# Patient Record
Sex: Female | Born: 1960
Health system: Southern US, Community
[De-identification: ages and names within clinical notes are randomized; demographics above are authoritative.]

## PROBLEM LIST (undated history)

## (undated) ENCOUNTER — Emergency Department: Discharge status: Absent without leave | Payer: Self-pay

## (undated) DIAGNOSIS — M170 Bilateral primary osteoarthritis of knee: Secondary | ICD-10-CM

## (undated) DIAGNOSIS — T7840XA Allergy, unspecified, initial encounter: Secondary | ICD-10-CM

## (undated) DIAGNOSIS — G43829 Menstrual migraine, not intractable, without status migrainosus: Secondary | ICD-10-CM

## (undated) DIAGNOSIS — J329 Chronic sinusitis, unspecified: Secondary | ICD-10-CM

## (undated) DIAGNOSIS — J45909 Unspecified asthma, uncomplicated: Secondary | ICD-10-CM

## (undated) DIAGNOSIS — K861 Other chronic pancreatitis: Secondary | ICD-10-CM

## (undated) DIAGNOSIS — L509 Urticaria, unspecified: Secondary | ICD-10-CM

## (undated) DIAGNOSIS — Z01810 Encounter for preprocedural cardiovascular examination: Secondary | ICD-10-CM

## (undated) DIAGNOSIS — R7303 Prediabetes: Secondary | ICD-10-CM

## (undated) DIAGNOSIS — K0530 Chronic periodontitis, unspecified: Secondary | ICD-10-CM

## (undated) DIAGNOSIS — I10 Essential (primary) hypertension: Secondary | ICD-10-CM

## (undated) HISTORY — DX: Encounter for preprocedural cardiovascular examination: Z01.810

## (undated) HISTORY — DX: Menstrual migraine, not intractable, without status migrainosus: G43.829

## (undated) HISTORY — DX: Chronic sinusitis, unspecified: J32.9

## (undated) HISTORY — DX: Other chronic pancreatitis: K86.1

## (undated) HISTORY — DX: Prediabetes: R73.03

## (undated) HISTORY — DX: Unspecified asthma, uncomplicated: J45.909

## (undated) HISTORY — DX: Allergy, unspecified, initial encounter: T78.40XA

## (undated) HISTORY — DX: Essential (primary) hypertension: I10

## (undated) HISTORY — DX: Bilateral primary osteoarthritis of knee: M17.0

---

## 1987-12-26 HISTORY — PX: TUBAL LIGATION: SHX77

## 2012-11-04 ENCOUNTER — Encounter (HOSPITAL_BASED_OUTPATIENT_CLINIC_OR_DEPARTMENT_OTHER): Payer: Self-pay | Admitting: *Deleted

## 2012-11-04 ENCOUNTER — Emergency Department
Admission: EM | Admit: 2012-11-04 | Discharge: 2012-11-04 | Disposition: A | Discharge status: Home / Self Care | Payer: Self-pay | Source: Home / Self Care

## 2012-11-04 ENCOUNTER — Emergency Department (HOSPITAL_BASED_OUTPATIENT_CLINIC_OR_DEPARTMENT_OTHER): Payer: Medicare Other

## 2012-11-04 ENCOUNTER — Emergency Department (HOSPITAL_BASED_OUTPATIENT_CLINIC_OR_DEPARTMENT_OTHER)
Admission: EM | Admit: 2012-11-04 | Discharge: 2012-11-04 | Disposition: A | Discharge status: Home / Self Care | Payer: Medicare Other | Attending: Emergency Medicine | Admitting: Emergency Medicine

## 2012-11-04 DIAGNOSIS — Z79899 Other long term (current) drug therapy: Secondary | ICD-10-CM | POA: Insufficient documentation

## 2012-11-04 DIAGNOSIS — R110 Nausea: Secondary | ICD-10-CM | POA: Insufficient documentation

## 2012-11-04 DIAGNOSIS — L509 Urticaria, unspecified: Secondary | ICD-10-CM | POA: Insufficient documentation

## 2012-11-04 DIAGNOSIS — R0789 Other chest pain: Secondary | ICD-10-CM | POA: Insufficient documentation

## 2012-11-04 DIAGNOSIS — R079 Chest pain, unspecified: Secondary | ICD-10-CM

## 2012-11-04 DIAGNOSIS — R61 Generalized hyperhidrosis: Secondary | ICD-10-CM | POA: Insufficient documentation

## 2012-11-04 HISTORY — DX: Urticaria, unspecified: L50.9

## 2012-11-04 LAB — CBC
HCT: 37.5 % (ref 36.0–46.0)
Hemoglobin: 12.8 g/dL (ref 12.0–15.0)
MCH: 29.8 pg (ref 26.0–34.0)
MCHC: 34.1 g/dL (ref 30.0–36.0)
MCV: 87.4 fL (ref 78.0–100.0)
Platelets: 359 10*3/uL (ref 150–400)
RBC: 4.29 MIL/uL (ref 3.87–5.11)
RDW: 14.4 % (ref 11.5–15.5)
WBC: 6.9 10*3/uL (ref 4.0–10.5)

## 2012-11-04 LAB — BASIC METABOLIC PANEL
BUN: 17 mg/dL (ref 6–23)
CO2: 25 mEq/L (ref 19–32)
Calcium: 9.3 mg/dL (ref 8.4–10.5)
Chloride: 103 mEq/L (ref 96–112)
Creatinine, Ser: 1.2 mg/dL — ABNORMAL HIGH (ref 0.50–1.10)
GFR calc Af Amer: 60 mL/min — ABNORMAL LOW (ref >90)
GFR calc non Af Amer: 52 mL/min — ABNORMAL LOW (ref >90)
Glucose, Bld: 99 mg/dL (ref 70–99)
Potassium: 4 mEq/L (ref 3.5–5.1)
Sodium: 139 mEq/L (ref 135–145)

## 2012-11-04 LAB — PREGNANCY, URINE: Preg Test, Ur: NEGATIVE

## 2012-11-04 LAB — URINALYSIS, ROUTINE W REFLEX MICROSCOPIC
Bilirubin Urine: NEGATIVE
Glucose, UA: NEGATIVE mg/dL
Ketones, ur: NEGATIVE mg/dL
Leukocytes, UA: NEGATIVE
Nitrite: NEGATIVE
Protein, ur: NEGATIVE mg/dL
Specific Gravity, Urine: 1.007 (ref 1.005–1.030)
Urobilinogen, UA: 0.2 mg/dL (ref 0.0–1.0)
pH: 6 (ref 5.0–8.0)

## 2012-11-04 LAB — URINE MICROSCOPIC-ADD ON

## 2012-11-04 LAB — TROPONIN I: Troponin I: <0.3 ng/mL (ref <0.30)

## 2012-11-04 MED ORDER — GI COCKTAIL ~~LOC~~
30.0000 mL | Freq: Once | ORAL | Status: AC
  Administered 2012-11-04: 30 mL via ORAL
  Filled 2012-11-04: qty 30

## 2012-11-04 NOTE — ED Notes (Signed)
Pt sent here from Urgent Care. C/O midsternal chest pressure x 2 weeks. Also feels same with urination and BM. Tingling left arm. Denies other s/s.

## 2012-11-04 NOTE — Discharge Instructions (Signed)
Chest Pain (Nonspecific) It is often hard to give a specific diagnosis for the cause of chest pain. There is always a chance that your pain could be related to something serious, such as a heart attack or a blood clot in the lungs. You need to follow up with your caregiver for further evaluation. CAUSES   Heartburn.  Pneumonia or bronchitis.  Anxiety or stress.  Inflammation around your heart (pericarditis) or lung (pleuritis or pleurisy).  A blood clot in the lung.  A collapsed lung (pneumothorax). It can develop suddenly on its own (spontaneous pneumothorax) or from injury (trauma) to the chest.  Shingles infection (herpes zoster virus). The chest wall is composed of bones, muscles, and cartilage. Any of these can be the source of the pain.  The bones can be bruised by injury.  The muscles or cartilage can be strained by coughing or overwork.  The cartilage can be affected by inflammation and become sore (costochondritis). DIAGNOSIS  Lab tests or other studies, such as X-rays, electrocardiography, stress testing, or cardiac imaging, may be needed to find the cause of your pain.  TREATMENT   Treatment depends on what may be causing your chest pain. Treatment may include:  Acid blockers for heartburn.  Anti-inflammatory medicine.  Pain medicine for inflammatory conditions.  Antibiotics if an infection is present.  You may be advised to change lifestyle habits. This includes stopping smoking and avoiding alcohol, caffeine, and chocolate.  You may be advised to keep your head raised (elevated) when sleeping. This reduces the chance of acid going backward from your stomach into your esophagus.  Most of the time, nonspecific chest pain will improve within 2 to 3 days with rest and mild pain medicine. HOME CARE INSTRUCTIONS   If antibiotics were prescribed, take your antibiotics as directed. Finish them even if you start to feel better.  For the next few days, avoid physical  activities that bring on chest pain. Continue physical activities as directed.  Do not smoke.  Avoid drinking alcohol.  Only take over-the-counter or prescription medicine for pain, discomfort, or fever as directed by your caregiver.  Follow your caregiver's suggestions for further testing if your chest pain does not go away.  Keep any follow-up appointments you made. If you do not go to an appointment, you could develop lasting (chronic) problems with pain. If there is any problem keeping an appointment, you must call to reschedule. SEEK MEDICAL CARE IF:   You think you are having problems from the medicine you are taking. Read your medicine instructions carefully.  Your chest pain does not go away, even after treatment.  You develop a rash with blisters on your chest. SEEK IMMEDIATE MEDICAL CARE IF:   You have increased chest pain or pain that spreads to your arm, neck, jaw, back, or abdomen.  You develop shortness of breath, an increasing cough, or you are coughing up blood.  You have severe back or abdominal pain, feel nauseous, or vomit.  You develop severe weakness, fainting, or chills.  You have a fever. THIS IS AN EMERGENCY. Do not wait to see if the pain will go away. Get medical help at once. Call your local emergency services (911 in [place] Do not drive yourself to the hospital. MAKE SURE YOU:   Understand these instructions.  Will watch your condition.  Will get help right away if you are not doing well or get worse. Document Released: 08/03/2005 Document Revised: 01/16/2012 Document Reviewed: 05/29/2008 Exit[employer] Patient Information 2013 [employer].  

## 2012-11-04 NOTE — ED Provider Notes (Signed)
History  This chart was scribed for Lyanne Co, MD by Ardeen Jourdain, ED Scribe. This patient was seen in room MH01/MH01 and the patient's care was started at 1603.  CSN: [id number]  Arrival date & time 11/04/12  1548   First MD Initiated Contact with Patient 11/04/12 1603      Chief Complaint  Patient presents with  . Chest Pain     The history is provided by the patient. No language interpreter was used.    Janet Richards is a 51 y.o. female who presents to the Emergency Department complaining of chest pressure with associated diaphoresis and nausea. She states the pain is intermittent and lasts a few seconds. She reports the pain started 2 weeks ago and has been gradually worsening. She states the pain feels "like someone is pushing on her chest" when she urinates and when she bends over. She reports the pain is also aggravated by walking, movement, sitting up and deep breaths. She does not have a family history of heart disease.    Past Medical History  Diagnosis Date  . Urticaria     History reviewed. No pertinent past surgical history.  History reviewed. No pertinent family history.  History  Substance Use Topics  . Smoking status: Never Smoker   . Smokeless tobacco: Not on file  . Alcohol Use: Yes   No OB history available.   Review of Systems  All other systems reviewed and are negative.   A complete 10 system review of systems was obtained and all systems are negative except as noted in the HPI and PMH.    Allergies  Erythromycin  Home Medications   Current Outpatient Rx  Name  Route  Sig  Dispense  Refill  . CONJ ESTROG-MEDROXYPROGEST ACE 0.625-2.5 MG PO TABS   Oral   Take 1 tablet by mouth daily.         Joyce Copa PO   Oral   Take by mouth.         [name] HYDROXYZINE HCL 10 MG PO TABS   Oral   Take 10 mg by mouth 3 (three) times daily as needed.         [name] LORATADINE 10 MG PO TABS   Oral   Take 10 mg by mouth daily.         [name]  OMEPRAZOLE 20 MG PO CPDR   Oral   Take 20 mg by mouth daily.           There were no vitals taken for this visit.  Physical Exam  Nursing note and vitals reviewed. Constitutional: She is oriented to person, place, and time. She appears well-developed and well-nourished. No distress.  HENT:  Head: Normocephalic and atraumatic.  Eyes: EOM are normal.  Neck: Normal range of motion.  Cardiovascular: Normal rate, regular rhythm and normal heart sounds.   Pulmonary/Chest: Effort normal and breath sounds normal.  Abdominal: Soft. Bowel sounds are normal. She exhibits no distension. There is no tenderness.  Musculoskeletal: Normal range of motion.  Neurological: She is alert and oriented to person, place, and time.  Skin: Skin is warm and dry.  Psychiatric: She has a normal mood and affect. Judgment normal.    ED Course  Procedures (including critical care time)  DIAGNOSTIC STUDIES:   COORDINATION OF CARE:   4:39 PM: Discussed treatment plan which includes blood work, an EKG and CXR with pt at bedside and pt agreed to plan.  Results for orders placed during  the hospital encounter of 11/04/12  URINALYSIS, ROUTINE W REFLEX MICROSCOPIC      Component Value Range   Color, Urine YELLOW  YELLOW   APPearance CLEAR  CLEAR   Specific Gravity, Urine 1.007  1.005 - 1.030   pH 6.0  5.0 - 8.0   Glucose, UA NEGATIVE  NEGATIVE mg/dL   Hgb urine dipstick LARGE (*) NEGATIVE   Bilirubin Urine NEGATIVE  NEGATIVE   Ketones, ur NEGATIVE  NEGATIVE mg/dL   Protein, ur NEGATIVE  NEGATIVE mg/dL   Urobilinogen, UA 0.2  0.0 - 1.0 mg/dL   Nitrite NEGATIVE  NEGATIVE   Leukocytes, UA NEGATIVE  NEGATIVE  PREGNANCY, URINE      Component Value Range   Preg Test, Ur NEGATIVE  NEGATIVE  URINE MICROSCOPIC-ADD ON      Component Value Range   Squamous Epithelial / LPF RARE  RARE   WBC, UA 0-2  <3 WBC/hpf   RBC / HPF 3-6  <3 RBC/hpf   Bacteria, UA RARE  RARE  CBC      Component Value Range   WBC 6.9   4.0 - 10.5 K/uL   RBC 4.29  3.87 - 5.11 MIL/uL   Hemoglobin 12.8  12.0 - 15.0 g/dL   HCT 16.1  09.6 - 04.5 %   MCV 87.4  78.0 - 100.0 fL   MCH 29.8  26.0 - 34.0 pg   MCHC 34.1  30.0 - 36.0 g/dL   RDW 40.9  81.1 - 91.4 %   Platelets 359  150 - 400 K/uL  BASIC METABOLIC PANEL      Component Value Range   Sodium 139  135 - 145 mEq/L   Potassium 4.0  3.5 - 5.1 mEq/L   Chloride 103  96 - 112 mEq/L   CO2 25  19 - 32 mEq/L   Glucose, Bld 99  70 - 99 mg/dL   BUN 17  6 - 23 mg/dL   Creatinine, Ser 7.82 (*) 0.50 - 1.10 mg/dL   Calcium 9.3  8.4 - 95.6 mg/dL   GFR calc non Af Amer 52 (*) >90 mL/min   GFR calc Af Amer 60 (*) >90 mL/min  TROPONIN I      Component Value Range   Troponin I <0.30  <0.30 ng/mL   Dg Chest 2 View  11/04/2012  *RADIOLOGY REPORT*  Clinical Data: Chest pain.  CHEST - 2 VIEW  Comparison: None  Findings: Heart and mediastinal contours are within normal limits. No focal opacities or effusions.  No acute bony abnormality.  IMPRESSION: No active cardiopulmonary disease.   Original Report Authenticated By: Charlett Nose, M.D.    I personally reviewed the imaging tests through PACS system I reviewed available ER/hospitalization records through the EMR   Date: 11/04/2012  Rate: 92  Rhythm: normal sinus rhythm  QRS Axis: normal  Intervals: normal  ST/T Wave abnormalities: normal  Conduction Disutrbances: none  Narrative Interpretation:   Old EKG Reviewed: No significant changes noted     1. Chest pain       MDM  The patient's symptoms are very atypical with intermittent chest discomfort is worse with bending over.  It seems more musculoskeletal in nature.  EKG normal.  Troponin normal.  Of all of her symptoms were resolved with GI cocktail.  Close PCP followup.  Doubt ACS.  Doubt PE. PERC negative      I personally performed the services described in this documentation, which was scribed in my presence. The  recorded information has been reviewed and is  accurate.      Lyanne Co, MD 11/04/12 [phone]

## 2012-12-22 ENCOUNTER — Encounter: Payer: Self-pay | Admitting: *Deleted

## 2012-12-22 ENCOUNTER — Emergency Department
Admission: EM | Admit: 2012-12-22 | Discharge: 2012-12-22 | Disposition: A | Discharge status: Home / Self Care | Payer: Medicare Other | Source: Home / Self Care | Attending: Emergency Medicine | Admitting: Emergency Medicine

## 2012-12-22 ENCOUNTER — Emergency Department (INDEPENDENT_AMBULATORY_CARE_PROVIDER_SITE_OTHER): Payer: Medicare Other

## 2012-12-22 DIAGNOSIS — M25569 Pain in unspecified knee: Secondary | ICD-10-CM

## 2012-12-22 DIAGNOSIS — M25561 Pain in right knee: Secondary | ICD-10-CM

## 2012-12-22 MED ORDER — PREDNISONE (PAK) 10 MG PO TABS: ORAL_TABLET | ORAL | Status: DC

## 2012-12-22 NOTE — ED Notes (Signed)
Pt has had rt Knee pain for 2 days no injury.  Pt describes pain as a ton off bricks and heavy.  Pt gives 10/10 pain.  Has used ice and heat with no relief

## 2012-12-22 NOTE — ED Provider Notes (Addendum)
History     CSN: [id number]  Arrival date & time 12/22/12  1708   First MD Initiated Contact with Patient 12/22/12 1734      Chief Complaint  Patient presents with  . Knee Pain    Rt    Patient is a 52 y.o. female presenting with knee pain. The history is provided by the patient.  Knee Pain Location:  Knee Time since incident: Recalls no injury, complains of 2 days of swollen painful right knee. Injury: no   Knee location:  R knee Pain details:    Quality:  Dull and pressure   Radiates to:  Does not radiate   Severity:  Severe   Onset quality:  Gradual   Duration:  2 days   Timing:  Constant   Progression:  Worsening Chronicity:  New (This is new for the right knee, but had similar symptoms on left knee 3 weeks ago, was given a cortisone shot by another physician in left knee 3 weeks ago and that helped her left knee.) Dislocation: no   Foreign body present:  No foreign bodies Prior injury to area:  No Relieved by:  Rest Worsened by:  Activity, bearing weight and flexion Ineffective treatments:  Ice and heat Associated symptoms: decreased ROM, stiffness and swelling   Associated symptoms: no back pain, no fever, no itching, no numbness and no tingling   Risk factors: no frequent fractures and no known bone disorder    Right knee pain is 10 out of a possible 10.-- To her knowledge, she has never been diagnosed with gout, but she feels she could have gout because she has occasional arthralgias in knees and feet and ankles for the past few years.  Drug allergies: Aspirin or any NSAID. Erythromycin. Penicillin (she states she can take amoxicillin without any problems or side effects) Past Medical History  Diagnosis Date  . Urticaria     Past Surgical History  Procedure Laterality Date  . Tubal ligation      History reviewed. No pertinent family history.  History  Substance Use Topics  . Smoking status: Never Smoker   . Smokeless tobacco: Not on file  . Alcohol  Use: Yes    OB History   Grav Para Term Preterm Abortions TAB SAB Ect Mult Living                  Review of Systems  Constitutional: Negative for fever, chills and appetite change.  HENT: Negative.   Eyes: Negative.   Respiratory: Negative.   Cardiovascular: Negative.   Gastrointestinal: Negative.   Genitourinary: Negative.   Musculoskeletal: Positive for stiffness. Negative for back pain.  Skin: Negative for itching.  Neurological: Negative.   Hematological: Negative.   Psychiatric/Behavioral: Negative.     Allergies  Aspirin and Erythromycin  Home Medications   Current Outpatient Rx  Name  Route  Sig  Dispense  Refill  . fluticasone (FLONASE) 50 MCG/ACT nasal spray   Nasal   Place 2 sprays into the nose daily.         [name] estrogen, conjugated,-medroxyprogesterone (PREMPRO) 0.625-2.5 MG per tablet   Oral   Take 1 tablet by mouth daily.         [name] Fexofenadine HCl (ALLEGRA PO)   Oral   Take by mouth.         . hydrOXYzine (ATARAX/VISTARIL) 10 MG tablet   Oral   Take 10 mg by mouth 3 (three) times daily as needed.         [name]  loratadine (CLARITIN) 10 MG tablet   Oral   Take 10 mg by mouth daily.         [name] omeprazole (PRILOSEC) 20 MG capsule   Oral   Take 20 mg by mouth daily.         . predniSONE (STERAPRED UNI-PAK) 10 MG tablet      Take as directed for 6 days.   21 tablet   0     BP 169/103  Pulse 93  Temp(Src) 98.5 F (36.9 C) (Oral)  Ht 5\' 3"  (1.6 m)  Wt 187 lb 4 oz (84.936 kg)  BMI 33.18 kg/m2  SpO2 98%  Physical Exam  Nursing note and vitals reviewed. Constitutional: She is oriented to person, place, and time. She appears well-developed and well-nourished. No distress.  Uncomfortable from right knee pain. She can weight-bear, but limps avoiding full weightbearing of right knee  HENT:  Head: Normocephalic and atraumatic.  Eyes: Conjunctivae and EOM are normal. Pupils are equal, round, and reactive to light. No scleral icterus.   Neck: Normal range of motion.  Cardiovascular: Normal rate.   Pulmonary/Chest: Effort normal.  Abdominal: She exhibits no distension.  Musculoskeletal:       Right knee: She exhibits decreased range of motion, swelling and erythema. She exhibits no ecchymosis, no laceration and no MCL laxity. Tenderness (Diffusely over right knee) found. Medial joint line tenderness noted. No patellar tendon tenderness noted.  Question of effusion right knee. The right knee is diffusely warm, but no redness or red streaks. No open wound or bleeding or drainage .  Neurological: She is alert and oriented to person, place, and time.  Skin: Skin is warm. No rash noted.  Psychiatric: She has a normal mood and affect.    ED Course  Procedures (including critical care time)  Labs Reviewed  URIC ACID  POCT CBC W AUTO DIFF ([facility])   Dg Knee Complete 4 Views Right  12/22/2012  *RADIOLOGY REPORT*  Clinical Data: Right knee pain.  No known injury.  RIGHT KNEE - COMPLETE 4+ VIEW  Comparison: None.  Findings: Early tricompartment degenerative changes.  Small joint effusion. No acute bony abnormality.  Specifically, no fracture, subluxation, or dislocation.  Soft tissues are intact.  IMPRESSION: No acute bony abnormality.   Original Report Authenticated By: Charlett Nose, M.D.      1. Medial knee pain, right       MDM  Acute medial and diffuse Right knee pain for 2 days. In the differential, most likely causes are osteoarthritis and gout. Clinically, I am not suspicious of infectious cause, as I feel that's much less likely. We reviewed X-ray findings above: early tricompartment degenerative changes and small joint effusion, otherwise no acute bony abnormality. We discussed workup and treatment options, given that she's here in urgent care on Saturday p.m.- Blood tests for uric acid drawn, results will not be available for 2 or more days. She declined any other testing at this time. Reviewed that  she cannot take aspirin or NSAIDs. After risks, benefits, alternatives discussed, prednisone 10 mg-6 day Dosepak prescribed.--Also, may take Tylenol when necessary pain. Red flags and precautions discussed. Elastic knee sleeve applied, and she stated that that significantly improved her pain. We arranged for appointment for followup with Dr. Benjamin Stain within 3-4 days (family medicine and sports medicine) to reevaluate right knee pain and BP.--She understands that  Dr. Benjamin Stain may choose to do needle aspiration and/or injection of the right knee under ultrasound guidance. Blood  test for uric acid is pending. Regarding elevated BP, BP rechecked at 150/98, I advised her to have that evaluated also at her followup visit.     Lajean Manes, MD 12/22/12 [place]  Lajean Manes, MD 12/22/12 1910

## 2012-12-23 LAB — URIC ACID: Uric Acid, Serum: 4.5 mg/dL (ref 2.4–7.0)

## 2012-12-27 ENCOUNTER — Ambulatory Visit (INDEPENDENT_AMBULATORY_CARE_PROVIDER_SITE_OTHER): Payer: Medicare Other | Admitting: Sports Medicine

## 2012-12-27 ENCOUNTER — Encounter: Payer: Self-pay | Admitting: Sports Medicine

## 2012-12-27 DIAGNOSIS — M1710 Unilateral primary osteoarthritis, unspecified knee: Secondary | ICD-10-CM

## 2012-12-27 DIAGNOSIS — L500 Allergic urticaria: Secondary | ICD-10-CM | POA: Insufficient documentation

## 2012-12-27 DIAGNOSIS — M170 Bilateral primary osteoarthritis of knee: Secondary | ICD-10-CM | POA: Insufficient documentation

## 2012-12-27 NOTE — Assessment & Plan Note (Signed)
Aspiration and injection as above. Knee was strapped with compressive bandage. Return in 4 weeks to see if things are going. Home rehabilitation exercises given.

## 2012-12-27 NOTE — Progress Notes (Signed)
  Subjective:    I'm seeing this patient as a consultation for:  Dr. Georgina Pillion  CC: Right knee pain  HPI: This is a very pleasant 52 year old female who comes in with a several year history of pain on and off in the left and right knees. She recently had her left knee injected by another provider. Over the past few weeks she has noted increasing pain and swelling in her right knee that she localizes the medial joint line without radiation. She gets grinding but no other mechanical symptoms. She's not had any benefit with current oral analgesics. Pain is moderate to severe.  Past medical history, Surgical history, Family history not pertinant except as noted below, Social history, Allergies, and medications have been entered into the medical record, reviewed, and no changes needed.   Review of Systems: No headache, visual changes, nausea, vomiting, diarrhea, constipation, dizziness, abdominal pain, skin rash, fevers, chills, night sweats, weight loss, swollen lymph nodes, body aches, joint swelling, muscle aches, chest pain, shortness of breath, mood changes, visual or auditory hallucinations.   Objective:   General: Well Developed, well nourished, and in no acute distress.  Neuro/Psych: Alert and oriented x3, extra-ocular muscles intact, able to move all 4 extremities, sensation grossly intact. Skin: Warm and dry, no rashes noted.  Respiratory: Not using accessory muscles, speaking in full sentences, trachea midline.  Cardiovascular: Pulses palpable, no extremity edema. Abdomen: Does not appear distended. Right Knee: Moderate effusion with fluid wave. Tender to palpation over the medial joint line. ROM full in flexion and extension and lower leg rotation. Ligaments with solid consistent endpoints including ACL, PCL, LCL, MCL. Negative Mcmurray's, Apley's, and Thessalonian tests. Non painful patellar compression. Patellar glide without crepitus. Patellar and quadriceps tendons  unremarkable. Hamstring and quadriceps strength is normal.   Procedure: Real-time Ultrasound Guided aspiration/injection of right knee.   Device: GE Logiq E  Ultrasound guided injection is preferred based studies that show increased duration, increased effect, greater accuracy, decreased procedural pain, increased response rate, and decreased cost with ultrasound guided versus blind injection.  Verbal informed consent obtained.  Time-out conducted.  Noted no overlying erythema, induration, or other signs of local infection.  Skin prepped in a sterile fashion.  Local anesthesia: Topical Ethyl chloride.  With sterile technique and under real time ultrasound guidance:  5 cc lidocaine without epi infiltrated into the subcutaneous tissues on the lateral suprapatellar recess. 18-gauge needle and a 60 cc syringe advanced into the suprapatellar recess, or large effusion was seen. 25 cc of straw-colored fluid was aspirated, syringe switched in 2 cc Kenalog 40, 4 cc lidocaine injected easily. Knee was then strapped with compressive dressing. Completed without difficulty  Pain immediately resolved suggesting accurate placement of the medication.  Advised to call if fevers/chills, erythema, induration, drainage, or persistent bleeding.  Images permanently stored and available for review in the ultrasound unit.  Impression: Technically successful ultrasound guided injection.  X-rays were reviewed and show mild DJD tricompartmentally. Impression and Recommendations:   This case required medical decision making of moderate complexity.

## 2013-01-13 ENCOUNTER — Emergency Department
Admission: EM | Admit: 2013-01-13 | Discharge: 2013-01-13 | Disposition: A | Discharge status: Home / Self Care | Payer: Medicare Other | Source: Home / Self Care | Attending: Family Medicine | Admitting: Family Medicine

## 2013-01-13 ENCOUNTER — Emergency Department (INDEPENDENT_AMBULATORY_CARE_PROVIDER_SITE_OTHER): Payer: Medicare Other

## 2013-01-13 DIAGNOSIS — R0602 Shortness of breath: Secondary | ICD-10-CM

## 2013-01-13 DIAGNOSIS — J069 Acute upper respiratory infection, unspecified: Secondary | ICD-10-CM

## 2013-01-13 DIAGNOSIS — M940 Chondrocostal junction syndrome [Tietze]: Secondary | ICD-10-CM

## 2013-01-13 MED ORDER — AZITHROMYCIN 250 MG PO TABS: ORAL_TABLET | ORAL | Status: DC

## 2013-01-13 NOTE — ED Notes (Signed)
States she has had sinus pressure and right ear pain x 2 weeks ago.  Also states that it may be pain from her tooth on the bottom right side d/t two cavities in that tooth

## 2013-01-13 NOTE — ED Provider Notes (Signed)
History     CSN: [id number]  Arrival date & time 01/13/13  1448   First MD Initiated Contact with Patient 01/13/13 1451      Chief Complaint  Patient presents with  . Facial Pain  . Otalgia   HPI  URI Symptoms  Onset: 2 weeks  Description: sinus pressure, nasal congestion, cough, mild SOB and pain with deep breathing.  Modifying factors:  Recently took extended trip to [facility] and back. No prior hx/o blood clots. R sided CP with R arm movement and deep breathing.    Symptoms Nasal discharge: yes Fever: no Sore throat: no Cough: yes Wheezing: no Ear pain: yes GI symptoms: no Sick contacts: no  Red Flags  Stiff neck: no Dyspnea: yes Rash: no Swallowing difficulty: no  Sinusitis Risk Factors Headache/face pain: yes Double sickening: no tooth pain: yes; is pending dentistry follow up   Allergy Risk Factors Sneezing: yes Itchy scratchy throat: no Seasonal symptoms: yes  Flu Risk Factors Headache: mild muscle aches: no severe fatigue: no   Past Medical History  Diagnosis Date  . Urticaria     Past Surgical History  Procedure Laterality Date  . Tubal ligation      Family History  Problem Relation Age of Onset  . Hypertension Mother     History  Substance Use Topics  . Smoking status: Never Smoker   . Smokeless tobacco: Not on file  . Alcohol Use: Yes    OB History   Grav Para Term Preterm Abortions TAB SAB Ect Mult Living                  Review of Systems  All other systems reviewed and are negative.    Allergies  Aspirin; Erythromycin; Nsaids; and Penicillins  Home Medications   Current Outpatient Rx  Name  Route  Sig  Dispense  Refill  . azithromycin (ZITHROMAX) 250 MG tablet      Take 2 tabs PO x 1 dose, then 1 tab PO QD x 4 days   6 tablet   0   . estrogen, conjugated,-medroxyprogesterone (PREMPRO) 0.625-2.5 MG per tablet   Oral   Take 1 tablet by mouth daily.         [name] Fexofenadine HCl (ALLEGRA PO)   Oral  Take by mouth.         . fluticasone (FLONASE) 50 MCG/ACT nasal spray   Nasal   Place 2 sprays into the nose daily.         . hydrOXYzine (ATARAX/VISTARIL) 10 MG tablet   Oral   Take 10 mg by mouth 3 (three) times daily as needed.         . loratadine (CLARITIN) 10 MG tablet   Oral   Take 10 mg by mouth daily.         [name] omeprazole (PRILOSEC) 20 MG capsule   Oral   Take 20 mg by mouth daily.         . predniSONE (STERAPRED UNI-PAK) 10 MG tablet      Take as directed for 6 days.   21 tablet   0     BP 134/84  Pulse 102  Temp(Src) 97.9 F (36.6 C) (Oral)  Ht 5\' 3"  (1.6 m)  Wt 188 lb (85.276 kg)  BMI 33.31 kg/m2  SpO2 97%  LMP 12/27/2012  Physical Exam  Constitutional: She appears well-developed and well-nourished.  HENT:  Head: Normocephalic and atraumatic.  Mild R ear canal erythema and tenderness to  otoscopic evaluation +nasal erythema, rhinorrhea bilaterally, + post oropharyngeal erythema    Eyes: Conjunctivae are normal. Pupils are equal, round, and reactive to light.  Neck: Normal range of motion.  Cardiovascular: Normal rate, regular rhythm and normal heart sounds.   Pulmonary/Chest: Effort normal. She has no wheezes. She has no rales.  + anterior chest wall TTP    Abdominal: Soft.  Musculoskeletal: Normal range of motion.  Neurological: She is alert.  Skin: Skin is warm.    ED Course  Procedures (including critical care time)  Labs Reviewed  D-DIMER, QUANTITATIVE   Dg Chest 2 View  01/13/2013  *RADIOLOGY REPORT*  Clinical Data: Shortness of breath.  CHEST - 2 VIEW  Comparison: PA and lateral chest 11/04/2012.  Findings: Lungs are clear.  Heart size is normal.  No pneumothorax or pleural effusion.  IMPRESSION: No acute disease.   Original Report Authenticated By: Holley Dexter, M.D.      1. URI (upper respiratory infection)   2. Costochondritis   3. SOB (shortness of breath)       MDM  Will place on Z-Pak for infectious  coverage. Noted anterior chest wall tightness palpation exam consistent with costochondritis. Discussed supportive care for this. CXR negative for acute infiltrate.  Given the patient does have some pleuritic chest pain as well as shortness of breath in the setting of an extended trip. Will scores at 1.5. Will check a d-dimer to rule out DVT as source of symptoms although this is low on the differential diagnosis. Currently satting well today. Mild tachycardia on exam which is also a risk factor. Plan for followup with PCP next 5-7 days.    The patient and/or caregiver has been counseled thoroughly with regard to treatment plan and/or medications prescribed including dosage, schedule, interactions, rationale for use, and possible side effects and they verbalize understanding. Diagnoses and expected course of recovery discussed and will return if not improved as expected or if the condition worsens. Patient and/or caregiver verbalized understanding.   Clinical update:  Lab Results  Component Value Date   DDIMER 0.27 01/13/2013   Pt informed of normal result.               Doree Albee, MD 01/14/13 [phone]

## 2013-01-14 ENCOUNTER — Telehealth: Payer: Self-pay | Admitting: *Deleted

## 2013-01-14 LAB — D-DIMER, QUANTITATIVE: D-Dimer, Quant: 0.27 ug/mL-FEU (ref 0.00–0.48)

## 2013-01-24 ENCOUNTER — Encounter: Payer: Self-pay | Admitting: Sports Medicine

## 2013-01-24 ENCOUNTER — Ambulatory Visit (INDEPENDENT_AMBULATORY_CARE_PROVIDER_SITE_OTHER): Payer: Medicare Other | Admitting: Sports Medicine

## 2013-01-24 VITALS — BP 154/96 | HR 89 | Wt 186.0 lb

## 2013-01-24 DIAGNOSIS — M170 Bilateral primary osteoarthritis of knee: Secondary | ICD-10-CM

## 2013-01-24 DIAGNOSIS — M1710 Unilateral primary osteoarthritis, unspecified knee: Secondary | ICD-10-CM

## 2013-01-24 NOTE — Progress Notes (Signed)
  Subjective:    CC: Follow up  HPI: Bilateral knee osteoarthritis:  Pain-free now after injections into both sides.  Past medical history, Surgical history, Family history not pertinant except as noted below, Social history, Allergies, and medications have been entered into the medical record, reviewed, and no changes needed.   Review of Systems: No headache, visual changes, nausea, vomiting, diarrhea, constipation, dizziness, abdominal pain, skin rash, fevers, chills, night sweats, weight loss, swollen lymph nodes, body aches, joint swelling, muscle aches, chest pain, shortness of breath, mood changes, visual or auditory hallucinations.   Objective:   General: Well Developed, well nourished, and in no acute distress.  Neuro/Psych: Alert and oriented x3, extra-ocular muscles intact, able to move all 4 extremities, sensation grossly intact. Skin: Warm and dry, no rashes noted.  Respiratory: Not using accessory muscles, speaking in full sentences, trachea midline.  Cardiovascular: Pulses palpable, no extremity edema. Abdomen: Does not appear distended. Bilateral knee: Normal to inspection with no erythema or effusion or obvious bony abnormalities. Palpation normal with no warmth, joint line tenderness, patellar tenderness, or condyle tenderness. ROM full in flexion and extension and lower leg rotation. Ligaments with solid consistent endpoints including ACL, PCL, LCL, MCL. Negative Mcmurray's, Apley's, and Thessalonian tests. Non painful patellar compression. Patellar glide without crepitus. Patellar and quadriceps tendons unremarkable. Hamstring and quadriceps strength is normal.  Impression and Recommendations:   This case required medical decision making of moderate complexity.

## 2013-01-24 NOTE — Assessment & Plan Note (Signed)
Resolved intra-articular injection. Continue rehabilitation exercises. Return as needed.

## 2013-02-05 ENCOUNTER — Encounter: Payer: Self-pay | Admitting: Family Medicine

## 2013-02-05 ENCOUNTER — Other Ambulatory Visit: Payer: Self-pay | Admitting: Family Medicine

## 2013-02-05 ENCOUNTER — Ambulatory Visit (INDEPENDENT_AMBULATORY_CARE_PROVIDER_SITE_OTHER): Payer: Medicare Other | Admitting: Family Medicine

## 2013-02-05 VITALS — BP 157/91 | HR 88 | Ht 63.0 in | Wt 184.0 lb

## 2013-02-05 DIAGNOSIS — R079 Chest pain, unspecified: Secondary | ICD-10-CM

## 2013-02-05 DIAGNOSIS — D62 Acute posthemorrhagic anemia: Secondary | ICD-10-CM

## 2013-02-05 DIAGNOSIS — N926 Irregular menstruation, unspecified: Secondary | ICD-10-CM

## 2013-02-05 DIAGNOSIS — N939 Abnormal uterine and vaginal bleeding, unspecified: Secondary | ICD-10-CM

## 2013-02-05 DIAGNOSIS — IMO0001 Reserved for inherently not codable concepts without codable children: Secondary | ICD-10-CM

## 2013-02-05 DIAGNOSIS — R030 Elevated blood-pressure reading, without diagnosis of hypertension: Secondary | ICD-10-CM

## 2013-02-05 DIAGNOSIS — K047 Periapical abscess without sinus: Secondary | ICD-10-CM | POA: Insufficient documentation

## 2013-02-05 DIAGNOSIS — G43829 Menstrual migraine, not intractable, without status migrainosus: Secondary | ICD-10-CM

## 2013-02-05 LAB — POCT HEMOGLOBIN: Hemoglobin: 13.7 g/dL (ref 12.2–16.2)

## 2013-02-05 MED ORDER — CLINDAMYCIN HCL 150 MG PO CAPS: 150.0000 mg | ORAL_CAPSULE | Freq: Four times a day (QID) | ORAL | Status: DC

## 2013-02-05 NOTE — Progress Notes (Signed)
CC: Janet Richards is a 52 y.o. female is here for Establish Care and Vaginal Bleeding   Subjective: HPI:  Patient presents to establish care  Patient reports vaginal bleeding for the past week. She has trouble quantifying how many pads a day she's been using but she thinks it may be up to 7. She denies any pelvic trauma but does have frequent menstrual cramp type pain in the pelvis since bleeding occurred. She reports starting Prempro about a year ago for headaches and irritability that started whenever she came on her period.  She was taken this on a daily basis up until Wink missed one week then begin every other day administration. Since that time she has had irregular periods with shortening duration between menses and increasing duration of active bleeding. She reports having a Pap smear well within the last year without history of abnormalities. She complains of trouble losing weight and approximately 20 pound weight gain over the past approximately 3 months. She believes it's been well over a year since her thyroid was checked. She's had a tubal ligation.  Patient complain to dental pain on a right lower molar. She has been unable to see a dentist due to financial issues/insurance. Pain is been present for about a month worsening this past week. Described as a moderate soreness is nonradiating it is worse with chewing. She denies recent or remote trauma to the tooth. No interventions as of yet. Nothing else seems to make better or worse  Patient complains of needing to go to the emergency room back in December for chest pain. EKG and labs were unremarkable and not suggestive of cardiac events. Since that time she has intermittent chest pain usually with exertion that is sharp in the center of the chest and nonradiating. Family history positive for cardiac disease, patient unsure about early cardiac disease. Pain occasionally returns with exertion but also with rest, does not particularly  improve with rest. Episodes usually last minutes to hours without particular precipitators  Review of Systems - General ROS: negative for - chills, fever, night sweats,  weight loss Ophthalmic ROS: negative for - decreased vision Psychological ROS: negative for - anxiety or depression ENT ROS: negative for - hearing change, nasal congestion, tinnitus or allergies Hematological and Lymphatic ROS: negative for - history of bleeding problems, bruising or swollen lymph nodes Breast ROS: negative Respiratory ROS: no cough, shortness of breath, or wheezing Cardiovascular ROS: no dyspnea on exertion Gastrointestinal ROS: no abdominal pain, change in bowel habits, or black or bloody stools Genito-Urinary ROS: negative for - genital discharge, genital ulcers, incontinence  Musculoskeletal ROS: negative for - joint pain or muscle pain Neurological ROS: negative for - weakness or memory loss Dermatological ROS: negative for lumps, mole changes, rash and skin lesion changes  Past Medical History  Diagnosis Date  . Urticaria      Family History  Problem Relation Age of Onset  . Hypertension Mother      History  Substance Use Topics  . Smoking status: Former Smoker    Quit date: 02/05/2006  . Smokeless tobacco: Not on file  . Alcohol Use: Yes     Objective: Filed Vitals:   02/05/13 1438  BP: 157/91  Pulse: 88    General: Alert and Oriented, No Acute Distress HEENT: Pupils equal, round, reactive to light. Conjunctivae clear.  External ears unremarkable, canals clear with intact TMs with appropriate landmarks.  Middle ear appears open without effusion. Pink inferior turbinates.  Moist mucous membranes, pharynx  without inflammation nor lesions.  Neck supple without palpable lymphadenopathy nor abnormal masses. Poor dentition with moderate dental decay at the base of right lower posterior-most molar with gingival erythema and tenderness Lungs: Clear to auscultation bilaterally, no  wheezing/ronchi/rales.  Comfortable work of breathing. Good air movement. Cardiac: Regular rate and rhythm. Normal S1/S2.  No murmurs, rubs, nor gallops.   Abdomen: Obese soft nontender Extremities: No peripheral edema.  Strong peripheral pulses.  Mental Status: No depression, anxiety, nor agitation. Skin: Warm and dry.  Assessment & Plan: Janet Richards was seen today for establish care and vaginal bleeding.  Diagnoses and associated orders for this visit:  Acute blood loss anemia - POCT hemoglobin  Elevated blood pressure - Basic Metabolic Panel (BMET)  Menstrual migraine  Abnormal uterine bleeding - US Transvaginal Non-OB; Future - TSH - Prolactin - CBC  Dental abscess - clindamycin (CLEOCIN) 150 MG capsule; Take 1 capsule (150 mg total) by mouth 4 (four) times daily.  Exertional chest pain - Ambulatory referral to Cardiology  Other Orders - albuterol (PROVENTIL HFA;VENTOLIN HFA) 108 (90 BASE) MCG/ACT inhaler; Inhale 2 puffs into the lungs every 6 (six) hours as needed for wheezing. - Chia Oil (CHIA SEED OIL EXTRACT) 1000 MG CAPS; Take by mouth. - Multiple Vitamins-Minerals (MULTIVITAMIN PO); Take by mouth.    Abnormal uterine bleeding: Hemoglobin of 13.7 reassuring. Will rule out TSH and prolactin abnormalities. I've asked her to stop the Prempro until blood work and transvaginal ultrasound results are available. Dental abscess: Start clindamycin, encourage patient to establish care with local dentist as soon as possible Exertional chest pain : Given history of chest pain with exertion Will help schedule stress test Elevated blood pressure: Discussed need for antihypertensive regimen if blood pressure not improved after diet and exercise interventions discussed today   Return in about 4 weeks (around 03/05/2013).

## 2013-02-06 ENCOUNTER — Telehealth: Payer: Self-pay | Admitting: *Deleted

## 2013-02-06 DIAGNOSIS — K047 Periapical abscess without sinus: Secondary | ICD-10-CM

## 2013-02-06 LAB — BASIC METABOLIC PANEL
BUN: 11 mg/dL (ref 6–23)
CO2: 25 mEq/L (ref 19–32)
Calcium: 9.5 mg/dL (ref 8.4–10.5)
Chloride: 103 mEq/L (ref 96–112)
Creat: 0.83 mg/dL (ref 0.50–1.10)
Glucose, Bld: 86 mg/dL (ref 70–99)
Potassium: 4.1 mEq/L (ref 3.5–5.3)
Sodium: 139 mEq/L (ref 135–145)

## 2013-02-06 LAB — CBC
HCT: 38.7 % (ref 36.0–46.0)
Hemoglobin: 12.8 g/dL (ref 12.0–15.0)
MCH: 29.9 pg (ref 26.0–34.0)
MCHC: 33.1 g/dL (ref 30.0–36.0)
MCV: 90.4 fL (ref 78.0–100.0)
Platelets: 441 10*3/uL — ABNORMAL HIGH (ref 150–400)
RBC: 4.28 MIL/uL (ref 3.87–5.11)
RDW: 15.5 % (ref 11.5–15.5)
WBC: 6.7 10*3/uL (ref 4.0–10.5)

## 2013-02-06 LAB — PROLACTIN: Prolactin: 10.6 ng/mL

## 2013-02-06 LAB — TSH: TSH: 2.48 u[IU]/mL (ref 0.350–4.500)

## 2013-02-06 MED ORDER — METRONIDAZOLE 500 MG PO TABS: 500.0000 mg | ORAL_TABLET | Freq: Three times a day (TID) | ORAL | Status: AC

## 2013-02-06 NOTE — Telephone Encounter (Signed)
Sue Lush, Will you please let Mrs. Hegwood know that I've sent a substitution antibiotic to her pharmacy.  Most common side effects are a mild metallic taste in the mouth while taking it and a sick feeling if alcohol is consumed while taking it.  I'll add clindamycin to her allergy list, if she has any tounge swelling or trouble breathing from the clindamycin go to ER immediately.

## 2013-02-06 NOTE — Telephone Encounter (Signed)
Pt called and states the abx rx'ed yesterday made her face swell. Needs something else sent to pharm

## 2013-02-06 NOTE — Telephone Encounter (Signed)
Called and notified pt

## 2013-02-07 ENCOUNTER — Ambulatory Visit (HOSPITAL_BASED_OUTPATIENT_CLINIC_OR_DEPARTMENT_OTHER)
Admission: RE | Admit: 2013-02-07 | Discharge: 2013-02-07 | Disposition: A | Discharge status: Home / Self Care | Payer: Medicare Other | Source: Ambulatory Visit | Attending: Family Medicine | Admitting: Family Medicine

## 2013-02-07 DIAGNOSIS — N938 Other specified abnormal uterine and vaginal bleeding: Secondary | ICD-10-CM | POA: Insufficient documentation

## 2013-02-07 DIAGNOSIS — D259 Leiomyoma of uterus, unspecified: Secondary | ICD-10-CM | POA: Insufficient documentation

## 2013-02-07 DIAGNOSIS — N925 Other specified irregular menstruation: Secondary | ICD-10-CM | POA: Insufficient documentation

## 2013-02-07 DIAGNOSIS — N939 Abnormal uterine and vaginal bleeding, unspecified: Secondary | ICD-10-CM

## 2013-02-07 DIAGNOSIS — N949 Unspecified condition associated with female genital organs and menstrual cycle: Secondary | ICD-10-CM | POA: Insufficient documentation

## 2013-02-22 ENCOUNTER — Other Ambulatory Visit: Payer: Self-pay | Admitting: Physician Assistant

## 2013-02-22 MED ORDER — PREDNISONE 50 MG PO TABS: ORAL_TABLET | ORAL | Status: DC

## 2013-02-22 NOTE — Progress Notes (Signed)
Called by nurse hotline. Pt having on and off urticaria. She has a history of rash. Not aware of trigger. Taking benadryl and Vistaril but makes her really sleepy does help rash. Tongue feels midly swollen but benadryl helps. No epi pen. Sent steroid to pharm. If tongue/lips swell needs to call EMS.

## 2013-03-01 ENCOUNTER — Encounter: Payer: Self-pay | Admitting: Family Medicine

## 2013-03-01 ENCOUNTER — Ambulatory Visit (INDEPENDENT_AMBULATORY_CARE_PROVIDER_SITE_OTHER): Payer: Medicare Other | Admitting: Family Medicine

## 2013-03-01 VITALS — BP 164/92 | HR 96 | Wt 186.0 lb

## 2013-03-01 DIAGNOSIS — N926 Irregular menstruation, unspecified: Secondary | ICD-10-CM

## 2013-03-01 DIAGNOSIS — N939 Abnormal uterine and vaginal bleeding, unspecified: Secondary | ICD-10-CM

## 2013-03-01 DIAGNOSIS — I10 Essential (primary) hypertension: Secondary | ICD-10-CM

## 2013-03-01 DIAGNOSIS — L509 Urticaria, unspecified: Secondary | ICD-10-CM

## 2013-03-01 HISTORY — DX: Essential (primary) hypertension: I10

## 2013-03-01 MED ORDER — PREDNISONE 20 MG PO TABS: ORAL_TABLET | ORAL | Status: AC

## 2013-03-01 MED ORDER — HYDROCHLOROTHIAZIDE 25 MG PO TABS: 25.0000 mg | ORAL_TABLET | Freq: Every day | ORAL | Status: DC

## 2013-03-01 NOTE — Progress Notes (Signed)
CC: Janet Richards is a 52 y.o. female is here for f/u bleeding and Abrasion   Subjective: HPI:  Followup abnormal uterine bleeding: Patient reports since stopping oral hormonal contraceptives completely she has not had any vaginal bleeding.  We were suspicious that your regular dosing was the culprit however a pelvic ultrasound was reassuring but we did discuss possibility of adenomyosis and fibroid contributing to painful menses. She has no genitourinary complaints today  Followup elevated blood pressure: She's never taken antihypertensive therapy medication. No outside blood pressures to report. Denies headaches, vision changes, motor or sensory disturbances, chest pain, orthopnea, peripheral edema nor irregular heartbeat. She does have a history of exertional chest pain and has cardiology followup in 2 weeks.  She tells me over the weekend she had hives that broke out all over her body. She tells me this is a typical presentation of her idiopathic urticaria diagnosis that was given to her by her former allergist in [place]. On-call physician provided her with 5 days of prednisone which she stopped yesterday. She tells me that in the past she usually takes the prednisone longer due to her history of representation of symptoms if not more than a week of prednisone. She denies any skin changes or itching currently. She denies recent or remote flushing, trouble breathing, rapid heart beat. She's not sure what triggered this episode, it was atypical only in the sense of numbness of her tongue   Review Of Systems Outlined In HPI  Past Medical History  Diagnosis Date  . Urticaria   . Essential hypertension, benign 03/01/2013     Family History  Problem Relation Age of Onset  . Hypertension Mother      History  Substance Use Topics  . Smoking status: Former Smoker    Quit date: 02/05/2006  . Smokeless tobacco: Not on file  . Alcohol Use: Yes     Objective: Filed Vitals:   03/01/13 0956  BP: 164/92  Pulse: 96    General: Alert and Oriented, No Acute Distress HEENT: Pupils equal, round, reactive to light. Conjunctivae clear.  Moist mucous membranes with no palpable lymphadenopathy in the neck and unremarkable pharynx Lungs: Clear to auscultation bilaterally, no wheezing/ronchi/rales.  Comfortable work of breathing. Good air movement. Cardiac: Regular rate and rhythm. Normal S1/S2.  No murmurs, rubs, nor gallops.   Abdomen: Obese soft nontender Extremities: No peripheral edema.  Strong peripheral pulses.  Mental Status: No depression, anxiety, nor agitation. Skin: Warm and dry.  Assessment & Plan: Janet Richards was seen today for f/u bleeding and abrasion.  Diagnoses and associated orders for this visit:  Abnormal uterine bleeding  Essential hypertension, benign - hydrochlorothiazide (HYDRODIURIL) 25 MG tablet; Take 1 tablet (25 mg total) by mouth daily.  Urticaria - predniSONE (DELTASONE) 20 MG tablet; Three tabs daily days 1-3, two tabs daily days 4-6, one tab daily days 7-9, half tab daily days 10-13.    Abnormal uterine bleeding: Resolved and controlled she is in agreement of stopping oral hormonal therapy indefinitely. I've asked her to keep track of her menstrual cycle that may have some irregularity in the coming months do to recovery of pituitary ovarian axis. Essential hypertension: Discussed formal diagnosis the patient today and interventions including hydrochlorothiazide and a low-sodium diet. Discussed benefits of physical activity and return in 4 weeks Urticaria: Sounds like she would benefit from a taper rather than a burst of steroids and was provided a regimen today  Return in about 4 weeks (around 03/29/2013).

## 2013-03-06 ENCOUNTER — Encounter: Payer: Self-pay | Admitting: Cardiology

## 2013-03-06 ENCOUNTER — Encounter: Payer: Self-pay | Admitting: *Deleted

## 2013-03-13 ENCOUNTER — Encounter: Payer: Medicare Other | Admitting: Cardiology

## 2013-03-13 NOTE — Progress Notes (Signed)
  HPI: 52 year old female for evaluation of chest pain. D-dimer in March of 2014 normal. TSH, renal function and hemoglobin in April of 2014 normal.  Current Outpatient Prescriptions  Medication Sig Dispense Refill  . albuterol (PROVENTIL HFA;VENTOLIN HFA) 108 (90 BASE) MCG/ACT inhaler Inhale 2 puffs into the lungs every 6 (six) hours as needed for wheezing.      Burton Apley Oil (CHIA SEED OIL EXTRACT) 1000 MG CAPS Take by mouth.      . Fexofenadine HCl (ALLEGRA PO) Take by mouth.      . fluticasone (FLONASE) 50 MCG/ACT nasal spray Place 2 sprays into the nose daily.      . hydrochlorothiazide (HYDRODIURIL) 25 MG tablet Take 1 tablet (25 mg total) by mouth daily.  30 tablet  3  . hydrOXYzine (ATARAX/VISTARIL) 10 MG tablet Take 10 mg by mouth 3 (three) times daily as needed.      . loratadine (CLARITIN) 10 MG tablet Take 10 mg by mouth daily.      . Multiple Vitamins-Minerals (MULTIVITAMIN PO) Take by mouth.      [name] omeprazole (PRILOSEC) 20 MG capsule Take 20 mg by mouth daily.      . predniSONE (DELTASONE) 20 MG tablet Three tabs daily days 1-3, two tabs daily days 4-6, one tab daily days 7-9, half tab daily days 10-13.  20 tablet  0   No current facility-administered medications for this visit.    Allergies  Allergen Reactions  . Aspirin   . Clindamycin/Lincomycin     Facial swelling  . Erythromycin Nausea And Vomiting    Has taken azithromycin with no issues in the past.   . Nsaids Swelling  . Penicillins Swelling    Past Medical History  Diagnosis Date  . Urticaria   . Essential hypertension, benign   . Primary osteoarthritis of both knees   . Exertional chest pain   . Menstrual migraine     Past Surgical History  Procedure Laterality Date  . Tubal ligation  12/26/1987    History   Social History  . Marital Status: Married    Spouse Name: N/A    Number of Children: N/A  . Years of Education: N/A   Occupational History  . Not on file.   Social History Main Topics  .  Smoking status: Former Smoker    Quit date: 02/05/2006  . Smokeless tobacco: Not on file  . Alcohol Use: Yes  . Drug Use: No  . Sexually Active: Not Currently   Other Topics Concern  . Not on file   Social History Narrative  . No narrative on file    Family History  Problem Relation Age of Onset  . Hypertension Mother     ROS: no fevers or chills, productive cough, hemoptysis, dysphasia, odynophagia, melena, hematochezia, dysuria, hematuria, rash, seizure activity, orthopnea, PND, pedal edema, claudication. Remaining systems are negative.  Physical Exam:   There were no vitals taken for this visit.  General:  Well developed/well nourished in NAD Skin warm/dry Patient not depressed No peripheral clubbing Back-normal HEENT-normal/normal eyelids Neck supple/normal carotid upstroke bilaterally; no bruits; no JVD; no thyromegaly chest - CTA/ normal expansion CV - RRR/normal S1 and S2; no murmurs, rubs or gallops;  PMI nondisplaced Abdomen -NT/ND, no HSM, no mass, + bowel sounds, no bruit 2+ femoral pulses, no bruits Ext-no edema, chords, 2+ DP Neuro-grossly nonfocal  ECG    This encounter was created in error - please disregard.

## 2013-03-27 ENCOUNTER — Ambulatory Visit (INDEPENDENT_AMBULATORY_CARE_PROVIDER_SITE_OTHER): Payer: Medicare Other | Admitting: Family Medicine

## 2013-03-27 ENCOUNTER — Encounter: Payer: Self-pay | Admitting: Family Medicine

## 2013-03-27 VITALS — BP 112/62 | HR 90 | Wt 184.0 lb

## 2013-03-27 DIAGNOSIS — R079 Chest pain, unspecified: Secondary | ICD-10-CM

## 2013-03-27 DIAGNOSIS — K047 Periapical abscess without sinus: Secondary | ICD-10-CM

## 2013-03-27 DIAGNOSIS — I10 Essential (primary) hypertension: Secondary | ICD-10-CM

## 2013-03-27 DIAGNOSIS — N939 Abnormal uterine and vaginal bleeding, unspecified: Secondary | ICD-10-CM

## 2013-03-27 DIAGNOSIS — N926 Irregular menstruation, unspecified: Secondary | ICD-10-CM

## 2013-03-27 MED ORDER — HYDROCHLOROTHIAZIDE 25 MG PO TABS: 25.0000 mg | ORAL_TABLET | Freq: Every day | ORAL | Status: DC

## 2013-03-27 MED ORDER — METRONIDAZOLE 500 MG PO TABS: 500.0000 mg | ORAL_TABLET | Freq: Three times a day (TID) | ORAL | Status: DC

## 2013-03-27 NOTE — Progress Notes (Signed)
CC: Janet Richards is a 52 y.o. female is here for Hypertension   Subjective: HPI:  Followup essential hypertension: At her last visit started hydrochlorothiazide has no outside blood pressures to report. Denies known side effects. Denies chest pain, headaches, motor or sensory disturbances, lightheadedness, cramping, weakness, shortness of breath, orthopnea, peripheral edema  Followup abnormal uterine bleeding: Since stopping oral hormonal therapy for one and one half months has noticed more predictable vaginal bleeding resembling a pattern of formal menses cycles. Denies intramenstrual spotting, pelvic pain, vaginal discharge.  Followup exertional chest pain: She did not go to her cardiology referral due to a sickness but notes no longer having any chest discomfort whatsoever. Chest discomfort resolved after treatment of dental abscess early last month. She's able to break a sweat without chest discomfort, jaw claudication, diaphoresis, nor extremity pain.  Patient complains of right lower tooth pain present for 5 days. Finances prevented her from having this removed last month. Pain is described as sharp moderate in severity worse with chewing. Improves with rest. Nonradiating. Present all hours of the day but does not wake from sleep. Has mild chills but no fevers, fatigue, night sweats. Denies swollen lymph nodes, trouble swallowing, headaches    Review Of Systems Outlined In HPI  Past Medical History  Diagnosis Date  . Urticaria   . Essential hypertension, benign   . Primary osteoarthritis of both knees   . Exertional chest pain   . Menstrual migraine      Family History  Problem Relation Age of Onset  . Hypertension Mother      History  Substance Use Topics  . Smoking status: Former Smoker    Quit date: 02/05/2006  . Smokeless tobacco: Not on file  . Alcohol Use: Yes     Objective: Filed Vitals:   03/27/13 1106  BP: 112/62  Pulse: 90    General: Alert and  Oriented, No Acute Distress HEENT: Pupils equal, round, reactive to light. Conjunctivae clear.  External ears unremarkable, canals clear with intact TMs with appropriate landmarks.Moist mucous membranes, pharynx without inflammation nor lesions.  Neck supple without palpable lymphadenopathy nor abnormal masses. Poor dentition with dental decay of the right posterior inferior molar Lungs: Clear to auscultation bilaterally, no wheezing/ronchi/rales.  Comfortable work of breathing. Good air movement. Cardiac: Regular rate and rhythm. Normal S1/S2.  No murmurs, rubs, nor gallops.   Extremities: No peripheral edema.  Strong peripheral pulses.  Mental Status: No depression, anxiety, nor agitation. Skin: Warm and dry.  Assessment & Plan: Carlis was seen today for hypertension.  Diagnoses and associated orders for this visit:  Dental abscess - metroNIDAZOLE (FLAGYL) 500 MG tablet; Take 1 tablet (500 mg total) by mouth 3 (three) times daily. For dental infection.  Essential hypertension, benign - hydrochlorothiazide (HYDRODIURIL) 25 MG tablet; Take 1 tablet (25 mg total) by mouth daily.  Exertional chest pain  Abnormal uterine bleeding    Dental abscess: Restart metronidazole, encouraged to get a dentist as soon as possible to have removed Essential hypertension: Controlled chronic condition, continue hydrochlorothiazide Exertional chest pain: Controlled however I've encouraged her to reschedule her cardiology referral to determine if stress test is warranted Abnormal uterine bleeding: Improving control, no change to med regimen  Return in about 3 months (around 06/27/2013).

## 2013-03-28 ENCOUNTER — Encounter: Payer: Self-pay | Admitting: Family Medicine

## 2013-03-28 DIAGNOSIS — Z789 Other specified health status: Secondary | ICD-10-CM

## 2013-03-28 HISTORY — DX: Other specified health status: Z78.9

## 2013-03-29 ENCOUNTER — Ambulatory Visit: Payer: Medicare Other | Admitting: Family Medicine

## 2013-03-29 ENCOUNTER — Telehealth: Payer: Self-pay | Admitting: *Deleted

## 2013-03-29 MED ORDER — PREDNISONE 20 MG PO TABS: ORAL_TABLET | ORAL | Status: AC

## 2013-03-29 NOTE — Telephone Encounter (Signed)
Rx sent to [facility] on [address]

## 2013-03-29 NOTE — Telephone Encounter (Signed)
Pt called and left a messsage that she does need more prednisone called into her pharm. She states you guys had talked about it the other day at her appt and to call if she needed it. I assume this is for the Urticaria.Please advise

## 2013-03-29 NOTE — Telephone Encounter (Signed)
Pt.notified

## 2013-04-08 ENCOUNTER — Other Ambulatory Visit: Payer: Self-pay | Admitting: *Deleted

## 2013-04-08 MED ORDER — OMEPRAZOLE 20 MG PO CPDR: 20.0000 mg | DELAYED_RELEASE_CAPSULE | Freq: Every day | ORAL | Status: DC

## 2013-04-11 ENCOUNTER — Telehealth: Payer: Self-pay | Admitting: *Deleted

## 2013-04-11 DIAGNOSIS — M549 Dorsalgia, unspecified: Secondary | ICD-10-CM

## 2013-04-11 MED ORDER — TRAMADOL HCL 50 MG PO TABS: 50.0000 mg | ORAL_TABLET | Freq: Four times a day (QID) | ORAL | Status: DC | PRN

## 2013-04-11 NOTE — Telephone Encounter (Signed)
She may want to consider coming in for an evaluation on whether or not an xray is needed.  Until that time I'll call in an rx of tramadol since i know she can't take NSAIDS.  Please let her know that tramadol is not a narcotic like morphine but does weakly act on the same pain receptors that morphine acts on.

## 2013-04-11 NOTE — Telephone Encounter (Signed)
Pt called and states she "threw her back out." She is in a lot of pain coming from her lower back. She wants advice as  To what she should do

## 2013-04-11 NOTE — Telephone Encounter (Signed)
Pt notified; pt states she does not want to take tramadol because any medication like that makes her feel funny and says she'd rather not take it. Pt agreed to scheduling an appt and will call back at 1 since we are at lunch to get on Dr. Karie Schwalbe schedule

## 2013-04-12 ENCOUNTER — Institutional Professional Consult (permissible substitution): Payer: Medicare Other | Admitting: Sports Medicine

## 2013-04-15 ENCOUNTER — Encounter: Payer: Self-pay | Admitting: Sports Medicine

## 2013-04-15 ENCOUNTER — Other Ambulatory Visit: Payer: Self-pay | Admitting: Sports Medicine

## 2013-04-15 ENCOUNTER — Institutional Professional Consult (permissible substitution): Payer: Medicare Other | Admitting: Sports Medicine

## 2013-04-15 NOTE — Progress Notes (Signed)
Records received, reviewed, and necessary changes to chart made.  

## 2013-04-19 ENCOUNTER — Encounter: Payer: Self-pay | Admitting: Sports Medicine

## 2013-04-19 ENCOUNTER — Ambulatory Visit (INDEPENDENT_AMBULATORY_CARE_PROVIDER_SITE_OTHER): Payer: Medicare Other | Admitting: Sports Medicine

## 2013-04-19 VITALS — BP 122/79 | HR 103

## 2013-04-19 DIAGNOSIS — M5416 Radiculopathy, lumbar region: Secondary | ICD-10-CM | POA: Insufficient documentation

## 2013-04-19 DIAGNOSIS — IMO0002 Reserved for concepts with insufficient information to code with codable children: Secondary | ICD-10-CM

## 2013-04-19 MED ORDER — PREDNISONE 50 MG PO TABS: ORAL_TABLET | ORAL | Status: DC

## 2013-04-19 MED ORDER — TRAMADOL HCL 50 MG PO TABS: 50.0000 mg | ORAL_TABLET | Freq: Three times a day (TID) | ORAL | Status: DC | PRN

## 2013-04-19 NOTE — Assessment & Plan Note (Signed)
We'll treat this conservatively with prednisone, tramadol. X-rays. Home exercises. Return in 4 weeks, MRI if no better.

## 2013-04-19 NOTE — Progress Notes (Signed)
  Subjective:    CC: Low back pain  HPI: This is a very pleasant 52 year old female was seen in the past for osteoarthritis of the knees, who comes in after having moved lots of furniture about 2 weeks ago. Several days later she had exquisite pain she localizes in the right side of her low back, radiating down her right leg in an L5 versus an S1 distribution. Pain is worse with sitting, Valsalva, riding in a car, improving significantly. It was a 10 out of 10 several days ago, and is now 5/10. She's not really using oral medications for this. Pain is improving on a daily basis.  Past medical history, Surgical history, Family history not pertinant except as noted below, Social history, Allergies, and medications have been entered into the medical record, reviewed, and no changes needed.   Review of Systems: No fevers, chills, night sweats, weight loss, chest pain, or shortness of breath.   Objective:    General: Well Developed, well nourished, and in no acute distress.  Neuro: Alert and oriented x3, extra-ocular muscles intact, sensation grossly intact.  HEENT: Normocephalic, atraumatic, pupils equal round reactive to light, neck supple, no masses, no lymphadenopathy, thyroid nonpalpable.  Skin: Warm and dry, no rashes. Cardiac: Regular rate and rhythm, no murmurs rubs or gallops, no lower extremity edema.  Respiratory: Clear to auscultation bilaterally. Not using accessory muscles, speaking in full sentences. Back Exam:  Inspection: Unremarkable  Motion: Flexion 45 deg, Extension 45 deg, Side Bending to 45 deg bilaterally,  Rotation to 45 deg bilaterally  SLR laying: Negative  XSLR laying: Negative  Palpable tenderness: Minimal along the right paraspinal muscles.[name] FABER: negative. Sensory change: Gross sensation intact to all lumbar and sacral dermatomes.  Reflexes: 2+ at both patellar tendons, 2+ at achilles tendons, Babinski's downgoing.  Strength at foot  Plantar-flexion: 5/5  Dorsi-flexion: 5/5 Eversion: 5/5 Inversion: 5/5  Leg strength  Quad: 5/5 Hamstring: 5/5 Hip flexor: 5/5 Hip abductors: 5/5  Gait unremarkable.  Impression and Recommendations:

## 2013-04-26 ENCOUNTER — Emergency Department (INDEPENDENT_AMBULATORY_CARE_PROVIDER_SITE_OTHER): Payer: Medicare Other

## 2013-04-26 ENCOUNTER — Emergency Department (INDEPENDENT_AMBULATORY_CARE_PROVIDER_SITE_OTHER)
Admission: EM | Admit: 2013-04-26 | Discharge: 2013-04-26 | Disposition: A | Discharge status: Home / Self Care | Payer: Medicare Other | Source: Home / Self Care | Attending: Family Medicine | Admitting: Family Medicine

## 2013-04-26 ENCOUNTER — Encounter: Payer: Self-pay | Admitting: *Deleted

## 2013-04-26 DIAGNOSIS — R109 Unspecified abdominal pain: Secondary | ICD-10-CM

## 2013-04-26 DIAGNOSIS — R141 Gas pain: Secondary | ICD-10-CM

## 2013-04-26 DIAGNOSIS — R112 Nausea with vomiting, unspecified: Secondary | ICD-10-CM

## 2013-04-26 DIAGNOSIS — N83209 Unspecified ovarian cyst, unspecified side: Secondary | ICD-10-CM

## 2013-04-26 DIAGNOSIS — A059 Bacterial foodborne intoxication, unspecified: Secondary | ICD-10-CM

## 2013-04-26 DIAGNOSIS — R143 Flatulence: Secondary | ICD-10-CM

## 2013-04-26 DIAGNOSIS — R61 Generalized hyperhidrosis: Secondary | ICD-10-CM

## 2013-04-26 DIAGNOSIS — R079 Chest pain, unspecified: Secondary | ICD-10-CM

## 2013-04-26 LAB — POCT CBC W AUTO DIFF (K'VILLE URGENT CARE)

## 2013-04-26 MED ORDER — ONDANSETRON HCL 4 MG/2ML IJ SOLN: 4.0000 mg | Freq: Once | INTRAMUSCULAR | Status: DC

## 2013-04-26 MED ORDER — ONDANSETRON 4 MG PO TBDP
4.0000 mg | ORAL_TABLET | Freq: Once | ORAL | Status: AC
  Administered 2013-04-26: 4 mg via ORAL

## 2013-04-26 MED ORDER — IOHEXOL 300 MG/ML  SOLN
100.0000 mL | Freq: Once | INTRAMUSCULAR | Status: AC | PRN
  Administered 2013-04-26: 100 mL via INTRAVENOUS

## 2013-04-26 MED ORDER — ONDANSETRON 4 MG PO TBDP: 4.0000 mg | ORAL_TABLET | Freq: Three times a day (TID) | ORAL | Status: DC | PRN

## 2013-04-26 NOTE — ED Provider Notes (Signed)
History     CSN: [id number]  Arrival date & time 04/26/13  1304   First MD Initiated Contact with Patient 04/26/13 1314      Chief Complaint  Patient presents with  . Emesis   HPI Patient presents today with chief complaint of emesis and diaphoresis. Patient is an otherwise normal state health and she proceeded drink a banana/strawberry smoothie. Patient states within 10-15 minutes of drinking the shake she began to feel nauseous, diaphoretic and weak. Symptoms have persisted since this point. Patient states that she vomited while in the waiting room and symptoms have since mildly improved since this point. Positive review systems includes some mild chest pain and diaphoresis as well as faint abdominal pain. No diarrhea. Patient denies any recent infections.  Past Medical History  Diagnosis Date  . Urticaria   . Essential hypertension, benign   . Primary osteoarthritis of both knees   . Exertional chest pain   . Menstrual migraine     Past Surgical History  Procedure Laterality Date  . Tubal ligation  12/26/1987    Family History  Problem Relation Age of Onset  . Hypertension Mother     History  Substance Use Topics  . Smoking status: Former Smoker    Quit date: 02/05/2006  . Smokeless tobacco: Never Used  . Alcohol Use: Yes    OB History   Grav Para Term Preterm Abortions TAB SAB Ect Mult Living                  Review of Systems  All other systems reviewed and are negative.    Allergies  Aspirin; Clindamycin/lincomycin; Erythromycin; Morphine and related; Nsaids; and Penicillins  Home Medications   Current Outpatient Rx  Name  Route  Sig  Dispense  Refill  . albuterol (PROVENTIL HFA;VENTOLIN HFA) 108 (90 BASE) MCG/ACT inhaler   Inhalation   Inhale 2 puffs into the lungs every 6 (six) hours as needed for wheezing.         Burton Apley Oil (CHIA SEED OIL EXTRACT) 1000 MG CAPS   Oral   Take by mouth.         . Fexofenadine HCl (ALLEGRA PO)  Oral   Take by mouth.         . fluticasone (FLONASE) 50 MCG/ACT nasal spray   Nasal   Place 2 sprays into the nose daily.         . hydrochlorothiazide (HYDRODIURIL) 25 MG tablet   Oral   Take 1 tablet (25 mg total) by mouth daily.   90 tablet   2   . hydrOXYzine (ATARAX/VISTARIL) 10 MG tablet   Oral   Take 10 mg by mouth 3 (three) times daily as needed.         . loratadine (CLARITIN) 10 MG tablet   Oral   Take 10 mg by mouth daily.         . metroNIDAZOLE (FLAGYL) 500 MG tablet   Oral   Take 1 tablet (500 mg total) by mouth 3 (three) times daily. For dental infection.   30 tablet   0   . montelukast (SINGULAIR) 10 MG tablet               . Multiple Vitamins-Minerals (MULTIVITAMIN PO)   Oral   Take by mouth.         [name] omeprazole (PRILOSEC) 20 MG capsule   Oral   Take 1 capsule (20 mg total) by mouth daily.  30 capsule   3   . propranolol (INDERAL) 40 MG tablet               . traMADol (ULTRAM) 50 MG tablet   Oral   Take 1 tablet (50 mg total) by mouth every 8 (eight) hours as needed for pain.   50 tablet   2     BP 123/84  Pulse 69  Resp 16  Ht 5\' 3"  (1.6 m)  Wt 184 lb (83.462 kg)  BMI 32.6 kg/m2  SpO2 100%  Physical Exam  Constitutional: She appears well-developed and well-nourished.  HENT:  Head: Normocephalic and atraumatic.  Eyes: Pupils are equal, round, and reactive to light.  Neck: Normal range of motion. Neck supple.  Cardiovascular: Normal rate, regular rhythm and normal heart sounds.   Pulmonary/Chest: Effort normal and breath sounds normal.  Abdominal: Soft. Bowel sounds are normal.  + epigastric and RLQ TTP  No distension    Musculoskeletal: Normal range of motion.  Neurological: She is alert.  Skin: Skin is warm.    ED Course  Procedures (including critical care time)  Labs Reviewed - No data to display Ct Abdomen Pelvis W Contrast  04/26/2013   *RADIOLOGY REPORT*  Clinical Data:  emesis.  Nausea vomiting   CT ABDOMEN AND PELVIS WITH CONTRAST  Technique:  Multidetector CT imaging of the abdomen and pelvis was performed following the standard protocol during bolus administration of intravenous contrast.  Contrast: OMNIPAQUE IOHEXOL 300 MG/ML  SOLN  Comparison: None.  Findings: The lung bases are clear.  No pericardial or pleural effusion.  Mild diffuse low attenuation throughout the liver parenchyma is noted consistent with fatty infiltration.  No focal liver abnormality noted.  The gallbladder appears normal.  No biliary dilatation.  Normal appearance of the pancreas.  The spleen appears normal.  The adrenal glands are both normal.  Normal appearance of the right kidney.  The left kidney is also normal.  Urinary bladder appears normal.  Uterus has a normal appearance.  There is a cyst in the right ovary which measures 2.6 cm, image 67/series 2.  Normal caliber of the abdominal aorta.  There is no upper abdominal adenopathy identified.  No pelvic or inguinal adenopathy identified.  There is no free fluid or abnormal fluid collections identified within the abdomen or pelvis.  The stomach appears normal.  Normal appearance of the small bowel loops.  There is no evidence for small bowel obstruction.  The appendix is visualized and has a normal appearance  The proximal colon appears normal.  Multiple distal colonic diverticula identified.  The descending colon and sigmoid colon appears mildly thickened which is nonspecific and may reflect incomplete distention.  There are no secondary signs of colitis. Specifically no fat stranding or free fluid.  Review of the are visualized osseous structures shows evidence of bilateral sacral ileitis, image 63/series 2.  Facet degenerative changes noted within the lower lumbar spine.  IMPRESSION:  1.  There is mild wall thickening involving the descending colon and sigmoid colon.  This is favored to represent incomplete distention.  There are no secondary signs of acute colitis or  diverticulitis.  However, correlation for any clinical signs or symptoms of bowel inflammation would be advised. 2.  No evidence for abscess or bowel obstruction. 3.  The appendix is visualized and appears normal. 4.  Right ovarian cyst.   Original Report Authenticated By: Signa Kell, M.D.   EKG: NSR, nonspecific TW changes, unchanged from previous.  1. Food poisoning   2. Nausea with vomiting       MDM  Suspect symptoms likely a mild case of food poisoning. Avoid strawberries and bananas.  EKG, CBC, orthostatics within normal limits. Doubt cardiac source given exam and history.  CT scan with mild inflammatory colonic changes that may be secondary to ingestion. No overt signs of diverticulitis or true colitis. Will place on bland diet.  Zofran for nausea.  Discussed GI and systemic red flags. Follow up as needed.     The patient and/or caregiver has been counseled thoroughly with regard to treatment plan and/or medications prescribed including dosage, schedule, interactions, rationale for use, and possible side effects and they verbalize understanding. Diagnoses and expected course of recovery discussed and will return if not improved as expected or if the condition worsens. Patient and/or caregiver verbalized understanding.            Doree Albee, MD 04/26/13 [phone]

## 2013-04-26 NOTE — ED Notes (Signed)
Kjersti report vomiting and diaphoresis after eating a smoothie earlier today. Denies abdominal pain.

## 2013-04-27 LAB — COMPREHENSIVE METABOLIC PANEL
ALT: 29 U/L (ref 0–35)
AST: 23 U/L (ref 0–37)
Albumin: 4.1 g/dL (ref 3.5–5.2)
Alkaline Phosphatase: 63 U/L (ref 39–117)
BUN: 10 mg/dL (ref 6–23)
CO2: 29 mEq/L (ref 19–32)
Calcium: 9.6 mg/dL (ref 8.4–10.5)
Chloride: 99 mEq/L (ref 96–112)
Creat: 0.79 mg/dL (ref 0.50–1.10)
Glucose, Bld: 118 mg/dL — ABNORMAL HIGH (ref 70–99)
Potassium: 4.7 mEq/L (ref 3.5–5.3)
Sodium: 140 mEq/L (ref 135–145)
Total Bilirubin: 0.4 mg/dL (ref 0.3–1.2)
Total Protein: 7 g/dL (ref 6.0–8.3)

## 2013-04-28 ENCOUNTER — Telehealth: Payer: Self-pay | Admitting: Emergency Medicine

## 2013-04-30 LAB — TROPONIN T: Troponin T TROPT: <0.01 ng/mL (ref <0.01)

## 2013-05-01 ENCOUNTER — Telehealth: Payer: Self-pay | Admitting: *Deleted

## 2013-05-08 ENCOUNTER — Telehealth: Payer: Self-pay | Admitting: *Deleted

## 2013-05-08 NOTE — Telephone Encounter (Signed)
Pt would like a letter written allowing her to have th darker tint on her windows because of her urticaria. She states she is sensitive to the sun and the sun causes her urticaria to flare up. Pt states she has a letter that her previous doctor wrote for her from [place] if you needed to see that.

## 2013-05-14 ENCOUNTER — Telehealth: Payer: Self-pay | Admitting: *Deleted

## 2013-05-14 DIAGNOSIS — K047 Periapical abscess without sinus: Secondary | ICD-10-CM

## 2013-05-14 MED ORDER — METRONIDAZOLE 500 MG PO TABS: 500.0000 mg | ORAL_TABLET | Freq: Three times a day (TID) | ORAL | Status: DC

## 2013-05-14 NOTE — Telephone Encounter (Signed)
Pt would like another abx rx'ed to her for her abscessed tooth. Pt states she cannot get in to see the dentist until the end of July. Please advise

## 2013-05-14 NOTE — Telephone Encounter (Signed)
Refill of [facility]yl sent [facility] on [facility].

## 2013-05-14 NOTE — Telephone Encounter (Signed)
Janet Richards, Note placed in your inbox ready for pickup/fax.

## 2013-05-15 NOTE — Telephone Encounter (Signed)
Pt will pick up letter when she is in for her appt friday

## 2013-05-15 NOTE — Telephone Encounter (Signed)
Pt already had picked rx up

## 2013-05-17 ENCOUNTER — Ambulatory Visit (INDEPENDENT_AMBULATORY_CARE_PROVIDER_SITE_OTHER): Payer: Medicare Other | Admitting: Sports Medicine

## 2013-05-17 ENCOUNTER — Encounter: Payer: Self-pay | Admitting: Sports Medicine

## 2013-05-17 VITALS — BP 138/77 | HR 97 | Wt 184.0 lb

## 2013-05-17 DIAGNOSIS — L500 Allergic urticaria: Secondary | ICD-10-CM

## 2013-05-17 DIAGNOSIS — M5416 Radiculopathy, lumbar region: Secondary | ICD-10-CM

## 2013-05-17 MED ORDER — METHYLPREDNISOLONE SODIUM SUCC 125 MG IJ SOLR
125.0000 mg | Freq: Once | INTRAMUSCULAR | Status: AC
  Administered 2013-05-17: 125 mg via INTRAMUSCULAR

## 2013-05-17 MED ORDER — METHYLPREDNISOLONE ACETATE 40 MG/ML IJ SUSP
40.0000 mg | Freq: Once | INTRAMUSCULAR | Status: AC
  Administered 2013-05-17: 40 mg via INTRAMUSCULAR

## 2013-05-17 NOTE — Assessment & Plan Note (Addendum)
Resolved with conservative measures. 

## 2013-05-17 NOTE — Assessment & Plan Note (Signed)
Recurrence of allergic urticaria. Depo-Medrol 40, Solu-Medrol 125 mg intramuscular. Followup with primary care provider regarding this.

## 2013-05-17 NOTE — Progress Notes (Signed)
  Subjective:    CC: Followup  HPI: Allergic urticaria: Having a flare, hives are localized over the arms, back. Symptoms are moderate, persistent. Worse with sun exposure.  Low back pain: This is resolved with conservative measures.  Past medical history, Surgical history, Family history not pertinant except as noted below, Social history, Allergies, and medications have been entered into the medical record, reviewed, and no changes needed.   Review of Systems: No fevers, chills, night sweats, weight loss, chest pain, or shortness of breath.   Objective:    General: Well Developed, well nourished, and in no acute distress.  Neuro: Alert and oriented x3, extra-ocular muscles intact, sensation grossly intact.  HEENT: Normocephalic, atraumatic, pupils equal round reactive to light, neck supple, no masses, no lymphadenopathy, thyroid nonpalpable.  Skin: Warm and dry, no rashes. Hives noted over her arms and back. Cardiac: Regular rate and rhythm, no murmurs rubs or gallops, no lower extremity edema.  Respiratory: Clear to auscultation bilaterally. Not using accessory muscles, speaking in full sentences.  Depo-Medrol 40, Solu-Medrol 125 mg given intramuscular in the office.  Impression and Recommendations:

## 2013-05-20 ENCOUNTER — Telehealth: Payer: Self-pay | Admitting: *Deleted

## 2013-05-20 MED ORDER — PREDNISONE 50 MG PO TABS: ORAL_TABLET | ORAL | Status: DC

## 2013-05-20 NOTE — Telephone Encounter (Signed)
Pt notified of med sent & she said to tell you THANK YOU!!

## 2013-05-20 NOTE — Telephone Encounter (Signed)
Medication called in. Needs to followup with Dr. Ivan Anchors for other primary care requests including medications. I am happy to address her orthopaedic concerns.

## 2013-05-20 NOTE — Telephone Encounter (Signed)
Pt calls & states that she came in fri & got 2 injections.  She states this morning that she is broken out on both legs, right side is worse, & left elbow.  She complains of itching really bad.  She is asking if you can give her prednisone.  Please advise

## 2013-05-21 ENCOUNTER — Telehealth: Payer: Self-pay | Admitting: *Deleted

## 2013-05-21 MED ORDER — PREDNISONE 20 MG PO TABS: ORAL_TABLET | ORAL | Status: AC

## 2013-05-21 NOTE — Telephone Encounter (Signed)
[facility] [place]

## 2013-05-21 NOTE — Telephone Encounter (Signed)
Pt called and wants another rx of prednisone for her hives.(see previous phone note) She feels the medication helped but she needs a few more days for her urticaria.

## 2013-05-23 ENCOUNTER — Encounter: Payer: Self-pay | Admitting: Family Medicine

## 2013-05-23 DIAGNOSIS — L505 Cholinergic urticaria: Secondary | ICD-10-CM

## 2013-05-23 HISTORY — DX: Cholinergic urticaria: L50.5

## 2013-05-31 ENCOUNTER — Telehealth: Payer: Self-pay | Admitting: Family Medicine

## 2013-05-31 NOTE — Telephone Encounter (Signed)
Patient called and advised that she think she has the same symptoms as her family member Diverticulitis and Colitis which may be causing her to have bloating in stomach and thigh. May be the reason she is bloating and cant loose any weight or keep it off. Thanks.

## 2013-06-18 ENCOUNTER — Encounter: Payer: Self-pay | Admitting: Family Medicine

## 2013-06-18 ENCOUNTER — Ambulatory Visit (INDEPENDENT_AMBULATORY_CARE_PROVIDER_SITE_OTHER): Payer: Medicare Other | Admitting: Family Medicine

## 2013-06-18 VITALS — BP 139/92 | HR 96 | Wt 184.0 lb

## 2013-06-18 DIAGNOSIS — K047 Periapical abscess without sinus: Secondary | ICD-10-CM

## 2013-06-18 DIAGNOSIS — L505 Cholinergic urticaria: Secondary | ICD-10-CM

## 2013-06-18 DIAGNOSIS — I10 Essential (primary) hypertension: Secondary | ICD-10-CM

## 2013-06-18 MED ORDER — METRONIDAZOLE 500 MG PO TABS: 500.0000 mg | ORAL_TABLET | Freq: Three times a day (TID) | ORAL | Status: DC

## 2013-06-18 NOTE — Progress Notes (Signed)
CC: Janet Richards is a 52 y.o. female is here for Follow-up   Subjective: HPI:  Dental pain: Patient reports stabbing moderate sensation in the right lower jaw localized to a tooth that has caused similar problems in the past. It is worse with chewing at that time it radiates into the jaw and right ear. It is slightly improved with rest. Slightly improved with ibuprofen. She's been unable to have this pulled due to financial reasons. She denies fevers, chills, nausea, vomiting, flushing, nor dysphasia. Similar symptoms in the past are completely eradicated with metronidazole one day after starting lasting up to 1-2 weeks after completion  Followup essential hypertension: She continues hydrochlorothiazide 25 mg a day without missed doses no outside blood pressures to report denies chest pain, shortness of breath, motor sensory disturbances nor limb claudication  Followup cholinergic urticaria: Since I saw her last she has needed 1 round of prednisone symptoms are improving now that she is avoiding sunlight and using the windows in her car. She denies any itching or skin changes today   Review Of Systems Outlined In HPI  Past Medical History  Diagnosis Date  . Urticaria   . Essential hypertension, benign   . Primary osteoarthritis of both knees   . Exertional chest pain   . Menstrual migraine      Family History  Problem Relation Age of Onset  . Hypertension Mother      History  Substance Use Topics  . Smoking status: Former Smoker    Quit date: 02/05/2006  . Smokeless tobacco: Never Used  . Alcohol Use: Yes     Objective: Filed Vitals:   06/18/13 1050  BP: 139/92  Pulse: 96    General: Alert and Oriented, No Acute Distress HEENT: Pupils equal, round, reactive to light. Conjunctivae clear.  External ears unremarkable, canals clear with intact TMs with appropriate landmarks.  Middle ear appears open without effusion. Pink inferior turbinates.  Moist mucous membranes,  pharynx without inflammation nor lesions. Right inferior posterior most molar shows moderate decay tender to the touch with mild surrounding erythema in the gums. Neck supple without palpable lymphadenopathy nor abnormal masses. Lungs: Clear to auscultation bilaterally, no wheezing/ronchi/rales.  Comfortable work of breathing. Good air movement. Cardiac: Regular rate and rhythm. Normal S1/S2.  No murmurs, rubs, nor gallops.   Extremities: No peripheral edema.  Strong peripheral pulses.  Mental Status: No depression, anxiety, nor agitation. Skin: Warm and dry.  Assessment & Plan: Janet Richards was seen today for follow-up.  Diagnoses and associated orders for this visit:  Essential hypertension, benign  Cholinergic urticaria  Dental abscess - metroNIDAZOLE (FLAGYL) 500 MG tablet; Take 1 tablet (500 mg total) by mouth 3 (three) times daily. For dental infection.    Dental abscess: Counseled patient that definitive care would include removal of this became tooth, start metronidazole and had tooth removed as soon as possible Cholinergic urticaria: Stable continue antihistamines and montelukast Essential hypertension: Uncontrolled chronic condition of encouraged her to go up on hydrochlorothiazide however she would prefer to wait until pain subsides for recheck of blood pressure  Return in about 4 weeks (around 07/16/2013).

## 2013-06-27 ENCOUNTER — Ambulatory Visit: Payer: Medicare Other | Admitting: Family Medicine

## 2013-06-28 ENCOUNTER — Telehealth: Payer: Self-pay | Admitting: *Deleted

## 2013-06-28 MED ORDER — PREDNISONE 20 MG PO TABS: ORAL_TABLET | ORAL | Status: AC

## 2013-06-28 NOTE — Telephone Encounter (Signed)
Pt left a message that she is breaking out in hives and wants prednisone called into pharm

## 2013-06-28 NOTE — Telephone Encounter (Signed)
Pt.notified

## 2013-06-28 NOTE — Telephone Encounter (Signed)
[facility]

## 2013-07-02 ENCOUNTER — Ambulatory Visit: Payer: Medicare Other | Admitting: Family Medicine

## 2013-07-05 ENCOUNTER — Encounter: Payer: Self-pay | Admitting: Family Medicine

## 2013-07-05 ENCOUNTER — Ambulatory Visit (INDEPENDENT_AMBULATORY_CARE_PROVIDER_SITE_OTHER): Payer: Medicare Other | Admitting: Family Medicine

## 2013-07-05 VITALS — BP 127/79 | HR 83 | Wt 185.0 lb

## 2013-07-05 DIAGNOSIS — K047 Periapical abscess without sinus: Secondary | ICD-10-CM

## 2013-07-05 MED ORDER — AMOXICILLIN-POT CLAVULANATE 500-125 MG PO TABS: ORAL_TABLET | ORAL | Status: AC

## 2013-07-05 NOTE — Progress Notes (Signed)
CC: Janet Richards is a 52 y.o. female is here for f/u mouth infection   Subjective: HPI:  Patient complains of dental and gum pain localized in the right lower jaw line is described as a toothache moderate severity worse with chewing or touching any of her teeth in the back lower aspect of her mouth. Present on a daily basis. Has worsened significantly since stopping last prescription of Flagyl. She denies fevers, chills, nausea, vomiting, headache, dysphagia. No interventions as of yet. She has found a dentist that'll see her in October at a reduced rate. Pain was completely resolved after one day of starting Flagyl only to return   Review Of Systems Outlined In HPI  Past Medical History  Diagnosis Date  . Urticaria   . Essential hypertension, benign   . Primary osteoarthritis of both knees   . Exertional chest pain   . Menstrual migraine      Family History  Problem Relation Age of Onset  . Hypertension Mother      History  Substance Use Topics  . Smoking status: Former Smoker    Quit date: 02/05/2006  . Smokeless tobacco: Never Used  . Alcohol Use: Yes     Objective: Filed Vitals:   07/05/13 1126  BP: 127/79  Pulse: 83    General: Alert and Oriented, No Acute Distress HEENT: Pupils equal, round, reactive to light. Conjunctivae clear.  External ears unremarkable, canals clear with intact TMs with appropriate landmarks.  Middle ear appears open without effusion. Pink inferior turbinates.  Moist mucous membranes, pharynx without inflammation nor lesions.  Neck supple without palpable lymphadenopathy nor abnormal masses. Single right inferior molar with poor dentition which is tender to touch with mild erythema at base of gum line, no expressible fluid/discharge Cardiac: Regular rate and rhythm Neuro: Cranial nerves II through XII grossly intact Mental Status: No depression, anxiety, nor agitation. Skin: Warm and dry.  Assessment & Plan: Janet Richards was seen today for  f/u mouth infection.  Diagnoses and associated orders for this visit:  Dental abscess - amoxicillin-clavulanate (AUGMENTIN) 500-125 MG per tablet; Take one by mouth every 8 hours for ten total days.    Discussed with patient the importance of having this tooth removed as soon as possible to avoid pain and repeat needed for antibiotics. She says she has tolerated amoxicillin-containing products in the past, we will try Augmentin instead of Flagyl in order to minimize resistance developing  Return if symptoms worsen or fail to improve.

## 2013-07-09 ENCOUNTER — Telehealth: Payer: Self-pay | Admitting: *Deleted

## 2013-07-09 MED ORDER — FLUCONAZOLE 150 MG PO TABS: 150.0000 mg | ORAL_TABLET | Freq: Once | ORAL | Status: DC

## 2013-07-09 NOTE — Telephone Encounter (Signed)
Pt called and states she is getting a yeast infection from the Augmentin that was rx'ed last week and would like diflucan.Rx sent into the pharm

## 2013-07-23 ENCOUNTER — Ambulatory Visit (INDEPENDENT_AMBULATORY_CARE_PROVIDER_SITE_OTHER): Payer: Medicare Other | Admitting: Family Medicine

## 2013-07-23 ENCOUNTER — Encounter: Payer: Self-pay | Admitting: Family Medicine

## 2013-07-23 ENCOUNTER — Other Ambulatory Visit (HOSPITAL_COMMUNITY)
Admission: RE | Admit: 2013-07-23 | Discharge: 2013-07-23 | Disposition: A | Discharge status: Home / Self Care | Payer: Medicare Other | Source: Ambulatory Visit | Attending: Family Medicine | Admitting: Family Medicine

## 2013-07-23 VITALS — BP 114/69 | HR 86 | Wt 184.0 lb

## 2013-07-23 DIAGNOSIS — Z01419 Encounter for gynecological examination (general) (routine) without abnormal findings: Secondary | ICD-10-CM | POA: Insufficient documentation

## 2013-07-23 DIAGNOSIS — Z124 Encounter for screening for malignant neoplasm of cervix: Secondary | ICD-10-CM

## 2013-07-23 DIAGNOSIS — Z1239 Encounter for other screening for malignant neoplasm of breast: Secondary | ICD-10-CM

## 2013-07-23 DIAGNOSIS — Z1151 Encounter for screening for human papillomavirus (HPV): Secondary | ICD-10-CM | POA: Insufficient documentation

## 2013-07-23 NOTE — Progress Notes (Signed)
CC: Janet Richards is a 52 y.o. female is here for Gynecologic Exam and mammogram   Subjective: HPI:  Requesting annual Pap exam, she's never had abnormal, last Pap smear sometime 1-2 years ago. Denies pelvic pain, abdominal pain. Over the past year periods have become more irregular. Occasional hot flashes mild in severity.   Review Of Systems Outlined In HPI  Past Medical History  Diagnosis Date  . Urticaria   . Essential hypertension, benign   . Primary osteoarthritis of both knees   . Exertional chest pain   . Menstrual migraine      Family History  Problem Relation Age of Onset  . Hypertension Mother      History  Substance Use Topics  . Smoking status: Former Smoker    Quit date: 02/05/2006  . Smokeless tobacco: Never Used  . Alcohol Use: Yes     Objective: Filed Vitals:   07/23/13 0932  BP: 114/69  Pulse: 86    Vital signs reviewed. General: Alert and Oriented, No Acute Distress HEENT: Pupils equal, round, reactive to light. Conjunctivae clear.  External ears unremarkable.  Moist mucous membranes. Lungs: Clear and comfortable work of breathing, speaking in full sentences without accessory muscle use. Cardiac: Regular rate and rhythm.  Abdomen: Soft nontenderripheral pulses.   genitourinary: Normal appearing labia minora majora normal distal urethra vaginal vault without abnormality cervix is nontender scant bleeding in the vaginal vault  Skin: Warm and dry.  Assessment & Plan: Janet Richards was seen today for gynecologic exam and mammogram.  Diagnoses and associated orders for this visit:  Pap smear for cervical cancer screening - Cytology - PAP  Screening for breast cancer - MM Digital Screening; Future     Pap smear obtained will be sent for cytology which reflex HPV, she is due for annual screening mammography order has been placed  Return if symptoms worsen or fail to improve.

## 2013-07-29 ENCOUNTER — Telehealth: Payer: Self-pay | Admitting: *Deleted

## 2013-07-29 MED ORDER — METRONIDAZOLE 500 MG PO TABS: 500.0000 mg | ORAL_TABLET | Freq: Three times a day (TID) | ORAL | Status: AC

## 2013-07-29 NOTE — Telephone Encounter (Signed)
Pt called and wants another ten days of abx for tooth/mouth infection

## 2013-07-29 NOTE — Telephone Encounter (Signed)
Pt.notified

## 2013-07-29 NOTE — Telephone Encounter (Signed)
[facility] [address]

## 2013-08-01 ENCOUNTER — Ambulatory Visit (INDEPENDENT_AMBULATORY_CARE_PROVIDER_SITE_OTHER): Payer: Medicare Other

## 2013-08-01 DIAGNOSIS — Z1239 Encounter for other screening for malignant neoplasm of breast: Secondary | ICD-10-CM

## 2013-08-01 DIAGNOSIS — Z1231 Encounter for screening mammogram for malignant neoplasm of breast: Secondary | ICD-10-CM

## 2013-08-05 ENCOUNTER — Encounter: Payer: Self-pay | Admitting: Family Medicine

## 2013-08-05 ENCOUNTER — Encounter: Payer: Self-pay | Admitting: *Deleted

## 2013-08-05 ENCOUNTER — Ambulatory Visit (INDEPENDENT_AMBULATORY_CARE_PROVIDER_SITE_OTHER): Payer: Medicare Other

## 2013-08-05 ENCOUNTER — Emergency Department (INDEPENDENT_AMBULATORY_CARE_PROVIDER_SITE_OTHER)
Admission: EM | Admit: 2013-08-05 | Discharge: 2013-08-05 | Disposition: A | Discharge status: Home / Self Care | Payer: Medicare Other | Source: Home / Self Care | Attending: Emergency Medicine | Admitting: Emergency Medicine

## 2013-08-05 DIAGNOSIS — R358 Other polyuria: Secondary | ICD-10-CM

## 2013-08-05 DIAGNOSIS — R0602 Shortness of breath: Secondary | ICD-10-CM

## 2013-08-05 DIAGNOSIS — R3589 Other polyuria: Secondary | ICD-10-CM

## 2013-08-05 DIAGNOSIS — R739 Hyperglycemia, unspecified: Secondary | ICD-10-CM | POA: Insufficient documentation

## 2013-08-05 DIAGNOSIS — S39012A Strain of muscle, fascia and tendon of lower back, initial encounter: Secondary | ICD-10-CM

## 2013-08-05 DIAGNOSIS — S335XXA Sprain of ligaments of lumbar spine, initial encounter: Secondary | ICD-10-CM

## 2013-08-05 LAB — POCT URINALYSIS DIP (MANUAL ENTRY)
Glucose, UA: NEGATIVE
Nitrite, UA: POSITIVE
Protein Ur, POC: 30
Spec Grav, UA: 1.025 (ref 1.005–1.03)
Urobilinogen, UA: 0.2 (ref 0–1)
pH, UA: 6 (ref 5–8)

## 2013-08-05 MED ORDER — METHOCARBAMOL 500 MG PO TABS: 500.0000 mg | ORAL_TABLET | Freq: Two times a day (BID) | ORAL | Status: DC

## 2013-08-05 MED ORDER — CIPROFLOXACIN HCL 500 MG PO TABS: 500.0000 mg | ORAL_TABLET | Freq: Two times a day (BID) | ORAL | Status: DC

## 2013-08-05 NOTE — ED Notes (Signed)
Janet Richards c/o right sided mid back pain that radiates to the left lower back x 4 days. No known injury. She is currently moviing but claims she is not lifting anything heavy, just packing boxes.

## 2013-08-05 NOTE — ED Provider Notes (Signed)
CSN: [id number]     Arrival date & time 08/05/13  4098 History   First MD Initiated Contact with Patient 08/05/13 [phone]     Chief Complaint  Patient presents with  . Back Pain   (Consider location/radiation/quality/duration/timing/severity/associated sxs/prior Treatment) HPI This is a 52 year old [place] American female who presents with right-sided back pain that radiates to her left lower back for the last 4 days.  No known injury however she is moving to an in-house and has been lifting some boxes and packing.  No radiation of pain, no trauma.  No bladder or bowel dysfunction.  She also complains of mild polyuria for the last month but no dysuria or hematuria.  She's been taking Flagyl for a dental infection and has had a yeast infection last month treated with glucan.  A secondary complaint was that she feels mild shortness of breath and orthopnea especially when bending over or laying down.  She states that she has had some lower extremity swelling in the past and wonders if she also has some fluid in her lungs as well and she is requesting that a chest x-ray be done.  No chest pain or severe symptoms.  Past Medical History  Diagnosis Date  . Urticaria   . Essential hypertension, benign   . Primary osteoarthritis of both knees   . Exertional chest pain   . Menstrual migraine    Past Surgical History  Procedure Laterality Date  . Tubal ligation  12/26/1987   Family History  Problem Relation Age of Onset  . Hypertension Mother    History  Substance Use Topics  . Smoking status: Former Smoker    Quit date: 02/05/2006  . Smokeless tobacco: Never Used  . Alcohol Use: Yes   OB History   Grav Para Term Preterm Abortions TAB SAB Ect Mult Living                 Review of Systems  All other systems reviewed and are negative.    Allergies  Aspirin; Clindamycin/lincomycin; Erythromycin; Morphine and related; Nsaids; and Penicillins  Home Medications   Current Outpatient Rx    Name  Route  Sig  Dispense  Refill  . albuterol (PROVENTIL HFA;VENTOLIN HFA) 108 (90 BASE) MCG/ACT inhaler   Inhalation   Inhale 2 puffs into the lungs every 6 (six) hours as needed for wheezing.         Burton Apley Oil (CHIA SEED OIL EXTRACT) 1000 MG CAPS   Oral   Take by mouth.         . ciprofloxacin (CIPRO) 500 MG tablet   Oral   Take 1 tablet (500 mg total) by mouth 2 (two) times daily.   10 tablet   0   . Fexofenadine HCl (ALLEGRA PO)   Oral   Take by mouth.         . fluticasone (FLONASE) 50 MCG/ACT nasal spray   Nasal   Place 2 sprays into the nose daily.         . hydrochlorothiazide (HYDRODIURIL) 25 MG tablet   Oral   Take 1 tablet (25 mg total) by mouth daily.   90 tablet   2   . hydrOXYzine (ATARAX/VISTARIL) 10 MG tablet   Oral   Take 10 mg by mouth 3 (three) times daily as needed.         . loratadine (CLARITIN) 10 MG tablet   Oral   Take 10 mg by mouth daily.         [name]  methocarbamol (ROBAXIN) 500 MG tablet   Oral   Take 1 tablet (500 mg total) by mouth 2 (two) times daily.   14 tablet   0   . metroNIDAZOLE (FLAGYL) 500 MG tablet   Oral   Take 1 tablet (500 mg total) by mouth 3 (three) times daily.   30 tablet   0   . montelukast (SINGULAIR) 10 MG tablet               . Multiple Vitamins-Minerals (MULTIVITAMIN PO)   Oral   Take by mouth.         [name] omeprazole (PRILOSEC) 20 MG capsule   Oral   Take 1 capsule (20 mg total) by mouth daily.   30 capsule   3   . propranolol (INDERAL) 40 MG tablet               . traMADol (ULTRAM) 50 MG tablet   Oral   Take 1 tablet (50 mg total) by mouth every 8 (eight) hours as needed for pain.   50 tablet   2    BP 113/77  Pulse 95  Temp(Src) 98.2 F (36.8 C) (Oral)  Resp 16  Wt 179 lb (81.194 kg)  BMI 31.72 kg/m2  SpO2 98%  LMP 07/16/2013 Physical Exam  Nursing note and vitals reviewed. Constitutional: She is oriented to person, place, and time. She appears well-developed  and well-nourished. She does not have a sickly appearance. No distress.  HENT:  Head: Normocephalic and atraumatic.  Eyes: No scleral icterus.  Neck: Neck supple.  Cardiovascular: Regular rhythm and normal heart sounds.   Pulmonary/Chest: Effort normal and breath sounds normal. No respiratory distress. She has no decreased breath sounds. She has no wheezes.  Abdominal: There is no tenderness.  Musculoskeletal:       Back:  Neurological: She is alert and oriented to person, place, and time.  Skin: Skin is warm and dry.  Psychiatric: She has a normal mood and affect. Her speech is normal.    ED Course  Procedures (including critical care time) Labs Review Labs Reviewed  URINE CULTURE  POCT URINALYSIS DIP (MANUAL ENTRY)   Imaging Review No results found.  MDM   1. Lumbar strain, initial encounter   2. Polyuria   3. Shortness of breath     This patient likely has a component of a lumbar strain secondary to moving boxes.  She already has Flexeril at home but it makes her too sleepy so I will instead try Robaxin.  Should use a heating pad and general rest to avoid lifting pushing and pulling.  If not improving is to followup with Dr. Karie Schwalbe.  However with her polyuria for the last month, I also ordered a urinalysis which came back positive for likely urinary tract infection.  Therefore I also started her on Cipro and got a urine culture.  Encourage hydration.  For the secondary complaint of occasional shortness of breath and orthopnea, I have agreed that I will order a chest x-ray.  I do not suspect this is cardiac in origin but is more likely symptoms of her GERD.  However this will be outpatient and she will need to followup with her primary care physician to discuss the results.  I would like her to followup with her PCP either later this week or early next week to make sure that her back is feeling better and to talk about the chest x-ray results and urine culture results.  They could  consider doing some further labs such as a BNP or other blood work.    Marlaine Hind, MD 08/05/13 [phone]

## 2013-08-08 ENCOUNTER — Ambulatory Visit (INDEPENDENT_AMBULATORY_CARE_PROVIDER_SITE_OTHER): Payer: Medicare Other | Admitting: Family Medicine

## 2013-08-08 ENCOUNTER — Encounter: Payer: Self-pay | Admitting: Family Medicine

## 2013-08-08 VITALS — BP 126/81 | HR 90 | Wt 180.0 lb

## 2013-08-08 DIAGNOSIS — S233XXA Sprain of ligaments of thoracic spine, initial encounter: Secondary | ICD-10-CM

## 2013-08-08 DIAGNOSIS — S239XXA Sprain of unspecified parts of thorax, initial encounter: Secondary | ICD-10-CM

## 2013-08-08 LAB — URINE CULTURE: Colony Count: 40000

## 2013-08-08 MED ORDER — CARISOPRODOL 350 MG PO TABS: 350.0000 mg | ORAL_TABLET | Freq: Three times a day (TID) | ORAL | Status: DC | PRN

## 2013-08-08 MED ORDER — CYCLOBENZAPRINE HCL 10 MG PO TABS: ORAL_TABLET | ORAL | Status: DC

## 2013-08-08 NOTE — Progress Notes (Signed)
CC: Janet Richards is a 52 y.o. female is here for Back Pain   Subjective: HPI:  Complains of back pain that has been present since last Thursday came on gradually Thursday has been persistent ever since. It waxes and wanes from absent to severe severity. She localizes it to the middle of the back but to the lateral right and left sides it migrates throughout the day. It is described as a "sharp dull squeezing burning pain"  but does not brought on by any particular movement  however throughout her encounter every time she laughs it reproduces the pain. She denies midline back pain nor radiation of pain. She denies chest pain, breathing difficulty, wheezing, shortness of breath, exertional pain, abdominal pain, diarrhea, nausea nor vomiting. She denies flank pain. She had urinary urgency and frequency but this is gone after starting Cipro. Denies fevers, chills, confusion nor motor or sensory disturbances. She has tried Robaxin without any improvement she is intolerant to nonsteroidal anti-inflammatories due to swelling . She's tried heat and ice neither of which seemed to help or worsen pain she describes is 12 out of 10 in severity.   Review Of Systems Outlined In HPI  Past Medical History  Diagnosis Date  . Urticaria   . Essential hypertension, benign   . Primary osteoarthritis of both knees   . Exertional chest pain   . Menstrual migraine      Family History  Problem Relation Age of Onset  . Hypertension Mother      History  Substance Use Topics  . Smoking status: Former Smoker    Quit date: 02/05/2006  . Smokeless tobacco: Never Used  . Alcohol Use: Yes     Objective: Filed Vitals:   08/08/13 1105  BP: 126/81  Pulse: 90    General: Alert and Oriented, No Acute Distress HEENT: Pupils equal, round, reactive to light. Conjunctivae clear.   Moist mucous membranes pharynx unremarkable  Lungs: Clear to auscultation bilaterally, no wheezing/ronchi/rales.  Comfortable work of  breathing. Good air movement. Cardiac: Regular rate and rhythm. Normal S1/S2.  No murmurs, rubs, nor gallops.   Abdomen: Normal bowel sounds, soft and non tender without palpable masses. Extremities: No peripheral edema.  Strong peripheral pulses.  Back: No midline spinous process tenderness in the cervical lumbar or thoracic spine. Pain is reproduced with palpation of paraspinal musculature in the mid lower thoracic region, she has full range of motion in lumbar thoracic and cervical spine. Mental Status: No depression, anxiety, nor agitation. Skin: Warm and dry.  Assessment & Plan: Ajah was seen today for back pain.  Diagnoses and associated orders for this visit:  Thoracic sprain and strain, initial encounter  Other Orders - carisoprodol (SOMA) 350 MG tablet; Take 1 tablet (350 mg total) by mouth 3 (three) times daily as needed (back pain). - cyclobenzaprine (FLEXERIL) 10 MG tablet; Take a half to a full tab every 8-12 hours only as needed for muscle spasm, may cause sedation.    Discussed with patient my suspicion of thoracic muscle strain/sprain. Ideally she would take nonsteroidal anti-inflammatories however she is intolerant. I've encouraged her to rest to try cyclobenzaprine at that's not helping start on soma for no more than one week. When she is feeling 50% better encouraged her to engage in back strengthening and range of motion exercises handout was given. She had many well thought out questions regarding possibility of having pneumonia, bronchitis, pulmonary edema and whether or not her current pain could be related to  her heart. All of her questions were answered  40 minutes spent face-to-face during visit today of which at least 50% was counseling or coordinating care regarding thoracic lumbar sprain/strain.  Return in 2 weeks if not improving    Return if symptoms worsen or fail to improve.

## 2013-08-12 ENCOUNTER — Ambulatory Visit: Payer: Medicare Other | Admitting: Family Medicine

## 2013-08-13 ENCOUNTER — Ambulatory Visit (INDEPENDENT_AMBULATORY_CARE_PROVIDER_SITE_OTHER): Payer: Medicare Other | Admitting: Family Medicine

## 2013-08-13 ENCOUNTER — Encounter: Payer: Self-pay | Admitting: Family Medicine

## 2013-08-13 VITALS — BP 107/75 | HR 80 | Temp 98.0°F | Wt 181.0 lb

## 2013-08-13 DIAGNOSIS — N390 Urinary tract infection, site not specified: Secondary | ICD-10-CM

## 2013-08-13 DIAGNOSIS — N899 Noninflammatory disorder of vagina, unspecified: Secondary | ICD-10-CM

## 2013-08-13 DIAGNOSIS — N898 Other specified noninflammatory disorders of vagina: Secondary | ICD-10-CM

## 2013-08-13 LAB — POCT URINALYSIS DIPSTICK
Bilirubin, UA: NEGATIVE
Glucose, UA: NEGATIVE
Ketones, UA: NEGATIVE
Leukocytes, UA: NEGATIVE
Nitrite, UA: NEGATIVE
Protein, UA: NEGATIVE
Spec Grav, UA: <=1.005
Urobilinogen, UA: 0.2
pH, UA: 7

## 2013-08-13 MED ORDER — CIPROFLOXACIN HCL 500 MG PO TABS: 500.0000 mg | ORAL_TABLET | Freq: Two times a day (BID) | ORAL | Status: AC

## 2013-08-13 MED ORDER — FLUCONAZOLE 150 MG PO TABS: ORAL_TABLET | ORAL | Status: DC

## 2013-08-13 NOTE — Progress Notes (Signed)
CC: Janet Richards is a 52 y.o. female is here for Urinary Tract Infection   Subjective: HPI:  Patient denies vaginal irritation and is described as a burning when urinating on the external surface of her vagina. Symptoms were drastically improved after starting 2 weeks ago slowly have returned over the past 2-3 days. Symptoms are currently mild in severity nothing makes better or worse other than above. She reports urinary frequency. She denies fevers, chills, flank pain, urinary hesitancy, pelvic pain, abdominal pain, back pain, nor nausea.  She's currently menstruating otherwise denies vaginal discharge or rashes   Review Of Systems Outlined In HPI  Past Medical History  Diagnosis Date  . Urticaria   . Essential hypertension, benign   . Primary osteoarthritis of both knees   . Exertional chest pain   . Menstrual migraine      Family History  Problem Relation Age of Onset  . Hypertension Mother      History  Substance Use Topics  . Smoking status: Former Smoker    Quit date: 02/05/2006  . Smokeless tobacco: Never Used  . Alcohol Use: Yes     Objective: Filed Vitals:   08/13/13 1045  BP: 107/75  Pulse: 80  Temp: 98 F (36.7 C)    General: Alert and Oriented, No Acute Distress HEENT: Pupils equal, round, reactive to light. Conjunctivae clear.  Moist mucous membranes pharynx unremarkable Lungs: Clear to auscultation bilaterally, no wheezing/ronchi/rales.  Comfortable work of breathing. Good air movement. Cardiac: Regular rate and rhythm. Normal S1/S2.  No murmurs, rubs, nor gallops.   Abdomen: Obese soft nontender Back: No CVA tenderness Extremities: No peripheral edema.  Strong peripheral pulses.  Mental Status: No depression, anxiety, nor agitation. Skin: Warm and dry.  Assessment & Plan: Janet Richards was seen today for urinary tract infection.  Diagnoses and associated orders for this visit:  UTI (urinary tract infection) - Urinalysis  Dipstick - ciprofloxacin (CIPRO) 500 MG tablet; Take 1 tablet (500 mg total) by mouth 2 (two) times daily.  Vaginal irritation - fluconazole (DIFLUCAN) 150 MG tablet; Take one tab, may take second tab if no improvement after 72 hours.    Urinalysis normal other than blood which is most likely due to menses, while awaiting the culture I've encouraged her to start Bactrim, she politely declines would prefer Cipro we discussed return if symptoms might be due to resistance to Cipro, she's willing to take that chance. She'll call not improving in 2 days to switch to Bactrim, culture pending. Start Diflucan to cover high likelihood of vaginal candidiasis given recent antibiotic use  Return if symptoms worsen or fail to improve.

## 2013-08-13 NOTE — Addendum Note (Signed)
Addended by: Wyline Beady on: 08/13/2013 04:54 PM   Modules accepted: Orders

## 2013-08-15 LAB — URINE CULTURE
Colony Count: NO GROWTH
Organism ID, Bacteria: NO GROWTH

## 2013-09-01 ENCOUNTER — Other Ambulatory Visit: Payer: Self-pay | Admitting: Family Medicine

## 2013-09-20 ENCOUNTER — Ambulatory Visit: Payer: Medicare Other | Admitting: Sports Medicine

## 2013-09-23 ENCOUNTER — Encounter: Payer: Self-pay | Admitting: Physician Assistant

## 2013-09-23 ENCOUNTER — Ambulatory Visit (INDEPENDENT_AMBULATORY_CARE_PROVIDER_SITE_OTHER): Payer: Medicare Other | Admitting: Physician Assistant

## 2013-09-23 VITALS — BP 103/66 | HR 87 | Temp 98.0°F | Wt 173.0 lb

## 2013-09-23 DIAGNOSIS — R1032 Left lower quadrant pain: Secondary | ICD-10-CM

## 2013-09-23 DIAGNOSIS — R1031 Right lower quadrant pain: Secondary | ICD-10-CM

## 2013-09-23 DIAGNOSIS — N926 Irregular menstruation, unspecified: Secondary | ICD-10-CM

## 2013-09-23 DIAGNOSIS — N390 Urinary tract infection, site not specified: Secondary | ICD-10-CM

## 2013-09-23 DIAGNOSIS — N760 Acute vaginitis: Secondary | ICD-10-CM

## 2013-09-23 LAB — WET PREP, GENITAL
Clue Cells Wet Prep HPF POC: NONE SEEN
Trich, Wet Prep: NONE SEEN
Yeast Wet Prep HPF POC: NONE SEEN

## 2013-09-23 LAB — POCT URINALYSIS DIPSTICK
Bilirubin, UA: NEGATIVE
Glucose, UA: NEGATIVE
Ketones, UA: NEGATIVE
Leukocytes, UA: NEGATIVE
Nitrite, UA: NEGATIVE
Protein, UA: NEGATIVE
Spec Grav, UA: >=1.03
Urobilinogen, UA: 0.2
pH, UA: 6

## 2013-09-23 NOTE — Progress Notes (Signed)
  Subjective:    Patient ID: Godfrey Pick, female    DOB: 1961/08/10, 52 y.o.   MRN: [id number]  HPI Patient is a 52 yo AAF with chief complaint of dysuria and vaginal itching. She has been treated for 3 UTI's in the past 2 months. She is concerned why she is getting these symptoms more frequently. Infection seems to always start out the same with some low abdominal pressure and small sharp pains. Her urinary frequency has also increased. She denies any vaginal discharge just some itching. She feels like she is starting menopause and wants to be tested. She denies starting new medications or new sexual partner. She just finished her menstrual cycle but her cycle has started to become more and more irregular. Denies any fever, chills, low back pain,n/v/d. Not tried anything to make better.     Review of Systems     Objective:   Physical Exam  Constitutional: She is oriented to person, place, and time. She appears well-developed and well-nourished.  HENT:  Head: Normocephalic and atraumatic.  Cardiovascular: Normal rate, regular rhythm and normal heart sounds.   Pulmonary/Chest: Effort normal and breath sounds normal.  No CVA tenderness.  Abdominal: Soft. Bowel sounds are normal. There is no tenderness.  Neurological: She is alert and oriented to person, place, and time.  Skin: Skin is warm and dry.  Psychiatric: She has a normal mood and affect. Her behavior is normal.          Assessment & Plan:  Abdominal pressure/sharp pains/vaginal itching- UA positive for blood in urine. Negative for any other signs of infection. Will culture. If negative recheck urine in 2 weeks with reflex microscopic and consider Urology referral for hematuria/IC work up. Wet prep put in stat today. Discussed causes of UTI's. Pee before and after sex. Call if symptoms worsen of do not improve.   Irregular periods- Will check FSH at request. Suggested with patient to f/u with PCP for this discussion.

## 2013-09-23 NOTE — Patient Instructions (Signed)
Probiotics consider daily for GI health.

## 2013-09-24 LAB — FOLLICLE STIMULATING HORMONE: FSH: 3.3 m[IU]/mL

## 2013-09-25 ENCOUNTER — Encounter: Payer: Self-pay | Admitting: Physician Assistant

## 2013-09-25 ENCOUNTER — Ambulatory Visit (INDEPENDENT_AMBULATORY_CARE_PROVIDER_SITE_OTHER): Payer: Medicare Other | Admitting: Physician Assistant

## 2013-09-25 ENCOUNTER — Ambulatory Visit: Payer: Medicare Other | Admitting: Physician Assistant

## 2013-09-25 VITALS — BP 128/72 | HR 97 | Temp 98.2°F | Wt 174.0 lb

## 2013-09-25 DIAGNOSIS — H6691 Otitis media, unspecified, right ear: Secondary | ICD-10-CM

## 2013-09-25 DIAGNOSIS — H6690 Otitis media, unspecified, unspecified ear: Secondary | ICD-10-CM

## 2013-09-25 LAB — URINE CULTURE
Colony Count: NO GROWTH
Organism ID, Bacteria: NO GROWTH

## 2013-09-25 MED ORDER — DOXYCYCLINE HYCLATE 100 MG PO CAPS: 100.0000 mg | ORAL_CAPSULE | Freq: Two times a day (BID) | ORAL | Status: DC

## 2013-09-25 NOTE — Progress Notes (Signed)
  Subjective:    Patient ID: Janet Richards, female    DOB: 01/22/1961, 52 y.o.   MRN: [id number]  HPI Patient is a 52 year old African American female who presents to the clinic with right ear pain, sore throat and headache. She's had these symptoms for the past 3-4 days and they continued to worsen. She has not tried anything to make better and nothing seems to make worse. She denies any fever, chills, nausea, vomiting or diarrhea. She has not had a cough or any respiratory symptoms. She was seen in the office 2 days ago for dysuria and vaginal itching. That has since resolved on its own with no treatment.   Review of Systems     Objective:   Physical Exam  Constitutional: She is oriented to person, place, and time. She appears well-developed and well-nourished.  HENT:  Head: Normocephalic and atraumatic.  Left Ear: External ear normal.  Nose: Nose normal.  Mouth/Throat: Oropharynx is clear and moist.  Right ear impacted with cerumen. Nurse irrigated in office. TM buldging with erythema and dullness to membrane.   Left TM clear.   No maxillary/frontal sinus tenderness.   Eyes: Conjunctivae are normal.  Neck: Normal range of motion. Neck supple.  Cardiovascular: Normal rate, regular rhythm and normal heart sounds.   Pulmonary/Chest: Effort normal and breath sounds normal. She has no wheezes.  Lymphadenopathy:    She has no cervical adenopathy.  Neurological: She is alert and oriented to person, place, and time.  Skin: Skin is warm and dry.  Psychiatric: She has a normal mood and affect. Her behavior is normal.          Assessment & Plan:  Right otitis media-treated with doxycycline do to penicillin and erythromycin allergy. Gave patient handout on symptomatic care. The patient to take ibuprofen or Tylenol for any pain. Call office if not improving or certainly worsening.

## 2013-09-25 NOTE — Patient Instructions (Signed)

## 2013-10-08 ENCOUNTER — Encounter: Payer: Self-pay | Admitting: Family Medicine

## 2013-10-08 ENCOUNTER — Ambulatory Visit (INDEPENDENT_AMBULATORY_CARE_PROVIDER_SITE_OTHER): Payer: Medicare Other | Admitting: Family Medicine

## 2013-10-08 VITALS — BP 140/87 | HR 83 | Temp 98.4°F | Wt 174.0 lb

## 2013-10-08 DIAGNOSIS — H6591 Unspecified nonsuppurative otitis media, right ear: Secondary | ICD-10-CM

## 2013-10-08 DIAGNOSIS — L505 Cholinergic urticaria: Secondary | ICD-10-CM

## 2013-10-08 DIAGNOSIS — H6590 Unspecified nonsuppurative otitis media, unspecified ear: Secondary | ICD-10-CM

## 2013-10-08 MED ORDER — PREDNISONE 20 MG PO TABS: ORAL_TABLET | ORAL | Status: DC

## 2013-10-08 NOTE — Progress Notes (Signed)
CC: Janet Richards is a 52 y.o. female is here for Medication Management   Subjective: HPI:  Followup right otitis media: She has completed doxycycline she would like to know if her ear is completely healed. She denies any right ear pain, hearing loss, fevers, chills, nor any upper respiratory discomfort.  Followup cholinergic urticaria: She had to start a prednisone taper one week ago due to hives that were persisting in her arm and her chest for one week. Symptoms resolved 1 days after starting prednisone taper. She denies wheezing, mild swelling, nor any skin abnormalities or complaints today   Review Of Systems Outlined In HPI  Past Medical History  Diagnosis Date  . Urticaria   . Essential hypertension, benign   . Primary osteoarthritis of both knees   . Exertional chest pain   . Menstrual migraine      Family History  Problem Relation Age of Onset  . Hypertension Mother      History  Substance Use Topics  . Smoking status: Former Smoker    Quit date: 02/05/2006  . Smokeless tobacco: Never Used  . Alcohol Use: Yes     Objective: Filed Vitals:   10/08/13 1355  BP: 140/87  Pulse: 83  Temp: 98.4 F (36.9 C)    General: Alert and Oriented, No Acute Distress HEENT: Pupils equal, round, reactive to light. Conjunctivae clear.  External ears unremarkable, canals clear with intact TMs with appropriate landmarks.  Middle ear appears open without effusion. Pink inferior turbinates.  Moist mucous membranes, pharynx without inflammation nor lesions.  Neck supple without palpable lymphadenopathy nor abnormal masses. Lungs: Clear to auscultation bilaterally, no wheezing/ronchi/rales.  Comfortable work of breathing. Good air movement. Mental Status: No depression, anxiety, nor agitation. Skin: Warm and dry.  Assessment & Plan: Janet Richards was seen today for medication management.  Diagnoses and associated orders for this visit:  Cholinergic urticaria - predniSONE  (DELTASONE) 20 MG tablet; Three tabs daily days 1-3, two tabs daily days 4-6, one tab daily days 7-9, half tab daily days 10-13.  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

## 2013-10-16 ENCOUNTER — Encounter: Payer: Self-pay | Admitting: Family Medicine

## 2013-10-16 ENCOUNTER — Ambulatory Visit (INDEPENDENT_AMBULATORY_CARE_PROVIDER_SITE_OTHER): Payer: Medicare Other | Admitting: Family Medicine

## 2013-10-16 VITALS — BP 126/75 | HR 97 | Wt 175.0 lb

## 2013-10-16 DIAGNOSIS — H9209 Otalgia, unspecified ear: Secondary | ICD-10-CM

## 2013-10-16 DIAGNOSIS — H9201 Otalgia, right ear: Secondary | ICD-10-CM

## 2013-10-16 MED ORDER — NEOMYCIN-POLYMYXIN-HC 1 % OT SOLN: OTIC | Status: AC

## 2013-10-16 NOTE — Progress Notes (Signed)
CC: Janet Richards is a 52 y.o. female is here for tickle in throat   Subjective: HPI:  3 days of right ear pain described as tingling mild in severity improves with scratching of the ear no radiation of pain. No interventions as of yet. Present all hours the day. Persistent. Came on gradually. Denies fevers, chills, hearing loss, nor ear discharge   Review Of Systems Outlined In HPI  Past Medical History  Diagnosis Date  . Urticaria   . Essential hypertension, benign   . Primary osteoarthritis of both knees   . Exertional chest pain   . Menstrual migraine      Family History  Problem Relation Age of Onset  . Hypertension Mother      History  Substance Use Topics  . Smoking status: Former Smoker    Quit date: 02/05/2006  . Smokeless tobacco: Never Used  . Alcohol Use: Yes     Objective: Filed Vitals:   10/16/13 1101  BP: 126/75  Pulse: 97    General: Alert and Oriented, No Acute Distress HEENT: Pupils equal, round, reactive to light. Conjunctivae clear.  Left external ear unremarkable right external ear with mild erythema, canals clear with intact TMs with appropriate landmarks.  Middle ear appears open without effusion. Pink inferior turbinates.  Moist mucous membranes, pharynx without inflammation nor lesions.  Neck supple without palpable lymphadenopathy nor abnormal masses. Lungs: Clear to auscultation bilaterally, no wheezing/ronchi/rales.  Comfortable work of breathing. Good air movement.  Assessment & Plan: Janet Richards was seen today for tickle in throat.  Diagnoses and associated orders for this visit:  Right ear pain - NEOMYCIN-POLYMYXIN-HYDROCORTISONE (CORTISPORIN) 1 % SOLN otic solution; Four drops in affected ear(s) three times a day, keep in ear(s) for five minutes. Total of ten days.    Right ear pain most likely due to otitis externa start Cortisporin  Return if symptoms worsen or fail to improve.

## 2013-10-18 ENCOUNTER — Telehealth: Payer: Self-pay | Admitting: Family Medicine

## 2013-10-21 ENCOUNTER — Ambulatory Visit (INDEPENDENT_AMBULATORY_CARE_PROVIDER_SITE_OTHER): Payer: Medicare Other | Admitting: Family Medicine

## 2013-10-21 ENCOUNTER — Encounter: Payer: Self-pay | Admitting: Family Medicine

## 2013-10-21 VITALS — BP 113/72 | HR 88 | Wt 176.0 lb

## 2013-10-21 DIAGNOSIS — T7840XA Allergy, unspecified, initial encounter: Secondary | ICD-10-CM

## 2013-10-21 DIAGNOSIS — K047 Periapical abscess without sinus: Secondary | ICD-10-CM

## 2013-10-21 DIAGNOSIS — K044 Acute apical periodontitis of pulpal origin: Secondary | ICD-10-CM

## 2013-10-21 MED ORDER — METRONIDAZOLE 500 MG PO TABS: 500.0000 mg | ORAL_TABLET | Freq: Three times a day (TID) | ORAL | Status: AC

## 2013-10-21 MED ORDER — PREDNISONE 20 MG PO TABS: ORAL_TABLET | ORAL | Status: AC

## 2013-10-21 NOTE — Progress Notes (Signed)
CC: Janet Richards is a 52 y.o. female is here for possible allergic reaction   Subjective: HPI:  Patient complains of redness and itching localized on the right ear extending downward on her lateral neck. Symptoms came on the left 24 hours after she started Cortisporin for otitis externa. She has stopped this medication yesterday. No interventions as of yet. She denies any changes to personal care products or metal objects near the outbreak.  She unfortunately also chipped tooth in the back right lower molars it is quite painful right now worse with chewing. This happened 2 days ago. Pain is described only has pain in nonradiating. She denies fevers, chills, dysphagia, wheezing, flushing, nor GI disturbance   Review Of Systems Outlined In HPI  Past Medical History  Diagnosis Date  . Urticaria   . Essential hypertension, benign   . Primary osteoarthritis of both knees   . Exertional chest pain   . Menstrual migraine      Family History  Problem Relation Age of Onset  . Hypertension Mother      History  Substance Use Topics  . Smoking status: Former Smoker    Quit date: 02/05/2006  . Smokeless tobacco: Never Used  . Alcohol Use: Yes     Objective: Filed Vitals:   10/21/13 1404  BP: 113/72  Pulse: 88    General: Alert and Oriented, No Acute Distress HEENT: Pupils equal, round, reactive to light. Conjunctivae clear.  External ears unremarkable, canals clear with intact TMs with appropriate landmarks.  Middle ear appears open without effusion. Pink inferior turbinates.  Moist mucous membranes, pharynx without inflammation nor lesions.  Neck supple without palpable lymphadenopathy nor abnormal masses. Right posterior inferior molar missing half of the body majority of what is erupted beyond the gumline. Lungs: Clear to auscultation bilaterally, no wheezing/ronchi/rales.  Comfortable work of breathing. Good air movement. Cardiac: Regular rate and rhythm. Normal S1/S2.  No  murmurs, rubs, nor gallops.   Mental Status: No depression, anxiety, nor agitation. Skin: Warm and dry. Macular papular rash extending from the right external ear canal extending downward fanning down over the lateral neck stopping near the right clavicle, overlying excoriations  Assessment & Plan: Janet Richards was seen today for possible allergic reaction.  Diagnoses and associated orders for this visit:  Dental infection - metroNIDAZOLE (FLAGYL) 500 MG tablet; Take 1 tablet (500 mg total) by mouth 3 (three) times daily.  Allergic reaction, initial encounter - predniSONE (DELTASONE) 20 MG tablet; Three tabs daily days 1-3, two tabs daily days 4-6, one tab daily days 7-9, half tab daily days 10-13.    Dental infection: Start Flagyl seek out dental evaluation as soon as possible for teeth removal Allergic reaction: Adding Cortisporin to her allergy list, discussed the etiology of her hypersensitivity reaction start prednisone taper  Return if symptoms worsen or fail to improve.

## 2013-11-01 ENCOUNTER — Ambulatory Visit: Payer: Medicare Other | Admitting: Family Medicine

## 2013-11-01 ENCOUNTER — Telehealth: Payer: Self-pay | Admitting: *Deleted

## 2013-11-01 DIAGNOSIS — K0889 Other specified disorders of teeth and supporting structures: Secondary | ICD-10-CM

## 2013-11-01 MED ORDER — AMOXICILLIN-POT CLAVULANATE 500-125 MG PO TABS: ORAL_TABLET | ORAL | Status: AC

## 2013-11-01 NOTE — Telephone Encounter (Signed)
Amoxicillin-clavulanic acid has worked for her in the past.  Sent to [facility]

## 2013-11-01 NOTE — Telephone Encounter (Signed)
Pt would like an abx for a tooth infection.

## 2013-11-05 ENCOUNTER — Telehealth: Payer: Self-pay | Admitting: *Deleted

## 2013-11-05 MED ORDER — FLUCONAZOLE 150 MG PO TABS: 150.0000 mg | ORAL_TABLET | Freq: Every day | ORAL | Status: DC

## 2013-11-05 NOTE — Telephone Encounter (Signed)
Pt called and states the abx rx'ed is causing a yeast infection. We can send in Diflucan to pt's pharm. Will notify if pt that this was sent in and if it doesn't clear up then she will need an appt

## 2013-11-08 ENCOUNTER — Ambulatory Visit: Payer: Medicare Other | Admitting: Family Medicine

## 2013-11-08 ENCOUNTER — Ambulatory Visit (INDEPENDENT_AMBULATORY_CARE_PROVIDER_SITE_OTHER): Payer: Medicare Other | Admitting: Family Medicine

## 2013-11-08 VITALS — BP 103/72 | HR 99 | Wt 175.0 lb

## 2013-11-08 DIAGNOSIS — N760 Acute vaginitis: Secondary | ICD-10-CM

## 2013-11-08 NOTE — Progress Notes (Signed)
   Subjective:    Patient ID: Janet Richards, female    DOB: Jul 12, 1961, 53 y.o.   MRN: [id number]  HPI   Presents with vaginal itching that started after completing a course of abx. Per standing order a rx for Diflucan was sent in which did help some. Pt states the itching calmed down some but it came back after a day or so.Pt denies odor or cottage cheese like discharge. Review of Systems     Objective:   Physical Exam        Assessment & Plan:  Pt did a self wet prep in the office

## 2013-11-08 NOTE — Progress Notes (Signed)
Awaiting results

## 2013-11-11 ENCOUNTER — Telehealth: Payer: Self-pay | Admitting: Family Medicine

## 2013-11-11 LAB — WET PREP BY MOLECULAR PROBE
Candida species: POSITIVE — AB
Gardnerella vaginalis: NEGATIVE
Trichomonas vaginosis: NEGATIVE

## 2013-11-11 MED ORDER — FLUCONAZOLE 150 MG PO TABS: ORAL_TABLET | ORAL | Status: AC

## 2013-11-11 NOTE — Telephone Encounter (Signed)
Pt.notified

## 2013-11-11 NOTE — Telephone Encounter (Signed)
Seth Bake, Will you please let Janet Richards know that her swab from last week showed evidence of a yeast infection.  [facility] in case she's still experiencing symptoms.

## 2013-11-22 ENCOUNTER — Encounter: Payer: Self-pay | Admitting: Family Medicine

## 2013-11-22 ENCOUNTER — Ambulatory Visit (INDEPENDENT_AMBULATORY_CARE_PROVIDER_SITE_OTHER): Payer: Medicare Other | Admitting: Family Medicine

## 2013-11-22 VITALS — BP 121/63 | HR 92 | Wt 180.0 lb

## 2013-11-22 DIAGNOSIS — K089 Disorder of teeth and supporting structures, unspecified: Secondary | ICD-10-CM

## 2013-11-22 DIAGNOSIS — K0889 Other specified disorders of teeth and supporting structures: Secondary | ICD-10-CM

## 2013-11-22 MED ORDER — METHOCARBAMOL 500 MG PO TABS: 500.0000 mg | ORAL_TABLET | Freq: Three times a day (TID) | ORAL | Status: DC | PRN

## 2013-11-22 NOTE — Progress Notes (Signed)
CC: Janet Richards is a 53 y.o. female is here for tooth abscess   Subjective: HPI:  Right lower posterior dental pain that has been present for 3 days that has been completely alleviated by taking Robaxin  Twice a day however pain will return 11-12 hours after taking a dose. When pain is present as described as moderate, nonradiating, described only has pain worse with anything touching her lower posterior teeth on the right. She denies any ear pain, dysphagia, fevers, chills, nor drainage from her teeth or gums.   Review Of Systems Outlined In HPI  Past Medical History  Diagnosis Date  . Urticaria   . Essential hypertension, benign   . Primary osteoarthritis of both knees   . Exertional chest pain   . Menstrual migraine      Family History  Problem Relation Age of Onset  . Hypertension Mother      History  Substance Use Topics  . Smoking status: Former Smoker    Quit date: 02/05/2006  . Smokeless tobacco: Never Used  . Alcohol Use: Yes     Objective: Filed Vitals:   11/22/13 1328  BP: 121/63  Pulse: 92    General: Alert and Oriented, No Acute Distress HEENT: Pupils equal, round, reactive to light. Conjunctivae clear.  External ears unremarkable, canals clear with intact TMs with appropriate landmarks.  Middle ear appears open without effusion. Pink inferior turbinates.  Moist mucous membranes, pharynx without inflammation nor lesions. Right posterior inferior molar is missing half of itself was pulled exposed. Neck supple without palpable lymphadenopathy nor abnormal masses. Lungs: Clear to auscultation bilaterally, no wheezing/ronchi/rales.  Comfortable work of breathing. Good air movement. Mental Status: No depression, anxiety, nor agitation. Skin: Warm and dry.  Assessment & Plan: Janet Richards was seen today for tooth abscess.  Diagnoses and associated orders for this visit:  Pain, dental - methocarbamol (ROBAXIN) 500 MG tablet; Take 1 tablet (500 mg total) by  mouth every 8 (eight) hours as needed for muscle spasms.    Dental pain: Not quite sure why she is getting so much relief with a muscle relaxer however are both pretty happy she is pain-free taking this current regimen therefore we will refill Robaxin, she plans to have this tooth pulled at the end of the month Janet Richards has an appointment with her dentist.Signs and symptoms requring emergent/urgent reevaluation were discussed with the patient.  Return if symptoms worsen or fail to improve.

## 2013-11-26 ENCOUNTER — Telehealth: Payer: Self-pay | Admitting: *Deleted

## 2013-11-26 MED ORDER — METRONIDAZOLE 500 MG PO TABS: 500.0000 mg | ORAL_TABLET | Freq: Three times a day (TID) | ORAL | Status: AC

## 2013-11-26 NOTE — Telephone Encounter (Signed)
Pt.notified

## 2013-11-26 NOTE — Telephone Encounter (Signed)
Pt left a vm stating that medicare will not pay for the robaxin. Pt requests metronidazole called in

## 2013-11-26 NOTE — Telephone Encounter (Signed)
[facility] [address] [address] Janet Richards

## 2013-12-06 ENCOUNTER — Telehealth: Payer: Self-pay | Admitting: *Deleted

## 2013-12-06 ENCOUNTER — Other Ambulatory Visit: Payer: Self-pay | Admitting: Family Medicine

## 2013-12-06 MED ORDER — PREDNISONE 20 MG PO TABS: ORAL_TABLET | ORAL | Status: AC

## 2013-12-06 NOTE — Telephone Encounter (Signed)
Pt called requesting a refill of prednisone. She states she is breaking out in hives.

## 2013-12-06 NOTE — Telephone Encounter (Signed)
Pt.notified

## 2013-12-06 NOTE — Telephone Encounter (Signed)
[facility] [address]

## 2014-01-06 ENCOUNTER — Ambulatory Visit (INDEPENDENT_AMBULATORY_CARE_PROVIDER_SITE_OTHER): Payer: Medicare Other | Admitting: Physician Assistant

## 2014-01-06 ENCOUNTER — Encounter: Payer: Self-pay | Admitting: Physician Assistant

## 2014-01-06 VITALS — BP 119/74 | HR 92 | Wt 180.0 lb

## 2014-01-06 DIAGNOSIS — J45909 Unspecified asthma, uncomplicated: Secondary | ICD-10-CM

## 2014-01-06 DIAGNOSIS — J040 Acute laryngitis: Secondary | ICD-10-CM

## 2014-01-06 DIAGNOSIS — J209 Acute bronchitis, unspecified: Secondary | ICD-10-CM

## 2014-01-06 MED ORDER — METHYLPREDNISOLONE SODIUM SUCC 125 MG IJ SOLR
125.0000 mg | Freq: Once | INTRAMUSCULAR | Status: AC
  Administered 2014-01-06: 125 mg via INTRAMUSCULAR

## 2014-01-06 MED ORDER — AZITHROMYCIN 250 MG PO TABS: ORAL_TABLET | ORAL | Status: DC

## 2014-01-06 NOTE — Progress Notes (Signed)
   Subjective:    Patient ID: Janet Richards, female    DOB: Nov 29, 1960, 53 y.o.   MRN: [id number]  HPI Pt presents to the clinic with cough, wheezing, chest tightness for over a week. Approximately one month ago pt was on augmentin for dental infection. She has tried OTC allergy pills and albuterol which is helping some. She continues to have ear congestion. She is coughing up phelym that is green in color. She denies fever, chills, body aches. She has lost her voice.    Review of Systems     Objective:   Physical Exam  Constitutional: She is oriented to person, place, and time. She appears well-developed and well-nourished.  HENT:  Head: Normocephalic and atraumatic.  Right Ear: External ear normal.  Left Ear: External ear normal.  Nose: Nose normal.  Mouth/Throat: Oropharynx is clear and moist.  Eyes: Conjunctivae are normal. Right eye exhibits no discharge. Left eye exhibits no discharge.  Neck: Normal range of motion. Neck supple.  Cardiovascular: Normal rate, regular rhythm and normal heart sounds.   Pulmonary/Chest:  Wheezing at base of bilateral lungs. No rhonchi.   Lymphadenopathy:    She has no cervical adenopathy.  Neurological: She is alert and oriented to person, place, and time.  Skin: Skin is dry.  Psychiatric: She has a normal mood and affect. Her behavior is normal.          Assessment & Plan:  Acute asthmatic bronchitis- peak flows in yellow. Albuterol nebulizer in office given. Started on zpak. Shot of solumedrol 125mg  in office given. Continue on albuterol every 4-6 hours. Gave HO on other symptomatic care. Continue on flonase. Call if not improving.   Laryngitis- discussed symptomatic care and gave HO.

## 2014-01-06 NOTE — Patient Instructions (Addendum)
zpak given. Continue to use albuterol inhaler as needed up to every 4-6 hours.   Acute Bronchitis Bronchitis is inflammation of the airways that extend from the windpipe into the lungs (bronchi). The inflammation often causes mucus to develop. This leads to a cough, which is the most common symptom of bronchitis.  In acute bronchitis, the condition usually develops suddenly and goes away over time, usually in a couple weeks. Smoking, allergies, and asthma can make bronchitis worse. Repeated episodes of bronchitis may cause further lung problems.  CAUSES Acute bronchitis is most often caused by the same virus that causes a cold. The virus can spread from person to person (contagious).  SIGNS AND SYMPTOMS   Cough.   Fever.   Coughing up mucus.   Body aches.   Chest congestion.   Chills.   Shortness of breath.   Sore throat.  DIAGNOSIS  Acute bronchitis is usually diagnosed through a physical exam. Tests, such as chest X-rays, are sometimes done to rule out other conditions.  TREATMENT  Acute bronchitis usually goes away in a couple weeks. Often times, no medical treatment is necessary. Medicines are sometimes given for relief of fever or cough. Antibiotics are usually not needed but may be prescribed in certain situations. In some cases, an inhaler may be recommended to help reduce shortness of breath and control the cough. A cool mist vaporizer may also be used to help thin bronchial secretions and make it easier to clear the chest.  HOME CARE INSTRUCTIONS  Get plenty of rest.   Drink enough fluids to keep your urine clear or pale yellow (unless you have a medical condition that requires fluid restriction). Increasing fluids may help thin your secretions and will prevent dehydration.   Only take over-the-counter or prescription medicines as directed by your health care provider.   Avoid smoking and secondhand smoke. Exposure to cigarette smoke or irritating chemicals  will make bronchitis worse. If you are a smoker, consider using nicotine gum or skin patches to help control withdrawal symptoms. Quitting smoking will help your lungs heal faster.   Reduce the chances of another bout of acute bronchitis by washing your hands frequently, avoiding people with cold symptoms, and trying not to touch your hands to your mouth, nose, or eyes.   Follow up with your health care provider as directed.  SEEK MEDICAL CARE IF: Your symptoms do not improve after 1 week of treatment.  SEEK IMMEDIATE MEDICAL CARE IF:  You develop an increased fever or chills.   You have chest pain.   You have severe shortness of breath.  You have bloody sputum.   You develop dehydration.  You develop fainting.  You develop repeated vomiting.  You develop a severe headache. MAKE SURE YOU:   Understand these instructions.  Will watch your condition.  Will get help right away if you are not doing well or get worse. Document Released: 12/01/2004 Document Revised: 06/26/2013 Document Reviewed: 04/16/2013 [facility] Patient Information 2014 [place].   Laryngitis At the top of your windpipe is your voice box. It is the source of your voice. Inside your voice box are 2 bands of muscles called vocal cords. When you breathe, your vocal cords are relaxed and open so that air can get into the lungs. When you decide to say something, these cords come together and vibrate. The sound from these vibrations goes into your throat and comes out through your mouth as sound. Laryngitis is an inflammation of the vocal cords that  causes hoarseness, cough, loss of voice, sore throat, and dry throat. Laryngitis can be temporary (acute) or long-term (chronic). Most cases of acute laryngitis improve with time.Chronic laryngitis lasts for more than 3 weeks. CAUSES Laryngitis can often be related to excessive smoking, talking, or yelling, as well as inhalation of toxic fumes and allergies.  Acute laryngitis is usually caused by a viral infection, vocal strain, measles or mumps, or bacterial infections. Chronic laryngitis is usually caused by vocal cord strain, vocal cord injury, postnasal drip, growths on the vocal cords, or acid reflux. SYMPTOMS   Cough.  Sore throat.  Dry throat. RISK FACTORS  Respiratory infections.  Exposure to irritating substances, such as cigarette smoke, excessive amounts of alcohol, stomach acids, and workplace chemicals.  Voice trauma, such as vocal cord injury from shouting or speaking too loud. DIAGNOSIS  Your cargiver will perform a physical exam. During the physical exam, your caregiver will examine your throat. The most common sign of laryngitis is hoarseness. Laryngoscopy may be necessary to confirm the diagnosis of this condition. This procedure allows your caregiver to look into the larynx. HOME CARE INSTRUCTIONS  Drink enough fluids to keep your urine clear or pale yellow.  Rest until you no longer have symptoms or as directed by your caregiver.  Breathe in moist air.  Take all medicine as directed by your caregiver.  Do not smoke.  Talk as little as possible (this includes whispering).  Write on paper instead of talking until your voice is back to normal.  Follow up with your caregiver if your condition has not improved after 10 days. SEEK MEDICAL CARE IF:   You have trouble breathing.  You cough up blood.  You have persistent fever.  You have increasing pain.  You have difficulty swallowing. MAKE SURE YOU:  Understand these instructions.  Will watch your condition.  Will get help right away if you are not doing well or get worse. Document Released: 10/24/2005 Document Revised: 01/16/2012 Document Reviewed: 12/30/2010 [facility] Patient Information 2014 [place], [place].

## 2014-01-07 ENCOUNTER — Ambulatory Visit: Payer: Medicare Other | Admitting: Family Medicine

## 2014-01-17 ENCOUNTER — Ambulatory Visit: Payer: Medicare Other | Admitting: Family Medicine

## 2014-01-20 ENCOUNTER — Encounter: Payer: Self-pay | Admitting: Family Medicine

## 2014-01-20 ENCOUNTER — Ambulatory Visit (INDEPENDENT_AMBULATORY_CARE_PROVIDER_SITE_OTHER): Payer: Medicare Other | Admitting: Family Medicine

## 2014-01-20 VITALS — BP 144/89 | HR 82 | Wt 182.0 lb

## 2014-01-20 DIAGNOSIS — K047 Periapical abscess without sinus: Secondary | ICD-10-CM

## 2014-01-20 DIAGNOSIS — J3489 Other specified disorders of nose and nasal sinuses: Secondary | ICD-10-CM

## 2014-01-20 DIAGNOSIS — I10 Essential (primary) hypertension: Secondary | ICD-10-CM

## 2014-01-20 DIAGNOSIS — K044 Acute apical periodontitis of pulpal origin: Secondary | ICD-10-CM

## 2014-01-20 DIAGNOSIS — R0981 Nasal congestion: Secondary | ICD-10-CM

## 2014-01-20 MED ORDER — BECLOMETHASONE DIPROPIONATE 80 MCG/ACT NA AERS: INHALATION_SPRAY | NASAL | Status: DC

## 2014-01-20 MED ORDER — METRONIDAZOLE 500 MG PO TABS: 500.0000 mg | ORAL_TABLET | Freq: Three times a day (TID) | ORAL | Status: DC

## 2014-01-20 NOTE — Progress Notes (Signed)
CC: Janet Richards is a 52 y.o. female is here for Sore Throat and Otalgia   Subjective: HPI:  Complains of right-sided ear pain that has been present for the past 3-5 days on a daily basis. Pain seems to be connected to the right lower jaw involving her teeth. Symptoms are worse with chewing she denies clicking or popping in the right jaw region. Symptoms are mild to moderate in severity have not improved with over-the-counter anti-inflammatories. Accompanied by mild nasal congestion and subjective postnasal drip. Denies fevers, chills, cough, shortness of breath, wheezing, facial pain.  Essential hypertension: I pointed out that her blood pressure is elevated today and has been on visits in the past she states she's taking her hydrochlorothiazide intermittently without outside blood pressures to report. She takes this medication only if she feels she is retaining fluid or getting headaches. There's been no chest pain since I saw her last   Review Of Systems Outlined In HPI  Past Medical History  Diagnosis Date  . Urticaria   . Essential hypertension, benign   . Primary osteoarthritis of both knees   . Exertional chest pain   . Menstrual migraine     Past Surgical History  Procedure Laterality Date  . Tubal ligation  12/26/1987   Family History  Problem Relation Age of Onset  . Hypertension Mother     History   Social History  . Marital Status: Married    Spouse Name: N/A    Number of Children: N/A  . Years of Education: N/A   Occupational History  . Not on file.   Social History Main Topics  . Smoking status: Former Smoker    Quit date: 02/05/2006  . Smokeless tobacco: Never Used  . Alcohol Use: Yes  . Drug Use: No  . Sexual Activity: Not Currently   Other Topics Concern  . Not on file   Social History Narrative  . No narrative on file     Objective: BP 144/89  Pulse 82  Wt 182 lb (82.555 kg)  General: Alert and Oriented, No Acute Distress HEENT:  Pupils equal, round, reactive to light. Conjunctivae clear.  External ears unremarkable, canals clear with intact TMs with appropriate landmarks.  Middle ear appears open without effusion. Pink inferior turbinates.  Moist mucous membranes, pharynx without inflammation nor lesions however mild postnasal drip.  Neck supple without palpable lymphadenopathy nor abnormal masses. Poor dentition with pain reproduced when palpating inferior posterior most right molar which is missing approximately 80% of the erupted body Lungs: Clear to auscultation bilaterally, no wheezing/ronchi/rales.  Comfortable work of breathing. Good air movement. Extremities: No peripheral edema.  Strong peripheral pulses.  Mental Status: No depression, anxiety, nor agitation. Skin: Warm and dry.  Assessment & Plan: Janet Richards was seen today for sore throat and otalgia.  Diagnoses and associated orders for this visit:  Dental infection - metroNIDAZOLE (FLAGYL) 500 MG tablet; Take 1 tablet (500 mg total) by mouth 3 (three) times daily.  Nasal congestion  Essential hypertension, benign  Other Orders - Beclomethasone Dipropionate (QNASL) 80 MCG/ACT AERS; Two sprays each nostril daily.    Essential hypertension: Uncontrolled chronic condition I've encouraged her to take hydrochlorothiazide on a daily basis regardless of presence of headache/fluid retention as this is primarily to treat blood pressure which rarely has symptoms when elevated, Dental infection: Metronidazole was begun continue anti-inflammatories as needed stressed the importance of having this removed as soon as possible. I think this is the primary cause of her  right ear pain Nasal congestion: Start Qnasl instead of Flonase for the next week call if nasal congestion improves for formal prescription  Return if symptoms worsen or fail to improve.

## 2014-01-22 ENCOUNTER — Telehealth: Payer: Self-pay | Admitting: Family Medicine

## 2014-01-22 DIAGNOSIS — J309 Allergic rhinitis, unspecified: Secondary | ICD-10-CM

## 2014-01-22 MED ORDER — BECLOMETHASONE DIPROPIONATE 80 MCG/ACT NA AERS: INHALATION_SPRAY | NASAL | Status: DC

## 2014-01-22 NOTE — Telephone Encounter (Signed)
Patient called and advised that she would like to get a prescription for the nasal spray sent to her pharmacy- Thanks

## 2014-01-22 NOTE — Telephone Encounter (Signed)
Seth Bake, Can you please relay that Rx was sent to her pharmacy, if she does not still have the coupoun card that came with the sample she'll want to pick up a card that I've placed in my pen holder on the counter prior to picking up her Rx.

## 2014-01-22 NOTE — Telephone Encounter (Signed)
Pt.notified

## 2014-01-29 ENCOUNTER — Telehealth: Payer: Self-pay | Admitting: *Deleted

## 2014-01-29 MED ORDER — PREDNISONE 20 MG PO TABS: ORAL_TABLET | ORAL | Status: AC

## 2014-01-29 NOTE — Telephone Encounter (Signed)
[facility] [address] [facility] [place]

## 2014-01-29 NOTE — Telephone Encounter (Signed)
Pt requests a rx for prednisone. She states she has been breaking out in hives for the past week

## 2014-01-29 NOTE — Telephone Encounter (Signed)
Pt.notified

## 2014-02-06 ENCOUNTER — Other Ambulatory Visit: Payer: Self-pay | Admitting: Family Medicine

## 2014-02-13 ENCOUNTER — Encounter: Payer: Self-pay | Admitting: Family Medicine

## 2014-02-13 ENCOUNTER — Ambulatory Visit (INDEPENDENT_AMBULATORY_CARE_PROVIDER_SITE_OTHER): Payer: Medicare Other | Admitting: Family Medicine

## 2014-02-13 VITALS — BP 110/74 | HR 111 | Ht 63.0 in | Wt 180.0 lb

## 2014-02-13 DIAGNOSIS — K047 Periapical abscess without sinus: Secondary | ICD-10-CM

## 2014-02-13 DIAGNOSIS — K044 Acute apical periodontitis of pulpal origin: Secondary | ICD-10-CM

## 2014-02-13 MED ORDER — METRONIDAZOLE 500 MG PO TABS: 500.0000 mg | ORAL_TABLET | Freq: Three times a day (TID) | ORAL | Status: DC

## 2014-02-13 NOTE — Progress Notes (Signed)
CC: Janet Richards is a 53 y.o. female is here for Dental Pain   Subjective: HPI:  Complains of dental pain localized to the posterior lower right molars and posterior upper left molars. Symptoms have been present for the past one to 2 days. The prescription is moderate to severe in severity," toothache" nonradiating. Worse with chewing or any manipulation of either tooth. She has a dental appointment on the fifth of next month to have both of these removed. Denies fevers, chills, dysphagia, choking, confusion, nausea, vomiting   Review Of Systems Outlined In HPI  Past Medical History  Diagnosis Date  . Urticaria   . Essential hypertension, benign   . Primary osteoarthritis of both knees   . Exertional chest pain   . Menstrual migraine     Past Surgical History  Procedure Laterality Date  . Tubal ligation  12/26/1987   Family History  Problem Relation Age of Onset  . Hypertension Mother     History   Social History  . Marital Status: Married    Spouse Name: N/A    Number of Children: N/A  . Years of Education: N/A   Occupational History  . Not on file.   Social History Main Topics  . Smoking status: Former Smoker    Quit date: 02/05/2006  . Smokeless tobacco: Never Used  . Alcohol Use: Yes  . Drug Use: No  . Sexual Activity: Not Currently   Other Topics Concern  . Not on file   Social History Narrative  . No narrative on file     Objective: BP 110/74  Pulse 111  Ht 5\' 3"  (1.6 m)  Wt 180 lb (81.647 kg)  BMI 31.89 kg/m2  General: Alert and Oriented, No Acute Distress HEENT: Pupils equal, round, reactive to light. Conjunctivae clear.  External ears unremarkable, canals clear with intact TMs with appropriate landmarks.  Middle ear appears open without effusion. Pink inferior turbinates.  Moist mucous membranes, pharynx without inflammation nor lesions.  Neck supple without palpable lymphadenopathy nor abnormal masses. Poor dentition throughout, right  inferior posterior molar shows exposed pulp which is tender to the touch however no fluctuance or discharge from the gum line, similar appearance of left superior posterior molar tender to the touch without fluctuance or discharge from the gum line. Cardiac: Regular rate and rhythm.  Assessment & Plan: Janet Richards was seen today for dental pain.  Diagnoses and associated orders for this visit:  Dental infection - metroNIDAZOLE (FLAGYL) 500 MG tablet; Take 1 tablet (500 mg total) by mouth 3 (three) times daily.    Dental infection:  Kindly reminded her that these teeth need to be pulled as soon as possible to avoid future infections. Start metronidazole, Tylenol as needed for pain  Return if symptoms worsen or fail to improve.

## 2014-02-18 ENCOUNTER — Ambulatory Visit (INDEPENDENT_AMBULATORY_CARE_PROVIDER_SITE_OTHER): Payer: Medicare Other | Admitting: Family Medicine

## 2014-02-18 ENCOUNTER — Encounter: Payer: Self-pay | Admitting: Family Medicine

## 2014-02-18 VITALS — BP 127/72 | HR 94 | Wt 179.0 lb

## 2014-02-18 DIAGNOSIS — K047 Periapical abscess without sinus: Secondary | ICD-10-CM

## 2014-02-18 DIAGNOSIS — K044 Acute apical periodontitis of pulpal origin: Secondary | ICD-10-CM

## 2014-02-18 MED ORDER — FLUCONAZOLE 150 MG PO TABS: ORAL_TABLET | ORAL | Status: AC

## 2014-02-18 MED ORDER — DOXYCYCLINE HYCLATE 100 MG PO TABS: ORAL_TABLET | ORAL | Status: DC

## 2014-02-18 NOTE — Progress Notes (Signed)
CC: Janet Richards is a 53 y.o. female is here for rash from abx?   Subjective: HPI:  Followup dental infection: States the pain has significantly improved since starting metronidazole. She's fearful that she's developed a tolerance to the medication due to breaking out in hives on her posterior thighs, and both forearms.  In hindsight she realizes that these symptoms have occurred to at least a mild degree every time she takes metronidazole and improves when she stops taking medication.  She describes the hives as discrete small areas of skin swelling with redness which are raised and itchy.  Denies any fevers, chills, flushing, nausea, vomiting, throat closure nor shortness of breath.   Review Of Systems Outlined In HPI  Past Medical History  Diagnosis Date  . Urticaria   . Essential hypertension, benign   . Primary osteoarthritis of both knees   . Exertional chest pain   . Menstrual migraine     Past Surgical History  Procedure Laterality Date  . Tubal ligation  12/26/1987   Family History  Problem Relation Age of Onset  . Hypertension Mother     History   Social History  . Marital Status: Married    Spouse Name: N/A    Number of Children: N/A  . Years of Education: N/A   Occupational History  . Not on file.   Social History Main Topics  . Smoking status: Former Smoker    Quit date: 02/05/2006  . Smokeless tobacco: Never Used  . Alcohol Use: Yes  . Drug Use: No  . Sexual Activity: Not Currently   Other Topics Concern  . Not on file   Social History Narrative  . No narrative on file     Objective: BP 127/72  Pulse 94  Wt 179 lb (81.194 kg)  General: Alert and Oriented, No Acute Distress HEENT: Pupils equal, round, reactive to light. Conjunctivae clear.  Moist membranes pharynx unremarkable. Neck is supple without lymphadenopathy. Poor dentition right posterior inferior molar and left superior posterior molar Lungs: Clear to auscultation bilaterally, no  wheezing/ronchi/rales.  Comfortable work of breathing. Good air movement. Extremities: No peripheral edema.  Strong peripheral pulses.  Mental Status: No depression, anxiety, nor agitation. Skin: Warm and dry. Mild urticaria volar surface of both forearms  Assessment & Plan: Janet Richards was seen today for rash from abx?.  Diagnoses and associated orders for this visit:  Dental infection - doxycycline (VIBRA-TABS) 100 MG tablet; One by mouth twice a day for ten days. - fluconazole (DIFLUCAN) 150 MG tablet; Take one tab, may take second tab if no improvement after 72 hours.    Dental infection: Stop metronidazole, She reports Augmentin in the past has not helped with her dental infections, she reports intolerance to clindamycin, will try doxycycline. She has a tendency to get vaginal candidiasis when on antibiotics therefore doxycycline available on an as needed basis.  Return if symptoms worsen or fail to improve.

## 2014-02-24 ENCOUNTER — Telehealth: Payer: Self-pay | Admitting: *Deleted

## 2014-02-24 NOTE — Telephone Encounter (Signed)
Pt calls and left a message that  the last medications rx'ed seem to be wearing off by the end of the day and she starts itching again. Pt is  asking for another medication.Marland KitchenMarland Kitchen

## 2014-02-25 ENCOUNTER — Encounter: Payer: Self-pay | Admitting: Family Medicine

## 2014-02-25 ENCOUNTER — Ambulatory Visit: Payer: Medicare Other | Admitting: Family Medicine

## 2014-02-25 ENCOUNTER — Ambulatory Visit (INDEPENDENT_AMBULATORY_CARE_PROVIDER_SITE_OTHER): Payer: Medicare Other | Admitting: Family Medicine

## 2014-02-25 VITALS — BP 100/69 | HR 81 | Wt 180.0 lb

## 2014-02-25 DIAGNOSIS — M25569 Pain in unspecified knee: Secondary | ICD-10-CM

## 2014-02-25 DIAGNOSIS — M25562 Pain in left knee: Secondary | ICD-10-CM

## 2014-02-25 DIAGNOSIS — G8929 Other chronic pain: Secondary | ICD-10-CM | POA: Insufficient documentation

## 2014-02-25 DIAGNOSIS — K044 Acute apical periodontitis of pulpal origin: Secondary | ICD-10-CM

## 2014-02-25 DIAGNOSIS — K047 Periapical abscess without sinus: Secondary | ICD-10-CM

## 2014-02-25 MED ORDER — AMOXICILLIN-POT CLAVULANATE 500-125 MG PO TABS: ORAL_TABLET | ORAL | Status: AC

## 2014-02-25 NOTE — Telephone Encounter (Signed)
Addressed in office today

## 2014-02-25 NOTE — Progress Notes (Signed)
CC: Janet Richards is a 53 y.o. female is here for Knee Pain and Urticaria   Subjective: HPI:  Complains of bilateral knee pain for the past years that is currently moderate in severity. It has been slowly worsening from mild to moderate over the past year.  Worse with initial movement after inactivity. Improves slightly with the longer she is active. Often accompanied by mild swelling in both knees. Pain is localized deep in the knee and nonradiating. She denies any recent or remote over exertion or trauma. Symptoms have responded to steroid injections in the past. Currently left knee is worse than right. She denies any catching locking giving way nor redness or warmth in either knee over the past year. She is fearful of using over-the-counter nonsteroidal anti-inflammatories due to an aspirin allergy.  Complaint of worsening hives on the upper extremities localized to the forearms and shins that has been worsening since starting doxycycline. Symptoms are slightly improved after stopping metronidazole. She denies any change in personal care products or any other environmental changes. The hives are itchy and slightly improved with hydrocortisone cream. Denies swelling of the face, mouth, tongue, nor difficulty swallowing. No shortness of breath or wheezing    Review Of Systems Outlined In HPI  Past Medical History  Diagnosis Date  . Urticaria   . Essential hypertension, benign   . Primary osteoarthritis of both knees   . Exertional chest pain   . Menstrual migraine     Past Surgical History  Procedure Laterality Date  . Tubal ligation  12/26/1987   Family History  Problem Relation Age of Onset  . Hypertension Mother     History   Social History  . Marital Status: Married    Spouse Name: N/A    Number of Children: N/A  . Years of Education: N/A   Occupational History  . Not on file.   Social History Main Topics  . Smoking status: Former Smoker    Quit date: 02/05/2006  .  Smokeless tobacco: Never Used  . Alcohol Use: Yes  . Drug Use: No  . Sexual Activity: Not Currently   Other Topics Concern  . Not on file   Social History Narrative  . No narrative on file     Objective: BP 100/69  Pulse 81  Wt 180 lb (81.647 kg)  General: Alert and Oriented, No Acute Distress HEENT: Pupils equal, round, reactive to light. Conjunctivae clear.   moist mucous membranes pharynx unremarkable poor dentition in the majority of her molars  Lungs: Clear to auscultation bilaterally, no wheezing/ronchi/rales.  Comfortable work of breathing. Good air movement. Cardiac: Regular rate and rhythm.   Extremities: No peripheral edema.  Strong peripheral pulses. Left knee exam shows full-strength and range of motion. There is no swelling, redness, nor warmth overlying the knee.  No patellar crepitus. No patellar apprehension. No pain with palpation of the inferior patellar pole.  No pain or laxity with valgus nor varus stress. Anterior drawer is negative. McMurray's negative. No popliteal space tenderness or palpable mass. No lateral joint line tenderness to palpation however moderate reduction of her pain with palpation of medial joint line. Mental Status: No depression, anxiety, nor agitation. Skin: Warm and dry.  Assessment & Plan: Lashia was seen today for knee pain and urticaria.  Diagnoses and associated orders for this visit:  Left knee pain  Dental infection - amoxicillin-clavulanate (AUGMENTIN) 500-125 MG per tablet; Take one by mouth every 12 hours for ten total days.  Left knee pain: Most likely due to osteoarthritis given the appearance of her right knee films last year. She requests injections into both knees however we will do one at a time for safety sake. She prefers left first right can be done sometime next week if no complications from today's injection. Dental infection: Stop doxycycline instead begin Augmentin in hopes that this will not cause any hives.  Again, I've urged her to have her decaying teeth removed as soon as possible.  Knee  Injection Procedure Note  Pre-operative Diagnosis: left knee pain  Post-operative Diagnosis: same  Indications: persistent pain and swelling  Anesthesia: topical cold spray  Procedure Details   Verbal consent was obtained for the procedure. The joint was prepped with alcohol and topical cold spray was applied. A 22 gauge needle was inserted into the inferior aspect of the joint from a lateral approach. . 2 ml 1% lidocaine and 2 ml of triamcinolone (KENALOG) 40mg /ml was then injected into the joint through the same needle. The needle was removed and the area cleansed and dressed.  Complications:  None; patient tolerated the procedure well.   Return in about 1 week (around 03/04/2014).

## 2014-02-26 ENCOUNTER — Telehealth: Payer: Self-pay | Admitting: *Deleted

## 2014-02-26 DIAGNOSIS — L509 Urticaria, unspecified: Secondary | ICD-10-CM

## 2014-02-26 DIAGNOSIS — L505 Cholinergic urticaria: Secondary | ICD-10-CM

## 2014-02-26 NOTE — Telephone Encounter (Signed)
Seth Bake, Will you please let patient know I'm recommending she see a dermatologist to discuss a long term plan about preventing and managing her gives.  A referral has been placed.

## 2014-02-26 NOTE — Telephone Encounter (Signed)
Pt wants you to know she took cyclobenzaprine and that cleared her hives.Marland KitchenMarland Kitchen

## 2014-02-26 NOTE — Telephone Encounter (Signed)
Spoke to patient advised her of dermatology referral as noted below. Rhonda Cunningham,CMA

## 2014-03-03 ENCOUNTER — Ambulatory Visit (INDEPENDENT_AMBULATORY_CARE_PROVIDER_SITE_OTHER): Payer: Medicare Other | Admitting: Family Medicine

## 2014-03-03 ENCOUNTER — Encounter: Payer: Self-pay | Admitting: Family Medicine

## 2014-03-03 VITALS — BP 110/73 | HR 93 | Wt 180.0 lb

## 2014-03-03 DIAGNOSIS — IMO0002 Reserved for concepts with insufficient information to code with codable children: Secondary | ICD-10-CM

## 2014-03-03 DIAGNOSIS — M170 Bilateral primary osteoarthritis of knee: Secondary | ICD-10-CM

## 2014-03-03 DIAGNOSIS — M1710 Unilateral primary osteoarthritis, unspecified knee: Secondary | ICD-10-CM

## 2014-03-03 DIAGNOSIS — M5416 Radiculopathy, lumbar region: Secondary | ICD-10-CM

## 2014-03-03 NOTE — Progress Notes (Signed)
CC: Janet Richards is a 53 y.o. female is here for Injections   Subjective: HPI:  Followup bilateral knee pain. After steroid injection last week left knee pain has completely resolved. She is requesting whether or not right knee injection would help as well. She denies any locking catching or giving way of the right knee. It occasionally swells to mild degree but there has been no warmth or redness. It is worse with initial movement after long periods of inactivity. It is localized within the knee described only as pain moderate in severity.  She also complains of right leg pain that begins in the right great toe and radiates to the back of the leg up into the lower lumbar region. Is worse with dorsiflexion when her leg is an extended position. Nothing else seems to make it better or worse. She denies weakness nor sensory disturbances in either lower extremity. Denies midline back pain. Denies saddle paresthesia nor bowel or bladder incontinence  Review Of Systems Outlined In HPI  Past Medical History  Diagnosis Date  . Urticaria   . Essential hypertension, benign   . Primary osteoarthritis of both knees   . Exertional chest pain   . Menstrual migraine     Past Surgical History  Procedure Laterality Date  . Tubal ligation  12/26/1987   Family History  Problem Relation Age of Onset  . Hypertension Mother     History   Social History  . Marital Status: Married    Spouse Name: N/A    Number of Children: N/A  . Years of Education: N/A   Occupational History  . Not on file.   Social History Main Topics  . Smoking status: Former Smoker    Quit date: 02/05/2006  . Smokeless tobacco: Never Used  . Alcohol Use: Yes  . Drug Use: No  . Sexual Activity: Not Currently   Other Topics Concern  . Not on file   Social History Narrative  . No narrative on file     Objective: BP 110/73  Pulse 93  Wt 180 lb (81.647 kg)  General: Alert and Oriented, No Acute Distress HEENT:  Pupils equal, round, reactive to light. Conjunctivae clear.  Moist membranes pharynx unremarkable Lungs: Clear to auscultation bilaterally, no wheezing/ronchi/rales.  Comfortable work of breathing. Good air movement. Cardiac: Regular rate and rhythm. Normal S1/S2.  No murmurs, rubs, nor gallops.   Left knee exam shows full-strength and range of motion. There is no swelling, redness, nor warmth overlying the knee.  No patellar crepitus. No patellar apprehension. No pain with palpation of the inferior patellar pole.  No pain or laxity with valgus nor varus stress. Anterior drawer is negative. McMurray's negative. No popliteal space tenderness or palpable mass. No medial or lateral joint line tenderness to palpation. Mental Status: No depression, anxiety, nor agitation. Skin: Warm and dry.  Assessment & Plan: Janet Richards was seen today for injections.  Diagnoses and associated orders for this visit:  Primary osteoarthritis of both knees  Right lumbar radiculitis    Osteoarthritis: Resolved left knee pain, she will receive a steroid injection in the right knee today for symptomatic therapy. I've encouraged her to follow up with Dr. Darene Lamer. for her right lumbar radiculitis.  25 minutes spent face-to-face during visit today of which at least 50% was counseling or coordinating care regarding: 1. Primary osteoarthritis of both knees   2. Right lumbar radiculitis    Knee Injection Procedure Note  Pre-operative Diagnosis: Right knee osteoarthritis  Post-operative  Diagnosis: normal  Indications: Symptom relief from osteoarthritis  Anesthesia: topical cold spray  Procedure Details   Verbal consent was obtained for the procedure. The joint was prepped with Alcohol. A 22 gauge needle was inserted into the inferior aspect of the joint from a lateral approach. 0 ml of  Bursa  fluid was removed from the joint  2 ml 1% lidocaine and 2 ml of triamcinolone (KENALOG) 40mg /ml was then injected into the joint  through the same needle. The needle was removed and the area cleansed and dressed.  Complications:  None; patient tolerated the procedure well.   Return if symptoms worsen or fail to improve, for Dr. Darene Lamer referral for radicular pain.

## 2014-03-10 ENCOUNTER — Ambulatory Visit: Payer: Medicare Other | Admitting: Sports Medicine

## 2014-03-11 ENCOUNTER — Ambulatory Visit: Payer: Medicare Other | Admitting: Sports Medicine

## 2014-03-13 ENCOUNTER — Encounter: Payer: Self-pay | Admitting: Sports Medicine

## 2014-03-13 ENCOUNTER — Ambulatory Visit (INDEPENDENT_AMBULATORY_CARE_PROVIDER_SITE_OTHER): Payer: Medicare Other

## 2014-03-13 ENCOUNTER — Ambulatory Visit (INDEPENDENT_AMBULATORY_CARE_PROVIDER_SITE_OTHER): Payer: Medicare Other | Admitting: Sports Medicine

## 2014-03-13 VITALS — BP 118/64 | HR 88 | Ht 63.0 in | Wt 176.0 lb

## 2014-03-13 DIAGNOSIS — IMO0002 Reserved for concepts with insufficient information to code with codable children: Secondary | ICD-10-CM

## 2014-03-13 DIAGNOSIS — M545 Low back pain, unspecified: Secondary | ICD-10-CM

## 2014-03-13 DIAGNOSIS — M5416 Radiculopathy, lumbar region: Secondary | ICD-10-CM

## 2014-03-13 MED ORDER — METHYLPREDNISOLONE SODIUM SUCC 125 MG IJ SOLR
250.0000 mg | Freq: Once | INTRAMUSCULAR | Status: AC
  Administered 2014-03-13: 250 mg via INTRAMUSCULAR

## 2014-03-13 NOTE — Assessment & Plan Note (Signed)
Solu-Medrol 250 mg intramuscular. Classic right L5 Formal physical therapy, x-rays. Return to see me in one month, MRI for interventional injection planning if no better.

## 2014-03-13 NOTE — Progress Notes (Signed)
   Subjective:    I'm seeing this patient as a consultation for:  Dr. Ileene Rubens  CC: Right hip pain  HPI: This is a pleasant 53 year old female, I have seen her in the past for right-sided lumbar radiculitis which resolved with conservative measures. She's now having a recurrence in pain and she realizes down the lateral aspect of the right thigh, down to the foot in an L5 distribution. Moderate, persistent, worse with sitting for long periods of time, flexion, and Valsalva.  Past medical history, Surgical history, Family history not pertinant except as noted below, Social history, Allergies, and medications have been entered into the medical record, reviewed, and no changes needed.   Review of Systems: No headache, visual changes, nausea, vomiting, diarrhea, constipation, dizziness, abdominal pain, skin rash, fevers, chills, night sweats, weight loss, swollen lymph nodes, body aches, joint swelling, muscle aches, chest pain, shortness of breath, mood changes, visual or auditory hallucinations.   Objective:   General: Well Developed, well nourished, and in no acute distress.  Neuro/Psych: Alert and oriented x3, extra-ocular muscles intact, able to move all 4 extremities, sensation grossly intact. Skin: Warm and dry, no rashes noted.  Respiratory: Not using accessory muscles, speaking in full sentences, trachea midline.  Cardiovascular: Pulses palpable, no extremity edema. Abdomen: Does not appear distended. Back Exam:  Inspection: Unremarkable  Motion: Flexion 45 deg, Extension 45 deg, Side Bending to 45 deg bilaterally,  Rotation to 45 deg bilaterally  SLR laying: Negative  XSLR laying: Negative  Palpable tenderness: None. FABER: negative. Sensory change: Gross sensation intact to all lumbar and sacral dermatomes.  Reflexes: 2+ at both patellar tendons, 2+ at achilles tendons, Babinski's downgoing.  Strength at foot  Plantar-flexion: 5/5 Dorsi-flexion: 5/5 Eversion: 5/5 Inversion: 5/5    Leg strength  Quad: 5/5 Hamstring: 5/5 Hip flexor: 5/5 Hip abductors: 5/5  Gait unremarkable.   Impression and Recommendations:   This case required medical decision making of moderate complexity.

## 2014-03-15 ENCOUNTER — Encounter: Payer: Self-pay | Admitting: Emergency Medicine

## 2014-03-15 ENCOUNTER — Emergency Department (INDEPENDENT_AMBULATORY_CARE_PROVIDER_SITE_OTHER)
Admission: EM | Admit: 2014-03-15 | Discharge: 2014-03-15 | Disposition: A | Discharge status: Home / Self Care | Payer: Medicare Other | Source: Home / Self Care | Attending: Family Medicine | Admitting: Family Medicine

## 2014-03-15 DIAGNOSIS — K0510 Chronic gingivitis, plaque induced: Secondary | ICD-10-CM

## 2014-03-15 MED ORDER — CHLORHEXIDINE GLUCONATE 0.12 % MT SOLN: 15.0000 mL | Freq: Four times a day (QID) | OROMUCOSAL | Status: DC

## 2014-03-15 MED ORDER — METRONIDAZOLE 500 MG PO TABS: 500.0000 mg | ORAL_TABLET | Freq: Three times a day (TID) | ORAL | Status: DC

## 2014-03-15 NOTE — Discharge Instructions (Signed)
Gingivitis Gingivitis is a form of gum (periodontal) disease that causes redness, soreness, and swelling (inflammation) of your gums. CAUSES The most common cause of gingivitis is poor oral hygiene. A sticky substance made of bacteria, mucus, and food particles (plaque), is deposited on the exposed part of teeth. As plaque builds up, it reacts with the saliva in your mouth to form something called  tartar. Tartar is a hard deposit that becomes trapped around the base of the tooth. Plaque and tartar irritate the gums, leading to the formation of gingivitis. Other factors that increase your risk for gingivitis include:   Tobacco use.  Diabetes.  Older age.  Certain medications.  Certain viral or fungal infections.  Dry mouth.  Hormonal changes such as during pregnancy.  Poor nutrition.  Substance abuse.  Poor fitting dental restorations or appliances. SYMPTOMS You may notice inflammation of the soft tissue (gingiva) around the teeth. When these tissues become inflamed, they bleed easily, especially during flossing or brushing. The gums may also be:   Tender to the touch.  Bright red, purple red, or have a shiny appearance.  Swollen.  Wearing away from the teeth (receding), which exposes more of the tooth. Bad breath is often present. Continued infection around teeth can eventually cause cavities and loosen teeth. This may lead to eventual tooth loss. DIAGNOSIS A medical and dental history will be taken. Your mouth, teeth, and gums will be examined. Your dentist will look for soft, swollen purple-red, irritated gums. There may be deposits of plaque and tartar at the base of the teeth. Your gums will be looked at for the degree of redness, puffiness, and bleeding tendencies. Your dentist will see if any of the teeth are loose. X-rays may be taken to see if the inflammation has spread to the supporting structures of the teeth. TREATMENT The goal is to reduce and reverse the  inflammation. Proper treatment can usually reverse the symptoms of gingivitis and prevent further progression of the disease. Have your teeth cleaned. During the cleaning, all plaque and tartar will be removed. Instruction for proper home care will be given. You will need regular professional cleanings and check-ups in the future. HOME CARE INSTRUCTIONS  Brush your teeth twice a day and floss at least once per day. When flossing, it is best to floss first then brush.  Limit sugar between meals and maintain a well-balanced diet.  Even the best dental hygiene will not prevent plaque from developing. It is necessary for you to see your dentist on a regular basis for cleaning and regular checkups.  Your dentist can recommend proper oral hygiene and mouth care and suggest special toothpastes or mouth rinses.  Stop smoking. SEEK DENTAL OR MEDICAL CARE IF:  You have painful, reddened tissue around your teeth, or you have puffy swollen gums.  You have difficulty chewing.  You notice any loose or infected teeth.  You have swollen glands.  Your gums bleed easily when you brush your teeth or are very tender to the touch. Document Released: 04/19/2001 Document Revised: 01/16/2012 Document Reviewed: 01/28/2011 [facility] Patient Information 2014 [place].   Periodontal Disease Periodontal disease, or gum disease, is a type of oral disease that affects the surrounding and supporting tissues of the teeth. These include the gums (gingivae), ligaments, and tooth socket (alveolar bone). Periodontal disease can affect one tooth or many teeth. If left untreated, it may lead to tooth loss.  CAUSES The main cause of periodontal disease is dental plaque, which contains harmful  bacteria. These bacteria can cause the gums to become inflamed and infected. Further progression of the disease can damage the other supporting tissues.  RISK FACTORS  Diabetes.   Smoking and tobacco use.   Genetics.    Hormonal changes of puberty, menopause, and pregnancy.   Stress.   Clenching or grinding your teeth.   Substance abuse.  Poor nutrition.   Diseases that interfere with the body's immune system.   Certain medicines. SIGNS AND SYMPTOMS  Red or swollen gums.  Bad breath that does not go away.  Gums that have pulled away from the teeth.  Gums that bleed easily.  Permanent teeth that are loose or separating.  Pain when chewing.  Changes in the way your teeth fit together.  Sensitive teeth. DIAGNOSIS  A thorough examination of the periodontal tissues will be done by your dentist. X-rays may be needed. Evaluation of your medical history will be needed to see if there are other factors or underlying conditions that may contribute to the disease. TREATMENT The number and types of treatment will vary depending on the extent of the disease. Treatment may include brushing and flossing only. Further disease progression may necessitate scaling and root planing or even surgery. The main goal is to control the infection. Good oral hygiene at home is necessary for the success of all types of treatment. HOME CARE INSTRUCTIONS   Practice good oral hygiene. This includes flossing and brushing your teeth every day.   See your dentist regularly, at least 2 times per year.   Stop smoking if you smoke.  Eat a well-balanced diet. SEEK IMMEDIATE DENTAL CARE IF:   You have any signs or symptoms of periodontal disease along with:  Swelling of your face, neck, or jaw.  Inability to open your mouth.  Severe pain uncontrolled by pain medicine.  You have a fever or persistent symptoms for more than 2 3 days.  You have a fever and your symptoms suddenly get worse. Document Released: 10/27/2003 Document Revised: 06/26/2013 Document Reviewed: 04/02/2013 [facility] Patient Information 2014 [place].

## 2014-03-15 NOTE — ED Notes (Signed)
Reports long term dental problems x 2 years; has appointment with DDS on May 12th; needs refill of antibiotics to last until then.

## 2014-03-15 NOTE — ED Provider Notes (Addendum)
CSN: [id number]     Arrival date & time 03/15/14  1018 History   First MD Initiated Contact with Patient 03/15/14 1054     Chief Complaint  Patient presents with  . Dental Pain      HPI Comments: Patient has a long history of dental caries and gingivitis.  Recently has had increased bilateral gingival pain.  No bleeding.  She has a dental appt scheduled in 3 days.                                                                                                                                                                               Patient is a 53 y.o. female presenting with tooth pain. The history is provided by the patient.  Dental Pain Location:  Generalized Quality:  Aching Severity:  Mild Onset quality:  Gradual Duration:  2 years Timing:  Constant Progression:  Worsening Chronicity:  Chronic Context: dental caries, dental fracture and poor dentition   Relieved by:  Nothing Worsened by:  Touching Ineffective treatments:  Acetaminophen Associated symptoms: gum swelling and headaches   Associated symptoms: no congestion, no difficulty swallowing, no drooling, no facial pain, no facial swelling, no fever, no neck pain, no neck swelling, no oral bleeding and no oral lesions     Past Medical History  Diagnosis Date  . Urticaria   . Essential hypertension, benign   . Primary osteoarthritis of both knees   . Exertional chest pain   . Menstrual migraine    Past Surgical History  Procedure Laterality Date  . Tubal ligation  12/26/1987   Family History  Problem Relation Age of Onset  . Hypertension Mother    History  Substance Use Topics  . Smoking status: Former Smoker    Quit date: 02/05/2006  . Smokeless tobacco: Never Used  . Alcohol Use: Yes   OB History   Grav Para Term Preterm Abortions TAB SAB Ect Mult Living                 Review of Systems  Constitutional: Negative for fever.  HENT: Negative for congestion, drooling, facial swelling and mouth sores.     Musculoskeletal: Negative for neck pain.  Neurological: Positive for headaches.  All other systems reviewed and are negative.   Allergies  Aspirin; Clindamycin/lincomycin; Cortisporin; Erythromycin; Metronidazole; Morphine and related; Nsaids; and Penicillins  Home Medications   Prior to Admission medications   Medication Sig Start Date End Date Taking? Authorizing Provider  albuterol (PROVENTIL HFA;VENTOLIN HFA) 108 (90 BASE) MCG/ACT inhaler Inhale into the lungs every 6 (six) hours as needed for wheezing or shortness of breath.   Yes Historical Provider, MD  albuterol (PROVENTIL HFA;VENTOLIN HFA)  108 (90 BASE) MCG/ACT inhaler Inhale 2 puffs into the lungs every 6 (six) hours as needed for wheezing.    Historical Provider, MD  Beclomethasone Dipropionate (QNASL) 80 MCG/ACT AERS Two sprays each nostril daily. 01/22/14   Marcial Pacas, DO  carisoprodol (SOMA) 350 MG tablet Take 1 tablet (350 mg total) by mouth 3 (three) times daily as needed (back pain). 08/08/13   Sean Hommel, DO  chlorhexidine (PERIDEX) 0.12 % solution Use as directed 15 mLs in the mouth or throat 4 (four) times daily. 03/15/14   Kandra Nicolas, MD  cyclobenzaprine (FLEXERIL) 10 MG tablet Take a half to a full tab every 8-12 hours only as needed for muscle spasm, may cause sedation. 08/08/13   Sean Hommel, DO  Fexofenadine HCl (ALLEGRA PO) Take by mouth.    Historical Provider, MD  fluticasone (FLONASE) 50 MCG/ACT nasal spray Place 2 sprays into the nose daily.    Historical Provider, MD  hydrochlorothiazide (HYDRODIURIL) 25 MG tablet TAKE 1 TABLET BY MOUTH DAILY 12/06/13   Marcial Pacas, DO  hydrOXYzine (ATARAX/VISTARIL) 10 MG tablet Take 10 mg by mouth 3 (three) times daily as needed.    Historical Provider, MD  loratadine (CLARITIN) 10 MG tablet Take 10 mg by mouth daily.    Historical Provider, MD  methocarbamol (ROBAXIN) 500 MG tablet Take 1 tablet (500 mg total) by mouth every 8 (eight) hours as needed for muscle spasms.  11/22/13   Sean Hommel, DO  metroNIDAZOLE (FLAGYL) 500 MG tablet Take 1 tablet (500 mg total) by mouth 3 (three) times daily. 03/15/14   Kandra Nicolas, MD  montelukast (SINGULAIR) 10 MG tablet  04/04/13   Historical Provider, MD  Multiple Vitamins-Minerals (MULTIVITAMIN PO) Take by mouth.    Historical Provider, MD  omeprazole (PRILOSEC) 20 MG capsule TAKE ONE CAPSULE BY MOUTH DAILY 02/06/14   Marcial Pacas, DO  propranolol (INDERAL) 40 MG tablet  03/19/13   Historical Provider, MD  traMADol (ULTRAM) 50 MG tablet Take 1 tablet (50 mg total) by mouth every 8 (eight) hours as needed for pain. 04/19/13   Silverio Decamp, MD   BP 111/73  Pulse 88  Temp(Src) 98.6 F (37 C) (Oral)  Resp 16  Ht 5\' 3"  (1.6 m)  Wt 176 lb (79.833 kg)  BMI 31.18 kg/m2  SpO2 99% Physical Exam  Nursing note and vitals reviewed. Constitutional: She appears well-developed and well-nourished. No distress.  Patient is obese (BMI 31.2)  HENT:  Head: Normocephalic.  Right Ear: Tympanic membrane normal.  Left Ear: Tympanic membrane normal.  Nose: Nose normal.  Mouth/Throat: Oropharynx is clear and moist. No oral lesions. No trismus in the jaw. Dental caries present.    Bilateral mandibular teeth as noted on diagram tender to palpation. Gingiva diffusely tender to palpation   Eyes: Conjunctivae are normal. Pupils are equal, round, and reactive to light.  Neck: Neck supple.  Lymphadenopathy:    She has no cervical adenopathy.    ED Course  Procedures  none          MDM   1. Gingivitis, chronic    Begin Flagyl 500mg  TID (patient reports that she is able to take without rash or other adverse effect). Begin chlorhexidine mouthwash QID. Followup with dentist in 3 days as scheduled.    Kandra Nicolas, MD 03/15/14 8295  Kandra Nicolas, MD 05/31/15 [phone]

## 2014-03-20 ENCOUNTER — Ambulatory Visit (INDEPENDENT_AMBULATORY_CARE_PROVIDER_SITE_OTHER): Payer: Medicare Other | Admitting: Family Medicine

## 2014-03-20 ENCOUNTER — Encounter: Payer: Self-pay | Admitting: Family Medicine

## 2014-03-20 VITALS — BP 108/68 | HR 93 | Temp 98.2°F | Wt 178.0 lb

## 2014-03-20 DIAGNOSIS — J309 Allergic rhinitis, unspecified: Secondary | ICD-10-CM

## 2014-03-20 DIAGNOSIS — J029 Acute pharyngitis, unspecified: Secondary | ICD-10-CM

## 2014-03-20 DIAGNOSIS — K047 Periapical abscess without sinus: Secondary | ICD-10-CM | POA: Insufficient documentation

## 2014-03-20 DIAGNOSIS — K044 Acute apical periodontitis of pulpal origin: Secondary | ICD-10-CM

## 2014-03-20 MED ORDER — METRONIDAZOLE 500 MG PO TABS: 500.0000 mg | ORAL_TABLET | Freq: Three times a day (TID) | ORAL | Status: DC

## 2014-03-20 MED ORDER — BECLOMETHASONE DIPROPIONATE 80 MCG/ACT NA AERS: INHALATION_SPRAY | NASAL | Status: DC

## 2014-03-20 MED ORDER — LIDOCAINE VISCOUS 2 % MT SOLN
20.0000 mL | OROMUCOSAL | Status: DC | PRN
Start: 2014-03-20 — End: 2014-11-26

## 2014-03-20 NOTE — Addendum Note (Signed)
Addended by: Marcial Pacas on: 03/20/2014 09:49 AM   Modules accepted: Orders

## 2014-03-20 NOTE — Progress Notes (Signed)
CC: Janet Richards is a 53 y.o. female is here for Sore Throat   Subjective: HPI:  Sore throat for the past 24 hours mild in severity worse with swallowing nonradiating. Described only a soreness and pain. No interventions as of yet other than starting Singulair this morning without much benefit as of yet. Present all hours of the day but not interfering with sleep. Denies difficulty swallowing, fevers, chills, cough, shortness of breath. Review of systems is positive for increased clear nasal discharge.  She has not been using any nasal steroid because she believes that a new prescription as needed. Prior authorization has already been approved  Complains of continued dental pain in the right lower posterior molar and left posterior upper molar. She missed her dentist appointment that was scheduled for a few weeks ago. She has rescheduled for a [facility] clinic. She reports severe tenderness to touch of both teeth.     Review Of Systems Outlined In HPI  Past Medical History  Diagnosis Date  . Urticaria   . Essential hypertension, benign   . Primary osteoarthritis of both knees   . Exertional chest pain   . Menstrual migraine     Past Surgical History  Procedure Laterality Date  . Tubal ligation  12/26/1987   Family History  Problem Relation Age of Onset  . Hypertension Mother     History   Social History  . Marital Status: Married    Spouse Name: N/A    Number of Children: N/A  . Years of Education: N/A   Occupational History  . Not on file.   Social History Main Topics  . Smoking status: Former Smoker    Quit date: 02/05/2006  . Smokeless tobacco: Never Used  . Alcohol Use: Yes  . Drug Use: No  . Sexual Activity: Not Currently   Other Topics Concern  . Not on file   Social History Narrative  . No narrative on file     Objective: BP 108/68  Pulse 93  Temp(Src) 98.2 F (36.8 C) (Oral)  Wt 178 lb (80.74 kg)  General: Alert and Oriented, No Acute  Distress HEENT: Pupils equal, round, reactive to light. Conjunctivae clear.  Poor dentition especially in the right posterior inferior molar and left superior posterior molar extremely eroded. Pharynx is unremarkable without erythema nor exudates. Uvula is midline. No palpable lymphadenopathy in the neck nor palpable thyromegaly Lungs: Clear to auscultation bilaterally, no wheezing/ronchi/rales.  Comfortable work of breathing. Good air movement. Cardiac: Regular rate and Janet. Normal S1/S2.  No murmurs, rubs, nor gallops.   Mental Status: No depression, anxiety, nor agitation. Skin: Warm and dry.  Assessment & Plan: Janet Richards was seen today for sore throat.  Diagnoses and associated orders for this visit:  Allergic rhinitis - Beclomethasone Dipropionate (QNASL) 80 MCG/ACT AERS; Two sprays each nostril daily.  Sore throat - lidocaine (XYLOCAINE) 2 % solution; Use as directed 20 mLs in the mouth or throat as needed for mouth pain.  Dental infection  Other Orders - metroNIDAZOLE (FLAGYL) 500 MG tablet; Take 1 tablet (500 mg total) by mouth 3 (three) times daily.    Discussed that her sore throat is most likely due to allergic rhinitis therefore start former regimen of qnasl, prescription should be available however refills were provided. Using lidocaine as needed for tooth and throat pain Dental infection: Continuing Flagyl pending removal of both of her molars.  25 minutes spent face-to-face during visit today of which at least 50% was counseling or  coordinating care regarding: 1. Allergic rhinitis   2. Sore throat   3. Dental infection      Return if symptoms worsen or fail to improve.

## 2014-03-25 ENCOUNTER — Telehealth: Payer: Self-pay | Admitting: *Deleted

## 2014-03-25 ENCOUNTER — Encounter: Payer: Self-pay | Admitting: Family Medicine

## 2014-03-25 ENCOUNTER — Ambulatory Visit (INDEPENDENT_AMBULATORY_CARE_PROVIDER_SITE_OTHER): Payer: Medicare Other | Admitting: Family Medicine

## 2014-03-25 VITALS — BP 131/84 | HR 87 | Wt 181.0 lb

## 2014-03-25 DIAGNOSIS — N644 Mastodynia: Secondary | ICD-10-CM

## 2014-03-25 NOTE — Progress Notes (Signed)
CC: Janet Richards is a 53 y.o. female is here for Breast Pain   Subjective: HPI:  Bilateral breast pain that has been present for the past month off and on, on a daily basis. Symptoms are never completely alleviated. They fluctuate from mild to moderate in severity. It equally effects both breasts, diffuse, but concentrated to some degree at the nipples. Worse with light touch. Had identical symptoms years ago which resolved after no particular intervention. She denies breast architectural changes, nipple discharge, no overlying skin changes. She rarely drinks caffeine, no hormonal supplementation, no recent trauma to either breast.  Symptoms only present during waking hours.  Over the past year her period has been much more unpredictable both in frequency and duration of bleeding, bleeding can last anywhere between 2-3 days.  She denies fevers, chills, rashes, swollen lymph nodes. Most recent mammogram September 2014 unremarkable.   Review Of Systems Outlined In HPI  Past Medical History  Diagnosis Date  . Urticaria   . Essential hypertension, benign   . Primary osteoarthritis of both knees   . Exertional chest pain   . Menstrual migraine     Past Surgical History  Procedure Laterality Date  . Tubal ligation  12/26/1987   Family History  Problem Relation Age of Onset  . Hypertension Mother     History   Social History  . Marital Status: Married    Spouse Name: N/A    Number of Children: N/A  . Years of Education: N/A   Occupational History  . Not on file.   Social History Main Topics  . Smoking status: Former Smoker    Quit date: 02/05/2006  . Smokeless tobacco: Never Used  . Alcohol Use: Yes  . Drug Use: No  . Sexual Activity: Not Currently   Other Topics Concern  . Not on file   Social History Narrative  . No narrative on file     Objective: BP 131/84  Pulse 87  Wt 181 lb (82.101 kg)  General: Alert and Oriented, No Acute Distress HEENT: Pupils equal,  round, reactive to light. Conjunctivae clear.  Moist membranes there is unremarkable Lungs: Clear to auscultation bilaterally, no wheezing/ronchi/rales.  Comfortable work of breathing. Good air movement. Cardiac: Regular rate and rhythm. Normal S1/S2.  No murmurs, rubs, nor gallops.   Breast: With Janet Richards present as a chaperone both breasts were palpated without any palpable nodules, architectural abnormalities, nor nipple discharge. Extremities: No peripheral edema.  Strong peripheral pulses.  Mental Status: No depression, anxiety, nor agitation. Skin: Warm and dry.  Assessment & Plan: Janet Richards was seen today for breast pain.  Diagnoses and associated orders for this visit:  Breast pain - FSH - TSH - Prolactin    Breast pain: Suspect this is due to estrogen and or progesterone fluctuations, ruling out thyroid abnormalities or elevated prolactin. If the above results are unremarkable she is open to the idea of starting oral contraceptive medications in hopes of regulating estrogen and progesterone.  25 minutes spent face-to-face during visit today of which at least 50% was counseling or coordinating care regarding: 1. Breast pain      Return if symptoms worsen or fail to improve.

## 2014-03-25 NOTE — Telephone Encounter (Signed)
Pt left a message on my vm at 3:12 in the am stating her breast were hurting and she needs an xray.called pt back and left her a message that she should schedule an appt with [facility] and if he needed to an u/s would be ordered

## 2014-03-27 ENCOUNTER — Ambulatory Visit (INDEPENDENT_AMBULATORY_CARE_PROVIDER_SITE_OTHER): Payer: Medicare Other | Admitting: Physical Therapy

## 2014-03-27 DIAGNOSIS — M6281 Muscle weakness (generalized): Secondary | ICD-10-CM

## 2014-03-27 DIAGNOSIS — IMO0002 Reserved for concepts with insufficient information to code with codable children: Secondary | ICD-10-CM

## 2014-03-27 DIAGNOSIS — M2550 Pain in unspecified joint: Secondary | ICD-10-CM

## 2014-04-09 ENCOUNTER — Encounter: Payer: Medicare Other | Admitting: Physical Therapy

## 2014-04-09 NOTE — Telephone Encounter (Signed)
error 

## 2014-04-10 ENCOUNTER — Ambulatory Visit: Payer: Medicare Other | Admitting: Sports Medicine

## 2014-04-11 ENCOUNTER — Ambulatory Visit (INDEPENDENT_AMBULATORY_CARE_PROVIDER_SITE_OTHER): Payer: Medicare Other | Admitting: Sports Medicine

## 2014-04-11 ENCOUNTER — Encounter: Payer: Self-pay | Admitting: Sports Medicine

## 2014-04-11 ENCOUNTER — Ambulatory Visit (INDEPENDENT_AMBULATORY_CARE_PROVIDER_SITE_OTHER): Payer: Medicare Other

## 2014-04-11 VITALS — BP 121/88 | HR 88 | Wt 179.0 lb

## 2014-04-11 DIAGNOSIS — M19079 Primary osteoarthritis, unspecified ankle and foot: Secondary | ICD-10-CM

## 2014-04-11 DIAGNOSIS — M898X9 Other specified disorders of bone, unspecified site: Secondary | ICD-10-CM

## 2014-04-11 DIAGNOSIS — IMO0002 Reserved for concepts with insufficient information to code with codable children: Secondary | ICD-10-CM

## 2014-04-11 DIAGNOSIS — M5416 Radiculopathy, lumbar region: Secondary | ICD-10-CM

## 2014-04-11 DIAGNOSIS — M170 Bilateral primary osteoarthritis of knee: Secondary | ICD-10-CM

## 2014-04-11 DIAGNOSIS — M948X9 Other specified disorders of cartilage, unspecified sites: Secondary | ICD-10-CM

## 2014-04-11 HISTORY — DX: Primary osteoarthritis, unspecified ankle and foot: M19.079

## 2014-04-11 NOTE — Assessment & Plan Note (Signed)
Right first MTP injection. Return in 4 weeks. X-rays.

## 2014-04-11 NOTE — Assessment & Plan Note (Signed)
Resolved with physical therapy

## 2014-04-11 NOTE — Progress Notes (Signed)
  Subjective:    CC: Followup  HPI: Right lumbar radiculitis: Resolved with physical therapy.  Right foot pain: Localized at the first metatarsophalangeal joint, pain began gradually, moderate, persistent.  Past medical history, Surgical history, Family history not pertinant except as noted below, Social history, Allergies, and medications have been entered into the medical record, reviewed, and no changes needed.   Review of Systems: No fevers, chills, night sweats, weight loss, chest pain, or shortness of breath.   Objective:    General: Well Developed, well nourished, and in no acute distress.  Neuro: Alert and oriented x3, extra-ocular muscles intact, sensation grossly intact.  HEENT: Normocephalic, atraumatic, pupils equal round reactive to light, neck supple, no masses, no lymphadenopathy, thyroid nonpalpable.  Skin: Warm and dry, no rashes. Cardiac: Regular rate and rhythm, no murmurs rubs or gallops, no lower extremity edema.  Respiratory: Clear to auscultation bilaterally. Not using accessory muscles, speaking in full sentences. Right Foot: No visible erythema or swelling. Range of motion is full in all directions. Strength is 5/5 in all directions. No hallux valgus. No pes cavus or pes planus. No abnormal callus noted. No pain over the navicular prominence, or base of fifth metatarsal. No tenderness to palpation of the calcaneal insertion of plantar fascia. No pain at the Achilles insertion. No pain over the calcaneal bursa. No pain of the retrocalcaneal bursa. No tenderness to palpation over the tarsals, metatarsals, or phalanges. No hallux rigidus or limitus. Tender to palpation over the first metatarsophalangeal joint. No pain with compression of the metatarsal heads. Neurovascularly intact distally.  Procedure: Real-time Ultrasound Guided Injection of right first MTP Device: GE Logiq E  Verbal informed consent obtained.  Time-out conducted.  Noted no overlying  erythema, induration, or other signs of local infection.  Skin prepped in a sterile fashion.  Local anesthesia: Topical Ethyl chloride.  With sterile technique and under real time ultrasound guidance:  0.5 cc Kenalog 40, 0.5 cc lidocaine injected easily. Completed without difficulty  Pain immediately resolved suggesting accurate placement of the medication.  Advised to call if fevers/chills, erythema, induration, drainage, or persistent bleeding.  Images permanently stored and available for review in the ultrasound unit.  Impression: Technically successful ultrasound guided injection.  Impression and Recommendations:

## 2014-04-18 ENCOUNTER — Ambulatory Visit (INDEPENDENT_AMBULATORY_CARE_PROVIDER_SITE_OTHER): Payer: Medicare Other | Admitting: Family Medicine

## 2014-04-18 ENCOUNTER — Encounter: Payer: Self-pay | Admitting: Family Medicine

## 2014-04-18 VITALS — BP 122/69 | HR 86 | Wt 182.0 lb

## 2014-04-18 DIAGNOSIS — N926 Irregular menstruation, unspecified: Secondary | ICD-10-CM

## 2014-04-18 DIAGNOSIS — N939 Abnormal uterine and vaginal bleeding, unspecified: Secondary | ICD-10-CM

## 2014-04-18 DIAGNOSIS — K089 Disorder of teeth and supporting structures, unspecified: Secondary | ICD-10-CM

## 2014-04-18 NOTE — Progress Notes (Signed)
CC: Janet Richards is a 53 y.o. female is here for blood when wiping   Subjective: HPI:  Patient complains of daily vaginal bleeding but has been present for the past 3 weeks, mild in severity, present only when wiping her vagina. Not to the degree where she uses tampons or pads. Occurs throughout the day nothing particularly makes better or worse. She denies any recent pelvic trauma or vaginal pain. States that there is a odor to it but she does not think there is any other vaginal discharge.  She reports abdominal bloating but denies pelvic pain or abdominal pain otherwise. She denies bleeding or easy bruisability elsewhere on the body. Periods have become much more unpredictable over the past 6 months. She's currently not on any hormones , she has yet to have TSH, prolactin, FSH checked since her last visit.   She continues to have questions regarding her tooth decay. She was seen by a local dentist recently however she was dissatisfied with the fact that they would not be removing her teeth on the initial visit. She has decided to seek new dentist. States that the pain is persistent however not bad enough for any medications at this point in her mind. She denies fevers, chills, dysphagia, throat pain, nor swollen lymph nodes.   Review Of Systems Outlined In HPI  Past Medical History  Diagnosis Date  . Urticaria   . Essential hypertension, benign   . Primary osteoarthritis of both knees   . Exertional chest pain   . Menstrual migraine     Past Surgical History  Procedure Laterality Date  . Tubal ligation  12/26/1987   Family History  Problem Relation Age of Onset  . Hypertension Mother     History   Social History  . Marital Status: Married    Spouse Name: N/A    Number of Children: N/A  . Years of Education: N/A   Occupational History  . Not on file.   Social History Main Topics  . Smoking status: Former Smoker    Quit date: 02/05/2006  . Smokeless tobacco: Never Used   . Alcohol Use: Yes  . Drug Use: No  . Sexual Activity: Not Currently   Other Topics Concern  . Not on file   Social History Narrative  . No narrative on file     Objective: BP 122/69  Pulse 86  Wt 182 lb (82.555 kg)  General: Alert and Oriented, No Acute Distress HEENT: Pupils equal, round, reactive to light. Conjunctivae clear.  Moist mucous membranes pharynx unremarkable. Very poor dentition on the right inferior posterior molar and left superior posterior molars. Cardiac: Regular rate and rhythm. Normal S1/S2.  No murmurs, rubs, nor gallops.   Abdomen:  Obese soft nontender Extremities: No peripheral edema.  Strong peripheral pulses.  Mental Status: No depression, anxiety, nor agitation. Skin: Warm and dry.  Assessment & Plan: Janet Richards was seen today for blood when wiping.  Diagnoses and associated orders for this visit:  Abnormal uterine bleeding - US Transvaginal Non-OB; Future - WET PREP FOR TRICH, YEAST, CLUE  Poor dentition    Abnormal uterine bleeding: Checking wet prep per her report of odor, obtaining vaginal ultrasound with TSH, prolactin, FSH.  Anticipating referral to OB/GYN prior to any consideration of hormonal therapy   poor dentition: I urged her to establish with a dentist and informed her that it is unlikely any clinic will remove her teeth at her first visit. Discussed that she may be prolonging the  removal of her teeth by waiting to find a clinic that we'll address all of her concerns on the first visit.  25 minutes spent face-to-face during visit today of which at least 50% was counseling or coordinating care regarding: 1. Abnormal uterine bleeding   2. Poor dentition       Return if symptoms worsen or fail to improve.

## 2014-04-19 LAB — TSH: TSH: 3.215 u[IU]/mL (ref 0.350–4.500)

## 2014-04-19 LAB — FOLLICLE STIMULATING HORMONE: FSH: 3.8 m[IU]/mL

## 2014-04-19 LAB — PROLACTIN: Prolactin: 13.4 ng/mL

## 2014-04-21 LAB — WET PREP, GENITAL
Clue Cells Wet Prep HPF POC: NONE SEEN
Trich, Wet Prep: NONE SEEN
WBC, Wet Prep HPF POC: NONE SEEN
Yeast Wet Prep HPF POC: NONE SEEN

## 2014-04-22 ENCOUNTER — Telehealth: Payer: Self-pay | Admitting: *Deleted

## 2014-04-22 MED ORDER — METRONIDAZOLE 500 MG PO TABS: 500.0000 mg | ORAL_TABLET | Freq: Three times a day (TID) | ORAL | Status: DC

## 2014-04-22 NOTE — Telephone Encounter (Signed)
Pt called and left a message that she wants an abx because her mouth is hurting and she has several teeth that need to come out. Ok to fill metronidazole per [employer]

## 2014-04-22 NOTE — Telephone Encounter (Signed)
closed

## 2014-04-23 ENCOUNTER — Other Ambulatory Visit: Payer: Self-pay | Admitting: Family Medicine

## 2014-04-23 DIAGNOSIS — N939 Abnormal uterine and vaginal bleeding, unspecified: Secondary | ICD-10-CM

## 2014-04-25 ENCOUNTER — Ambulatory Visit (HOSPITAL_BASED_OUTPATIENT_CLINIC_OR_DEPARTMENT_OTHER): Payer: Medicare Other

## 2014-04-26 ENCOUNTER — Encounter (HOSPITAL_COMMUNITY): Payer: Self-pay | Admitting: *Deleted

## 2014-04-26 ENCOUNTER — Inpatient Hospital Stay (HOSPITAL_COMMUNITY)
Admission: AD | Admit: 2014-04-26 | Discharge: 2014-04-26 | Disposition: A | Discharge status: Home / Self Care | Payer: Medicare Other | Source: Ambulatory Visit | Attending: Obstetrics and Gynecology | Admitting: Obstetrics and Gynecology

## 2014-04-26 DIAGNOSIS — Z87891 Personal history of nicotine dependence: Secondary | ICD-10-CM | POA: Insufficient documentation

## 2014-04-26 DIAGNOSIS — N949 Unspecified condition associated with female genital organs and menstrual cycle: Secondary | ICD-10-CM

## 2014-04-26 DIAGNOSIS — N926 Irregular menstruation, unspecified: Secondary | ICD-10-CM | POA: Insufficient documentation

## 2014-04-26 DIAGNOSIS — N938 Other specified abnormal uterine and vaginal bleeding: Secondary | ICD-10-CM | POA: Insufficient documentation

## 2014-04-26 DIAGNOSIS — N925 Other specified irregular menstruation: Secondary | ICD-10-CM

## 2014-04-26 DIAGNOSIS — K044 Acute apical periodontitis of pulpal origin: Secondary | ICD-10-CM | POA: Insufficient documentation

## 2014-04-26 DIAGNOSIS — I10 Essential (primary) hypertension: Secondary | ICD-10-CM | POA: Insufficient documentation

## 2014-04-26 LAB — URINE MICROSCOPIC-ADD ON

## 2014-04-26 LAB — URINALYSIS, ROUTINE W REFLEX MICROSCOPIC
Bilirubin Urine: NEGATIVE
Glucose, UA: NEGATIVE mg/dL
Ketones, ur: NEGATIVE mg/dL
Nitrite: POSITIVE — AB
Protein, ur: 100 mg/dL — AB
Specific Gravity, Urine: 1.015 (ref 1.005–1.030)
Urobilinogen, UA: 0.2 mg/dL (ref 0.0–1.0)
pH: 5 (ref 5.0–8.0)

## 2014-04-26 LAB — CBC
HCT: 37.8 % (ref 36.0–46.0)
Hemoglobin: 12.7 g/dL (ref 12.0–15.0)
MCH: 29.7 pg (ref 26.0–34.0)
MCHC: 33.6 g/dL (ref 30.0–36.0)
MCV: 88.5 fL (ref 78.0–100.0)
Platelets: 381 10*3/uL (ref 150–400)
RBC: 4.27 MIL/uL (ref 3.87–5.11)
RDW: 16.7 % — ABNORMAL HIGH (ref 11.5–15.5)
WBC: 7 10*3/uL (ref 4.0–10.5)

## 2014-04-26 MED ORDER — MEGESTROL ACETATE 40 MG PO TABS: 40.0000 mg | ORAL_TABLET | Freq: Two times a day (BID) | ORAL | Status: DC

## 2014-04-26 NOTE — MAU Note (Signed)
Patient states she is currently on antibiotics for a dental infection. Has some nausea from medication or the infection. States she has had vaginal bleeding for over one month. Has had irregular cycles for years. Has an appointment on Monday at the [facility]. Has some cramping.

## 2014-04-26 NOTE — MAU Provider Note (Signed)
History     CSN: [id number]  Arrival date and time: 04/26/14 1331   None     Chief Complaint  Patient presents with  . Vaginal Bleeding   HPI This is a 53 y.o. who presents with c/o vaginal bleeding for a month. Has had irregular cycles for several years. Has an appointment to be evaluated on Monday.   RN Note: Patient states she is currently on antibiotics for a dental infection. Has some nausea from medication or the infection. States she has had vaginal bleeding for over one month. Has had irregular cycles for years. Has an appointment on Monday at the [facility]. Has some cramping  OB History   Grav Para Term Preterm Abortions TAB SAB Ect Mult Living   5 3 3       3       Past Medical History  Diagnosis Date  . Urticaria   . Essential hypertension, benign   . Primary osteoarthritis of both knees   . Exertional chest pain   . Menstrual migraine     Past Surgical History  Procedure Laterality Date  . Tubal ligation  12/26/1987    Family History  Problem Relation Age of Onset  . Hypertension Mother     History  Substance Use Topics  . Smoking status: Former Smoker    Quit date: 02/05/2006  . Smokeless tobacco: Never Used  . Alcohol Use: Yes    Allergies:  Allergies  Allergen Reactions  . Aspirin   . Clindamycin/Lincomycin     Facial swelling  . Cortisporin [Bacitra-Neomycin-Polymyxin-Hc]   . Erythromycin Nausea And Vomiting    Has taken azithromycin with no issues in the past.   . Metronidazole     Rash  . Morphine And Related     Religious reasons  . Nsaids Swelling  . Penicillins Swelling    Prescriptions prior to admission  Medication Sig Dispense Refill  . albuterol (PROVENTIL HFA;VENTOLIN HFA) 108 (90 BASE) MCG/ACT inhaler Inhale 2 puffs into the lungs every 6 (six) hours as needed for wheezing.      [name] albuterol (PROVENTIL HFA;VENTOLIN HFA) 108 (90 BASE) MCG/ACT inhaler Inhale into the lungs every 6 (six) hours as needed for wheezing  or shortness of breath.      . Beclomethasone Dipropionate (QNASL) 80 MCG/ACT AERS Two sprays each nostril daily.  1 Inhaler  3  . carisoprodol (SOMA) 350 MG tablet Take 1 tablet (350 mg total) by mouth 3 (three) times daily as needed (back pain).  30 tablet  0  . chlorhexidine (PERIDEX) 0.12 % solution Use as directed 15 mLs in the mouth or throat 4 (four) times daily.  240 mL  2  . cyclobenzaprine (FLEXERIL) 10 MG tablet Take a half to a full tab every 8-12 hours only as needed for muscle spasm, may cause sedation.  45 tablet  0  . Fexofenadine HCl (ALLEGRA PO) Take by mouth.      . fluticasone (FLONASE) 50 MCG/ACT nasal spray Place 2 sprays into the nose daily.      . hydrochlorothiazide (HYDRODIURIL) 25 MG tablet TAKE 1 TABLET BY MOUTH DAILY  90 tablet  2  . hydrOXYzine (ATARAX/VISTARIL) 10 MG tablet Take 10 mg by mouth 3 (three) times daily as needed.      . lidocaine (XYLOCAINE) 2 % solution Use as directed 20 mLs in the mouth or throat as needed for mouth pain.  100 mL  1  . loratadine (CLARITIN) 10 MG tablet Take  10 mg by mouth daily.      . methocarbamol (ROBAXIN) 500 MG tablet Take 1 tablet (500 mg total) by mouth every 8 (eight) hours as needed for muscle spasms.  60 tablet  1  . metroNIDAZOLE (FLAGYL) 500 MG tablet Take 1 tablet (500 mg total) by mouth 3 (three) times daily.  21 tablet  0  . montelukast (SINGULAIR) 10 MG tablet       . Multiple Vitamins-Minerals (MULTIVITAMIN PO) Take by mouth.      [name] omeprazole (PRILOSEC) 20 MG capsule TAKE ONE CAPSULE BY MOUTH DAILY  90 capsule  1  . propranolol (INDERAL) 40 MG tablet       . traMADol (ULTRAM) 50 MG tablet Take 1 tablet (50 mg total) by mouth every 8 (eight) hours as needed for pain.  50 tablet  2    Review of Systems  Constitutional: Negative for fever, chills and malaise/fatigue.  Gastrointestinal: Positive for abdominal pain (some cramps). Negative for nausea and vomiting.  Genitourinary:       Vaginal bleeding    Neurological: Positive for weakness.   Physical Exam   Blood pressure 115/71, pulse 76, temperature 98.6 F (37 C), resp. rate 16, height 5\' 3"  (1.6 m), weight 79.833 kg (176 lb), SpO2 100.00%.  Physical Exam  Constitutional: She is oriented to person, place, and time. She appears well-developed and well-nourished. No distress.  HENT:  Head: Normocephalic.  Cardiovascular: Normal rate.   Respiratory: Effort normal.  GI: Soft. She exhibits no distension. There is no tenderness. There is no rebound and no guarding.  Genitourinary: Uterus normal. Vaginal discharge (moderate clotted blood) found.  Musculoskeletal: Normal range of motion.  Neurological: She is alert and oriented to person, place, and time.  Skin: Skin is warm and dry.  Psychiatric: She has a normal mood and affect.    MAU Course  Procedures  MDM Results for orders placed during the hospital encounter of 04/26/14 (from the past 72 hour(s))  URINALYSIS, ROUTINE W REFLEX MICROSCOPIC     Status: Abnormal   Collection Time    04/26/14  1:33 PM      Result Value Ref Range   Color, Urine RED (*) YELLOW   Comment: BIOCHEMICALS MAY BE AFFECTED BY COLOR   APPearance CLOUDY (*) CLEAR   Specific Gravity, Urine 1.015  1.005 - 1.030   pH 5.0  5.0 - 8.0   Glucose, UA NEGATIVE  NEGATIVE mg/dL   Hgb urine dipstick LARGE (*) NEGATIVE   Bilirubin Urine NEGATIVE  NEGATIVE   Ketones, ur NEGATIVE  NEGATIVE mg/dL   Protein, ur 100 (*) NEGATIVE mg/dL   Urobilinogen, UA 0.2  0.0 - 1.0 mg/dL   Nitrite POSITIVE (*) NEGATIVE   Leukocytes, UA MODERATE (*) NEGATIVE  URINE MICROSCOPIC-ADD ON     Status: Abnormal   Collection Time    04/26/14  1:33 PM      Result Value Ref Range   Squamous Epithelial / LPF FEW (*) RARE   WBC, UA 7-10  <3 WBC/hpf   RBC / HPF TOO NUMEROUS TO COUNT  <3 RBC/hpf   Bacteria, UA FEW (*) RARE  CBC     Status: Abnormal   Collection Time    04/26/14  2:55 PM      Result Value Ref Range   WBC 7.0  4.0 -  10.5 K/uL   RBC 4.27  3.87 - 5.11 MIL/uL   Hemoglobin 12.7  12.0 - 15.0 g/dL   HCT 37.8  36.0 - 46.0 %   MCV 88.5  78.0 - 100.0 fL   MCH 29.7  26.0 - 34.0 pg   MCHC 33.6  30.0 - 36.0 g/dL   RDW 16.7 (*) 11.5 - 15.5 %   Platelets 381  150 - 400 K/uL   Hemoglobin was the same a year ago  Assessment and Plan  A:  Dysfunctional uterine bleeding       Perimenopausal  P:  Will start on Megace       Will recommend she go to appt on Monday for full evaluation, needs Endometrial Bx  [facility] 04/26/2014, 2:44 PM

## 2014-04-26 NOTE — Discharge Instructions (Signed)
Abnormal Uterine Bleeding Abnormal uterine bleeding can affect women at various stages in life, including teenagers, women in their reproductive years, pregnant women, and women who have reached menopause. Several kinds of uterine bleeding are considered abnormal, including:  Bleeding or spotting between periods.   Bleeding after sexual intercourse.   Bleeding that is heavier or more than normal.   Periods that last longer than usual.  Bleeding after menopause.  Many cases of abnormal uterine bleeding are minor and simple to treat, while others are more serious. Any type of abnormal bleeding should be evaluated by your health care provider. Treatment will depend on the cause of the bleeding. HOME CARE INSTRUCTIONS Monitor your condition for any changes. The following actions may help to alleviate any discomfort you are experiencing:  Avoid the use of tampons and douches as directed by your health care provider.  Change your pads frequently. You should get regular pelvic exams and Pap tests. Keep all follow-up appointments for diagnostic tests as directed by your health care provider.  SEEK MEDICAL CARE IF:   Your bleeding lasts more than 1 week.   You feel dizzy at times.  SEEK IMMEDIATE MEDICAL CARE IF:   You pass out.   You are changing pads every 15 to 30 minutes.   You have abdominal pain.  You have a fever.   You become sweaty or weak.   You are passing large blood clots from the vagina.   You start to feel nauseous and vomit. MAKE SURE YOU:   Understand these instructions.  Will watch your condition.  Will get help right away if you are not doing well or get worse.   Document Released: 10/24/2005 Document Revised: 10/29/2013 Document Reviewed: 05/23/2013 [facility] Patient Information 2015 [place], [place]. This information is not intended to replace advice given to you by your health care provider. Make sure you discuss any questions you have with your  health care provider.  Menopause Menopause is the normal time of life when menstrual periods stop completely. Menopause is complete when you have missed 12 consecutive menstrual periods. It usually occurs between the ages of 84 years and 14 years. Very rarely does a woman develop menopause before the age of 51 years. At menopause, your ovaries stop producing the female hormones estrogen and progesterone. This can cause undesirable symptoms and also affect your health. Sometimes the symptoms may occur 4-5 years before the menopause begins. There is no relationship between menopause and:  Oral contraceptives.  Number of children you had.  Race.  The age your menstrual periods started (menarche). Heavy smokers and very thin women may develop menopause earlier in life. CAUSES  The ovaries stop producing the female hormones estrogen and progesterone.  Other causes include:  Surgery to remove both ovaries.  The ovaries stop functioning for no known reason.  Tumors of the pituitary gland in the brain.  Medical disease that affects the ovaries and hormone production.  Radiation treatment to the abdomen or pelvis.  Chemotherapy that affects the ovaries. SYMPTOMS   Hot flashes.  Night sweats.  Decrease in sex drive.  Vaginal dryness and thinning of the vagina causing painful intercourse.  Dryness of the skin and developing wrinkles.  Headaches.  Tiredness.  Irritability.  Memory problems.  Weight gain.  Bladder infections.  Hair growth of the face and chest.  Infertility. More serious symptoms include:  Loss of bone (osteoporosis) causing breaks (fractures).  Depression.  Hardening and narrowing of the arteries (atherosclerosis) causing heart attacks and  strokes. DIAGNOSIS   When the menstrual periods have stopped for 12 straight months.  Physical exam.  Hormone studies of the blood. TREATMENT  There are many treatment choices and nearly as many questions  about them. The decisions to treat or not to treat menopausal changes is an individual choice made with your health care provider. Your health care provider can discuss the treatments with you. Together, you can decide which treatment will work best for you. Your treatment choices may include:   Hormone therapy (estrogen and progesterone).  Non-hormonal medicines.  Treating the individual symptoms with medicine (for example antidepressants for depression).  Herbal medicines that may help specific symptoms.  Counseling by a psychiatrist or psychologist.  Group therapy.  Lifestyle changes including:  Eating healthy.  Regular exercise.  Limiting caffeine and alcohol.  Stress management and meditation.  No treatment. HOME CARE INSTRUCTIONS   Take the medicine your health care provider gives you as directed.  Get plenty of sleep and rest.  Exercise regularly.  Eat a diet that contains calcium (good for the bones) and soy products (acts like estrogen hormone).  Avoid alcoholic beverages.  Do not smoke.  If you have hot flashes, dress in layers.  Take supplements, calcium, and vitamin D to strengthen bones.  You can use over-the-counter lubricants or moisturizers for vaginal dryness.  Group therapy is sometimes very helpful.  Acupuncture may be helpful in some cases. SEEK MEDICAL CARE IF:   You are not sure you are in menopause.  You are having menopausal symptoms and need advice and treatment.  You are still having menstrual periods after age 26 years.  You have pain with intercourse.  Menopause is complete (no menstrual period for 12 months) and you develop vaginal bleeding.  You need a referral to a specialist (gynecologist, psychiatrist, or psychologist) for treatment. SEEK IMMEDIATE MEDICAL CARE IF:   You have severe depression.  You have excessive vaginal bleeding.  You fell and think you have a broken bone.  You have pain when you urinate.  You  develop leg or chest pain.  You have a fast pounding heart beat (palpitations).  You have severe headaches.  You develop vision problems.  You feel a lump in your breast.  You have abdominal pain or severe indigestion. Document Released: 01/14/2004 Document Revised: 06/26/2013 Document Reviewed: 05/23/2013 [facility] Patient Information 2015 [place], [place]. This information is not intended to replace advice given to you by your health care provider. Make sure you discuss any questions you have with your health care provider.

## 2014-04-28 ENCOUNTER — Telehealth: Payer: Self-pay | Admitting: *Deleted

## 2014-04-28 ENCOUNTER — Ambulatory Visit (HOSPITAL_BASED_OUTPATIENT_CLINIC_OR_DEPARTMENT_OTHER)
Admission: RE | Admit: 2014-04-28 | Discharge: 2014-04-28 | Disposition: A | Discharge status: Home / Self Care | Payer: Medicare Other | Source: Ambulatory Visit | Attending: Family Medicine | Admitting: Family Medicine

## 2014-04-28 DIAGNOSIS — N939 Abnormal uterine and vaginal bleeding, unspecified: Secondary | ICD-10-CM

## 2014-04-28 DIAGNOSIS — N949 Unspecified condition associated with female genital organs and menstrual cycle: Secondary | ICD-10-CM | POA: Insufficient documentation

## 2014-04-28 DIAGNOSIS — N854 Malposition of uterus: Secondary | ICD-10-CM | POA: Insufficient documentation

## 2014-04-28 DIAGNOSIS — N925 Other specified irregular menstruation: Secondary | ICD-10-CM | POA: Insufficient documentation

## 2014-04-28 DIAGNOSIS — N938 Other specified abnormal uterine and vaginal bleeding: Secondary | ICD-10-CM | POA: Insufficient documentation

## 2014-04-28 MED ORDER — PREDNISONE 20 MG PO TABS: ORAL_TABLET | ORAL | Status: AC

## 2014-04-28 NOTE — Telephone Encounter (Signed)
Pt called this morning requesting refill of prednisone. She said that she is currently having an "outbreak" of uticaria and this is the only thing that helps her. Please advise.

## 2014-04-28 NOTE — Telephone Encounter (Signed)
Pt.notified

## 2014-04-28 NOTE — Telephone Encounter (Signed)
Janet Richards, [facility] [address] in [place]

## 2014-04-29 ENCOUNTER — Telehealth: Payer: Self-pay | Admitting: Family Medicine

## 2014-04-29 DIAGNOSIS — N939 Abnormal uterine and vaginal bleeding, unspecified: Secondary | ICD-10-CM

## 2014-04-29 NOTE — Telephone Encounter (Signed)
Janet Richards, Will you please let patient know that her pelvic ultrasound showed the fibroid that we knew was already there but no other abnormalities.  I'm going to recommend that she visit with a GYN specialist to see if she's a candidate for medication to help control her bleeding.  A referral has been placed, contact me if not contacted about this by next week.

## 2014-04-29 NOTE — Telephone Encounter (Signed)
Message left on vm with results  

## 2014-04-29 NOTE — [facility] Provider Note (Signed)
Attestation of Attending Supervision of Advanced Practitioner (CNM/NP): Evaluation and management procedures were performed by the Advanced Practitioner under my supervision and collaboration.  I have reviewed the Advanced Practitioner's note and chart, and I agree with the management and plan.  CONSTANT,PEGGY 04/29/2014 8:02 AM

## 2014-04-30 ENCOUNTER — Ambulatory Visit (INDEPENDENT_AMBULATORY_CARE_PROVIDER_SITE_OTHER): Payer: Medicare Other | Admitting: Family Medicine

## 2014-04-30 ENCOUNTER — Encounter: Payer: Self-pay | Admitting: Family Medicine

## 2014-04-30 VITALS — BP 126/83 | HR 102 | Temp 98.1°F | Wt 177.4 lb

## 2014-04-30 DIAGNOSIS — R220 Localized swelling, mass and lump, head: Secondary | ICD-10-CM

## 2014-04-30 DIAGNOSIS — R221 Localized swelling, mass and lump, neck: Secondary | ICD-10-CM

## 2014-04-30 NOTE — Progress Notes (Signed)
CC: Janet Richards is a 53 y.o. female is here for Follow-up   Subjective: HPI:  Complains of swelling on the underside of her neck that has been present to at least a mild to moderate degree for the past year. It seems to be worse when she is experiencing dental pain. It improves when on antibiotics. Nothing else seems to make better or worse.  It is nontender.  It is present all hours of the day. She believes that there is an infection inside whatever this mass is and is causing dental infections.  She denies fevers, chills, unintentional weight loss, difficult swallowing, change in voice, cough, nor any pulmonary complaints   Review Of Systems Outlined In HPI  Past Medical History  Diagnosis Date  . Urticaria   . Essential hypertension, benign   . Primary osteoarthritis of both knees   . Exertional chest pain   . Menstrual migraine     Past Surgical History  Procedure Laterality Date  . Tubal ligation  12/26/1987   Family History  Problem Relation Age of Onset  . Hypertension Mother     History   Social History  . Marital Status: Married    Spouse Name: N/A    Number of Children: N/A  . Years of Education: N/A   Occupational History  . Not on file.   Social History Main Topics  . Smoking status: Former Smoker    Quit date: 02/05/2006  . Smokeless tobacco: Never Used  . Alcohol Use: Yes  . Drug Use: No  . Sexual Activity: Not Currently   Other Topics Concern  . Not on file   Social History Narrative  . No narrative on file     Objective: BP 126/83  Pulse 102  Temp(Src) 98.1 F (36.7 C) (Oral)  Wt 177 lb 6.4 oz (80.468 kg)  LMP 04/02/2014  General: Alert and Oriented, No Acute Distress HEENT: Pupils equal, round, reactive to light. Conjunctivae clear.  External ears unremarkable, canals clear with intact TMs with appropriate landmarks.  Middle ear appears open without effusion. Pink inferior turbinates.  Moist mucous membranes, pharynx without  inflammation nor lesions.  Poor dentition with numerous cavities on the right lower molars and left upper molars. Neck without palpable lymphadenopathy with a approximate 2 cm diameter nontender mobile mass in the submandibular space localized on the midline. Lungs: Clear to auscultation bilaterally, no wheezing/ronchi/rales.  Comfortable work of breathing. Good air movement. Mental Status: No depression, anxiety, nor agitation. Skin: Warm and dry.  Assessment & Plan: Janet Richards was seen today for follow-up.  Diagnoses and associated orders for this visit:  Neck mass - US Soft Tissue Head/Neck; Future    Neck mass: Reassurance was provided at it's unlikely this mass is a source of infection but is rather most likely a representation of poor dentition and a chronic dental infection. Discussed the differential could include lipoma, lymph node, epidermoid cyst, or even just normal fat distribution with edema in the submandibular space. Ordering ultrasound for further evaluation. 25 minutes spent face-to-face during visit today of which at least 50% was counseling or coordinating care regarding: 1. Neck mass       Return if symptoms worsen or fail to improve.

## 2014-05-01 ENCOUNTER — Telehealth: Payer: Self-pay | Admitting: *Deleted

## 2014-05-01 NOTE — Telephone Encounter (Signed)
Seth Bake, Will you let her know I'm happy to Rx this but this too is a steroid.  She was hoping to avoid steroids.  Astelin would be a non-steroid option if she's still wanting to avoid steroids.

## 2014-05-01 NOTE — Telephone Encounter (Signed)
Pt was using rhinocort AQ 32 mcg

## 2014-05-02 ENCOUNTER — Ambulatory Visit (INDEPENDENT_AMBULATORY_CARE_PROVIDER_SITE_OTHER): Payer: Medicare Other

## 2014-05-02 ENCOUNTER — Other Ambulatory Visit: Payer: Medicare Other

## 2014-05-02 DIAGNOSIS — R220 Localized swelling, mass and lump, head: Secondary | ICD-10-CM

## 2014-05-02 DIAGNOSIS — R221 Localized swelling, mass and lump, neck: Secondary | ICD-10-CM

## 2014-05-02 MED ORDER — AZELASTINE HCL 0.1 % NA SOLN: 1.0000 | Freq: Two times a day (BID) | NASAL | Status: DC

## 2014-05-02 NOTE — Telephone Encounter (Signed)
Yes pt is interested in Astelin. I sent it to the pharm

## 2014-05-07 ENCOUNTER — Telehealth: Payer: Self-pay

## 2014-05-07 NOTE — Telephone Encounter (Signed)
Called patient to schedule appt for abnormal bleeding and patient no longer need [facility]

## 2014-05-12 ENCOUNTER — Encounter: Payer: Self-pay | Admitting: Sports Medicine

## 2014-05-12 ENCOUNTER — Ambulatory Visit (INDEPENDENT_AMBULATORY_CARE_PROVIDER_SITE_OTHER): Payer: Medicare Other | Admitting: Sports Medicine

## 2014-05-12 ENCOUNTER — Telehealth: Payer: Self-pay | Admitting: *Deleted

## 2014-05-12 VITALS — BP 143/88 | HR 94 | Wt 180.0 lb

## 2014-05-12 DIAGNOSIS — IMO0002 Reserved for concepts with insufficient information to code with codable children: Secondary | ICD-10-CM

## 2014-05-12 DIAGNOSIS — M5416 Radiculopathy, lumbar region: Secondary | ICD-10-CM

## 2014-05-12 MED ORDER — CLARITHROMYCIN 250 MG PO TABS: 250.0000 mg | ORAL_TABLET | Freq: Two times a day (BID) | ORAL | Status: DC

## 2014-05-12 NOTE — Progress Notes (Signed)
  Subjective:    CC: Followup  HPI: Lumbar radiculopathy : resolved with conservative measures.  Past medical history, Surgical history, Family history not pertinant except as noted below, Social history, Allergies, and medications have been entered into the medical record, reviewed, and no changes needed.   Review of Systems: No fevers, chills, night sweats, weight loss, chest pain, or shortness of breath.   Objective:    General: Well Developed, well nourished, and in no acute distress.  Neuro: Alert and oriented x3, extra-ocular muscles intact, sensation grossly intact.  HEENT: Normocephalic, atraumatic, pupils equal round reactive to light, neck supple, no masses, no lymphadenopathy, thyroid nonpalpable.  Skin: Warm and dry, no rashes. Cardiac: Regular rate and rhythm, no murmurs rubs or gallops, no lower extremity edema.  Respiratory: Clear to auscultation bilaterally. Not using accessory muscles, speaking in full sentences.  Impression and Recommendations:

## 2014-05-12 NOTE — Telephone Encounter (Signed)
[facility] [address]

## 2014-05-12 NOTE — Assessment & Plan Note (Signed)
Resolve, return as needed.

## 2014-05-12 NOTE — Telephone Encounter (Signed)
Pt is requesting an abx for her tooth

## 2014-05-19 ENCOUNTER — Other Ambulatory Visit: Payer: Self-pay | Admitting: Family Medicine

## 2014-05-28 ENCOUNTER — Telehealth: Payer: Self-pay | Admitting: Family Medicine

## 2014-05-28 DIAGNOSIS — Z1239 Encounter for other screening for malignant neoplasm of breast: Secondary | ICD-10-CM

## 2014-05-28 NOTE — Telephone Encounter (Signed)
Mammo order placed, can you let me know what diagnosis/symptom she's requesting to be linked to the [facility] referral?

## 2014-05-28 NOTE — Telephone Encounter (Signed)
Patient walked in request to get a referral to [facility] and would like to get a Mammogram order as well. Thanks

## 2014-05-29 NOTE — Telephone Encounter (Signed)
Pt notified; she wanted it for med changes and pap; I called the patient and told her that she didn't need another referral. One had been sent back in June and she can call and schedule an appt

## 2014-06-02 ENCOUNTER — Telehealth: Payer: Self-pay | Admitting: *Deleted

## 2014-06-02 MED ORDER — FLUCONAZOLE 150 MG PO TABS: 150.0000 mg | ORAL_TABLET | Freq: Once | ORAL | Status: DC

## 2014-06-02 NOTE — Telephone Encounter (Signed)
Pt called and requested diflucan for because of a yeast infection due to abx that she was rx'ed in our office. Sxs include itching and burning,discharge. Will send in Diflucan and if it doesn't clear up pt will need an appt.ok per hommel

## 2014-06-03 ENCOUNTER — Other Ambulatory Visit: Payer: Self-pay | Admitting: *Deleted

## 2014-06-03 MED ORDER — AZELASTINE HCL 0.1 % NA SOLN: 1.0000 | Freq: Two times a day (BID) | NASAL | Status: DC

## 2014-06-03 NOTE — Telephone Encounter (Signed)
Refill request received through fax[facility]. Margette Fast, CMA

## 2014-06-04 ENCOUNTER — Telehealth: Payer: Self-pay | Admitting: *Deleted

## 2014-06-04 MED ORDER — PREDNISONE 20 MG PO TABS: ORAL_TABLET | ORAL | Status: AC

## 2014-06-04 NOTE — Telephone Encounter (Signed)
Pt is requesting a rx for prednisone for urticaria

## 2014-06-04 NOTE — Telephone Encounter (Signed)
[facility] [place]

## 2014-06-12 ENCOUNTER — Encounter: Payer: Self-pay | Admitting: Obstetrics & Gynecology

## 2014-06-12 ENCOUNTER — Ambulatory Visit (INDEPENDENT_AMBULATORY_CARE_PROVIDER_SITE_OTHER): Payer: Medicare Other | Admitting: Obstetrics & Gynecology

## 2014-06-12 VITALS — BP 124/80 | HR 100 | Resp 16 | Ht 63.0 in | Wt 179.0 lb

## 2014-06-12 DIAGNOSIS — N926 Irregular menstruation, unspecified: Secondary | ICD-10-CM

## 2014-06-12 DIAGNOSIS — N920 Excessive and frequent menstruation with regular cycle: Secondary | ICD-10-CM

## 2014-06-12 DIAGNOSIS — N939 Abnormal uterine and vaginal bleeding, unspecified: Secondary | ICD-10-CM

## 2014-06-12 DIAGNOSIS — N921 Excessive and frequent menstruation with irregular cycle: Secondary | ICD-10-CM

## 2014-06-12 DIAGNOSIS — Z01812 Encounter for preprocedural laboratory examination: Secondary | ICD-10-CM

## 2014-06-12 LAB — POCT URINE PREGNANCY: Preg Test, Ur: NEGATIVE

## 2014-06-12 NOTE — Progress Notes (Signed)
   Subjective:    Patient ID: Janet Richards, female    DOB: 04-29-1961, 53 y.o.   MRN: [id number]  HPI  53 yo female [id number] with nml menses until this year.  She skipped one month then had 2 periods in month.  Pt started bleeding through May and June and came to [facility].  Bleeding was not heavy--staining at times.  US showed retroverted uterus, thin lining, ovarian follicle and small intramural fibroid.   Pt has been on Megace and she is not bleeding currently. Pt would like to stop megace.   Review of Systems  Constitutional: Positive for diaphoresis.  Respiratory: Negative.   Cardiovascular: Negative.   Gastrointestinal: Negative.   Genitourinary: Positive for vaginal bleeding and menstrual problem.  Psychiatric/Behavioral: Negative.        Objective:   Physical Exam  Vitals reviewed. Constitutional: She appears well-developed and well-nourished.  HENT:  Head: Normocephalic and atraumatic.  Eyes: Conjunctivae are normal.  Pulmonary/Chest: Effort normal.  Abdominal: Soft. She exhibits no distension and no mass. There is no tenderness. There is no rebound and no guarding.  Genitourinary:  Tanner 5 Normal external female genitalia Helping vagina normal rugae Cervix is closed and nontender Uterus is normal size retroverted Adnexa no masses nontender    ENDOMETRIAL BIOPSY     The indications for endometrial biopsy were reviewed.   Risks of the biopsy including cramping, bleeding, infection, uterine perforation, inadequate specimen and need for additional procedures  were discussed. The patient states she understands and agrees to undergo procedure today. Consent was signed. Time out was performed. Urine HCG was negative. A sterile speculum was placed in the patient's vagina and the cervix was prepped with Betadine. A single-toothed tenaculum was placed on the anterior lip of the cervix to stabilize it. The 3 mm pipelle was introduced into the endometrial cavity without difficulty  to a depth of 8cm, and a moderate amount of tissue was obtained and sent to pathology. The instruments were removed from the patient's vagina. Minimal bleeding from the cervix was noted. The patient tolerated the procedure well. Routine post-procedure instructions were given to the patient. The patient will follow up to review the results and for further management.           Assessment & Plan:   53 year old G5 para 30-3 perimenopausal female with some dysfunctional uterine bleeding 1-stop Megace 2-endometrial biopsy today 3-call with results and monitor bleeding. 4-Pap and mammogram are up-to-date and normal 5-Nml TSH and FSH

## 2014-06-13 ENCOUNTER — Encounter: Payer: Self-pay | Admitting: Obstetrics & Gynecology

## 2014-06-17 ENCOUNTER — Encounter: Payer: Self-pay | Admitting: Family Medicine

## 2014-06-17 ENCOUNTER — Ambulatory Visit (INDEPENDENT_AMBULATORY_CARE_PROVIDER_SITE_OTHER): Payer: Medicare Other | Admitting: Family Medicine

## 2014-06-17 VITALS — BP 138/77 | HR 106 | Wt 180.0 lb

## 2014-06-17 DIAGNOSIS — K044 Acute apical periodontitis of pulpal origin: Secondary | ICD-10-CM

## 2014-06-17 DIAGNOSIS — B379 Candidiasis, unspecified: Secondary | ICD-10-CM

## 2014-06-17 DIAGNOSIS — K047 Periapical abscess without sinus: Secondary | ICD-10-CM

## 2014-06-17 DIAGNOSIS — K120 Recurrent oral aphthae: Secondary | ICD-10-CM

## 2014-06-17 MED ORDER — FLUCONAZOLE 150 MG PO TABS: 150.0000 mg | ORAL_TABLET | Freq: Once | ORAL | Status: DC

## 2014-06-17 MED ORDER — TRIAMCINOLONE ACETONIDE 0.1 % MT PSTE: 1.0000 "application " | PASTE | Freq: Two times a day (BID) | OROMUCOSAL | Status: DC

## 2014-06-17 NOTE — Progress Notes (Signed)
CC: Janet Richards is a 53 y.o. female is here for dental infection   Subjective: HPI:  Complains of sores in her mouth localized in the lower midline of the buccal mucosa beneath the gum line, additional lesion on the right buccal mucosa. Symptoms have been present for the past 2 days now moderate in severity. She's never had this before. Worse pain when touched nothing else seems to make it better or worse.  Has been accompanied with persistent dental pain on the right posterior inferior molar and left superior posterior molar.  This pain has been unchanged in character severity or frequency since I saw her last continues to be moderate in severity worse with chewing and nonradiating. She denied, chills, dysphagia, difficulty swallowing, swollen lymph nodes, unintentional weight loss or gain nor new skin changes.  Complains of lingering vaginal itching that has been present for the past 2 weeks, it began after soon after she started metronidazole and was significantly improved with one dose of fluconazole. She's been trying to treat it with eating more yogurt in her diet which has kept symptoms  steady and mild in severity without vaginal discharge.  Review Of Systems Outlined In HPI  Past Medical History  Diagnosis Date  . Urticaria   . Essential hypertension, benign   . Primary osteoarthritis of both knees   . Exertional chest pain   . Menstrual migraine     Past Surgical History  Procedure Laterality Date  . Tubal ligation  12/26/1987   Family History  Problem Relation Age of Onset  . Hypertension Mother     History   Social History  . Marital Status: Married    Spouse Name: N/A    Number of Children: N/A  . Years of Education: N/A   Occupational History  . homemaker    Social History Main Topics  . Smoking status: Former Smoker -- 1.50 packs/day    Types: Cigarettes    Quit date: 02/05/2006  . Smokeless tobacco: Never Used  . Alcohol Use: Yes     Comment: rarely   . Drug Use: No  . Sexual Activity: Yes    Partners: Male   Other Topics Concern  . Not on file   Social History Narrative  . No narrative on file     Objective: BP 138/77  Pulse 106  Wt 180 lb (81.647 kg)  LMP 03/30/2014  General: Alert and Oriented, No Acute Distress HEENT: Pupils equal, round, reactive to light. Conjunctivae clear.  Canker sore on the inferior midline of the buccal mucosal approximately 2 mm in diameter, similar appearing lesion on the right buccal mucosal. Continued decay and poor dentition of the right inferior posterior molar and left superior posterior molar. No palpable lymphadenopathy in the cervical region Lungs:  Clear comfortable work of breathing Cardiac:  Regular rate and rhythm Extremities: No peripheral edema.  Strong peripheral pulses.  Mental Status: Requiring frequent redirection, going off multiple tangents deviating from her chief complaint Skin: Warm and dry.  Assessment & Plan: Evadean was seen today for dental infection.  Diagnoses and associated orders for this visit:  Dental infection  Yeast infection - fluconazole (DIFLUCAN) 150 MG tablet; Take 1 tablet (150 mg total) by mouth once.  Canker sore - triamcinolone (KENALOG) 0.1 % paste; Use as directed 1 application in the mouth or throat 2 (two) times daily. For one week.     dental infection: Advised her to establish with a dentist as soon as possible have these  teeth removed Yeast infection: Start one additional dose of fluconazole Canker sore: Start triamcinolone paste and use in the future as needed  40 minutes spent face-to-face during visit today of which at least 50% was counseling or coordinating care regarding: 1. Dental infection   2. Yeast infection   3. Canker sore        Return if symptoms worsen or fail to improve.

## 2014-06-23 ENCOUNTER — Telehealth: Payer: Self-pay | Admitting: *Deleted

## 2014-06-23 NOTE — Telephone Encounter (Signed)
Pt notified of neg endometrial biopsy.

## 2014-06-27 ENCOUNTER — Telehealth: Payer: Self-pay | Admitting: *Deleted

## 2014-06-27 MED ORDER — METRONIDAZOLE 500 MG PO TABS: 500.0000 mg | ORAL_TABLET | Freq: Three times a day (TID) | ORAL | Status: DC

## 2014-06-27 NOTE — Telephone Encounter (Signed)
Patient called and asked if you would send in antiobiotic for her. Margette Fast, CMA

## 2014-06-27 NOTE — Telephone Encounter (Signed)
Janet Richards made aware of script sent to her pharmacy. Margette Fast, CMA

## 2014-06-27 NOTE — Telephone Encounter (Signed)
[facility]

## 2014-07-02 ENCOUNTER — Telehealth: Payer: Self-pay | Admitting: *Deleted

## 2014-07-02 MED ORDER — PREDNISONE 20 MG PO TABS: ORAL_TABLET | ORAL | Status: AC

## 2014-07-02 NOTE — Telephone Encounter (Signed)
Rx sent to [facility] [address] [facility]

## 2014-07-02 NOTE — Telephone Encounter (Signed)
Pt requests rx for prednisone for hives

## 2014-07-18 ENCOUNTER — Encounter: Payer: Self-pay | Admitting: Family Medicine

## 2014-07-18 ENCOUNTER — Ambulatory Visit (INDEPENDENT_AMBULATORY_CARE_PROVIDER_SITE_OTHER): Payer: Medicare Other | Admitting: Family Medicine

## 2014-07-18 VITALS — BP 135/86 | HR 67 | Temp 98.8°F | Wt 182.0 lb

## 2014-07-18 DIAGNOSIS — K044 Acute apical periodontitis of pulpal origin: Secondary | ICD-10-CM

## 2014-07-18 DIAGNOSIS — L505 Cholinergic urticaria: Secondary | ICD-10-CM

## 2014-07-18 DIAGNOSIS — K047 Periapical abscess without sinus: Secondary | ICD-10-CM

## 2014-07-18 MED ORDER — FLUCONAZOLE 150 MG PO TABS: ORAL_TABLET | ORAL | Status: AC

## 2014-07-18 MED ORDER — PREDNISONE 20 MG PO TABS: ORAL_TABLET | ORAL | Status: AC

## 2014-07-18 MED ORDER — AMOXICILLIN-POT CLAVULANATE 500-125 MG PO TABS: ORAL_TABLET | ORAL | Status: DC

## 2014-07-18 NOTE — Progress Notes (Signed)
CC: Janet AMOROSO is a 53 y.o. female is here for side effects from abx   Subjective: HPI:  Complains of a rash that occurred 2 weeks ago described as her typical urticaria outbreak however this time it was not precipitated by sunlight. She was under a great deal of stress due to her husband's declining health and broke out in a rash involving her entire body, she was unable to get hold prednisone until 2 days after the rash started but began to subside after she began to take prednisone. She complains of an outbreak on her right neck after using a new lotion but this is improving with daily hydrocortisone cream over the past 2 days. She denies any wheezing, flushing, rapid heartbeat, diarrhea nor lightheadedness  Complains of pain and discomfort in her right jaw that has been present for the past 2 or 3 days. She does have some degree of pain over the past year but it is raised to a moderate degree recently. Pain is worse when chewing but not when moving her jaw.  Pain is described as sharp, a toothache, and nonradiating.   Review Of Systems Outlined In HPI  Past Medical History  Diagnosis Date  . Urticaria   . Essential hypertension, benign   . Primary osteoarthritis of both knees   . Exertional chest pain   . Menstrual migraine     Past Surgical History  Procedure Laterality Date  . Tubal ligation  12/26/1987   Family History  Problem Relation Age of Onset  . Hypertension Mother     History   Social History  . Marital Status: Married    Spouse Name: N/A    Number of Children: N/A  . Years of Education: N/A   Occupational History  . homemaker    Social History Main Topics  . Smoking status: Former Smoker -- 1.50 packs/day    Types: Cigarettes    Quit date: 02/05/2006  . Smokeless tobacco: Never Used  . Alcohol Use: Yes     Comment: rarely  . Drug Use: No  . Sexual Activity: Yes    Partners: Male   Other Topics Concern  . Not on file   Social History  Narrative  . No narrative on file     Objective: BP 135/86  Pulse 67  Temp(Src) 98.8 F (37.1 C) (Oral)  Wt 182 lb (82.555 kg)  General: Alert and Oriented, No Acute Distress HEENT: Pupils equal, round, reactive to light. Conjunctivae clear.   moist membranes pharynx unremarkable. Poor dentition with what appears to be 80-90% of her right inferior posterior molar now missing from decay, the gum below this is slightly tender but there is no fluctuance.  Lungs: Clear to auscultation bilaterally, no wheezing/ronchi/rales.  Comfortable work of breathing. Good air movement. Cardiac: Regular rate and rhythm. Normal S1/S2.  No murmurs, rubs, nor gallops.   Extremities: No peripheral edema.  Strong peripheral pulses.  Mental Status: No depression, anxiety, nor agitation. Skin: Warm and dry. Erythema and papular rash on the right neck about the area of a hand   Assessment & Plan: Jasmene was seen today for side effects from abx.  Diagnoses and associated orders for this visit:  Cholinergic urticaria - predniSONE (DELTASONE) 20 MG tablet; Three tabs daily days 1-3, two tabs daily days 4-6, one tab daily days 7-9, half tab daily days 10-13.  Dental infection - amoxicillin-clavulanate (AUGMENTIN) 500-125 MG per tablet; Take one by mouth every 8 hours for ten total days. -  fluconazole (DIFLUCAN) 150 MG tablet; Take one tab, may take second tab if no improvement after 72 hours.    Cholinergic urticaria: Acute episode has not resolved, I provided her with a prescription of prednisone to take should the outbreak return so that she can get in her system as soon possible and not need to call our office.  She's requesting an antibiotic for her dental pain and continued decay the tooth. A good deal of time was spent today to ensure that she understands that this tooth and a few other teeth need to be taken out as soon as possible.  She tells me that they are in the process of buying a new house and  she cannot afford a dentist until October, I advised her that I think that this dental issue should take priority over purchasing a new house.   Fluconazole provided for anticipated yeast infection to be taken only as needed.   25 minutes spent face-to-face during visit today of which at least 50% was counseling or coordinating care regarding: 1. Cholinergic urticaria   2. Dental infection        Return if symptoms worsen or fail to improve.

## 2014-07-24 ENCOUNTER — Ambulatory Visit (INDEPENDENT_AMBULATORY_CARE_PROVIDER_SITE_OTHER): Payer: Medicare Other | Admitting: Family Medicine

## 2014-07-24 ENCOUNTER — Encounter: Payer: Self-pay | Admitting: Family Medicine

## 2014-07-24 VITALS — BP 116/75 | HR 104 | Wt 177.0 lb

## 2014-07-24 DIAGNOSIS — B373 Candidiasis of vulva and vagina: Secondary | ICD-10-CM

## 2014-07-24 DIAGNOSIS — I10 Essential (primary) hypertension: Secondary | ICD-10-CM

## 2014-07-24 DIAGNOSIS — B3731 Acute candidiasis of vulva and vagina: Secondary | ICD-10-CM

## 2014-07-24 DIAGNOSIS — R739 Hyperglycemia, unspecified: Secondary | ICD-10-CM

## 2014-07-24 DIAGNOSIS — R7309 Other abnormal glucose: Secondary | ICD-10-CM

## 2014-07-24 LAB — COMPLETE METABOLIC PANEL WITH GFR
ALT: 16 U/L (ref 0–35)
AST: 14 U/L (ref 0–37)
Albumin: 4.4 g/dL (ref 3.5–5.2)
Alkaline Phosphatase: 58 U/L (ref 39–117)
BUN: 10 mg/dL (ref 6–23)
CO2: 26 mEq/L (ref 19–32)
Calcium: 9.8 mg/dL (ref 8.4–10.5)
Chloride: 100 mEq/L (ref 96–112)
Creat: 0.82 mg/dL (ref 0.50–1.10)
GFR, Est African American: >89 mL/min
GFR, Est Non African American: 83 mL/min
Glucose, Bld: 123 mg/dL — ABNORMAL HIGH (ref 70–99)
Potassium: 3.8 mEq/L (ref 3.5–5.3)
Sodium: 137 mEq/L (ref 135–145)
Total Bilirubin: 0.7 mg/dL (ref 0.2–1.2)
Total Protein: 7.1 g/dL (ref 6.0–8.3)

## 2014-07-24 MED ORDER — FLUCONAZOLE 150 MG PO TABS: ORAL_TABLET | ORAL | Status: AC

## 2014-07-24 NOTE — Progress Notes (Signed)
CC: Janet Richards is a 53 y.o. female is here for discuss kidney function   Subjective: HPI:  Followup essential hypertension: Continues on hydrochlorothiazide daily basis no outside blood pressures to report. Denies chest pain shortness of breath orthopnea nor peripheral edema.  Followup hyperglycemia: About a year ago she had a random metabolic panel reflecting mild hyperglycemia. This has not been followed up on. Denies polyuria polyphasia or polydipsia  She's requesting a refill on fluconazole, she's been getting recurrent vaginal itching and redness that improves greatly a day after taking fluconazole but slowly returned over the course of the day or 2 despite eating yogurt daily. Symptoms are only present when she's taking an antibiotic. Denies discharge, bleeding nor any other genitourinary complaints   Review Of Systems Outlined In HPI  Past Medical History  Diagnosis Date  . Urticaria   . Essential hypertension, benign   . Primary osteoarthritis of both knees   . Exertional chest pain   . Menstrual migraine     Past Surgical History  Procedure Laterality Date  . Tubal ligation  12/26/1987   Family History  Problem Relation Age of Onset  . Hypertension Mother     History   Social History  . Marital Status: Married    Spouse Name: N/A    Number of Children: N/A  . Years of Education: N/A   Occupational History  . homemaker    Social History Main Topics  . Smoking status: Former Smoker -- 1.50 packs/day    Types: Cigarettes    Quit date: 02/05/2006  . Smokeless tobacco: Never Used  . Alcohol Use: Yes     Comment: rarely  . Drug Use: No  . Sexual Activity: Yes    Partners: Male   Other Topics Concern  . Not on file   Social History Narrative  . No narrative on file     Objective: BP 116/75  Pulse 104  Wt 177 lb (80.287 kg)  Vital signs reviewed. General: Alert and Oriented, No Acute Distress HEENT: Pupils equal, round, reactive to light.  Conjunctivae clear.  External ears unremarkable.  Moist mucous membranes. Lungs: Clear and comfortable work of breathing, speaking in full sentences without accessory muscle use. Cardiac: Regular rate and rhythm.  Neuro: CN II-XII grossly intact, gait normal. Extremities: No peripheral edema.  Strong peripheral pulses.  Mental Status: No depression, anxiety, nor agitation. Logical though process. Skin: Warm and dry.  Assessment & Plan: Janet Richards was seen today for discuss kidney function.  Diagnoses and associated orders for this visit:  Essential hypertension, benign - COMPLETE METABOLIC PANEL WITH GFR  Vaginal candidiasis - fluconazole (DIFLUCAN) 150 MG tablet; Take one tab, may take second tab if no improvement after 72 hours.  Hyperglycemia - COMPLETE METABOLIC PANEL WITH GFR    Hypertension: Controlled, continue hydrochlorothiazide, checking renal function today Hyperglycemia: Recheck glucose today she is not fasting Vaginal candidiasis: Refills for Diflucan to last her until she finishes her full course of amoxicillin  Return if symptoms worsen or fail to improve.

## 2014-07-28 ENCOUNTER — Telehealth: Payer: Self-pay | Admitting: *Deleted

## 2014-07-28 NOTE — Telephone Encounter (Signed)
Pt called and left on my voice mail that the last two days she has had a couple of episodes in which she got very hot and  started sweating profusely and  Had what she describes as "prickley feelings" in her chest. Pt denies SOB, or chest pain  and at present she is not having any sxs. Pt denies nausea but she does state that she feels dizzy and very tired. Advised pt that she did need to be eval. Pt transferred to be scheduled for an appt and advised her that in the meantime if sxs returned and worsened or she developed chest pain, or radiating pain to shoulder from chest any nausea, sob, vomiting ect then she should immediately go to the ER. Pt voiced understanding

## 2014-07-30 ENCOUNTER — Other Ambulatory Visit: Payer: Self-pay

## 2014-07-30 MED ORDER — HYDROCHLOROTHIAZIDE 25 MG PO TABS
25.0000 mg | ORAL_TABLET | Freq: Every day | ORAL | Status: DC
Start: 2014-07-30 — End: 2015-04-29

## 2014-07-30 MED ORDER — OMEPRAZOLE 20 MG PO CPDR: 20.0000 mg | DELAYED_RELEASE_CAPSULE | Freq: Every day | ORAL | Status: DC

## 2014-08-05 ENCOUNTER — Encounter: Payer: Self-pay | Admitting: Family Medicine

## 2014-08-05 ENCOUNTER — Ambulatory Visit: Payer: Medicare Other

## 2014-08-05 ENCOUNTER — Ambulatory Visit (INDEPENDENT_AMBULATORY_CARE_PROVIDER_SITE_OTHER): Payer: Medicare Other | Admitting: Family Medicine

## 2014-08-05 VITALS — BP 115/65 | HR 91 | Wt 180.0 lb

## 2014-08-05 DIAGNOSIS — R079 Chest pain, unspecified: Secondary | ICD-10-CM

## 2014-08-05 DIAGNOSIS — K2100 Gastro-esophageal reflux disease with esophagitis, without bleeding: Secondary | ICD-10-CM

## 2014-08-05 MED ORDER — PANTOPRAZOLE SODIUM 40 MG PO TBEC: 40.0000 mg | DELAYED_RELEASE_TABLET | Freq: Every day | ORAL | Status: DC

## 2014-08-05 NOTE — Progress Notes (Signed)
CC: Janet Richards is a 53 y.o. female is here for Chest Pain   Subjective: HPI:   Complains of 3 days of chest pain that occurred a little over one week ago. It was described as substernal, nonradiating, sharp, burning, accompanied by fatigue and shortness of breath.  Shortness of breath was mild, pain with moderate. Slightly improved with belching, no exertional component to the pain. She's never had this before. Pain was improved with eating. Nothing else made it better or worse.  She's been able to exercise most days of the week without any reproduction or exacerbation of her pain. When it was present it was persistent throughout the whole day. She denies fevers, chills, or regular heartbeat, nausea, vomiting, constipation, diarrhea, diaphoresis nor any motor or sensory disturbances  Review Of Systems Outlined In HPI  Past Medical History  Diagnosis Date  . Urticaria   . Essential hypertension, benign   . Primary osteoarthritis of both knees   . Exertional chest pain   . Menstrual migraine     Past Surgical History  Procedure Laterality Date  . Tubal ligation  12/26/1987   Family History  Problem Relation Age of Onset  . Hypertension Mother     History   Social History  . Marital Status: Married    Spouse Name: N/A    Number of Children: N/A  . Years of Education: N/A   Occupational History  . homemaker    Social History Main Topics  . Smoking status: Former Smoker -- 1.50 packs/day    Types: Cigarettes    Quit date: 02/05/2006  . Smokeless tobacco: Never Used  . Alcohol Use: Yes     Comment: rarely  . Drug Use: No  . Sexual Activity: Yes    Partners: Male   Other Topics Concern  . Not on file   Social History Narrative  . No narrative on file     Objective: BP 115/65  Pulse 91  Wt 180 lb (81.647 kg)  General: Alert and Oriented, No Acute Distress HEENT: Pupils equal, round, reactive to light. Conjunctivae clear.  Moist mucous membranes pharynx  unremarkable Lungs: Clear to auscultation bilaterally, no wheezing/ronchi/rales.  Comfortable work of breathing. Good air movement. Cardiac: Regular rate and rhythm. Normal S1/S2.  No murmurs, rubs, nor gallops.   Abdomen: Normal bowel sounds, soft without guarding or rebound. Pain is reproduced with palpation of the epigastric region. No pain in the other quadrants of the abdomen with palpation Extremities: No peripheral edema.  Strong peripheral pulses.  Mental Status: No depression, anxiety, nor agitation. Skin: Warm and dry.  Assessment & Plan: [name] was seen today for chest pain.  Diagnoses and associated orders for this visit:  Gastroesophageal reflux disease with esophagitis - pantoprazole (PROTONIX) 40 MG tablet; Take 1 tablet (40 mg total) by mouth daily.  Chest pain, unspecified chest pain type - EKG 12-Lead    Suspect her chest pain was due to uncontrolled gastroesophageal reflux therefore stop omeprazole and begin daily Protonix. EKG was obtained showing normal sinus rhythm, normal axis, normal intervals, no pathologic Q waves or ST segment elevation or depression. Low suspicion of cardiopulmonary source causing her pain   Return if symptoms worsen or fail to improve.

## 2014-08-06 ENCOUNTER — Ambulatory Visit (INDEPENDENT_AMBULATORY_CARE_PROVIDER_SITE_OTHER): Payer: Medicare Other

## 2014-08-06 DIAGNOSIS — Z1231 Encounter for screening mammogram for malignant neoplasm of breast: Secondary | ICD-10-CM

## 2014-08-06 DIAGNOSIS — Z1239 Encounter for other screening for malignant neoplasm of breast: Secondary | ICD-10-CM

## 2014-08-07 ENCOUNTER — Other Ambulatory Visit: Payer: Self-pay | Admitting: *Deleted

## 2014-08-07 ENCOUNTER — Telehealth: Payer: Self-pay | Admitting: *Deleted

## 2014-08-07 DIAGNOSIS — K2100 Gastro-esophageal reflux disease with esophagitis, without bleeding: Secondary | ICD-10-CM

## 2014-08-07 MED ORDER — PANTOPRAZOLE SODIUM 40 MG PO TBEC: 40.0000 mg | DELAYED_RELEASE_TABLET | Freq: Every day | ORAL | Status: DC

## 2014-08-07 NOTE — Telephone Encounter (Signed)
Pt requests a 90 rx of pantoprazole sent instead of 30

## 2014-08-14 ENCOUNTER — Encounter: Payer: Self-pay | Admitting: Family Medicine

## 2014-08-14 ENCOUNTER — Ambulatory Visit (INDEPENDENT_AMBULATORY_CARE_PROVIDER_SITE_OTHER): Payer: Medicare Other | Admitting: Family Medicine

## 2014-08-14 VITALS — BP 120/77 | HR 92 | Wt 183.0 lb

## 2014-08-14 DIAGNOSIS — H1132 Conjunctival hemorrhage, left eye: Secondary | ICD-10-CM

## 2014-08-14 DIAGNOSIS — K047 Periapical abscess without sinus: Secondary | ICD-10-CM

## 2014-08-14 MED ORDER — AMOXICILLIN-POT CLAVULANATE 500-125 MG PO TABS: ORAL_TABLET | ORAL | Status: AC

## 2014-08-14 NOTE — Progress Notes (Signed)
CC: Janet Richards is a 53 y.o. female is here for broken blood vessle in eye   Subjective: HPI:  Patient complains of reddening in the left eye that is localized to the "white part"of the that was present 2 days ago and resolved after 48 hours of no intervention. Symptoms came on acutely and gradually improved. She's never had this before. She denies any ocular pain or vision loss before during or after the event. She denies any complaints with the left eye now.  She complains of continued dental pain in the left posterior superior molar and right inferior posterior molar. She was scheduled to have these teeth pulled yesterday however she was urgently needed that day to transfer her husband to [place] to have a CT scan with his epilepsy workup. She missed the appointment and does not have a return appointment until February 2016. She anticipates going to a free dental clinic in [place] next week to see if they can help her. She reports a headache which is further described as pain in the bones or her molars are located. She denies fevers, chills, nausea, vomiting, diaphoresis nor sweats.   Review Of Systems Outlined In HPI  Past Medical History  Diagnosis Date  . Urticaria   . Essential hypertension, benign   . Primary osteoarthritis of both knees   . Exertional chest pain   . Menstrual migraine     Past Surgical History  Procedure Laterality Date  . Tubal ligation  12/26/1987   Family History  Problem Relation Age of Onset  . Hypertension Mother     History   Social History  . Marital Status: Married    Spouse Name: N/A    Number of Children: N/A  . Years of Education: N/A   Occupational History  . homemaker    Social History Main Topics  . Smoking status: Former Smoker -- 1.50 packs/day    Types: Cigarettes    Quit date: 02/05/2006  . Smokeless tobacco: Never Used  . Alcohol Use: Yes     Comment: rarely  . Drug Use: No  . Sexual Activity: Yes   Partners: Male   Other Topics Concern  . Not on file   Social History Narrative  . No narrative on file     Objective: BP 120/77  Pulse 92  Wt 183 lb (83.008 kg)  General: Alert and Oriented, No Acute Distress HEENT: Pupils equal, round, reactive to light. Conjunctivae clear.  Moist membranes pharynx unremarkable extensive dental decay on the superior right posterior and inferior left posterior molar with surrounding gum tenderness but no pus from the base of the gums. Bilateral conjunctiva are unremarkable, anterior chamber is open without debris bilaterally. Pupils round reactive to light bilaterally. No ocular discharge. No photophobia on exam Extremities: No peripheral edema.  Strong peripheral pulses.  Mental Status: No depression, anxiety, nor agitation. Skin: Warm and dry.  Assessment & Plan: Janet Richards was seen today for broken blood vessle in eye.  Diagnoses and associated orders for this visit:  Dental infection - amoxicillin-clavulanate (AUGMENTIN) 500-125 MG per tablet; Take one by mouth every 8 hours for ten total days.  Subconjunctival hemorrhage, left    Dental infection: As always I encouraged her to have both of these teeth removed as soon as possible and not to wait any longer. Augmentin to help with return of infection. Subconjunctival hemorrhage: Discussed with patient that she most likely had a very mild hemorrhage that resolved on its own as would be  expected and if these are benign. Reassurance provided that her vision testing was normal today   Return if symptoms worsen or fail to improve.

## 2014-08-25 ENCOUNTER — Telehealth: Payer: Self-pay | Admitting: *Deleted

## 2014-08-25 MED ORDER — AMOXICILLIN-POT CLAVULANATE 500-125 MG PO TABS: ORAL_TABLET | ORAL | Status: AC

## 2014-08-25 NOTE — Telephone Encounter (Signed)
Amoxicillin sent to [facility] [address]

## 2014-08-25 NOTE — Addendum Note (Signed)
Addended by: Marcial Pacas on: 08/25/2014 05:07 PM   Modules accepted: Orders

## 2014-08-25 NOTE — Telephone Encounter (Signed)
Pt had two teeth pulled and she did get an abx. Pt is requesting another round of abx because they didn't "clean her gums" and she has to go back for this this sat and she doesn't want to get infection

## 2014-08-26 NOTE — Telephone Encounter (Signed)
Message left on vm 

## 2014-09-08 ENCOUNTER — Encounter: Payer: Self-pay | Admitting: Family Medicine

## 2014-09-29 ENCOUNTER — Ambulatory Visit (INDEPENDENT_AMBULATORY_CARE_PROVIDER_SITE_OTHER): Payer: Medicare Other | Admitting: Family Medicine

## 2014-09-29 ENCOUNTER — Encounter: Payer: Self-pay | Admitting: Family Medicine

## 2014-09-29 VITALS — BP 114/67 | HR 85 | Temp 98.1°F | Wt 185.0 lb

## 2014-09-29 DIAGNOSIS — R109 Unspecified abdominal pain: Secondary | ICD-10-CM

## 2014-09-29 DIAGNOSIS — R319 Hematuria, unspecified: Secondary | ICD-10-CM

## 2014-09-29 DIAGNOSIS — K2100 Gastro-esophageal reflux disease with esophagitis, without bleeding: Secondary | ICD-10-CM

## 2014-09-29 LAB — POCT URINALYSIS DIPSTICK
Bilirubin, UA: NEGATIVE
Glucose, UA: NEGATIVE
Leukocytes, UA: NEGATIVE
Nitrite, UA: NEGATIVE
Spec Grav, UA: 1.025
Urobilinogen, UA: 0.2
pH, UA: 6

## 2014-09-29 MED ORDER — CIPROFLOXACIN HCL 250 MG PO TABS: ORAL_TABLET | ORAL | Status: AC

## 2014-09-29 MED ORDER — PANTOPRAZOLE SODIUM 40 MG PO TBEC: 40.0000 mg | DELAYED_RELEASE_TABLET | Freq: Every day | ORAL | Status: DC

## 2014-09-29 NOTE — Progress Notes (Signed)
CC: Janet Richards is a 53 y.o. female is here for Cystitis   Subjective: HPI:  Complains of urinary urgency and pelvic pressure that has been present for 1 week worsening on a daily basis. Currently it is mild to moderate in severity. Urgency is awakening her in her sleep. She's never had this problem before. No interventions as of yet, symptoms are present all hours of the day. She denies fevers, chills, vaginal discharge, vaginal bleeding, blood in urine, dysuria. She denies flank pain. Review of systems positive for lower abdominal discomfort.  Follow GERD: Since starting on Protonix she states she's had a significant improvement of her reflux symptoms without any known side effects  Review Of Systems Outlined In HPI  Past Medical History  Diagnosis Date  . Urticaria   . Essential hypertension, benign   . Primary osteoarthritis of both knees   . Exertional chest pain   . Menstrual migraine     Past Surgical History  Procedure Laterality Date  . Tubal ligation  12/26/1987   Family History  Problem Relation Age of Onset  . Hypertension Mother     History   Social History  . Marital Status: Married    Spouse Name: N/A    Number of Children: N/A  . Years of Education: N/A   Occupational History  . homemaker    Social History Main Topics  . Smoking status: Former Smoker -- 1.50 packs/day    Types: Cigarettes    Quit date: 02/05/2006  . Smokeless tobacco: Never Used  . Alcohol Use: Yes     Comment: rarely  . Drug Use: No  . Sexual Activity:    Partners: Male   Other Topics Concern  . Not on file   Social History Narrative     Objective: BP 114/67 mmHg  Pulse 85  Temp(Src) 98.1 F (36.7 C) (Oral)  Wt 185 lb (83.915 kg)  General: Alert and Oriented, No Acute Distress HEENT: Pupils equal, round, reactive to light. Conjunctivae clear.  Moist mucous membranes times unremarkable Lungs: Clear to auscultation bilaterally, no wheezing/ronchi/rales.  Comfortable  work of breathing. Good air movement. Cardiac: Regular rate and rhythm. Normal S1/S2.  No murmurs, rubs, nor gallops.   Abdomen: Obese and soft, no CVA tenderness Extremities: No peripheral edema.  Strong peripheral pulses.  Mental Status: No depression, anxiety, nor agitation. Skin: Warm and dry.  Assessment & Plan: Charisse was seen today for cystitis.  Diagnoses and associated orders for this visit:  Abdominal discomfort - Urinalysis Dipstick  Hematuria - ciprofloxacin (CIPRO) 250 MG tablet; Take one by mouth twice a day for five days. - Urine culture  Gastroesophageal reflux disease with esophagitis - pantoprazole (PROTONIX) 40 MG tablet; Take 1 tablet (40 mg total) by mouth daily.    Hematuria on urinalysis suspicious for UTI therefore start Cipro pending culture. GERD: Improved on Protonix, continue daily dosing   Return if symptoms worsen or fail to improve.

## 2014-10-01 LAB — URINE CULTURE: Colony Count: 25000

## 2014-10-07 ENCOUNTER — Ambulatory Visit: Payer: Medicare Other | Admitting: Family Medicine

## 2014-10-07 VITALS — Resp 20

## 2014-10-07 DIAGNOSIS — R300 Dysuria: Secondary | ICD-10-CM

## 2014-10-07 NOTE — Progress Notes (Signed)
   Subjective:    Patient ID: Janet Richards, female    DOB: 1961/04/24, 53 y.o.   MRN: [id number]  HPI   Pt is here to leave a specimen for urine culture. Pt states she is still having some lower abd pain. Review of Systems     Objective:   Physical Exam        Assessment & Plan:

## 2014-10-09 ENCOUNTER — Emergency Department (INDEPENDENT_AMBULATORY_CARE_PROVIDER_SITE_OTHER)
Admission: EM | Admit: 2014-10-09 | Discharge: 2014-10-09 | Disposition: A | Discharge status: Home / Self Care | Payer: Medicare Other | Source: Home / Self Care | Attending: Emergency Medicine | Admitting: Emergency Medicine

## 2014-10-09 ENCOUNTER — Encounter: Payer: Self-pay | Admitting: *Deleted

## 2014-10-09 ENCOUNTER — Encounter: Payer: Self-pay | Admitting: Family Medicine

## 2014-10-09 DIAGNOSIS — J0101 Acute recurrent maxillary sinusitis: Secondary | ICD-10-CM

## 2014-10-09 DIAGNOSIS — H65111 Acute and subacute allergic otitis media (mucoid) (sanguinous) (serous), right ear: Secondary | ICD-10-CM

## 2014-10-09 LAB — URINE CULTURE: Colony Count: 15000

## 2014-10-09 MED ORDER — FLUCONAZOLE 150 MG PO TABS: 150.0000 mg | ORAL_TABLET | Freq: Once | ORAL | Status: DC

## 2014-10-09 MED ORDER — CEFDINIR 300 MG PO CAPS: 300.0000 mg | ORAL_CAPSULE | Freq: Two times a day (BID) | ORAL | Status: DC

## 2014-10-09 NOTE — ED Notes (Signed)
Sinus pain and HA with ear pain x 2-3 days. Diarrhea x today.

## 2014-10-09 NOTE — ED Provider Notes (Signed)
CSN: [id number]     Arrival date & time 10/09/14  1752 History   First MD Initiated Contact with Patient 10/09/14 1809     Chief Complaint  Patient presents with  . Facial Pain  . Headache   (Consider location/radiation/quality/duration/timing/severity/associated sxs/prior Treatment) HPI SINUSITIS  Onset: 3-4 days Facial/sinus pressure with discolored nasal mucus.    Severity: moderate Tried OTC meds without significant relief.  Symptoms:  + Fever  + URI prodrome with nasal congestion + Minimal swollen neck glands + mild Sinus Headache + mild ear pressure  No Allergy symptoms No significant Sore Throat No eye symptoms     No significant Cough No chest pain No shortness of breath  No wheezing  No Abdominal Pain No Nausea No Vomiting No diarrhea  No Myalgias No focal neurologic symptoms No syncope No Rash  No Urinary symptoms          Past Medical History  Diagnosis Date  . Urticaria   . Essential hypertension, benign   . Primary osteoarthritis of both knees   . Exertional chest pain   . Menstrual migraine    Past Surgical History  Procedure Laterality Date  . Tubal ligation  12/26/1987   Family History  Problem Relation Age of Onset  . Hypertension Mother    History  Substance Use Topics  . Smoking status: Former Smoker -- 1.50 packs/day    Types: Cigarettes    Quit date: 02/05/2006  . Smokeless tobacco: Never Used  . Alcohol Use: Yes     Comment: rarely   OB History    Gravida Para Term Preterm AB TAB SAB Ectopic Multiple Living   5 3 3  2 2    3      Review of Systems  All other systems reviewed and are negative.   Allergies  Aspirin; Clindamycin/lincomycin; Cortisporin; Erythromycin; Metronidazole; Morphine and related; Nsaids; and Penicillins  Home Medications   Prior to Admission medications   Medication Sig Start Date End Date Taking? Authorizing Provider  albuterol (PROVENTIL HFA;VENTOLIN HFA) 108 (90 BASE) MCG/ACT  inhaler Inhale into the lungs every 6 (six) hours as needed for wheezing or shortness of breath.    Historical Provider, MD  azelastine (ASTELIN) 0.1 % nasal spray Place 1 spray into both nostrils 2 (two) times daily. Use in each nostril as directed 06/03/14   Marcial Pacas, DO  carisoprodol (SOMA) 350 MG tablet Take 1 tablet (350 mg total) by mouth 3 (three) times daily as needed (back pain). 08/08/13   Sean Hommel, DO  cefdinir (OMNICEF) 300 MG capsule Take 1 capsule (300 mg total) by mouth 2 (two) times daily. X 10 days 10/09/14   [name], MD  chlorhexidine (PERIDEX) 0.12 % solution Use as directed 15 mLs in the mouth or throat 4 (four) times daily. 03/15/14   Kandra Nicolas, MD  cyclobenzaprine (FLEXERIL) 10 MG tablet Take a half to a full tab every 8-12 hours only as needed for muscle spasm, may cause sedation. 08/08/13   Sean Hommel, DO  Fexofenadine HCl (ALLEGRA PO) Take by mouth.    Historical Provider, MD  fluconazole (DIFLUCAN) 150 MG tablet Take 1 tablet (150 mg total) by mouth once. Take 1 now, then may repeat x1 in 4 days, for yeast infection. 10/09/14   [name], MD  hydrochlorothiazide (HYDRODIURIL) 25 MG tablet Take 1 tablet (25 mg total) by mouth daily. 07/30/14   Marcial Pacas, DO  hydrOXYzine (ATARAX/VISTARIL) 10 MG tablet Take 10 mg by mouth  3 (three) times daily as needed.    Historical Provider, MD  lidocaine (XYLOCAINE) 2 % solution Use as directed 20 mLs in the mouth or throat as needed for mouth pain. 03/20/14   Sean Hommel, DO  loratadine (CLARITIN) 10 MG tablet Take 10 mg by mouth daily.    Historical Provider, MD  megestrol (MEGACE) 40 MG tablet Take 1 tablet (40 mg total) by mouth 2 (two) times daily. When bleeding slows, take 1 per day 04/26/14   Seabron Spates, CNM  methocarbamol (ROBAXIN) 500 MG tablet Take 1 tablet (500 mg total) by mouth every 8 (eight) hours as needed for muscle spasms. 11/22/13   Marcial Pacas, DO  montelukast (SINGULAIR) 10 MG tablet  04/04/13    Historical Provider, MD  Multiple Vitamins-Minerals (MULTIVITAMIN PO) Take by mouth.    Historical Provider, MD  pantoprazole (PROTONIX) 40 MG tablet Take 1 tablet (40 mg total) by mouth daily. 09/29/14   Marcial Pacas, DO  traMADol (ULTRAM) 50 MG tablet Take 1 tablet (50 mg total) by mouth every 8 (eight) hours as needed for pain. 04/19/13   Silverio Decamp, MD  triamcinolone (KENALOG) 0.1 % paste Use as directed 1 application in the mouth or throat 2 (two) times daily. For one week. 06/17/14   Sean Hommel, DO   BP 135/90 mmHg  Pulse 90  Temp(Src) 98.8 F (37.1 C) (Oral)  Resp 16  Ht 5\' 3"  (1.6 m)  Wt 186 lb (84.369 kg)  BMI 32.96 kg/m2  SpO2 100% Physical Exam  Constitutional: She is oriented to person, place, and time. She appears well-developed and well-nourished. No distress.  HENT:  Head: Normocephalic and atraumatic.  Right Ear: External ear and ear canal normal. Tympanic membrane is erythematous. A middle ear effusion is present.  Left Ear: Tympanic membrane, external ear and ear canal normal.  Nose: Mucosal edema and rhinorrhea present. Right sinus exhibits maxillary sinus tenderness. Left sinus exhibits maxillary sinus tenderness.  Mouth/Throat: Oropharynx is clear and moist. No oral lesions. No oropharyngeal exudate.  Eyes: Right eye exhibits no discharge. Left eye exhibits no discharge. No scleral icterus.  Neck: Neck supple.  Cardiovascular: Normal rate, regular rhythm and normal heart sounds.   Pulmonary/Chest: Effort normal and breath sounds normal. She has no wheezes. She has no rales.  Lymphadenopathy:    She has no cervical adenopathy.  Neurological: She is alert and oriented to person, place, and time.  Skin: Skin is warm and dry.  Nursing note and vitals reviewed.   ED Course  Procedures (including critical care time) Labs Review Labs Reviewed - No data to display  Imaging Review No results found.   MDM   1. Acute recurrent maxillary sinusitis   2.  Acute mucoid otitis media of right ear     Treatment options discussed, as well as risks, benefits, alternatives. Patient voiced understanding and agreement with the following plans: New Prescriptions   CEFDINIR (OMNICEF) 300 MG CAPSULE    Take 1 capsule (300 mg total) by mouth 2 (two) times daily. X 10 days   FLUCONAZOLE (DIFLUCAN) 150 MG TABLET    Take 1 tablet (150 mg total) by mouth once. Take 1 now, then may repeat x1 in 4 days, for yeast infection.   carefully reviewed her allergy details. Has taken cephalosporins and Diflucan in the past without problems. Continue with the sinus allergy nasal spray as prescribed by Dr. Ileene Rubens. She avoids NSAIDs because of allergies. Okay to use Tylenol when necessary headache.  Other symptomatic care discussed. Over 25 minutes spent, greater than 50% of the time spent for counseling and coordination of care. Follow-up with your primary care doctor in 5-7 days if not improving, or sooner if symptoms become worse. Precautions discussed. Red flags discussed. Questions invited and answered. Patient voiced understanding and agreement.    [name], MD 10/11/14 [phone]

## 2014-10-17 ENCOUNTER — Encounter: Payer: Self-pay | Admitting: Family Medicine

## 2014-10-17 ENCOUNTER — Ambulatory Visit (INDEPENDENT_AMBULATORY_CARE_PROVIDER_SITE_OTHER): Payer: Medicare Other | Admitting: Family Medicine

## 2014-10-17 VITALS — BP 119/80 | HR 94 | Ht 63.0 in | Wt 181.0 lb

## 2014-10-17 DIAGNOSIS — R42 Dizziness and giddiness: Secondary | ICD-10-CM

## 2014-10-17 DIAGNOSIS — Z23 Encounter for immunization: Secondary | ICD-10-CM

## 2014-10-17 DIAGNOSIS — R7301 Impaired fasting glucose: Secondary | ICD-10-CM

## 2014-10-17 DIAGNOSIS — R7303 Prediabetes: Secondary | ICD-10-CM | POA: Insufficient documentation

## 2014-10-17 DIAGNOSIS — R10811 Right upper quadrant abdominal tenderness: Secondary | ICD-10-CM

## 2014-10-17 DIAGNOSIS — R102 Pelvic and perineal pain: Secondary | ICD-10-CM

## 2014-10-17 DIAGNOSIS — R739 Hyperglycemia, unspecified: Secondary | ICD-10-CM

## 2014-10-17 DIAGNOSIS — R110 Nausea: Secondary | ICD-10-CM

## 2014-10-17 DIAGNOSIS — M25559 Pain in unspecified hip: Secondary | ICD-10-CM

## 2014-10-17 HISTORY — DX: Prediabetes: R73.03

## 2014-10-17 LAB — POCT GLYCOSYLATED HEMOGLOBIN (HGB A1C): Hemoglobin A1C: 5.9

## 2014-10-17 NOTE — Progress Notes (Signed)
   Subjective:    Patient ID: Janet Richards, female    DOB: 1960-11-18, 53 y.o.   MRN: [id number]  HPI Had 2 teeth extracted about 1 months ago.  Says has soreness in her gums and has had some lower right jaw pain. Had been hurting for 1.5 years before it was pulled.  Soreness in her neck over the last week or so. Says used the free clinic so can't get back in.  She think she may have some gingivitis again. She's had this in the past. But she is currently on antibiotics for sinus infection.  She also complains of some pelvic pressure and discomfort. No dysuria or hematuria. She was also initially having bilateral side pain just above the hip bones. She says that pain is a little bit better but still having some pelvic discomfort. She's noticed a vaginal odor that started about a week. We've redone 2 urine cultures here in the office over the last 2 weeks that were negative.  Has been getting lightheaded and nauseated. Hasn't vomited but says she has felt like she was going to several times. Still on antibiotics for the sinus infection. Says sitting up causes more lightheadedness   says she has a history of gallstones but was told in the past they were small and that they should pass without any complications.  Review of Systems     Objective:   Physical Exam  Constitutional: She is oriented to person, place, and time. She appears well-developed and well-nourished.  HENT:  Head: Normocephalic and atraumatic.  Neck:  Gums look like they are healing well. No redenss or swelling or discharge. She has several other decaying teeth.   Cardiovascular: Normal rate, regular rhythm and normal heart sounds.   Pulmonary/Chest: Effort normal and breath sounds normal.  Abdominal: Soft. Bowel sounds are normal. She exhibits no distension and no mass. There is tenderness. There is no rebound and no guarding.  + TTP in the RUQ.    Neurological: She is alert and oriented to person, place, and time.  Skin:  Skin is warm and dry.  Psychiatric: She has a normal mood and affect. Her behavior is normal.          Assessment & Plan:  Right upper quadrant tenderness with nausea-I would like to set her up for repeat ultrasound. Make sure eating a bland diet.  Pelvic pain-wet prep performed today especially since she's noticed a vaginal odor. Did not repeat urinalysis today as she is Re: Had 2 negative cultures. If wet prep is negative then consider ultrasound of the uterus and ovaries for further evaluation.  Dental pain - encouraged her to complete her anabolic she's currently on for her sinus infection. She actually needs several other teeth extracted as well.  Lightheadedness-blood pressure actually looks great today. She says she's been checked for thyroid disorder in the past. I would like to check her hemoglobin A1c since she did have an elevated glucose on recent blood work that she was not fasting.  IFG - hemoglobin A1c 5.9. We discussed new diagnosis.  Discussed the importance of avoiding concentrated sweets and sugars and watching her portion sizes on carbohydrates. 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 Recommend repeat hemoglobin A1c in 6 months and continue to monitor this.

## 2014-10-18 LAB — WET PREP, GENITAL
Clue Cells Wet Prep HPF POC: NONE SEEN
Trich, Wet Prep: NONE SEEN
WBC, Wet Prep HPF POC: NONE SEEN
Yeast Wet Prep HPF POC: NONE SEEN

## 2014-10-20 ENCOUNTER — Other Ambulatory Visit: Payer: Self-pay | Admitting: Family Medicine

## 2014-10-20 DIAGNOSIS — R102 Pelvic and perineal pain: Secondary | ICD-10-CM

## 2014-10-22 ENCOUNTER — Ambulatory Visit (INDEPENDENT_AMBULATORY_CARE_PROVIDER_SITE_OTHER): Payer: Medicare Other

## 2014-10-22 ENCOUNTER — Ambulatory Visit (INDEPENDENT_AMBULATORY_CARE_PROVIDER_SITE_OTHER): Payer: Medicare Other | Admitting: Family Medicine

## 2014-10-22 ENCOUNTER — Encounter: Payer: Self-pay | Admitting: Family Medicine

## 2014-10-22 VITALS — BP 134/74 | HR 86 | Wt 186.0 lb

## 2014-10-22 DIAGNOSIS — N858 Other specified noninflammatory disorders of uterus: Secondary | ICD-10-CM

## 2014-10-22 DIAGNOSIS — R110 Nausea: Secondary | ICD-10-CM

## 2014-10-22 DIAGNOSIS — K047 Periapical abscess without sinus: Secondary | ICD-10-CM

## 2014-10-22 DIAGNOSIS — R102 Pelvic and perineal pain: Secondary | ICD-10-CM

## 2014-10-22 DIAGNOSIS — N854 Malposition of uterus: Secondary | ICD-10-CM

## 2014-10-22 DIAGNOSIS — R10811 Right upper quadrant abdominal tenderness: Secondary | ICD-10-CM

## 2014-10-22 DIAGNOSIS — S76011A Strain of muscle, fascia and tendon of right hip, initial encounter: Secondary | ICD-10-CM

## 2014-10-22 DIAGNOSIS — S76311A Strain of muscle, fascia and tendon of the posterior muscle group at thigh level, right thigh, initial encounter: Secondary | ICD-10-CM

## 2014-10-22 MED ORDER — FLUCONAZOLE 150 MG PO TABS: ORAL_TABLET | ORAL | Status: AC

## 2014-10-22 MED ORDER — AMOXICILLIN-POT CLAVULANATE 500-125 MG PO TABS: ORAL_TABLET | ORAL | Status: AC

## 2014-10-22 NOTE — Progress Notes (Signed)
CC: Janet Richards is a 53 y.o. female is here for right hip pain   Subjective: HPI:  Dental and gum discomfort localized on the inferior, posterior, right teeth. Symptoms have been present for the past week slightly better when on Omnicef however symptoms have been worsening since she stopped this medication. It hurts when she is chewing or when she touches this part of her mouth with her tongue. Nothing else seems to make better or worse. She reports fatigue but denies fevers, chills, dysphagia, nor nausea.   complains of right buttock pain that has been present for the past 2-4 days. It's worse when walking or sitting for long periods of time. Nothing particularly makes it better and she's had no interventions as of yet. It came on overnight and has been slowly improving on a daily basis now moderate in severity. Denies any joint pain or muscle pain elsewhere. Pain is nonradiating. She denies motor or sensory disturbance in the lower extremities   Review Of Systems Outlined In HPI  Past Medical History  Diagnosis Date  . Urticaria   . Essential hypertension, benign   . Primary osteoarthritis of both knees   . Exertional chest pain   . Menstrual migraine     Past Surgical History  Procedure Laterality Date  . Tubal ligation  12/26/1987   Family History  Problem Relation Age of Onset  . Hypertension Mother     History   Social History  . Marital Status: Married    Spouse Name: N/A    Number of Children: N/A  . Years of Education: N/A   Occupational History  . homemaker    Social History Main Topics  . Smoking status: Former Smoker -- 1.50 packs/day    Types: Cigarettes    Quit date: 02/05/2006  . Smokeless tobacco: Never Used  . Alcohol Use: Yes     Comment: rarely  . Drug Use: No  . Sexual Activity:    Partners: Male   Other Topics Concern  . Not on file   Social History Narrative     Objective: BP 134/74 mmHg  Pulse 86  Wt 186 lb (84.369  kg)  General: Alert and Oriented, No Acute Distress HEENT: Pupils equal, round, reactive to light. ConjunctivMoist mucous membranes, single decaying tooth with mild erythema of the surrounding gums without any discharge on the inferior right posterior most tooth.  ungs: C Clear and comfortable work of breathing  Cardiac: Regular rate and rhythm.  Extremities: No peripheral edema.  Strong peripheral pulses.  full range of motion and strength in the right lower extremity, pain is reproduced with hip extension absent with hip flexion or abduction.  Mental Status: No depression, anxiety, nor agitation. Skin: Warm and dry.  Assessment & Plan: [name] was seen today for right hip pain.  Diagnoses and associated orders for this visit:  Strain of buttock, right, initial encounter  Dental infection - amoxicillin-clavulanate (AUGMENTIN) 500-125 MG per tablet; Take one by mouth every 8 hours for ten total days. - fluconazole (DIFLUCAN) 150 MG tablet; Take one tab, may take second tab if no improvement after 72 hours.     strain of the buttock: Discussed that this should resolve on its own however whenever stretches that she can do to help expedite the healing process.   dental infection: Start Augmentin, provided with Diflucan since she is prone to getting vaginal yeast infections on this medication. She was also given information of a free clinic that helps remove  teeth and provides dentures, advised that she get this done as soon as possible.   Return if symptoms worsen or fail to improve.

## 2014-11-04 ENCOUNTER — Ambulatory Visit (INDEPENDENT_AMBULATORY_CARE_PROVIDER_SITE_OTHER): Payer: Medicare Other | Admitting: Family Medicine

## 2014-11-04 ENCOUNTER — Encounter: Payer: Self-pay | Admitting: Family Medicine

## 2014-11-04 ENCOUNTER — Ambulatory Visit: Payer: Medicare Other | Admitting: Family Medicine

## 2014-11-04 VITALS — BP 116/75 | HR 96 | Temp 98.8°F | Wt 185.0 lb

## 2014-11-04 DIAGNOSIS — B349 Viral infection, unspecified: Secondary | ICD-10-CM

## 2014-11-04 DIAGNOSIS — J329 Chronic sinusitis, unspecified: Secondary | ICD-10-CM

## 2014-11-04 DIAGNOSIS — B9789 Other viral agents as the cause of diseases classified elsewhere: Secondary | ICD-10-CM

## 2014-11-04 MED ORDER — PREDNISONE 20 MG PO TABS: ORAL_TABLET | ORAL | Status: AC

## 2014-11-04 NOTE — Progress Notes (Signed)
CC: Janet Richards is a 53 y.o. female is here for Nasal Congestion   Subjective: HPI:  Nasal congestion, decreased clarity in voice, postnasal drip and sinus pressure in the cheeks that has been present for 1 week. Symptoms have been persistent since onset and overall mild to moderate in severity. Symptoms began while she was taking Augmentin for a dental infection. She had chills on the weekend however this is resolved. Interventions have included doubling up on a multivitamin that she has at home. No other interventions other than this. She denies fevers, cough, shortness of breath, chest pain, confusion, motor or sensory disturbances, eye complaints, hearing loss or ear pain.   Review Of Systems Outlined In HPI  Past Medical History  Diagnosis Date  . Urticaria   . Essential hypertension, benign   . Primary osteoarthritis of both knees   . Exertional chest pain   . Menstrual migraine     Past Surgical History  Procedure Laterality Date  . Tubal ligation  12/26/1987   Family History  Problem Relation Age of Onset  . Hypertension Mother     History   Social History  . Marital Status: Married    Spouse Name: N/A    Number of Children: N/A  . Years of Education: N/A   Occupational History  . homemaker    Social History Main Topics  . Smoking status: Former Smoker -- 1.50 packs/day    Types: Cigarettes    Quit date: 02/05/2006  . Smokeless tobacco: Never Used  . Alcohol Use: Yes     Comment: rarely  . Drug Use: No  . Sexual Activity:    Partners: Male   Other Topics Concern  . Not on file   Social History Narrative     Objective: BP 116/75 mmHg  Pulse 96  Temp(Src) 98.8 F (37.1 C) (Oral)  Wt 185 lb (83.915 kg)  SpO2 99%  General: Alert and Oriented, No Acute Distress HEENT: Pupils equal, round, reactive to light. Conjunctivae clear.  External ears unremarkable, canals clear with intact TMs with appropriate landmarks.  Middle ear appears open without  effusion. Pink inferior turbinates.  Moist mucous membranes, pharynx without inflammation nor lesions.  Neck supple without palpable lymphadenopathy nor abnormal masses. Lungs: Clear to auscultation bilaterally, no wheezing/ronchi/rales.  Comfortable work of breathing. Good air movement. Extremities: No peripheral edema.  Strong peripheral pulses.  Mental Status: No depression, anxiety, nor agitation. Skin: Warm and dry.  Assessment & Plan: Janet Richards was seen today for nasal congestion.  Diagnoses and associated orders for this visit:  Viral sinusitis - predniSONE (DELTASONE) 20 MG tablet; Three tabs at once daily for five days.    Viral sinusitis: Reassurance provided that does not sound like there is any bacterial component to her infection especially since she was on Augmentin when the symptoms began. Her biggest complaint today is loss of her voice and she would like to know if there something that can help regain her voice as soon as possible therefore prednisone provided for likely vocal cord edema.  Return if symptoms worsen or fail to improve.

## 2014-11-12 ENCOUNTER — Telehealth: Payer: Self-pay | Admitting: *Deleted

## 2014-11-12 MED ORDER — CEFDINIR 300 MG PO CAPS: 300.0000 mg | ORAL_CAPSULE | Freq: Two times a day (BID) | ORAL | Status: AC

## 2014-11-12 NOTE — Telephone Encounter (Signed)
Pt left a message on my vm stating she has HA, bridge of nose hurts, facial pain. Pt was last seen on 12/29 for viral sinusitis. Pt is requesting abx called into her pharm. Please advise

## 2014-11-12 NOTE — Telephone Encounter (Signed)
Pt.notified

## 2014-11-12 NOTE — Telephone Encounter (Signed)
Cefdinir sent to [facility]

## 2014-11-26 ENCOUNTER — Ambulatory Visit (INDEPENDENT_AMBULATORY_CARE_PROVIDER_SITE_OTHER): Payer: 59

## 2014-11-26 ENCOUNTER — Ambulatory Visit (INDEPENDENT_AMBULATORY_CARE_PROVIDER_SITE_OTHER): Payer: 59 | Admitting: Family Medicine

## 2014-11-26 ENCOUNTER — Telehealth: Payer: Self-pay | Admitting: Family Medicine

## 2014-11-26 ENCOUNTER — Encounter: Payer: Self-pay | Admitting: Family Medicine

## 2014-11-26 VITALS — BP 152/80 | HR 91 | Temp 98.2°F | Wt 189.0 lb

## 2014-11-26 DIAGNOSIS — R05 Cough: Secondary | ICD-10-CM | POA: Diagnosis not present

## 2014-11-26 DIAGNOSIS — R0602 Shortness of breath: Secondary | ICD-10-CM | POA: Diagnosis not present

## 2014-11-26 MED ORDER — AZITHROMYCIN 250 MG PO TABS: ORAL_TABLET | ORAL | Status: AC

## 2014-11-26 NOTE — Telephone Encounter (Signed)
Pt.notified

## 2014-11-26 NOTE — Telephone Encounter (Signed)
Seth Bake, Will you please let patient know that her xray did not show signs of pneumonia, I suspect her SOB is due to bronchitis therefore I've sent a Zpack to her [facility].

## 2014-11-26 NOTE — Progress Notes (Signed)
CC: Janet Richards is a 54 y.o. female is here for bronchitis?   Subjective: HPI:  Complains of shortness of breath that occurred the day after she stopped Omnicef about a week ago. Symptoms have been mild but persistent on a daily basis. They're worse with activity or when talking fast. She's never had this before. She denies any symptoms when sitting at rest and not talking. Accompanied by a mild nonproductive cough and also pain in the lower lateral sides of her rib cage. No interventions as of yet. Nothing seems to make better or worse. She still fatigued but denies fevers, chills, no chest pain other than that described above. Denies facial pain, nasal congestion, ear pain, confusion, nor headache.   Review Of Systems Outlined In HPI  Past Medical History  Diagnosis Date  . Urticaria   . Essential hypertension, benign   . Primary osteoarthritis of both knees   . Exertional chest pain   . Menstrual migraine     Past Surgical History  Procedure Laterality Date  . Tubal ligation  12/26/1987   Family History  Problem Relation Age of Onset  . Hypertension Mother     History   Social History  . Marital Status: Married    Spouse Name: N/A    Number of Children: N/A  . Years of Education: N/A   Occupational History  . homemaker    Social History Main Topics  . Smoking status: Former Smoker -- 1.50 packs/day    Types: Cigarettes    Quit date: 02/05/2006  . Smokeless tobacco: Never Used  . Alcohol Use: Yes     Comment: rarely  . Drug Use: No  . Sexual Activity:    Partners: Male   Other Topics Concern  . Not on file   Social History Narrative     Objective: BP 152/80 mmHg  Pulse 91  Temp(Src) 98.2 F (36.8 C) (Oral)  Wt 189 lb (85.73 kg)  General: Alert and Oriented, No Acute Distress HEENT: Pupils equal, round, reactive to light. Conjunctivae clear.  External ears unremarkable, canals clear with intact TMs with appropriate landmarks.  Middle ear appears  open without effusion. Pink inferior turbinates.  Moist mucous membranes, pharynx without inflammation nor lesions.  Neck supple without palpable lymphadenopathy nor abnormal masses. Lungs: Clear to auscultation bilaterally, no wheezing/ronchi/rales.  Comfortable work of breathing. Good air movement. Cardiac: Regular rate and rhythm. Normal S1/S2.  No murmurs, rubs, nor gallops.   Extremities: No peripheral edema.  Strong peripheral pulses.  Mental Status: No depression, anxiety, nor agitation. Skin: Warm and dry.  Assessment & Plan: Janet Richards was seen today for bronchitis?.  Diagnoses and associated orders for this visit:  Shortness of breath - DG Chest 2 View; Future    Shortness of breath: Bronchitis is on the top of my differential, she is extremely worried that she could have pneumonia. We will obtain an x-ray today to help further confirm whether or not she has pneumonia.  She will be notified once x-ray results are available for guidance on further recommendations.  Return if symptoms worsen or fail to improve.

## 2014-12-02 ENCOUNTER — Ambulatory Visit: Payer: 59 | Admitting: Family Medicine

## 2014-12-03 ENCOUNTER — Telehealth: Payer: Self-pay

## 2014-12-03 NOTE — Telephone Encounter (Signed)
Patient called and left a message asking for Zyrtec in a solution.

## 2014-12-11 ENCOUNTER — Ambulatory Visit (INDEPENDENT_AMBULATORY_CARE_PROVIDER_SITE_OTHER): Payer: 59 | Admitting: Family Medicine

## 2014-12-11 ENCOUNTER — Encounter: Payer: Self-pay | Admitting: Family Medicine

## 2014-12-11 VITALS — BP 138/74 | HR 95 | Wt 189.0 lb

## 2014-12-11 DIAGNOSIS — K047 Periapical abscess without sinus: Secondary | ICD-10-CM

## 2014-12-11 DIAGNOSIS — R0981 Nasal congestion: Secondary | ICD-10-CM | POA: Diagnosis not present

## 2014-12-11 DIAGNOSIS — J301 Allergic rhinitis due to pollen: Secondary | ICD-10-CM | POA: Diagnosis not present

## 2014-12-11 MED ORDER — METRONIDAZOLE 500 MG PO TABS
ORAL_TABLET | ORAL | Status: AC
Start: 2014-12-11 — End: 2014-12-18

## 2014-12-11 MED ORDER — CETIRIZINE HCL 5 MG/5ML PO SYRP: 10.0000 mg | ORAL_SOLUTION | Freq: Every day | ORAL | Status: DC

## 2014-12-11 NOTE — Progress Notes (Signed)
CC: Janet Richards is a 54 y.o. female is here for Nasal Congestion   Subjective: HPI:  Ria Comment nasal congestion that has been worsening over the past week present on a daily basis. No benefit from Singulair and Astelin on a daily basis. Seems to be worsening on daily basis. Described as difficultiy breathing out of either nostril, constant dripping from the nasal passages, clear nasal discharge. Occasional sneezing. Denies facial pressure, sore throat, fevers, chills, fatigue or headache.  Complains of dental pain in the superiormost posterior most left molar. She has plans to have this removed on the eighth of this month at the [facility]. It is painful with any food or liquid on that side of the mouth. Symptoms have been present for the past 2 or 3 days and seem to be worsening now moderate in severity. No benefit from over-the-counter pain medication. Denies swollen lymph nodes, facial swelling, nausea or vomiting.   Review Of Systems Outlined In HPI  Past Medical History  Diagnosis Date  . Urticaria   . Essential hypertension, benign   . Primary osteoarthritis of both knees   . Exertional chest pain   . Menstrual migraine     Past Surgical History  Procedure Laterality Date  . Tubal ligation  12/26/1987   Family History  Problem Relation Age of Onset  . Hypertension Mother     History   Social History  . Marital Status: Married    Spouse Name: N/A    Number of Children: N/A  . Years of Education: N/A   Occupational History  . homemaker    Social History Main Topics  . Smoking status: Former Smoker -- 1.50 packs/day    Types: Cigarettes    Quit date: 02/05/2006  . Smokeless tobacco: Never Used  . Alcohol Use: Yes     Comment: rarely  . Drug Use: No  . Sexual Activity:    Partners: Male   Other Topics Concern  . Not on file   Social History Narrative     Objective: BP 138/74 mmHg  Pulse 95  Wt 189 lb (85.73 kg)  General: Alert and Oriented, No Acute  Distress HEENT: Pupils equal, round, reactive to light. Conjunctivae clear.  External ears unremarkable, canals clear with intact TMs with appropriate landmarks.  Middle ear appears open without effusion. Pale blue inferior turbinates with mild clear nasal discharge..  Moist mucous membranes, pharynx without inflammation nor lesions.  Neck supple without palpable lymphadenopathy nor abnormal masses. The superiormost posterior most left molar is missing the posterior aspect of the tooth and there is considerable dental decay on this exposed face. Tender to touch. Lungs: Clear to auscultation bilaterally, no wheezing/ronchi/rales.  Comfortable work of breathing. Good air movement. Extremities: No peripheral edema.  Strong peripheral pulses.  Mental Status: No depression, anxiety, nor agitation. Skin: Warm and dry.  Assessment & Plan: [name] was seen today for nasal congestion.  Diagnoses and associated orders for this visit:  Allergic rhinitis due to pollen  Nasal congestion - cetirizine HCl (ZYRTEC CHILDRENS ALLERGY) 5 MG/5ML SYRP; Take 10 mLs (10 mg total) by mouth daily.  Dental infection - metroNIDAZOLE (FLAGYL) 500 MG tablet; One tab every 12 hours for seven days.    Nasal congestion: She is not interested in trying any nasal steroid sprays, she will begin Zyrtec along with Singulair.She's requesting a liquid form specifically. Dental infection: Metronidazole until she can get in with dentistry on the eighth.   Return if symptoms worsen or fail  to improve.

## 2014-12-24 ENCOUNTER — Telehealth: Payer: Self-pay | Admitting: *Deleted

## 2014-12-24 MED ORDER — AMOXICILLIN 500 MG PO CAPS: 500.0000 mg | ORAL_CAPSULE | Freq: Three times a day (TID) | ORAL | Status: DC

## 2014-12-24 NOTE — Telephone Encounter (Signed)
[facility].

## 2014-12-24 NOTE — Telephone Encounter (Signed)
Pt requests abx for dental infection. She states her dentist appt got canceled because of bad weather

## 2014-12-30 ENCOUNTER — Telehealth: Payer: Self-pay | Admitting: *Deleted

## 2014-12-30 MED ORDER — FLUCONAZOLE 150 MG PO TABS: 150.0000 mg | ORAL_TABLET | Freq: Once | ORAL | Status: DC

## 2014-12-30 NOTE — Telephone Encounter (Signed)
Pt is requesting medication for a yeast infection. Pt was recently rx'ed an abx

## 2015-01-08 ENCOUNTER — Telehealth: Payer: Self-pay | Admitting: *Deleted

## 2015-01-08 MED ORDER — METRONIDAZOLE 500 MG PO TABS: ORAL_TABLET | ORAL | Status: DC

## 2015-01-08 NOTE — Telephone Encounter (Signed)
Rx sent 

## 2015-01-08 NOTE — Telephone Encounter (Signed)
Pt called and wants an abx for her gums. She states she found a dentist and will try to get an appointment

## 2015-01-14 ENCOUNTER — Ambulatory Visit (INDEPENDENT_AMBULATORY_CARE_PROVIDER_SITE_OTHER): Payer: 59 | Admitting: Physician Assistant

## 2015-01-14 ENCOUNTER — Encounter: Payer: Self-pay | Admitting: Physician Assistant

## 2015-01-14 VITALS — BP 144/88 | HR 96 | Ht 63.0 in | Wt 190.0 lb

## 2015-01-14 DIAGNOSIS — J3089 Other allergic rhinitis: Secondary | ICD-10-CM

## 2015-01-14 DIAGNOSIS — R51 Headache: Secondary | ICD-10-CM

## 2015-01-14 DIAGNOSIS — K088 Other specified disorders of teeth and supporting structures: Secondary | ICD-10-CM

## 2015-01-14 DIAGNOSIS — K0889 Other specified disorders of teeth and supporting structures: Secondary | ICD-10-CM

## 2015-01-14 DIAGNOSIS — J309 Allergic rhinitis, unspecified: Secondary | ICD-10-CM | POA: Diagnosis not present

## 2015-01-14 DIAGNOSIS — R519 Headache, unspecified: Secondary | ICD-10-CM | POA: Insufficient documentation

## 2015-01-14 HISTORY — DX: Other allergic rhinitis: J30.89

## 2015-01-14 NOTE — Progress Notes (Signed)
   Subjective:    Patient ID: Janet Richards, female    DOB: [birth year], 54 y.o.   MRN: [id number]  HPI  Pt presents to the clinic with for approximately 5-6 days of bilateral ear pain and sinus pressure. Pressure seems to be more off and on and goes from side to side. Nasal congestion and drainage. Off and on ST. Using astelin off and on. Taking singulair and zyrtec daily. No fever, chills, cough, SOB, or wheezing.    She is still having dental pain. She was never able to go to [facility] clinic due to bad weather and would not rescheduled until July. She is getting new insurance that will pay for dental after April 1st.     Review of Systems  All other systems reviewed and are negative.      Objective:   Physical Exam  Constitutional: She is oriented to person, place, and time. She appears well-developed and well-nourished.  HENT:  Head: Normocephalic and atraumatic.  Right Ear: External ear normal.  Left Ear: External ear normal.  Mouth/Throat: Oropharynx is clear and moist. No oropharyngeal exudate.  TM's clear bilaterally.   Bilateral nasal turbinates red and swollen with rhinorrhea.   Poor dentition. Upper molar broken tooth. No abscess. Gums erythematous.   Eyes:  Dark circles around bilateral eyes. Watery discharge.   Neck: Normal range of motion. Neck supple.  Cardiovascular: Normal rate, regular rhythm and normal heart sounds.   Pulmonary/Chest: Effort normal and breath sounds normal. She has no wheezes.  Lymphadenopathy:    She has no cervical adenopathy.  Neurological: She is alert and oriented to person, place, and time.  Skin: Skin is dry.  Psychiatric: She has a normal mood and affect. Her behavior is normal.          Assessment & Plan:  Chronic allergic rhinitis/sinusitis- I do not think bacterial in origin. Suggested prednisone but pt declined stated "is making her fat". Continue with astelin, singulair, zyrtec. She wants to avoid nasal steroids. Try OTC beta  glucan 500mg  bid with local honey. Add warm salt water nasal saline rinses as well.  Will make referral to allergist.   Dental pain- she has a broken tooth and irritated gums but no active infection. Teeth need to be pulled. Tramadol as needed for breakthrough pain.

## 2015-01-14 NOTE — Patient Instructions (Addendum)
Try Beta Glucan 500mg  twice a day.  Local honey.  Will refer to allergist.

## 2015-01-15 ENCOUNTER — Telehealth: Payer: Self-pay | Admitting: *Deleted

## 2015-01-15 DIAGNOSIS — J309 Allergic rhinitis, unspecified: Secondary | ICD-10-CM

## 2015-01-15 NOTE — Telephone Encounter (Signed)
Pt is already a pt with [facility] with Dr. Latricia Heft. Santiago Glad from their office states that pt will need to stay with Dr. Latricia Heft instead Of Dr. Heber [place]. ( I assume this Dr.'s name may have been on the referral.) She wanted to know if she needed to be retested or a revisit. It had been less than three years since she was tested. I told them just a revisit. They will need a new referral with Dr. Latricia Heft name on it

## 2015-01-23 ENCOUNTER — Encounter: Payer: Self-pay | Admitting: Family Medicine

## 2015-01-23 ENCOUNTER — Ambulatory Visit (INDEPENDENT_AMBULATORY_CARE_PROVIDER_SITE_OTHER): Payer: 59 | Admitting: Family Medicine

## 2015-01-23 VITALS — BP 117/64 | HR 100 | Wt 192.0 lb

## 2015-01-23 DIAGNOSIS — K088 Other specified disorders of teeth and supporting structures: Secondary | ICD-10-CM | POA: Diagnosis not present

## 2015-01-23 DIAGNOSIS — K0889 Other specified disorders of teeth and supporting structures: Secondary | ICD-10-CM

## 2015-01-23 DIAGNOSIS — B3731 Acute candidiasis of vulva and vagina: Secondary | ICD-10-CM

## 2015-01-23 DIAGNOSIS — B373 Candidiasis of vulva and vagina: Secondary | ICD-10-CM | POA: Diagnosis not present

## 2015-01-23 MED ORDER — AMOXICILLIN 500 MG PO CAPS: 500.0000 mg | ORAL_CAPSULE | Freq: Three times a day (TID) | ORAL | Status: DC

## 2015-01-23 MED ORDER — FLUCONAZOLE 150 MG PO TABS: ORAL_TABLET | ORAL | Status: AC

## 2015-01-23 NOTE — Progress Notes (Signed)
CC: Janet Richards is a 54 y.o. female is here for f/u sinuses   Subjective: HPI:  Complains of left upper posterior molar discomfort that radiates into the gums. Accompanied by fevers and chills over the past 2 days. Symptoms are worse when eating or chewing. Similar symptoms have improved with antibiotics in the past. Currently no other interventions other than avoiding food and eating. Symptoms are mild to moderate in severity. She has dental insurance that starts on April 1 and she plans to see a dentist soon after this is active. Denies cough, shortness of breath, wheezing, sore throat, nor swollen lymph nodes   Review Of Systems Outlined In HPI  Past Medical History  Diagnosis Date  . Urticaria   . Essential hypertension, benign   . Primary osteoarthritis of both knees   . Exertional chest pain   . Menstrual migraine     Past Surgical History  Procedure Laterality Date  . Tubal ligation  12/26/1987   Family History  Problem Relation Age of Onset  . Hypertension Mother     History   Social History  . Marital Status: Married    Spouse Name: N/A  . Number of Children: N/A  . Years of Education: N/A   Occupational History  . homemaker    Social History Main Topics  . Smoking status: Former Smoker -- 1.50 packs/day    Types: Cigarettes    Quit date: 02/05/2006  . Smokeless tobacco: Never Used  . Alcohol Use: Yes     Comment: rarely  . Drug Use: No  . Sexual Activity:    Partners: Male   Other Topics Concern  . Not on file   Social History Narrative     Objective: BP 117/64 mmHg  Pulse 100  Wt 192 lb (87.091 kg)  General: Alert and Oriented, No Acute Distress HEENT: Pupils equal, round, reactive to light. Conjunctivae clear.  Moist mucous membranes area poor dentition. Severely decayed left upper posterior molar with tenderness to palpation and surrounding gumline. No discharge around the gumline. No palpable lymphadenopathy in the cervical  region Lungs: clear andcomfortable work of breathing Cardiac: Regular rate and rhythm.  Skin: Warm and dry.  Assessment & Plan: Janet Richards was seen today for f/u sinuses.  Diagnoses and all orders for this visit:  Pain, dental Orders: -     amoxicillin (AMOXIL) 500 MG capsule; Take 1 capsule (500 mg total) by mouth 3 (three) times daily.  Yeast vaginitis Orders: -     fluconazole (DIFLUCAN) 150 MG tablet; Take one tab, may take second tab if no improvement after 72 hours.   Dental  Pain concerning for infection. Provided with amoxicillin. Again I've informed her that she needs to have this tooth removed as soon as possible follow-up with dentistry as soon as possible.  No Follow-up on file.

## 2015-02-02 ENCOUNTER — Ambulatory Visit (INDEPENDENT_AMBULATORY_CARE_PROVIDER_SITE_OTHER): Payer: 59 | Admitting: Family Medicine

## 2015-02-02 ENCOUNTER — Encounter: Payer: Self-pay | Admitting: Family Medicine

## 2015-02-02 VITALS — BP 157/91 | HR 95 | Wt 194.0 lb

## 2015-02-02 DIAGNOSIS — R1013 Epigastric pain: Secondary | ICD-10-CM | POA: Diagnosis not present

## 2015-02-02 LAB — CBC
HCT: 38.8 % (ref 36.0–46.0)
Hemoglobin: 12.5 g/dL (ref 12.0–15.0)
MCH: 28.3 pg (ref 26.0–34.0)
MCHC: 32.2 g/dL (ref 30.0–36.0)
MCV: 88 fL (ref 78.0–100.0)
MPV: 10.2 fL (ref 8.6–12.4)
Platelets: 344 10*3/uL (ref 150–400)
RBC: 4.41 MIL/uL (ref 3.87–5.11)
RDW: 14.5 % (ref 11.5–15.5)
WBC: 6.8 10*3/uL (ref 4.0–10.5)

## 2015-02-02 NOTE — Progress Notes (Signed)
CC: Janet Richards is a 54 y.o. female is here for check for a bleeding ulcer   Subjective: HPI:   epigastric discomfort described only has mild pain that is nonradiating. It's worse when bending forward.  She is uncertain how long it's been present but it's been here for at least the last week. Nothing else seems to make it better. She had a family member recently had a bleeding ulcer and she is fearful that this is what her pain is from. Pain is present on a daily basis. Pain is described only has pain.she denies melena states that she has seen blood in her stool but she forgets what it was last. Denies fevers, chills, unintentional weight loss or gain. No nausea vomiting or shortness of breath. Denies rapid heartbeat or chest pain.   Review Of Systems Outlined In HPI  Past Medical History  Diagnosis Date  . Urticaria   . Essential hypertension, benign   . Primary osteoarthritis of both knees   . Exertional chest pain   . Menstrual migraine     Past Surgical History  Procedure Laterality Date  . Tubal ligation  12/26/1987   Family History  Problem Relation Age of Onset  . Hypertension Mother     History   Social History  . Marital Status: Married    Spouse Name: N/A  . Number of Children: N/A  . Years of Education: N/A   Occupational History  . homemaker    Social History Main Topics  . Smoking status: Former Smoker -- 1.50 packs/day    Types: Cigarettes    Quit date: 02/05/2006  . Smokeless tobacco: Never Used  . Alcohol Use: Yes     Comment: rarely  . Drug Use: No  . Sexual Activity:    Partners: Male   Other Topics Concern  . Not on file   Social History Narrative     Objective: BP 157/91 mmHg  Pulse 95  Wt 194 lb (87.998 kg)  General: Alert and Oriented, No Acute Distress HEENT: Pupils equal, round, reactive to light. Conjunctivae clear.  Moist mucous membranes pharynx unremarkable Lungs:clear and comfortable work of breathing Cardiac: Regular  rate and rhythm.  Abdomen: Normal bowel sounds, soft without palpable abnormalities or masses. No palpable hepatosplenomegaly. Pain is reproduced with palpation of the epigastric region but there is no rebound tenderness or guarding. Extremities: No peripheral edema.  Strong peripheral pulses.  Mental Status: No depression, anxiety, nor agitation.requires frequent redirection Skin: Warm and dry.  Assessment & Plan: Keylen was seen today for check for a bleeding ulcer.  Diagnoses and all orders for this visit:  Epigastric pain Orders: -     CBC   Epigastric discomfort: She is extremely concerned that this is a bleeding ulcer that's causing her pain, to help calm her fears checking CBC for hemoglobin count and provided with stool cards to look for blood in the stool. Results are normal will focus on gastritis with ranitidine  25 minutes spent face-to-face during visit today of which at least 50% was counseling or coordinating care regarding: 1. Epigastric pain      Return if symptoms worsen or fail to improve.

## 2015-02-05 ENCOUNTER — Telehealth: Payer: Self-pay | Admitting: *Deleted

## 2015-02-05 NOTE — Telephone Encounter (Signed)
Can she be more specific as to where she's bleeding from?  Also did she completely stop taking megestrol tablets?

## 2015-02-05 NOTE — Telephone Encounter (Signed)
Patient has stopped completely taking megestrol tablets.  Allergist appt is 4/4. Patient says she's not bleeding now but she's getting ready to because everytime she stops  taking the medication she begins to bleed. Patient stated that she bleeds from the same place where you pee.

## 2015-02-05 NOTE — Telephone Encounter (Signed)
Patient called in stating that she is bleeding again after taking the zyrtec & antacid prescribed. She is wondering if she needs a ultra sound on mri.

## 2015-02-06 NOTE — Telephone Encounter (Signed)
Spoke with patient if bleeding starts she needs to call Dr Gala Romney or any provider [facility]

## 2015-02-06 NOTE — Telephone Encounter (Signed)
I would recommend she follow up with Dr. Gala Romney or any of the other providers in the [facility] clinic, phone number is [phone].

## 2015-02-09 DIAGNOSIS — H1045 Other chronic allergic conjunctivitis: Secondary | ICD-10-CM | POA: Diagnosis not present

## 2015-02-09 DIAGNOSIS — L509 Urticaria, unspecified: Secondary | ICD-10-CM | POA: Diagnosis not present

## 2015-02-09 DIAGNOSIS — J310 Chronic rhinitis: Secondary | ICD-10-CM | POA: Diagnosis not present

## 2015-02-11 ENCOUNTER — Encounter: Payer: 59 | Admitting: Family Medicine

## 2015-02-18 ENCOUNTER — Ambulatory Visit (INDEPENDENT_AMBULATORY_CARE_PROVIDER_SITE_OTHER): Payer: 59 | Admitting: Family Medicine

## 2015-02-18 ENCOUNTER — Encounter: Payer: Self-pay | Admitting: Family Medicine

## 2015-02-18 DIAGNOSIS — M7061 Trochanteric bursitis, right hip: Secondary | ICD-10-CM | POA: Diagnosis not present

## 2015-02-18 DIAGNOSIS — M7062 Trochanteric bursitis, left hip: Secondary | ICD-10-CM | POA: Diagnosis not present

## 2015-02-18 MED ORDER — TRAMADOL HCL 50 MG PO TABS: 50.0000 mg | ORAL_TABLET | Freq: Three times a day (TID) | ORAL | Status: DC | PRN

## 2015-02-18 NOTE — Progress Notes (Signed)
CC: Janet Richards is a 54 y.o. female is here for Hip Pain   Subjective: HPI:  Bilateral hip pain that has been present for the past 2 weeks on a daily basis. It's absent when sitting or lying down noticeable only when up walking around or standing. Localized on the lateral aspects of the hip radiating distally down the lateral aspects of the thigh. Pain is described as sharp in pain, moderate in severity. Other than above nothing particularly makes it better or worse to her been no interventions as of yet. She tells me she's never had this pain before. She denies any groin pain nor buttock pain. No abdominal pain, gastrointestinal complaints or genitourinary complaints. She's had some back pain however this is chronic in nature and has not changed in severity or character over the past few years. She denies any weakness in the lower extremities nor any motor or sensory disturbances other than pain.    Review Of Systems Outlined In HPI  Past Medical History  Diagnosis Date  . Urticaria   . Essential hypertension, benign   . Primary osteoarthritis of both knees   . Exertional chest pain   . Menstrual migraine     Past Surgical History  Procedure Laterality Date  . Tubal ligation  12/26/1987   Family History  Problem Relation Age of Onset  . Hypertension Mother     History   Social History  . Marital Status: Married    Spouse Name: N/A  . Number of Children: N/A  . Years of Education: N/A   Occupational History  . homemaker    Social History Main Topics  . Smoking status: Former Smoker -- 1.50 packs/day    Types: Cigarettes    Quit date: 02/05/2006  . Smokeless tobacco: Never Used  . Alcohol Use: Yes     Comment: rarely  . Drug Use: No  . Sexual Activity:    Partners: Male   Other Topics Concern  . Not on file   Social History Narrative     Objective: BP 106/64 mmHg  Pulse 98  Wt 194 lb (87.998 kg)  Vital signs reviewed. General: Alert and Oriented, No  Acute Distress HEENT: Pupils equal, round, reactive to light. Conjunctivae clear.  External ears unremarkable.  Moist mucous membranes. Lungs: Clear and comfortable work of breathing, speaking in full sentences without accessory muscle use. Cardiac: Regular rate and rhythm.  Neuro: CN II-XII grossly intact, gait normal. Extremities: No peripheral edema.  Strong peripheral pulses. Full range of motion and strength of both lower extremity. Pain is 100% reproduced with palpation of the greater trochanter on the left and the right. No groin pain with internal or external rotation of the femur. Normal gait Mental Status: No depression, anxiety, nor agitation. Logical though process. Skin: Warm and dry.  Assessment & Plan: Myles was seen today for hip pain.  Diagnoses and all orders for this visit:  Trochanteric bursitis of both hips Orders: -     traMADol (ULTRAM) 50 MG tablet; Take 1 tablet (50 mg total) by mouth every 8 (eight) hours as needed.   Patient is intolerant to all nonsteroidal anti-inflammatories. She will begin tramadol to mask the pain and she was also encouraged to start home rehabilitative exercises to help stretch the IT band and surrounding muscles. She was given a handout on these exercises and stretches to begin performing daily for at least the next 2 weeks.  She has a dental appointment later this month and  I told her that it's imperative that she keeps this appointment so as to start the process of having some if not all of her teeth removed.  25 minutes spent face-to-face during visit today of which at least 50% was counseling or coordinating care regarding: 1. Trochanteric bursitis of both hips      Return if symptoms worsen or fail to improve.

## 2015-02-19 ENCOUNTER — Ambulatory Visit (INDEPENDENT_AMBULATORY_CARE_PROVIDER_SITE_OTHER): Payer: 59 | Admitting: Family Medicine

## 2015-02-19 ENCOUNTER — Encounter: Payer: Self-pay | Admitting: Family Medicine

## 2015-02-19 VITALS — BP 117/67 | HR 90 | Ht 63.0 in | Wt 194.0 lb

## 2015-02-19 DIAGNOSIS — E785 Hyperlipidemia, unspecified: Secondary | ICD-10-CM | POA: Diagnosis not present

## 2015-02-19 DIAGNOSIS — Z0000 Encounter for general adult medical examination without abnormal findings: Secondary | ICD-10-CM | POA: Diagnosis not present

## 2015-02-19 DIAGNOSIS — K047 Periapical abscess without sinus: Secondary | ICD-10-CM | POA: Diagnosis not present

## 2015-02-19 DIAGNOSIS — Z1239 Encounter for other screening for malignant neoplasm of breast: Secondary | ICD-10-CM

## 2015-02-19 DIAGNOSIS — Z1211 Encounter for screening for malignant neoplasm of colon: Secondary | ICD-10-CM

## 2015-02-19 NOTE — Progress Notes (Signed)
CC: Janet Richards is a 54 y.o. female is here for Annual Exam   Subjective: HPI:  Colonoscopy: Has never had a colonoscopy, orders have been placed today Papsmear: up-to-date Pap smear from 2014 normal repeat 2017 Mammogram: normal in September 2015 repeat September this year  Influenza Vaccine: out of season Pneumovax: no current indication Td/Tdap: up-to-date Zoster: (Start 52 yo)  Review of Systems - General ROS: negative for - chills, fever, night sweats, weight gain or weight loss Ophthalmic ROS: negative for - decreased vision Psychological ROS: negative for - anxiety or depression ENT ROS: negative for - hearing change, nasal congestion, tinnitus or allergies Hematological and Lymphatic ROS: negative for - bleeding problems, bruising or swollen lymph nodes Breast ROS: negative Respiratory ROS: no cough, shortness of breath, or wheezing Cardiovascular ROS: no chest pain or dyspnea on exertion Gastrointestinal ROS: no abdominal pain, change in bowel habits, or black or bloody stools Genito-Urinary ROS: negative for - genital discharge, genital ulcers, incontinence or abnormal bleeding from genitals Musculoskeletal ROS: negative for - joint pain or muscle pain Neurological ROS: negative for - headaches or memory loss Dermatological ROS: negative for lumps, mole changes, rash and skin lesion changes  Past Medical History  Diagnosis Date  . Urticaria   . Essential hypertension, benign   . Primary osteoarthritis of both knees   . Exertional chest pain   . Menstrual migraine     Past Surgical History  Procedure Laterality Date  . Tubal ligation  12/26/1987   Family History  Problem Relation Age of Onset  . Hypertension Mother     History   Social History  . Marital Status: Married    Spouse Name: N/A  . Number of Children: N/A  . Years of Education: N/A   Occupational History  . homemaker    Social History Main Topics  . Smoking status: Former Smoker --  1.50 packs/day    Types: Cigarettes    Quit date: 02/05/2006  . Smokeless tobacco: Never Used  . Alcohol Use: Yes     Comment: rarely  . Drug Use: No  . Sexual Activity:    Partners: Male   Other Topics Concern  . Not on file   Social History Narrative     Objective: BP 117/67 mmHg  Pulse 90  Ht 5\' 3"  (1.6 m)  Wt 194 lb (87.998 kg)  BMI 34.37 kg/m2  General: No Acute Distress HEENT: Atraumatic, normocephalic, conjunctivae normal without scleral icterus.  No nasal discharge, hearing grossly intact, TMs with good landmarks bilaterally with no middle ear abnormalities, posterior pharynx clear without oral lesions. Neck: Supple, trachea midline, no cervical nor supraclavicular adenopathy. Pulmonary: Clear to auscultation bilaterally without wheezing, rhonchi, nor rales. Cardiac: Regular rate and rhythm.  No murmurs, rubs, nor gallops. No peripheral edema.  2+ peripheral pulses bilaterally. Abdomen: Bowel sounds normal.  No masses.  Non-tender without rebound.  Negative Murphy's sign. [id number] MSK: Grossly intact, no signs of weakness.  Full strength throughout upper and lower extremities.  Full ROM in upper and lower extremities.  No midline spinal tenderness. Neuro: Gait unremarkable, CN II-XII grossly intact.  C5-C6 Reflex 2/4 Bilaterally, L4 Reflex 2/4 Bilaterally.  Cerebellar function intact. Skin: No rashes. Psych: Alert and oriented to person/place/time.  Thought process normal. No anxiety/depression.   Assessment & Plan: Janet Richards was seen today for annual exam.  Diagnoses and all orders for this visit:  Annual physical exam Orders: -     Lipid panel -  COMPLETE METABOLIC PANEL WITH GFR -     CBC -     MM DIGITAL SCREENING BILATERAL; Future  Special screening for malignant neoplasms, colon Orders: -     Ambulatory referral to Gastroenterology  Screening for malignant neoplasm of breast Orders: -     MM DIGITAL SCREENING BILATERAL; Future  Healthy  lifestyle interventions including but not limited to regular exercise, a healthy low fat diet, moderation of salt intake, the dangers of tobacco/alcohol/recreational drug use, nutrition supplementation, and accident avoidance were discussed with the patient and a handout was provided for future reference.  Return if symptoms worsen or fail to improve.

## 2015-02-20 ENCOUNTER — Encounter: Payer: 59 | Admitting: Family Medicine

## 2015-02-20 ENCOUNTER — Encounter: Payer: Self-pay | Admitting: Family Medicine

## 2015-02-20 DIAGNOSIS — E785 Hyperlipidemia, unspecified: Secondary | ICD-10-CM | POA: Insufficient documentation

## 2015-02-20 HISTORY — DX: Hyperlipidemia, unspecified: E78.5

## 2015-02-20 LAB — CBC
HCT: 41.5 % (ref 36.0–46.0)
Hemoglobin: 13.7 g/dL (ref 12.0–15.0)
MCH: 28.9 pg (ref 26.0–34.0)
MCHC: 33 g/dL (ref 30.0–36.0)
MCV: 87.6 fL (ref 78.0–100.0)
MPV: 9.6 fL (ref 8.6–12.4)
Platelets: 392 10*3/uL (ref 150–400)
RBC: 4.74 MIL/uL (ref 3.87–5.11)
RDW: 14.5 % (ref 11.5–15.5)
WBC: 5.9 10*3/uL (ref 4.0–10.5)

## 2015-02-20 LAB — LIPID PANEL
Cholesterol: 204 mg/dL — ABNORMAL HIGH (ref 0–200)
HDL: 44 mg/dL — ABNORMAL LOW (ref >=46)
LDL Cholesterol: 121 mg/dL — ABNORMAL HIGH (ref 0–99)
Total CHOL/HDL Ratio: 4.6 Ratio
Triglycerides: 197 mg/dL — ABNORMAL HIGH (ref <150)
VLDL: 39 mg/dL (ref 0–40)

## 2015-02-20 LAB — COMPLETE METABOLIC PANEL WITH GFR
ALT: 21 U/L (ref 0–35)
AST: 20 U/L (ref 0–37)
Albumin: 4.2 g/dL (ref 3.5–5.2)
Alkaline Phosphatase: 77 U/L (ref 39–117)
BUN: 12 mg/dL (ref 6–23)
CO2: 27 mEq/L (ref 19–32)
Calcium: 9.6 mg/dL (ref 8.4–10.5)
Chloride: 97 mEq/L (ref 96–112)
Creat: 0.91 mg/dL (ref 0.50–1.10)
GFR, Est African American: 84 mL/min
GFR, Est Non African American: 73 mL/min
Glucose, Bld: 86 mg/dL (ref 70–99)
Potassium: 3.7 mEq/L (ref 3.5–5.3)
Sodium: 137 mEq/L (ref 135–145)
Total Bilirubin: 0.6 mg/dL (ref 0.2–1.2)
Total Protein: 7.2 g/dL (ref 6.0–8.3)

## 2015-02-25 ENCOUNTER — Telehealth: Payer: Self-pay | Admitting: *Deleted

## 2015-02-25 MED ORDER — AMOXICILLIN-POT CLAVULANATE 500-125 MG PO TABS: ORAL_TABLET | ORAL | Status: AC

## 2015-02-25 NOTE — Telephone Encounter (Signed)
Pt requests an abx for her gums. She has an appt with the dentist 4-27

## 2015-02-25 NOTE — Telephone Encounter (Signed)
Rx sent to [facility]

## 2015-03-08 ENCOUNTER — Telehealth: Payer: Self-pay | Admitting: Family Medicine

## 2015-03-09 ENCOUNTER — Encounter: Payer: Self-pay | Admitting: Family Medicine

## 2015-03-09 ENCOUNTER — Ambulatory Visit (INDEPENDENT_AMBULATORY_CARE_PROVIDER_SITE_OTHER): Payer: 59 | Admitting: Family Medicine

## 2015-03-09 VITALS — BP 128/72 | HR 86 | Wt 190.0 lb

## 2015-03-09 DIAGNOSIS — K047 Periapical abscess without sinus: Secondary | ICD-10-CM

## 2015-03-09 DIAGNOSIS — R0981 Nasal congestion: Secondary | ICD-10-CM

## 2015-03-09 MED ORDER — CETIRIZINE HCL 5 MG/5ML PO SYRP: 10.0000 mg | ORAL_SOLUTION | Freq: Every day | ORAL | Status: DC

## 2015-03-09 MED ORDER — METRONIDAZOLE 500 MG PO TABS: 500.0000 mg | ORAL_TABLET | Freq: Three times a day (TID) | ORAL | Status: DC

## 2015-03-09 NOTE — Progress Notes (Signed)
CC: Janet Richards is a 54 y.o. female is here for Follow-up   Subjective: HPI:  Fatigue, fever, chills that began a few days before the 20th of this month. She began taking Augmentin that was called in and within 2 days started to feel much better however she feels like she is not back to her baseline. Symptoms are persistent mild in severity but fluctuating somewhat throughout the day. She visited with her dentist last week and it took some x-rays but there's been no other interventions as of yet and she has follow-up tomorrow. Nothing seems to make symptoms better or worse other than the antibiotic improvement last week she feels that she's had a plateau since stopping the antibiotic. Denies dysphagia, shortness of breath, cough, dysuria, gastrointestinal complaints or skin concerns  Review Of Systems Outlined In HPI  Past Medical History  Diagnosis Date  . Urticaria   . Essential hypertension, benign   . Primary osteoarthritis of both knees   . Exertional chest pain   . Menstrual migraine     Past Surgical History  Procedure Laterality Date  . Tubal ligation  12/26/1987   Family History  Problem Relation Age of Onset  . Hypertension Mother     History   Social History  . Marital Status: Married    Spouse Name: N/A  . Number of Children: N/A  . Years of Education: N/A   Occupational History  . homemaker    Social History Main Topics  . Smoking status: Former Smoker -- 1.50 packs/day    Types: Cigarettes    Quit date: 02/05/2006  . Smokeless tobacco: Never Used  . Alcohol Use: Yes     Comment: rarely  . Drug Use: No  . Sexual Activity:    Partners: Male   Other Topics Concern  . Not on file   Social History Narrative     Objective: BP 128/72 mmHg  Pulse 86  Wt 190 lb (86.183 kg)  General: Alert and Oriented, No Acute Distress HEENT: Pupils equal, round, reactive to light. Conjunctivae clear.  Voice mucous membranes pharynx unremarkable. Tender superior  posterior most molar on the left with extensive dental decay Lungs: Clear comfortable work of breathing Cardiac: Regular rate and rhythm.  Extremities: No peripheral edema.  Strong peripheral pulses.  Mental Status: No depression, anxiety, nor agitation. Skin: Warm and dry.  Assessment & Plan: Janet Richards was seen today for follow-up.  Diagnoses and all orders for this visit:  Dental infection Orders: -     metroNIDAZOLE (FLAGYL) 500 MG tablet; Take 1 tablet (500 mg total) by mouth 3 (three) times daily.  Nasal congestion Orders: -     cetirizine HCl (ZYRTEC CHILDRENS ALLERGY) 5 MG/5ML SYRP; Take 10 mLs (10 mg total) by mouth daily.   Dental infection: Begin metronidazole, keep follow-up appointment with dentist tomorrow. Reiterated the importance of having the majority of her teeth removed due to persistent infections. Emphasized that this be best addressed by her dentist from now on. Refills for Zyrtec requested  Return if symptoms worsen or fail to improve.

## 2015-03-12 ENCOUNTER — Telehealth: Payer: Self-pay | Admitting: Family Medicine

## 2015-03-12 ENCOUNTER — Ambulatory Visit (INDEPENDENT_AMBULATORY_CARE_PROVIDER_SITE_OTHER): Payer: 59 | Admitting: Family Medicine

## 2015-03-12 ENCOUNTER — Encounter: Payer: Self-pay | Admitting: Family Medicine

## 2015-03-12 VITALS — BP 125/78 | HR 91 | Ht 63.0 in | Wt 193.0 lb

## 2015-03-12 DIAGNOSIS — K029 Dental caries, unspecified: Secondary | ICD-10-CM | POA: Diagnosis not present

## 2015-03-12 NOTE — Telephone Encounter (Signed)
Pt.notified

## 2015-03-12 NOTE — Telephone Encounter (Signed)
Janet Richards, Please see original recommendations from 12:14pm

## 2015-03-12 NOTE — Telephone Encounter (Signed)
Patient called and stated that the dentist Dr. Kermit Balo she saw advised that Janet Richards needs to go see a anesthesiologist and have teeth pulled asap. Loyd thinks that Dr. Kermit Balo does not know what she is talking about and says she think she has an infection in her gums. Thanks

## 2015-03-12 NOTE — Telephone Encounter (Signed)
Addition call from patient....Janet KitchenMarland KitchenPatient called back and said that she told the dentist that we were going to call his office.

## 2015-03-12 NOTE — Progress Notes (Signed)
CC: Janet Richards is a 54 y.o. female is here for gum issues   Subjective: HPI:  Since I saw her last she saw Dr. Kermit Balo DDS and patient tells me that she was informed that oral antibiotics are ineffective for her gum disease and dental infections and instead she needs to be either admitted for IV antibiotics or at least receiving IV antibiotics from a infusion center. She tells me that she is under the impression that any antibiotic that she does not have an allergy to will be sufficient and that she was told by Dr. Kermit Balo that this needs to be handled by myself. She tells me that when she was being examined her gums were tender to the point where she had to call off the examination prematurely.  She was referred to an oral surgeon for removal of at least 4 of her teeth however the patient has reservations about doing this because of the cost.  She is basically here today to asking to start IV antibiotics.   Review Of Systems Outlined In HPI  Past Medical History  Diagnosis Date  . Urticaria   . Essential hypertension, benign   . Primary osteoarthritis of both knees   . Exertional chest pain   . Menstrual migraine     Past Surgical History  Procedure Laterality Date  . Tubal ligation  12/26/1987   Family History  Problem Relation Age of Onset  . Hypertension Mother     History   Social History  . Marital Status: Married    Spouse Name: N/A  . Number of Children: N/A  . Years of Education: N/A   Occupational History  . homemaker    Social History Main Topics  . Smoking status: Former Smoker -- 1.50 packs/day    Types: Cigarettes    Quit date: 02/05/2006  . Smokeless tobacco: Never Used  . Alcohol Use: Yes     Comment: rarely  . Drug Use: No  . Sexual Activity:    Partners: Male   Other Topics Concern  . Not on file   Social History Narrative     Objective: BP 125/78 mmHg  Pulse 91  Ht 5\' 3"  (1.6 m)  Wt 193 lb (87.544 kg)  BMI 34.20 kg/m2  Vital signs  reviewed. General: Alert and Oriented, No Acute Distress HEENT: Pupils equal, round, reactive to light. Conjunctivae clear.  External ears unremarkable.  Moist mucous membranes. Lungs: Clear and comfortable work of breathing, speaking in full sentences without accessory muscle use. Cardiac: Regular rate and rhythm.  Neuro: CN II-XII grossly intact, gait normal. Extremities: No peripheral edema.  Strong peripheral pulses.  Mental Status: No depression, anxiety, nor agitation. Logical though process. Skin: Warm and dry. Assessment & Plan: Janet Richards was seen today for gum issues.  Diagnoses and all orders for this visit:  Dental caries   I was somewhat confused by the recommendations that the patient told me she was told by Dr. Kermit Balo so I called Dr. Kermit Balo who gave me the following recommendations that she told the patient at the most recent encounter:  Patient was referred to a periodontist because she could not go through with a full oral examination due to pain. This referral was placed because this other dentist would be able to sedate her to control her pain while performing a routine dental examination. The patient was also referred to an oral surgeon to have multiple decaying teeth removed. I specifically asked about IV antibiotics were recommended and Dr.  good says that there is no indication for this.  Before the patient left I stressed the importance of having her teeth removed as soon as possible.  I'm going to now take a more "tough love" approach to her condition by deferring further dental and gum care to the referrals that were placed by her dentist.  25 minutes spent face-to-face during visit today of which at least 50% was counseling or coordinating care regarding: 1. Dental caries      No Follow-up on file.

## 2015-03-12 NOTE — Telephone Encounter (Signed)
Janet Richards, Will you please let patient know that I called Dr. Kermit Balo at [facility] and she had the following advice: Patient was referred to a periodontist because she could not go through with a full oral examination due to pain. This referral was placed because this other type of dentist would be able to sedate her to control her pain while performing a routine dental examination. The patient was also referred to an oral surgeon to have multiple decaying teeth removed. I specifically asked about IV antibiotics were recommended and Dr. Kermit Balo says that there is no indication for this.  Dr. Kermit Balo agreed that these two different visits should be addressed ASAP.  I don't think antibiotics are the solution and further dental and gum care should be through Dr. Kermit Balo and the other referrals.    (I'm going to ask the front staff to not schedule her with me for any dental or gum concerns now on, she has been non-compliant with my recommendations since  03/27/2013)

## 2015-03-19 DIAGNOSIS — Z1211 Encounter for screening for malignant neoplasm of colon: Secondary | ICD-10-CM | POA: Diagnosis not present

## 2015-03-19 DIAGNOSIS — D125 Benign neoplasm of sigmoid colon: Secondary | ICD-10-CM | POA: Diagnosis not present

## 2015-03-20 ENCOUNTER — Encounter: Payer: Self-pay | Admitting: Family Medicine

## 2015-03-20 ENCOUNTER — Other Ambulatory Visit: Payer: Self-pay | Admitting: Family Medicine

## 2015-03-20 DIAGNOSIS — Z860100 Personal history of colon polyps, unspecified: Secondary | ICD-10-CM

## 2015-03-20 HISTORY — DX: Personal history of colon polyps, unspecified: Z86.0100

## 2015-03-28 DIAGNOSIS — Z7951 Long term (current) use of inhaled steroids: Secondary | ICD-10-CM | POA: Diagnosis not present

## 2015-03-28 DIAGNOSIS — Z885 Allergy status to narcotic agent status: Secondary | ICD-10-CM | POA: Diagnosis not present

## 2015-03-28 DIAGNOSIS — R072 Precordial pain: Secondary | ICD-10-CM | POA: Diagnosis not present

## 2015-03-28 DIAGNOSIS — Z883 Allergy status to other anti-infective agents status: Secondary | ICD-10-CM | POA: Diagnosis not present

## 2015-03-28 DIAGNOSIS — Z6832 Body mass index (BMI) 32.0-32.9, adult: Secondary | ICD-10-CM | POA: Diagnosis not present

## 2015-03-28 DIAGNOSIS — Z880 Allergy status to penicillin: Secondary | ICD-10-CM | POA: Diagnosis not present

## 2015-03-28 DIAGNOSIS — Z886 Allergy status to analgesic agent status: Secondary | ICD-10-CM | POA: Diagnosis not present

## 2015-03-28 DIAGNOSIS — E669 Obesity, unspecified: Secondary | ICD-10-CM | POA: Diagnosis not present

## 2015-03-28 DIAGNOSIS — Z79890 Hormone replacement therapy (postmenopausal): Secondary | ICD-10-CM | POA: Diagnosis not present

## 2015-03-28 DIAGNOSIS — R079 Chest pain, unspecified: Secondary | ICD-10-CM | POA: Diagnosis not present

## 2015-03-28 DIAGNOSIS — Z79899 Other long term (current) drug therapy: Secondary | ICD-10-CM | POA: Diagnosis not present

## 2015-03-29 ENCOUNTER — Other Ambulatory Visit: Payer: Self-pay | Admitting: Family Medicine

## 2015-03-31 ENCOUNTER — Encounter: Payer: Self-pay | Admitting: Family Medicine

## 2015-03-31 ENCOUNTER — Ambulatory Visit (INDEPENDENT_AMBULATORY_CARE_PROVIDER_SITE_OTHER): Payer: 59 | Admitting: Family Medicine

## 2015-03-31 VITALS — BP 171/88 | HR 117 | Wt 192.0 lb

## 2015-03-31 DIAGNOSIS — R079 Chest pain, unspecified: Secondary | ICD-10-CM | POA: Diagnosis not present

## 2015-03-31 NOTE — Progress Notes (Signed)
CC: Janet Richards is a 54 y.o. female is here for Follow-up   Subjective: HPI:   Recently reestablished with a new dentist. Dr. Amada Kingfisher [phone]. She tells me that he has plans for her to have her inferior right molar and remaining superior left molar removed within the next few months. She states that money for this procedure. After this the plan would be to have the remaining teeth cleaned. She decided that she does not want to pursue getting her teeth cleaned under anesthesia due to the expense of the procedure. She comes to me with concerns of a recent emergency room visit late last week for chest pain. She feels like this pain has been present for over a year now however it was accompanied by left jaw numbness and shortness of breath. An EKG shows sinus tachycardia, cardiac enzymes were negative, chest x-ray unremarkable, she was sent home after having potassium replaced. She learned from her dentist that periodontal disease can be linked to cardiovascular disease and was afraid she was having a heart attack. Since discharge no new chest pain or symptoms similar to that which prompted her emergency room visit.   Review Of Systems Outlined In HPI  Past Medical History  Diagnosis Date  . Urticaria   . Essential hypertension, benign   . Primary osteoarthritis of both knees   . Exertional chest pain   . Menstrual migraine     Past Surgical History  Procedure Laterality Date  . Tubal ligation  12/26/1987   Family History  Problem Relation Age of Onset  . Hypertension Mother     History   Social History  . Marital Status: Married    Spouse Name: N/A  . Number of Children: N/A  . Years of Education: N/A   Occupational History  . homemaker    Social History Main Topics  . Smoking status: Former Smoker -- 1.50 packs/day    Types: Cigarettes    Quit date: 02/05/2006  . Smokeless tobacco: Never Used  . Alcohol Use: Yes     Comment: rarely  . Drug Use: No  .  Sexual Activity:    Partners: Male   Other Topics Concern  . Not on file   Social History Narrative     Objective: BP 171/88 mmHg  Pulse 117  Wt 192 lb (87.091 kg)  Vital signs reviewed. General: Alert and Oriented, No Acute Distress HEENT: Pupils equal, round, reactive to light. Conjunctivae clear.  External ears unremarkable.  Moist mucous membranes. Lungs: Clear and comfortable work of breathing, speaking in full sentences without accessory muscle use. Cardiac: Regular rate and rhythm.  Neuro: CN II-XII grossly intact, gait normal. Extremities: No peripheral edema.  Strong peripheral pulses.  Mental Status: No depression, anxiety, nor agitation. Logical though process. Skin: Warm and dry.  Assessment & Plan: Janet Richards was seen today for follow-up.  Diagnoses and all orders for this visit:  Exertional chest pain Orders: -     Ambulatory referral to Cardiology   Patient was 9 minutes late for her 15 minute allotted slot and with our time together we were able to arrange a cardiology referral for her chest pain. I also reminded her to have the 2 teeth that have been causing problems removed as soon as possible and to have her teeth cleaning done as soon as possible as well.  I've asked her to scheduled appointment with me at her convenience to go over blood pressure and any other health concerns.  Return  if symptoms worsen or fail to improve.

## 2015-04-07 ENCOUNTER — Other Ambulatory Visit: Payer: Self-pay | Admitting: Family Medicine

## 2015-04-07 DIAGNOSIS — R0981 Nasal congestion: Secondary | ICD-10-CM

## 2015-04-07 MED ORDER — CETIRIZINE HCL 5 MG/5ML PO SYRP: 10.0000 mg | ORAL_SOLUTION | Freq: Every day | ORAL | Status: DC

## 2015-04-07 NOTE — Telephone Encounter (Signed)
Pharmacy requested 90day supply per insurance.

## 2015-04-10 ENCOUNTER — Encounter: Payer: Self-pay | Admitting: Family Medicine

## 2015-04-10 ENCOUNTER — Ambulatory Visit (INDEPENDENT_AMBULATORY_CARE_PROVIDER_SITE_OTHER): Payer: 59 | Admitting: Family Medicine

## 2015-04-10 VITALS — BP 135/81 | HR 97 | Wt 192.0 lb

## 2015-04-10 DIAGNOSIS — H6691 Otitis media, unspecified, right ear: Secondary | ICD-10-CM

## 2015-04-10 MED ORDER — DOXYCYCLINE HYCLATE 100 MG PO TABS: ORAL_TABLET | ORAL | Status: AC

## 2015-04-10 NOTE — Progress Notes (Signed)
CC: Janet Richards is a 54 y.o. female is here for check ears   Subjective: HPI:  Right ear pain described as sharpness moderate in severity present for the last 3 or 4 days. Nothing particularly makes it better or worse and seems to be worsening though on a daily basis. Present all hours of days. Similar sensation in the left ear but right worse than left. Accompanied by mild decreased hearing and tinnitus in both ears. Reports subjective fever over the past 2 days. Denies nasal congestion, sore throat, postnasal drip, cough, shortness of breath or wheezing. Denies dizziness or motor or sensory disturbances. No headache.   Review Of Systems Outlined In HPI  Past Medical History  Diagnosis Date  . Urticaria   . Essential hypertension, benign   . Primary osteoarthritis of both knees   . Exertional chest pain   . Menstrual migraine     Past Surgical History  Procedure Laterality Date  . Tubal ligation  12/26/1987   Family History  Problem Relation Age of Onset  . Hypertension Mother     History   Social History  . Marital Status: Married    Spouse Name: N/A  . Number of Children: N/A  . Years of Education: N/A   Occupational History  . homemaker    Social History Main Topics  . Smoking status: Former Smoker -- 1.50 packs/day    Types: Cigarettes    Quit date: 02/05/2006  . Smokeless tobacco: Never Used  . Alcohol Use: Yes     Comment: rarely  . Drug Use: No  . Sexual Activity:    Partners: Male   Other Topics Concern  . Not on file   Social History Narrative     Objective: BP 135/81 mmHg  Pulse 97  Wt 192 lb (87.091 kg)  General: Alert and Oriented, No Acute Distress HEENT: Pupils equal, round, reactive to light. Conjunctivae clear.  External ears unremarkable, canals clear with intact TMs with appropriate landmarks. Left Middle ear appears open without effusion. Right middle ear with a mild purulent effusion. Pink inferior turbinates.  Moist mucous  membranes, pharynx without inflammation nor lesions.  Neck supple without palpable lymphadenopathy nor abnormal masses. Lungs: Clear and comfortable work of breathing Cardiac: Regular rate and rhythm.  Skin: Warm and dry.  Assessment & Plan: Janet Richards was seen today for check ears.  Diagnoses and all orders for this visit:  Acute right otitis media, recurrence not specified, unspecified otitis media type Orders: -     doxycycline (VIBRA-TABS) 100 MG tablet; One by mouth twice a day for ten days.   Acute otitis media, start doxycycline.   Return if symptoms worsen or fail to improve.

## 2015-04-16 ENCOUNTER — Telehealth: Payer: Self-pay | Admitting: *Deleted

## 2015-04-16 MED ORDER — FLUCONAZOLE 150 MG PO TABS: 150.0000 mg | ORAL_TABLET | Freq: Once | ORAL | Status: DC

## 2015-04-16 NOTE — Telephone Encounter (Signed)
Pt requests diflucan be sent in. She feels she may have a yeast infection from recently prescribed abx

## 2015-04-28 ENCOUNTER — Telehealth: Payer: Self-pay | Admitting: *Deleted

## 2015-04-28 MED ORDER — DOXYCYCLINE HYCLATE 100 MG PO TABS: ORAL_TABLET | ORAL | Status: AC

## 2015-04-28 NOTE — Telephone Encounter (Signed)
Pt called and states her ears feel like they never cleared up from the infection that she had. She is requesting another round of abx be called in

## 2015-04-28 NOTE — Telephone Encounter (Signed)
Doxycycline sent to [facility] on [address]

## 2015-04-29 ENCOUNTER — Other Ambulatory Visit: Payer: Self-pay | Admitting: Family Medicine

## 2015-05-07 ENCOUNTER — Telehealth: Payer: Self-pay | Admitting: *Deleted

## 2015-05-07 MED ORDER — FLUCONAZOLE 150 MG PO TABS: 150.0000 mg | ORAL_TABLET | Freq: Every day | ORAL | Status: DC

## 2015-05-07 NOTE — Progress Notes (Signed)
HPI: 54 year old female for evaluation of chest pain.  Current Outpatient Prescriptions  Medication Sig Dispense Refill  . albuterol (PROVENTIL HFA;VENTOLIN HFA) 108 (90 BASE) MCG/ACT inhaler Inhale into the lungs every 6 (six) hours as needed for wheezing or shortness of breath.    [name] azelastine (ASTELIN) 0.1 % nasal spray Place 1 spray into both nostrils 2 (two) times daily. Use in each nostril as directed 30 mL 12  . carisoprodol (SOMA) 350 MG tablet Take 1 tablet (350 mg total) by mouth 3 (three) times daily as needed (back pain). 30 tablet 0  . cetirizine HCl (ZYRTEC CHILDRENS ALLERGY) 5 MG/5ML SYRP Take 10 mLs (10 mg total) by mouth daily. 900 mL 0  . cyclobenzaprine (FLEXERIL) 10 MG tablet Take a half to a full tab every 8-12 hours only as needed for muscle spasm, may cause sedation. 45 tablet 0  . doxycycline (VIBRA-TABS) 100 MG tablet One by mouth twice a day for ten days. 20 tablet 0  . fluconazole (DIFLUCAN) 150 MG tablet Take 1 tablet (150 mg total) by mouth once. 1 tablet 0  . fluconazole (DIFLUCAN) 150 MG tablet Take 1 tablet (150 mg total) by mouth daily. 1 tablet 0  . hydrochlorothiazide (HYDRODIURIL) 25 MG tablet TAKE 1 TABLET (25 MG TOTAL) BY MOUTH DAILY. 90 tablet 2  . hydrOXYzine (ATARAX/VISTARIL) 10 MG tablet Take 10 mg by mouth 3 (three) times daily as needed.    . methocarbamol (ROBAXIN) 500 MG tablet Take 1 tablet (500 mg total) by mouth every 8 (eight) hours as needed for muscle spasms. 60 tablet 1  . montelukast (SINGULAIR) 10 MG tablet     . Multiple Vitamins-Minerals (MULTIVITAMIN PO) Take by mouth.    . pantoprazole (PROTONIX) 40 MG tablet TAKE 1 TABLET (40 MG TOTAL) BY MOUTH DAILY. 90 tablet 1  . traMADol (ULTRAM) 50 MG tablet Take 1 tablet (50 mg total) by mouth every 8 (eight) hours as needed. 50 tablet 2  . triamcinolone (KENALOG) 0.1 % paste Use as directed 1 application in the mouth or throat 2 (two) times daily. For one week. 5 g 1   No current  facility-administered medications for this visit.    Allergies  Allergen Reactions  . Aspirin   . Clindamycin/Lincomycin     Facial swelling  . Cortisporin [Bacitra-Neomycin-Polymyxin-Hc]   . Erythromycin Nausea And Vomiting    Has taken azithromycin with no issues in the past.   . Metronidazole     Rash  . Morphine And Related     Religious reasons  . Nsaids Swelling  . Penicillins Swelling    She denies swelling or hives from penicillin. She states penicillin not effective for infections. Has taken cephalosporins without problems    Past Medical History  Diagnosis Date  . Urticaria   . Essential hypertension, benign   . Primary osteoarthritis of both knees   . Exertional chest pain   . Menstrual migraine     Past Surgical History  Procedure Laterality Date  . Tubal ligation  12/26/1987    History   Social History  . Marital Status: Married    Spouse Name: N/A  . Number of Children: N/A  . Years of Education: N/A   Occupational History  . homemaker    Social History Main Topics  . Smoking status: Former Smoker -- 1.50 packs/day    Types: Cigarettes    Quit date: 02/05/2006  . Smokeless tobacco: Never Used  . Alcohol Use: Yes  Comment: rarely  . Drug Use: No  . Sexual Activity:    Partners: Male   Other Topics Concern  . Not on file   Social History Narrative    Family History  Problem Relation Age of Onset  . Hypertension Mother     ROS: no fevers or chills, productive cough, hemoptysis, dysphasia, odynophagia, melena, hematochezia, dysuria, hematuria, rash, seizure activity, orthopnea, PND, pedal edema, claudication. Remaining systems are negative.  Physical Exam:   There were no vitals taken for this visit.  General:  Well developed/well nourished in NAD Skin warm/dry Patient not depressed No peripheral clubbing Back-normal HEENT-normal/normal eyelids Neck supple/normal carotid upstroke bilaterally; no bruits; no JVD; no  thyromegaly chest - CTA/ normal expansion CV - RRR/normal S1 and S2; no murmurs, rubs or gallops;  PMI nondisplaced Abdomen -NT/ND, no HSM, no mass, + bowel sounds, no bruit 2+ femoral pulses, no bruits Ext-no edema, chords, 2+ DP Neuro-grossly nonfocal  ECG    This encounter was created in error - please disregard.

## 2015-05-07 NOTE — Telephone Encounter (Signed)
Pt states she is getting a yeast infection from the abx that was recently rx'ed.pt requests diflucan

## 2015-05-08 ENCOUNTER — Encounter: Payer: 59 | Admitting: Cardiology

## 2015-05-12 ENCOUNTER — Emergency Department
Admission: EM | Admit: 2015-05-12 | Discharge: 2015-05-12 | Discharge status: Absent without leave | Payer: Self-pay | Source: Home / Self Care

## 2015-05-12 ENCOUNTER — Emergency Department (INDEPENDENT_AMBULATORY_CARE_PROVIDER_SITE_OTHER): Payer: Medicare Other

## 2015-05-12 ENCOUNTER — Emergency Department
Admission: EM | Admit: 2015-05-12 | Discharge: 2015-05-12 | Disposition: A | Discharge status: Home / Self Care | Payer: Medicare Other | Source: Home / Self Care | Attending: Emergency Medicine | Admitting: Emergency Medicine

## 2015-05-12 ENCOUNTER — Encounter: Payer: Self-pay | Admitting: *Deleted

## 2015-05-12 DIAGNOSIS — K760 Fatty (change of) liver, not elsewhere classified: Secondary | ICD-10-CM

## 2015-05-12 DIAGNOSIS — I10 Essential (primary) hypertension: Secondary | ICD-10-CM | POA: Diagnosis not present

## 2015-05-12 DIAGNOSIS — R52 Pain, unspecified: Secondary | ICD-10-CM | POA: Diagnosis not present

## 2015-05-12 DIAGNOSIS — R1084 Generalized abdominal pain: Secondary | ICD-10-CM | POA: Diagnosis not present

## 2015-05-12 DIAGNOSIS — R109 Unspecified abdominal pain: Secondary | ICD-10-CM | POA: Diagnosis not present

## 2015-05-12 LAB — POCT URINALYSIS DIP (MANUAL ENTRY)
Bilirubin, UA: NEGATIVE
Glucose, UA: NEGATIVE
Ketones, POC UA: NEGATIVE
Leukocytes, UA: NEGATIVE
Nitrite, UA: NEGATIVE
Protein Ur, POC: NEGATIVE
Spec Grav, UA: 1.01 (ref 1.005–1.03)
Urobilinogen, UA: 0.2 (ref 0–1)
pH, UA: 6.5 (ref 5–8)

## 2015-05-12 LAB — POCT CBC W AUTO DIFF (K'VILLE URGENT CARE)

## 2015-05-12 MED ORDER — SUCRALFATE 1 GM/10ML PO SUSP: 1.0000 g | Freq: Three times a day (TID) | ORAL | Status: DC

## 2015-05-12 NOTE — ED Notes (Signed)
Pt c/o 1 week of abdominal bloating and epigastric pain with lying down. Patient takes omeprazole daily and has increased to twice daily since this started.

## 2015-05-12 NOTE — ED Provider Notes (Signed)
CSN: [id number]     Arrival date & time 05/12/15  1053 History   First MD Initiated Contact with Patient 05/12/15 1112     Chief Complaint  Patient presents with  . Bloated  . Abdominal Pain    Epigastric Pain   (Consider location/radiation/quality/duration/timing/severity/associated sxs/prior Treatment) Patient is a 54 y.o. female presenting with abdominal pain. The history is provided by the patient. No language interpreter was used.  Abdominal Pain Pain location:  Generalized Pain quality: aching   Pain radiates to:  Epigastric region Pain severity:  Moderate Onset quality:  Gradual Duration:  2 days Timing:  Constant Progression:  Worsening Chronicity:  New Context: recent illness   Relieved by:  Nothing Worsened by:  Nothing tried Ineffective treatments:  None tried Associated symptoms: no vomiting   Risk factors: has not had multiple surgeries   Pt is on omeprazole.  Pt feels more bloated than usual.  Pt complains of epigastric discomfort.  Past Medical History  Diagnosis Date  . Urticaria   . Essential hypertension, benign   . Primary osteoarthritis of both knees   . Exertional chest pain   . Menstrual migraine    Past Surgical History  Procedure Laterality Date  . Tubal ligation  12/26/1987   Family History  Problem Relation Age of Onset  . Hypertension Mother    History  Substance Use Topics  . Smoking status: Former Smoker -- 1.50 packs/day    Types: Cigarettes    Quit date: 02/05/2006  . Smokeless tobacco: Never Used  . Alcohol Use: Yes     Comment: rarely   OB History    Gravida Para Term Preterm AB TAB SAB Ectopic Multiple Living   5 3 3  2 2    3      Review of Systems  Gastrointestinal: Positive for abdominal pain. Negative for vomiting.  All other systems reviewed and are negative.   Allergies  Aspirin; Clindamycin/lincomycin; Cortisporin; Erythromycin; Metronidazole; Morphine and related; Nsaids; and Penicillins  Home Medications    Prior to Admission medications   Medication Sig Start Date End Date Taking? Authorizing Provider  albuterol (PROVENTIL HFA;VENTOLIN HFA) 108 (90 BASE) MCG/ACT inhaler Inhale into the lungs every 6 (six) hours as needed for wheezing or shortness of breath.    Historical Provider, MD  azelastine (ASTELIN) 0.1 % nasal spray Place 1 spray into both nostrils 2 (two) times daily. Use in each nostril as directed 06/03/14   Marcial Pacas, DO  carisoprodol (SOMA) 350 MG tablet Take 1 tablet (350 mg total) by mouth 3 (three) times daily as needed (back pain). 08/08/13   Sean Hommel, DO  cetirizine HCl (ZYRTEC CHILDRENS ALLERGY) 5 MG/5ML SYRP Take 10 mLs (10 mg total) by mouth daily. 04/07/15   Marcial Pacas, DO  cyclobenzaprine (FLEXERIL) 10 MG tablet Take a half to a full tab every 8-12 hours only as needed for muscle spasm, may cause sedation. 08/08/13   Sean Hommel, DO  hydrochlorothiazide (HYDRODIURIL) 25 MG tablet TAKE 1 TABLET (25 MG TOTAL) BY MOUTH DAILY. 04/29/15   Marcial Pacas, DO  hydrOXYzine (ATARAX/VISTARIL) 10 MG tablet Take 10 mg by mouth 3 (three) times daily as needed.    Historical Provider, MD  methocarbamol (ROBAXIN) 500 MG tablet Take 1 tablet (500 mg total) by mouth every 8 (eight) hours as needed for muscle spasms. 11/22/13   Marcial Pacas, DO  montelukast (SINGULAIR) 10 MG tablet  04/04/13   Historical Provider, MD  Multiple Vitamins-Minerals (MULTIVITAMIN PO) Take by  mouth.    Historical Provider, MD  pantoprazole (PROTONIX) 40 MG tablet TAKE 1 TABLET (40 MG TOTAL) BY MOUTH DAILY. 03/20/15   Sean Hommel, DO  traMADol (ULTRAM) 50 MG tablet Take 1 tablet (50 mg total) by mouth every 8 (eight) hours as needed. 02/18/15   Sean Hommel, DO  triamcinolone (KENALOG) 0.1 % paste Use as directed 1 application in the mouth or throat 2 (two) times daily. For one week. 06/17/14   Sean Hommel, DO   BP 101/69 mmHg  Pulse 81  Temp(Src) 98.1 F (36.7 C) (Oral)  Resp 14  SpO2 98%  LMP 04/07/2015 Physical Exam   Constitutional: She is oriented to person, place, and time. She appears well-developed and well-nourished.  HENT:  Head: Normocephalic.  Right Ear: External ear normal.  Left Ear: External ear normal.  Nose: Nose normal.  Mouth/Throat: Oropharynx is clear and moist.  Eyes: Conjunctivae and EOM are normal. Pupils are equal, round, and reactive to light.  Neck: Normal range of motion.  Cardiovascular: Normal rate and normal heart sounds.   Pulmonary/Chest: Effort normal.  Abdominal: Soft. She exhibits no distension. There is tenderness.  Musculoskeletal: Normal range of motion.  Neurological: She is alert and oriented to person, place, and time.  Skin: Skin is warm.  Psychiatric: She has a normal mood and affect.  Nursing note and vitals reviewed.   ED Course  Procedures (including critical care time) Labs Review Labs Reviewed  POCT URINALYSIS DIP (MANUAL ENTRY) - Abnormal; Notable for the following:    Blood, UA trace-lysed (*)    All other components within normal limits  COMPREHENSIVE METABOLIC PANEL  LIPASE, BLOOD  POCT CBC W AUTO DIFF ([facility])    Imaging Review US Abdomen Complete  05/12/2015   CLINICAL DATA:  54 year old female with abdominal pain, bloating, nausea and diarrhea for 1 week. Initial encounter.  EXAM: ULTRASOUND ABDOMEN COMPLETE  COMPARISON:  Abdomen ultrasound 10/22/2014  FINDINGS: Gallbladder: No gallstones or wall thickening visualized. No sonographic Murphy sign noted.  Common bile duct: Diameter: 2 mm, normal  Liver: Diffusely increased in echogenicity (image 44). This appears new or increased since 2015 (versus image 41 at that time). No intrahepatic biliary ductal dilatation or discrete liver lesion.  IVC: Incompletely visualized due to overlying bowel gas, visualized portions within normal limits.  Pancreas: Visualized portions within normal limits.  Spleen: Size and appearance within normal limits.  Right Kidney: Length: 10.1 cm. Echogenicity  within normal limits. No mass or hydronephrosis visualized.  Left Kidney: Length: 9.8 cm. Echogenicity within normal limits. No mass or hydronephrosis visualized.  Abdominal aorta: No aneurysm visualized.  Other findings: None.  IMPRESSION: 1. Fatty liver disease, appears progressed since 2015. 2. Otherwise negative abdomen ultrasound.   Electronically Signed   By: Genevie Ann M.D.   On: 05/12/2015 12:49     MDM  Pt counseled on results.  Pt advised she needs follow up.  I will add carafate. Pt is filling better.   1. Generalized abdominal pain   2. Pain    Meds ordered this encounter  Medications  . sucralfate (CARAFATE) 1 GM/10ML suspension    Sig: Take 10 mLs (1 g total) by mouth 4 (four) times daily -  with meals and at bedtime.    Dispense:  420 mL    Refill:  0    Order Specific Question:  Supervising Provider    Answer:  Burnett Harry, DAVID [[id number]]    AVS See your Physicain for recheck  Fransico Meadow, PA-C 05/14/15 [place], PA-C 05/14/15 [phone]

## 2015-05-12 NOTE — Discharge Instructions (Signed)

## 2015-05-13 LAB — COMPREHENSIVE METABOLIC PANEL
ALT: 37 U/L — ABNORMAL HIGH (ref 0–35)
AST: 22 U/L (ref 0–37)
Albumin: 4.3 g/dL (ref 3.5–5.2)
Alkaline Phosphatase: 91 U/L (ref 39–117)
BUN: 10 mg/dL (ref 6–23)
CO2: 31 mEq/L (ref 19–32)
Calcium: 9.6 mg/dL (ref 8.4–10.5)
Chloride: 101 mEq/L (ref 96–112)
Creat: 0.83 mg/dL (ref 0.50–1.10)
Glucose, Bld: 77 mg/dL (ref 70–99)
Potassium: 3.6 mEq/L (ref 3.5–5.3)
Sodium: 140 mEq/L (ref 135–145)
Total Bilirubin: 0.5 mg/dL (ref 0.2–1.2)
Total Protein: 7 g/dL (ref 6.0–8.3)

## 2015-05-13 LAB — LIPASE: Lipase: 18 U/L (ref 0–75)

## 2015-05-15 ENCOUNTER — Telehealth: Payer: Self-pay | Admitting: *Deleted

## 2015-05-26 ENCOUNTER — Encounter: Payer: Self-pay | Admitting: Family Medicine

## 2015-05-26 ENCOUNTER — Ambulatory Visit (INDEPENDENT_AMBULATORY_CARE_PROVIDER_SITE_OTHER): Payer: Medicare Other | Admitting: Family Medicine

## 2015-05-26 VITALS — BP 114/69 | HR 91 | Wt 194.0 lb

## 2015-05-26 DIAGNOSIS — K760 Fatty (change of) liver, not elsewhere classified: Secondary | ICD-10-CM

## 2015-05-26 DIAGNOSIS — J4520 Mild intermittent asthma, uncomplicated: Secondary | ICD-10-CM | POA: Diagnosis not present

## 2015-05-26 DIAGNOSIS — R8290 Unspecified abnormal findings in urine: Secondary | ICD-10-CM | POA: Diagnosis not present

## 2015-05-26 DIAGNOSIS — R1084 Generalized abdominal pain: Secondary | ICD-10-CM

## 2015-05-26 LAB — POCT URINALYSIS DIPSTICK
Bilirubin, UA: NEGATIVE
Glucose, UA: NEGATIVE
Ketones, UA: NEGATIVE
Leukocytes, UA: NEGATIVE
Nitrite, UA: NEGATIVE
Protein, UA: NEGATIVE
Spec Grav, UA: 1.01
Urobilinogen, UA: 0.2
pH, UA: 6

## 2015-05-26 MED ORDER — SUCRALFATE 1 GM/10ML PO SUSP: 1.0000 g | Freq: Three times a day (TID) | ORAL | Status: DC

## 2015-05-26 MED ORDER — ALBUTEROL SULFATE HFA 108 (90 BASE) MCG/ACT IN AERS: INHALATION_SPRAY | RESPIRATORY_TRACT | Status: DC

## 2015-05-26 NOTE — Progress Notes (Signed)
CC: Janet Richards is a 54 y.o. female is here for fatty liver on [place]   Subjective: HPI:  Earlier this month she was seen at urgent care and evaluated for abdominal pain. She tells me that symptoms have significantly decreased since starting sucralfate. She is also taking Protonix on a daily basis. She was found to have hepatic steatosis with a mild elevation of her ALT. She has a history of hypertriglyceridemia. She is actively trying to lose weight with diet and exercise. She denies any focal right upper quadrant pain, scleral icterus, jaundice, fevers nor nausea.  Follow-up asthma: Requesting refills on albuterol she lost her inhaler while on vacation  Complains of malodorous urine that has been present for an unknown amount of time but estimated for about a week. During all voiding it smells"bad". No interventions as of yet. Denies dysuria, urinary frequency, urgency. She is worried she might have a UTI.  She was to get my opinion on various dental questions including whether or not her dentist did a good job with recent tooth extraction and whether or not she did a permanent or a temporary bridge  Review Of Systems Outlined In HPI  Past Medical History  Diagnosis Date  . Urticaria   . Essential hypertension, benign   . Primary osteoarthritis of both knees   . Exertional chest pain   . Menstrual migraine     Past Surgical History  Procedure Laterality Date  . Tubal ligation  12/26/1987   Family History  Problem Relation Age of Onset  . Hypertension Mother     History   Social History  . Marital Status: Married    Spouse Name: N/A  . Number of Children: N/A  . Years of Education: N/A   Occupational History  . homemaker    Social History Main Topics  . Smoking status: Former Smoker -- 1.50 packs/day    Types: Cigarettes    Quit date: 02/05/2006  . Smokeless tobacco: Never Used  . Alcohol Use: Yes     Comment: rarely  . Drug Use: No  . Sexual Activity:   Partners: Male   Other Topics Concern  . Not on file   Social History Narrative     Objective: BP 114/69 mmHg  Pulse 91  Wt 194 lb (87.998 kg)  LMP 04/07/2015  General: Alert and Oriented, No Acute Distress HEENT: Pupils equal, round, reactive to light. Conjunctivae clear.  External ears unremarkable, canals clear with intact TMs with appropriate landmarks.  Middle ear appears open without effusion. Pink inferior turbinates.  Moist mucous membranes, pharynx without inflammation nor lesions.   Lungs: Clinical work of breathing Cardiac: Regular rate and rhythm.  Abdomen: Obese and soft Extremities: No peripheral edema.  Strong peripheral pulses.  Mental Status: No depression, anxiety, nor agitation. Skin: Warm and dry. Requires frequent redirection and a reminder that I'm not a dentist and that her dental concerns should be directed to her new dentist. There does not appear to be any acute dental issues.  Assessment & Plan: Janet Richards was seen today for fatty liver on [place].  Diagnoses and all orders for this visit:  Malodorous urine Orders: -     Urinalysis Dipstick -     Urine Culture  Generalized abdominal pain Orders: -     sucralfate (CARAFATE) 1 GM/10ML suspension; Take 10 mLs (1 g total) by mouth 4 (four) times daily -  with meals and at bedtime.  Hepatic steatosis  Asthma, chronic, mild intermittent, uncomplicated Orders: -  albuterol (PROVENTIL HFA;VENTOLIN HFA) 108 (90 BASE) MCG/ACT inhaler; Inhale two puffs every 4-6 hours only as needed for shortness of breath or wheezing.  Follow-up with dentistry as soon as possible for answering all dental questions Generalized abdominal pain: Improving with Carafate Hepatic steatosis: Encouraged to start vitamin E 400 units twice a day, this is over-the-counter. Also encouraged to lose weight as this is the best intervention when it comes to triglyceride deposition in the liver. She doesn't drink alcohol. Asthma: Controlled  continue as needed albuterol  40 minutes spent face-to-face during visit today of which at least 50% was counseling or coordinating care regarding: 1. Malodorous urine   2. Generalized abdominal pain   3. Hepatic steatosis   4. Asthma, chronic, mild intermittent, uncomplicated      Return if symptoms worsen or fail to improve.

## 2015-05-28 ENCOUNTER — Telehealth: Payer: Self-pay | Admitting: *Deleted

## 2015-05-28 LAB — URINE CULTURE: Colony Count: 3000

## 2015-05-28 NOTE — Telephone Encounter (Signed)
Pt states she has an area around her gum in which the tooth broke off in her gums. She states her gums feel swollen on one side and and it makes it  "feel like my throat is closing in the morning when I get up." Pt wanted to know if she needed an antibiotic called in for the tooth. On the phone pt had no difficulty speaking and didn't sound as if she had difficulty breathing. I told pt that this is a dental issue that needs to be addressed by the dentist. I advised to call the dentist office and see if they have a sooner appt then what she already has scheduled.

## 2015-05-31 ENCOUNTER — Emergency Department (INDEPENDENT_AMBULATORY_CARE_PROVIDER_SITE_OTHER)
Admission: EM | Admit: 2015-05-31 | Discharge: 2015-05-31 | Disposition: A | Discharge status: Home / Self Care | Payer: Medicare Other | Source: Home / Self Care | Attending: Family Medicine | Admitting: Family Medicine

## 2015-05-31 ENCOUNTER — Encounter: Payer: Self-pay | Admitting: Emergency Medicine

## 2015-05-31 DIAGNOSIS — K088 Other specified disorders of teeth and supporting structures: Secondary | ICD-10-CM

## 2015-05-31 DIAGNOSIS — K0889 Other specified disorders of teeth and supporting structures: Secondary | ICD-10-CM

## 2015-05-31 MED ORDER — METRONIDAZOLE 500 MG PO TABS: 500.0000 mg | ORAL_TABLET | Freq: Three times a day (TID) | ORAL | Status: DC

## 2015-05-31 NOTE — Discharge Instructions (Signed)
Try warm salt water gargles.  May take Tylenol for pain. If symptoms become significantly worse during the night or over the weekend, proceed to the local emergency room.    Dental Pain A tooth ache may be caused by cavities (tooth decay). Cavities expose the nerve of the tooth to air and hot or cold temperatures. It may come from an infection or abscess (also called a boil or furuncle) around your tooth. It is also often caused by dental caries (tooth decay). This causes the pain you are having. DIAGNOSIS  Your caregiver can diagnose this problem by exam. TREATMENT   If caused by an infection, it may be treated with medications which kill germs (antibiotics) and pain medications as prescribed by your caregiver. Take medications as directed.  Only take over-the-counter or prescription medicines for pain, discomfort, or fever as directed by your caregiver.  Whether the tooth ache today is caused by infection or dental disease, you should see your dentist as soon as possible for further care. SEEK MEDICAL CARE IF: The exam and treatment you received today has been provided on an emergency basis only. This is not a substitute for complete medical or dental care. If your problem worsens or new problems (symptoms) appear, and you are unable to meet with your dentist, call or return to this location. SEEK IMMEDIATE MEDICAL CARE IF:   You have a fever.  You develop redness and swelling of your face, jaw, or neck.  You are unable to open your mouth.  You have severe pain uncontrolled by pain medicine. MAKE SURE YOU:   Understand these instructions.  Will watch your condition.  Will get help right away if you are not doing well or get worse. Document Released: 10/24/2005 Document Revised: 01/16/2012 Document Reviewed: 06/11/2008 [facility] Patient Information 2015 [place], [place]. This information is not intended to replace advice given to you by your health care provider. Make sure you  discuss any questions you have with your health care provider.

## 2015-05-31 NOTE — ED Provider Notes (Signed)
CSN: [id number]     Arrival date & time 05/31/15  1431 History   First MD Initiated Contact with Patient 05/31/15 1451     Chief Complaint  Patient presents with  . Oral Swelling     HPI Comments: Patient reports that a left upper tooth broke about one month ago, and during the past two weeks she has had increasing pain in her left upper jaw and gum.  Recently she has developed a headache and chills but no fever.  She took a left-over Flagyl tab yesterday and has noted slight improvement today.  She has an appointment with a dentist on August 1.  Patient is a 54 y.o. female presenting with tooth pain. The history is provided by the patient.  Dental Pain Location:  Upper Upper teeth location:  13/LU 2nd bicuspid Quality:  Dull and aching Severity:  Mild Onset quality:  Gradual Duration:  2 weeks Timing:  Constant Progression:  Worsening Chronicity:  Recurrent Context: dental caries, dental fracture and poor dentition   Previous work-up:  Filled cavity Relieved by: antibiotic. Worsened by:  Cold food/drink, pressure and touching Ineffective treatments:  None tried Associated symptoms: facial swelling, gum swelling and headaches   Associated symptoms: no congestion, no difficulty swallowing, no drooling, no facial pain, no fever, no neck pain, no neck swelling, no oral bleeding, no oral lesions and no trismus   Risk factors: lack of dental care     Past Medical History  Diagnosis Date  . Urticaria   . Essential hypertension, benign   . Primary osteoarthritis of both knees   . Exertional chest pain   . Menstrual migraine    Past Surgical History  Procedure Laterality Date  . Tubal ligation  12/26/1987   Family History  Problem Relation Age of Onset  . Hypertension Mother    History  Substance Use Topics  . Smoking status: Former Smoker -- 1.50 packs/day    Types: Cigarettes    Quit date: 02/05/2006  . Smokeless tobacco: Never Used  . Alcohol Use: Yes     Comment:  rarely   OB History    Gravida Para Term Preterm AB TAB SAB Ectopic Multiple Living   5 3 3  2 2    3      Review of Systems  Constitutional: Negative for fever.  HENT: Positive for facial swelling. Negative for congestion, drooling and mouth sores.   Musculoskeletal: Negative for neck pain.  Neurological: Positive for headaches.  All other systems reviewed and are negative.   Allergies  Aspirin; Clindamycin/lincomycin; Cortisporin; Erythromycin; Metronidazole; Morphine and related; Nsaids; and Penicillins  Home Medications   Prior to Admission medications   Medication Sig Start Date End Date Taking? Authorizing Provider  albuterol (PROVENTIL HFA;VENTOLIN HFA) 108 (90 BASE) MCG/ACT inhaler Inhale two puffs every 4-6 hours only as needed for shortness of breath or wheezing. 05/26/15 05/25/16  Sean Hommel, DO  azelastine (ASTELIN) 0.1 % nasal spray Place 1 spray into both nostrils 2 (two) times daily. Use in each nostril as directed 06/03/14   Marcial Pacas, DO  cetirizine HCl (ZYRTEC CHILDRENS ALLERGY) 5 MG/5ML SYRP Take 10 mLs (10 mg total) by mouth daily. 04/07/15   Marcial Pacas, DO  cyclobenzaprine (FLEXERIL) 10 MG tablet Take a half to a full tab every 8-12 hours only as needed for muscle spasm, may cause sedation. 08/08/13   Sean Hommel, DO  hydrochlorothiazide (HYDRODIURIL) 25 MG tablet TAKE 1 TABLET (25 MG TOTAL) BY MOUTH DAILY. 04/29/15  Marcial Pacas, DO  hydrOXYzine (ATARAX/VISTARIL) 10 MG tablet Take 10 mg by mouth 3 (three) times daily as needed.    Historical Provider, MD  metroNIDAZOLE (FLAGYL) 500 MG tablet Take 1 tablet (500 mg total) by mouth 3 (three) times daily. 05/31/15   Kandra Nicolas, MD  montelukast (SINGULAIR) 10 MG tablet  04/04/13   Historical Provider, MD  Multiple Vitamins-Minerals (MULTIVITAMIN PO) Take by mouth.    Historical Provider, MD  pantoprazole (PROTONIX) 40 MG tablet TAKE 1 TABLET (40 MG TOTAL) BY MOUTH DAILY. 03/20/15   Sean Hommel, DO  sucralfate  (CARAFATE) 1 GM/10ML suspension Take 10 mLs (1 g total) by mouth 4 (four) times daily -  with meals and at bedtime. 05/26/15   Sean Hommel, DO  traMADol (ULTRAM) 50 MG tablet Take 1 tablet (50 mg total) by mouth every 8 (eight) hours as needed. 02/18/15   Sean Hommel, DO  triamcinolone (KENALOG) 0.1 % paste Use as directed 1 application in the mouth or throat 2 (two) times daily. For one week. 06/17/14   Sean Hommel, DO   BP 154/85 mmHg  Pulse 88  Temp(Src) 98.4 F (36.9 C) (Oral)  Ht 5\' 3"  (1.6 m)  Wt 192 lb (87.091 kg)  BMI 34.02 kg/m2  SpO2 99%  LMP 04/07/2015 Physical Exam  Constitutional: She appears well-developed and well-nourished. No distress.  Patient is obese (BMI 34.0)  HENT:  Head: Normocephalic.  Right Ear: Tympanic membrane, external ear and ear canal normal.  Left Ear: Tympanic membrane, external ear and ear canal normal.  Mouth/Throat: Uvula is midline, oropharynx is clear and moist and mucous membranes are normal. No oral lesions. No trismus in the jaw. Abnormal dentition. Dental caries present.    Tooth #13 in left upper jaw eroded and tender to tap.  Surrounding gingiva tender to palpation but minimally swollen.  No left facial swelling.  Eyes: Conjunctivae are normal. Pupils are equal, round, and reactive to light.  Neck: Neck supple.  Lymphadenopathy:    She has cervical adenopathy.  Nursing note and vitals reviewed.   ED Course  Procedures  none  MDM   1. Toothache    Begin Flagyl 500mg  TID (patient has taken without adverse effects) Try warm salt water gargles.  May take Tylenol for pain. Followup with dentist as scheduled If symptoms become significantly worse during the night or over the weekend, proceed to the local emergency room.     Kandra Nicolas, MD 05/31/15 [phone]

## 2015-05-31 NOTE — ED Notes (Signed)
Pt c/o that her left tooth broke off into pieces over the span of 2 weeks.  Left sided gum area is swollen and painful.  Pt has a dentist appt on August 1.

## 2015-06-09 ENCOUNTER — Encounter: Payer: Self-pay | Admitting: Family Medicine

## 2015-06-09 ENCOUNTER — Ambulatory Visit (INDEPENDENT_AMBULATORY_CARE_PROVIDER_SITE_OTHER): Payer: Medicare Other | Admitting: Family Medicine

## 2015-06-09 VITALS — BP 152/92 | HR 98 | Ht 63.0 in | Wt 156.0 lb

## 2015-06-09 DIAGNOSIS — J309 Allergic rhinitis, unspecified: Secondary | ICD-10-CM

## 2015-06-09 DIAGNOSIS — M170 Bilateral primary osteoarthritis of knee: Secondary | ICD-10-CM | POA: Diagnosis not present

## 2015-06-09 DIAGNOSIS — L500 Allergic urticaria: Secondary | ICD-10-CM | POA: Diagnosis not present

## 2015-06-09 MED ORDER — IPRATROPIUM BROMIDE 0.06 % NA SOLN: 2.0000 | Freq: Four times a day (QID) | NASAL | Status: DC

## 2015-06-09 MED ORDER — TRIAMCINOLONE ACETONIDE 0.1 % EX CREA: 1.0000 "application " | TOPICAL_CREAM | Freq: Two times a day (BID) | CUTANEOUS | Status: DC

## 2015-06-09 NOTE — Progress Notes (Signed)
Janet Richards is a 54 y.o. female who presents to [facility]  today for discuss several issues  1) urticaria: Patient has chronic intermittent urticaria. This typically is well-controlled with cetirizine and montelukast. However her symptoms have been worse for about a week and a half. She notes itchy hives that come and go. She's been using calamine spray as well as her typical chronic and a histamine medications.  2) sore throat and hoarse voice. Present for a few days. No runny nose cough congestion fevers or chills. She has tried again medications as listed above.  3) knee pain: Chronic. She notes typically when she has pain like this she does well with steroid injections. She would like steroid injections into both knees of possible. She denies any locking catching or giving well. She has mild swelling. Pain is felt predominantly on the anterior aspect of the knee and is worse with ambulation. No medications tried yet.   Past Medical History  Diagnosis Date  . Urticaria   . Essential hypertension, benign   . Primary osteoarthritis of both knees   . Exertional chest pain   . Menstrual migraine    Past Surgical History  Procedure Laterality Date  . Tubal ligation  12/26/1987   History  Substance Use Topics  . Smoking status: Former Smoker -- 1.50 packs/day    Types: Cigarettes    Quit date: 02/05/2006  . Smokeless tobacco: Never Used  . Alcohol Use: Yes     Comment: rarely   ROS as above Medications: Current Outpatient Prescriptions  Medication Sig Dispense Refill  . albuterol (PROVENTIL HFA;VENTOLIN HFA) 108 (90 BASE) MCG/ACT inhaler Inhale two puffs every 4-6 hours only as needed for shortness of breath or wheezing. 1 Inhaler 1  . azelastine (ASTELIN) 0.1 % nasal spray Place 1 spray into both nostrils 2 (two) times daily. Use in each nostril as directed 30 mL 12  . cetirizine HCl (ZYRTEC CHILDRENS ALLERGY) 5 MG/5ML SYRP Take 10 mLs (10  mg total) by mouth daily. 900 mL 0  . cyclobenzaprine (FLEXERIL) 10 MG tablet Take a half to a full tab every 8-12 hours only as needed for muscle spasm, may cause sedation. 45 tablet 0  . hydrochlorothiazide (HYDRODIURIL) 25 MG tablet TAKE 1 TABLET (25 MG TOTAL) BY MOUTH DAILY. 90 tablet 2  . hydrOXYzine (ATARAX/VISTARIL) 10 MG tablet Take 10 mg by mouth 3 (three) times daily as needed.    . metroNIDAZOLE (FLAGYL) 500 MG tablet Take 1 tablet (500 mg total) by mouth 3 (three) times daily. 30 tablet 0  . montelukast (SINGULAIR) 10 MG tablet     . Multiple Vitamins-Minerals (MULTIVITAMIN PO) Take by mouth.    . pantoprazole (PROTONIX) 40 MG tablet TAKE 1 TABLET (40 MG TOTAL) BY MOUTH DAILY. 90 tablet 1  . sucralfate (CARAFATE) 1 GM/10ML suspension Take 10 mLs (1 g total) by mouth 4 (four) times daily -  with meals and at bedtime. 420 mL 4  . traMADol (ULTRAM) 50 MG tablet Take 1 tablet (50 mg total) by mouth every 8 (eight) hours as needed. 50 tablet 2  . triamcinolone (KENALOG) 0.1 % paste Use as directed 1 application in the mouth or throat 2 (two) times daily. For one week. 5 g 1  . ipratropium (ATROVENT) 0.06 % nasal spray Place 2 sprays into both nostrils 4 (four) times daily. 15 mL 1  . triamcinolone cream (KENALOG) 0.1 % Apply 1 application topically 2 (two) times daily.  453.611 g 0   No current facility-administered medications for this visit.   Allergies  Allergen Reactions  . Aspirin   . Clindamycin/Lincomycin     Facial swelling  . Cortisporin [Bacitra-Neomycin-Polymyxin-Hc]   . Erythromycin Nausea And Vomiting    Has taken azithromycin with no issues in the past.   . Metronidazole     Rash  . Morphine And Related     Religious reasons  . Nsaids Swelling  . Penicillins Swelling    She denies swelling or hives from penicillin. She states penicillin not effective for infections. Has taken cephalosporins without problems     Exam:  BP 152/92 mmHg  Pulse 98  Ht 5\' 3"  (1.6 m)   Wt 156 lb (70.761 kg)  BMI 27.64 kg/m2  LMP 04/07/2015 Gen: Well NAD HEENT: EOMI,  MMM posterior pharynx with cobblestoning. Normal tympanic membranes bilaterally Lungs: Normal work of breathing. CTABL Heart: RRR no MRG Abd: NABS, Soft. Nondistended, Nontender Exts: Brisk capillary refill, warm and well perfused.  Knees bilaterally normal-appearing with trace effusion. Range of motion is 0-120 bilaterally with trace to 1+ crepitations bilaterally. Stable ligamentous exam bilaterally normal intact extensor and flexor strength  Procedure: Real-time Ultrasound Guided Injection of right knee  Device: GE Logiq E  Verbal informed consent obtained. Discussed risks and benefits of procedure. Warned about infection bleeding damage to structures skin hypopigmentation and fat atrophy among others. Patient expresses understanding and agreement Time-out conducted.  Noted no overlying erythema, induration, or other signs of local infection.  Skin prepped in a sterile fashion.  Local anesthesia: Topical Ethyl chloride.  With sterile technique and under real time ultrasound guidance: 80 mg Kenalog and 4 mL of Marcaine injected easily.  Completed without difficulty  Pain immediately resolved suggesting accurate placement of the medication.  Advised to call if fevers/chills, erythema, induration, drainage, or persistent bleeding.  Images permanently stored and available for review in the ultrasound unit.  Impression: Technically successful ultrasound guided injection.   No results found for this or any previous visit (from the past 24 hour(s)). No results found.   Please see individual assessment and plan sections.

## 2015-06-09 NOTE — Assessment & Plan Note (Addendum)
Doing well. Treat with continued cetirizine. Use Benadryl and Pepcid as needed. Prescribed triamcinolone cream for use as needed. Return as needed.

## 2015-06-09 NOTE — Assessment & Plan Note (Signed)
With postnasal drip. Treat with Atrovent nasal spray.

## 2015-06-09 NOTE — Assessment & Plan Note (Signed)
With exacerbation today. Right knee injected. Follow-up in one week for left knee injection

## 2015-06-09 NOTE — Patient Instructions (Signed)
Thank you for coming in today. Take Benadryl and Pepcid as needed. Use Atrovent nasal spray and triamcinolone cream. Return in one week for left knee injection Call or go to the ER if you develop a large red swollen joint with extreme pain or oozing puss.   Hives Hives are itchy, red, swollen areas of the skin. They can vary in size and location on your body. Hives can come and go for hours or several days (acute hives) or for several weeks (chronic hives). Hives do not spread from person to person (noncontagious). They may get worse with scratching, exercise, and emotional stress. CAUSES   Allergic reaction to food, additives, or drugs.  Infections, including the common cold.  Illness, such as vasculitis, lupus, or thyroid disease.  Exposure to sunlight, heat, or cold.  Exercise.  Stress.  Contact with chemicals. SYMPTOMS   Red or white swollen patches on the skin. The patches may change size, shape, and location quickly and repeatedly.  Itching.  Swelling of the hands, feet, and face. This may occur if hives develop deeper in the skin. DIAGNOSIS  Your caregiver can usually tell what is wrong by performing a physical exam. Skin or blood tests may also be done to determine the cause of your hives. In some cases, the cause cannot be determined. TREATMENT  Mild cases usually get better with medicines such as antihistamines. Severe cases may require an emergency epinephrine injection. If the cause of your hives is known, treatment includes avoiding that trigger.  HOME CARE INSTRUCTIONS   Avoid causes that trigger your hives.  Take antihistamines as directed by your caregiver to reduce the severity of your hives. Non-sedating or low-sedating antihistamines are usually recommended. Do not drive while taking an antihistamine.  Take any other medicines prescribed for itching as directed by your caregiver.  Wear loose-fitting clothing.  Keep all follow-up appointments as directed  by your caregiver. SEEK MEDICAL CARE IF:   You have persistent or severe itching that is not relieved with medicine.  You have painful or swollen joints. SEEK IMMEDIATE MEDICAL CARE IF:   You have a fever.  Your tongue or lips are swollen.  You have trouble breathing or swallowing.  You feel tightness in the throat or chest.  You have abdominal pain. These problems may be the first sign of a life-threatening allergic reaction. Call your local emergency services (911 in [place] MAKE SURE YOU:   Understand these instructions.  Will watch your condition.  Will get help right away if you are not doing well or get worse. Document Released: 10/24/2005 Document Revised: 10/29/2013 Document Reviewed: 01/17/2012 [facility] Patient Information 2015 [place], [place]. This information is not intended to replace advice given to you by your health care provider. Make sure you discuss any questions you have with your health care provider.

## 2015-06-16 ENCOUNTER — Ambulatory Visit (INDEPENDENT_AMBULATORY_CARE_PROVIDER_SITE_OTHER): Payer: Medicare Other | Admitting: Family Medicine

## 2015-06-16 ENCOUNTER — Encounter: Payer: Self-pay | Admitting: Family Medicine

## 2015-06-16 ENCOUNTER — Telehealth: Payer: Self-pay | Admitting: Family Medicine

## 2015-06-16 VITALS — BP 128/73 | HR 89 | Wt 191.0 lb

## 2015-06-16 DIAGNOSIS — M25562 Pain in left knee: Secondary | ICD-10-CM

## 2015-06-16 NOTE — Progress Notes (Signed)
Patient presents to clinic today for scheduled left knee steroid injection.  Procedure: Real-time Ultrasound Guided Injection of left knee  Device: GE Logiq E  Images permanently stored and available for review in the ultrasound unit. Verbal informed consent obtained. Discussed risks and benefits of procedure. Warned about infection bleeding damage to structures skin hypopigmentation and fat atrophy among others. Patient expresses understanding and agreement Time-out conducted.  Noted no overlying erythema, induration, or other signs of local infection.  Skin prepped in a sterile fashion.  Local anesthesia: Topical Ethyl chloride.  With sterile technique and under real time ultrasound guidance: 80mg  Kenalog and 4 mL of Marcaine injected easily.  Completed without difficulty  Pain immediately resolved suggesting accurate placement of the medication.  Advised to call if fevers/chills, erythema, induration, drainage, or persistent bleeding.  Images permanently stored and available for review in the ultrasound unit.  Impression: Technically successful ultrasound guided injection.

## 2015-06-16 NOTE — Telephone Encounter (Signed)
Ok with me 

## 2015-06-16 NOTE — Telephone Encounter (Signed)
Patient states that she talk to [facility] and would like to become a Dr.Corey patient. Just making sure this is okay with Dr.Hommel as well. I will change pcp to [facility] and if there is a problem just let me know.

## 2015-06-16 NOTE — Telephone Encounter (Signed)
OK by me is OK with Dr. Lemmie Evens

## 2015-06-16 NOTE — Patient Instructions (Signed)
Thank you for coming in today.   Call or go to the ER if you develop a large red swollen joint with extreme pain or oozing puss.   

## 2015-06-26 ENCOUNTER — Encounter: Payer: Self-pay | Admitting: Family Medicine

## 2015-06-26 ENCOUNTER — Ambulatory Visit (INDEPENDENT_AMBULATORY_CARE_PROVIDER_SITE_OTHER): Payer: Medicare Other | Admitting: Family Medicine

## 2015-06-26 VITALS — BP 134/84 | HR 87 | Temp 98.4°F | Wt 187.0 lb

## 2015-06-26 DIAGNOSIS — J329 Chronic sinusitis, unspecified: Secondary | ICD-10-CM | POA: Insufficient documentation

## 2015-06-26 DIAGNOSIS — J0110 Acute frontal sinusitis, unspecified: Secondary | ICD-10-CM | POA: Diagnosis not present

## 2015-06-26 MED ORDER — FLUCONAZOLE 150 MG PO TABS: 150.0000 mg | ORAL_TABLET | Freq: Once | ORAL | Status: DC

## 2015-06-26 MED ORDER — AMOXICILLIN-POT CLAVULANATE 875-125 MG PO TABS: 1.0000 | ORAL_TABLET | Freq: Two times a day (BID) | ORAL | Status: DC

## 2015-06-26 NOTE — Assessment & Plan Note (Signed)
Sinusitis. Treat with Augmentin. Continue the counter medicines. Return if not better.

## 2015-06-26 NOTE — Progress Notes (Signed)
Janet Richards is a 54 y.o. female who presents to [facility]  today for right-sided ear pain and pressure associated with mild headache. Patient also notes sinus pressures. Symptoms present for about one week. He she has not tried any medications. No fevers chills nausea vomiting or diarrhea. She absolutely denies any desire to take prednisone. She thinks she has an infection.   Past Medical History  Diagnosis Date  . Urticaria   . Essential hypertension, benign   . Primary osteoarthritis of both knees   . Exertional chest pain   . Menstrual migraine    Past Surgical History  Procedure Laterality Date  . Tubal ligation  12/26/1987   Social History  Substance Use Topics  . Smoking status: Former Smoker -- 1.50 packs/day    Types: Cigarettes    Quit date: 02/05/2006  . Smokeless tobacco: Never Used  . Alcohol Use: Yes     Comment: rarely   ROS as above Medications: Current Outpatient Prescriptions  Medication Sig Dispense Refill  . albuterol (PROVENTIL HFA;VENTOLIN HFA) 108 (90 BASE) MCG/ACT inhaler Inhale two puffs every 4-6 hours only as needed for shortness of breath or wheezing. 1 Inhaler 1  . azelastine (ASTELIN) 0.1 % nasal spray Place 1 spray into both nostrils 2 (two) times daily. Use in each nostril as directed 30 mL 12  . cetirizine HCl (ZYRTEC CHILDRENS ALLERGY) 5 MG/5ML SYRP Take 10 mLs (10 mg total) by mouth daily. 900 mL 0  . cyclobenzaprine (FLEXERIL) 10 MG tablet Take a half to a full tab every 8-12 hours only as needed for muscle spasm, may cause sedation. 45 tablet 0  . hydrochlorothiazide (HYDRODIURIL) 25 MG tablet TAKE 1 TABLET (25 MG TOTAL) BY MOUTH DAILY. 90 tablet 2  . hydrOXYzine (ATARAX/VISTARIL) 10 MG tablet Take 10 mg by mouth 3 (three) times daily as needed.    [name] ipratropium (ATROVENT) 0.06 % nasal spray Place 2 sprays into both nostrils 4 (four) times daily. 15 mL 1  . metroNIDAZOLE (FLAGYL) 500 MG tablet Take 1  tablet (500 mg total) by mouth 3 (three) times daily. 30 tablet 0  . montelukast (SINGULAIR) 10 MG tablet     . Multiple Vitamins-Minerals (MULTIVITAMIN PO) Take by mouth.    . pantoprazole (PROTONIX) 40 MG tablet TAKE 1 TABLET (40 MG TOTAL) BY MOUTH DAILY. 90 tablet 1  . sucralfate (CARAFATE) 1 GM/10ML suspension Take 10 mLs (1 g total) by mouth 4 (four) times daily -  with meals and at bedtime. 420 mL 4  . traMADol (ULTRAM) 50 MG tablet Take 1 tablet (50 mg total) by mouth every 8 (eight) hours as needed. 50 tablet 2  . triamcinolone (KENALOG) 0.1 % paste Use as directed 1 application in the mouth or throat 2 (two) times daily. For one week. 5 g 1  . triamcinolone cream (KENALOG) 0.1 % Apply 1 application topically 2 (two) times daily. 453.611 g 0  . amoxicillin-clavulanate (AUGMENTIN) 875-125 MG per tablet Take 1 tablet by mouth 2 (two) times daily. 14 tablet 0  . fluconazole (DIFLUCAN) 150 MG tablet Take 1 tablet (150 mg total) by mouth once. 1 tablet 1   No current facility-administered medications for this visit.   Allergies  Allergen Reactions  . Aspirin   . Clindamycin/Lincomycin     Facial swelling  . Cortisporin [Bacitra-Neomycin-Polymyxin-Hc]   . Erythromycin Nausea And Vomiting    Has taken azithromycin with no issues in the past.   .  Metronidazole     Rash  . Morphine And Related     Religious reasons  . Nsaids Swelling     Exam:  BP 134/84 mmHg  Pulse 87  Temp(Src) 98.4 F (36.9 C) (Oral)  Wt 187 lb (84.823 kg) Gen: Well NAD HEENT: EOMI,  MMM tympanic membranes are normal appearing bilaterally. Posterior pharynx is normal-appearing. Sinuses are mildly tender bilaterally Lungs: Normal work of breathing. CTABL Heart: RRR no MRG Abd: NABS, Soft. Nondistended, Nontender Exts: Brisk capillary refill, warm and well perfused.   No results found for this or any previous visit (from the past 24 hour(s)). No results found.   Please see individual assessment and plan  sections.

## 2015-06-26 NOTE — Patient Instructions (Signed)
Thank you for coming in today. Take Augmentin twice daily.  Use fluconazole if you have a yeast infection.  We can do the gel knee shots as needed.

## 2015-06-27 ENCOUNTER — Other Ambulatory Visit: Payer: Self-pay | Admitting: Family Medicine

## 2015-07-01 ENCOUNTER — Ambulatory Visit (INDEPENDENT_AMBULATORY_CARE_PROVIDER_SITE_OTHER): Payer: Medicare Other | Admitting: Family Medicine

## 2015-07-01 ENCOUNTER — Encounter: Payer: Self-pay | Admitting: Family Medicine

## 2015-07-01 VITALS — BP 123/81 | HR 86 | Wt 187.0 lb

## 2015-07-01 DIAGNOSIS — R202 Paresthesia of skin: Secondary | ICD-10-CM | POA: Diagnosis not present

## 2015-07-01 DIAGNOSIS — R209 Unspecified disturbances of skin sensation: Secondary | ICD-10-CM | POA: Diagnosis not present

## 2015-07-01 MED ORDER — GABAPENTIN 100 MG PO CAPS: 100.0000 mg | ORAL_CAPSULE | Freq: Three times a day (TID) | ORAL | Status: DC

## 2015-07-01 NOTE — Progress Notes (Signed)
Janet Richards is a 54 y.o. female who presents to [facility]  today for paresthesias. Patient notes one year history of persistent tingling burning pain along the right lateral foot. She denies any injury weakness numbness fevers or chills. In the past she's been told that she has impaired fasting glucose. She notes the symptoms are moderate in bothersome. She's tried some over-the-counter medicines for pain which have not helped. She denies any other radiating pain weakness or numbness. She denies any injury.   Past Medical History  Diagnosis Date  . Urticaria   . Essential hypertension, benign   . Primary osteoarthritis of both knees   . Exertional chest pain   . Menstrual migraine    Past Surgical History  Procedure Laterality Date  . Tubal ligation  12/26/1987   Social History  Substance Use Topics  . Smoking status: Former Smoker -- 1.50 packs/day    Types: Cigarettes    Quit date: 02/05/2006  . Smokeless tobacco: Never Used  . Alcohol Use: Yes     Comment: rarely   family history includes Hypertension in her mother.  ROS as above Medications: Current Outpatient Prescriptions  Medication Sig Dispense Refill  . albuterol (PROVENTIL HFA;VENTOLIN HFA) 108 (90 BASE) MCG/ACT inhaler Inhale two puffs every 4-6 hours only as needed for shortness of breath or wheezing. 1 Inhaler 1  . amoxicillin-clavulanate (AUGMENTIN) 875-125 MG per tablet Take 1 tablet by mouth 2 (two) times daily. 14 tablet 0  . azelastine (ASTELIN) 0.1 % nasal spray Place 1 spray into both nostrils 2 (two) times daily. Use in each nostril as directed 30 mL 12  . cetirizine (ZYRTEC) 1 MG/ML syrup TAKE 10 MLS (10 MG TOTAL) BY MOUTH DAILY. 900 mL 0  . cyclobenzaprine (FLEXERIL) 10 MG tablet Take a half to a full tab every 8-12 hours only as needed for muscle spasm, may cause sedation. 45 tablet 0  . fluconazole (DIFLUCAN) 150 MG tablet Take 1 tablet (150 mg total) by mouth  once. 1 tablet 1  . hydrochlorothiazide (HYDRODIURIL) 25 MG tablet TAKE 1 TABLET (25 MG TOTAL) BY MOUTH DAILY. 90 tablet 2  . hydrOXYzine (ATARAX/VISTARIL) 10 MG tablet Take 10 mg by mouth 3 (three) times daily as needed.    [name] ipratropium (ATROVENT) 0.06 % nasal spray Place 2 sprays into both nostrils 4 (four) times daily. 15 mL 1  . metroNIDAZOLE (FLAGYL) 500 MG tablet Take 1 tablet (500 mg total) by mouth 3 (three) times daily. 30 tablet 0  . montelukast (SINGULAIR) 10 MG tablet     . Multiple Vitamins-Minerals (MULTIVITAMIN PO) Take by mouth.    . pantoprazole (PROTONIX) 40 MG tablet TAKE 1 TABLET (40 MG TOTAL) BY MOUTH DAILY. 90 tablet 1  . sucralfate (CARAFATE) 1 GM/10ML suspension Take 10 mLs (1 g total) by mouth 4 (four) times daily -  with meals and at bedtime. 420 mL 4  . traMADol (ULTRAM) 50 MG tablet Take 1 tablet (50 mg total) by mouth every 8 (eight) hours as needed. 50 tablet 2  . triamcinolone (KENALOG) 0.1 % paste Use as directed 1 application in the mouth or throat 2 (two) times daily. For one week. 5 g 1  . triamcinolone cream (KENALOG) 0.1 % Apply 1 application topically 2 (two) times daily. 453.611 g 0  . gabapentin (NEURONTIN) 100 MG capsule Take 1 capsule (100 mg total) by mouth 3 (three) times daily. 90 capsule 0   No current facility-administered medications  for this visit.   Allergies  Allergen Reactions  . Aspirin   . Clindamycin/Lincomycin     Facial swelling  . Cortisporin [Bacitra-Neomycin-Polymyxin-Hc]   . Erythromycin Nausea And Vomiting    Has taken azithromycin with no issues in the past.   . Metronidazole     Rash  . Morphine And Related     Religious reasons  . Nsaids Swelling     Exam:  BP 123/81 mmHg  Pulse 86  Wt 187 lb (84.823 kg) Gen: Well NAD HEENT: EOMI,  MMM Lungs: Normal work of breathing. CTABL Heart: RRR no MRG Abd: NABS, Soft. Nondistended, Nontender Exts: Brisk capillary refill, warm and well perfused.  Right foot  normal-appearing nontender normal sensation normal pulses capillary refill and sensation  No results found for this or any previous visit (from the past 24 hour(s)). No results found.   Please see individual assessment and plan sections.

## 2015-07-01 NOTE — Assessment & Plan Note (Signed)
Likely due to peripheral neuropathy. Labs including CBC [facility] TSH and B-12 RPR and HIV pending. Discussed testing versus treatment. Patient would like a trial of gabapentin before proceeding with peripheral nerve conduction study. Start gabapentin 100 3 times a day and return in a few weeks

## 2015-07-01 NOTE — Patient Instructions (Signed)
Thank you for coming in today.  Take the gabapentin just at night for a few days. Then take it twice daily for a few days then take it 3 times daily. Return in 2-3 weeks  Paresthesia Paresthesia is an abnormal burning or prickling sensation. This sensation is generally felt in the hands, arms, legs, or feet. However, it may occur in any part of the body. It is usually not painful. The feeling may be described as:  Tingling or numbness.  "Pins and needles."  Skin crawling.  Buzzing.  Limbs "falling asleep."  Itching. Most people experience temporary (transient) paresthesia at some time in their lives. CAUSES  Paresthesia may occur when you breathe too quickly (hyperventilation). It can also occur without any apparent cause. Commonly, paresthesia occurs when pressure is placed on a nerve. The feeling quickly goes away once the pressure is removed. For some people, however, paresthesia is a long-lasting (chronic) condition caused by an underlying disorder. The underlying disorder may be:  A traumatic, direct injury to nerves. Examples include a:  Broken (fractured) neck.  Fractured skull.  A disorder affecting the brain and spinal cord (central nervous system). Examples include:  Transverse myelitis.  Encephalitis.  Transient ischemic attack.  Multiple sclerosis.  Stroke.  Tumor or blood vessel problems, such as an arteriovenous malformation pressing against the brain or spinal cord.  A condition that damages the peripheral nerves (peripheral neuropathy). Peripheral nerves are not part of the brain and spinal cord. These conditions include:  Diabetes.  Peripheral vascular disease.  Nerve entrapment syndromes, such as carpal tunnel syndrome.  Shingles.  Hypothyroidism.  Vitamin B12 deficiencies.  Alcoholism.  Heavy metal poisoning (lead, arsenic).  Rheumatoid arthritis.  Systemic lupus erythematosus. DIAGNOSIS  Your caregiver will attempt to find the  underlying cause of your paresthesia. Your caregiver may:  Take your medical history.  Perform a physical exam.  Order various lab tests.  Order imaging tests. TREATMENT  Treatment for paresthesia depends on the underlying cause. HOME CARE INSTRUCTIONS  Avoid drinking alcohol.  You may consider massage or acupuncture to help relieve your symptoms.  Keep all follow-up appointments as directed by your caregiver. SEEK IMMEDIATE MEDICAL CARE IF:   You feel weak.  You have trouble walking or moving.  You have problems with speech or vision.  You feel confused.  You cannot control your bladder or bowel movements.  You feel numbness after an injury.  You faint.  Your burning or prickling feeling gets worse when walking.  You have pain, cramps, or dizziness.  You develop a rash. MAKE SURE YOU:  Understand these instructions.  Will watch your condition.  Will get help right away if you are not doing well or get worse. Document Released: 10/14/2002 Document Revised: 01/16/2012 Document Reviewed: 07/15/2011 [facility] Patient Information 2015 [place], [place]. This information is not intended to replace advice given to you by your health care provider. Make sure you discuss any questions you have with your health care provider.

## 2015-07-02 LAB — CBC
HCT: 39.2 % (ref 36.0–46.0)
Hemoglobin: 12.9 g/dL (ref 12.0–15.0)
MCH: 29.7 pg (ref 26.0–34.0)
MCHC: 32.9 g/dL (ref 30.0–36.0)
MCV: 90.1 fL (ref 78.0–100.0)
MPV: 8.9 fL (ref 8.6–12.4)
Platelets: 376 10*3/uL (ref 150–400)
RBC: 4.35 MIL/uL (ref 3.87–5.11)
RDW: 15.7 % — ABNORMAL HIGH (ref 11.5–15.5)
WBC: 9.2 10*3/uL (ref 4.0–10.5)

## 2015-07-02 LAB — COMPLETE METABOLIC PANEL WITH GFR
ALT: 25 U/L (ref 6–29)
AST: 17 U/L (ref 10–35)
Albumin: 4.2 g/dL (ref 3.6–5.1)
Alkaline Phosphatase: 86 U/L (ref 33–130)
BUN: 19 mg/dL (ref 7–25)
CO2: 29 mmol/L (ref 20–31)
Calcium: 9.4 mg/dL (ref 8.6–10.4)
Chloride: 98 mmol/L (ref 98–110)
Creat: 0.9 mg/dL (ref 0.50–1.05)
GFR, Est African American: 85 mL/min (ref >=60)
GFR, Est Non African American: 74 mL/min (ref >=60)
Glucose, Bld: 88 mg/dL (ref 65–99)
Potassium: 4.4 mmol/L (ref 3.5–5.3)
Sodium: 140 mmol/L (ref 135–146)
Total Bilirubin: 0.6 mg/dL (ref 0.2–1.2)
Total Protein: 6.8 g/dL (ref 6.1–8.1)

## 2015-07-02 LAB — HIV ANTIBODY (ROUTINE TESTING W REFLEX): HIV 1&2 Ab, 4th Generation: NONREACTIVE

## 2015-07-02 LAB — TSH: TSH: 2.87 u[IU]/mL (ref 0.350–4.500)

## 2015-07-02 LAB — VITAMIN B12: Vitamin B-12: 485 pg/mL (ref 211–911)

## 2015-07-03 NOTE — Progress Notes (Signed)
Quick Note:  Normal, no changes. ______ 

## 2015-07-08 ENCOUNTER — Encounter: Payer: Self-pay | Admitting: Cardiology

## 2015-07-08 ENCOUNTER — Ambulatory Visit (INDEPENDENT_AMBULATORY_CARE_PROVIDER_SITE_OTHER): Payer: Medicare Other | Admitting: Cardiology

## 2015-07-08 VITALS — BP 124/84 | HR 94 | Ht 63.0 in | Wt 185.1 lb

## 2015-07-08 DIAGNOSIS — R072 Precordial pain: Secondary | ICD-10-CM | POA: Diagnosis not present

## 2015-07-08 DIAGNOSIS — I10 Essential (primary) hypertension: Secondary | ICD-10-CM

## 2015-07-08 DIAGNOSIS — E785 Hyperlipidemia, unspecified: Secondary | ICD-10-CM

## 2015-07-08 DIAGNOSIS — R079 Chest pain, unspecified: Secondary | ICD-10-CM | POA: Insufficient documentation

## 2015-07-08 NOTE — Assessment & Plan Note (Signed)
Symptoms are extremely atypical. Occur only when she has dental infection. Last all day and increases with certain movements. Otherwise no exertional symptoms. Electrocardiogram unremarkable. Will not pursue further ischemia evaluation unless she has recurrent symptoms.

## 2015-07-08 NOTE — Assessment & Plan Note (Signed)
Blood pressure controlled. 

## 2015-07-08 NOTE — Patient Instructions (Signed)
Your physician recommends that you schedule a follow-up appointment in: AS NEEDED  

## 2015-07-08 NOTE — Progress Notes (Signed)
HPI: 55 year old female for evaluation of chest pain. Abdominal ultrasound July 2016 showed fatty liver but otherwise negative. Patient states that she has had intermittent chest pain over the past 6 months.it only occurs when she has infection in her teeth. It is under her left breast and described as a sharp squeezing pain. It lasts all day. Increase with inspiration and certain movements. She otherwise denies exertional chest pain, dyspnea on exertion, orthopnea, PND, pedal edema or palpitations or syncope.  Current Outpatient Prescriptions  Medication Sig Dispense Refill  . albuterol (PROVENTIL HFA;VENTOLIN HFA) 108 (90 BASE) MCG/ACT inhaler Inhale two puffs every 4-6 hours only as needed for shortness of breath or wheezing. 1 Inhaler 1  . azelastine (ASTELIN) 0.1 % nasal spray Place 1 spray into both nostrils 2 (two) times daily. Use in each nostril as directed 30 mL 12  . cetirizine (ZYRTEC) 1 MG/ML syrup TAKE 10 MLS (10 MG TOTAL) BY MOUTH DAILY. 900 mL 0  . cyclobenzaprine (FLEXERIL) 10 MG tablet Take a half to a full tab every 8-12 hours only as needed for muscle spasm, may cause sedation. 45 tablet 0  . fluconazole (DIFLUCAN) 150 MG tablet Take 1 tablet (150 mg total) by mouth once. 1 tablet 1  . gabapentin (NEURONTIN) 100 MG capsule Take 1 capsule (100 mg total) by mouth 3 (three) times daily. 90 capsule 0  . hydrochlorothiazide (HYDRODIURIL) 25 MG tablet TAKE 1 TABLET (25 MG TOTAL) BY MOUTH DAILY. 90 tablet 2  . hydrOXYzine (ATARAX/VISTARIL) 10 MG tablet Take 10 mg by mouth 3 (three) times daily as needed for itching.     [name] ipratropium (ATROVENT) 0.06 % nasal spray Place 2 sprays into both nostrils 4 (four) times daily. 15 mL 1  . metroNIDAZOLE (FLAGYL) 500 MG tablet Take 1 tablet (500 mg total) by mouth 3 (three) times daily. 30 tablet 0  . montelukast (SINGULAIR) 10 MG tablet Take 10 mg by mouth at bedtime.     . Multiple Vitamins-Minerals (MULTIVITAMIN PO) Take by mouth.    .  pantoprazole (PROTONIX) 40 MG tablet TAKE 1 TABLET (40 MG TOTAL) BY MOUTH DAILY. 90 tablet 1  . sucralfate (CARAFATE) 1 GM/10ML suspension Take 10 mLs (1 g total) by mouth 4 (four) times daily -  with meals and at bedtime. 420 mL 4  . traMADol (ULTRAM) 50 MG tablet Take 1 tablet (50 mg total) by mouth every 8 (eight) hours as needed. 50 tablet 2  . triamcinolone (KENALOG) 0.1 % paste Use as directed 1 application in the mouth or throat 2 (two) times daily. For one week. 5 g 1  . triamcinolone cream (KENALOG) 0.1 % Apply 1 application topically 2 (two) times daily. 453.611 g 0   No current facility-administered medications for this visit.    Allergies  Allergen Reactions  . Aspirin Rash    Patient indicates increases Urticaria.   . Azithromycin Nausea And Vomiting  . Morphine And Related Rash    Urticaria Religious reasons  . Penicillins Swelling and Other (See Comments)    Patient states does not work for her.  She denies swelling or hives from penicillin. She states penicillin not effective for infections. Has taken cephalosporins without problems  . Clindamycin/Lincomycin     Facial swelling  . Cortisporin [Bacitra-Neomycin-Polymyxin-Hc]   . Erythromycin Nausea And Vomiting    Has taken azithromycin with no issues in the past.   . Metronidazole     Rash  . Nsaids Swelling  Past Medical History  Diagnosis Date  . Urticaria   . Essential hypertension, benign   . Primary osteoarthritis of both knees   . Menstrual migraine     Past Surgical History  Procedure Laterality Date  . Tubal ligation  12/26/1987    Social History   Social History  . Marital Status: Married    Spouse Name: N/A  . Number of Children: 3  . Years of Education: N/A   Occupational History  . homemaker    Social History Main Topics  . Smoking status: Former Smoker -- 1.50 packs/day    Types: Cigarettes    Quit date: 02/05/2006  . Smokeless tobacco: Never Used  . Alcohol Use: Yes      Comment: rarely  . Drug Use: No  . Sexual Activity:    Partners: Male   Other Topics Concern  . Not on file   Social History Narrative    Family History  Problem Relation Age of Onset  . Hypertension Mother   . CAD Father     MI in his 58s    ROS: recent tooth infection but no fevers or chills, productive cough, hemoptysis, dysphasia, odynophagia, melena, hematochezia, dysuria, hematuria, rash, seizure activity, orthopnea, PND, pedal edema, claudication. Remaining systems are negative.  Physical Exam:   Blood pressure 124/84, pulse 94, height 5\' 3"  (1.6 m), weight 185 lb 1.9 oz (83.97 kg).  General:  Well developed/well nourished in NAD Skin warm/dry Patient not depressed No peripheral clubbing Back-normal HEENT-normal/normal eyelids Neck supple/normal carotid upstroke bilaterally; no bruits; no JVD; no thyromegaly chest - CTA/ normal expansion CV - RRR/normal S1 and S2; no murmurs, rubs or gallops;  PMI nondisplaced Abdomen -NT/ND, no HSM, no mass, + bowel sounds, no bruit 2+ femoral pulses, no bruits Ext-no edema, chords, 2+ DP Neuro-grossly nonfocal  ECG sinus rhythm at a rate of 94. Normal axis. No ST changes.

## 2015-07-08 NOTE — Assessment & Plan Note (Signed)
Management per primary care. 

## 2015-07-15 ENCOUNTER — Ambulatory Visit (INDEPENDENT_AMBULATORY_CARE_PROVIDER_SITE_OTHER): Payer: Medicare Other | Admitting: Family Medicine

## 2015-07-15 ENCOUNTER — Encounter: Payer: Self-pay | Admitting: Family Medicine

## 2015-07-15 ENCOUNTER — Ambulatory Visit (INDEPENDENT_AMBULATORY_CARE_PROVIDER_SITE_OTHER): Payer: Medicare Other

## 2015-07-15 VITALS — BP 118/69 | HR 91 | Ht 63.0 in | Wt 190.0 lb

## 2015-07-15 DIAGNOSIS — S8991XA Unspecified injury of right lower leg, initial encounter: Secondary | ICD-10-CM

## 2015-07-15 DIAGNOSIS — R202 Paresthesia of skin: Secondary | ICD-10-CM | POA: Diagnosis not present

## 2015-07-15 DIAGNOSIS — M25561 Pain in right knee: Secondary | ICD-10-CM | POA: Diagnosis not present

## 2015-07-15 DIAGNOSIS — M7989 Other specified soft tissue disorders: Secondary | ICD-10-CM | POA: Diagnosis not present

## 2015-07-15 DIAGNOSIS — M25461 Effusion, right knee: Secondary | ICD-10-CM | POA: Diagnosis not present

## 2015-07-15 DIAGNOSIS — M1711 Unilateral primary osteoarthritis, right knee: Secondary | ICD-10-CM | POA: Diagnosis not present

## 2015-07-15 MED ORDER — DICLOFENAC SODIUM 1 % TD GEL: 4.0000 g | Freq: Four times a day (QID) | TRANSDERMAL | Status: DC

## 2015-07-15 NOTE — Assessment & Plan Note (Signed)
Doing well with gabapentin. Continue daily gabapentin. Return as needed.

## 2015-07-15 NOTE — Assessment & Plan Note (Signed)
Likely prepatellar bursitis versus simple contusion. X-ray pending. Treatment with diclofenac gel. Return if not better.

## 2015-07-15 NOTE — Progress Notes (Addendum)
Janet Richards is a 54 y.o. female who presents to [facility] [facility]: [facility]  today for right knee and follow-up paresthesias and chest pain.  1) right knee pain: Patient fell and landed on her right knee at [place] 5 days ago. She has anterior knee pain. The pain is moderate and improved slightly. Pain is located predominantly at the anterior aspect of the knee and is worse with ambulation. No radiating pain weakness or numbness fevers or chills. No treatment tried yet aside from ice and rest.  2)  paresthesias: Patient had evaluation and treatment for paresthesias recently. She was started on gabapentin. She takes 100 mg at night and feels much better.  3) chest pain: Patient was referred to cardiology for chest pain evaluation. She is pain-free and cardiologist is not concerned.  Past Medical History  Diagnosis Date  . Urticaria   . Essential hypertension, benign   . Primary osteoarthritis of both knees   . Menstrual migraine    Past Surgical History  Procedure Laterality Date  . Tubal ligation  12/26/1987   Social History  Substance Use Topics  . Smoking status: Former Smoker -- 1.50 packs/day    Types: Cigarettes    Quit date: 02/05/2006  . Smokeless tobacco: Never Used  . Alcohol Use: Yes     Comment: rarely   family history includes CAD in her father; Hypertension in her mother.  ROS as above Medications: Current Outpatient Prescriptions  Medication Sig Dispense Refill  . albuterol (PROVENTIL HFA;VENTOLIN HFA) 108 (90 BASE) MCG/ACT inhaler Inhale two puffs every 4-6 hours only as needed for shortness of breath or wheezing. 1 Inhaler 1  . azelastine (ASTELIN) 0.1 % nasal spray Place 1 spray into both nostrils 2 (two) times daily. Use in each nostril as directed 30 mL 12  . cetirizine (ZYRTEC) 1 MG/ML syrup TAKE 10 MLS (10 MG TOTAL) BY MOUTH DAILY. 900 mL 0  . cyclobenzaprine (FLEXERIL) 10 MG tablet Take a half to a full tab every 8-12 hours only as  needed for muscle spasm, may cause sedation. 45 tablet 0  . fluconazole (DIFLUCAN) 150 MG tablet Take 1 tablet (150 mg total) by mouth once. 1 tablet 1  . gabapentin (NEURONTIN) 100 MG capsule Take 1 capsule (100 mg total) by mouth 3 (three) times daily. 90 capsule 0  . hydrochlorothiazide (HYDRODIURIL) 25 MG tablet TAKE 1 TABLET (25 MG TOTAL) BY MOUTH DAILY. 90 tablet 2  . hydrOXYzine (ATARAX/VISTARIL) 10 MG tablet Take 10 mg by mouth 3 (three) times daily as needed for itching.     [name] ipratropium (ATROVENT) 0.06 % nasal spray Place 2 sprays into both nostrils 4 (four) times daily. 15 mL 1  . montelukast (SINGULAIR) 10 MG tablet Take 10 mg by mouth at bedtime.     . Multiple Vitamins-Minerals (MULTIVITAMIN PO) Take by mouth.    . pantoprazole (PROTONIX) 40 MG tablet TAKE 1 TABLET (40 MG TOTAL) BY MOUTH DAILY. 90 tablet 1  . sucralfate (CARAFATE) 1 GM/10ML suspension Take 10 mLs (1 g total) by mouth 4 (four) times daily -  with meals and at bedtime. 420 mL 4  . traMADol (ULTRAM) 50 MG tablet Take 1 tablet (50 mg total) by mouth every 8 (eight) hours as needed. 50 tablet 2  . triamcinolone (KENALOG) 0.1 % paste Use as directed 1 application in the mouth or throat 2 (two) times daily. For one week. 5 g 1  . triamcinolone cream (KENALOG) 0.1 % Apply  1 application topically 2 (two) times daily. 453.611 g 0  . diclofenac sodium (VOLTAREN) 1 % GEL Apply 4 g topically 4 (four) times daily. 100 g 12   No current facility-administered medications for this visit.   Allergies  Allergen Reactions  . Aspirin Rash    Patient indicates increases Urticaria.   . Azithromycin Nausea And Vomiting  . Morphine And Related Rash    Urticaria Religious reasons  . Penicillins Swelling and Other (See Comments)    Patient states does not work for her.  She denies swelling or hives from penicillin. She states penicillin not effective for infections. Has taken cephalosporins without problems  . Clindamycin/Lincomycin      Facial swelling  . Cortisporin [Bacitra-Neomycin-Polymyxin-Hc]   . Erythromycin Nausea And Vomiting    Has taken azithromycin with no issues in the past.   . Metronidazole     Rash  . Nsaids Swelling     Exam:  BP 118/69 mmHg  Pulse 91  Ht 5\' 3"  (1.6 m)  Wt 190 lb (86.183 kg)  BMI 33.67 kg/m2 Gen: Well NAD HEENT: EOMI,  MMM Lungs: Normal work of breathing. CTABL Heart: RRR no MRG Abd: NABS, Soft. Nondistended, Nontender Exts: Brisk capillary refill, warm and well perfused.  Right knee: Normal-appearing. Range of motion 0-120 with 1+ retropatellar crepitations. Tender palpation anterior knee with no masses palpated Stable ligamentous exam. Negative McMurray's test. Normal gait.  No results found for this or any previous visit (from the past 24 hour(s)). No results found.   Please see individual assessment and plan sections.   Addendum: Corrected transcriptional error

## 2015-07-15 NOTE — Patient Instructions (Signed)
Thank you for coming in today. Use Ice as needed.  Apply the Voltaren gel as needed. Get an x-ray today. Return if not better.  Prepatellar Bursitis with Rehab  Bursitis is a condition that is characterized by inflammation of a bursa. Saunders Revel exists in many areas of the body. They are fluid-filled sacs that lie between a soft tissue (skin, tendon, or ligament) and a bone, and they reduce friction between the structures as well as the stress placed on the soft tissue. Prepatellar bursitis is inflammation of the bursa that lies between the skin and the kneecap (patella). This condition often causes pain over the patella. SYMPTOMS   Pain, tenderness, and/or inflammation over the patella.  Pain that worsens with movement of the knee joint.  Decreased range of motion for the knee joint.  A crackling sound (crepitation) when the bursa is moved or touched.  Occasionally, painless swelling of the bursa.  Fever (when infected). CAUSES  Bursitis is caused by damage to the bursa, which results in an inflammatory response. Common mechanisms of injury include:  Direct trauma to the front of the knee.  Repetitive and/or stressful use of the knee. RISK INCREASES WITH:  Activities in which kneeling and/or falling on one's knees is likely (volleyball or football).  Repetitive and stressful training, especially if it involves running on hills.  Improper training techniques, such as a sudden increase in the intensity, frequency, or duration of training.  Failure to warm up properly before activity.  Poor technique.  Artificial turf. PREVENTION   Avoid kneeling or falling on your knees.  Warm up and stretch properly before activity.  Allow for adequate recovery between workouts.  Maintain physical fitness:  Strength, flexibility, and endurance.  Cardiovascular fitness.  Learn and use proper technique. When possible, have a coach correct improper technique.  Wear properly fitted and  padded protective equipment (kneepads). PROGNOSIS  If treated properly, then the symptoms of prepatellar bursitis usually resolve within 2 weeks. RELATED COMPLICATIONS   Recurrent symptoms that result in a chronic problem.  Prolonged healing time, if improperly treated or reinjured.  Limited range of motion.  Infection of bursa.  Chronic inflammation or scarring of bursa. TREATMENT  Treatment initially involves the use of ice and medication to help reduce pain and inflammation. The use of strengthening and stretching exercises may help reduce pain with activity, especially those of the quadriceps and hamstring muscles. These exercises may be performed at home or with referral to a therapist. Your caregiver may recommend kneepads when you return to playing sports, in order to reduce the stress on the prepatellar bursa. If symptoms persist despite treatment, then your caregiver may drain fluid out with a needle (aspirate) the bursa. If symptoms persist for greater than 6 months despite nonsurgical (conservative) treatment, then surgery may be recommended to remove the bursa.  MEDICATION  If pain medication is necessary, then nonsteroidal anti-inflammatory medications, such as aspirin and ibuprofen, or other minor pain relievers, such as acetaminophen, are often recommended.  Do not take pain medication for 7 days before surgery.  Prescription pain relievers may be given if deemed necessary by your caregiver. Use only as directed and only as much as you need.  Corticosteroid injections may be given by your caregiver. These injections should be reserved for the most serious cases, because they may only be given a certain number of times. HEAT AND COLD  Cold treatment (icing) relieves pain and reduces inflammation. Cold treatment should be applied for 10 to 15 minutes  every 2 to 3 hours for inflammation and pain and immediately after any activity that aggravates your symptoms. Use ice packs or  massage the area with a piece of ice (ice massage).  Heat treatment may be used prior to performing the stretching and strengthening activities prescribed by your caregiver, physical therapist, or athletic trainer. Use a heat pack or soak the injury in warm water. SEEK MEDICAL CARE IF:  Treatment seems to offer no benefit, or the condition worsens.  Any medications produce adverse side effects. EXERCISES RANGE OF MOTION (ROM) AND STRETCHING EXERCISES - Prepatellar Bursitis These exercises may help you when beginning to rehabilitate your injury. Your symptoms may resolve with or without further involvement from your physician, physical therapist or athletic trainer. While completing these exercises, remember:   Restoring tissue flexibility helps normal motion to return to the joints. This allows healthier, less painful movement and activity.  An effective stretch should be held for at least 30 seconds.  A stretch should never be painful. You should only feel a gentle lengthening or release in the stretched tissue. STRETCH - Hamstrings, Standing  Stand or sit and extend your right / left leg, placing your foot on a chair or foot stool  Keeping a slight arch in your low back and your hips straight forward.  Lead with your chest and lean forward at the waist until you feel a gentle stretch in the back of your right / left knee or thigh. (When done correctly, this exercise requires leaning only a small distance.)  Hold this position for __________ seconds. Repeat __________ times. Complete this stretch __________ times per day. STRETCH - Quadriceps, Prone   Lie on your stomach on a firm surface, such as a bed or padded floor.  Bend your right / left knee and grasp your ankle. If you are unable to reach, your ankle or pant leg, use a belt around your foot to lengthen your reach.  Gently pull your heel toward your buttocks. Your knee should not slide out to the side. You should feel a  stretch in the front of your thigh and/or knee.  Hold this position for __________ seconds. Repeat __________ times. Complete this stretch __________ times per day.  STRETCH - Hamstrings/Adductors, V-Sit   Sit on the floor with your legs extended in a large "V," keeping your knees straight.  With your head and chest upright, bend at your waist reaching for your right foot to stretch your left adductors.  You should feel a stretch in your left inner thigh. Hold for __________ seconds.  Return to the upright position to relax your leg muscles.  Continuing to keep your chest upright, bend straight forward at your waist to stretch your hamstrings.  You should feel a stretch behind both of your thighs and/or knees. Hold for __________ seconds.  Return to the upright position to relax your leg muscles.  Repeat steps 2 through 4. Repeat __________ times. Complete this exercise __________ times per day.  STRENGTHENING EXERCISES - Prepatellar Bursitis  These exercises may help you when beginning to rehabilitate your injury. They may resolve your symptoms with or without further involvement from your physician, physical therapist or athletic trainer. While completing these exercises, remember:  Muscles can gain both the endurance and the strength needed for everyday activities through controlled exercises.  Complete these exercises as instructed by your physician, physical therapist or athletic trainer. Progress the resistance and repetitions only as guided. STRENGTH - Quadriceps, Isometrics  Lie on your  back with your right / left leg extended and your opposite knee bent.  Gradually tense the muscles in the front of your right / left thigh. You should see either your kneecap slide up toward your hip or increased dimpling just above the knee. This motion will push the back of the knee down toward the floor/mat/bed on which you are lying.  Hold the muscle as tight as you can without  increasing your pain for __________ seconds.  Relax the muscles slowly and completely in between each repetition. Repeat __________ times. Complete this exercise __________ times per day.  STRENGTH - Quadriceps, Short Arcs   Lie on your back. Place a __________ inch towel roll under your knee so that the knee slightly bends.  Raise only your lower leg by tightening the muscles in the front of your thigh. Do not allow your thigh to rise.  Hold this position for __________ seconds. Repeat __________ times. Complete this exercise __________ times per day.  OPTIONAL ANKLE WEIGHTS: Begin with ____________________, but DO NOT exceed ____________________. Increase in1 lb/0.5 kg increments.  STRENGTH - Quadriceps, Straight Leg Raises  Quality counts! Watch for signs that the quadriceps muscle is working to insure you are strengthening the correct muscles and not "cheating" by substituting with healthier muscles.  Lay on your back with your right / left leg extended and your opposite knee bent.  Tense the muscles in the front of your right / left thigh. You should see either your kneecap slide up or increased dimpling just above the knee. Your thigh may even quiver.  Tighten these muscles even more and raise your leg 4 to 6 inches off the floor. Hold for __________ seconds.  Keeping these muscles tense, lower your leg.  Relax the muscles slowly and completely in between each repetition. Repeat __________ times. Complete this exercise __________ times per day.  STRENGTH - Quadriceps, Step-Ups   Use a thick book, step or step stool that is __________ inches tall.  Holding a wall or counter for balance only, not support.  Slowly step-up with your right / left foot, keeping your knee in line with your hip and foot. Do not allow your knee to bend so far that you cannot see your toes.  Slowly unlock your knee and lower yourself to the starting position. Your muscles, not gravity, should lower  you. Repeat __________ times. Complete this exercise __________ times per day. Document Released: 10/24/2005 Document Revised: 03/10/2014 Document Reviewed: 02/05/2009 [facility] Patient Information 2015 [place], [place]. This information is not intended to replace advice given to you by your health care provider. Make sure you discuss any questions you have with your health care provider.

## 2015-07-16 ENCOUNTER — Ambulatory Visit: Payer: Self-pay | Admitting: Family Medicine

## 2015-07-16 NOTE — Progress Notes (Signed)
Quick Note:  Xray does not shoe any broken bones. This knee should get better. ______

## 2015-08-04 ENCOUNTER — Ambulatory Visit (INDEPENDENT_AMBULATORY_CARE_PROVIDER_SITE_OTHER): Payer: Medicare Other | Admitting: Obstetrics & Gynecology

## 2015-08-04 ENCOUNTER — Encounter: Payer: Self-pay | Admitting: Obstetrics & Gynecology

## 2015-08-04 VITALS — BP 125/75 | HR 85 | Resp 16 | Ht 63.0 in | Wt 190.0 lb

## 2015-08-04 DIAGNOSIS — R350 Frequency of micturition: Secondary | ICD-10-CM

## 2015-08-04 DIAGNOSIS — N921 Excessive and frequent menstruation with irregular cycle: Secondary | ICD-10-CM

## 2015-08-04 DIAGNOSIS — N898 Other specified noninflammatory disorders of vagina: Secondary | ICD-10-CM

## 2015-08-05 ENCOUNTER — Telehealth: Payer: Self-pay | Admitting: *Deleted

## 2015-08-05 ENCOUNTER — Encounter: Payer: Self-pay | Admitting: Obstetrics & Gynecology

## 2015-08-05 ENCOUNTER — Encounter: Payer: Self-pay | Admitting: Family Medicine

## 2015-08-05 ENCOUNTER — Ambulatory Visit (INDEPENDENT_AMBULATORY_CARE_PROVIDER_SITE_OTHER): Payer: Medicare Other | Admitting: Family Medicine

## 2015-08-05 VITALS — BP 126/79 | HR 98 | Ht 63.0 in | Wt 189.0 lb

## 2015-08-05 DIAGNOSIS — K047 Periapical abscess without sinus: Secondary | ICD-10-CM | POA: Diagnosis not present

## 2015-08-05 DIAGNOSIS — S8991XD Unspecified injury of right lower leg, subsequent encounter: Secondary | ICD-10-CM | POA: Diagnosis not present

## 2015-08-05 DIAGNOSIS — R202 Paresthesia of skin: Secondary | ICD-10-CM | POA: Diagnosis not present

## 2015-08-05 LAB — CBC
HCT: 37.9 % (ref 36.0–46.0)
Hemoglobin: 12.4 g/dL (ref 12.0–15.0)
MCH: 29.2 pg (ref 26.0–34.0)
MCHC: 32.7 g/dL (ref 30.0–36.0)
MCV: 89.2 fL (ref 78.0–100.0)
MPV: 9.3 fL (ref 8.6–12.4)
Platelets: 514 10*3/uL — ABNORMAL HIGH (ref 150–400)
RBC: 4.25 MIL/uL (ref 3.87–5.11)
RDW: 15.5 % (ref 11.5–15.5)
WBC: 8.3 10*3/uL (ref 4.0–10.5)

## 2015-08-05 LAB — WET PREP FOR TRICH, YEAST, CLUE
Clue Cells Wet Prep HPF POC: NONE SEEN
Trich, Wet Prep: NONE SEEN
WBC, Wet Prep HPF POC: NONE SEEN
Yeast Wet Prep HPF POC: NONE SEEN

## 2015-08-05 MED ORDER — CEFUROXIME AXETIL 500 MG PO TABS: 500.0000 mg | ORAL_TABLET | Freq: Two times a day (BID) | ORAL | Status: DC

## 2015-08-05 NOTE — Telephone Encounter (Signed)
Pt notified of normal wet prep results

## 2015-08-05 NOTE — Addendum Note (Signed)
Addended by: [name] on: 08/05/2015 01:30 PM   Modules accepted: Miquel Dunn

## 2015-08-05 NOTE — Assessment & Plan Note (Signed)
resolved 

## 2015-08-05 NOTE — Progress Notes (Signed)
Janet Richards is a 54 y.o. female who presents to [facility]  today for facial pain and infection. Patient notes about a week long history of bilateral upper face pain and pressure. She states her symptoms are consistent with either an ear infection or a dental infection. She attributes her current symptoms to her recent history of menstrual bleeding for which she is being seen by OB/GYN. She notes that she has an appointment on Monday for a dental extraction.  Patient additionally notes that she was seen earlier this month for right knee pain. This is feeling much better.   Additionally patient notes that she was seen earlier for neuropathy which is currently well controlled with gabapentin.  Past Medical History  Diagnosis Date  . Urticaria   . Essential hypertension, benign   . Primary osteoarthritis of both knees   . Menstrual migraine    Past Surgical History  Procedure Laterality Date  . Tubal ligation  12/26/1987   Social History  Substance Use Topics  . Smoking status: Former Smoker -- 1.50 packs/day    Types: Cigarettes    Quit date: 02/05/2006  . Smokeless tobacco: Never Used  . Alcohol Use: Yes     Comment: rarely   family history includes CAD in her father; Hypertension in her mother.  ROS as above Medications: Current Outpatient Prescriptions  Medication Sig Dispense Refill  . albuterol (PROVENTIL HFA;VENTOLIN HFA) 108 (90 BASE) MCG/ACT inhaler Inhale two puffs every 4-6 hours only as needed for shortness of breath or wheezing. 1 Inhaler 1  . azelastine (ASTELIN) 0.1 % nasal spray Place 1 spray into both nostrils 2 (two) times daily. Use in each nostril as directed 30 mL 12  . cetirizine (ZYRTEC) 1 MG/ML syrup TAKE 10 MLS (10 MG TOTAL) BY MOUTH DAILY. 900 mL 0  . cyclobenzaprine (FLEXERIL) 10 MG tablet Take a half to a full tab every 8-12 hours only as needed for muscle spasm, may cause sedation. 45 tablet 0  . diclofenac  sodium (VOLTAREN) 1 % GEL Apply 4 g topically 4 (four) times daily. 100 g 12  . gabapentin (NEURONTIN) 100 MG capsule Take 1 capsule (100 mg total) by mouth 3 (three) times daily. 90 capsule 0  . hydrochlorothiazide (HYDRODIURIL) 25 MG tablet TAKE 1 TABLET (25 MG TOTAL) BY MOUTH DAILY. 90 tablet 2  . hydrOXYzine (ATARAX/VISTARIL) 10 MG tablet Take 10 mg by mouth 3 (three) times daily as needed for itching.     [name] ipratropium (ATROVENT) 0.06 % nasal spray Place 2 sprays into both nostrils 4 (four) times daily. 15 mL 1  . montelukast (SINGULAIR) 10 MG tablet Take 10 mg by mouth at bedtime.     . Multiple Vitamins-Minerals (MULTIVITAMIN PO) Take by mouth.    . pantoprazole (PROTONIX) 40 MG tablet TAKE 1 TABLET (40 MG TOTAL) BY MOUTH DAILY. 90 tablet 1  . sucralfate (CARAFATE) 1 GM/10ML suspension Take 10 mLs (1 g total) by mouth 4 (four) times daily -  with meals and at bedtime. 420 mL 4  . traMADol (ULTRAM) 50 MG tablet Take 1 tablet (50 mg total) by mouth every 8 (eight) hours as needed. 50 tablet 2  . triamcinolone (KENALOG) 0.1 % paste Use as directed 1 application in the mouth or throat 2 (two) times daily. For one week. 5 g 1  . triamcinolone cream (KENALOG) 0.1 % Apply 1 application topically 2 (two) times daily. 453.611 g 0  . cefUROXime (CEFTIN) 500  MG tablet Take 1 tablet (500 mg total) by mouth 2 (two) times daily with a meal. 14 tablet 0   No current facility-administered medications for this visit.   Allergies  Allergen Reactions  . Aspirin Rash    Patient indicates increases Urticaria.   . Azithromycin Nausea And Vomiting  . Morphine And Related Rash    Urticaria Religious reasons  . Clindamycin/Lincomycin     Facial swelling  . Cortisporin [Bacitra-Neomycin-Polymyxin-Hc]   . Erythromycin Nausea And Vomiting    Has taken azithromycin with no issues in the past.   . Metronidazole     Rash  . Nsaids Swelling     Exam:  BP 126/79 mmHg  Pulse 98  Ht 5\' 3"  (1.6 m)  Wt 189  lb (85.73 kg)  BMI 33.49 kg/m2  LMP 07/15/2015 Gen: Well NAD HEENT: EOMI,  MMM normal tympanic membranes bilaterally. Nontender maxillary and frontal sinuses. Upper left tooth eroded to get him with surrounding erythema. Poor dentition throughout. No cervical lymphadenopathy present. Lungs: Normal work of breathing. CTABL Heart: RRR no MRG Abd: NABS, Soft. Nondistended, Nontender Exts: Brisk capillary refill, warm and well perfused.   Results for orders placed or performed in visit on 08/04/15 (from the past 24 hour(s))  CBC     Status: Abnormal   Collection Time: 08/04/15  4:03 PM  Result Value Ref Range   WBC 8.3 4.0 - 10.5 K/uL   RBC 4.25 3.87 - 5.11 MIL/uL   Hemoglobin 12.4 12.0 - 15.0 g/dL   HCT 37.9 36.0 - 46.0 %   MCV 89.2 78.0 - 100.0 fL   MCH 29.2 26.0 - 34.0 pg   MCHC 32.7 30.0 - 36.0 g/dL   RDW 15.5 11.5 - 15.5 %   Platelets 514 (H) 150 - 400 K/uL   MPV 9.3 8.6 - 12.4 fL   Narrative   Performed at:  [address], [address]                [place], [place] [address]  Urinalysis     Status: None (Preliminary result)   Collection Time: 08/04/15  4:03 PM  Result Value Ref Range   Color, Urine  YELLOW   APPearance  CLEAR   Specific Gravity, Urine  1.001 - 1.035   pH  5.0 - 8.0   Glucose, UA  NEGATIVE   Bilirubin Urine  NEGATIVE   Ketones, ur  NEGATIVE   Hgb urine dipstick  NEGATIVE   Protein, ur  NEGATIVE   Nitrite  NEGATIVE   Leukocytes, UA  NEGATIVE   Narrative   Performed at:  [address]                [place], [place] [address]  WET PREP FOR [place], YEAST, CLUE     Status: None   Collection Time: 08/04/15  4:05 PM  Result Value Ref Range   Yeast Wet Prep HPF POC NONE SEEN NONE SEEN   Trich, Wet Prep NONE SEEN NONE SEEN   Clue Cells Wet Prep HPF POC NONE SEEN NONE SEEN   WBC, Wet Prep HPF POC NONE SEEN NONE SEEN   Narrative   Performed at:  [facility]  [address]                [place], [place] [address]   No results found.   Please see individual assessment and plan sections.

## 2015-08-05 NOTE — Assessment & Plan Note (Signed)
Doing well. Treat with Ceftin follow-up with dentist.

## 2015-08-05 NOTE — Assessment & Plan Note (Signed)
Doing well continue gabapentin

## 2015-08-05 NOTE — Patient Instructions (Signed)
Thank you for coming in today. Take ceftin twice daily.  Return in 1 month.  Call or go to the emergency room if you get worse, have trouble breathing, have chest pains, or palpitations.   Work on getting the bad teeth pulled.   Abscessed Tooth An abscessed tooth is an infection around your tooth. It may be caused by holes or damage to the tooth (cavity) or a dental disease. An abscessed tooth causes mild to very bad pain in and around the tooth. See your dentist right away if you have tooth or gum pain. HOME CARE  Take your medicine as told. Finish it even if you start to feel better.  Do not drive after taking pain medicine.  Rinse your mouth (gargle) often with salt water ( teaspoon salt in 8 ounces of warm water).  Do not apply heat to the outside of your face. GET HELP RIGHT AWAY IF:   You have a temperature by mouth above 102 F (38.9 C), not controlled by medicine.  You have chills and a very bad headache.  You have problems breathing or swallowing.  Your mouth will not open.  You develop puffiness (swelling) on the neck or around the eye.  Your pain is not helped by medicine.  Your pain is getting worse instead of better. MAKE SURE YOU:   Understand these instructions.  Will watch your condition.  Will get help right away if you are not doing well or get worse. Document Released: 04/11/2008 Document Revised: 01/16/2012 Document Reviewed: 02/01/2011 [facility] Patient Information 2015 [place], [place]. This information is not intended to replace advice given to you by your health care provider. Make sure you discuss any questions you have with your health care provider.

## 2015-08-05 NOTE — Progress Notes (Signed)
   Subjective:    Patient ID: Janet Richards, female    DOB: 1961-05-20, 54 y.o.   MRN: [id number]  HPI  Pt presents c/o vaginal bleeding.  Pt had work up one year ago for bleeding.   US showed 10/22/14  Uterus  Measurements: 8.5 x 5.7 x 5.8 cm. Retroverted. Heterogeneous echotexture of uterine myometrium noted. No distinct fibroids visualized.  Endometrium  Thickness: 5 mm. No focal abnormality visualized.  Right ovary  Measurements: Not directly visualized by transabdominal or transvaginal sonography, however no adnexal mass identified.  Left ovary  Measurements: 2.9 x 2.3 x 2.3 cm. Normal appearance/no adnexal mass.  Other findings  No free fluid.  IMPRESSION: Retroverted uterus. Heterogeneous myometrial echotexture, without definite fibroids.  Normal appearance of left ovary. Nonvisualization of right ovary, however no adnexal mass identified  Endometrial biopsy 06/12/14 Endometrium, biopsy - SECRETORY ENDOMETRIUM. NO EVIDENCE OF HYPERPLASIA OR CARCINOMA  Pt stopped Megace in August 2015.  She started bleeding again in 2016 and self restarted Megace.  It helped some but had a long menses this past month.  Pt believes it is her tooth abscess that is causing the bleeding.  Pt denies associated symptoms--no pain. Pt admits to extreme urinary frequency (she urinated 4 times in 1 hour in our office.  Last void was measured and 15 cc with urge of 5/10 to use the bathroom.  Nothing other than Megace has helped.  Pt has some c/o vaginal odor.  Review of Systems  Constitutional: Positive for fatigue.  HENT: Positive for dental problem and facial swelling.   Eyes: Negative.   Cardiovascular: Negative.   Gastrointestinal: Positive for abdominal distention.  Genitourinary: Positive for frequency, vaginal bleeding and menstrual problem. Negative for dysuria.  Musculoskeletal: Negative.   Neurological: Negative for light-headedness.   PMHX< PSX, PFAM hx,  social history reveiwed.    Objective:   Physical Exam  Constitutional: She is oriented to person, place, and time. She appears well-developed and well-nourished. No distress.  HENT:  Head: Normocephalic and atraumatic.  Poor dentition with left upper gum swelling  Eyes: Conjunctivae are normal.  Neck: Neck supple. No thyromegaly present.  Cardiovascular: Normal rate.   Pulmonary/Chest: Effort normal.  Abdominal: Soft. Bowel sounds are normal. She exhibits no distension and no mass. There is no tenderness. There is no rebound and no guarding.  Genitourinary: Vagina normal.  Ext Gen:  Tanner V Vagina:  Pink, nml rugae Cervix:  Closed, no CMT Uterus and adnexa difficult to assess due to body habitus.  Musculoskeletal: She exhibits no edema.  Neurological: She is alert and oriented to person, place, and time.  Skin: Skin is warm and dry.  Psychiatric: She has a normal mood and affect.  Vitals reviewed.         Assessment & Plan:  54 yo female with menometrorrhagia  1-TVUS 2-Saline sono hyst to look for structural abnormalities 3-Check FSH when off Megace 4-TSH and HGB are nml 5-mammogram next week 6-post poid residual, UA, U cx 7-Wet prep for c/o discharge 8-UPT

## 2015-08-06 LAB — CULTURE, URINE COMPREHENSIVE
Colony Count: NO GROWTH
Organism ID, Bacteria: NO GROWTH

## 2015-08-11 ENCOUNTER — Encounter: Payer: Self-pay | Admitting: Family Medicine

## 2015-08-11 ENCOUNTER — Ambulatory Visit (INDEPENDENT_AMBULATORY_CARE_PROVIDER_SITE_OTHER): Payer: Medicare Other | Admitting: Family Medicine

## 2015-08-11 VITALS — BP 136/91 | HR 88 | Wt 188.0 lb

## 2015-08-11 DIAGNOSIS — K047 Periapical abscess without sinus: Secondary | ICD-10-CM | POA: Diagnosis not present

## 2015-08-11 MED ORDER — CIPROFLOXACIN HCL 500 MG PO TABS: 500.0000 mg | ORAL_TABLET | Freq: Two times a day (BID) | ORAL | Status: DC

## 2015-08-11 NOTE — Patient Instructions (Signed)
Thank you for coming in today. Return as directed.  Take the cipro.  Follow up with the dentist ASAP to remove your tooth.

## 2015-08-11 NOTE — Assessment & Plan Note (Signed)
Witch to Cipro. Follow-up with dentist ASAP

## 2015-08-11 NOTE — Progress Notes (Signed)
Janet Richards is a 54 y.o. female who presents to [facility]  today for continued mouth pain. Patient notes continued mouth discomfort and pain. She was seen recently for dental pain and prescribe Ceftin. She notes over the weekend she develops some jitteriness and noted her blood pressure was systolic 532Y. She stopped taking the medicine and is feeling a bit better now. She has her dental pain is still persistent. She was seen by her dentist yesterday but did not have a dental extraction because she was not feeling well. She's tolerated Cipro well.   Past Medical History  Diagnosis Date  . Urticaria   . Essential hypertension, benign   . Primary osteoarthritis of both knees   . Menstrual migraine    Past Surgical History  Procedure Laterality Date  . Tubal ligation  12/26/1987   Social History  Substance Use Topics  . Smoking status: Former Smoker -- 1.50 packs/day    Types: Cigarettes    Quit date: 02/05/2006  . Smokeless tobacco: Never Used  . Alcohol Use: Yes     Comment: rarely   family history includes CAD in her father; Hypertension in her mother.  ROS as above Medications: Current Outpatient Prescriptions  Medication Sig Dispense Refill  . albuterol (PROVENTIL HFA;VENTOLIN HFA) 108 (90 BASE) MCG/ACT inhaler Inhale two puffs every 4-6 hours only as needed for shortness of breath or wheezing. 1 Inhaler 1  . azelastine (ASTELIN) 0.1 % nasal spray Place 1 spray into both nostrils 2 (two) times daily. Use in each nostril as directed 30 mL 12  . cetirizine (ZYRTEC) 1 MG/ML syrup TAKE 10 MLS (10 MG TOTAL) BY MOUTH DAILY. 900 mL 0  . cyclobenzaprine (FLEXERIL) 10 MG tablet Take a half to a full tab every 8-12 hours only as needed for muscle spasm, may cause sedation. 45 tablet 0  . diclofenac sodium (VOLTAREN) 1 % GEL Apply 4 g topically 4 (four) times daily. 100 g 12  . gabapentin (NEURONTIN) 100 MG capsule Take 1 capsule (100 mg total) by  mouth 3 (three) times daily. 90 capsule 0  . hydrochlorothiazide (HYDRODIURIL) 25 MG tablet TAKE 1 TABLET (25 MG TOTAL) BY MOUTH DAILY. 90 tablet 2  . hydrOXYzine (ATARAX/VISTARIL) 10 MG tablet Take 10 mg by mouth 3 (three) times daily as needed for itching.     [name] ipratropium (ATROVENT) 0.06 % nasal spray Place 2 sprays into both nostrils 4 (four) times daily. 15 mL 1  . montelukast (SINGULAIR) 10 MG tablet Take 10 mg by mouth at bedtime.     . Multiple Vitamins-Minerals (MULTIVITAMIN PO) Take by mouth.    . pantoprazole (PROTONIX) 40 MG tablet TAKE 1 TABLET (40 MG TOTAL) BY MOUTH DAILY. 90 tablet 1  . sucralfate (CARAFATE) 1 GM/10ML suspension Take 10 mLs (1 g total) by mouth 4 (four) times daily -  with meals and at bedtime. 420 mL 4  . traMADol (ULTRAM) 50 MG tablet Take 1 tablet (50 mg total) by mouth every 8 (eight) hours as needed. 50 tablet 2  . triamcinolone (KENALOG) 0.1 % paste Use as directed 1 application in the mouth or throat 2 (two) times daily. For one week. 5 g 1  . triamcinolone cream (KENALOG) 0.1 % Apply 1 application topically 2 (two) times daily. 453.611 g 0  . ciprofloxacin (CIPRO) 500 MG tablet Take 1 tablet (500 mg total) by mouth 2 (two) times daily. 14 tablet 0   No current facility-administered medications  for this visit.   Allergies  Allergen Reactions  . Aspirin Rash    Patient indicates increases Urticaria.   . Azithromycin Nausea And Vomiting  . Morphine And Related Rash    Urticaria Religious reasons  . Clindamycin/Lincomycin     Facial swelling  . Cortisporin [Bacitra-Neomycin-Polymyxin-Hc]   . Erythromycin Nausea And Vomiting    Has taken azithromycin with no issues in the past.   . Metronidazole     Rash  . Nsaids Swelling     Exam:  BP 136/91 mmHg  Pulse 88  Wt 188 lb (85.276 kg)  LMP 07/15/2015 Gen: Well NAD HEENT: EOMI,  MMM Lungs: Normal work of breathing. CTABL Heart: RRR no MRG Abd: NABS, Soft. Nondistended, Nontender Exts: Brisk  capillary refill, warm and well perfused.   No results found for this or any previous visit (from the past 24 hour(s)). No results found.   Please see individual assessment and plan sections.

## 2015-08-12 ENCOUNTER — Ambulatory Visit (INDEPENDENT_AMBULATORY_CARE_PROVIDER_SITE_OTHER): Payer: Medicare Other

## 2015-08-12 ENCOUNTER — Ambulatory Visit: Payer: Self-pay | Admitting: Family Medicine

## 2015-08-12 DIAGNOSIS — Z1231 Encounter for screening mammogram for malignant neoplasm of breast: Secondary | ICD-10-CM

## 2015-08-12 DIAGNOSIS — Z1239 Encounter for other screening for malignant neoplasm of breast: Secondary | ICD-10-CM

## 2015-08-12 DIAGNOSIS — Z0000 Encounter for general adult medical examination without abnormal findings: Secondary | ICD-10-CM

## 2015-08-19 ENCOUNTER — Other Ambulatory Visit: Payer: Self-pay | Admitting: Obstetrics & Gynecology

## 2015-08-19 ENCOUNTER — Ambulatory Visit (HOSPITAL_COMMUNITY)
Admission: RE | Admit: 2015-08-19 | Discharge: 2015-08-19 | Disposition: A | Discharge status: Home / Self Care | Payer: Medicare Other | Source: Ambulatory Visit | Attending: Obstetrics & Gynecology | Admitting: Obstetrics & Gynecology

## 2015-08-19 DIAGNOSIS — N921 Excessive and frequent menstruation with irregular cycle: Secondary | ICD-10-CM

## 2015-08-19 DIAGNOSIS — N939 Abnormal uterine and vaginal bleeding, unspecified: Secondary | ICD-10-CM | POA: Diagnosis not present

## 2015-08-19 DIAGNOSIS — N854 Malposition of uterus: Secondary | ICD-10-CM | POA: Insufficient documentation

## 2015-08-24 ENCOUNTER — Telehealth: Payer: Self-pay | Admitting: *Deleted

## 2015-08-24 NOTE — Telephone Encounter (Signed)
-----   Message from Guss Bunde, MD sent at 08/21/2015  2:13 PM EDT ----- Nml hemaglobin US shows no polp nor fibroid.  No treatment necessary at this time.  Last biopsy was 06/2014.  She should come back to see if better and if not, needs rpt endometrial biopsy.  RN to call and notate in Epic.

## 2015-08-24 NOTE — Telephone Encounter (Signed)
Pt notified of neg U/S and she is aware that if she is not getting better she will call office for repeat endo BX

## 2015-09-01 ENCOUNTER — Encounter: Payer: Self-pay | Admitting: Family Medicine

## 2015-09-01 ENCOUNTER — Ambulatory Visit (INDEPENDENT_AMBULATORY_CARE_PROVIDER_SITE_OTHER): Payer: Medicare Other | Admitting: Family Medicine

## 2015-09-01 ENCOUNTER — Ambulatory Visit (INDEPENDENT_AMBULATORY_CARE_PROVIDER_SITE_OTHER): Payer: Medicare Other

## 2015-09-01 VITALS — BP 125/99 | HR 88 | Wt 192.0 lb

## 2015-09-01 DIAGNOSIS — K047 Periapical abscess without sinus: Secondary | ICD-10-CM

## 2015-09-01 DIAGNOSIS — M542 Cervicalgia: Secondary | ICD-10-CM

## 2015-09-01 DIAGNOSIS — M47812 Spondylosis without myelopathy or radiculopathy, cervical region: Secondary | ICD-10-CM | POA: Diagnosis not present

## 2015-09-01 MED ORDER — PANTOPRAZOLE SODIUM 40 MG PO TBEC: DELAYED_RELEASE_TABLET | ORAL | Status: DC

## 2015-09-01 MED ORDER — CIPROFLOXACIN HCL 500 MG PO TABS: 500.0000 mg | ORAL_TABLET | Freq: Two times a day (BID) | ORAL | Status: DC

## 2015-09-01 NOTE — Assessment & Plan Note (Signed)
Refill Cipro. Strongly encourage patient to get dental extraction.

## 2015-09-01 NOTE — Patient Instructions (Signed)
Thank you for coming in today. Call or go to the emergency room if you get worse, have trouble breathing, have chest pains, or palpitations.  Come back or go to the emergency room if you notice new weakness new numbness problems walking or bowel or bladder problems. Get the xray.  Get the tooth removed.   Return as needed.

## 2015-09-01 NOTE — Assessment & Plan Note (Signed)
Cervical myofascial pain likely. X-ray pending. Refer to physical therapy if not getting better.

## 2015-09-01 NOTE — Progress Notes (Signed)
Janet Richards is a 54 y.o. female who presents to [facility]  today for tooth pain and neck pain.  1) patient has continued bilateral upper tooth pain. She was unable to get a dental extraction due to various reasons including financial. She requests refill of Cipro as her pain has returned as the infection has returned. The Cipro worked well last time.  2) additionally patient has new central upper back low neck pain. Pain present for about 2 weeks without recent injury. No weakness or numbness radiating pain. No fevers or chills.   Past Medical History  Diagnosis Date  . Urticaria   . Essential hypertension, benign   . Primary osteoarthritis of both knees   . Menstrual migraine    Past Surgical History  Procedure Laterality Date  . Tubal ligation  12/26/1987   Social History  Substance Use Topics  . Smoking status: Former Smoker -- 1.50 packs/day    Types: Cigarettes    Quit date: 02/05/2006  . Smokeless tobacco: Never Used  . Alcohol Use: Yes     Comment: rarely   family history includes CAD in her father; Hypertension in her mother.  ROS as above Medications: Current Outpatient Prescriptions  Medication Sig Dispense Refill  . albuterol (PROVENTIL HFA;VENTOLIN HFA) 108 (90 BASE) MCG/ACT inhaler Inhale two puffs every 4-6 hours only as needed for shortness of breath or wheezing. 1 Inhaler 1  . azelastine (ASTELIN) 0.1 % nasal spray Place 1 spray into both nostrils 2 (two) times daily. Use in each nostril as directed 30 mL 12  . cetirizine (ZYRTEC) 1 MG/ML syrup TAKE 10 MLS (10 MG TOTAL) BY MOUTH DAILY. 900 mL 0  . cyclobenzaprine (FLEXERIL) 10 MG tablet Take a half to a full tab every 8-12 hours only as needed for muscle spasm, may cause sedation. 45 tablet 0  . gabapentin (NEURONTIN) 100 MG capsule Take 1 capsule (100 mg total) by mouth 3 (three) times daily. 90 capsule 0  . hydrochlorothiazide (HYDRODIURIL) 25 MG tablet TAKE 1 TABLET  (25 MG TOTAL) BY MOUTH DAILY. 90 tablet 2  . hydrOXYzine (ATARAX/VISTARIL) 10 MG tablet Take 10 mg by mouth 3 (three) times daily as needed for itching.     [name] ipratropium (ATROVENT) 0.06 % nasal spray Place 2 sprays into both nostrils 4 (four) times daily. 15 mL 1  . montelukast (SINGULAIR) 10 MG tablet Take 10 mg by mouth at bedtime.     . Multiple Vitamins-Minerals (MULTIVITAMIN PO) Take by mouth.    . pantoprazole (PROTONIX) 40 MG tablet TAKE 1 TABLET (40 MG TOTAL) BY MOUTH DAILY. 90 tablet 1  . traMADol (ULTRAM) 50 MG tablet Take 1 tablet (50 mg total) by mouth every 8 (eight) hours as needed. 50 tablet 2  . triamcinolone (KENALOG) 0.1 % paste Use as directed 1 application in the mouth or throat 2 (two) times daily. For one week. 5 g 1  . triamcinolone cream (KENALOG) 0.1 % Apply 1 application topically 2 (two) times daily. 453.611 g 0  . ciprofloxacin (CIPRO) 500 MG tablet Take 1 tablet (500 mg total) by mouth 2 (two) times daily. 28 tablet 0   No current facility-administered medications for this visit.   Allergies  Allergen Reactions  . Aspirin Rash    Patient indicates increases Urticaria.   . Azithromycin Nausea And Vomiting  . Morphine And Related Rash    Urticaria   . Clindamycin/Lincomycin     Facial swelling  .  Cortisporin [Bacitra-Neomycin-Polymyxin-Hc]   . Erythromycin Nausea And Vomiting    Has taken azithromycin with no issues in the past.   . Metronidazole     Rash  . Nsaids Swelling     Exam:  BP 125/99 mmHg  Pulse 88  Wt 192 lb (87.091 kg) Gen: Well NAD HEENT: EOMI,  MMM poor dentition throughout Lungs: Normal work of breathing. CTABL Heart: RRR no MRG Abd: NABS, Soft. Nondistended, Nontender Exts: Brisk capillary refill, warm and well perfused.  Neck: Minimally tender to midline C7 area. Mildly tender to palpation bilateral cervical paraspinals. Normal neck range of motion of her surgery strength reflexes and sensation is equal and normal  throughout.  No results found for this or any previous visit (from the past 24 hour(s)). No results found.   Please see individual assessment and plan sections.

## 2015-09-01 NOTE — Progress Notes (Signed)
Quick Note:  Xray is pretty normal ______

## 2015-09-10 ENCOUNTER — Other Ambulatory Visit: Payer: Self-pay | Admitting: Family Medicine

## 2015-09-17 ENCOUNTER — Other Ambulatory Visit: Payer: Self-pay | Admitting: Family Medicine

## 2015-09-18 ENCOUNTER — Encounter: Payer: Self-pay | Admitting: Family Medicine

## 2015-09-18 ENCOUNTER — Ambulatory Visit (INDEPENDENT_AMBULATORY_CARE_PROVIDER_SITE_OTHER): Payer: Medicare Other | Admitting: Family Medicine

## 2015-09-18 VITALS — BP 131/54 | HR 100 | Temp 98.0°F | Wt 193.0 lb

## 2015-09-18 DIAGNOSIS — K047 Periapical abscess without sinus: Secondary | ICD-10-CM

## 2015-09-18 MED ORDER — CIPROFLOXACIN HCL 500 MG PO TABS: 500.0000 mg | ORAL_TABLET | Freq: Two times a day (BID) | ORAL | Status: DC

## 2015-09-18 NOTE — Progress Notes (Signed)
Janet Richards is a 54 y.o. female who presents to [facility]  today for tooth pain. Patient returns today with worsening right upper tooth pain. This is similar to previous complaints. Previously she's done well with Cipro antibiotic medications. She's had multiple attempts to get her tooth pulled but has canceled or been asked to reschedule several times. The current barrier to her getting her tooth pulled is financial shortfall. She denies any current fevers or chills. Pain is been present for a few days.   Past Medical History  Diagnosis Date  . Urticaria   . Essential hypertension, benign   . Primary osteoarthritis of both knees   . Menstrual migraine    Past Surgical History  Procedure Laterality Date  . Tubal ligation  12/26/1987   Social History  Substance Use Topics  . Smoking status: Former Smoker -- 1.50 packs/day    Types: Cigarettes    Quit date: 02/05/2006  . Smokeless tobacco: Never Used  . Alcohol Use: Yes     Comment: rarely   family history includes CAD in her father; Hypertension in her mother.  ROS as above Medications: Current Outpatient Prescriptions  Medication Sig Dispense Refill  . albuterol (PROVENTIL HFA;VENTOLIN HFA) 108 (90 BASE) MCG/ACT inhaler Inhale two puffs every 4-6 hours only as needed for shortness of breath or wheezing. 1 Inhaler 1  . azelastine (ASTELIN) 0.1 % nasal spray Place 1 spray into both nostrils 2 (two) times daily. Use in each nostril as directed 30 mL 12  . cetirizine (ZYRTEC) 1 MG/ML syrup TAKE 10 MLS (10 MG TOTAL) BY MOUTH DAILY. 900 mL 0  . ciprofloxacin (CIPRO) 500 MG tablet Take 1 tablet (500 mg total) by mouth 2 (two) times daily. 28 tablet 0  . cyclobenzaprine (FLEXERIL) 10 MG tablet Take a half to a full tab every 8-12 hours only as needed for muscle spasm, may cause sedation. 45 tablet 0  . gabapentin (NEURONTIN) 100 MG capsule Take 1 capsule (100 mg total) by mouth 3 (three) times  daily. 90 capsule 0  . hydrochlorothiazide (HYDRODIURIL) 25 MG tablet TAKE 1 TABLET (25 MG TOTAL) BY MOUTH DAILY. 90 tablet 2  . hydrOXYzine (ATARAX/VISTARIL) 10 MG tablet Take 10 mg by mouth 3 (three) times daily as needed for itching.     [name] ipratropium (ATROVENT) 0.06 % nasal spray Place 2 sprays into both nostrils 4 (four) times daily. 15 mL 1  . montelukast (SINGULAIR) 10 MG tablet Take 10 mg by mouth at bedtime.     . Multiple Vitamins-Minerals (MULTIVITAMIN PO) Take by mouth.    . pantoprazole (PROTONIX) 40 MG tablet TAKE 1 TABLET (40 MG TOTAL) BY MOUTH DAILY. 90 tablet 1  . traMADol (ULTRAM) 50 MG tablet Take 1 tablet (50 mg total) by mouth every 8 (eight) hours as needed. 50 tablet 2  . triamcinolone (KENALOG) 0.1 % paste Use as directed 1 application in the mouth or throat 2 (two) times daily. For one week. 5 g 1  . triamcinolone cream (KENALOG) 0.1 % Apply 1 application topically 2 (two) times daily. 453.611 g 0   No current facility-administered medications for this visit.   Allergies  Allergen Reactions  . Aspirin Rash    Patient indicates increases Urticaria.   . Azithromycin Nausea And Vomiting  . Morphine And Related Rash    Urticaria   . Clindamycin/Lincomycin     Facial swelling  . Cortisporin [Bacitra-Neomycin-Polymyxin-Hc]   . Erythromycin Nausea And Vomiting  Has taken azithromycin with no issues in the past.   . Metronidazole     Rash  . Nsaids Swelling     Exam:  BP 131/54 mmHg  Pulse 100  Temp(Src) 98 F (36.7 C) (Oral)  Wt 193 lb (87.544 kg)  SpO2 100% Gen: Well NAD HEENT: EOMI,  MMM poor dentition throughout with multiple dental caries and partial teeth. Psych: Alert and oriented normal affect.  No results found for this or any previous visit (from the past 24 hour(s)). No results found.   Please see individual assessment and plan sections.

## 2015-09-18 NOTE — Assessment & Plan Note (Addendum)
Recurrent dental infection. The patient and I had a lengthy discussion about the safety of continued oral antibiotics for dental infection. I explained to the patient this is the last time I will give antibiotics for tooth pain. Ultimately she's had multiple opportunities to address her tooth definitively and for a variety of reasons has not been able to. I'm quite sympathetic to her plight but it's not safe to continue antibiotics for this.  She became quite upset but ultimately express understanding and agreement.   Exacerbation of chronic problem.

## 2015-09-18 NOTE — Patient Instructions (Signed)
Thank you for coming in today. Get your teeth pulled ASAP.  I will not continue to provide antibiotics for dental infections after this visit.   [place]:  [place] [id number] (ext [phone])  [place]  Please call Dr. Donn Pierini office [phone] or cell [phone] [address] [place] for tooth removal $200 includes exam, Xray, and extraction and follow up visit.  Bring list of current medications with you.   [facility]  [address]  [place], [place], [place], [address]  [phone], Ext. [phone]  2nd and 4th Thursday of the month at 6:30am (Simple extractions only - no wisdom teeth or surgery) First come/First serve -First 10 clients served  [facility] [place], [place] and [facility] residents only)  [address] [place], [place], [address]  [phone]  [place]  [phone]  [facility]  [phone]  Please call [facility] at [phone] to get the details to get your tooth pulled.     Dental Abscess A dental abscess is pus in or around a tooth. HOME CARE  Take medicines only as told by your dentist.  If you were prescribed antibiotic medicine, finish all of it even if you start to feel better.  Rinse your mouth (gargle) often with salt water.  Do not drive or use heavy machinery, like a lawn mower, while taking pain medicine.  Do not apply heat to the outside of your mouth.  Keep all follow-up visits as told by your dentist. This is important. GET HELP IF:  Your pain is worse, and medicine does not help. GET HELP RIGHT AWAY IF:  You have a fever or chills.  Your symptoms suddenly get worse.  You have a very bad headache.  You have problems breathing  or swallowing.  You have trouble opening your mouth.  You have puffiness (swelling) in your neck or around your eye.   This information is not intended to replace advice given to you by your health care provider. Make sure you discuss any questions you have with your health care provider.   Document Released: 03/10/2015 Document Reviewed: 03/10/2015 Elsevier Interactive Patient Education [employer].

## 2015-10-28 ENCOUNTER — Other Ambulatory Visit: Payer: Self-pay | Admitting: Family Medicine

## 2015-11-10 ENCOUNTER — Encounter: Payer: Self-pay | Admitting: *Deleted

## 2015-11-10 ENCOUNTER — Emergency Department (INDEPENDENT_AMBULATORY_CARE_PROVIDER_SITE_OTHER)
Admission: EM | Admit: 2015-11-10 | Discharge: 2015-11-10 | Disposition: A | Discharge status: Home / Self Care | Payer: Medicare Other | Source: Home / Self Care | Attending: Family Medicine | Admitting: Family Medicine

## 2015-11-10 DIAGNOSIS — J209 Acute bronchitis, unspecified: Secondary | ICD-10-CM

## 2015-11-10 MED ORDER — BENZONATATE 200 MG PO CAPS: 200.0000 mg | ORAL_CAPSULE | Freq: Every day | ORAL | Status: DC

## 2015-11-10 MED ORDER — AMOXICILLIN 875 MG PO TABS: 875.0000 mg | ORAL_TABLET | Freq: Two times a day (BID) | ORAL | Status: DC

## 2015-11-10 MED ORDER — FLUCONAZOLE 150 MG PO TABS: ORAL_TABLET | ORAL | Status: DC

## 2015-11-10 NOTE — ED Notes (Signed)
Pt c/o bilateral ear pain, sore throat, and nasal congestion x 2 wks.

## 2015-11-10 NOTE — Discharge Instructions (Signed)
Take plain guaifenesin (1200mg extended release tabs such as Mucinex) twice daily, with plenty of water, for cough and congestion.  May add Pseudoephedrine (30mg, one or two every 4 to 6 hours) for sinus congestion.  Get adequate rest.   °May use Afrin nasal spray (or generic oxymetazoline) twice daily for about 5 days and then discontinue.  Also recommend using saline nasal spray several times daily and saline nasal irrigation (AYR is a common brand).  Use Flonase nasal spray each morning after using Afrin nasal spray and saline nasal irrigation. °Try warm salt water gargles for sore throat.  °Stop all antihistamines for now, and other non-prescription cough/cold preparations. °  °Follow-up with family doctor if not improving about one week °

## 2015-11-10 NOTE — ED Provider Notes (Signed)
CSN: [id number]     Arrival date & time 11/10/15  32 History   First MD Initiated Contact with Patient 11/10/15 1747     Chief Complaint  Patient presents with  . Otalgia  . Nasal Congestion      HPI Comments: Two weeks ago patient developed typical cold-like symptoms including mild sore throat, sinus congestion, fatigue, chills, and cough.  Her symptoms have persisted and during the past two days she has developed bilateral earache.  The history is provided by the patient.    Past Medical History  Diagnosis Date  . Urticaria   . Essential hypertension, benign   . Primary osteoarthritis of both knees   . Menstrual migraine    Past Surgical History  Procedure Laterality Date  . Tubal ligation  12/26/1987   Family History  Problem Relation Age of Onset  . Hypertension Mother   . CAD Father     MI in his 58s   Social History  Substance Use Topics  . Smoking status: Former Smoker -- 1.50 packs/day    Types: Cigarettes    Quit date: 02/05/2006  . Smokeless tobacco: Never Used  . Alcohol Use: Yes     Comment: rarely   OB History    Gravida Para Term Preterm AB TAB SAB Ectopic Multiple Living   5 3 3  2 2    3      Review of Systems + sore throat + cough No pleuritic pain ? wheezing + nasal congestion ? post-nasal drainage + sinus pain/pressure No itchy/red eyes + earache No hemoptysis No SOB No fever, + chills No nausea No vomiting No abdominal pain No diarrhea No urinary symptoms No skin rash + fatigue No myalgias No headache Used OTC meds without relief  Allergies  Aspirin; Azithromycin; Morphine and related; Clindamycin/lincomycin; Cortisporin; Erythromycin; Metronidazole; and Nsaids  Home Medications   Prior to Admission medications   Medication Sig Start Date End Date Taking? Authorizing Provider  albuterol (PROVENTIL HFA;VENTOLIN HFA) 108 (90 BASE) MCG/ACT inhaler Inhale two puffs every 4-6 hours only as needed for shortness of breath or  wheezing. 05/26/15 05/25/16  Sean Hommel, DO  amoxicillin (AMOXIL) 875 MG tablet Take 1 tablet (875 mg total) by mouth 2 (two) times daily. 11/10/15   Kandra Nicolas, MD  azelastine (ASTELIN) 0.1 % nasal spray Place 1 spray into both nostrils 2 (two) times daily. Use in each nostril as directed 06/03/14   Marcial Pacas, DO  benzonatate (TESSALON) 200 MG capsule Take 1 capsule (200 mg total) by mouth at bedtime. Take as needed for cough 11/10/15   Kandra Nicolas, MD  cetirizine (ZYRTEC) 1 MG/ML syrup TAKE 10 MLS (10 MG TOTAL) BY MOUTH DAILY. 09/10/15   Marcial Pacas, DO  cyclobenzaprine (FLEXERIL) 10 MG tablet Take a half to a full tab every 8-12 hours only as needed for muscle spasm, may cause sedation. 08/08/13   Sean Hommel, DO  fluconazole (DIFLUCAN) 150 MG tablet For a yeast infection, take 1 tablet by mouth once.  May repeat in 72 hours. 11/10/15   Kandra Nicolas, MD  gabapentin (NEURONTIN) 100 MG capsule Take 1 capsule (100 mg total) by mouth 3 (three) times daily. 07/01/15   [name], MD  hydrochlorothiazide (HYDRODIURIL) 25 MG tablet TAKE 1 TABLET (25 MG TOTAL) BY MOUTH DAILY. 04/29/15   Marcial Pacas, DO  hydrOXYzine (ATARAX/VISTARIL) 10 MG tablet Take 10 mg by mouth 3 (three) times daily as needed for itching.  Historical Provider, MD  ipratropium (ATROVENT) 0.06 % nasal spray Place 2 sprays into both nostrils 4 (four) times daily. 06/09/15   [name], MD  montelukast (SINGULAIR) 10 MG tablet Take 10 mg by mouth at bedtime.  04/04/13   Historical Provider, MD  Multiple Vitamins-Minerals (MULTIVITAMIN PO) Take by mouth.    Historical Provider, MD  pantoprazole (PROTONIX) 40 MG tablet TAKE 1 TABLET (40 MG TOTAL) BY MOUTH DAILY. 09/01/15   [name], MD  traMADol (ULTRAM) 50 MG tablet Take 1 tablet (50 mg total) by mouth every 8 (eight) hours as needed. 02/18/15   Sean Hommel, DO  triamcinolone (KENALOG) 0.1 % paste Use as directed 1 application in the mouth or throat 2 (two) times daily. For one  week. 06/17/14   Marcial Pacas, DO  triamcinolone cream (KENALOG) 0.1 % Apply 1 application topically 2 (two) times daily. 06/09/15   [name], MD   Meds Ordered and Administered this Visit  Medications - No data to display  BP 131/85 mmHg  Pulse 90  Temp(Src) 98.6 F (37 C) (Oral)  Resp 16  Ht 5\' 3"  (1.6 m)  Wt 191 lb (86.637 kg)  BMI 33.84 kg/m2  SpO2 98%  LMP 10/08/2015 No data found.   Physical Exam Nursing notes and Vital Signs reviewed. Appearance:  Patient appears stated age, and in no acute distress.  Patient is obese (BMI 33.8) Eyes:  Pupils are equal, round, and reactive to light and accomodation.  Extraocular movement is intact.  Conjunctivae are not inflamed  Ears:  Canals normal.  Tympanic membranes normal.  Nose:  Mildly congested turbinates.  Maxillary sinus tenderness is present.  Pharynx:  Normal Neck:  Supple.  Tender enlarged posterior nodes are palpated bilaterally  Lungs:  Clear to auscultation.  Breath sounds are equal.  Moving air well. Chest:  Distinct tenderness to palpation over the mid-sternum. Heart:  Regular rate and rhythm without murmurs, rubs, or gallops.  Abdomen:  Nontender without masses or hepatosplenomegaly.  Bowel sounds are present.  No CVA or flank tenderness.  Extremities:  No edema.  Skin:  No rash present.   ED Course  Procedures none    MDM   1. Acute bronchitis, unspecified organism    Begin amoxicillin..  Prescription written for Benzonatate [facility]) to take at bedtime for night-time cough. Rx for Diflucan if yeast infection develops.    Take plain guaifenesin (1200mg  extended release tabs such as Mucinex) twice daily, with plenty of water, for cough and congestion.  May add Pseudoephedrine (30mg , one or two every 4 to 6 hours) for sinus congestion.  Get adequate rest.   May use Afrin nasal spray (or generic oxymetazoline) twice daily for about 5 days and then discontinue.  Also recommend using saline nasal spray several  times daily and saline nasal irrigation (AYR is a common brand).  Use Flonase nasal spray each morning after using Afrin nasal spray and saline nasal irrigation. Try warm salt water gargles for sore throat.  Stop all antihistamines for now, and other non-prescription cough/cold preparations. Follow-up with family doctor if not improving about one week    Kandra Nicolas, MD 11/14/15 2150

## 2015-11-15 ENCOUNTER — Telehealth: Payer: Self-pay | Admitting: Emergency Medicine

## 2015-11-23 ENCOUNTER — Other Ambulatory Visit: Payer: Self-pay | Admitting: Family Medicine

## 2015-11-25 ENCOUNTER — Ambulatory Visit (INDEPENDENT_AMBULATORY_CARE_PROVIDER_SITE_OTHER): Payer: Medicare Other | Admitting: Family Medicine

## 2015-11-25 ENCOUNTER — Encounter: Payer: Self-pay | Admitting: Family Medicine

## 2015-11-25 ENCOUNTER — Ambulatory Visit: Payer: Self-pay | Admitting: Family Medicine

## 2015-11-25 VITALS — BP 154/80 | HR 78 | Temp 98.7°F | Wt 193.0 lb

## 2015-11-25 DIAGNOSIS — N390 Urinary tract infection, site not specified: Secondary | ICD-10-CM | POA: Insufficient documentation

## 2015-11-25 DIAGNOSIS — N3000 Acute cystitis without hematuria: Secondary | ICD-10-CM | POA: Diagnosis not present

## 2015-11-25 DIAGNOSIS — I10 Essential (primary) hypertension: Secondary | ICD-10-CM

## 2015-11-25 DIAGNOSIS — N3001 Acute cystitis with hematuria: Secondary | ICD-10-CM | POA: Diagnosis not present

## 2015-11-25 DIAGNOSIS — R300 Dysuria: Secondary | ICD-10-CM | POA: Diagnosis not present

## 2015-11-25 LAB — POCT URINALYSIS DIPSTICK
Bilirubin, UA: NEGATIVE
Glucose, UA: NEGATIVE
Ketones, UA: NEGATIVE
Leukocytes, UA: NEGATIVE
Nitrite, UA: NEGATIVE
Protein, UA: NEGATIVE
Spec Grav, UA: <=1.005
Urobilinogen, UA: 0.2
pH, UA: 6.5

## 2015-11-25 MED ORDER — CIPROFLOXACIN HCL 500 MG PO TABS: 500.0000 mg | ORAL_TABLET | Freq: Two times a day (BID) | ORAL | Status: DC

## 2015-11-25 NOTE — Assessment & Plan Note (Signed)
Return for a dedicated visit for hypertension

## 2015-11-25 NOTE — Assessment & Plan Note (Signed)
Urine culture and wet prep pending. Empiric treatment with Cipro

## 2015-11-25 NOTE — Patient Instructions (Signed)
Thank you for coming in today. We will call with results ina few days.  Take cipro twice daily for UTI symptoms.  Return to restart primary care issues.   Urinary Tract Infection Urinary tract infections (UTIs) can develop anywhere along your urinary tract. Your urinary tract is your body's drainage system for removing wastes and extra water. Your urinary tract includes two kidneys, two ureters, a bladder, and a urethra. Your kidneys are a pair of bean-shaped organs. Each kidney is about the size of your fist. They are located below your ribs, one on each side of your spine. CAUSES Infections are caused by microbes, which are microscopic organisms, including fungi, viruses, and bacteria. These organisms are so small that they can only be seen through a microscope. Bacteria are the microbes that most commonly cause UTIs. SYMPTOMS  Symptoms of UTIs may vary by age and gender of the patient and by the location of the infection. Symptoms in young women typically include a frequent and intense urge to urinate and a painful, burning feeling in the bladder or urethra during urination. Older women and men are more likely to be tired, shaky, and weak and have muscle aches and abdominal pain. A fever may mean the infection is in your kidneys. Other symptoms of a kidney infection include pain in your back or sides below the ribs, nausea, and vomiting. DIAGNOSIS To diagnose a UTI, your caregiver will ask you about your symptoms. Your caregiver will also ask you to provide a urine sample. The urine sample will be tested for bacteria and white blood cells. White blood cells are made by your body to help fight infection. TREATMENT  Typically, UTIs can be treated with medication. Because most UTIs are caused by a bacterial infection, they usually can be treated with the use of antibiotics. The choice of antibiotic and length of treatment depend on your symptoms and the type of bacteria causing your infection. HOME  CARE INSTRUCTIONS  If you were prescribed antibiotics, take them exactly as your caregiver instructs you. Finish the medication even if you feel better after you have only taken some of the medication.  Drink enough water and fluids to keep your urine clear or pale yellow.  Avoid caffeine, tea, and carbonated beverages. They tend to irritate your bladder.  Empty your bladder often. Avoid holding urine for long periods of time.  Empty your bladder before and after sexual intercourse.  After a bowel movement, women should cleanse from front to back. Use each tissue only once. SEEK MEDICAL CARE IF:   You have back pain.  You develop a fever.  Your symptoms do not begin to resolve within 3 days. SEEK IMMEDIATE MEDICAL CARE IF:   You have severe back pain or lower abdominal pain.  You develop chills.  You have nausea or vomiting.  You have continued burning or discomfort with urination. MAKE SURE YOU:   Understand these instructions.  Will watch your condition.  Will get help right away if you are not doing well or get worse.   This information is not intended to replace advice given to you by your health care provider. Make sure you discuss any questions you have with your health care provider.   Document Released: 08/03/2005 Document Revised: 07/15/2015 Document Reviewed: 12/02/2011 Elsevier Interactive Patient Education [employer].

## 2015-11-25 NOTE — Progress Notes (Signed)
Janet Richards is a 55 y.o. female who presents to [facility] today for abdominal pressure and urinary frequency urgency and dysuria. Symptoms present for a few weeks. Patient denies any blood in the urine. No fevers chills nausea vomiting or diarrhea. She does note mild vaginal discharge but no vaginal pain or irritation. She has not tried any symptoms yet. She's concerned she may have a urinary tract infection.   Past Medical History  Diagnosis Date  . Urticaria   . Essential hypertension, benign   . Primary osteoarthritis of both knees   . Menstrual migraine    Past Surgical History  Procedure Laterality Date  . Tubal ligation  12/26/1987   Social History  Substance Use Topics  . Smoking status: Former Smoker -- 1.50 packs/day    Types: Cigarettes    Quit date: 02/05/2006  . Smokeless tobacco: Never Used  . Alcohol Use: Yes     Comment: rarely   family history includes CAD in her father; Hypertension in her mother.  ROS as above Medications: Current Outpatient Prescriptions  Medication Sig Dispense Refill  . albuterol (PROVENTIL HFA;VENTOLIN HFA) 108 (90 BASE) MCG/ACT inhaler Inhale two puffs every 4-6 hours only as needed for shortness of breath or wheezing. 1 Inhaler 1  . azelastine (ASTELIN) 0.1 % nasal spray Place 1 spray into both nostrils 2 (two) times daily. Use in each nostril as directed 30 mL 12  . cetirizine (ZYRTEC) 1 MG/ML syrup TAKE 10 MLS (10 MG TOTAL) BY MOUTH DAILY. 900 mL 0  . cyclobenzaprine (FLEXERIL) 10 MG tablet Take a half to a full tab every 8-12 hours only as needed for muscle spasm, may cause sedation. 45 tablet 0  . gabapentin (NEURONTIN) 100 MG capsule Take 1 capsule (100 mg total) by mouth 3 (three) times daily. 90 capsule 0  . hydrochlorothiazide (HYDRODIURIL) 25 MG tablet TAKE 1 TABLET (25 MG TOTAL) BY MOUTH DAILY. 90 tablet 2  .  hydrOXYzine (ATARAX/VISTARIL) 10 MG tablet Take 10 mg by mouth 3 (three) times daily as needed for itching.     [name] ipratropium (ATROVENT) 0.06 % nasal spray Place 2 sprays into both nostrils 4 (four) times daily. 15 mL 1  . montelukast (SINGULAIR) 10 MG tablet Take 10 mg by mouth at bedtime.     . pantoprazole (PROTONIX) 40 MG tablet TAKE 1 TABLET (40 MG TOTAL) BY MOUTH DAILY. 90 tablet 1  . triamcinolone (KENALOG) 0.1 % paste Use as directed 1 application in the mouth or throat 2 (two) times daily. For one week. 5 g 1  . triamcinolone cream (KENALOG) 0.1 % Apply 1 application topically 2 (two) times daily. 453.611 g 0  . ciprofloxacin (CIPRO) 500 MG tablet Take 1 tablet (500 mg total) by mouth 2 (two) times daily. 14 tablet 0   No current facility-administered medications for this visit.   Allergies  Allergen Reactions  . Aspirin Rash    Patient indicates increases Urticaria.   . Azithromycin Nausea And Vomiting  . Morphine And Related Rash    Urticaria   . Clindamycin/Lincomycin     Facial swelling  . Cortisporin [Bacitra-Neomycin-Polymyxin-Hc]   . Erythromycin Nausea And Vomiting    Has taken azithromycin with no issues in the past.   . Metronidazole     Rash  . Nsaids Swelling     Exam:  BP 154/80 mmHg  Pulse 78  Temp(Src) 98.7 F (37.1 C) (Oral)  Wt  193 lb (87.544 kg)  LMP 10/08/2015 Gen: Well NAD HEENT: EOMI,  MMM Lungs: Normal work of breathing. CTABL Heart: RRR no MRG Abd: NABS, Soft. Nondistended, Nontender no rebound or guarding. No CVA no tenderness to percussion. Exts: Brisk capillary refill, warm and well perfused.  GYN: exam deferred per pt.   Results for orders placed or performed in visit on 11/25/15 (from the past 24 hour(s))  POCT Urinalysis Dipstick     Status: None   Collection Time: 11/25/15 11:57 AM  Result Value Ref Range   Color, UA no color/clear    Clarity, UA clear    Glucose, UA neg    Bilirubin, UA neg    Ketones, UA neg    Spec Grav,  UA <=1.005    Blood, UA trace-lysed    pH, UA 6.5    Protein, UA neg    Urobilinogen, UA 0.2    Nitrite, UA neg    Leukocytes, UA Negative Negative   No results found.   Please see individual assessment and plan sections.

## 2015-11-26 LAB — WET PREP FOR TRICH, YEAST, CLUE
Trich, Wet Prep: NONE SEEN
Yeast Wet Prep HPF POC: NONE SEEN

## 2015-11-27 LAB — URINE CULTURE: Colony Count: 25000

## 2015-11-27 NOTE — Progress Notes (Signed)
Quick Note:  Labs show BV. Janet Richards is allergic to the two antibiotics used to treat it. Can she use vaginal metronidazole or vaginal clindamycin? ______

## 2015-11-29 ENCOUNTER — Other Ambulatory Visit: Payer: Self-pay | Admitting: Family Medicine

## 2015-12-02 MED ORDER — METRONIDAZOLE 0.75 % VA GEL: 1.0000 | Freq: Two times a day (BID) | VAGINAL | Status: DC

## 2015-12-02 NOTE — Addendum Note (Signed)
Addended by: [name] on: 12/02/2015 12:45 PM   Modules accepted: Orders, SmartSet

## 2015-12-07 ENCOUNTER — Telehealth: Payer: Self-pay

## 2015-12-07 NOTE — Telephone Encounter (Signed)
Pt called requesting a rx for prednisone due to urticaria. Pt has an appointment on 12/09/2015 Please advise.

## 2015-12-08 MED ORDER — PREDNISONE 10 MG PO TABS
30.0000 mg | ORAL_TABLET | Freq: Every day | ORAL | Status: DC
Start: 2015-12-08 — End: 2015-12-22

## 2015-12-08 NOTE — Telephone Encounter (Signed)
Prednisone ordered

## 2015-12-09 ENCOUNTER — Encounter: Payer: Self-pay | Admitting: Family Medicine

## 2015-12-09 ENCOUNTER — Ambulatory Visit (INDEPENDENT_AMBULATORY_CARE_PROVIDER_SITE_OTHER): Payer: Medicare Other | Admitting: Family Medicine

## 2015-12-09 ENCOUNTER — Ambulatory Visit: Payer: Self-pay | Admitting: Family Medicine

## 2015-12-09 VITALS — BP 125/79 | HR 86 | Wt 192.0 lb

## 2015-12-09 DIAGNOSIS — I10 Essential (primary) hypertension: Secondary | ICD-10-CM

## 2015-12-09 DIAGNOSIS — R202 Paresthesia of skin: Secondary | ICD-10-CM | POA: Diagnosis not present

## 2015-12-09 DIAGNOSIS — L505 Cholinergic urticaria: Secondary | ICD-10-CM

## 2015-12-09 DIAGNOSIS — M5416 Radiculopathy, lumbar region: Secondary | ICD-10-CM

## 2015-12-09 DIAGNOSIS — R739 Hyperglycemia, unspecified: Secondary | ICD-10-CM

## 2015-12-09 DIAGNOSIS — E785 Hyperlipidemia, unspecified: Secondary | ICD-10-CM | POA: Diagnosis not present

## 2015-12-09 DIAGNOSIS — M5417 Radiculopathy, lumbosacral region: Secondary | ICD-10-CM

## 2015-12-09 DIAGNOSIS — R7301 Impaired fasting glucose: Secondary | ICD-10-CM

## 2015-12-09 MED ORDER — METRONIDAZOLE 0.75 % VA GEL: 1.0000 | Freq: Two times a day (BID) | VAGINAL | Status: DC

## 2015-12-09 NOTE — Assessment & Plan Note (Signed)
Resolved with PREDNISONE. Continue to follow.

## 2015-12-09 NOTE — Assessment & Plan Note (Signed)
Nerve Conduction studies ordered

## 2015-12-09 NOTE — Assessment & Plan Note (Signed)
Check routine fasting labs.

## 2015-12-09 NOTE — Progress Notes (Signed)
Janet Richards is a 55 y.o. female who presents to [facility] today for    1) urticaria: Patient has a history of urticaria with frequent exacerbations. For the last 3 weeks her urticaria has slowly been worsening. She has tried topical triamcinolone cream which typically helps. She called and received a five-day course of prednisone 2 days ago which has made dramatic improvements. She feels much better now.   2) BV: Patient was recently diagnosed with bacterial vaginosis. She was treated with vaginal metronidazole gel which has completely worked. She feels much better.  3) paresthesias: Patient notes tingling burning pain in her feet bilaterally right worse than left. She denies any radicular pain weakness or numbness. She notes she has a history of right lumbar radiculopathy. Symptoms have been present for months and are stable. In the past she has been prescribed gabapentin which she did not take much of her did it help much.  4) hypertension: Well controlled with hydrochlorothiazide. No chest pains palpitations or shortness of breath.    Past Medical History  Diagnosis Date  . Urticaria   . Essential hypertension, benign   . Primary osteoarthritis of both knees   . Menstrual migraine    Past Surgical History  Procedure Laterality Date  . Tubal ligation  12/26/1987   Social History  Substance Use Topics  . Smoking status: Former Smoker -- 1.50 packs/day    Types: Cigarettes    Quit date: 02/05/2006  . Smokeless tobacco: Never Used  . Alcohol Use: Yes     Comment: rarely   family history includes CAD in her father; Hypertension in her mother.  ROS as above Medications: Current Outpatient Prescriptions  Medication Sig Dispense Refill  . albuterol (PROVENTIL HFA;VENTOLIN HFA) 108 (90 BASE) MCG/ACT inhaler Inhale two puffs every 4-6 hours only as needed for shortness of  breath or wheezing. 1 Inhaler 1  . cetirizine (ZYRTEC) 1 MG/ML syrup TAKE 10 MLS (10 MG TOTAL) BY MOUTH DAILY. 900 mL 0  . cyclobenzaprine (FLEXERIL) 10 MG tablet Take a half to a full tab every 8-12 hours only as needed for muscle spasm, may cause sedation. 45 tablet 0  . hydrochlorothiazide (HYDRODIURIL) 25 MG tablet TAKE 1 TABLET (25 MG TOTAL) BY MOUTH DAILY. 90 tablet 2  . hydrOXYzine (ATARAX/VISTARIL) 10 MG tablet Take 10 mg by mouth 3 (three) times daily as needed for itching.     [name] ipratropium (ATROVENT) 0.06 % nasal spray Place 2 sprays into both nostrils 4 (four) times daily. 15 mL 1  . metroNIDAZOLE (METROGEL) 0.75 % vaginal gel Place 1 Applicatorful vaginally 2 (two) times daily. 70 g 6  . montelukast (SINGULAIR) 10 MG tablet Take 10 mg by mouth at bedtime.     . pantoprazole (PROTONIX) 40 MG tablet TAKE 1 TABLET (40 MG TOTAL) BY MOUTH DAILY. 90 tablet 1  . predniSONE (DELTASONE) 10 MG tablet Take 3 tablets (30 mg total) by mouth daily. 15 tablet 0  . triamcinolone (KENALOG) 0.1 % paste Use as directed 1 application in the mouth or throat 2 (two) times daily. For one week. 5 g 1  . triamcinolone cream (KENALOG) 0.1 % Apply 1 application topically 2 (two) times daily. 453.611 g 0   No current facility-administered medications for this visit.   Allergies  Allergen Reactions  . Aspirin Rash    Patient indicates increases Urticaria.   . Azithromycin Nausea And Vomiting  . Morphine And Related  Rash    Urticaria   . Clindamycin/Lincomycin     Facial swelling  . Cortisporin [Bacitra-Neomycin-Polymyxin-Hc]   . Erythromycin Nausea And Vomiting    Has taken azithromycin with no issues in the past.   . Metronidazole     Rash  . Nsaids Swelling     Exam:  BP 125/79 mmHg  Pulse 86  Wt 192 lb (87.091 kg)  LMP 10/08/2015 Gen: Well NAD obese HEENT: EOMI,  MMM Lungs: Normal work of breathing. CTABL Heart: RRR no MRG Abd: NABS, Soft. Nondistended, Nontender Exts: Brisk capillary  refill, warm and well perfused.  Skin: No rash. Normal-appearing Neuro: Alert and oriented. Normal coordination balance and gait. Sensation is intact distally. Reflexes are equal and normal bilaterally. Pulses capillary Refill and sensation are intact distally.  No results found for this or any previous visit (from the past 24 hour(s)). No results found.   Please see individual assessment and plan sections.

## 2015-12-09 NOTE — Assessment & Plan Note (Signed)
Check A1c. 

## 2015-12-09 NOTE — Assessment & Plan Note (Signed)
Check fasting labs 

## 2015-12-09 NOTE — Patient Instructions (Signed)
Thank you for coming in today. Get fasting labs.  Continue vaginal flagyl as needed.  Get nerve conduction test to work up the foot tingling and pain.  Return in a month or so.   Peripheral Neuropathy Peripheral neuropathy is a type of nerve damage. It affects nerves that carry signals between the spinal cord and other parts of the body. These are called peripheral nerves. With peripheral neuropathy, one nerve or a group of nerves may be damaged.  CAUSES  Many things can damage peripheral nerves. For some people with peripheral neuropathy, the cause is unknown. Some causes include:  Diabetes. This is the most common cause of peripheral neuropathy.  Injury to a nerve.  Pressure or stress on a nerve that lasts a long time.  Too little vitamin B. Alcoholism can lead to this.  Infections.  Autoimmune diseases, such as multiple sclerosis and systemic lupus erythematosus.  Inherited nerve diseases.  Some medicines, such as cancer drugs.  Toxic substances, such as lead and mercury.  Too little blood flowing to the legs.  Kidney disease.  Thyroid disease. SIGNS AND SYMPTOMS  Different people have different symptoms. The symptoms you have will depend on which of your nerves is damaged. Common symptoms include:  Loss of feeling (numbness) in the feet and hands.  Tingling in the feet and hands.  Pain that burns.  Very sensitive skin.  Weakness.  Not being able to move a part of the body (paralysis).  Muscle twitching.  Clumsiness or poor coordination.  Loss of balance.  Not being able to control your bladder.  Feeling dizzy.  Sexual problems. DIAGNOSIS  Peripheral neuropathy is a symptom, not a disease. Finding the cause of peripheral neuropathy can be hard. To figure that out, your health care provider will take a medical history and do a physical exam. A neurological exam will also be done. This involves checking things affected by your brain, spinal cord, and  nerves (nervous system). For example, your health care provider will check your reflexes, how you move, and what you can feel.  Other types of tests may also be ordered, such as:  Blood tests.  A test of the fluid in your spinal cord.  Imaging tests, such as CT scans or an MRI.  Electromyography (EMG). This test checks the nerves that control muscles.  Nerve conduction velocity tests. These tests check how fast messages pass through your nerves.  Nerve biopsy. A small piece of nerve is removed. It is then checked under a microscope. TREATMENT   Medicine is often used to treat peripheral neuropathy. Medicines may include:  Pain-relieving medicines. Prescription or over-the-counter medicine may be suggested.  Antiseizure medicine. This may be used for pain.  Antidepressants. These also may help ease pain from neuropathy.  Lidocaine. This is a numbing medicine. You might wear a patch or be given a shot.  Mexiletine. This medicine is typically used to help control irregular heart rhythms.  Surgery. Surgery may be needed to relieve pressure on a nerve or to destroy a nerve that is causing pain.  Physical therapy to help movement.  Assistive devices to help movement. HOME CARE INSTRUCTIONS   Only take over-the-counter or prescription medicines as directed by your health care provider. Follow the instructions carefully for any given medicines. Do not take any other medicines without first getting approval from your health care provider.  If you have diabetes, work closely with your health care provider to keep your blood sugar under control.  If you  have numbness in your feet:  Check every day for signs of injury or infection. Watch for redness, warmth, and swelling.  Wear padded socks and comfortable shoes. These help protect your feet.  Do not do things that put pressure on your damaged nerve.  Do not smoke. Smoking keeps blood from getting to damaged nerves.  Avoid or  limit alcohol. Too much alcohol can cause a lack of B vitamins. These vitamins are needed for healthy nerves.  Develop a good support system. Coping with peripheral neuropathy can be stressful. Talk to a mental health specialist or join a support group if you are struggling.  Follow up with your health care provider as directed. SEEK MEDICAL CARE IF:   You have new signs or symptoms of peripheral neuropathy.  You are struggling emotionally from dealing with peripheral neuropathy.  You have a fever. SEEK IMMEDIATE MEDICAL CARE IF:   You have an injury or infection that is not healing.  You feel very dizzy or begin vomiting.  You have chest pain.  You have trouble breathing.   This information is not intended to replace advice given to you by your health care provider. Make sure you discuss any questions you have with your health care provider.   Document Released: 10/14/2002 Document Revised: 07/06/2011 Document Reviewed: 07/01/2013 Elsevier Interactive Patient Education [employer].

## 2015-12-10 LAB — COMPREHENSIVE METABOLIC PANEL
ALT: 15 U/L (ref 6–29)
AST: 12 U/L (ref 10–35)
Albumin: 4.1 g/dL (ref 3.6–5.1)
Alkaline Phosphatase: 73 U/L (ref 33–130)
BUN: 19 mg/dL (ref 7–25)
CO2: 28 mmol/L (ref 20–31)
Calcium: 9 mg/dL (ref 8.6–10.4)
Chloride: 100 mmol/L (ref 98–110)
Creat: 0.96 mg/dL (ref 0.50–1.05)
Glucose, Bld: 96 mg/dL (ref 65–99)
Potassium: 3.2 mmol/L — ABNORMAL LOW (ref 3.5–5.3)
Sodium: 139 mmol/L (ref 135–146)
Total Bilirubin: 0.4 mg/dL (ref 0.2–1.2)
Total Protein: 6.6 g/dL (ref 6.1–8.1)

## 2015-12-10 LAB — LIPID PANEL
Cholesterol: 210 mg/dL — ABNORMAL HIGH (ref 125–200)
HDL: 53 mg/dL (ref >=46)
LDL Cholesterol: 118 mg/dL (ref <130)
Total CHOL/HDL Ratio: 4 Ratio (ref <=5.0)
Triglycerides: 194 mg/dL — ABNORMAL HIGH (ref <150)
VLDL: 39 mg/dL — ABNORMAL HIGH (ref <30)

## 2015-12-10 LAB — CBC
HCT: 38.4 % (ref 36.0–46.0)
Hemoglobin: 12.5 g/dL (ref 12.0–15.0)
MCH: 28.3 pg (ref 26.0–34.0)
MCHC: 32.6 g/dL (ref 30.0–36.0)
MCV: 87.1 fL (ref 78.0–100.0)
MPV: 9.6 fL (ref 8.6–12.4)
Platelets: 510 10*3/uL — ABNORMAL HIGH (ref 150–400)
RBC: 4.41 MIL/uL (ref 3.87–5.11)
RDW: 15 % (ref 11.5–15.5)
WBC: 10.3 10*3/uL (ref 4.0–10.5)

## 2015-12-10 LAB — HEMOGLOBIN A1C
Hgb A1c MFr Bld: 6.3 % — ABNORMAL HIGH (ref <5.7)
Mean Plasma Glucose: 134 mg/dL — ABNORMAL HIGH (ref <117)

## 2015-12-11 LAB — VITAMIN D 25 HYDROXY (VIT D DEFICIENCY, FRACTURES): Vit D, 25-Hydroxy: 9 ng/mL — ABNORMAL LOW (ref 30–100)

## 2015-12-11 NOTE — Progress Notes (Signed)
Quick Note:  1) Vitamin D deficiency noted. Take 5000 units of vitamin D daily over-the-counter.  2) prediabetes has worsened. Work on weight loss.  ______

## 2015-12-17 ENCOUNTER — Other Ambulatory Visit: Payer: Self-pay | Admitting: Family Medicine

## 2015-12-22 ENCOUNTER — Ambulatory Visit (INDEPENDENT_AMBULATORY_CARE_PROVIDER_SITE_OTHER): Payer: Medicare Other | Admitting: Family Medicine

## 2015-12-22 ENCOUNTER — Encounter: Payer: Self-pay | Admitting: Family Medicine

## 2015-12-22 VITALS — BP 107/91 | HR 102 | Temp 99.3°F | Wt 193.0 lb

## 2015-12-22 DIAGNOSIS — J069 Acute upper respiratory infection, unspecified: Secondary | ICD-10-CM

## 2015-12-22 MED ORDER — CIPROFLOXACIN HCL 500 MG PO TABS: 500.0000 mg | ORAL_TABLET | Freq: Two times a day (BID) | ORAL | Status: DC

## 2015-12-22 MED ORDER — IPRATROPIUM BROMIDE 0.06 % NA SOLN: 2.0000 | Freq: Four times a day (QID) | NASAL | Status: DC

## 2015-12-22 NOTE — Assessment & Plan Note (Signed)
Likely viral URI given timeline and symptoms. Prescribed atrovent for rhinorrhea and antibiotics should this persist for 10 days. Follow up if worsening or not resolved at that time.

## 2015-12-22 NOTE — Patient Instructions (Signed)
Thank you for coming in today. Call or go to the emergency room if you get worse, have trouble breathing, have chest pains, or palpitations.  Use over the counter medicine and nasal spray.  If not better take cipro.

## 2015-12-22 NOTE — Progress Notes (Signed)
Janet Richards is a 55 y.o. female who presents to [facility] today for sinus pressure and sore throat.  Since Saturday night, patient has had sore throat with neck gland swelling, sinus pressure in the maxillary sinuses, nasal congestion, bilateral otalgia. She has a history of frequent sinus infections and she recently finished a prednisone course. She has tried saline spray but nothing else.   Patient denies fever, cough, nausea, vomiting, dyspnea, chest pain.    Past Medical History  Diagnosis Date  . Urticaria   . Essential hypertension, benign   . Primary osteoarthritis of both knees   . Menstrual migraine    Past Surgical History  Procedure Laterality Date  . Tubal ligation  12/26/1987   Social History  Substance Use Topics  . Smoking status: Former Smoker -- 1.50 packs/day    Types: Cigarettes    Quit date: 02/05/2006  . Smokeless tobacco: Never Used  . Alcohol Use: Yes     Comment: rarely   family history includes CAD in her father; Hypertension in her mother.  ROS as above Medications: Current Outpatient Prescriptions  Medication Sig Dispense Refill  . albuterol (PROVENTIL HFA;VENTOLIN HFA) 108 (90 BASE) MCG/ACT inhaler Inhale two puffs every 4-6 hours only as needed for shortness of breath or wheezing. 1 Inhaler 1  . cetirizine (ZYRTEC) 1 MG/ML syrup TAKE 10 MLS (10 MG TOTAL) BY MOUTH DAILY. 900 mL 0  . ciprofloxacin (CIPRO) 500 MG tablet Take 1 tablet (500 mg total) by mouth 2 (two) times daily. 14 tablet 0  . cyclobenzaprine (FLEXERIL) 10 MG tablet Take a half to a full tab every 8-12 hours only as needed for muscle spasm, may cause sedation. 45 tablet 0  . hydrochlorothiazide (HYDRODIURIL) 25 MG tablet TAKE 1 TABLET (25 MG TOTAL) BY MOUTH DAILY. 90 tablet 2  . hydrOXYzine (ATARAX/VISTARIL) 10 MG tablet Take 10 mg by mouth 3 (three) times daily as needed  for itching.     [name] ipratropium (ATROVENT) 0.06 % nasal spray Place 2 sprays into both nostrils 4 (four) times daily. 15 mL 1  . metroNIDAZOLE (METROGEL) 0.75 % vaginal gel Place 1 Applicatorful vaginally 2 (two) times daily. 70 g 6  . montelukast (SINGULAIR) 10 MG tablet Take 10 mg by mouth at bedtime.     . pantoprazole (PROTONIX) 40 MG tablet TAKE 1 TABLET (40 MG TOTAL) BY MOUTH DAILY. 90 tablet 1  . triamcinolone (KENALOG) 0.1 % paste Use as directed 1 application in the mouth or throat 2 (two) times daily. For one week. 5 g 1  . triamcinolone cream (KENALOG) 0.1 % Apply 1 application topically 2 (two) times daily. 453.611 g 0   No current facility-administered medications for this visit.   Allergies  Allergen Reactions  . Aspirin Rash    Patient indicates increases Urticaria.   . Azithromycin Nausea And Vomiting  . Morphine And Related Rash    Urticaria   . Clindamycin/Lincomycin     Facial swelling  . Cortisporin [Bacitra-Neomycin-Polymyxin-Hc]   . Erythromycin Nausea And Vomiting    Has taken azithromycin with no issues in the past.   . Metronidazole     Rash  . Nsaids Swelling     Exam:  BP 107/91 mmHg  Pulse 102  Temp(Src) 99.3 F (37.4 C) (Oral)  Wt 193 lb (87.544 kg)  SpO2 100% Gen: Well lady in NAD HEENT: EOMI,  MMM. TM intact, gray, translucent, normal position bilaterally.  OP without erythema or exudate. Submental swelling and tender along the jaw and the anterior LN chain. OP without erythema or exudate. Mildly tender maxillary sinuses laterally.  Lungs: Normal work of breathing. CTABL Heart: RRR no MRG Abd: NABS, Soft. Nondistended, Nontender Exts: Brisk capillary refill, warm and well perfused.   No results found for this or any previous visit (from the past 24 hour(s)). No results found.   Please see individual assessment and plan sections.

## 2016-01-15 ENCOUNTER — Encounter: Payer: Self-pay | Admitting: Family Medicine

## 2016-01-15 ENCOUNTER — Ambulatory Visit (INDEPENDENT_AMBULATORY_CARE_PROVIDER_SITE_OTHER): Payer: Medicare Other | Admitting: Family Medicine

## 2016-01-15 VITALS — BP 119/73 | HR 90 | Temp 98.0°F | Wt 194.0 lb

## 2016-01-15 DIAGNOSIS — L505 Cholinergic urticaria: Secondary | ICD-10-CM | POA: Diagnosis not present

## 2016-01-15 DIAGNOSIS — J069 Acute upper respiratory infection, unspecified: Secondary | ICD-10-CM

## 2016-01-15 MED ORDER — PREDNISONE 5 MG (48) PO TBPK: ORAL_TABLET | ORAL | Status: DC

## 2016-01-15 NOTE — Assessment & Plan Note (Addendum)
Refill prednisone. Refer to Allergy/Immunology for further evaluation and management of this problem.

## 2016-01-15 NOTE — Progress Notes (Signed)
Janet Richards is a 55 y.o. female who presents to [facility] today for chronic urticaria and runny nose.  Runny nose: Patient notes her entire family has been sick recently. She had some mild subjective fevers and runny nose. She's worried that she might be getting sick. This is been going on for a day or 2. She has not tried any medications yet. No measured fevers chills vomiting or diarrhea.  Chronic urticaria: Patient has a history of chronic urticaria. She's had repeated doses of prednisone in the past which has helped some. She notes her urticaria has returned. She has used some over-the-counter medicines which have helped.  Past Medical History  Diagnosis Date  . Urticaria   . Essential hypertension, benign   . Primary osteoarthritis of both knees   . Menstrual migraine    Past Surgical History  Procedure Laterality Date  . Tubal ligation  12/26/1987   Social History  Substance Use Topics  . Smoking status: Former Smoker -- 1.50 packs/day    Types: Cigarettes    Quit date: 02/05/2006  . Smokeless tobacco: Never Used  . Alcohol Use: Yes     Comment: rarely   family history includes CAD in her father; Hypertension in her mother.  ROS as above Medications: Current Outpatient Prescriptions  Medication Sig Dispense Refill  . albuterol (PROVENTIL HFA;VENTOLIN HFA) 108 (90 BASE) MCG/ACT inhaler Inhale two puffs every 4-6 hours only as needed for shortness of breath or wheezing. 1 Inhaler 1  . cetirizine (ZYRTEC) 1 MG/ML syrup TAKE 10 MLS (10 MG TOTAL) BY MOUTH DAILY. 900 mL 0  . ciprofloxacin (CIPRO) 500 MG tablet Take 1 tablet (500 mg total) by mouth 2 (two) times daily. 14 tablet 0  . cyclobenzaprine (FLEXERIL) 10 MG tablet Take a half to a full tab every 8-12 hours only as needed for muscle spasm, may cause sedation. 45 tablet 0  . hydrochlorothiazide (HYDRODIURIL) 25  MG tablet TAKE 1 TABLET (25 MG TOTAL) BY MOUTH DAILY. 90 tablet 2  . hydrOXYzine (ATARAX/VISTARIL) 10 MG tablet Take 10 mg by mouth 3 (three) times daily as needed for itching.     [name] ipratropium (ATROVENT) 0.06 % nasal spray Place 2 sprays into both nostrils 4 (four) times daily. 15 mL 1  . metroNIDAZOLE (METROGEL) 0.75 % vaginal gel Place 1 Applicatorful vaginally 2 (two) times daily. 70 g 6  . montelukast (SINGULAIR) 10 MG tablet Take 10 mg by mouth at bedtime.     . pantoprazole (PROTONIX) 40 MG tablet TAKE 1 TABLET (40 MG TOTAL) BY MOUTH DAILY. 90 tablet 1  . predniSONE (STERAPRED UNI-PAK 48 TAB) 5 MG (48) TBPK tablet 12 day dosepack po 48 tablet 1  . triamcinolone (KENALOG) 0.1 % paste Use as directed 1 application in the mouth or throat 2 (two) times daily. For one week. 5 g 1  . triamcinolone cream (KENALOG) 0.1 % Apply 1 application topically 2 (two) times daily. 453.611 g 0   No current facility-administered medications for this visit.   Allergies  Allergen Reactions  . Aspirin Rash    Patient indicates increases Urticaria.   . Azithromycin Nausea And Vomiting  . Morphine And Related Rash    Urticaria   . Clindamycin/Lincomycin     Facial swelling  . Cortisporin [Bacitra-Neomycin-Polymyxin-Hc]   . Erythromycin Nausea And Vomiting    Has taken azithromycin with no issues in the past.   . Metronidazole  Rash  . Nsaids Swelling     Exam:  BP 119/73 mmHg  Pulse 90  Temp(Src) 98 F (36.7 C) (Oral)  Wt 194 lb (87.998 kg)  SpO2 100% Gen: Well NAD HEENT: EOMI,  MMM Clear nasal discharge. Posterior pharynx with cobblestoning. Lungs: Normal work of breathing. CTABL Heart: RRR no MRG Abd: NABS, Soft. Nondistended, Nontender Exts: Brisk capillary refill, warm and well perfused. . Skin: Several lesions of urticaria on arms bilaterally. Nontender.  No results found for this or any previous visit (from the past 24 hour(s)). No results found.   Please see individual  assessment and plan sections.

## 2016-01-15 NOTE — Assessment & Plan Note (Signed)
Patient has new URI sx. Plan for atrovent nasal spray and tylenol.  Return as needed.

## 2016-01-15 NOTE — Patient Instructions (Signed)
Thank you for coming in today. Return as needed.  Take prednisone as needed.  Use tylenol as needed as well as the nasal spray.   We will refer to rheumatology.

## 2016-01-19 ENCOUNTER — Encounter: Payer: Self-pay | Admitting: Family Medicine

## 2016-01-19 ENCOUNTER — Ambulatory Visit (INDEPENDENT_AMBULATORY_CARE_PROVIDER_SITE_OTHER): Payer: Medicare Other | Admitting: Family Medicine

## 2016-01-19 VITALS — BP 121/81 | HR 105 | Temp 98.2°F | Wt 192.0 lb

## 2016-01-19 DIAGNOSIS — J4520 Mild intermittent asthma, uncomplicated: Secondary | ICD-10-CM

## 2016-01-19 DIAGNOSIS — R05 Cough: Secondary | ICD-10-CM | POA: Diagnosis not present

## 2016-01-19 DIAGNOSIS — R059 Cough, unspecified: Secondary | ICD-10-CM

## 2016-01-19 MED ORDER — BENZONATATE 200 MG PO CAPS: 200.0000 mg | ORAL_CAPSULE | Freq: Three times a day (TID) | ORAL | Status: DC | PRN

## 2016-01-19 MED ORDER — CEFDINIR 300 MG PO CAPS: 300.0000 mg | ORAL_CAPSULE | Freq: Two times a day (BID) | ORAL | Status: DC

## 2016-01-19 MED ORDER — ALBUTEROL SULFATE HFA 108 (90 BASE) MCG/ACT IN AERS: INHALATION_SPRAY | RESPIRATORY_TRACT | Status: DC

## 2016-01-19 NOTE — Patient Instructions (Signed)
Thank you for coming in today. Take the antibiotic.  Use albuterol and cough medicine as needed. Use atrovent nasal spray.  Return as needed.  Call or go to the emergency room if you get worse, have trouble breathing, have chest pains, or palpitations.   Acute Bronchitis Bronchitis is inflammation of the airways that extend from the windpipe into the lungs (bronchi). The inflammation often causes mucus to develop. This leads to a cough, which is the most common symptom of bronchitis.  In acute bronchitis, the condition usually develops suddenly and goes away over time, usually in a couple weeks. Smoking, allergies, and asthma can make bronchitis worse. Repeated episodes of bronchitis may cause further lung problems.  CAUSES Acute bronchitis is most often caused by the same virus that causes a cold. The virus can spread from person to person (contagious) through coughing, sneezing, and touching contaminated objects. SIGNS AND SYMPTOMS   Cough.   Fever.   Coughing up mucus.   Body aches.   Chest congestion.   Chills.   Shortness of breath.   Sore throat.  DIAGNOSIS  Acute bronchitis is usually diagnosed through a physical exam. Your health care provider will also ask you questions about your medical history. Tests, such as chest X-rays, are sometimes done to rule out other conditions.  TREATMENT  Acute bronchitis usually goes away in a couple weeks. Oftentimes, no medical treatment is necessary. Medicines are sometimes given for relief of fever or cough. Antibiotic medicines are usually not needed but may be prescribed in certain situations. In some cases, an inhaler may be recommended to help reduce shortness of breath and control the cough. A cool mist vaporizer may also be used to help thin bronchial secretions and make it easier to clear the chest.  HOME CARE INSTRUCTIONS  Get plenty of rest.   Drink enough fluids to keep your urine clear or pale yellow (unless you  have a medical condition that requires fluid restriction). Increasing fluids may help thin your respiratory secretions (sputum) and reduce chest congestion, and it will prevent dehydration.   Take medicines only as directed by your health care provider.  If you were prescribed an antibiotic medicine, finish it all even if you start to feel better.  Avoid smoking and secondhand smoke. Exposure to cigarette smoke or irritating chemicals will make bronchitis worse. If you are a smoker, consider using nicotine gum or skin patches to help control withdrawal symptoms. Quitting smoking will help your lungs heal faster.   Reduce the chances of another bout of acute bronchitis by washing your hands frequently, avoiding people with cold symptoms, and trying not to touch your hands to your mouth, nose, or eyes.   Keep all follow-up visits as directed by your health care provider.  SEEK MEDICAL CARE IF: Your symptoms do not improve after 1 week of treatment.  SEEK IMMEDIATE MEDICAL CARE IF:  You develop an increased fever or chills.   You have chest pain.   You have severe shortness of breath.  You have bloody sputum.   You develop dehydration.  You faint or repeatedly feel like you are going to pass out.  You develop repeated vomiting.  You develop a severe headache. MAKE SURE YOU:   Understand these instructions.  Will watch your condition.  Will get help right away if you are not doing well or get worse.   This information is not intended to replace advice given to you by your health care provider. Make sure you  discuss any questions you have with your health care provider.   Document Released: 12/01/2004 Document Revised: 11/14/2014 Document Reviewed: 04/16/2013 Elsevier Interactive Patient Education [employer].

## 2016-01-19 NOTE — Progress Notes (Signed)
Janet Richards is a 55 y.o. female who presents to [facility] today for cough congestion chills sore throat and runny nose. Patient was seen 4 days ago for mild cough. Her symptoms have worsened since. She has not tried any prednisone yet. No fevers or vomiting or diarrhea. She has tried some over-the-counter medicines which have helped.   Past Medical History  Diagnosis Date  . Urticaria   . Essential hypertension, benign   . Primary osteoarthritis of both knees   . Menstrual migraine    Past Surgical History  Procedure Laterality Date  . Tubal ligation  12/26/1987   Social History  Substance Use Topics  . Smoking status: Former Smoker -- 1.50 packs/day    Types: Cigarettes    Quit date: 02/05/2006  . Smokeless tobacco: Never Used  . Alcohol Use: Yes     Comment: rarely   family history includes CAD in her father; Hypertension in her mother.  ROS as above Medications: Current Outpatient Prescriptions  Medication Sig Dispense Refill  . albuterol (PROVENTIL HFA;VENTOLIN HFA) 108 (90 Base) MCG/ACT inhaler Inhale two puffs every 4-6 hours only as needed for shortness of breath or wheezing. 1 Inhaler 1  . cetirizine (ZYRTEC) 1 MG/ML syrup TAKE 10 MLS (10 MG TOTAL) BY MOUTH DAILY. 900 mL 0  . cyclobenzaprine (FLEXERIL) 10 MG tablet Take a half to a full tab every 8-12 hours only as needed for muscle spasm, may cause sedation. 45 tablet 0  . hydrochlorothiazide (HYDRODIURIL) 25 MG tablet TAKE 1 TABLET (25 MG TOTAL) BY MOUTH DAILY. 90 tablet 2  . hydrOXYzine (ATARAX/VISTARIL) 10 MG tablet Take 10 mg by mouth 3 (three) times daily as needed for itching.     [name] ipratropium (ATROVENT) 0.06 % nasal spray Place 2 sprays into both nostrils 4 (four) times daily. 15 mL 1  . montelukast (SINGULAIR) 10 MG tablet Take 10 mg by mouth at bedtime.     . pantoprazole (PROTONIX) 40 MG tablet  TAKE 1 TABLET (40 MG TOTAL) BY MOUTH DAILY. 90 tablet 1  . triamcinolone (KENALOG) 0.1 % paste Use as directed 1 application in the mouth or throat 2 (two) times daily. For one week. 5 g 1  . triamcinolone cream (KENALOG) 0.1 % Apply 1 application topically 2 (two) times daily. 453.611 g 0  . benzonatate (TESSALON) 200 MG capsule Take 1 capsule (200 mg total) by mouth 3 (three) times daily as needed for cough. 30 capsule 1  . cefdinir (OMNICEF) 300 MG capsule Take 1 capsule (300 mg total) by mouth 2 (two) times daily. 14 capsule 1  . predniSONE (STERAPRED UNI-PAK 48 TAB) 5 MG (48) TBPK tablet 12 day dosepack po (Patient not taking: Reported on 01/19/2016) 48 tablet 1   No current facility-administered medications for this visit.   Allergies  Allergen Reactions  . Aspirin Rash    Patient indicates increases Urticaria.   . Azithromycin Nausea And Vomiting  . Morphine And Related Rash    Urticaria   . Clindamycin/Lincomycin     Facial swelling  . Cortisporin [Bacitra-Neomycin-Polymyxin-Hc]   . Erythromycin Nausea And Vomiting    Has taken azithromycin with no issues in the past.   . Metronidazole     Rash  . Nsaids Swelling     Exam:  BP 121/81 mmHg  Pulse 105  Temp(Src) 98.2 F (36.8 C) (Oral)  Wt 192 lb (87.091 kg)  SpO2 99% Gen: Well NAD  HEENT: EOMI,  MMMClear nasal discharge. Posterior pharynx with cobblestoning Lungs: Normal work of breathing. Coarse breath sounds bilaterally. Heart: RRR no MRG Abd: NABS, Soft. Nondistended, Nontender Exts: Brisk capillary refill, warm and well perfused.   No results found for this or any previous visit (from the past 24 hour(s)). No results found.   55 year old woman with cough and sore throat. Symptoms likely related to viral etiology with possible asthma component. Treat with prednisone Dosepak, albuterol, Atrovent nasal spray and Omnicef. Return if not better.

## 2016-01-31 ENCOUNTER — Other Ambulatory Visit: Payer: Self-pay | Admitting: Family Medicine

## 2016-02-04 ENCOUNTER — Other Ambulatory Visit: Payer: Self-pay | Admitting: Family Medicine

## 2016-02-05 ENCOUNTER — Ambulatory Visit (INDEPENDENT_AMBULATORY_CARE_PROVIDER_SITE_OTHER): Payer: Medicare Other | Admitting: Family Medicine

## 2016-02-05 ENCOUNTER — Encounter: Payer: Self-pay | Admitting: Family Medicine

## 2016-02-05 VITALS — BP 135/89 | HR 94 | Wt 188.0 lb

## 2016-02-05 DIAGNOSIS — K056 Periodontal disease, unspecified: Secondary | ICD-10-CM | POA: Diagnosis not present

## 2016-02-05 MED ORDER — CIPROFLOXACIN HCL 500 MG PO TABS: 500.0000 mg | ORAL_TABLET | Freq: Two times a day (BID) | ORAL | Status: DC

## 2016-02-05 NOTE — Patient Instructions (Signed)
Thank you for coming in today. Use cipro for your teeth.  Follow up with the peridontist.

## 2016-02-05 NOTE — Progress Notes (Signed)
Janet Richards is a 55 y.o. female who presents to [facility] today for tooth pain. Patient notes frequent dental infections. She's been doing very well recently but had recurrent episode of left upper tooth pain in the last few days. She has an appointment with a periodontist in the next few days. No fevers chills nausea vomiting or diarrhea.   Past Medical History  Diagnosis Date  . Urticaria   . Essential hypertension, benign   . Primary osteoarthritis of both knees   . Menstrual migraine    Past Surgical History  Procedure Laterality Date  . Tubal ligation  12/26/1987   Social History  Substance Use Topics  . Smoking status: Former Smoker -- 1.50 packs/day    Types: Cigarettes    Quit date: 02/05/2006  . Smokeless tobacco: Never Used  . Alcohol Use: Yes     Comment: rarely   family history includes CAD in her father; Hypertension in her mother.  ROS as above Medications: Current Outpatient Prescriptions  Medication Sig Dispense Refill  . albuterol (PROVENTIL HFA;VENTOLIN HFA) 108 (90 Base) MCG/ACT inhaler Inhale two puffs every 4-6 hours only as needed for shortness of breath or wheezing. 1 Inhaler 1  . cetirizine (ZYRTEC) 1 MG/ML syrup TAKE 10 MLS (10 MG TOTAL) BY MOUTH DAILY. 944 mL 6  . cyclobenzaprine (FLEXERIL) 10 MG tablet Take a half to a full tab every 8-12 hours only as needed for muscle spasm, may cause sedation. 45 tablet 0  . hydrochlorothiazide (HYDRODIURIL) 25 MG tablet Take 1 tablet (25 mg total) by mouth daily. NEED FOLLOW UP APPOINTMENT FOR MORE REFILLS 90 tablet 0  . hydrOXYzine (ATARAX/VISTARIL) 10 MG tablet Take 10 mg by mouth 3 (three) times daily as needed for itching.     [name] ipratropium (ATROVENT) 0.06 % nasal spray Place 2 sprays into both nostrils 4 (four) times daily. 15 mL 1  . montelukast (SINGULAIR) 10 MG tablet Take 10 mg by mouth at  bedtime.     . pantoprazole (PROTONIX) 40 MG tablet TAKE 1 TABLET (40 MG TOTAL) BY MOUTH DAILY. 90 tablet 1  . triamcinolone (KENALOG) 0.1 % paste Use as directed 1 application in the mouth or throat 2 (two) times daily. For one week. 5 g 1  . triamcinolone cream (KENALOG) 0.1 % Apply 1 application topically 2 (two) times daily. 453.611 g 0  . ciprofloxacin (CIPRO) 500 MG tablet Take 1 tablet (500 mg total) by mouth 2 (two) times daily. 14 tablet 1   No current facility-administered medications for this visit.   Allergies  Allergen Reactions  . Aspirin Rash    Patient indicates increases Urticaria.   . Azithromycin Nausea And Vomiting  . Morphine And Related Rash    Urticaria   . Clindamycin/Lincomycin     Facial swelling  . Cortisporin [Bacitra-Neomycin-Polymyxin-Hc]   . Erythromycin Nausea And Vomiting    Has taken azithromycin with no issues in the past.   . Metronidazole     Rash  . Nsaids Swelling     Exam:  BP 135/89 mmHg  Pulse 94  Wt 188 lb (85.276 kg) Gen: Well NAD HEENT: EOMI,  MMM Poor dentition throughout tender to palpation and swollen gums upper left no visible abscess seen. Lungs: Normal work of breathing. CTABL Heart: RRR no MRG Abd: NABS, Soft. Nondistended, Nontender Exts: Brisk capillary refill, warm and well perfused.   No results found for this or any previous  visit (from the past 24 hour(s)). No results found.   55 year old woman with periodontal disease. 3 treat with Cipro. Follow-up with peridontist.

## 2016-02-20 ENCOUNTER — Encounter: Payer: Self-pay | Admitting: Emergency Medicine

## 2016-02-20 ENCOUNTER — Emergency Department
Admission: EM | Admit: 2016-02-20 | Discharge: 2016-02-20 | Disposition: A | Discharge status: Home / Self Care | Payer: Medicare Other | Source: Home / Self Care | Attending: Family Medicine | Admitting: Family Medicine

## 2016-02-20 DIAGNOSIS — L250 Unspecified contact dermatitis due to cosmetics: Secondary | ICD-10-CM

## 2016-02-20 DIAGNOSIS — T6591XA Toxic effect of unspecified substance, accidental (unintentional), initial encounter: Secondary | ICD-10-CM | POA: Diagnosis not present

## 2016-02-20 DIAGNOSIS — T2660XA Corrosion of cornea and conjunctival sac, unspecified eye, initial encounter: Secondary | ICD-10-CM

## 2016-02-20 MED ORDER — TRIAMCINOLONE ACETONIDE 0.1 % EX CREA: 1.0000 "application " | TOPICAL_CREAM | Freq: Two times a day (BID) | CUTANEOUS | Status: DC

## 2016-02-20 MED ORDER — PREDNISOLONE ACETATE 1 % OP SUSP: 1.0000 [drp] | Freq: Four times a day (QID) | OPHTHALMIC | Status: DC

## 2016-02-20 NOTE — ED Notes (Signed)
Bi-lateral eye irritation since last night. Used new eye gel and cleanser, eyes red and irritated.

## 2016-02-20 NOTE — Discharge Instructions (Signed)
May apply cool compress to cheeks several times daily until swelling resolves.

## 2016-02-20 NOTE — ED Provider Notes (Signed)
CSN: [id number]     Arrival date & time 02/20/16  1129 History   First MD Initiated Contact with Patient 02/20/16 1207     Chief Complaint  Patient presents with  . Eye Problem      HPI Comments: Patient reports that she tried using a new eye gel and facial cleanser, and last night she developed redness, itching, and swelling of her cheeks.  She has had mild swelling of her left lower eyelid, extending to the mucosal surface.  No changes in vision.  Patient is a 55 y.o. female presenting with rash. The history is provided by the patient.  Rash Location:  Face Facial rash location:  L cheek and R cheek Quality: burning, dryness, itchiness, redness and swelling   Quality: not blistering, not peeling and not weeping   Severity:  Mild Onset quality:  Sudden Duration:  1 day Timing:  Constant Progression:  Unchanged Chronicity:  New Context comment:  New face cleanser Relieved by:  Nothing Worsened by:  Nothing tried Ineffective treatments:  None tried Associated symptoms: no fever, no induration, no periorbital edema, no sore throat, no throat swelling and no tongue swelling     Past Medical History  Diagnosis Date  . Urticaria   . Essential hypertension, benign   . Primary osteoarthritis of both knees   . Menstrual migraine    Past Surgical History  Procedure Laterality Date  . Tubal ligation  12/26/1987   Family History  Problem Relation Age of Onset  . Hypertension Mother   . CAD Father     MI in his 13s   Social History  Substance Use Topics  . Smoking status: Former Smoker -- 1.50 packs/day    Types: Cigarettes    Quit date: 02/05/2006  . Smokeless tobacco: Never Used  . Alcohol Use: Yes     Comment: rarely   OB History    Gravida Para Term Preterm AB TAB SAB Ectopic Multiple Living   5 3 3  2 2    3      Review of Systems  Constitutional: Negative for fever.  HENT: Positive for facial swelling. Negative for sore throat.   Eyes: Positive for redness and  itching. Negative for photophobia, pain, discharge and visual disturbance.  Skin: Positive for rash.  All other systems reviewed and are negative.   Allergies  Aspirin; Azithromycin; Morphine and related; Clindamycin/lincomycin; Cortisporin; Erythromycin; Metronidazole; and Nsaids  Home Medications   Prior to Admission medications   Medication Sig Start Date End Date Taking? Authorizing Provider  albuterol (PROVENTIL HFA;VENTOLIN HFA) 108 (90 Base) MCG/ACT inhaler Inhale two puffs every 4-6 hours only as needed for shortness of breath or wheezing. 01/19/16 01/18/17  [name], MD  cetirizine (ZYRTEC) 1 MG/ML syrup TAKE 10 MLS (10 MG TOTAL) BY MOUTH DAILY. 02/04/16   [name], MD  ciprofloxacin (CIPRO) 500 MG tablet Take 1 tablet (500 mg total) by mouth 2 (two) times daily. 02/05/16   [name], MD  cyclobenzaprine (FLEXERIL) 10 MG tablet Take a half to a full tab every 8-12 hours only as needed for muscle spasm, may cause sedation. 08/08/13   Sean Hommel, DO  hydrochlorothiazide (HYDRODIURIL) 25 MG tablet Take 1 tablet (25 mg total) by mouth daily. NEED FOLLOW UP APPOINTMENT FOR MORE REFILLS 02/01/16   Marcial Pacas, DO  hydrOXYzine (ATARAX/VISTARIL) 10 MG tablet Take 10 mg by mouth 3 (three) times daily as needed for itching.     Historical Provider, MD  ipratropium (ATROVENT) 0.06 % nasal spray Place 2 sprays into both nostrils 4 (four) times daily. 12/22/15   [name], MD  montelukast (SINGULAIR) 10 MG tablet Take 10 mg by mouth at bedtime.  04/04/13   Historical Provider, MD  pantoprazole (PROTONIX) 40 MG tablet TAKE 1 TABLET (40 MG TOTAL) BY MOUTH DAILY. 02/05/16   [name], MD  prednisoLONE acetate (PRED FORTE) 1 % ophthalmic suspension Place 1 drop into the left eye 4 (four) times daily. Use for 1 to 2 days until improved. 02/20/16   Kandra Nicolas, MD  triamcinolone (KENALOG) 0.1 % paste Use as directed 1 application in the mouth or throat 2 (two) times daily. For one week.  06/17/14   Marcial Pacas, DO  triamcinolone cream (KENALOG) 0.1 % Apply 1 application topically 2 (two) times daily. Use for 2 to 4 days until improved 02/20/16   Kandra Nicolas, MD   Meds Ordered and Administered this Visit  Medications - No data to display  BP 114/77 mmHg  Pulse 88  Temp(Src) 98.1 F (36.7 C) (Oral)  Ht 5\' 3"  (1.6 m)  Wt 186 lb (84.369 kg)  BMI 32.96 kg/m2  SpO2 97%  LMP 02/09/2016 No data found.   Physical Exam  Constitutional: She appears well-developed and well-nourished. No distress.  HENT:  Head: Normocephalic.    Right Ear: External ear normal.  Left Ear: External ear normal.  Nose: Nose normal.  Mouth/Throat: Oropharynx is clear and moist.  There is mild swelling and erythema of cheeks bilaterally without tenderness to palpation.  Eyes: Conjunctivae and EOM are normal. Pupils are equal, round, and reactive to light. Right eye exhibits no discharge. Left eye exhibits no discharge.    There is mild tenderness to palpation and erythema of the left lower eyelid extending to mucosal surface.  No involvement of conjunctivae.  Neck: Neck supple.  Lymphadenopathy:    She has no cervical adenopathy.  Nursing note and vitals reviewed.   ED Course  Procedures none  MDM   1. Chemical injury of conjunctiva, initial encounter   2. Contact dermatitis due to cosmetics    Begin Pred Forte suspension for one to 2 days until improved. Begin Triamcinolone 0.1% cream to cheeks BID for 2 to 4 days until improved. Followup with Family Doctor if not improved in about 3 to 4 days.    Kandra Nicolas, MD 02/22/16 [phone]

## 2016-02-24 ENCOUNTER — Ambulatory Visit (INDEPENDENT_AMBULATORY_CARE_PROVIDER_SITE_OTHER): Payer: Medicare Other | Admitting: Family Medicine

## 2016-02-24 ENCOUNTER — Encounter: Payer: Self-pay | Admitting: Family Medicine

## 2016-02-24 ENCOUNTER — Ambulatory Visit (INDEPENDENT_AMBULATORY_CARE_PROVIDER_SITE_OTHER): Payer: Medicare Other

## 2016-02-24 VITALS — BP 151/86 | HR 91 | Wt 192.0 lb

## 2016-02-24 DIAGNOSIS — M25571 Pain in right ankle and joints of right foot: Secondary | ICD-10-CM

## 2016-02-24 DIAGNOSIS — D509 Iron deficiency anemia, unspecified: Secondary | ICD-10-CM

## 2016-02-24 DIAGNOSIS — I10 Essential (primary) hypertension: Secondary | ICD-10-CM | POA: Diagnosis not present

## 2016-02-24 DIAGNOSIS — M25579 Pain in unspecified ankle and joints of unspecified foot: Secondary | ICD-10-CM | POA: Diagnosis not present

## 2016-02-24 DIAGNOSIS — R5382 Chronic fatigue, unspecified: Secondary | ICD-10-CM

## 2016-02-24 DIAGNOSIS — G8929 Other chronic pain: Secondary | ICD-10-CM

## 2016-02-24 DIAGNOSIS — M7989 Other specified soft tissue disorders: Secondary | ICD-10-CM | POA: Diagnosis not present

## 2016-02-24 DIAGNOSIS — R5383 Other fatigue: Secondary | ICD-10-CM | POA: Insufficient documentation

## 2016-02-24 HISTORY — DX: Other chronic pain: G89.29

## 2016-02-24 NOTE — Patient Instructions (Signed)
Thank you for coming in today. Get labs and xray today.  We will call with results.   Fatigue Fatigue is feeling tired all of the time, a lack of energy, or a lack of motivation. Occasional or mild fatigue is often a normal response to activity or life in general. However, long-lasting (chronic) or extreme fatigue may indicate an underlying medical condition. HOME CARE INSTRUCTIONS  Watch your fatigue for any changes. The following actions may help to lessen any discomfort you are feeling:  Talk to your health care provider about how much sleep you need each night. Try to get the required amount every night.  Take medicines only as directed by your health care provider.  Eat a healthy and nutritious diet. Ask your health care provider if you need help changing your diet.  Drink enough fluid to keep your urine clear or pale yellow.  Practice ways of relaxing, such as yoga, meditation, massage therapy, or acupuncture.  Exercise regularly.   Change situations that cause you stress. Try to keep your work and personal routine reasonable.  Do not abuse illegal drugs.  Limit alcohol intake to no more than 1 drink per day for nonpregnant women and 2 drinks per day for men. One drink equals 12 ounces of beer, 5 ounces of wine, or 1 ounces of hard liquor.  Take a multivitamin, if directed by your health care provider. SEEK MEDICAL CARE IF:   Your fatigue does not get better.  You have a fever.   You have unintentional weight loss or gain.  You have headaches.   You have difficulty:   Falling asleep.  Sleeping throughout the night.  You feel angry, guilty, anxious, or sad.   You are unable to have a bowel movement (constipation).   You skin is dry.   Your legs or another part of your body is swollen.  SEEK IMMEDIATE MEDICAL CARE IF:   You feel confused.   Your vision is blurry.  You feel faint or pass out.   You have a severe headache.   You have severe  abdominal, pelvic, or back pain.   You have chest pain, shortness of breath, or an irregular or fast heartbeat.   You are unable to urinate or you urinate less than normal.   You develop abnormal bleeding, such as bleeding from the rectum, vagina, nose, lungs, or nipples.  You vomit blood.   You have thoughts about harming yourself or committing suicide.   You are worried that you might harm someone else.    This information is not intended to replace advice given to you by your health care provider. Make sure you discuss any questions you have with your health care provider.   Document Released: 08/21/2007 Document Revised: 11/14/2014 Document Reviewed: 02/25/2014 Elsevier Interactive Patient Education [employer].

## 2016-02-25 DIAGNOSIS — D509 Iron deficiency anemia, unspecified: Secondary | ICD-10-CM | POA: Insufficient documentation

## 2016-02-25 LAB — IRON AND TIBC
%SAT: 8 % — ABNORMAL LOW (ref 11–50)
Iron: 29 ug/dL — ABNORMAL LOW (ref 45–160)
TIBC: 349 ug/dL (ref 250–450)
UIBC: 320 ug/dL (ref 125–400)

## 2016-02-25 LAB — COMPREHENSIVE METABOLIC PANEL
ALT: 17 U/L (ref 6–29)
AST: 14 U/L (ref 10–35)
Albumin: 4 g/dL (ref 3.6–5.1)
Alkaline Phosphatase: 67 U/L (ref 33–130)
BUN: 11 mg/dL (ref 7–25)
CO2: 29 mmol/L (ref 20–31)
Calcium: 9 mg/dL (ref 8.6–10.4)
Chloride: 100 mmol/L (ref 98–110)
Creat: 0.89 mg/dL (ref 0.50–1.05)
Glucose, Bld: 104 mg/dL — ABNORMAL HIGH (ref 65–99)
Potassium: 3.4 mmol/L — ABNORMAL LOW (ref 3.5–5.3)
Sodium: 139 mmol/L (ref 135–146)
Total Bilirubin: 0.3 mg/dL (ref 0.2–1.2)
Total Protein: 6.5 g/dL (ref 6.1–8.1)

## 2016-02-25 LAB — CBC
HCT: 37.7 % (ref 35.0–45.0)
Hemoglobin: 12.1 g/dL (ref 11.7–15.5)
MCH: 28.5 pg (ref 27.0–33.0)
MCHC: 32.1 g/dL (ref 32.0–36.0)
MCV: 88.9 fL (ref 80.0–100.0)
MPV: 9.6 fL (ref 7.5–12.5)
Platelets: 515 10*3/uL — ABNORMAL HIGH (ref 140–400)
RBC: 4.24 MIL/uL (ref 3.80–5.10)
RDW: 15.7 % — ABNORMAL HIGH (ref 11.0–15.0)
WBC: 6.5 10*3/uL (ref 3.8–10.8)

## 2016-02-25 LAB — TSH: TSH: 2.49 mIU/L

## 2016-02-25 LAB — FERRITIN: Ferritin: 16 ng/mL (ref 10–232)

## 2016-02-25 LAB — URIC ACID: Uric Acid, Serum: 6.2 mg/dL (ref 2.4–7.0)

## 2016-02-25 NOTE — Assessment & Plan Note (Signed)
Unclear etiology. If symptoms persist would consider aspiration and injection. Suspicious for gout.

## 2016-02-25 NOTE — Progress Notes (Signed)
Janet Richards is a 55 y.o. female who presents to [place]: [facility] today for right ankle pain.  Patient notes a several day history of right ankle pain and swelling without injury. She notes pain is located predominantly at the medial and anterior portion of the ankle joint.   Additionally patient notes fatigue. This is been ongoing now for about 2 weeks. She denies any snoring. She feels tired in the mornings. She is able to sleep normally.  Otherwise she feels well with no fevers chills nausea vomiting or diarrhea. Of note she was recently seen in the urgent care for eye itching and swelling. This resolved quickly with Pred Forte eyedrops for which she is no longer using.   Past Medical History  Diagnosis Date  . Urticaria   . Essential hypertension, benign   . Primary osteoarthritis of both knees   . Menstrual migraine    Past Surgical History  Procedure Laterality Date  . Tubal ligation  12/26/1987   Social History  Substance Use Topics  . Smoking status: Former Smoker -- 1.50 packs/day    Types: Cigarettes    Quit date: 02/05/2006  . Smokeless tobacco: Never Used  . Alcohol Use: Yes     Comment: rarely   family history includes CAD in her father; Hypertension in her mother.  ROS as above Medications: Current Outpatient Prescriptions  Medication Sig Dispense Refill  . albuterol (PROVENTIL HFA;VENTOLIN HFA) 108 (90 Base) MCG/ACT inhaler Inhale two puffs every 4-6 hours only as needed for shortness of breath or wheezing. 1 Inhaler 1  . cetirizine (ZYRTEC) 1 MG/ML syrup TAKE 10 MLS (10 MG TOTAL) BY MOUTH DAILY. 944 mL 6  . cyclobenzaprine (FLEXERIL) 10 MG tablet Take a half to a full tab every 8-12 hours only as needed for muscle spasm, may cause sedation. 45 tablet 0  . hydrochlorothiazide (HYDRODIURIL) 25 MG tablet Take 1 tablet (25 mg total) by mouth daily. NEED FOLLOW UP  APPOINTMENT FOR MORE REFILLS 90 tablet 0  . hydrOXYzine (ATARAX/VISTARIL) 10 MG tablet Take 10 mg by mouth 3 (three) times daily as needed for itching.     [name] ipratropium (ATROVENT) 0.06 % nasal spray Place 2 sprays into both nostrils 4 (four) times daily. 15 mL 1  . montelukast (SINGULAIR) 10 MG tablet Take 10 mg by mouth at bedtime.     . pantoprazole (PROTONIX) 40 MG tablet TAKE 1 TABLET (40 MG TOTAL) BY MOUTH DAILY. 90 tablet 1  . prednisoLONE acetate (PRED FORTE) 1 % ophthalmic suspension Place 1 drop into the left eye 4 (four) times daily. Use for 1 to 2 days until improved. 1 mL 0  . triamcinolone (KENALOG) 0.1 % paste Use as directed 1 application in the mouth or throat 2 (two) times daily. For one week. 5 g 1  . triamcinolone cream (KENALOG) 0.1 % Apply 1 application topically 2 (two) times daily. Use for 2 to 4 days until improved 15 g 0   No current facility-administered medications for this visit.   Allergies  Allergen Reactions  . Aspirin Rash    Patient indicates increases Urticaria.   . Azithromycin Nausea And Vomiting  . Morphine And Related Rash    Urticaria   . Clindamycin/Lincomycin     Facial swelling  . Cortisporin [Bacitra-Neomycin-Polymyxin-Hc]   . Erythromycin Nausea And Vomiting    Has taken azithromycin with no issues in the past.   . Metronidazole  Rash  . Nsaids Swelling     Exam:  BP 151/86 mmHg  Pulse 91  Wt 192 lb (87.091 kg)  LMP 02/09/2016 Gen: Well NAD HEENT: EOMI,  MMM Lungs: Normal work of breathing. CTABL Heart: RRR no MRG Abd: NABS, Soft. Nondistended, Nontender Exts: Brisk capillary refill, warm and well perfused.  Right ankle mildly swollen tender to palpation anterior and medial portion of the ankle. Normal motion stable ligamentous exam pulses capillary refill and sensation are intact distally.  Results for orders placed or performed in visit on 02/24/16 (from the past 24 hour(s))  CBC     Status: Abnormal   Collection Time:  02/24/16  4:28 PM  Result Value Ref Range   WBC 6.5 3.8 - 10.8 K/uL   RBC 4.24 3.80 - 5.10 MIL/uL   Hemoglobin 12.1 11.7 - 15.5 g/dL   HCT 37.7 35.0 - 45.0 %   MCV 88.9 80.0 - 100.0 fL   MCH 28.5 27.0 - 33.0 pg   MCHC 32.1 32.0 - 36.0 g/dL   RDW 15.7 (H) 11.0 - 15.0 %   Platelets 515 (H) 140 - 400 K/uL   MPV 9.6 7.5 - 12.5 fL   Narrative   Performed at:  [facility], [id number]                [place], [place] [address]  Comprehensive metabolic panel     Status: Abnormal   Collection Time: 02/24/16  4:28 PM  Result Value Ref Range   Sodium 139 135 - 146 mmol/L   Potassium 3.4 (L) 3.5 - 5.3 mmol/L   Chloride 100 98 - 110 mmol/L   CO2 29 20 - 31 mmol/L   Glucose, Bld 104 (H) 65 - 99 mg/dL   BUN 11 7 - 25 mg/dL   Creat 0.89 0.50 - 1.05 mg/dL   Total Bilirubin 0.3 0.2 - 1.2 mg/dL   Alkaline Phosphatase 67 33 - 130 U/L   AST 14 10 - 35 U/L   ALT 17 6 - 29 U/L   Total Protein 6.5 6.1 - 8.1 g/dL   Albumin 4.0 3.6 - 5.1 g/dL   Calcium 9.0 8.6 - 10.4 mg/dL   Narrative   Performed at:  [facility], [id number]                [place], [place] [address]  TSH     Status: None   Collection Time: 02/24/16  4:28 PM  Result Value Ref Range   TSH 2.49 mIU/L   Narrative   Performed at:  [facility], [id number]                [place],  [address]  Ferritin     Status: None   Collection Time: 02/24/16  4:28 PM  Result Value Ref Range   Ferritin 16 10 - 232 ng/mL   Narrative   Performed at:  [facility], [id number]                [place], [place] [address]  Iron Binding Cap (  TIBC)     Status: Abnormal   Collection Time: 02/24/16  4:28 PM  Result Value Ref Range   Iron 29 (L) 45 - 160 ug/dL   UIBC 320 125 - 400 ug/dL   TIBC 349 250 - 450 ug/dL   %SAT 8 (L) 11 - 50 %   Narrative   Performed at:  [facility], [id number]                [place], [place] [address] HAS THE PATIENT FASTED?->NO; SOURCE: BLD&BLOOD; HAS THE PATI  Uric acid     Status: None   Collection Time: 02/24/16  4:28 PM  Result Value Ref Range   Uric Acid, Serum 6.2 2.4 - 7.0 mg/dL   Narrative   Performed at:  [facility], [id number]                [place], [place] [address]   Dg Ankle Complete Right  02/24/2016  CLINICAL DATA:  Right ankle pain and swelling. EXAM: RIGHT ANKLE - COMPLETE 3+ VIEW COMPARISON:  None. FINDINGS: There is no evidence of fracture, dislocation, or joint effusion. There is no evidence of arthropathy or other focal bone abnormality. Soft tissues are unremarkable. IMPRESSION: Negative. Electronically Signed   By: Aletta Edouard M.D.   On: 02/24/2016 16:53     Please see individual assessment and plan sections.

## 2016-02-25 NOTE — Assessment & Plan Note (Signed)
Blood pressure mildly elevated. Recheck in the near future.

## 2016-02-25 NOTE — Assessment & Plan Note (Signed)
Possibly due to iron deficiency anemia. Start oral iron repletion. Follow-up soon.

## 2016-02-25 NOTE — Assessment & Plan Note (Signed)
Obtain Hemoccult blood testing to workup cause of iron deficiency. Start oral iron repletion.

## 2016-02-25 NOTE — Progress Notes (Signed)
Quick Note:  Xray is pretty normal looking. No broken bones. ______

## 2016-02-26 NOTE — Progress Notes (Signed)
Quick Note:  Janet Richards has iron deficiency anemia. This can cause fatigue. Ebony please order the hemoccult stool kit. Also Janet Harbeck should take over the counter iron sulfate 384m 2-3 times daily. ______

## 2016-03-03 ENCOUNTER — Telehealth: Payer: Self-pay

## 2016-03-03 NOTE — Telephone Encounter (Signed)
Pt sent in the 3 day hemoccult sample with just one day completed and no date on the card. Discarded sample. Called and advised pt that I placed another kit up front for pick up and that she would need to collect samples over 3 days. Also advised the importance of properly labeling each sample with name, dob, and collection date. Pt verbalized understanding.  

## 2016-03-10 ENCOUNTER — Ambulatory Visit (INDEPENDENT_AMBULATORY_CARE_PROVIDER_SITE_OTHER): Payer: Medicare Other

## 2016-03-10 ENCOUNTER — Encounter: Payer: Self-pay | Admitting: Family Medicine

## 2016-03-10 ENCOUNTER — Ambulatory Visit (INDEPENDENT_AMBULATORY_CARE_PROVIDER_SITE_OTHER): Payer: Medicare Other | Admitting: Family Medicine

## 2016-03-10 VITALS — BP 106/68 | HR 105 | Ht 63.0 in | Wt 185.0 lb

## 2016-03-10 DIAGNOSIS — R05 Cough: Secondary | ICD-10-CM | POA: Diagnosis not present

## 2016-03-10 DIAGNOSIS — Z87891 Personal history of nicotine dependence: Secondary | ICD-10-CM

## 2016-03-10 DIAGNOSIS — R059 Cough, unspecified: Secondary | ICD-10-CM

## 2016-03-10 MED ORDER — LEVOFLOXACIN 500 MG PO TABS: 500.0000 mg | ORAL_TABLET | Freq: Every day | ORAL | Status: DC

## 2016-03-10 MED ORDER — PREDNISONE 10 MG PO TABS: 30.0000 mg | ORAL_TABLET | Freq: Every day | ORAL | Status: DC

## 2016-03-10 NOTE — Progress Notes (Signed)
Quick Note:  Normal, no changes. ______ 

## 2016-03-10 NOTE — Patient Instructions (Signed)
Thank you for coming in today. Call or go to the emergency room if you get worse, have trouble breathing, have chest pains, or palpitations.  Take prednione and use levaquin.  Use the nasal spray.  Return as needed.

## 2016-03-10 NOTE — Progress Notes (Signed)
Janet Richards is a 55 y.o. female who presents to [facility]: [facility] today for cough congestion and runny nose sinus pain and pressure. No fevers chills vomiting or diarrhea. She's tried some over-the-counter medicines have not helped. She feels well otherwise.   Past Medical History  Diagnosis Date  . Urticaria   . Essential hypertension, benign   . Primary osteoarthritis of both knees   . Menstrual migraine    Past Surgical History  Procedure Laterality Date  . Tubal ligation  12/26/1987   Social History  Substance Use Topics  . Smoking status: Former Smoker -- 1.50 packs/day    Types: Cigarettes    Quit date: 02/05/2006  . Smokeless tobacco: Never Used  . Alcohol Use: Yes     Comment: rarely   family history includes CAD in her father; Hypertension in her mother.  ROS as above Medications: Current Outpatient Prescriptions  Medication Sig Dispense Refill  . albuterol (PROVENTIL HFA;VENTOLIN HFA) 108 (90 Base) MCG/ACT inhaler Inhale two puffs every 4-6 hours only as needed for shortness of breath or wheezing. 1 Inhaler 1  . cetirizine (ZYRTEC) 1 MG/ML syrup TAKE 10 MLS (10 MG TOTAL) BY MOUTH DAILY. 944 mL 6  . cyclobenzaprine (FLEXERIL) 10 MG tablet Take a half to a full tab every 8-12 hours only as needed for muscle spasm, may cause sedation. 45 tablet 0  . ferrous sulfate 325 (65 FE) MG tablet Take 325 mg by mouth daily with breakfast.    . Garlic 123XX123 MG CAPS Take by mouth.    . hydrochlorothiazide (HYDRODIURIL) 25 MG tablet Take 1 tablet (25 mg total) by mouth daily. NEED FOLLOW UP APPOINTMENT FOR MORE REFILLS 90 tablet 0  . hydrOXYzine (ATARAX/VISTARIL) 10 MG tablet Take 10 mg by mouth 3 (three) times daily as needed for itching.     [name] ipratropium (ATROVENT) 0.06 % nasal spray Place 2 sprays into both nostrils 4 (four) times daily. 15 mL 1  . montelukast (SINGULAIR) 10  MG tablet Take 10 mg by mouth at bedtime.     . pantoprazole (PROTONIX) 40 MG tablet TAKE 1 TABLET (40 MG TOTAL) BY MOUTH DAILY. 90 tablet 1  . triamcinolone (KENALOG) 0.1 % paste Use as directed 1 application in the mouth or throat 2 (two) times daily. For one week. 5 g 1  . triamcinolone cream (KENALOG) 0.1 % Apply 1 application topically 2 (two) times daily. Use for 2 to 4 days until improved 15 g 0  . levofloxacin (LEVAQUIN) 500 MG tablet Take 1 tablet (500 mg total) by mouth daily. 7 tablet 0  . predniSONE (DELTASONE) 10 MG tablet Take 3 tablets (30 mg total) by mouth daily with breakfast. 15 tablet 0   No current facility-administered medications for this visit.   Allergies  Allergen Reactions  . Aspirin Rash    Patient indicates increases Urticaria.   . Azithromycin Nausea And Vomiting  . Morphine And Related Rash    Urticaria   . Clindamycin/Lincomycin     Facial swelling  . Cortisporin [Bacitra-Neomycin-Polymyxin-Hc]   . Erythromycin Nausea And Vomiting    Has taken azithromycin with no issues in the past.   . Metronidazole     Rash  . Nsaids Swelling     Exam:  BP 106/68 mmHg  Pulse 105  Ht 5\' 3"  (1.6 m)  Wt 185 lb (83.915 kg)  BMI 32.78 kg/m2  LMP 02/09/2016 Gen: Well NAD HEENT: EOMI,  MMM Clear nasal discharge. Posterior pharynx with cobblestoning. Mildly tender palpation bilateral maxillary sinuses.  Lungs: Normal work of breathing. CTABL Heart: RRR no MRG Abd: NABS, Soft. Nondistended, Nontender Exts: Brisk capillary refill, warm and well perfused.   No results found for this or any previous visit (from the past 24 hour(s)). Dg Chest 2 View  03/10/2016  CLINICAL DATA:  2-3 days of cough, former smoker. EXAM: CHEST  2 VIEW COMPARISON:  Chest x-ray of November 26, 2014 FINDINGS: The lungs are adequately inflated and clear. The heart and pulmonary vascularity are normal. The mediastinum is normal in width. The bony thorax exhibits no acute abnormality. IMPRESSION:  There is no active cardiopulmonary disease. Electronically Signed   By: David  Martinique M.D.   On: 03/10/2016 14:20     54 year old woman with bronchitis or sinusitis. Treatment with prednisone and Levaquin and already prescribed Atrovent nasal spray. Return as needed.

## 2016-03-16 NOTE — Addendum Note (Signed)
Addended by: Isaias Cowman C on: 03/16/2016 11:55 AM   Modules accepted: Orders

## 2016-03-18 ENCOUNTER — Emergency Department (INDEPENDENT_AMBULATORY_CARE_PROVIDER_SITE_OTHER)
Admission: EM | Admit: 2016-03-18 | Discharge: 2016-03-18 | Disposition: A | Discharge status: Home / Self Care | Payer: Medicare Other | Source: Home / Self Care | Attending: Emergency Medicine | Admitting: Emergency Medicine

## 2016-03-18 ENCOUNTER — Encounter: Payer: Self-pay | Admitting: *Deleted

## 2016-03-18 DIAGNOSIS — J0101 Acute recurrent maxillary sinusitis: Secondary | ICD-10-CM

## 2016-03-18 DIAGNOSIS — K047 Periapical abscess without sinus: Secondary | ICD-10-CM

## 2016-03-18 MED ORDER — CHLORHEXIDINE GLUCONATE 0.12 % MT SOLN: 15.0000 mL | Freq: Two times a day (BID) | OROMUCOSAL | Status: DC

## 2016-03-18 MED ORDER — AMOXICILLIN-POT CLAVULANATE 875-125 MG PO TABS: 1.0000 | ORAL_TABLET | Freq: Two times a day (BID) | ORAL | Status: DC

## 2016-03-18 NOTE — ED Notes (Signed)
Pt was recently seen 03/10/16 and treated for PNA @ PCP and given prednisone. She more recently c/o gum swelling, HA, jaw pain, ear pain and congestion.

## 2016-03-18 NOTE — ED Provider Notes (Signed)
CSN: [id number]     Arrival date & time 03/18/16  1919 History   First MD Initiated Contact with Patient 03/18/16 1942     Chief Complaint  Patient presents with  . Jaw Pain  . Otalgia  . Oral Swelling   P[facility] to [facility] Fri 742 PM.  HPI 1 wk, progressively worsening pain Left maxillary sinus and ear, with congestion and left maxillary dental pain.Moderately severe, sharp. Some gum swelling Left maxillary area.She states was recently treated for "pneumonia" 03/10/16,and she improved on Levaquin. I reviewed her chart which showed normal chest x-ray 03/10/16. She denies any cardiorespiratory symptoms or cough at this time.May have a low grade fever but she's not sure.  Past Medical History  Diagnosis Date  . Urticaria   . Essential hypertension, benign   . Primary osteoarthritis of both knees   . Menstrual migraine    Past Surgical History  Procedure Laterality Date  . Tubal ligation  12/26/1987   Family History  Problem Relation Age of Onset  . Hypertension Mother   . CAD Father     MI in his 31s   Social History  Substance Use Topics  . Smoking status: Former Smoker -- 1.50 packs/day    Types: Cigarettes    Quit date: 02/05/2006  . Smokeless tobacco: Never Used  . Alcohol Use: Yes     Comment: rarely   OB History    Gravida Para Term Preterm AB TAB SAB Ectopic Multiple Living   5 3 3  2 2    3      Review of Systems  All other systems reviewed and are negative.   Allergies  Aspirin; Azithromycin; Morphine and related; Clindamycin/lincomycin; Cortisporin; Erythromycin; Metronidazole; and Nsaids  Home Medications   Prior to Admission medications   Medication Sig Start Date End Date Taking? Authorizing Provider  albuterol (PROVENTIL HFA;VENTOLIN HFA) 108 (90 Base) MCG/ACT inhaler Inhale two puffs every 4-6 hours only as needed for shortness of breath or wheezing. 01/19/16 01/18/17  [name], MD  amoxicillin-clavulanate (AUGMENTIN) 875-125 MG  tablet Take 1 tablet by mouth 2 (two) times daily. For 10 days. Take with food. 03/18/16   [name], MD  cetirizine (ZYRTEC) 1 MG/ML syrup TAKE 10 MLS (10 MG TOTAL) BY MOUTH DAILY. 02/04/16   [name], MD  chlorhexidine (PERIDEX) 0.12 % solution Use as directed 15 mLs in the mouth or throat 2 (two) times daily. 03/18/16   [name], MD  cyclobenzaprine (FLEXERIL) 10 MG tablet Take a half to a full tab every 8-12 hours only as needed for muscle spasm, may cause sedation. 08/08/13   Sean Hommel, DO  ferrous sulfate 325 (65 FE) MG tablet Take 325 mg by mouth daily with breakfast.    Historical Provider, MD  Garlic 123XX123 MG CAPS Take by mouth.    Historical Provider, MD  hydrochlorothiazide (HYDRODIURIL) 25 MG tablet Take 1 tablet (25 mg total) by mouth daily. NEED FOLLOW UP APPOINTMENT FOR MORE REFILLS 02/01/16   Marcial Pacas, DO  hydrOXYzine (ATARAX/VISTARIL) 10 MG tablet Take 10 mg by mouth 3 (three) times daily as needed for itching.     Historical Provider, MD  ipratropium (ATROVENT) 0.06 % nasal spray Place 2 sprays into both nostrils 4 (four) times daily. 12/22/15   [name], MD  montelukast (SINGULAIR) 10 MG tablet Take 10 mg by mouth at bedtime.  04/04/13   Historical Provider, MD  pantoprazole (PROTONIX) 40 MG tablet TAKE 1 TABLET (  40 MG TOTAL) BY MOUTH DAILY. 02/05/16   [name], MD  triamcinolone cream (KENALOG) 0.1 % Apply 1 application topically 2 (two) times daily. Use for 2 to 4 days until improved 02/20/16   Kandra Nicolas, MD   Meds Ordered and Administered this Visit  Medications - No data to display  BP 108/74 mmHg  Pulse 102  Temp(Src) 98.8 F (37.1 C) (Oral)  Wt 191 lb (86.637 kg)  SpO2 96%  LMP 03/07/2016 No data found.   Physical Exam  Constitutional: She is oriented to person, place, and time. She appears well-developed and well-nourished. No distress.  HENT:  Head: Normocephalic and atraumatic.  Right Ear: Tympanic membrane, external ear and ear canal  normal.  Left Ear: Tympanic membrane, external ear and ear canal normal.  Nose: Mucosal edema and rhinorrhea present. Right sinus exhibits no maxillary sinus tenderness. Left sinus exhibits maxillary sinus tenderness.  Mouth/Throat: Oropharynx is clear and moist. No oral lesions. No oropharyngeal exudate.    Eyes: Right eye exhibits no discharge. Left eye exhibits no discharge. No scleral icterus.  Neck: Neck supple.  Cardiovascular: Normal rate, regular rhythm and normal heart sounds.   Pulmonary/Chest: Effort normal and breath sounds normal. She has no wheezes. She has no rales.  Lymphadenopathy:    She has no cervical adenopathy.  Neurological: She is alert and oriented to person, place, and time.  Skin: Skin is warm and dry.  Nursing note and vitals reviewed.   ED Course  Procedures (including critical care time)  Labs Review Labs Reviewed - No data to display  Imaging Review No results found.   Visual Acuity Review  Right Eye Distance:   Left Eye Distance:   Bilateral Distance:    Right Eye Near:   Left Eye Near:    Bilateral Near:         MDM   1. Acute recurrent maxillary sinusitis   2. Dental infection   with gingivitis, L maxillary area.  Treatment options discussed, as well as risks, benefits, alternatives. Reviewed drug allergies to azithromycin and clindamycin. Patient voiced understanding and agreement with the following plans: Discharge Medication List as of 03/18/2016  7:59 PM     chlorhexidine (PERIDEX) 0.12 % solution Use as directed 15 mLs in the mouth or throat 2 (two) times daily.   amoxicillin-clavulanate (AUGMENTIN) 875-125 MG tablet Take 1 tablet by mouth 2 (two) times daily. For 10 days. Take with food.   Follow-up with your dentist and primary care doctor in 5-7 days. Precautions discussed. Red flags discussed. Questions invited and answered. Patient voiced understanding and agreement.        [name], MD 03/21/16  [phone]

## 2016-03-22 LAB — POC HEMOCCULT BLD/STL (HOME/3-CARD/SCREEN)
Card #2 Fecal Occult Blod, POC: NEGATIVE
Card #3 Fecal Occult Blood, POC: NEGATIVE
Fecal Occult Blood, POC: NEGATIVE

## 2016-03-22 NOTE — Addendum Note (Signed)
Addended by: Terance Hart on: 03/22/2016 09:14 AM   Modules accepted: Orders

## 2016-03-23 NOTE — Progress Notes (Signed)
Quick Note:    No blood in the stool  ______

## 2016-03-28 ENCOUNTER — Ambulatory Visit: Payer: Self-pay | Admitting: Family Medicine

## 2016-03-28 ENCOUNTER — Ambulatory Visit (INDEPENDENT_AMBULATORY_CARE_PROVIDER_SITE_OTHER): Payer: Medicare Other | Admitting: Family Medicine

## 2016-03-28 ENCOUNTER — Encounter: Payer: Self-pay | Admitting: Family Medicine

## 2016-03-28 VITALS — BP 130/84 | HR 100 | Temp 98.6°F | Wt 191.0 lb

## 2016-03-28 DIAGNOSIS — M25571 Pain in right ankle and joints of right foot: Secondary | ICD-10-CM

## 2016-03-28 DIAGNOSIS — J309 Allergic rhinitis, unspecified: Secondary | ICD-10-CM

## 2016-03-28 NOTE — Progress Notes (Signed)
Janet Richards is a 55 y.o. female who presents to [facility]: [facility] today for sinusitis and right ankle pain.  1) sinusitis: Patient was seen last week for acute maxillary sinusitis. She was treated with Augmentin. This helped however she had terrible diarrhea which has since resolved but she discontinued the Augmentin. She notes continued nasal congestion and ear pain and pressure. This has happened multiple times in the past. She attributes it to seasonal allergies. She takes Zyrtec and Flonase and Singulair. She has never been evaluated by ear nose and throat doctor.  2) right ankle pain: Patient notes continued right ankle pain. Pain is located in the anterior lateral aspect of the ankle and is worse with ambulation and better with rest. She cannot recall any specific injury. She was evaluated last month with normal x-rays.   Past Medical History  Diagnosis Date  . Urticaria   . Essential hypertension, benign   . Primary osteoarthritis of both knees   . Menstrual migraine    Past Surgical History  Procedure Laterality Date  . Tubal ligation  12/26/1987   Social History  Substance Use Topics  . Smoking status: Former Smoker -- 1.50 packs/day    Types: Cigarettes    Quit date: 02/05/2006  . Smokeless tobacco: Never Used  . Alcohol Use: Yes     Comment: rarely   family history includes CAD in her father; Hypertension in her mother.  ROS as above Medications: Current Outpatient Prescriptions  Medication Sig Dispense Refill  . albuterol (PROVENTIL HFA;VENTOLIN HFA) 108 (90 Base) MCG/ACT inhaler Inhale two puffs every 4-6 hours only as needed for shortness of breath or wheezing. 1 Inhaler 1  . cetirizine (ZYRTEC) 1 MG/ML syrup TAKE 10 MLS (10 MG TOTAL) BY MOUTH DAILY. 944 mL 6  . chlorhexidine (PERIDEX) 0.12 % solution Use as directed 15 mLs in the mouth or throat 2 (two) times  daily. 120 mL 0  . cyclobenzaprine (FLEXERIL) 10 MG tablet Take a half to a full tab every 8-12 hours only as needed for muscle spasm, may cause sedation. 45 tablet 0  . ferrous sulfate 325 (65 FE) MG tablet Take 325 mg by mouth daily with breakfast.    . Garlic 123XX123 MG CAPS Take by mouth.    . hydrochlorothiazide (HYDRODIURIL) 25 MG tablet Take 1 tablet (25 mg total) by mouth daily. NEED FOLLOW UP APPOINTMENT FOR MORE REFILLS 90 tablet 0  . hydrOXYzine (ATARAX/VISTARIL) 10 MG tablet Take 10 mg by mouth 3 (three) times daily as needed for itching.     [name] ipratropium (ATROVENT) 0.06 % nasal spray Place 2 sprays into both nostrils 4 (four) times daily. 15 mL 1  . montelukast (SINGULAIR) 10 MG tablet Take 10 mg by mouth at bedtime.     . pantoprazole (PROTONIX) 40 MG tablet TAKE 1 TABLET (40 MG TOTAL) BY MOUTH DAILY. 90 tablet 1  . triamcinolone cream (KENALOG) 0.1 % Apply 1 application topically 2 (two) times daily. Use for 2 to 4 days until improved 15 g 0   No current facility-administered medications for this visit.   Allergies  Allergen Reactions  . Aspirin Rash    Patient indicates increases Urticaria.   . Azithromycin Nausea And Vomiting  . Morphine And Related Rash    Urticaria   . Augmentin [Amoxicillin-Pot Clavulanate] Diarrhea  . Clindamycin/Lincomycin     Facial swelling  . Cortisporin [Bacitra-Neomycin-Polymyxin-Hc]   . Erythromycin Nausea And Vomiting  Has taken azithromycin with no issues in the past.   . Metronidazole     Rash  . Nsaids Swelling     Exam:  BP 130/84 mmHg  Pulse 100  Temp(Src) 98.6 F (37 C) (Oral)  Wt 191 lb (86.637 kg)  SpO2 98%  LMP 03/07/2016 Gen: Well NAD HEENT: EOMI,  MMM Posterior pharynx with cobblestoning. Clear nasal discharge. Tympanic membranes are partially retracted bilaterally. No erythema of the tympanic membranes visible. Lungs: Normal work of breathing. CTABL Heart: RRR no MRG Abd: NABS, Soft. Nondistended, Nontender Exts:  Brisk capillary refill, warm and well perfused.  Right ankle: Normal-appearing. Tender to palpation anterior lateral ankle. Stable ligamentous exam. Put some pain with talar tilt testing.   No results found for this or any previous visit (from the past 24 hour(s)). No results found.   54 year old woman with  1) sinusitis likely chronic at this time. Refer to ear nose and throat.  2) right ankle pain: Possible ligament strain. Will try physical therapy before MRI. Recheck in one month. Refer to physical therapy.

## 2016-03-28 NOTE — Patient Instructions (Signed)
Thank you for coming in today. Attend PT.  You should hear from the ENT doctors soon.  Return as needed.

## 2016-04-01 ENCOUNTER — Encounter: Payer: Self-pay | Admitting: Rehabilitative and Restorative Service Providers"

## 2016-04-01 ENCOUNTER — Ambulatory Visit (INDEPENDENT_AMBULATORY_CARE_PROVIDER_SITE_OTHER): Payer: Medicare Other | Admitting: Rehabilitative and Restorative Service Providers"

## 2016-04-01 DIAGNOSIS — R29898 Other symptoms and signs involving the musculoskeletal system: Secondary | ICD-10-CM

## 2016-04-01 DIAGNOSIS — R2689 Other abnormalities of gait and mobility: Secondary | ICD-10-CM

## 2016-04-01 DIAGNOSIS — M25571 Pain in right ankle and joints of right foot: Secondary | ICD-10-CM

## 2016-04-01 NOTE — Patient Instructions (Signed)
Ankle Plantar Flexion / Dorsiflexion, Sitting    Sit and gently grasp one foot. Bend foot and ankle up and down. Hold each position _20__ seconds. Repeat _3_ times per session. Do __2_ sessions per day. can use towel to help with stretch    Ankle Inversion / Eversion, Sitting    Sit and grasp bottom of one foot. Bend ankle by moving foot inward and outward. Hold each position _20__ seconds. Repeat __3_ times per session. Do _2__ sessions per day.    Gastroc, Sitting (Passive)    Sit with strap or towel around ball of foot. Gently pull toward body. Hold __20_ seconds.  Repeat __3_ times per session. Do _2__ sessions per day.  HIP: Hamstrings - Supine    Place strap around foot. Raise leg up, keep knee straight. Hold __30_ seconds. __3_ reps per set, __2-3_ sets per day  Dorsiflexion: Resisted    Facing anchor, tubing around left foot, pull toward face.  Repeat __10__ times per set. Do __1__ sets per session. Do __1__ sessions per day.    Plantar Flexion: Resisted    Anchor behind, tubing around left foot, press down. Repeat __10__ times per set. Do __1-2__ sets per session. Do __1__ sessions per day.    Inversion: Resisted    Cross legs with right leg underneath, foot in tubing loop. Hold tubing around other foot to resist and turn foot in. Repeat __10__ times per set. Do _1-2___ sets per session. Do ___2_ sessions per day.    Eversion: Resisted    With right foot in tubing loop, hold tubing around other foot to resist and turn foot out. Repeat _10___ times per set. Do _1-2___ sets per session. Do __1__ sessions per day.    Ankle Pump    With left leg elevated, gently flex and extend ankle. Move through full range of motion. Avoid pain. Repeat ___20-30_ times per set. Do _1-2___ sets per session. Do __2__ sessions per day. Circe and alphabet

## 2016-04-01 NOTE — Therapy (Signed)
[facility] [place], [place], [address] Phone: [phone]   Fax:  [phone]  Physical Therapy Evaluation  Patient Details  Name: Janet Richards MRN: [id number] Date of Birth: 1961/10/21 Referring Provider: Dr. Georgina Snell   Encounter Date: 04/01/2016      PT End of Session - 04/01/16 1252    Visit Number 1   Number of Visits 12   Date for PT Re-Evaluation 05/13/16   PT Start Time 1100   PT Stop Time 1157   PT Time Calculation (min) 57 min   Activity Tolerance Patient tolerated treatment well      Past Medical History  Diagnosis Date  . Urticaria   . Essential hypertension, benign   . Primary osteoarthritis of both knees   . Menstrual migraine     Past Surgical History  Procedure Laterality Date  . Tubal ligation  12/26/1987    There were no vitals filed for this visit.       Subjective Assessment - 04/01/16 1110    Subjective Patient reports that she has been having Rt ankle pain for the past 3-4 weeks. She had some pain with wearing a shoe with laces and had some pain. She has been treating her ankle with ice and ace wrap. Symptoms are improving. She has some continued pain in the upper ankle area.  Patient reports that she only wears heels - seldom flat shoes. Has Rt ankle pain in flat shoes including tennis shoes.    Pertinent History She had severe Rt ankle sprains at 66 and 55 yr old    How long can you sit comfortably? no limit    How long can you stand comfortably? 5-10 min    How long can you walk comfortably? 2-3 min    Diagnostic tests xray    Patient Stated Goals get rid of pain in ankle    Currently in Pain? Yes   Pain Score 3    Pain Location Ankle   Pain Orientation Right   Pain Descriptors / Indicators Aching  hurt    Pain Type Acute pain   Pain Onset More than a month ago   Pain Frequency Constant   Aggravating Factors  walking; running; jumping; stairs    Pain Relieving  Factors ice ace wrap; meds            [facility] PT Assessment - 04/01/16 0001    Assessment   Medical Diagnosis Rt ankle pain    Referring Provider Dr. Georgina Snell    Onset Date/Surgical Date 03/07/16   Hand Dominance Right   Next MD Visit no return    Prior Therapy none   Precautions   Precautions None   Balance Screen   Has the patient fallen in the past 6 months No   Has the patient had a decrease in activity level because of a fear of falling?  No   Is the patient reluctant to leave their home because of a fear of falling?  No   Home Environment   Additional Comments multilevel home some difficulty with stairs    Prior Function   Level of Independence Independent   Vocation On disability  2010   Leisure household chores; church work; shopping; gym 2 days/wk - treadmill/walking/machines 2 hours    Observation/Other Assessments   Focus on Therapeutic Outcomes (FOTO)  34% limitation    Observation/Other Assessments-Edema    Edema --  minimal    Sensation   Additional  Comments WFL's per pt report    Posture/Postural Control   Posture Comments head forward; shoudlers rounded; knees hyperextended; feet/LE's in ER    AROM   Overall AROM Comments WFL's except as noted    Right Ankle Dorsiflexion 0   Right Ankle Plantar Flexion 48   Right Ankle Inversion 28   Right Ankle Eversion 16   Strength   Overall Strength Comments WNL's bilat LE's except as noted    Right/Left Ankle --  Lt ankle 5/5   Right Ankle Dorsiflexion 4+/5   Right Ankle Plantar Flexion 3/5   Right Ankle Inversion 4+/5   Right Ankle Eversion 4/5   Palpation   Palpation comment tender to palpation through the Rt medial and lateral malleolus and talus globally   tight through the talus/talocrual joint    Ambulation/Gait   Gait Comments limp Rt LE with feet in ER                    [facility] Adult PT Treatment/Exercise - 04/01/16 0001    Electrical Stimulation   Electrical Stimulation Location Rt ankle     Electrical Stimulation Action IFC   Electrical Stimulation Parameters to tolerance    Electrical Stimulation Goals Pain   Vasopneumatic   Number Minutes Vasopneumatic  15 minutes   Vasopnuematic Location  Ankle  Rt   Vasopneumatic Pressure Low   Vasopneumatic Temperature  3*   Ankle Exercises: Stretches   Gastroc Stretch 3 reps;20 seconds  seated with towel    Other Stretch ankle inversioin and eversion stretch with towel seated 3 reps ea 20 sec    Ankle Exercises: Supine   T-Band PF/DF/INV/EVER with red TB x 5 reps                 PT Education - 04/01/16 1138    Education provided Yes   Education Details HEP    Person(s) Educated Patient   Methods Explanation;Demonstration;Tactile cues;Verbal cues;Handout   Comprehension Verbalized understanding;Returned demonstration;Verbal cues required;Tactile cues required             PT Long Term Goals - 04/01/16 1257    PT LONG TERM GOAL #1   Title Increase Rt ankle ROM by 5-10 degrees in all planes 05/13/16   Time 6   Period Weeks   Status New   PT LONG TERM GOAL #2   Title 4+/5 to 5/5 strength in Rt ankle 05/13/16   Time 6   Period Weeks   Status New   PT LONG TERM GOAL #3   Title Improve gait pattern with no limp Rt LE 05/13/16   Time 6   Period Weeks   Status New   PT LONG TERM GOAL #4   Title Independent in HEP 05/13/16   Time 6   Period Weeks   Status New   PT LONG TERM GOAL #5   Title Improve FOTO to</= 27% limitation 05/13/16   Time 6   Period Weeks   Status New               Plan - 04/01/16 1253    Clinical Impression Statement Patient presents with Rt ankle pain which has been present for ~ 3 weeks with no known injury. Symptoms are improving. Patient has a history of severe Rt ankle sprain x 2 at 27 and 55 yrs old. She reports that she wears heels "all the time' and has Rt ankle pain when she is in flat shoes. Patient has limited  strength and ROM in the Rt ankle; abnormal gait pattern; and pain  limiting functional activity.    Rehab Potential Good   PT Frequency 2x / week   PT Duration 6 weeks   PT Treatment/Interventions Patient/family education;ADLs/Self Care Home Management;Balance training;Cryotherapy;Electrical Stimulation;Iontophoresis 4mg /ml Dexamethasone;Moist Heat;Traction;Ultrasound;Neuromuscular re-education;Manual techniques;Dry needling;Therapeutic activities;Therapeutic exercise   PT Next Visit Plan progress with ankle stretches as tolerated - gastroc and soleus in standing; joint mobilization Rt ankle; progress with Rt ankle strengthening as tolerated; modalities as indicated    Consulted and Agree with Plan of Care Patient      Patient will benefit from skilled therapeutic intervention in order to improve the following deficits and impairments:  Postural dysfunction, Improper body mechanics, Pain, Decreased range of motion, Decreased mobility, Decreased strength, Decreased endurance, Decreased activity tolerance, Abnormal gait  Visit Diagnosis: Pain in right ankle and joints of right foot - Plan: PT plan of care cert/re-cert  Other symptoms and signs involving the musculoskeletal system - Plan: PT plan of care cert/re-cert  Other abnormalities of gait and mobility - Plan: PT plan of care cert/re-cert     Problem List Patient Active Problem List   Diagnosis Date Noted  . Anemia, iron deficiency 02/25/2016  . Right ankle pain 02/24/2016  . Fatigue 02/24/2016  . Paresthesia 07/01/2015  . History of colonic polyps 03/20/2015  . Hyperlipidemia 02/20/2015  . Chronic allergic rhinitis 01/14/2015  . IFG (impaired fasting glucose) 10/17/2014  . Osteoarthritis of first metatarsophalangeal joint 04/11/2014  . 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 No blood products 03/28/2013  . Essential hypertension, benign 03/01/2013  . Exertional chest pain 02/05/2013  . Abnormal uterine bleeding 02/05/2013  . Primary osteoarthritis of both knees 12/27/2012    Gerome Kokesh Nilda Simmer  PT, MPH  04/01/2016, 1:02 PM  [facility] [place], [place], [address] Phone: [phone]   Fax:  [phone]  Name: Janet Richards MRN: [id number] Date of Birth: [birth year]

## 2016-04-05 ENCOUNTER — Telehealth: Payer: Self-pay

## 2016-04-05 ENCOUNTER — Other Ambulatory Visit: Payer: Self-pay | Admitting: Family Medicine

## 2016-04-05 DIAGNOSIS — Z885 Allergy status to narcotic agent status: Secondary | ICD-10-CM | POA: Diagnosis not present

## 2016-04-05 DIAGNOSIS — I10 Essential (primary) hypertension: Secondary | ICD-10-CM | POA: Diagnosis not present

## 2016-04-05 DIAGNOSIS — J302 Other seasonal allergic rhinitis: Secondary | ICD-10-CM | POA: Diagnosis not present

## 2016-04-05 DIAGNOSIS — Z87891 Personal history of nicotine dependence: Secondary | ICD-10-CM | POA: Diagnosis not present

## 2016-04-05 DIAGNOSIS — Z79899 Other long term (current) drug therapy: Secondary | ICD-10-CM | POA: Diagnosis not present

## 2016-04-05 DIAGNOSIS — E119 Type 2 diabetes mellitus without complications: Secondary | ICD-10-CM | POA: Diagnosis not present

## 2016-04-05 DIAGNOSIS — Z883 Allergy status to other anti-infective agents status: Secondary | ICD-10-CM | POA: Diagnosis not present

## 2016-04-05 DIAGNOSIS — Z881 Allergy status to other antibiotic agents status: Secondary | ICD-10-CM | POA: Diagnosis not present

## 2016-04-05 DIAGNOSIS — R0789 Other chest pain: Secondary | ICD-10-CM | POA: Diagnosis not present

## 2016-04-05 DIAGNOSIS — Z7952 Long term (current) use of systemic steroids: Secondary | ICD-10-CM | POA: Diagnosis not present

## 2016-04-05 DIAGNOSIS — Z880 Allergy status to penicillin: Secondary | ICD-10-CM | POA: Diagnosis not present

## 2016-04-05 DIAGNOSIS — Z886 Allergy status to analgesic agent status: Secondary | ICD-10-CM | POA: Diagnosis not present

## 2016-04-05 DIAGNOSIS — R079 Chest pain, unspecified: Secondary | ICD-10-CM | POA: Diagnosis not present

## 2016-04-05 MED ORDER — CIPROFLOXACIN HCL 500 MG PO TABS: 500.0000 mg | ORAL_TABLET | Freq: Two times a day (BID) | ORAL | Status: DC

## 2016-04-05 NOTE — Telephone Encounter (Signed)
Pt called stating that she would like an abx for a sinus infection. She states that her ears feel like they have stabbing in them, she has a headache at her forehead, and the bridge of her nose hurts. She says that you just saw her this and it is a sinus infection not related to her teeth and she needs abx. Please advise.

## 2016-04-05 NOTE — Telephone Encounter (Signed)
Pt notified. Gave pt number to [facility] advising her to give them a call to schedule an appointment.

## 2016-04-05 NOTE — Telephone Encounter (Signed)
[facility] sent in.  Pt need to f/u with ENT

## 2016-04-07 ENCOUNTER — Encounter: Admitting: Rehabilitative and Restorative Service Providers"

## 2016-04-08 ENCOUNTER — Other Ambulatory Visit: Payer: Self-pay | Admitting: Family Medicine

## 2016-04-11 ENCOUNTER — Encounter: Payer: Self-pay | Admitting: Rehabilitative and Restorative Service Providers"

## 2016-04-11 ENCOUNTER — Encounter: Payer: Self-pay | Admitting: Family Medicine

## 2016-04-11 ENCOUNTER — Ambulatory Visit (INDEPENDENT_AMBULATORY_CARE_PROVIDER_SITE_OTHER): Payer: Medicare Other | Admitting: Rehabilitative and Restorative Service Providers"

## 2016-04-11 ENCOUNTER — Ambulatory Visit (INDEPENDENT_AMBULATORY_CARE_PROVIDER_SITE_OTHER): Payer: Medicare Other | Admitting: Family Medicine

## 2016-04-11 VITALS — BP 144/72 | HR 101 | Temp 98.6°F | Wt 189.0 lb

## 2016-04-11 DIAGNOSIS — M25571 Pain in right ankle and joints of right foot: Secondary | ICD-10-CM

## 2016-04-11 DIAGNOSIS — R2689 Other abnormalities of gait and mobility: Secondary | ICD-10-CM | POA: Diagnosis not present

## 2016-04-11 DIAGNOSIS — K089 Disorder of teeth and supporting structures, unspecified: Secondary | ICD-10-CM | POA: Insufficient documentation

## 2016-04-11 DIAGNOSIS — R29898 Other symptoms and signs involving the musculoskeletal system: Secondary | ICD-10-CM

## 2016-04-11 DIAGNOSIS — K047 Periapical abscess without sinus: Secondary | ICD-10-CM

## 2016-04-11 DIAGNOSIS — K0530 Chronic periodontitis, unspecified: Secondary | ICD-10-CM | POA: Diagnosis not present

## 2016-04-11 MED ORDER — AMOXICILLIN 500 MG PO CAPS: 500.0000 mg | ORAL_CAPSULE | Freq: Three times a day (TID) | ORAL | Status: DC

## 2016-04-11 NOTE — Therapy (Signed)
[facility] [place], [place], [address] Phone: [phone]   Fax:  [phone]  Physical Therapy Treatment  Patient Details  Name: Janet Richards MRN: [id number] Date of Birth: 11-19-1960 Referring Provider: Dr. Georgina Snell   Encounter Date: 04/11/2016      PT End of Session - 04/11/16 1155    Visit Number 2   Number of Visits 12   Date for PT Re-Evaluation 05/13/16   PT Start Time 1149   PT Stop Time 1242   PT Time Calculation (min) 53 min   Activity Tolerance Patient tolerated treatment well      Past Medical History  Diagnosis Date  . Urticaria   . Essential hypertension, benign   . Primary osteoarthritis of both knees   . Menstrual migraine     Past Surgical History  Procedure Laterality Date  . Tubal ligation  12/26/1987    There were no vitals filed for this visit.      Subjective Assessment - 04/11/16 1155    Subjective Exercises were OK. Ankle is "good and bad". Some pain continues.    Currently in Pain? No/denies   Pain Score 0-No pain                         [facility] Adult PT Treatment/Exercise - 04/11/16 0001    Electrical Stimulation   Electrical Stimulation Location Rt ankle    Electrical Stimulation Action IFC   Electrical Stimulation Parameters to tolerance   Electrical Stimulation Goals Pain   Vasopneumatic   Number Minutes Vasopneumatic  15 minutes   Vasopnuematic Location  Ankle  Rt   Vasopneumatic Pressure Low   Vasopneumatic Temperature  3*   Manual Therapy   Joint Mobilization Rt ankle talocrual and calcaneous   Ankle Exercises: Aerobic   Stationary Bike Nustep L4 x 5 min   started at L5 knees hurt adjusted to L4 OK    Ankle Exercises: Stretches   Soleus Stretch 3 reps;30 seconds   Gastroc Stretch 3 reps;20 seconds  seated with towel    Other Stretch ankle inversion and eversion stretch with towel seated 3 reps ea 20 sec    Ankle Exercises: Supine    T-Band PF/DF/INV/EVER with red TB x 20 reps    Ankle Exercises: Standing   SLS 20 sec x 5 with hand touch assist    Heel Raises 20 reps   Other Standing Ankle Exercises step up x 20 Rt forward and x20 lateral                 PT Education - 04/11/16 1213    Education provided Yes   Education Details HEP    Person(s) Educated Patient   Methods Explanation;Demonstration;Tactile cues;Verbal cues;Handout   Comprehension Verbalized understanding;Returned demonstration;Verbal cues required;Tactile cues required             PT Long Term Goals - 04/11/16 1157    PT LONG TERM GOAL #1   Title Increase Rt ankle ROM by 5-10 degrees in all planes 05/13/16   Time 6   Period Weeks   Status On-going   PT LONG TERM GOAL #2   Title 4+/5 to 5/5 strength in Rt ankle 05/13/16   Time 6   Period Weeks   Status On-going   PT LONG TERM GOAL #3   Title Improve gait pattern with no limp Rt LE 05/13/16   Time 6   Period Weeks  Status On-going   PT LONG TERM GOAL #4   Title Independent in HEP 05/13/16   Time 6   Period Weeks   Status On-going   PT LONG TERM GOAL #5   Title Improve FOTO to</= 27% limitation 05/13/16   Time 6   Period Weeks   Status On-going               Plan - 04/11/16 1229    Clinical Impression Statement Patient reports that her ankle feels better today. She has less pain. She is working on [employer]. Progressed well with additional ROM and strengthening exercises. No goals of therapy accomplished - only second visit.    Rehab Potential Good   PT Frequency 2x / week   PT Duration 6 weeks   PT Treatment/Interventions Patient/family education;ADLs/Self Care Home Management;Balance training;Cryotherapy;Electrical Stimulation;Iontophoresis 4mg /ml Dexamethasone;Moist Heat;Traction;Ultrasound;Neuromuscular re-education;Manual techniques;Dry needling;Therapeutic activities;Therapeutic exercise   PT Next Visit Plan progress with ankle stretches as tolerated -; joint  mobilization Rt ankle; progress with Rt ankle strengthening as tolerated; modalities as indicated    Consulted and Agree with Plan of Care Patient      Patient will benefit from skilled therapeutic intervention in order to improve the following deficits and impairments:  Postural dysfunction, Improper body mechanics, Pain, Decreased range of motion, Decreased mobility, Decreased strength, Decreased endurance, Decreased activity tolerance, Abnormal gait  Visit Diagnosis: Pain in right ankle and joints of right foot  Other symptoms and signs involving the musculoskeletal system  Other abnormalities of gait and mobility     Problem List Patient Active Problem List   Diagnosis Date Noted  . Anemia, iron deficiency 02/25/2016  . Right ankle pain 02/24/2016  . Fatigue 02/24/2016  . Paresthesia 07/01/2015  . History of colonic polyps 03/20/2015  . Hyperlipidemia 02/20/2015  . Chronic allergic rhinitis 01/14/2015  . IFG (impaired fasting glucose) 10/17/2014  . Osteoarthritis of first metatarsophalangeal joint 04/11/2014  . 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 No blood products 03/28/2013  . Essential hypertension, benign 03/01/2013  . Exertional chest pain 02/05/2013  . Abnormal uterine bleeding 02/05/2013  . Primary osteoarthritis of both knees 12/27/2012    Owen Pratte Nilda Simmer PT, MPH  04/11/2016, 12:32 PM  [facility] [place], [place], [address] Phone: [phone]   Fax:  [phone]  Name: TEKEISHA GREWAL MRN: [id number] Date of Birth: 17-Aug-1961

## 2016-04-11 NOTE — Patient Instructions (Signed)
Achilles / Soleus, Standing    Stand, right foot behind, heel on floor and turned slightly out. Lower hips and bend knees. Hold _30_ seconds. Repeat __3_ times per session. Do _2-3__ sessions per day.    Balance: Unilateral    Attempt to balance on left leg, eyes open. Hold __20-30 __ seconds. Repeat __5__ times per set. Do _1-2___ sets per session. Do __2-3__ sessions per day. Perform exercise with eyes closed.   Heel Raise (Calf Strength / Balance)    Stand with support, __0_ lb weights on ankles. Breathe in. Rise up on tiptoes, breathing out through pursed lips. Hold position to count of _3-5__. Return slowly, breathing in. Repeat __20_ times per session. Do_2__ sessions per day. Variation: Do without weights.   Forward    Facing step, place one leg on step, flexed at hip. Step up slowly, bringing hips in line with knee and shoulder. Bring other foot onto step. Reverse process to step back down. Repeat with other leg. Do __20__ repetitions, _1-3___ sets.   Lateral Step Up    Stand to side of step. Step up leading with left leg. Slowly step down leading with opposite leg. Perform _20__ reps. 2-3 reps

## 2016-04-11 NOTE — Patient Instructions (Signed)
Thank you for coming in today. Return in 1 month or sooner if needed.  Get your teeth pulled.

## 2016-04-11 NOTE — Progress Notes (Signed)
Janet Richards is a 55 y.o. female who presents to [place] today for dental pain. Patient has chronic dental infections that have been ongoing for months to years. She notes that she has an appointment upcoming for her teeth to be extracted and has worsening upper left dental pain. She notes the ciprofloxacin that typically helps has not helped any more. She has had good benefit with plain amoxicillin in the past and would like to try that again.   Past Medical History  Diagnosis Date  . Urticaria   . Essential hypertension, benign   . Primary osteoarthritis of both knees   . Menstrual migraine    Past Surgical History  Procedure Laterality Date  . Tubal ligation  12/26/1987   Social History  Substance Use Topics  . Smoking status: Former Smoker -- 1.50 packs/day    Types: Cigarettes    Quit date: 02/05/2006  . Smokeless tobacco: Never Used  . Alcohol Use: Yes     Comment: rarely   family history includes CAD in her father; Hypertension in her mother.  ROS as above:  Medications: Current Outpatient Prescriptions  Medication Sig Dispense Refill  . albuterol (PROVENTIL HFA;VENTOLIN HFA) 108 (90 Base) MCG/ACT inhaler Inhale two puffs every 4-6 hours only as needed for shortness of breath or wheezing. 1 Inhaler 1  . amoxicillin (AMOXIL) 500 MG capsule Take 1 capsule (500 mg total) by mouth 3 (three) times daily. 30 capsule 0  . cetirizine (ZYRTEC) 1 MG/ML syrup TAKE 10 MLS (10 MG TOTAL) BY MOUTH DAILY. 944 mL 6  . chlorhexidine (PERIDEX) 0.12 % solution Use as directed 15 mLs in the mouth or throat 2 (two) times daily. 120 mL 0  . cyclobenzaprine (FLEXERIL) 10 MG tablet Take a half to a full tab every 8-12 hours only as needed for muscle spasm, may cause sedation. 45 tablet 0  . ferrous sulfate 325 (65 FE) MG tablet Take 325 mg by mouth daily with  breakfast.    . Garlic 123XX123 MG CAPS Take by mouth.    . hydrochlorothiazide (HYDRODIURIL) 25 MG tablet Take 1 tablet (25 mg total) by mouth daily. NEED FOLLOW UP APPOINTMENT FOR MORE REFILLS 90 tablet 0  . hydrochlorothiazide (HYDRODIURIL) 25 MG tablet TAKE 1 TABLET (25 MG TOTAL) BY MOUTH DAILY. 90 tablet 0  . hydrochlorothiazide (HYDRODIURIL) 25 MG tablet TAKE 1 TABLET (25 MG TOTAL) BY MOUTH DAILY. 90 tablet 2  . hydrOXYzine (ATARAX/VISTARIL) 10 MG tablet Take 10 mg by mouth 3 (three) times daily as needed for itching.     [name] ipratropium (ATROVENT) 0.06 % nasal spray Place 2 sprays into both nostrils 4 (four) times daily. 15 mL 1  . montelukast (SINGULAIR) 10 MG tablet Take 10 mg by mouth at bedtime.     . pantoprazole (PROTONIX) 40 MG tablet TAKE 1 TABLET (40 MG TOTAL) BY MOUTH DAILY. 90 tablet 1  . triamcinolone cream (KENALOG) 0.1 % Apply 1 application topically 2 (two) times daily. Use for 2 to 4 days until improved 15 g 0   No current facility-administered medications for this visit.   Allergies  Allergen Reactions  . Aspirin Rash    Patient indicates increases Urticaria.   . Azithromycin Nausea And Vomiting  . Morphine And Related Rash    Urticaria   . Augmentin [Amoxicillin-Pot Clavulanate] Diarrhea  . Clindamycin/Lincomycin     Facial swelling  . Cortisporin [Bacitra-Neomycin-Polymyxin-Hc]   .  Erythromycin Nausea And Vomiting    Has taken azithromycin with no issues in the past.   . Metronidazole     Rash  . Nsaids Swelling     Exam:  BP 144/72 mmHg  Pulse 101  Temp(Src) 98.6 F (37 C)  Wt 189 lb (85.73 kg)  SpO2 99%  LMP 03/07/2016 Gen: Well NAD HEENT: EOMI,  MMM Poor dentition with inflamed gingiva upper left   No results found for this or any previous visit (from the past 24 hour(s)). No results found.    Assessment and Plan: 55 y.o. female with chronic dental infection. Discussed with patient the importance of dental extraction as antibiotics are only a  temporary measure. Use amoxicillin briefly. Follow-up soon.  Discussed warning signs or symptoms. Please see discharge instructions. Patient expresses understanding.

## 2016-04-13 ENCOUNTER — Encounter: Payer: Medicare Other | Admitting: Physical Therapy

## 2016-04-15 ENCOUNTER — Ambulatory Visit (INDEPENDENT_AMBULATORY_CARE_PROVIDER_SITE_OTHER): Payer: Medicare Other | Admitting: Rehabilitative and Restorative Service Providers"

## 2016-04-15 ENCOUNTER — Encounter: Payer: Self-pay | Admitting: Rehabilitative and Restorative Service Providers"

## 2016-04-15 DIAGNOSIS — R29898 Other symptoms and signs involving the musculoskeletal system: Secondary | ICD-10-CM

## 2016-04-15 DIAGNOSIS — R2689 Other abnormalities of gait and mobility: Secondary | ICD-10-CM

## 2016-04-15 DIAGNOSIS — M25571 Pain in right ankle and joints of right foot: Secondary | ICD-10-CM | POA: Diagnosis not present

## 2016-04-15 NOTE — Therapy (Signed)
[place], [place], [address] Phone: [phone]   Fax:  [phone]  Physical Therapy Treatment  Patient Details  Name: Janet Richards MRN: [id number] Date of Birth: 08/11/1961 Referring Provider: Dr. Georgina Snell   Encounter Date: 04/15/2016      PT End of Session - 04/15/16 1456    Visit Number 3   Number of Visits 12   Date for PT Re-Evaluation 05/13/16   PT Start Time 1454  pt 8 min late for appt    PT Stop Time 1542   PT Time Calculation (min) 48 min   Activity Tolerance Patient tolerated treatment well      Past Medical History  Diagnosis Date  . Urticaria   . Essential hypertension, benign   . Primary osteoarthritis of both knees   . Menstrual migraine     Past Surgical History  Procedure Laterality Date  . Tubal ligation  12/26/1987    There were no vitals filed for this visit.      Subjective Assessment - 04/15/16 1458    Subjective Ankle just hurts when she is in flats or tennis shoes - just to wearing heels and wedges which she has worn for years  Notes that she is limping in her tennis shoes    Currently in Pain? No/denies                         [facility] Adult PT Treatment/Exercise - 04/15/16 0001    Electrical Stimulation   Electrical Stimulation Location Rt ankle    Electrical Stimulation Action IFC    Electrical Stimulation Parameters to tolerance    Electrical Stimulation Goals Pain   Vasopneumatic   Number Minutes Vasopneumatic  15 minutes   Vasopnuematic Location  Ankle  Rt   Vasopneumatic Pressure Low   Vasopneumatic Temperature  3*   Ankle Exercises: Aerobic   Stationary Bike Nustep L5 x 5 min    Ankle Exercises: Stretches   Soleus Stretch 3 reps;30 seconds   Gastroc Stretch 3 reps;20 seconds  seated with towel    Ankle Exercises: Standing   Vector Stance --  SLS touch chest and waist height x 20 - 2 sets    SLS 30 sec x 5 blue cushion  with hand touch assist as needed for balance   Heel Raises 20 reps  some pain Rt ankle    Side Shuffle (Round Trip) side steps toward either side 20 ft x 4   Braiding (Round Trip) 20 feet x 6 VC for foot placement   Other Standing Ankle Exercises step up x 30 Rt forward and x30 lateral    Ankle Exercises: Supine   T-Band PF/DF/INV/EVER with red TB x 20 reps                      PT Long Term Goals - 04/11/16 1157    PT LONG TERM GOAL #1   Title Increase Rt ankle ROM by 5-10 degrees in all planes 05/13/16   Time 6   Period Weeks   Status On-going   PT LONG TERM GOAL #2   Title 4+/5 to 5/5 strength in Rt ankle 05/13/16   Time 6   Period Weeks   Status On-going   PT LONG TERM GOAL #3   Title Improve gait pattern with no limp Rt LE 05/13/16   Time 6   Period Weeks   Status On-going  PT LONG TERM GOAL #4   Title Independent in HEP 05/13/16   Time 6   Period Weeks   Status On-going   PT LONG TERM GOAL #5   Title Improve FOTO to</= 27% limitation 05/13/16   Time 6   Period Weeks   Status On-going               Plan - 04/15/16 1536    Clinical Impression Statement Improvement in ankle pain per pt report. She has pain with some strengthening exercises and decreased coordination with SLS and functional activities. Patient is progressing well toward stated goals of therapy.    Rehab Potential Good   PT Frequency 2x / week   PT Duration 6 weeks   PT Treatment/Interventions Patient/family education;ADLs/Self Care Home Management;Balance training;Cryotherapy;Electrical Stimulation;Iontophoresis 4mg /ml Dexamethasone;Moist Heat;Traction;Ultrasound;Neuromuscular re-education;Manual techniques;Dry needling;Therapeutic activities;Therapeutic exercise   PT Next Visit Plan progress with ankle stretches as tolerated -; joint mobilization Rt ankle; progress with Rt ankle strengthening as tolerated; modalities as indicated    Consulted and Agree with Plan of Care Patient       Patient will benefit from skilled therapeutic intervention in order to improve the following deficits and impairments:  Postural dysfunction, Improper body mechanics, Pain, Decreased range of motion, Decreased mobility, Decreased strength, Decreased endurance, Decreased activity tolerance, Abnormal gait  Visit Diagnosis: Pain in right ankle and joints of right foot  Other symptoms and signs involving the musculoskeletal system  Other abnormalities of gait and mobility     Problem List Patient Active Problem List   Diagnosis Date Noted  . Chronic dental infection 04/11/2016  . Anemia, iron deficiency 02/25/2016  . Right ankle pain 02/24/2016  . Fatigue 02/24/2016  . Paresthesia 07/01/2015  . History of colonic polyps 03/20/2015  . Hyperlipidemia 02/20/2015  . Chronic allergic rhinitis 01/14/2015  . IFG (impaired fasting glucose) 10/17/2014  . Osteoarthritis of first metatarsophalangeal joint 04/11/2014  . 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 No blood products 03/28/2013  . Essential hypertension, benign 03/01/2013  . Exertional chest pain 02/05/2013  . Abnormal uterine bleeding 02/05/2013  . Primary osteoarthritis of both knees 12/27/2012    Zephaniah Enyeart Nilda Simmer PT, MPH  04/15/2016, 3:39 PM  [facility] [place], [place], [address] Phone: [phone]   Fax:  [phone]  Name: NISA GRIDER MRN: [id number] Date of Birth: Apr 11, 1961

## 2016-04-18 ENCOUNTER — Telehealth: Payer: Self-pay | Admitting: Family Medicine

## 2016-04-18 NOTE — Telephone Encounter (Signed)
Patient walked-in and wanted to say Thank you Dr. Georgina Snell she got one tooth pulled that was giving me the infection.

## 2016-04-19 ENCOUNTER — Ambulatory Visit (INDEPENDENT_AMBULATORY_CARE_PROVIDER_SITE_OTHER): Payer: Medicare Other | Admitting: Rehabilitative and Restorative Service Providers"

## 2016-04-19 ENCOUNTER — Encounter: Payer: Self-pay | Admitting: Rehabilitative and Restorative Service Providers"

## 2016-04-19 DIAGNOSIS — M25571 Pain in right ankle and joints of right foot: Secondary | ICD-10-CM

## 2016-04-19 DIAGNOSIS — R2689 Other abnormalities of gait and mobility: Secondary | ICD-10-CM

## 2016-04-19 DIAGNOSIS — R29898 Other symptoms and signs involving the musculoskeletal system: Secondary | ICD-10-CM

## 2016-04-19 NOTE — Therapy (Signed)
[facility] [place], [place], [address] Phone: [phone]   Fax:  [phone]  Physical Therapy Treatment  Patient Details  Name: Janet Richards MRN: [id number] Date of Birth: 06-10-1961 Referring Provider: Dr. Georgina Snell  Encounter Date: 04/19/2016      PT End of Session - 04/19/16 1201    Visit Number 4   Number of Visits 12   Date for PT Re-Evaluation 05/13/16   PT Start Time 1155  10 min late for appt 1248   PT Stop Time 1242   PT Time Calculation (min) 47 min   Activity Tolerance Patient tolerated treatment well      Past Medical History  Diagnosis Date  . Urticaria   . Essential hypertension, benign   . Primary osteoarthritis of both knees   . Menstrual migraine     Past Surgical History  Procedure Laterality Date  . Tubal ligation  12/26/1987    There were no vitals filed for this visit.      Subjective Assessment - 04/19/16 1200    Subjective Ankle feels a little better. Her tooth and face hurts some for tooth extraction yesterday. She has not really had a chance to do much with the exercises in the past few days.    Currently in Pain? No/denies            [facility] PT Assessment - 04/19/16 0001    Assessment   Medical Diagnosis Rt ankle pain    Referring Provider Dr. Georgina Snell   Onset Date/Surgical Date 03/07/16   Hand Dominance Right   Next MD Visit no return    Prior Therapy none   AROM   Right Ankle Dorsiflexion 4   Right Ankle Plantar Flexion 49   Strength   Right Ankle Dorsiflexion --  5-/5   Right Ankle Plantar Flexion 4/5   Right Ankle Inversion 5/5   Right Ankle Eversion --  5-/5   Palpation   Palpation comment improving tender to palpation through the Rt medial and lateral malleolus and talus globally   tight through the talus/talocrual joint                      [facility] Adult PT Treatment/Exercise - 04/19/16 0001    Electrical Stimulation   Electrical Stimulation Location Rt ankle    Electrical Stimulation Action IFC   Electrical Stimulation Parameters to tolerance   Electrical Stimulation Goals Pain   Vasopneumatic   Number Minutes Vasopneumatic  15 minutes   Vasopnuematic Location  Ankle  Rt   Vasopneumatic Pressure Low   Vasopneumatic Temperature  3*   Ankle Exercises: Aerobic   Stationary Bike Nustep L7 UE's 9  x 5 min    Ankle Exercises: Stretches   Soleus Stretch 3 reps;30 seconds   Gastroc Stretch 3 reps;20 seconds  seated with towel    Ankle Exercises: Standing   Vector Stance --  SLS touch waist to knee height x 20 - 2 sets    SLS 30 sec x 5 gray cushion with hand touch assist as needed for balance   Heel Raises 20 reps  some pain Rt ankle    Side Shuffle (Round Trip) side steps toward either side 20 ft x 4   Braiding (Round Trip) 20 feet x 6 VC for foot placement   Other Standing Ankle Exercises step up x 30 Rt forward and x30 lateral    Other Standing Ankle Exercises SLS on bous  ball inverted HHA hip abd and hip ext 10 reps x 2 sets    Ankle Exercises: Supine   T-Band issued green TB for home                 PT Education - 04/19/16 1235    Education provided Yes   Education Details encouraged continued exercise at home    Person(s) Educated Patient   Methods Explanation   Comprehension Verbalized understanding             PT Long Term Goals - 04/19/16 1237    PT LONG TERM GOAL #1   Title Increase Rt ankle ROM by 5-10 degrees in all planes 05/13/16   Time 6   Period Weeks   Status On-going   PT LONG TERM GOAL #2   Title 4+/5 to 5/5 strength in Rt ankle 05/13/16   Time 6   Period Weeks   Status On-going   PT LONG TERM GOAL #3   Title Improve gait pattern with no limp Rt LE 05/13/16   Time 6   Period Weeks   Status On-going   PT LONG TERM GOAL #4   Title Independent in HEP 05/13/16   Time 6   Period Weeks   Status On-going   PT LONG TERM GOAL #5   Title Improve FOTO to</= 27%  limitation 05/13/16   Time 6   Period Weeks   Status On-going               Plan - 04/19/16 1236    Clinical Impression Statement Patient continues to progress. She has improved moblity and strength Rt ankle with less discomfort with palpation. Progressing well toward stated goals of therapy.    Rehab Potential Good   PT Frequency 2x / week   PT Duration 6 weeks   PT Treatment/Interventions Patient/family education;ADLs/Self Care Home Management;Balance training;Cryotherapy;Electrical Stimulation;Iontophoresis 4mg /ml Dexamethasone;Moist Heat;Traction;Ultrasound;Neuromuscular re-education;Manual techniques;Dry needling;Therapeutic activities;Therapeutic exercise   PT Next Visit Plan progress with ankle stretches as tolerated -; joint mobilization Rt ankle; progress with Rt ankle strengthening as tolerated; modalities as indicated    Consulted and Agree with Plan of Care Patient      Patient will benefit from skilled therapeutic intervention in order to improve the following deficits and impairments:  Postural dysfunction, Improper body mechanics, Pain, Decreased range of motion, Decreased mobility, Decreased strength, Decreased endurance, Decreased activity tolerance, Abnormal gait  Visit Diagnosis: Pain in right ankle and joints of right foot  Other symptoms and signs involving the musculoskeletal system  Other abnormalities of gait and mobility     Problem List Patient Active Problem List   Diagnosis Date Noted  . Chronic dental infection 04/11/2016  . Anemia, iron deficiency 02/25/2016  . Right ankle pain 02/24/2016  . Fatigue 02/24/2016  . Paresthesia 07/01/2015  . History of colonic polyps 03/20/2015  . Hyperlipidemia 02/20/2015  . Chronic allergic rhinitis 01/14/2015  . IFG (impaired fasting glucose) 10/17/2014  . Osteoarthritis of first metatarsophalangeal joint 04/11/2014  . 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 No blood products 03/28/2013  . Essential  hypertension, benign 03/01/2013  . Exertional chest pain 02/05/2013  . Abnormal uterine bleeding 02/05/2013  . Primary osteoarthritis of both knees 12/27/2012    Keyaira Clapham Nilda Simmer PT, MPH  04/19/2016, 12:38 PM  [place] [facility] [place], [place], [address] Phone: [phone]   Fax:  [phone]  Name: Janet Richards MRN: [id number] Date of Birth: 09-Dec-1960

## 2016-04-20 ENCOUNTER — Ambulatory Visit (INDEPENDENT_AMBULATORY_CARE_PROVIDER_SITE_OTHER): Payer: Medicare Other | Admitting: Physical Therapy

## 2016-04-20 DIAGNOSIS — R29898 Other symptoms and signs involving the musculoskeletal system: Secondary | ICD-10-CM | POA: Diagnosis not present

## 2016-04-20 DIAGNOSIS — R2689 Other abnormalities of gait and mobility: Secondary | ICD-10-CM

## 2016-04-20 DIAGNOSIS — M25571 Pain in right ankle and joints of right foot: Secondary | ICD-10-CM | POA: Diagnosis not present

## 2016-04-20 NOTE — Therapy (Signed)
[place], [place], [address] Phone: [phone]   Fax:  [phone]  Physical Therapy Treatment  Patient Details  Name: Janet Richards MRN: [id number] Date of Birth: September 16, 1961 Referring Provider: Dr. Georgina Snell  Encounter Date: 04/20/2016      PT End of Session - 04/20/16 1527    Visit Number 5   Number of Visits 12   Date for PT Re-Evaluation 05/13/16   PT Start Time 1525  pt arrived 10 min late   PT Stop Time 1608   PT Time Calculation (min) 43 min   Activity Tolerance Patient tolerated treatment well;No increased pain      Past Medical History  Diagnosis Date  . Urticaria   . Essential hypertension, benign   . Primary osteoarthritis of both knees   . Menstrual migraine     Past Surgical History  Procedure Laterality Date  . Tubal ligation  12/26/1987    There were no vitals filed for this visit.      Subjective Assessment - 04/20/16 1527    Subjective Pt reports she felt good when she left yesterday.  Today her ankle is not as sore.     Currently in Pain? Yes   Pain Score 3    Pain Location Ankle   Pain Orientation Right   Pain Descriptors / Indicators Aching   Aggravating Factors  walking, ascending steps   Pain Relieving Factors ice             [facility] PT Assessment - 04/20/16 0001    Assessment   Medical Diagnosis Rt ankle pain    Referring Provider Dr. Georgina Snell   Onset Date/Surgical Date 03/07/16   Hand Dominance Right   Next MD Visit no return    Prior Therapy none         [facility] Adult PT Treatment/Exercise - 04/20/16 0001    Electrical Stimulation   Electrical Stimulation Location Rt ankle    Electrical Stimulation Action IFC   Electrical Stimulation Parameters to tolerance   Electrical Stimulation Goals Pain   Vasopneumatic   Number Minutes Vasopneumatic  15 minutes   Vasopnuematic Location  Ankle  Rt   Vasopneumatic Pressure Low   Vasopneumatic  Temperature  3*   Ankle Exercises: Aerobic   Stationary Bike Nustep L6 (UE's position 9)  x 4 min    Ankle Exercises: Stretches   Soleus Stretch 3 reps;30 seconds   Gastroc Stretch 4 reps;20 seconds   Ankle Exercises: Standing   SLS 30 sec x 5 gray cushion with hand touch assist as needed for balance; forward leans to chair in Rt SLS x 8 reps (challening)_   Heel Raises 20 reps  eccentric control, heels off step.    Other Standing Ankle Exercises Heel taps down 3" step x 10 reps x 2 sets.    Ankle Exercises: Supine   Other Supine Ankle Exercises AROM inversion / eversion toe taps x 20 each direction; tactile cues to prevent hips from moving.                  PT Education - 04/20/16 1555    Education provided Yes   Education Details HEP- added eversion, inversion AROM for increased motion    Person(s) Educated Patient   Methods Explanation;Demonstration   Comprehension Returned demonstration;Verbalized understanding             PT Long Term Goals - 04/19/16 1237    PT LONG TERM  GOAL #1   Title Increase Rt ankle ROM by 5-10 degrees in all planes 05/13/16   Time 6   Period Weeks   Status On-going   PT LONG TERM GOAL #2   Title 4+/5 to 5/5 strength in Rt ankle 05/13/16   Time 6   Period Weeks   Status On-going   PT LONG TERM GOAL #3   Title Improve gait pattern with no limp Rt LE 05/13/16   Time 6   Period Weeks   Status On-going   PT LONG TERM GOAL #4   Title Independent in HEP 05/13/16   Time 6   Period Weeks   Status On-going   PT LONG TERM GOAL #5   Title Improve FOTO to</= 27% limitation 05/13/16   Time 6   Period Weeks   Status On-going               Plan - 04/20/16 1557    Clinical Impression Statement Pt tolerated all exercises well, without increase in pain.  Motion in ankle improving.  Progressing towards established goals.    Rehab Potential Good   PT Frequency 2x / week   PT Duration 6 weeks   PT Treatment/Interventions Patient/family  education;ADLs/Self Care Home Management;Balance training;Cryotherapy;Electrical Stimulation;Iontophoresis 4mg /ml Dexamethasone;Moist Heat;Traction;Ultrasound;Neuromuscular re-education;Manual techniques;Dry needling;Therapeutic activities;Therapeutic exercise   PT Next Visit Plan progress with ankle stretches as tolerated -; joint mobilization Rt ankle; progress with Rt ankle strengthening as tolerated; modalities as indicated    Consulted and Agree with Plan of Care Patient      Patient will benefit from skilled therapeutic intervention in order to improve the following deficits and impairments:  Postural dysfunction, Improper body mechanics, Pain, Decreased range of motion, Decreased mobility, Decreased strength, Decreased endurance, Decreased activity tolerance, Abnormal gait  Visit Diagnosis: Pain in right ankle and joints of right foot  Other symptoms and signs involving the musculoskeletal system  Other abnormalities of gait and mobility     Problem List Patient Active Problem List   Diagnosis Date Noted  . Chronic dental infection 04/11/2016  . Anemia, iron deficiency 02/25/2016  . Right ankle pain 02/24/2016  . Fatigue 02/24/2016  . Paresthesia 07/01/2015  . History of colonic polyps 03/20/2015  . Hyperlipidemia 02/20/2015  . Chronic allergic rhinitis 01/14/2015  . IFG (impaired fasting glucose) 10/17/2014  . Osteoarthritis of first metatarsophalangeal joint 04/11/2014  . 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 No blood products 03/28/2013  . Essential hypertension, benign 03/01/2013  . Exertional chest pain 02/05/2013  . Abnormal uterine bleeding 02/05/2013  . Primary osteoarthritis of both knees 12/27/2012   Kerin Perna, PTA 04/20/2016 3:58 PM  [place], [place], [address] Phone: [phone]   Fax:  [phone]  Name: BRENDALEE BINION MRN: [id number] Date of Birth:  May 27, 1961

## 2016-04-20 NOTE — Patient Instructions (Signed)
ANKLE: Eversion, Bilateral    Sit at edge of surface, feet on floor. Raise toes of both feet up and move them away from body. Do not move hips or knees. _10__ reps per set, _2__ sets per day, __5_ days per week   * Then tap toes in, 10 times.  Hold knees to prevent hips from moving.    [facility] [place], [place] [address]  [phone] (office) [phone] (fax)

## 2016-04-26 ENCOUNTER — Ambulatory Visit (INDEPENDENT_AMBULATORY_CARE_PROVIDER_SITE_OTHER): Payer: Medicare Other | Admitting: Rehabilitative and Restorative Service Providers"

## 2016-04-26 ENCOUNTER — Encounter: Payer: Self-pay | Admitting: Rehabilitative and Restorative Service Providers"

## 2016-04-26 DIAGNOSIS — M25571 Pain in right ankle and joints of right foot: Secondary | ICD-10-CM | POA: Diagnosis not present

## 2016-04-26 DIAGNOSIS — R29898 Other symptoms and signs involving the musculoskeletal system: Secondary | ICD-10-CM

## 2016-04-26 DIAGNOSIS — R2689 Other abnormalities of gait and mobility: Secondary | ICD-10-CM | POA: Diagnosis not present

## 2016-04-26 NOTE — Therapy (Signed)
[facility] [place], [place], [address] Phone: [phone]   Fax:  [phone]  Physical Therapy Treatment  Patient Details  Name: Janet Richards MRN: [id number] Date of Birth: 1961-04-08 Referring Provider: Dr. Georgina Snell  Encounter Date: 04/26/2016      PT End of Session - 04/26/16 0943    Visit Number 6   Number of Visits 12   Date for PT Re-Evaluation 05/13/16   PT Start Time 0940   PT Stop Time 1018   PT Time Calculation (min) 38 min   Activity Tolerance Patient tolerated treatment well      Past Medical History  Diagnosis Date  . Urticaria   . Essential hypertension, benign   . Primary osteoarthritis of both knees   . Menstrual migraine     Past Surgical History  Procedure Laterality Date  . Tubal ligation  12/26/1987    There were no vitals filed for this visit.      Subjective Assessment - 04/26/16 0944    Subjective Ankle is doing "pretty good"    Currently in Pain? No/denies            [facility] PT Assessment - 04/26/16 0001    Assessment   Medical Diagnosis Rt ankle pain    Referring Provider Dr. Georgina Snell   Onset Date/Surgical Date 03/07/16   Hand Dominance Right   Next MD Visit no return    Prior Therapy none   AROM   Right Ankle Dorsiflexion 10   Right Ankle Plantar Flexion 61   Strength   Right Ankle Dorsiflexion 5/5   Right Ankle Plantar Flexion 4+/5   Right Ankle Inversion 5/5   Right Ankle Eversion 5/5   Palpation   Palpation comment minimal tenderness to palpation through the Rt medial and lateral malleolus and talus globally                      [facility] Adult PT Treatment/Exercise - 04/26/16 0001    Vasopneumatic   Number Minutes Vasopneumatic  15 minutes   Vasopnuematic Location  Ankle  Rt   Vasopneumatic Pressure Low   Vasopneumatic Temperature  3*   Manual Therapy   Joint Mobilization Rt ankle talocrual and calcaneous   Ankle Exercises: Aerobic    Stationary Bike L5 x 4 min    Ankle Exercises: Stretches   Soleus Stretch 2 reps;30 seconds   Gastroc Stretch 2 reps;30 seconds   Other Stretch pro stretch 30 sec x 3 - Lt foot still in wt bearing    Ankle Exercises: Standing   SLS 30 sec x 5 gray cushion with hand touch assist as needed for balance; forward leans to chair in Rt SLS x 8 reps (challening)_   Heel Raises 20 reps  eccentric control, heels off step.    Other Standing Ankle Exercises step up x 30 Rt forward and x30 lateral                      PT Long Term Goals - 04/26/16 0948    PT LONG TERM GOAL #1   Title Increase Rt ankle ROM by 5-10 degrees in all planes 05/13/16   Time 6   Period Weeks   Status Achieved   PT LONG TERM GOAL #2   Title 4+/5 to 5/5 strength in Rt ankle 05/13/16   Time 6   Period Weeks   Status Achieved   PT LONG TERM GOAL #  3   Title Improve gait pattern with no limp Rt LE 05/13/16   Time 6   Period Weeks   Status Achieved   PT LONG TERM GOAL #4   Title Independent in HEP 05/13/16   Time 6   Period Weeks   Status On-going   PT LONG TERM GOAL #5   Title Improve FOTO to</= 27% limitation 05/13/16   Time 6   Period Weeks   Status On-going               Plan - 04/26/16 0946    Clinical Impression Statement Progressing well with ankle rehab. Continues to have some intermittent pain or discomfort but not constant which it was. Working on exercises at home but feels she will benefit form continuing PT to gain strength. Anticipate d/c to I HEP in 1/2 weeks    Rehab Potential Good   PT Frequency 2x / week   PT Duration 6 weeks   PT Treatment/Interventions Patient/family education;ADLs/Self Care Home Management;Balance training;Cryotherapy;Electrical Stimulation;Iontophoresis 4mg /ml Dexamethasone;Moist Heat;Traction;Ultrasound;Neuromuscular re-education;Manual techniques;Dry needling;Therapeutic activities;Therapeutic exercise   PT Next Visit Plan progress with ankle stretches as  tolerated -; joint mobilization Rt ankle; progress with Rt ankle strengthening as tolerated; modalities as indicated - will continue PT 2x/wk this week and decrease to 1x/wk next week - anticipate d/c in 1-2 weeks    Consulted and Agree with Plan of Care Patient      Patient will benefit from skilled therapeutic intervention in order to improve the following deficits and impairments:  Postural dysfunction, Improper body mechanics, Pain, Decreased range of motion, Decreased mobility, Decreased strength, Decreased endurance, Decreased activity tolerance, Abnormal gait  Visit Diagnosis: Pain in right ankle and joints of right foot  Other symptoms and signs involving the musculoskeletal system  Other abnormalities of gait and mobility     Problem List Patient Active Problem List   Diagnosis Date Noted  . Chronic dental infection 04/11/2016  . Anemia, iron deficiency 02/25/2016  . Right ankle pain 02/24/2016  . Fatigue 02/24/2016  . Paresthesia 07/01/2015  . History of colonic polyps 03/20/2015  . Hyperlipidemia 02/20/2015  . Chronic allergic rhinitis 01/14/2015  . IFG (impaired fasting glucose) 10/17/2014  . Osteoarthritis of first metatarsophalangeal joint 04/11/2014  . 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 No blood products 03/28/2013  . Essential hypertension, benign 03/01/2013  . Exertional chest pain 02/05/2013  . Abnormal uterine bleeding 02/05/2013  . Primary osteoarthritis of both knees 12/27/2012    Janet Richards PT, MPH  04/26/2016, 10:09 AM  [facility] [place], [place], [address] Phone: [phone]   Fax:  [phone]  Name: Janet Richards MRN: [id number] Date of Birth: 04/11/1961

## 2016-04-28 ENCOUNTER — Encounter: Payer: Self-pay | Admitting: Physical Therapy

## 2016-04-28 ENCOUNTER — Ambulatory Visit (INDEPENDENT_AMBULATORY_CARE_PROVIDER_SITE_OTHER): Payer: Medicare Other | Admitting: Physical Therapy

## 2016-04-28 DIAGNOSIS — R29898 Other symptoms and signs involving the musculoskeletal system: Secondary | ICD-10-CM | POA: Diagnosis not present

## 2016-04-28 DIAGNOSIS — M25571 Pain in right ankle and joints of right foot: Secondary | ICD-10-CM | POA: Diagnosis not present

## 2016-04-28 DIAGNOSIS — R2689 Other abnormalities of gait and mobility: Secondary | ICD-10-CM | POA: Diagnosis not present

## 2016-04-28 NOTE — Therapy (Signed)
[facility] [place], [place], [address] Phone: [phone]   Fax:  [phone]  Physical Therapy Treatment  Patient Details  Name: Janet Richards MRN: [id number] Date of Birth: 1961/08/31 Referring Provider: Dr. Georgina Snell   Encounter Date: 04/28/2016      PT End of Session - 04/28/16 1538    Visit Number 7   Number of Visits 12   Date for PT Re-Evaluation 05/13/16   PT Start Time 1534   PT Stop Time 1624   PT Time Calculation (min) 50 min   Activity Tolerance Patient tolerated treatment well;No increased pain      Past Medical History  Diagnosis Date  . Urticaria   . Essential hypertension, benign   . Primary osteoarthritis of both knees   . Menstrual migraine     Past Surgical History  Procedure Laterality Date  . Tubal ligation  12/26/1987    There were no vitals filed for this visit.      Subjective Assessment - 04/28/16 1539    Subjective Pt reports she can wear heels just fine, without discomfort, but still cannot wear flat shoes without pain. If she walks too much, the pain increases.    Currently in Pain? Yes   Pain Score 3    Pain Location Ankle   Pain Orientation Right   Pain Descriptors / Indicators Aching   Aggravating Factors  prolonged walking   Pain Relieving Factors ice             [facility] PT Assessment - 04/28/16 0001    Assessment   Medical Diagnosis Rt ankle pain    Referring Provider Dr. Georgina Snell    Onset Date/Surgical Date 03/07/16   Hand Dominance Right   Next MD Visit no return    Prior Therapy none          [facility] Adult PT Treatment/Exercise - 04/28/16 0001    Vasopneumatic   Number Minutes Vasopneumatic  10 minutes   Vasopnuematic Location  Ankle   Vasopneumatic Pressure Low   Vasopneumatic Temperature  3*   Ankle Exercises: Aerobic   Stationary Bike L5 x 5 min    Ankle Exercises: Standing   BAPS Standing;Level 2;10 reps  PF/DF, inv/ever, CW/ CCW   Heel Walk (Round Trip) 20 ft   Toe Walk (Round Trip) 20 ft    Other Standing Ankle Exercises Rt SLS forward leans to chair seat height x 10    Other Standing Ankle Exercises Heel taps down 6" step x 10 reps x 2 sets.    Ankle Exercises: Stretches   Soleus Stretch 30 seconds;3 reps   Gastroc Stretch 30 seconds;3 reps   Ankle Exercises: Seated   Other Seated Ankle Exercises Rt ankle eversion with green band x 2 sets of 10, inversion and DF with green band x 1 set of 10            PT Long Term Goals - 04/26/16 0948    PT LONG TERM GOAL #1   Title Increase Rt ankle ROM by 5-10 degrees in all planes 05/13/16   Time 6   Period Weeks   Status Achieved   PT LONG TERM GOAL #2   Title 4+/5 to 5/5 strength in Rt ankle 05/13/16   Time 6   Period Weeks   Status Achieved   PT LONG TERM GOAL #3   Title Improve gait pattern with no limp Rt LE 05/13/16   Time 6  Period Weeks   Status Achieved   PT LONG TERM GOAL #4   Title Independent in HEP 05/13/16   Time 6   Period Weeks   Status On-going   PT LONG TERM GOAL #5   Title Improve FOTO to</= 27% limitation 05/13/16   Time 6   Period Weeks   Status On-going               Plan - 04/28/16 1634    Clinical Impression Statement Pt reported decrease in Rt ankle pain by 2 points with exercise and further reduction with use of vaso at end of session.  Progressing well towards remaining goals.    Rehab Potential Good   PT Frequency 1x / week   PT Duration 4 weeks  per last PT note.    PT Treatment/Interventions Patient/family education;ADLs/Self Care Home Management;Balance training;Cryotherapy;Electrical Stimulation;Iontophoresis 4mg /ml Dexamethasone;Moist Heat;Traction;Ultrasound;Neuromuscular re-education;Manual techniques;Dry needling;Therapeutic activities;Therapeutic exercise   PT Next Visit Plan progress with ankle stretches as tolerated -; joint mobilization Rt ankle; progress with Rt ankle strengthening as tolerated; modalities as  indicated    Consulted and Agree with Plan of Care Patient      Patient will benefit from skilled therapeutic intervention in order to improve the following deficits and impairments:  Postural dysfunction, Improper body mechanics, Pain, Decreased range of motion, Decreased mobility, Decreased strength, Decreased endurance, Decreased activity tolerance, Abnormal gait  Visit Diagnosis: Pain in right ankle and joints of right foot  Other symptoms and signs involving the musculoskeletal system  Other abnormalities of gait and mobility     Problem List Patient Active Problem List   Diagnosis Date Noted  . Chronic dental infection 04/11/2016  . Anemia, iron deficiency 02/25/2016  . Right ankle pain 02/24/2016  . Fatigue 02/24/2016  . Paresthesia 07/01/2015  . History of colonic polyps 03/20/2015  . Hyperlipidemia 02/20/2015  . Chronic allergic rhinitis 01/14/2015  . IFG (impaired fasting glucose) 10/17/2014  . Osteoarthritis of first metatarsophalangeal joint 04/11/2014  . 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 No blood products 03/28/2013  . Essential hypertension, benign 03/01/2013  . Exertional chest pain 02/05/2013  . Abnormal uterine bleeding 02/05/2013  . Primary osteoarthritis of both knees 12/27/2012   Kerin Perna, PTA 04/28/2016 4:38 PM  [facility] [place], [place], [address] Phone: [phone]   Fax:  [phone]  Name: ANGELIYAH DALUZ MRN: [id number] Date of Birth: 25-Nov-1960

## 2016-05-03 ENCOUNTER — Encounter: Payer: Self-pay | Admitting: Physical Therapy

## 2016-05-05 ENCOUNTER — Encounter: Payer: Self-pay | Admitting: Family Medicine

## 2016-05-05 ENCOUNTER — Ambulatory Visit (INDEPENDENT_AMBULATORY_CARE_PROVIDER_SITE_OTHER): Payer: Medicare Other | Admitting: Family Medicine

## 2016-05-05 ENCOUNTER — Ambulatory Visit (INDEPENDENT_AMBULATORY_CARE_PROVIDER_SITE_OTHER): Payer: Medicare Other | Admitting: Physical Therapy

## 2016-05-05 VITALS — BP 135/70 | HR 92 | Wt 190.0 lb

## 2016-05-05 DIAGNOSIS — M25571 Pain in right ankle and joints of right foot: Secondary | ICD-10-CM | POA: Diagnosis not present

## 2016-05-05 DIAGNOSIS — R2689 Other abnormalities of gait and mobility: Secondary | ICD-10-CM | POA: Diagnosis not present

## 2016-05-05 DIAGNOSIS — H60393 Other infective otitis externa, bilateral: Secondary | ICD-10-CM | POA: Diagnosis not present

## 2016-05-05 DIAGNOSIS — R29898 Other symptoms and signs involving the musculoskeletal system: Secondary | ICD-10-CM

## 2016-05-05 MED ORDER — NEOMYCIN-POLYMYXIN-HC 3.5-10000-1 OT SOLN: 4.0000 [drp] | Freq: Four times a day (QID) | OTIC | Status: DC

## 2016-05-05 MED ORDER — PREDNISONE 5 MG (48) PO TBPK: ORAL_TABLET | ORAL | Status: DC

## 2016-05-05 NOTE — Patient Instructions (Signed)
Thank you for coming in today. Use the drops.  Take prednisone as needed.  Return as needed.

## 2016-05-05 NOTE — Progress Notes (Signed)
Janet Richards is a 55 y.o. female who presents to [facility] today for bilateral ear itching. Symptoms present for a few days associated with a mild sore throat. No fevers chills nausea vomiting or diarrhea. Patient used an old leftover eardrops which helped a bit. She can't remember exactly what it was.   Past Medical History  Diagnosis Date  . Urticaria   . Essential hypertension, benign   . Primary osteoarthritis of both knees   . Menstrual migraine    Past Surgical History  Procedure Laterality Date  . Tubal ligation  12/26/1987   Social History  Substance Use Topics  . Smoking status: Former Smoker -- 1.50 packs/day    Types: Cigarettes    Quit date: 02/05/2006  . Smokeless tobacco: Never Used  . Alcohol Use: Yes     Comment: rarely   family history includes CAD in her father; Hypertension in her mother.  ROS as above:  Medications: Current Outpatient Prescriptions  Medication Sig Dispense Refill  . albuterol (PROVENTIL HFA;VENTOLIN HFA) 108 (90 Base) MCG/ACT inhaler Inhale two puffs every 4-6 hours only as needed for shortness of breath or wheezing. 1 Inhaler 1  . cetirizine (ZYRTEC) 1 MG/ML syrup TAKE 10 MLS (10 MG TOTAL) BY MOUTH DAILY. 944 mL 6  . chlorhexidine (PERIDEX) 0.12 % solution Use as directed 15 mLs in the mouth or throat 2 (two) times daily. 120 mL 0  . cyclobenzaprine (FLEXERIL) 10 MG tablet Take a half to a full tab every 8-12 hours only as needed for muscle spasm, may cause sedation. 45 tablet 0  . ferrous sulfate 325 (65 FE) MG tablet Take 325 mg by mouth daily with breakfast.    . Garlic 123XX123 MG CAPS Take by mouth.    . hydrOXYzine (ATARAX/VISTARIL) 10 MG tablet Take 10 mg by mouth 3 (three) times daily as needed for itching.     [name] ipratropium (ATROVENT) 0.06 % nasal spray Place 2 sprays into both nostrils 4 (four) times daily.  15 mL 1  . montelukast (SINGULAIR) 10 MG tablet Take 10 mg by mouth at bedtime.     . pantoprazole (PROTONIX) 40 MG tablet TAKE 1 TABLET (40 MG TOTAL) BY MOUTH DAILY. 90 tablet 1  . triamcinolone cream (KENALOG) 0.1 % Apply 1 application topically 2 (two) times daily. Use for 2 to 4 days until improved 15 g 0  . neomycin-polymyxin-hydrocortisone (CORTISPORIN) otic solution Place 4 drops into both ears 4 (four) times daily. 10 mL 1  . predniSONE (STERAPRED UNI-PAK 48 TAB) 5 MG (48) TBPK tablet 12 day dosepack po 48 tablet 2   No current facility-administered medications for this visit.   Allergies  Allergen Reactions  . Aspirin Rash    Patient indicates increases Urticaria.   . Azithromycin Nausea And Vomiting  . Morphine And Related Rash    Urticaria   . Augmentin [Amoxicillin-Pot Clavulanate] Diarrhea  . Clindamycin/Lincomycin     Facial swelling  . Cortisporin [Bacitra-Neomycin-Polymyxin-Hc]   . Erythromycin Nausea And Vomiting    Has taken azithromycin with no issues in the past.   . Metronidazole     Rash  . Nsaids Swelling     Exam:  BP 135/70 mmHg  Pulse 92  Wt 190 lb (86.183 kg) Gen: Well NAD HEENT: EOMI,  MMM External ear canals mildly erythematous. Normal tympanic membranes bilaterally. Normal posterior pharynx. No cervical lymphadenopathy present. Mastoids are nontender bilaterally. Lungs:  Normal work of breathing. CTABL Heart: RRR no MRG Abd: NABS, Soft. Nondistended, Nontender Exts: Brisk capillary refill, warm and well perfused.   No results found for this or any previous visit (from the past 24 hour(s)). No results found.    Assessment and Plan: 55 y.o. female with otitis externa. Mildly irritated/itchy. Use Cortisporin drops. Use prednisone as needed.  Discussed warning signs or symptoms. Please see discharge instructions. Patient expresses understanding.

## 2016-05-05 NOTE — Therapy (Addendum)
[facility], [place], [address] Phone: [phone]   Fax:  [phone]  Physical Therapy Treatment  Patient Details  Name: Janet Richards MRN: [id number] Date of Birth: 05-13-1961 Referring Provider: Dr. Sherene Sires  Encounter Date: 05/05/2016      PT End of Session - 05/05/16 1028    Visit Number 8   Number of Visits 12   Date for PT Re-Evaluation 05/13/16   PT Start Time 1028  pt arrived late   PT Stop Time 1111   PT Time Calculation (min) 43 min   Activity Tolerance Patient tolerated treatment well      Past Medical History  Diagnosis Date  . Urticaria   . Essential hypertension, benign   . Primary osteoarthritis of both knees   . Menstrual migraine     Past Surgical History  Procedure Laterality Date  . Tubal ligation  12/26/1987    There were no vitals filed for this visit.      Subjective Assessment - 05/05/16 1029    Subjective Pt reports her Rt ankle has "been pretty good".  Pt voices she is ready to d/c after session today.    Currently in Pain? No/denies            [facility] PT Assessment - 05/05/16 0001    Assessment   Medical Diagnosis Rt ankle pain    Referring Provider Dr. Sherene Sires   Onset Date/Surgical Date 03/07/16   Hand Dominance Right   Next MD Visit PRN   Observation/Other Assessments   Focus on Therapeutic Outcomes (FOTO)  11% limited.    Strength   Right Ankle Dorsiflexion 5/5   Right Ankle Plantar Flexion 4+/5   Right Ankle Inversion 5/5   Right Ankle Eversion 5/5          [facility] Adult PT Treatment/Exercise - 05/05/16 0001    Vasopneumatic   Number Minutes Vasopneumatic  15 minutes   Vasopnuematic Location  Ankle   Vasopneumatic Pressure Medium   Vasopneumatic Temperature  3*   Ankle Exercises: Aerobic   Stationary Bike NuStep L6: 4 min   Ankle Exercises: Standing   Heel Raises 20 reps  LLE, 12 reps on RLE   Heel Walk (Round Trip) 20  ft   Toe Walk (Round Trip) 20 ft    Other Standing Ankle Exercises Rt SLS forward leans to chair height x 10 (improved)   Other Standing Ankle Exercises Heel taps down 6" step x 10 reps x 1 set.    Ankle Exercises: Stretches   Soleus Stretch 30 seconds;3 reps   Gastroc Stretch 30 seconds;3 reps   Ankle Exercises: Seated   Other Seated Ankle Exercises Rt ankle eversion with blue band x 2 sets of 10, inversion,eversion, and DF           PT Long Term Goals - 05/05/16 1032    PT LONG TERM GOAL #1   Title Increase Rt ankle ROM by 5-10 degrees in all planes 05/13/16   Time 6   Period Weeks   Status Achieved   PT LONG TERM GOAL #2   Title 4+/5 to 5/5 strength in Rt ankle 05/13/16   Time 6   Period Weeks   Status Achieved   PT LONG TERM GOAL #3   Title Improve gait pattern with no limp Rt LE 05/13/16   Time 6   Period Weeks   Status Achieved   PT LONG TERM GOAL #4  Title Independent in HEP 05/13/16   Time 6   Period Weeks   Status Achieved   PT LONG TERM GOAL #5   Title Improve FOTO to</= 27% limitation 05/13/16   Time 6   Period Weeks   Status Achieved               Plan - 05/05/16 1052    Clinical Impression Statement Pt tolerated increased resistance with Rt ankle exercises without increase in pain. Pt has met all her goals and is agreeable to d/c to HEP at this time.    Rehab Potential Good   PT Frequency 1x / week   PT Duration 4 weeks   PT Treatment/Interventions Patient/family education;ADLs/Self Care Home Management;Balance training;Cryotherapy;Electrical Stimulation;Iontophoresis 86m/ml Dexamethasone;Moist Heat;Traction;Ultrasound;Neuromuscular re-education;Manual techniques;Dry needling;Therapeutic activities;Therapeutic exercise   PT Next Visit Plan Spoke to supervising PT; will d/c to HEP.    Consulted and Agree with Plan of Care Patient      Patient will benefit from skilled therapeutic intervention in order to improve the following deficits and impairments:   Postural dysfunction, Improper body mechanics, Pain, Decreased range of motion, Decreased mobility, Decreased strength, Decreased endurance, Decreased activity tolerance, Abnormal gait  Visit Diagnosis: Pain in right ankle and joints of right foot  Other symptoms and signs involving the musculoskeletal system  Other abnormalities of gait and mobility     Problem List Patient Active Problem List   Diagnosis Date Noted  . Chronic dental infection 04/11/2016  . Anemia, iron deficiency 02/25/2016  . Right ankle pain 02/24/2016  . Fatigue 02/24/2016  . Paresthesia 07/01/2015  . History of colonic polyps 03/20/2015  . Hyperlipidemia 02/20/2015  . Chronic allergic rhinitis 01/14/2015  . IFG (impaired fasting glucose) 10/17/2014  . Osteoarthritis of first metatarsophalangeal joint 04/11/2014  . 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 No blood products 03/28/2013  . Essential hypertension, benign 03/01/2013  . Exertional chest pain 02/05/2013  . Abnormal uterine bleeding 02/05/2013  . Primary osteoarthritis of both knees 12/27/2012   JKerin Perna PTA 05/05/2016 1:09 PM  [place] [address]: [phone]  Fax:  [phone] Name: Janet FADERMRN: [id number] of Birth: 7March 31, 1962   PHYSICAL THERAPY DISCHARGE SUMMARY  Visits from Start of Care: 8  Current functional level related to goals / functional outcomes: See progress note for discharge status   Remaining deficits: Needs to continue with HEP to avoid recurrent problems   Education / Equipment: HEP Plan: Patient agrees to discharge.  Patient goals were met. Patient is being discharged due to meeting the stated rehab goals.  ?????    Celyn P. HHelene KelpPT, MPH 06/24/16 9:10 AM

## 2016-05-11 ENCOUNTER — Telehealth: Payer: Self-pay

## 2016-05-11 MED ORDER — CEFDINIR 300 MG PO CAPS: 300.0000 mg | ORAL_CAPSULE | Freq: Two times a day (BID) | ORAL | Status: AC

## 2016-05-11 NOTE — Telephone Encounter (Signed)
I've sent cefdinir to [facility] on [address]

## 2016-05-11 NOTE — Telephone Encounter (Signed)
Pt was seen by Dr. Georgina Snell on 05/05/16.  She reports that she is having congestion, runny nose, and cough and is requesting an antibiotic. Please advise.

## 2016-05-11 NOTE — Telephone Encounter (Signed)
Pt.notified

## 2016-05-17 ENCOUNTER — Encounter: Payer: Self-pay | Admitting: Emergency Medicine

## 2016-05-17 ENCOUNTER — Emergency Department (INDEPENDENT_AMBULATORY_CARE_PROVIDER_SITE_OTHER)
Admission: EM | Admit: 2016-05-17 | Discharge: 2016-05-17 | Disposition: A | Discharge status: Home / Self Care | Payer: Medicare Other | Source: Home / Self Care

## 2016-05-17 DIAGNOSIS — K047 Periapical abscess without sinus: Secondary | ICD-10-CM | POA: Diagnosis not present

## 2016-05-17 DIAGNOSIS — K029 Dental caries, unspecified: Secondary | ICD-10-CM

## 2016-05-17 MED ORDER — CHLORHEXIDINE GLUCONATE 0.12 % MT SOLN: 15.0000 mL | Freq: Two times a day (BID) | OROMUCOSAL | Status: DC

## 2016-05-17 MED ORDER — FLUCONAZOLE 150 MG PO TABS: 150.0000 mg | ORAL_TABLET | Freq: Once | ORAL | Status: DC

## 2016-05-17 MED ORDER — AMOXICILLIN-POT CLAVULANATE 875-125 MG PO TABS
1.0000 | ORAL_TABLET | Freq: Two times a day (BID) | ORAL | Status: DC
Start: 2016-05-17 — End: 2016-05-24

## 2016-05-17 NOTE — Discharge Instructions (Signed)
Be sure to eat a meal while taking your antibiotics.  You may help prevent diarrhea and stomach upset while taking antibiotics by eating yogurt with probiotics and/or taking over the counter probiotics (powder that can be added to drinks, or gummies) ask your pharmacist for assistance finding the best kind for you.  Dental Abscess A dental abscess is a collection of pus in or around a tooth. CAUSES This condition is caused by a bacterial infection around the root of the tooth that involves the inner part of the tooth (pulp). It may result from:  Severe tooth decay.  Trauma to the tooth that allows bacteria to enter into the pulp, such as a broken or chipped tooth.  Severe gum disease around a tooth. SYMPTOMS Symptoms of this condition include:  Severe pain in and around the infected tooth.  Swelling and redness around the infected tooth, in the mouth, or in the face.  Tenderness.  Pus drainage.  Bad breath.  Bitter taste in the mouth.  Difficulty swallowing.  Difficulty opening the mouth.  Nausea.  Vomiting.  Chills.  Swollen neck glands.  Fever. DIAGNOSIS This condition is diagnosed with examination of the infected tooth. During the exam, your dentist may tap on the infected tooth. Your dentist will also ask about your medical and dental history and may order X-rays. TREATMENT This condition is treated by eliminating the infection. This may be done with:  Antibiotic medicine.  A root canal. This may be performed to save the tooth.  Pulling (extracting) the tooth. This may also involve draining the abscess. This is done if the tooth cannot be saved. HOME CARE INSTRUCTIONS  Take medicines only as directed by your dentist.  If you were prescribed antibiotic medicine, finish all of it even if you start to feel better.  Rinse your mouth (gargle) often with salt water to relieve pain or swelling.  Do not drive or operate heavy machinery while taking pain  medicine.  Do not apply heat to the outside of your mouth.  Keep all follow-up visits as directed by your dentist. This is important. SEEK MEDICAL CARE IF:  Your pain is worse and is not helped by medicine. SEEK IMMEDIATE MEDICAL CARE IF:  You have a fever or chills.  Your symptoms suddenly get worse.  You have a very bad headache.  You have problems breathing or swallowing.  You have trouble opening your mouth.  You have swelling in your neck or around your eye.   This information is not intended to replace advice given to you by your health care provider. Make sure you discuss any questions you have with your health care provider.   Document Released: 10/24/2005 Document Revised: 03/10/2015 Document Reviewed: 10/21/2014 Elsevier Interactive Patient Education 2016 [place] and Dentist Visits Dental care supports good overall health. Regular dental visits can also help you avoid dental pain, bleeding, infection, and other more serious health problems in the future. It is important to keep the mouth healthy because diseases in the teeth, gums, and other oral tissues can spread to other areas of the body. Some problems, such as diabetes, heart disease, and pre-term labor have been associated with poor oral health.  See your dentist every 6 months. If you experience emergency problems such as a toothache or broken tooth, go to the dentist right away. If you see your dentist regularly, you may catch problems early. It is easier to be treated for problems in the early stages.  WHAT  TO EXPECT AT A DENTIST VISIT  Your dentist will look for many common oral health problems and recommend proper treatment. At your regular dental visit, you can expect:  Gentle cleaning of the teeth and gums. This includes scraping and polishing. This helps to remove the sticky substance around the teeth and gums (plaque). Plaque forms in the mouth shortly after eating. Over time, plaque  hardens on the teeth as tartar. If tartar is not removed regularly, it can cause problems. Cleaning also helps remove stains.  Periodic X-rays. These pictures of the teeth and supporting bone will help your dentist assess the health of your teeth.  Periodic fluoride treatments. Fluoride is a natural mineral shown to help strengthen teeth. Fluoride treatmentinvolves applying a fluoride gel or varnish to the teeth. It is most commonly done in children.  Examination of the mouth, tongue, jaws, teeth, and gums to look for any oral health problems, such as:  Cavities (dental caries). This is decay on the tooth caused by plaque, sugar, and acid in the mouth. It is best to catch a cavity when it is small.  Inflammation of the gums caused by plaque buildup (gingivitis).  Problems with the mouth or malformed or misaligned teeth.  Oral cancer or other diseases of the soft tissues or jaws. KEEP YOUR TEETH AND GUMS HEALTHY For healthy teeth and gums, follow these general guidelines as well as your dentist's specific advice:  Have your teeth professionally cleaned at the dentist every 6 months.  Brush twice daily with a fluoride toothpaste.  Floss your teeth daily.  Ask your dentist if you need fluoride supplements, treatments, or fluoride toothpaste.  Eat a healthy diet. Reduce foods and drinks with added sugar.  Avoid smoking. TREATMENT FOR ORAL HEALTH PROBLEMS If you have oral health problems, treatment varies depending on the conditions present in your teeth and gums.  Your caregiver will most likely recommend good oral hygiene at each visit.  For cavities, gingivitis, or other oral health disease, your caregiver will perform a procedure to treat the problem. This is typically done at a separate appointment. Sometimes your caregiver will refer you to another dental specialist for specific tooth problems or for surgery. SEEK IMMEDIATE DENTAL CARE IF:  You have pain, bleeding, or  soreness in the gum, tooth, jaw, or mouth area.  A permanent tooth becomes loose or separated from the gum socket.  You experience a blow or injury to the mouth or jaw area.   This information is not intended to replace advice given to you by your health care provider. Make sure you discuss any questions you have with your health care provider.   Document Released: 07/06/2011 Document Revised: 01/16/2012 Document Reviewed: 07/06/2011 Elsevier Interactive Patient Education [employer].

## 2016-05-17 NOTE — ED Provider Notes (Signed)
CSN: [id number]     Arrival date & time 05/17/16  1404 History   None    Chief Complaint  Patient presents with  . Dental Pain   (Consider location/radiation/quality/duration/timing/severity/associated sxs/prior Treatment) HPI  Janet Richards is a 55 y.o. female presenting to UC with c/o gradually worsening Right upper tooth and dental pain for 1 week. Pain is aching and throbbing, waxes and wane, minimal at this time but worse with eating or when cold air or drinks contact her teeth.  She reports having similar pain due to a dental abscess in Left upper tooth, which she has since had removed and was doing well until last week.  Pt was seen at [facility] last time she had abscessed tooth on 03/18/16 and was seen again by PCP on 04/11/16 for tooth infection.  In May, she was tx with Augmentin and Peridex oral solution. This helped significantly with pain and swelling, more so than just PCN alone, which she has had in the past. Denies fever, chills, n/v/d. She plans on having 2 more teeth removed but has to wait until the end of the month as she pays out of pocket.    Past Medical History  Diagnosis Date  . Urticaria   . Essential hypertension, benign   . Primary osteoarthritis of both knees   . Menstrual migraine    Past Surgical History  Procedure Laterality Date  . Tubal ligation  12/26/1987   Family History  Problem Relation Age of Onset  . Hypertension Mother   . CAD Father     MI in his 71s   Social History  Substance Use Topics  . Smoking status: Former Smoker -- 1.50 packs/day    Types: Cigarettes    Quit date: 02/05/2006  . Smokeless tobacco: Never Used  . Alcohol Use: Yes     Comment: rarely   OB History    Gravida Para Term Preterm AB TAB SAB Ectopic Multiple Living   5 3 3  2 2    3      Review of Systems  Constitutional: Negative for fever and chills.  HENT: Positive for dental problem (dental pain). Negative for sore throat, trouble swallowing and voice change.    Gastrointestinal: Negative for nausea and vomiting.  Neurological: Negative for dizziness, light-headedness and headaches.    Allergies  Aspirin; Azithromycin; Morphine and related; Augmentin; Clindamycin/lincomycin; Cortisporin; Erythromycin; Metronidazole; and Nsaids  Home Medications   Prior to Admission medications   Medication Sig Start Date End Date Taking? Authorizing Provider  albuterol (PROVENTIL HFA;VENTOLIN HFA) 108 (90 Base) MCG/ACT inhaler Inhale two puffs every 4-6 hours only as needed for shortness of breath or wheezing. 01/19/16 01/18/17  [name], MD  amoxicillin-clavulanate (AUGMENTIN) 875-125 MG tablet Take 1 tablet by mouth 2 (two) times daily. One po bid x 7 days 05/17/16   Noland Fordyce, PA-C  cefdinir (OMNICEF) 300 MG capsule Take 1 capsule (300 mg total) by mouth 2 (two) times daily. 05/11/16 05/21/16  Sean Hommel, DO  cetirizine (ZYRTEC) 1 MG/ML syrup TAKE 10 MLS (10 MG TOTAL) BY MOUTH DAILY. 02/04/16   [name], MD  chlorhexidine (PERIDEX) 0.12 % solution Use as directed 15 mLs in the mouth or throat 2 (two) times daily. 05/17/16   Noland Fordyce, PA-C  cyclobenzaprine (FLEXERIL) 10 MG tablet Take a half to a full tab every 8-12 hours only as needed for muscle spasm, may cause sedation. 08/08/13   Marcial Pacas, DO  ferrous sulfate 325 (65  FE) MG tablet Take 325 mg by mouth daily with breakfast.    Historical Provider, MD  fluconazole (DIFLUCAN) 150 MG tablet Take 1 tablet (150 mg total) by mouth once. 05/17/16   Noland Fordyce, PA-C  Garlic 123XX123 MG CAPS Take by mouth.    Historical Provider, MD  hydrOXYzine (ATARAX/VISTARIL) 10 MG tablet Take 10 mg by mouth 3 (three) times daily as needed for itching.     Historical Provider, MD  ipratropium (ATROVENT) 0.06 % nasal spray Place 2 sprays into both nostrils 4 (four) times daily. 12/22/15   [name], MD  montelukast (SINGULAIR) 10 MG tablet Take 10 mg by mouth at bedtime.  04/04/13   Historical Provider, MD   neomycin-polymyxin-hydrocortisone (CORTISPORIN) otic solution Place 4 drops into both ears 4 (four) times daily. 05/05/16   [name], MD  pantoprazole (PROTONIX) 40 MG tablet TAKE 1 TABLET (40 MG TOTAL) BY MOUTH DAILY. 02/05/16   [name], MD  predniSONE (STERAPRED UNI-PAK 48 TAB) 5 MG (48) TBPK tablet 12 day dosepack po 05/05/16   [name], MD  triamcinolone cream (KENALOG) 0.1 % Apply 1 application topically 2 (two) times daily. Use for 2 to 4 days until improved 02/20/16   Kandra Nicolas, MD   Meds Ordered and Administered this Visit  Medications - No data to display  BP 126/82 mmHg  Pulse 89  Temp(Src) 98.3 F (36.8 C) (Oral)  Resp 16  Ht 5\' 3"  (1.6 m)  Wt 191 lb (86.637 kg)  BMI 33.84 kg/m2  SpO2 96% No data found.   Physical Exam  Constitutional: She is oriented to person, place, and time. She appears well-developed and well-nourished.  HENT:  Head: Normocephalic and atraumatic.  Mouth/Throat: Uvula is midline, oropharynx is clear and moist and mucous membranes are normal. No trismus in the jaw. Abnormal dentition. Dental caries present. No dental abscesses or uvula swelling.  Right upper teeth- two have mild to moderate dental decay into gingiva around root foot the teeth. Mild gingival erythema. No edema, drainage or bleeding. Teeth are tender to touch.  Eyes: EOM are normal.  Neck: Normal range of motion.  Cardiovascular: Normal rate.   Pulmonary/Chest: Effort normal.  Musculoskeletal: Normal range of motion.  Neurological: She is alert and oriented to person, place, and time.  Skin: Skin is warm and dry.  Psychiatric: She has a normal mood and affect. Her behavior is normal.  Nursing note and vitals reviewed.   ED Course  Procedures (including critical care time)  Labs Review Labs Reviewed - No data to display  Imaging Review No results found.   MDM   1. Dental decay   2. Pain due to dental caries   3. Dental abscess    Pt presenting to [facility]  with c/o dental pain secondary to dental decay, concern for early dental abscess. No indication for I&D at this time.  Rx: Augmentin and peridex.  Encouraged to f/u as planned at end of the month with dentist for tooth extraction(s) as antibiotics are only temporary treatment. Patient verbalized understanding and agreement with treatment plan.    Noland Fordyce, PA-C 05/17/16 [phone]

## 2016-05-17 NOTE — ED Notes (Signed)
Having dental pain upper right area; knows she needs an extraction but cannot afford at this time; states pain for one week.

## 2016-05-23 ENCOUNTER — Ambulatory Visit: Payer: Self-pay | Admitting: Family Medicine

## 2016-05-23 ENCOUNTER — Encounter (HOSPITAL_BASED_OUTPATIENT_CLINIC_OR_DEPARTMENT_OTHER): Payer: Self-pay | Admitting: *Deleted

## 2016-05-23 ENCOUNTER — Emergency Department (HOSPITAL_BASED_OUTPATIENT_CLINIC_OR_DEPARTMENT_OTHER): Payer: Medicare Other

## 2016-05-23 ENCOUNTER — Emergency Department (HOSPITAL_BASED_OUTPATIENT_CLINIC_OR_DEPARTMENT_OTHER)
Admission: EM | Admit: 2016-05-23 | Discharge: 2016-05-23 | Disposition: A | Discharge status: Home / Self Care | Payer: Medicare Other | Attending: Emergency Medicine | Admitting: Emergency Medicine

## 2016-05-23 DIAGNOSIS — R079 Chest pain, unspecified: Secondary | ICD-10-CM | POA: Diagnosis not present

## 2016-05-23 DIAGNOSIS — R0981 Nasal congestion: Secondary | ICD-10-CM | POA: Insufficient documentation

## 2016-05-23 DIAGNOSIS — R0781 Pleurodynia: Secondary | ICD-10-CM | POA: Diagnosis not present

## 2016-05-23 DIAGNOSIS — I10 Essential (primary) hypertension: Secondary | ICD-10-CM | POA: Insufficient documentation

## 2016-05-23 DIAGNOSIS — Z79899 Other long term (current) drug therapy: Secondary | ICD-10-CM | POA: Insufficient documentation

## 2016-05-23 DIAGNOSIS — R0789 Other chest pain: Secondary | ICD-10-CM | POA: Diagnosis not present

## 2016-05-23 LAB — COMPREHENSIVE METABOLIC PANEL
ALT: 39 U/L (ref 14–54)
AST: 28 U/L (ref 15–41)
Albumin: 4.3 g/dL (ref 3.5–5.0)
Alkaline Phosphatase: 85 U/L (ref 38–126)
Anion gap: 10 (ref 5–15)
BUN: 15 mg/dL (ref 6–20)
CO2: 29 mmol/L (ref 22–32)
Calcium: 9.7 mg/dL (ref 8.9–10.3)
Chloride: 98 mmol/L — ABNORMAL LOW (ref 101–111)
Creatinine, Ser: 0.75 mg/dL (ref 0.44–1.00)
GFR calc Af Amer: >60 mL/min (ref >60)
GFR calc non Af Amer: >60 mL/min (ref >60)
Glucose, Bld: 115 mg/dL — ABNORMAL HIGH (ref 65–99)
Potassium: 2.9 mmol/L — ABNORMAL LOW (ref 3.5–5.1)
Sodium: 137 mmol/L (ref 135–145)
Total Bilirubin: 0.4 mg/dL (ref 0.3–1.2)
Total Protein: 7.8 g/dL (ref 6.5–8.1)

## 2016-05-23 LAB — CBC WITH DIFFERENTIAL/PLATELET
Basophils Absolute: 0 10*3/uL (ref 0.0–0.1)
Basophils Relative: 0 %
Eosinophils Absolute: 0.2 10*3/uL (ref 0.0–0.7)
Eosinophils Relative: 2 %
HCT: 41.4 % (ref 36.0–46.0)
Hemoglobin: 14.2 g/dL (ref 12.0–15.0)
Lymphocytes Relative: 31 %
Lymphs Abs: 2.1 10*3/uL (ref 0.7–4.0)
MCH: 30 pg (ref 26.0–34.0)
MCHC: 34.3 g/dL (ref 30.0–36.0)
MCV: 87.5 fL (ref 78.0–100.0)
Monocytes Absolute: 0.5 10*3/uL (ref 0.1–1.0)
Monocytes Relative: 7 %
Neutro Abs: 3.9 10*3/uL (ref 1.7–7.7)
Neutrophils Relative %: 60 %
Platelets: 437 10*3/uL — ABNORMAL HIGH (ref 150–400)
RBC: 4.73 MIL/uL (ref 3.87–5.11)
RDW: 14.4 % (ref 11.5–15.5)
WBC: 6.6 10*3/uL (ref 4.0–10.5)

## 2016-05-23 LAB — HCG, SERUM, QUALITATIVE: Preg, Serum: NEGATIVE

## 2016-05-23 LAB — TROPONIN I: Troponin I: <0.03 ng/mL (ref <0.03)

## 2016-05-23 LAB — LIPASE, BLOOD: Lipase: 67 U/L — ABNORMAL HIGH (ref 11–51)

## 2016-05-23 MED ORDER — POTASSIUM CHLORIDE CRYS ER 20 MEQ PO TBCR
40.0000 meq | EXTENDED_RELEASE_TABLET | Freq: Once | ORAL | Status: AC
  Administered 2016-05-23: 40 meq via ORAL
  Filled 2016-05-23: qty 2

## 2016-05-23 MED ORDER — IOPAMIDOL (ISOVUE-370) INJECTION 76%
100.0000 mL | Freq: Once | INTRAVENOUS | Status: AC | PRN
  Administered 2016-05-23: 100 mL via INTRAVENOUS

## 2016-05-23 NOTE — ED Provider Notes (Signed)
CSN: [id number]     Arrival date & time 05/23/16  1220 History   First MD Initiated Contact with Patient 05/23/16 1328     Chief Complaint  Patient presents with  . Chest Pain     (Consider location/radiation/quality/duration/timing/severity/associated sxs/prior Treatment) HPI Comments: 55 year old female with history of urticaria, hypertension, osteoarthritis presents for left chest pain. The patient states that she has been sick over the last 2 weeks after having an infection at one of her broken teeth and a sinus infection. She reports that she did have a cough and shortness of breath but that this has resolved but she is continued to have sharp pain in the left side of her chest. Sneezing or sudden movement seemed to make the pain worse. Laying backwards or leaning forward does not seem to change the pain or causes him on. She denies fevers or chills. No leg swelling. She has had some intermittent distention of her abdomen although denies any at this time. Denies pain in her abdomen.   Past Medical History  Diagnosis Date  . Urticaria   . Essential hypertension, benign   . Primary osteoarthritis of both knees   . Menstrual migraine    Past Surgical History  Procedure Laterality Date  . Tubal ligation  12/26/1987   Family History  Problem Relation Age of Onset  . Hypertension Mother   . CAD Father     MI in his 38s   Social History  Substance Use Topics  . Smoking status: Former Smoker -- 1.50 packs/day    Types: Cigarettes    Quit date: 02/05/2006  . Smokeless tobacco: Never Used  . Alcohol Use: Yes     Comment: rarely   OB History    Gravida Para Term Preterm AB TAB SAB Ectopic Multiple Living   5 3 3  2 2    3      Review of Systems  Constitutional: Negative for fever, chills and fatigue.  HENT: Positive for congestion and dental problem. Negative for ear pain, rhinorrhea, sinus pressure, sneezing, sore throat and voice change.   Eyes: Negative for visual  disturbance.  Respiratory: Negative for cough, chest tightness, shortness of breath and wheezing.   Cardiovascular: Positive for chest pain. Negative for palpitations and leg swelling.  Gastrointestinal: Negative for nausea, vomiting, abdominal pain, diarrhea and constipation.  Endocrine: Negative for polyuria.  Genitourinary: Negative for dysuria, urgency and hematuria.  Musculoskeletal: Negative for myalgias and back pain.  Skin: Negative for rash.  Neurological: Negative for dizziness, weakness, light-headedness and headaches.  Hematological: Does not bruise/bleed easily.      Allergies  Aspirin; Azithromycin; Morphine and related; Augmentin; Clindamycin/lincomycin; Cortisporin; Erythromycin; Metronidazole; and Nsaids  Home Medications   Prior to Admission medications   Medication Sig Start Date End Date Taking? Authorizing Provider  albuterol (PROVENTIL HFA;VENTOLIN HFA) 108 (90 Base) MCG/ACT inhaler Inhale two puffs every 4-6 hours only as needed for shortness of breath or wheezing. 01/19/16 01/18/17  [name], MD  amoxicillin-clavulanate (AUGMENTIN) 875-125 MG tablet Take 1 tablet by mouth 2 (two) times daily. One po bid x 7 days 05/17/16   Noland Fordyce, PA-C  cetirizine (ZYRTEC) 1 MG/ML syrup TAKE 10 MLS (10 MG TOTAL) BY MOUTH DAILY. 02/04/16   [name], MD  chlorhexidine (PERIDEX) 0.12 % solution Use as directed 15 mLs in the mouth or throat 2 (two) times daily. 05/17/16   Noland Fordyce, PA-C  cyclobenzaprine (FLEXERIL) 10 MG tablet Take a half to a full  tab every 8-12 hours only as needed for muscle spasm, may cause sedation. 08/08/13   Sean Hommel, DO  ferrous sulfate 325 (65 FE) MG tablet Take 325 mg by mouth daily with breakfast.    Historical Provider, MD  fluconazole (DIFLUCAN) 150 MG tablet Take 1 tablet (150 mg total) by mouth once. 05/17/16   Noland Fordyce, PA-C  Garlic 123XX123 MG CAPS Take by mouth.    Historical Provider, MD  hydrOXYzine (ATARAX/VISTARIL) 10 MG tablet  Take 10 mg by mouth 3 (three) times daily as needed for itching.     Historical Provider, MD  ipratropium (ATROVENT) 0.06 % nasal spray Place 2 sprays into both nostrils 4 (four) times daily. 12/22/15   [name], MD  montelukast (SINGULAIR) 10 MG tablet Take 10 mg by mouth at bedtime.  04/04/13   Historical Provider, MD  neomycin-polymyxin-hydrocortisone (CORTISPORIN) otic solution Place 4 drops into both ears 4 (four) times daily. 05/05/16   [name], MD  pantoprazole (PROTONIX) 40 MG tablet TAKE 1 TABLET (40 MG TOTAL) BY MOUTH DAILY. 02/05/16   [name], MD  predniSONE (STERAPRED UNI-PAK 48 TAB) 5 MG (48) TBPK tablet 12 day dosepack po 05/05/16   [name], MD  triamcinolone cream (KENALOG) 0.1 % Apply 1 application topically 2 (two) times daily. Use for 2 to 4 days until improved 02/20/16   Kandra Nicolas, MD   BP 101/60 mmHg  Pulse 93  Temp(Src) 98.8 F (37.1 C) (Oral)  Resp 16  Ht 5\' 3"  (1.6 m)  Wt 191 lb (86.637 kg)  BMI 33.84 kg/m2  SpO2 99%  LMP 04/23/2016 Physical Exam  Constitutional: She is oriented to person, place, and time. She appears well-developed and well-nourished. No distress.  HENT:  Head: Normocephalic and atraumatic.  Right Ear: External ear normal.  Left Ear: External ear normal.  Nose: Nose normal.  Mouth/Throat: Oropharynx is clear and moist. No oropharyngeal exudate.  Eyes: EOM are normal. Pupils are equal, round, and reactive to light.  Neck: Normal range of motion. Neck supple.  Cardiovascular: Normal rate, regular rhythm, normal heart sounds and intact distal pulses.   No murmur heard. Pulmonary/Chest: Effort normal. No respiratory distress. She has no wheezes. She has no rales. Chest wall is not dull to percussion. She exhibits tenderness (enderness on examination in the areas depicted in red). She exhibits no mass, no crepitus and no edema. Right breast exhibits no mass, no nipple discharge and no skin change. Left breast exhibits no mass, no  nipple discharge and no skin change.    Abdominal: Soft. She exhibits no distension. There is no tenderness.  Musculoskeletal: Normal range of motion. She exhibits no edema or tenderness.  Neurological: She is alert and oriented to person, place, and time.  Skin: Skin is warm and dry. No rash noted. She is not diaphoretic.  Vitals reviewed.   ED Course  Procedures (including critical care time) Labs Review Labs Reviewed  CBC WITH DIFFERENTIAL/PLATELET - Abnormal; Notable for the following:    Platelets 437 (*)    All other components within normal limits  COMPREHENSIVE METABOLIC PANEL - Abnormal; Notable for the following:    Potassium 2.9 (*)    Chloride 98 (*)    Glucose, Bld 115 (*)    All other components within normal limits  LIPASE, BLOOD - Abnormal; Notable for the following:    Lipase 67 (*)    All other components within normal limits  TROPONIN I  HCG,  SERUM, QUALITATIVE    Imaging Review Dg Chest 2 View  05/23/2016  CLINICAL DATA:  55 year old female with left-sided chest pain for the past 2 weeks. EXAM: CHEST  2 VIEW COMPARISON:  Chest x-ray 03/10/2016. FINDINGS: Lung volumes are slightly low. No consolidative airspace disease. No pleural effusions. No pneumothorax. No pulmonary nodule or mass noted. Pulmonary vasculature and the cardiomediastinal silhouette are within normal limits. IMPRESSION: No radiographic evidence of acute cardiopulmonary disease. Electronically Signed   By: [name] M.D.   On: 05/23/2016 14:31   Ct Angio Chest Pe W/cm &/or Wo Cm  05/23/2016  CLINICAL DATA:  Two week history of pleuritic type left-sided chest pain EXAM: CT ANGIOGRAPHY CHEST WITH CONTRAST TECHNIQUE: Multidetector CT imaging of the chest was performed using the standard protocol during bolus administration of intravenous contrast. Multiplanar CT image reconstructions and MIPs were obtained to evaluate the vascular anatomy. CONTRAST:  100 mL Isovue 370 nonionic COMPARISON:   May 23, 2016 chest radiograph FINDINGS: Cardiovascular: There is no demonstrable pulmonary embolus. There is no thoracic aortic aneurysm or dissection. The visualized great vessels appear normal. The pericardium is not appreciably thickened. Mediastinum/Nodes: Thyroid appears normal. There is no appreciable thoracic adenopathy. Lungs/Pleura: There is patchy bibasilar atelectasis. There is no edema or consolidation. Upper Abdomen: There is evidence of hepatic steatosis. There is mild left adrenal hypertrophy. Note that the left adrenal is incompletely visualized. Visualized upper abdominal structures otherwise appear unremarkable. Musculoskeletal: There are no blastic or lytic bone lesions. Review of the MIP images confirms the above findings. IMPRESSION: No demonstrable pulmonary embolus. Patchy bibasilar atelectasis. No edema or consolidation. No adenopathy. Hepatic steatosis. Incomplete visualization of the left adrenal; left adrenal does appear mildly hypertrophied. Electronically Signed   By: Lowella Grip III M.D.   On: 05/23/2016 15:44   I have personally reviewed and evaluated these images and lab results as part of my medical decision-making.   EKG Interpretation   Date/Time:  Monday May 23 2016 12:31:28 EDT Ventricular Rate:  98 PR Interval:  142 QRS Duration: 90 QT Interval:  354 QTC Calculation: 451 R Axis:   60 Text Interpretation:  Normal sinus rhythm Nonspecific T wave abnormality  Abnormal ECG No significant change since last tracing Confirmed by NGUYEN,  EMILY ([id number]) on 05/23/2016 1:31:58 PM      MDM  Patient was seen and evaluated in stable condition. Patient well-appearing. Patient with areas of reproducible pain on examination. Symptoms of pleuritic chest pain. EKG unremarkable. Troponin normal. Lipase mildly elevated and mild hypokalemia which was supplemented but otherwise normal laboratory studies. CTA of the chest was negative for acute process. Upper abdominal  structures also appeared normal other than hepatic steatosis. Discussed all results with patient at bedside. She said she could not take anti-inflammatory medications. I discussed that this may be symptoms sort of pleurisy following her infection. It does not appear to be a pericarditis. Patient was discharged home in stable condition with instruction to follow-up with her primary care physician. Strict return precautions given. Final diagnoses:  Chest pain, unspecified chest pain type    1. Chest pain    Harvel Quale, MD 05/23/16 [id number]

## 2016-05-23 NOTE — ED Notes (Signed)
Patient room was noted to be empty when entering to provide D/C instructions.

## 2016-05-23 NOTE — Discharge Instructions (Signed)
You were seen and evaluated today for the pain in your left chest. The exact cause is unclear. Your lipase which is from your pancreas was mildly elevated and may be you had some pancreatitis over the last couple of weeks but it does not appear that you've pancreatitis at this time. Your pain could be from irritation of the lining of your chest after being ill with your sinusitis and cough. Please follow-up with your primary care physician.  Chest Pain  Chest pain can be caused by many different conditions. There is always a chance that your pain could be related to something serious, such as a heart attack or a blood clot in your lungs. Chest pain can also be caused by conditions that are not life-threatening. If you have chest pain, it is very important to follow up with your health care provider. CAUSES  Chest pain can be caused by:  Heartburn.  Pneumonia or bronchitis.  Anxiety or stress.  Inflammation around your heart (pericarditis) or lung (pleuritis or pleurisy).  A blood clot in your lung.  A collapsed lung (pneumothorax). It can develop suddenly on its own (spontaneous pneumothorax) or from trauma to the chest.  Shingles infection (varicella-zoster virus).  Heart attack.  Damage to the bones, muscles, and cartilage that make up your chest wall. This can include:  Bruised bones due to injury.  Strained muscles or cartilage due to frequent or repeated coughing or overwork.  Fracture to one or more ribs.  Sore cartilage due to inflammation (costochondritis). RISK FACTORS  Risk factors for chest pain may include:  Activities that increase your risk for trauma or injury to your chest.  Respiratory infections or conditions that cause frequent coughing.  Medical conditions or overeating that can cause heartburn.  Heart disease or family history of heart disease.  Conditions or health behaviors that increase your risk of developing a blood clot.  Having had chicken pox  (varicella zoster). SIGNS AND SYMPTOMS Chest pain can feel like:  Burning or tingling on the surface of your chest or deep in your chest.  Crushing, pressure, aching, or squeezing pain.  Dull or sharp pain that is worse when you move, cough, or take a deep breath.  Pain that is also felt in your back, neck, shoulder, or arm, or pain that spreads to any of these areas. Your chest pain may come and go, or it may stay constant. DIAGNOSIS Lab tests or other studies may be needed to find the cause of your pain. Your health care provider may have you take a test called an ambulatory ECG (electrocardiogram). An ECG records your heartbeat patterns at the time the test is performed. You may also have other tests, such as:  Transthoracic echocardiogram (TTE). During echocardiography, sound waves are used to create a picture of all of the heart structures and to look at how blood flows through your heart.  Transesophageal echocardiogram (TEE).This is a more advanced imaging test that obtains images from inside your body. It allows your health care provider to see your heart in finer detail.  Cardiac monitoring. This allows your health care provider to monitor your heart rate and rhythm in real time.  Holter monitor. This is a portable device that records your heartbeat and can help to diagnose abnormal heartbeats. It allows your health care provider to track your heart activity for several days, if needed.  Stress tests. These can be done through exercise or by taking medicine that makes your heart beat more quickly.  Blood tests.  Imaging tests. TREATMENT  Your treatment depends on what is causing your chest pain. Treatment may include:  Medicines. These may include:  Acid blockers for heartburn.  Anti-inflammatory medicine.  Pain medicine for inflammatory conditions.  Antibiotic medicine, if an infection is present.  Medicines to dissolve blood clots.  Medicines to treat coronary  artery disease.  Supportive care for conditions that do not require medicines. This may include:  Resting.  Applying heat or cold packs to injured areas.  Limiting activities until pain decreases. HOME CARE INSTRUCTIONS  If you were prescribed an antibiotic medicine, finish it all even if you start to feel better.  Avoid any activities that bring on chest pain.  Do not use any tobacco products, including cigarettes, chewing tobacco, or electronic cigarettes. If you need help quitting, ask your health care provider.  Do not drink alcohol.  Take medicines only as directed by your health care provider.  Keep all follow-up visits as directed by your health care provider. This is important. This includes any further testing if your chest pain does not go away.  If heartburn is the cause for your chest pain, you may be told to keep your head raised (elevated) while sleeping. This reduces the chance that acid will go from your stomach into your esophagus.  Make lifestyle changes as directed by your health care provider. These may include:  Getting regular exercise. Ask your health care provider to suggest some activities that are safe for you.  Eating a heart-healthy diet. A registered dietitian can help you to learn healthy eating options.  Maintaining a healthy weight.  Managing diabetes, if necessary.  Reducing stress. SEEK MEDICAL CARE IF:  Your chest pain does not go away after treatment.  You have a rash with blisters on your chest.  You have a fever. SEEK IMMEDIATE MEDICAL CARE IF:   Your chest pain is worse.  You have an increasing cough, or you cough up blood.  You have severe abdominal pain.  You have severe weakness.  You faint.  You have chills.  You have sudden, unexplained chest discomfort.  You have sudden, unexplained discomfort in your arms, back, neck, or jaw.  You have shortness of breath at any time.  You suddenly start to sweat, or your  skin gets clammy.  You feel nauseous or you vomit.  You suddenly feel light-headed or dizzy.  Your heart begins to beat quickly, or it feels like it is skipping beats. These symptoms may represent a serious problem that is an emergency. Do not wait to see if the symptoms will go away. Get medical help right away. Call your local emergency services (911 in the [place] Do not drive yourself to the hospital.   This information is not intended to replace advice given to you by your health care provider. Make sure you discuss any questions you have with your health care provider.   Document Released: 08/03/2005 Document Revised: 11/14/2014 Document Reviewed: 05/30/2014 Elsevier Interactive Patient Education 2016 [place] is an inflammation and swelling of the lining of the lungs (pleura). Because of this inflammation, it hurts to breathe. It can be aggravated by coughing, laughing, or deep breathing. Pleurisy is often caused by an underlying infection or disease.  HOME CARE INSTRUCTIONS  Monitor your pleurisy for any changes. The following actions may help to alleviate any discomfort you are experiencing:  Medicine may help with pain. Only take over-the-counter or prescription medicines for pain, discomfort, or  fever as directed by your health care provider.  Only take antibiotic medicine as directed. Make sure to finish it even if you start to feel better. SEEK MEDICAL CARE IF:   Your pain is not controlled with medicine or is increasing.  You have an increase in pus-like (purulent) secretions brought up with coughing. SEEK IMMEDIATE MEDICAL CARE IF:   You have blue or dark lips, fingernails, or toenails.  You are coughing up blood.  You have increased difficulty breathing.  You have continuing pain unrelieved by medicine or pain lasting more than 1 week.  You have pain that radiates into your neck, arms, or jaw.  You develop increased shortness of breath or  wheezing.  You develop a fever, rash, vomiting, fainting, or other serious symptoms. MAKE SURE YOU:  Understand these instructions.   Will watch your condition.   Will get help right away if you are not doing well or get worse.    This information is not intended to replace advice given to you by your health care provider. Make sure you discuss any questions you have with your health care provider.   Document Released: 10/24/2005 Document Revised: 06/26/2013 Document Reviewed: 04/07/2013 Elsevier Interactive Patient Education [employer].

## 2016-05-23 NOTE — ED Notes (Signed)
Patient transported to CT 

## 2016-05-23 NOTE — ED Notes (Signed)
Dizzy, lightheaded at registration. Refused wheel chair. Chest pain for 2 weeks that was affecting her whole system per pt. She has been on 2 antibiotics recently for gun infection. Pt has multiple complaints. Sharp pain over her body. She was seen at [facility] 6 days ago for the gum infection.

## 2016-05-24 ENCOUNTER — Encounter: Payer: Self-pay | Admitting: Family Medicine

## 2016-05-24 ENCOUNTER — Ambulatory Visit (INDEPENDENT_AMBULATORY_CARE_PROVIDER_SITE_OTHER): Payer: Medicare Other | Admitting: Family Medicine

## 2016-05-24 VITALS — BP 112/73 | HR 98 | Wt 190.0 lb

## 2016-05-24 DIAGNOSIS — R748 Abnormal levels of other serum enzymes: Secondary | ICD-10-CM | POA: Diagnosis not present

## 2016-05-24 DIAGNOSIS — R079 Chest pain, unspecified: Secondary | ICD-10-CM

## 2016-05-24 DIAGNOSIS — E876 Hypokalemia: Secondary | ICD-10-CM | POA: Diagnosis not present

## 2016-05-24 NOTE — Patient Instructions (Signed)
Thank you for coming in today. Get labs in about 1 week.  Return the day after you get labs to go over lab results.   Hypokalemia Hypokalemia means that the amount of potassium in the blood is lower than normal.Potassium is a chemical, called an electrolyte, that helps regulate the amount of fluid in the body. It also stimulates muscle contraction and helps nerves function properly.Most of the body's potassium is inside of cells, and only a very small amount is in the blood. Because the amount in the blood is so small, minor changes can be life-threatening. CAUSES  Antibiotics.  Diarrhea or vomiting.  Using laxatives too much, which can cause diarrhea.  Chronic kidney disease.  Water pills (diuretics).  Eating disorders (bulimia).  Low magnesium level.  Sweating a lot. SIGNS AND SYMPTOMS  Weakness.  Constipation.  Fatigue.  Muscle cramps.  Mental confusion.  Skipped heartbeats or irregular heartbeat (palpitations).  Tingling or numbness. DIAGNOSIS  Your health care provider can diagnose hypokalemia with blood tests. In addition to checking your potassium level, your health care provider may also check other lab tests. TREATMENT Hypokalemia can be treated with potassium supplements taken by mouth or adjustments in your current medicines. If your potassium level is very low, you may need to get potassium through a vein (IV) and be monitored in the hospital. A diet high in potassium is also helpful. Foods high in potassium are:  Nuts, such as peanuts and pistachios.  Seeds, such as sunflower seeds and pumpkin seeds.  Peas, lentils, and lima beans.  Whole grain and bran cereals and breads.  Fresh fruit and vegetables, such as apricots, avocado, bananas, cantaloupe, kiwi, oranges, tomatoes, asparagus, and potatoes.  Orange and tomato juices.  Red meats.  Fruit yogurt. HOME CARE INSTRUCTIONS  Take all medicines as prescribed by your health care  provider.  Maintain a healthy diet by including nutritious food, such as fruits, vegetables, nuts, whole grains, and lean meats.  If you are taking a laxative, be sure to follow the directions on the label. SEEK MEDICAL CARE IF:  Your weakness gets worse.  You feel your heart pounding or racing.  You are vomiting or having diarrhea.  You are diabetic and having trouble keeping your blood glucose in the normal range. SEEK IMMEDIATE MEDICAL CARE IF:  You have chest pain, shortness of breath, or dizziness.  You are vomiting or having diarrhea for more than 2 days.  You faint. MAKE SURE YOU:   Understand these instructions.  Will watch your condition.  Will get help right away if you are not doing well or get worse.   This information is not intended to replace advice given to you by your health care provider. Make sure you discuss any questions you have with your health care provider.   Document Released: 10/24/2005 Document Revised: 11/14/2014 Document Reviewed: 04/26/2013 Elsevier Interactive Patient Education 2016 [employer]  Acute Pancreatitis Acute pancreatitis is a disease in which the pancreas becomes suddenly inflamed. The pancreas is a large gland located behind your stomach. The pancreas produces enzymes that help digest food. The pancreas also releases the hormones glucagon and insulin that help regulate blood sugar. Damage to the pancreas occurs when the digestive enzymes from the pancreas are activated and begin attacking the pancreas before being released into the intestine. Most acute attacks last a couple of days and can cause serious complications. Some people become dehydrated and develop low blood pressure. In severe cases, bleeding into the pancreas can  lead to shock and can be life-threatening. The lungs, heart, and kidneys may fail. CAUSES  Pancreatitis can happen to anyone. In some cases, the cause is unknown. Most cases are caused by:  Alcohol  abuse.  Gallstones. Other less common causes are:  Certain medicines.  Exposure to certain chemicals.  Infection.  Damage caused by an accident (trauma).  Abdominal surgery. SYMPTOMS   Pain in the upper abdomen that may radiate to the back.  Tenderness and swelling of the abdomen.  Nausea and vomiting. DIAGNOSIS  Your caregiver will perform a physical exam. Blood and stool tests may be done to confirm the diagnosis. Imaging tests may also be done, such as X-rays, CT scans, or an ultrasound of the abdomen. TREATMENT  Treatment usually requires a stay in the hospital. Treatment may include:  Pain medicine.  Fluid replacement through an intravenous line (IV).  Placing a tube in the stomach to remove stomach contents and control vomiting.  Not eating for 3 or 4 days. This gives your pancreas a rest, because enzymes are not being produced that can cause further damage.  Antibiotic medicines if your condition is caused by an infection.  Surgery of the pancreas or gallbladder. HOME CARE INSTRUCTIONS   Follow the diet advised by your caregiver. This may involve avoiding alcohol and decreasing the amount of fat in your diet.  Eat smaller, more frequent meals. This reduces the amount of digestive juices the pancreas produces.  Drink enough fluids to keep your urine clear or pale yellow.  Only take over-the-counter or prescription medicines as directed by your caregiver.  Avoid drinking alcohol if it caused your condition.  Do not smoke.  Get plenty of rest.  Check your blood sugar at home as directed by your caregiver.  Keep all follow-up appointments as directed by your caregiver. SEEK MEDICAL CARE IF:   You do not recover as quickly as expected.  You develop new or worsening symptoms.  You have persistent pain, weakness, or nausea.  You recover and then have another episode of pain. SEEK IMMEDIATE MEDICAL CARE IF:   You are unable to eat or keep fluids  down.  Your pain becomes severe.  You have a fever or persistent symptoms for more than 2 to 3 days.  You have a fever and your symptoms suddenly get worse.  Your skin or the white part of your eyes turn yellow (jaundice).  You develop vomiting.  You feel dizzy, or you faint.  Your blood sugar is high (over 300 mg/dL). MAKE SURE YOU:   Understand these instructions.  Will watch your condition.  Will get help right away if you are not doing well or get worse.   This information is not intended to replace advice given to you by your health care provider. Make sure you discuss any questions you have with your health care provider.   Document Released: 10/24/2005 Document Revised: 04/24/2012 Document Reviewed: 02/02/2012 Elsevier Interactive Patient Education [employer].

## 2016-05-25 ENCOUNTER — Ambulatory Visit (INDEPENDENT_AMBULATORY_CARE_PROVIDER_SITE_OTHER): Payer: Medicare Other | Admitting: Sports Medicine

## 2016-05-25 ENCOUNTER — Telehealth: Payer: Self-pay | Admitting: Family Medicine

## 2016-05-25 DIAGNOSIS — T148 Other injury of unspecified body region: Secondary | ICD-10-CM | POA: Diagnosis not present

## 2016-05-25 DIAGNOSIS — W57XXXA Bitten or stung by nonvenomous insect and other nonvenomous arthropods, initial encounter: Secondary | ICD-10-CM | POA: Diagnosis not present

## 2016-05-25 MED ORDER — DOXYCYCLINE HYCLATE 100 MG PO TABS: 100.0000 mg | ORAL_TABLET | Freq: Two times a day (BID) | ORAL | Status: DC

## 2016-05-25 MED ORDER — AMOXICILLIN 500 MG PO CAPS: 500.0000 mg | ORAL_CAPSULE | Freq: Three times a day (TID) | ORAL | Status: DC

## 2016-05-25 NOTE — Telephone Encounter (Signed)
Pt saw Dr. Dianah Field today and was prescribed Doxycycline, she reports this causes her to break out in hives. Will route to PCP since treating Physician is out of office this afternoon. Pt does report she can take amoxicillin with no issues.

## 2016-05-25 NOTE — Progress Notes (Signed)
  Subjective:    CC: Hand pain  HPI: Earlier today this pleasant 55 year old female was gardening, she felt a stinging sensation on her left hypothenar eminence, with a warm sensation traveling up her arm, since then she really hasn't had any pain or other symptoms. No fevers, chills, full use of the hand, no numbness or tingling, no neck pain right now.  Past medical history, Surgical history, Family history not pertinant except as noted below, Social history, Allergies, and medications have been entered into the medical record, reviewed, and no changes needed.   Review of Systems: No fevers, chills, night sweats, weight loss, chest pain, or shortness of breath.   Objective:    General: Well Developed, well nourished, and in no acute distress.  Neuro: Alert and oriented x3, extra-ocular muscles intact, sensation grossly intact.  HEENT: Normocephalic, atraumatic, pupils equal round reactive to light, neck supple, no masses, no lymphadenopathy, thyroid nonpalpable.  Skin: Warm and dry, no rashes. Cardiac: Regular rate and rhythm, no murmurs rubs or gallops, no lower extremity edema.  Respiratory: Clear to auscultation bilaterally. Not using accessory muscles, speaking in full sentences. Left hand: Unremarkable to inspection, no visible entry sites, no erythema, induration, full range of motion and full strength, neurovascularly intact distally.  Impression and Recommendations:    I spent 25 minutes with this patient, greater than 50% was face-to-face time counseling regarding the above diagnoses

## 2016-05-25 NOTE — Telephone Encounter (Signed)
Switch to amoxicillin. This is likely to be less effective than doxycycline. Return as needed.

## 2016-05-25 NOTE — Progress Notes (Signed)
Janet Richards is a 55 y.o. female who presents to [facility]: [place] today for an emergency room visit. Patient was recently seen in the emergency department for an episode of chest pain. She had a thorough workup including troponins, EKG, and CT angiogram. The only significant abnormal lab result was mildly elevated serum lipase. Additionally she had low potassium levels. The potassium was repleted and she feels much better. She is essentially symptom-free. No fevers chills nausea vomiting or diarrhea.   Past Medical History  Diagnosis Date  . Urticaria   . Essential hypertension, benign   . Primary osteoarthritis of both knees   . Menstrual migraine    Past Surgical History  Procedure Laterality Date  . Tubal ligation  12/26/1987   Social History  Substance Use Topics  . Smoking status: Former Smoker -- 1.50 packs/day    Types: Cigarettes    Quit date: 02/05/2006  . Smokeless tobacco: Never Used  . Alcohol Use: Yes     Comment: rarely   family history includes CAD in her father; Hypertension in her mother.  ROS as above:  Medications: Current Outpatient Prescriptions  Medication Sig Dispense Refill  . albuterol (PROVENTIL HFA;VENTOLIN HFA) 108 (90 Base) MCG/ACT inhaler Inhale two puffs every 4-6 hours only as needed for shortness of breath or wheezing. 1 Inhaler 1  . cetirizine (ZYRTEC) 1 MG/ML syrup TAKE 10 MLS (10 MG TOTAL) BY MOUTH DAILY. 944 mL 6  . chlorhexidine (PERIDEX) 0.12 % solution Use as directed 15 mLs in the mouth or throat 2 (two) times daily. 120 mL 0  . cyclobenzaprine (FLEXERIL) 10 MG tablet Take a half to a full tab every 8-12 hours only as needed for muscle spasm, may cause sedation. 45 tablet 0  . ferrous sulfate 325 (65 FE) MG tablet Take 325 mg by mouth daily with breakfast.    . Garlic 123XX123 MG CAPS Take by mouth.    . hydrOXYzine  (ATARAX/VISTARIL) 10 MG tablet Take 10 mg by mouth 3 (three) times daily as needed for itching.     [name] ipratropium (ATROVENT) 0.06 % nasal spray Place 2 sprays into both nostrils 4 (four) times daily. 15 mL 1  . montelukast (SINGULAIR) 10 MG tablet Take 10 mg by mouth at bedtime.     [name] neomycin-polymyxin-hydrocortisone (CORTISPORIN) otic solution Place 4 drops into both ears 4 (four) times daily. 10 mL 1  . pantoprazole (PROTONIX) 40 MG tablet TAKE 1 TABLET (40 MG TOTAL) BY MOUTH DAILY. 90 tablet 1  . predniSONE (STERAPRED UNI-PAK 48 TAB) 5 MG (48) TBPK tablet 12 day dosepack po 48 tablet 2  . triamcinolone cream (KENALOG) 0.1 % Apply 1 application topically 2 (two) times daily. Use for 2 to 4 days until improved 15 g 0   No current facility-administered medications for this visit.   Allergies  Allergen Reactions  . Aspirin Rash    Patient indicates increases Urticaria.   . Azithromycin Nausea And Vomiting  . Morphine And Related Rash    Urticaria   . Augmentin [Amoxicillin-Pot Clavulanate] Diarrhea  . Clindamycin/Lincomycin     Facial swelling  . Cortisporin [Bacitra-Neomycin-Polymyxin-Hc]   . Erythromycin Nausea And Vomiting    Has taken azithromycin with no issues in the past.   . Metronidazole     Rash, has had fluconazole (Diflucan) w/o reaction  . Nsaids Swelling     Exam:  BP 112/73 mmHg  Pulse 98  Wt 190 lb (86.183 kg)  SpO2 97%  LMP 04/23/2016 Gen: Well NAD HEENT: EOMI,  MMM Lungs: Normal work of breathing. CTABL Heart: RRR no MRG Abd: NABS, Soft. Nondistended, Nontender Exts: Brisk capillary refill, warm and well perfused.     Chemistry      Component Value Date/Time   NA 137 05/23/2016 1410   K 2.9* 05/23/2016 1410   CL 98* 05/23/2016 1410   CO2 29 05/23/2016 1410   BUN 15 05/23/2016 1410   CREATININE 0.75 05/23/2016 1410   CREATININE 0.89 02/24/2016 1628      Component Value Date/Time   CALCIUM 9.7 05/23/2016 1410   ALKPHOS 85 05/23/2016 1410   AST  28 05/23/2016 1410   ALT 39 05/23/2016 1410   BILITOT 0.4 05/23/2016 1410      Lab Results  Component Value Date   LIPASE 67* 05/23/2016     No results found for this or any previous visit (from the past 24 hour(s)). Dg Chest 2 View  05/23/2016  CLINICAL DATA:  55 year old female with left-sided chest pain for the past 2 weeks. EXAM: CHEST  2 VIEW COMPARISON:  Chest x-ray 03/10/2016. FINDINGS: Lung volumes are slightly low. No consolidative airspace disease. No pleural effusions. No pneumothorax. No pulmonary nodule or mass noted. Pulmonary vasculature and the cardiomediastinal silhouette are within normal limits. IMPRESSION: No radiographic evidence of acute cardiopulmonary disease. Electronically Signed   By: [name] M.D.   On: 05/23/2016 14:31   Ct Angio Chest Pe W/cm &/or Wo Cm  05/23/2016  CLINICAL DATA:  Two week history of pleuritic type left-sided chest pain EXAM: CT ANGIOGRAPHY CHEST WITH CONTRAST TECHNIQUE: Multidetector CT imaging of the chest was performed using the standard protocol during bolus administration of intravenous contrast. Multiplanar CT image reconstructions and MIPs were obtained to evaluate the vascular anatomy. CONTRAST:  100 mL Isovue 370 nonionic COMPARISON:  May 23, 2016 chest radiograph FINDINGS: Cardiovascular: There is no demonstrable pulmonary embolus. There is no thoracic aortic aneurysm or dissection. The visualized great vessels appear normal. The pericardium is not appreciably thickened. Mediastinum/Nodes: Thyroid appears normal. There is no appreciable thoracic adenopathy. Lungs/Pleura: There is patchy bibasilar atelectasis. There is no edema or consolidation. Upper Abdomen: There is evidence of hepatic steatosis. There is mild left adrenal hypertrophy. Note that the left adrenal is incompletely visualized. Visualized upper abdominal structures otherwise appear unremarkable. Musculoskeletal: There are no blastic or lytic bone lesions. Review of the  MIP images confirms the above findings. IMPRESSION: No demonstrable pulmonary embolus. Patchy bibasilar atelectasis. No edema or consolidation. No adenopathy. Hepatic steatosis. Incomplete visualization of the left adrenal; left adrenal does appear mildly hypertrophied. Electronically Signed   By: Lowella Grip III M.D.   On: 05/23/2016 15:44      Assessment and Plan: 55 y.o. female with  Episode of chest pain. Unclear etiology. This may have been mild pancreatitis or somehow related to her low potassium.  Plan: Recheck lipase and potassium levels in one week and return to clinic after lab recheck. Additionally refer to cardiology for stress testing and further workup if needed.   Discussed warning signs or symptoms. Please see discharge instructions. Patient expresses understanding.

## 2016-05-25 NOTE — Telephone Encounter (Signed)
Pt advised of Rx change 

## 2016-05-25 NOTE — Patient Instructions (Signed)
Insect Bite Mosquitoes, flies, fleas, bedbugs, and many other insects can bite. Insect bites are different from insect stings. A sting is when poison (venom) is injected into the skin. Insect bites can cause pain or itching for a few days, but they are usually not serious. Some insects can spread diseases to people through a bite. SYMPTOMS  Symptoms of an insect bite include:  Itching or pain in the bite area.  Redness and swelling in the bite area.  An open wound (skin ulcer). In many cases, symptoms last for 2-4 days.  DIAGNOSIS  This condition is usually diagnosed based on symptoms and a physical exam. TREATMENT  Treatment is usually not needed for an insect bite. Symptoms often go away on their own. Your health care provider may recommend creams or lotions to help reduce itching. Antibiotic medicines may be prescribed if the bite becomes infected. A tetanus shot may be given in some cases. If you develop an allergic reaction to an insect bite, your health care provider will prescribe medicines to treat the reaction (antihistamines). This is rare. HOME CARE INSTRUCTIONS  Do not scratch the bite area.  Keep the bite area clean and dry. Wash the bite area daily with soap and water as told by your health care provider.  If directed, applyice to the bite area.  Put ice in a plastic bag.  Place a towel between your skin and the bag.  Leave the ice on for 20 minutes, 2-3 times per day.  To help reduce itching and swelling, try applying a baking soda paste, cortisone cream, or calamine lotion to the bite area as told by your health care provider.  Apply or take over-the-counter and prescription medicines only as told by your health care provider.  If you were prescribed an antibiotic medicine, use it as told by your health care provider. Do not stop using the antibiotic even if your condition improves.  Keep all follow-up visits as told by your health care provider. This is  important. PREVENTION   Use insect repellent. The best insect repellents contain:  DEET, picaridin, oil of lemon eucalyptus (OLE), or IR3535.  Higher amounts of an active ingredient.  When you are outdoors, wear clothing that covers your arms and legs.  Avoid opening windows that do not have window screens. SEEK MEDICAL CARE IF:  You have increased redness, swelling, or pain in the bite area.  You have a fever. SEEK IMMEDIATE MEDICAL CARE IF:   You have joint pain.   You have fluid, blood, or pus coming from the bite area.  You have a headache or neck pain.  You have unusual weakness.  You have a rash.  You have chest pain or shortness of breath.  You have abdominal pain, nausea, or vomiting.  You feel unusually tired or sleepy.   This information is not intended to replace advice given to you by your health care provider. Make sure you discuss any questions you have with your health care provider.   Document Released: 12/01/2004 Document Revised: 07/15/2015 Document Reviewed: 03/11/2015 Elsevier Interactive Patient Education 2016 [employer]  

## 2016-05-25 NOTE — Assessment & Plan Note (Signed)
Per patient on the left palm over the hyperthenar eminence, exam is completely benign. Adding a bit of doxycycline, she is up-to-date on tetanus. Return to see me as needed.

## 2016-05-28 ENCOUNTER — Other Ambulatory Visit: Payer: Self-pay | Admitting: Family Medicine

## 2016-05-31 ENCOUNTER — Ambulatory Visit: Payer: Self-pay | Admitting: Family Medicine

## 2016-05-31 NOTE — Telephone Encounter (Signed)
Pt left a VM on 05/26/2016 stating that she was mistaken when she advised that she was allergic to the abx. States that she mixed the rx up with one that she is allergic and wanted to make [place] aware.

## 2016-06-01 DIAGNOSIS — R079 Chest pain, unspecified: Secondary | ICD-10-CM | POA: Diagnosis not present

## 2016-06-01 DIAGNOSIS — R748 Abnormal levels of other serum enzymes: Secondary | ICD-10-CM | POA: Diagnosis not present

## 2016-06-01 DIAGNOSIS — E876 Hypokalemia: Secondary | ICD-10-CM | POA: Diagnosis not present

## 2016-06-01 LAB — CBC
HCT: 41.8 % (ref 35.0–45.0)
Hemoglobin: 14 g/dL (ref 11.7–15.5)
MCH: 29.5 pg (ref 27.0–33.0)
MCHC: 33.5 g/dL (ref 32.0–36.0)
MCV: 88.2 fL (ref 80.0–100.0)
MPV: 9.8 fL (ref 7.5–12.5)
Platelets: 452 10*3/uL — ABNORMAL HIGH (ref 140–400)
RBC: 4.74 MIL/uL (ref 3.80–5.10)
RDW: 14.9 % (ref 11.0–15.0)
WBC: 5.7 10*3/uL (ref 3.8–10.8)

## 2016-06-02 ENCOUNTER — Ambulatory Visit: Payer: Self-pay | Admitting: Family Medicine

## 2016-06-02 ENCOUNTER — Encounter: Payer: Self-pay | Admitting: Family Medicine

## 2016-06-02 ENCOUNTER — Ambulatory Visit (INDEPENDENT_AMBULATORY_CARE_PROVIDER_SITE_OTHER): Payer: Medicare Other | Admitting: Family Medicine

## 2016-06-02 VITALS — BP 100/68 | HR 98 | Wt 191.0 lb

## 2016-06-02 DIAGNOSIS — E876 Hypokalemia: Secondary | ICD-10-CM

## 2016-06-02 DIAGNOSIS — R748 Abnormal levels of other serum enzymes: Secondary | ICD-10-CM

## 2016-06-02 LAB — AMYLASE: Amylase: 38 U/L (ref 0–105)

## 2016-06-02 LAB — COMPREHENSIVE METABOLIC PANEL
ALT: 28 U/L (ref 6–29)
AST: 20 U/L (ref 10–35)
Albumin: 4.2 g/dL (ref 3.6–5.1)
Alkaline Phosphatase: 74 U/L (ref 33–130)
BUN: 12 mg/dL (ref 7–25)
CO2: 28 mmol/L (ref 20–31)
Calcium: 9.7 mg/dL (ref 8.6–10.4)
Chloride: 99 mmol/L (ref 98–110)
Creat: 1.05 mg/dL (ref 0.50–1.05)
Glucose, Bld: 118 mg/dL — ABNORMAL HIGH (ref 65–99)
Potassium: 3.9 mmol/L (ref 3.5–5.3)
Sodium: 138 mmol/L (ref 135–146)
Total Bilirubin: 0.4 mg/dL (ref 0.2–1.2)
Total Protein: 7.1 g/dL (ref 6.1–8.1)

## 2016-06-02 LAB — LIPASE: Lipase: 21 U/L (ref 7–60)

## 2016-06-02 LAB — MAGNESIUM: Magnesium: 2 mg/dL (ref 1.5–2.5)

## 2016-06-02 NOTE — Patient Instructions (Signed)
Thank you for coming in today.   Return as needed.    

## 2016-06-02 NOTE — Progress Notes (Signed)
Janet Richards is a 55 y.o. female who presents to [facility] today for labs. Patient presents to clinic today to follow-up recent laboratory findings. She was recently seen in the emergency department where she had hypokalemia and elevated lipase. She feels much better today and had repeat labs drawn yesterday. She is currently asymptomatic. No fevers or chills vomiting or diarrhea.   Past Medical History:  Diagnosis Date  . Essential hypertension, benign   . Menstrual migraine   . Primary osteoarthritis of both knees   . Urticaria    Past Surgical History:  Procedure Laterality Date  . TUBAL LIGATION  12/26/1987   Social History  Substance Use Topics  . Smoking status: Former Smoker    Packs/day: 1.50    Types: Cigarettes    Quit date: 02/05/2006  . Smokeless tobacco: Never Used  . Alcohol use Yes     Comment: rarely   family history includes CAD in her father; Hypertension in her mother.  ROS as above:  Medications: Current Outpatient Prescriptions  Medication Sig Dispense Refill  . albuterol (PROVENTIL HFA;VENTOLIN HFA) 108 (90 Base) MCG/ACT inhaler Inhale two puffs every 4-6 hours only as needed for shortness of breath or wheezing. 1 Inhaler 1  . cetirizine (ZYRTEC) 1 MG/ML syrup TAKE 10 MLS (10 MG TOTAL) BY MOUTH DAILY. 944 mL 6  . chlorhexidine (PERIDEX) 0.12 % solution Use as directed 15 mLs in the mouth or throat 2 (two) times daily. 120 mL 0  . cyclobenzaprine (FLEXERIL) 10 MG tablet Take a half to a full tab every 8-12 hours only as needed for muscle spasm, may cause sedation. 45 tablet 0  . ferrous sulfate 325 (65 FE) MG tablet Take 325 mg by mouth daily with breakfast.    . Garlic 123XX123 MG CAPS Take by mouth.    . hydrOXYzine (ATARAX/VISTARIL) 10 MG tablet Take 10 mg by mouth 3 (three) times daily as needed for itching.     [name] ipratropium  (ATROVENT) 0.06 % nasal spray Place 2 sprays into both nostrils 4 (four) times daily. 15 mL 1  . montelukast (SINGULAIR) 10 MG tablet Take 10 mg by mouth at bedtime.     [name] neomycin-polymyxin-hydrocortisone (CORTISPORIN) otic solution Place 4 drops into both ears 4 (four) times daily. 10 mL 1  . pantoprazole (PROTONIX) 40 MG tablet TAKE 1 TABLET (40 MG TOTAL) BY MOUTH DAILY. 90 tablet 1  . triamcinolone cream (KENALOG) 0.1 % Apply 1 application topically 2 (two) times daily. Use for 2 to 4 days until improved 15 g 0  . predniSONE (STERAPRED UNI-PAK 48 TAB) 5 MG (48) TBPK tablet 12 day dosepack po 48 tablet 2   No current facility-administered medications for this visit.    Allergies  Allergen Reactions  . Aspirin Rash    Patient indicates increases Urticaria.   . Azithromycin Nausea And Vomiting  . Morphine And Related Rash    Urticaria   . Augmentin [Amoxicillin-Pot Clavulanate] Diarrhea  . Clindamycin/Lincomycin     Facial swelling  . Cortisporin [Bacitra-Neomycin-Polymyxin-Hc]   . Erythromycin Nausea And Vomiting    Has taken azithromycin with no issues in the past.   . Metronidazole     Rash, has had fluconazole (Diflucan) w/o reaction  . Nsaids Swelling     Exam:  BP 100/68   Pulse 98   Wt 191 lb (86.6 kg)   LMP 04/23/2016   BMI 33.83 kg/m  Gen: Well NAD HEENT: EOMI,  MMM Lungs: Normal work of breathing. CTABL Heart: RRR no MRG Abd: NABS, Soft. Nondistended, Nontender Exts: Brisk capillary refill, warm and well perfused.     Chemistry      Component Value Date/Time   NA 138 06/01/2016 0936   K 3.9 06/01/2016 0936   CL 99 06/01/2016 0936   CO2 28 06/01/2016 0936   BUN 12 06/01/2016 0936   CREATININE 1.05 06/01/2016 0936      Component Value Date/Time   CALCIUM 9.7 06/01/2016 0936   ALKPHOS 74 06/01/2016 0936   AST 20 06/01/2016 0936   ALT 28 06/01/2016 0936   BILITOT 0.4 06/01/2016 0936     Lab Results  Component Value Date   LIPASE 21 06/01/2016      No results found for this or any previous visit (from the past 24 hour(s)). No results found.    Assessment and Plan: 55 y.o. female with resolved hypokalemia and elevated lipase. Plan for watchful waiting and return as needed.   No orders of the defined types were placed in this encounter.   Discussed warning signs or symptoms. Please see discharge instructions. Patient expresses understanding.

## 2016-06-17 ENCOUNTER — Ambulatory Visit (INDEPENDENT_AMBULATORY_CARE_PROVIDER_SITE_OTHER): Payer: Medicare Other | Admitting: Sports Medicine

## 2016-06-17 DIAGNOSIS — K047 Periapical abscess without sinus: Secondary | ICD-10-CM

## 2016-06-17 DIAGNOSIS — K0530 Chronic periodontitis, unspecified: Secondary | ICD-10-CM | POA: Diagnosis not present

## 2016-06-17 MED ORDER — FLUCONAZOLE 150 MG PO TABS: 150.0000 mg | ORAL_TABLET | Freq: Once | ORAL | 0 refills | Status: DC

## 2016-06-17 MED ORDER — AMOXICILLIN-POT CLAVULANATE 875-125 MG PO TABS: 1.0000 | ORAL_TABLET | Freq: Two times a day (BID) | ORAL | 0 refills | Status: AC

## 2016-06-17 NOTE — Progress Notes (Signed)
  Subjective:    CC: Mouth pain  HPI: This is a pleasant 55 year old female with poor dentition, she has known necrotic teeth, and has an appointment coming up with her dentist, unfortunately she is having pain in her left mandibular molars. Moderate, persistent without radiation.  Past medical history, Surgical history, Family history not pertinant except as noted below, Social history, Allergies, and medications have been entered into the medical record, reviewed, and no changes needed.   Review of Systems: No fevers, chills, night sweats, weight loss, chest pain, or shortness of breath.   Objective:    General: Well Developed, well nourished, and in no acute distress.  Neuro: Alert and oriented x3, extra-ocular muscles intact, sensation grossly intact.  HEENT: Normocephalic, atraumatic, pupils equal round reactive to light, neck supple, no masses, no lymphadenopathy, thyroid nonpalpable. Visible necrosis around the left middle mandibular molar. Tenderness to palpation around this tooth. Skin: Warm and dry, no rashes. Cardiac: Regular rate and rhythm, no murmurs rubs or gallops, no lower extremity edema.  Respiratory: Clear to auscultation bilaterally. Not using accessory muscles, speaking in full sentences.  Impression and Recommendations:    Chronic dental infection Painful and necrotic left middle mandibular molar. She does have an appointment coming up with her dentist, Augmentin for 10 days. Return as needed.  I spent 25 minutes with this patient, greater than 50% was face-to-face time counseling regarding the above diagnoses

## 2016-06-17 NOTE — Assessment & Plan Note (Signed)
Painful and necrotic left middle mandibular molar. She does have an appointment coming up with her dentist, Augmentin for 10 days. Return as needed.

## 2016-06-30 ENCOUNTER — Encounter: Payer: Self-pay | Admitting: Family Medicine

## 2016-07-04 ENCOUNTER — Other Ambulatory Visit: Payer: Self-pay | Admitting: Family Medicine

## 2016-07-04 ENCOUNTER — Ambulatory Visit (INDEPENDENT_AMBULATORY_CARE_PROVIDER_SITE_OTHER): Payer: Medicare Other | Admitting: Family Medicine

## 2016-07-04 VITALS — BP 136/77 | HR 78 | Temp 98.5°F | Resp 18 | Wt 189.1 lb

## 2016-07-04 DIAGNOSIS — K0530 Chronic periodontitis, unspecified: Secondary | ICD-10-CM

## 2016-07-04 DIAGNOSIS — K047 Periapical abscess without sinus: Secondary | ICD-10-CM

## 2016-07-04 DIAGNOSIS — Z1231 Encounter for screening mammogram for malignant neoplasm of breast: Secondary | ICD-10-CM

## 2016-07-04 MED ORDER — CIPROFLOXACIN HCL 500 MG PO TABS: 500.0000 mg | ORAL_TABLET | Freq: Two times a day (BID) | ORAL | 3 refills | Status: DC

## 2016-07-04 NOTE — Progress Notes (Signed)
Janet Richards is a 55 y.o. female who presents to [facility] today for recurrent dental infection. Patient has new onset of left lower dental pain and swelling occurring over the last few days. Symptoms are consistent with previous episodes of dental infection. No fevers chills nausea vomiting or diarrhea. She feels well otherwise   Past Medical History:  Diagnosis Date  . Essential hypertension, benign   . Menstrual migraine   . Primary osteoarthritis of both knees   . Urticaria    Past Surgical History:  Procedure Laterality Date  . TUBAL LIGATION  12/26/1987   Social History  Substance Use Topics  . Smoking status: Former Smoker    Packs/day: 1.50    Types: Cigarettes    Quit date: 02/05/2006  . Smokeless tobacco: Never Used  . Alcohol use Yes     Comment: rarely   family history includes CAD in her father; Hypertension in her mother.  ROS as above:  Medications: Current Outpatient Prescriptions  Medication Sig Dispense Refill  . albuterol (PROVENTIL HFA;VENTOLIN HFA) 108 (90 Base) MCG/ACT inhaler Inhale two puffs every 4-6 hours only as needed for shortness of breath or wheezing. 1 Inhaler 1  . cetirizine (ZYRTEC) 1 MG/ML syrup TAKE 10 MLS (10 MG TOTAL) BY MOUTH DAILY. 944 mL 6  . chlorhexidine (PERIDEX) 0.12 % solution Use as directed 15 mLs in the mouth or throat 2 (two) times daily. 120 mL 0  . ciprofloxacin (CIPRO) 500 MG tablet Take 1 tablet (500 mg total) by mouth 2 (two) times daily. 14 tablet 3  . cyclobenzaprine (FLEXERIL) 10 MG tablet Take a half to a full tab every 8-12 hours only as needed for muscle spasm, may cause sedation. 45 tablet 0  . ferrous sulfate 325 (65 FE) MG tablet Take 325 mg by mouth daily with breakfast.    . Garlic 123XX123 MG CAPS Take by mouth.    . hydrOXYzine (ATARAX/VISTARIL) 10 MG tablet Take 10 mg by mouth 3 (three)  times daily as needed for itching.     [name] ipratropium (ATROVENT) 0.06 % nasal spray Place 2 sprays into both nostrils 4 (four) times daily. 15 mL 1  . montelukast (SINGULAIR) 10 MG tablet Take 10 mg by mouth at bedtime.     [name] neomycin-polymyxin-hydrocortisone (CORTISPORIN) otic solution Place 4 drops into both ears 4 (four) times daily. 10 mL 1  . pantoprazole (PROTONIX) 40 MG tablet TAKE 1 TABLET (40 MG TOTAL) BY MOUTH DAILY. 90 tablet 1  . predniSONE (STERAPRED UNI-PAK 48 TAB) 5 MG (48) TBPK tablet 12 day dosepack po 48 tablet 2  . triamcinolone cream (KENALOG) 0.1 % Apply 1 application topically 2 (two) times daily. Use for 2 to 4 days until improved 15 g 0   No current facility-administered medications for this visit.    Allergies  Allergen Reactions  . Aspirin Rash    Patient indicates increases Urticaria.   . Azithromycin Nausea And Vomiting  . Morphine And Related Rash    Urticaria   . Augmentin [Amoxicillin-Pot Clavulanate] Diarrhea  . Clindamycin/Lincomycin     Facial swelling  . Cortisporin [Bacitra-Neomycin-Polymyxin-Hc]   . Erythromycin Nausea And Vomiting    Has taken azithromycin with no issues in the past.   . Metronidazole     Rash, has had fluconazole (Diflucan) w/o reaction  . Nsaids Swelling     Exam:  BP 136/77 (BP Location: Right Arm, Patient Position:  Sitting, Cuff Size: Normal)   Pulse 78   Temp 98.5 F (36.9 C) (Oral)   Resp 18   Wt 189 lb 1.3 oz (85.8 kg)   SpO2 99%   BMI 33.49 kg/m  Gen: Well NAD HEENT: EOMI,  MMM Tender and swollen erythematous left lower gum with dental caries throughout. No visible abscess noted. No significant cervical or submandibular lymphadenopathy. Lungs: Normal work of breathing. CTABL Heart: RRR no MRG Abd: NABS, Soft. Nondistended, Nontender Exts: Brisk capillary refill, warm and well perfused.   No results found for this or any previous visit (from the past 24 hour(s)). No results found.    Assessment and  Plan: 55 y.o. female with dental infection. Treat with Cipro as patient has allergies to many other antibiotics and this has worked well in the past. Return sooner if needed. Chronic problem exacerbated.   No orders of the defined types were placed in this encounter.   Discussed warning signs or symptoms. Please see discharge instructions. Patient expresses understanding.

## 2016-07-04 NOTE — Patient Instructions (Signed)
Thank you for coming in today. Start Cipro.  Get those teeth pulled  Return as needed.    Dental Abscess A dental abscess is pus in or around a tooth. HOME CARE  Take medicines only as told by your dentist.  If you were prescribed antibiotic medicine, finish all of it even if you start to feel better.  Rinse your mouth (gargle) often with salt water.  Do not drive or use heavy machinery, like a lawn mower, while taking pain medicine.  Do not apply heat to the outside of your mouth.  Keep all follow-up visits as told by your dentist. This is important. GET HELP IF:  Your pain is worse, and medicine does not help. GET HELP RIGHT AWAY IF:  You have a fever or chills.  Your symptoms suddenly get worse.  You have a very bad headache.  You have problems breathing or swallowing.  You have trouble opening your mouth.  You have puffiness (swelling) in your neck or around your eye.   This information is not intended to replace advice given to you by your health care provider. Make sure you discuss any questions you have with your health care provider.   Document Released: 03/10/2015 Document Reviewed: 03/10/2015 Elsevier Interactive Patient Education [employer].

## 2016-07-04 NOTE — Progress Notes (Signed)
Pt here has been having dental problems and stated that it feels like it is settling in her chest.

## 2016-07-07 ENCOUNTER — Other Ambulatory Visit: Payer: Self-pay | Admitting: Family Medicine

## 2016-07-07 DIAGNOSIS — Z1159 Encounter for screening for other viral diseases: Secondary | ICD-10-CM

## 2016-07-08 ENCOUNTER — Telehealth: Payer: Self-pay | Admitting: Family Medicine

## 2016-07-08 MED ORDER — PREDNISONE 5 MG (21) PO TBPK: 5.0000 mg | ORAL_TABLET | Freq: Every day | ORAL | 2 refills | Status: DC

## 2016-07-08 NOTE — Telephone Encounter (Signed)
Pt advised.

## 2016-07-08 NOTE — Telephone Encounter (Signed)
Patient has a history of urticaria. Okay to prescribe prednisone. Medications sent to pharmacy.

## 2016-07-08 NOTE — Telephone Encounter (Signed)
Patient called clinic requesting a 21-day supply of Prednisone. States the short dose is not adequate. Will route to PCP for review.

## 2016-07-12 ENCOUNTER — Encounter: Payer: Self-pay | Admitting: Family Medicine

## 2016-07-12 ENCOUNTER — Ambulatory Visit (INDEPENDENT_AMBULATORY_CARE_PROVIDER_SITE_OTHER): Payer: Medicare Other | Admitting: Family Medicine

## 2016-07-12 VITALS — BP 118/73 | HR 90 | Temp 98.7°F | Resp 16 | Wt 185.7 lb

## 2016-07-12 DIAGNOSIS — L505 Cholinergic urticaria: Secondary | ICD-10-CM | POA: Diagnosis not present

## 2016-07-12 DIAGNOSIS — Z23 Encounter for immunization: Secondary | ICD-10-CM

## 2016-07-12 DIAGNOSIS — M67471 Ganglion, right ankle and foot: Secondary | ICD-10-CM | POA: Diagnosis not present

## 2016-07-12 DIAGNOSIS — R202 Paresthesia of skin: Secondary | ICD-10-CM | POA: Diagnosis not present

## 2016-07-12 HISTORY — DX: Ganglion, right ankle and foot: M67.471

## 2016-07-12 NOTE — Patient Instructions (Signed)
Thank you for coming in today. Lets keep an eye on the small bump on the  Ankle and on the foot tingling and burning.  Return in 1 month.  You should hear from rheumatology about urticaria soon.  Return sooner if needed.    Ganglion Cyst A ganglion cyst is a noncancerous, fluid-filled lump that occurs near joints or tendons. The ganglion cyst grows out of a joint or the lining of a tendon. It most often develops in the hand or wrist, but it can also develop in the shoulder, elbow, hip, knee, ankle, or foot. The round or oval ganglion cyst can be the size of a pea or larger than a grape. Increased activity may enlarge the size of the cyst because more fluid starts to build up.  CAUSES It is not known what causes a ganglion cyst to grow. However, it may be related to:  Inflammation or irritation around the joint.  An injury.  Repetitive movements or overuse.  Arthritis. RISK FACTORS Risk factors include:  Being a woman.  Being age 69-50. SIGNS AND SYMPTOMS Symptoms may include:   A lump. This most often appears on the hand or wrist, but it can occur in other areas of the body.  Tingling.  Pain.  Numbness.  Muscle weakness.  Weak grip.  Less movement in a joint. DIAGNOSIS Ganglion cysts are most often diagnosed based on a physical exam. Your health care provider will feel the lump and may shine a light alongside it. If it is a ganglion cyst, a light often shines through it. Your health care provider may order an X-ray, ultrasound, or MRI to rule out other conditions. TREATMENT Ganglion cysts usually go away on their own without treatment. If pain or other symptoms are involved, treatment may be needed. Treatment is also needed if the ganglion cyst limits your movement or if it gets infected. Treatment may include:  Wearing a brace or splint on your wrist or finger.  Taking anti-inflammatory medicine.  Draining fluid from the lump with a needle (aspiration).  Injecting  a steroid into the joint.  Surgery to remove the ganglion cyst. HOME CARE INSTRUCTIONS  Do not press on the ganglion cyst, poke it with a needle, or hit it.  Take medicines only as directed by your health care provider.  Wear your brace or splint as directed by your health care provider.  Watch your ganglion cyst for any changes.  Keep all follow-up visits as directed by your health care provider. This is important. SEEK MEDICAL CARE IF:  Your ganglion cyst becomes larger or more painful.  You have increased redness, red streaks, or swelling.  You have pus coming from the lump.  You have weakness or numbness in the affected area.  You have a fever or chills.   This information is not intended to replace advice given to you by your health care provider. Make sure you discuss any questions you have with your health care provider.   Document Released: 10/21/2000 Document Revised: 11/14/2014 Document Reviewed: 04/08/2014 Elsevier Interactive Patient Education [employer].

## 2016-07-12 NOTE — Progress Notes (Signed)
Pt here for right ankle pain

## 2016-07-12 NOTE — Progress Notes (Signed)
Janet Richards is a 54 y.o. female who presents to [facility]: [facility] today for discuss foot nodule, bilateral foot burning pain and chronic urticaria.   1) right lateral foot nodule present for a few days without significant pain. Patient notes a small firm bump on the right lateral foot and ankle. She denies any injury. She denies any significant pain. She feels well otherwise no fevers or chills.  2) foot burning. Patient has bilateral plantar foot burning and tingling. This is been ongoing for a few days without injury. She denies any pain radiating from her back weakness or numbness or loss of function.  3) chronic urticaria: Patient has a history of chronic recurrent urticaria. She's been seen by allergy/immunology in the past and takes the medication listed below including cetirizine daily. She uses intermittent frequent doses of prednisone to control her symptoms however she's been using a lot of prednisone recently. She notes currently she is a bit itchy however she just started taking prednisone and is already feeling better. She attributes her urticaria to dental infections.   Past Medical History:  Diagnosis Date  . Essential hypertension, benign   . Menstrual migraine   . Primary osteoarthritis of both knees   . Urticaria    Past Surgical History:  Procedure Laterality Date  . TUBAL LIGATION  12/26/1987   Social History  Substance Use Topics  . Smoking status: Former Smoker    Packs/day: 1.50    Types: Cigarettes    Quit date: 02/05/2006  . Smokeless tobacco: Never Used  . Alcohol use Yes     Comment: rarely   family history includes CAD in her father; Hypertension in her mother.  ROS as above:  Medications: Current Outpatient Prescriptions  Medication Sig Dispense Refill  . albuterol (PROVENTIL HFA;VENTOLIN HFA) 108 (90 Base) MCG/ACT inhaler Inhale  two puffs every 4-6 hours only as needed for shortness of breath or wheezing. 1 Inhaler 1  . cetirizine (ZYRTEC) 1 MG/ML syrup TAKE 10 MLS (10 MG TOTAL) BY MOUTH DAILY. 944 mL 6  . chlorhexidine (PERIDEX) 0.12 % solution Use as directed 15 mLs in the mouth or throat 2 (two) times daily. 120 mL 0  . ciprofloxacin (CIPRO) 500 MG tablet Take 1 tablet (500 mg total) by mouth 2 (two) times daily. 14 tablet 3  . cyclobenzaprine (FLEXERIL) 10 MG tablet Take a half to a full tab every 8-12 hours only as needed for muscle spasm, may cause sedation. 45 tablet 0  . ferrous sulfate 325 (65 FE) MG tablet Take 325 mg by mouth daily with breakfast.    . Garlic 123XX123 MG CAPS Take by mouth.    . hydrOXYzine (ATARAX/VISTARIL) 10 MG tablet Take 10 mg by mouth 3 (three) times daily as needed for itching.     [name] ipratropium (ATROVENT) 0.06 % nasal spray Place 2 sprays into both nostrils 4 (four) times daily. 15 mL 1  . montelukast (SINGULAIR) 10 MG tablet Take 10 mg by mouth at bedtime.     [name] neomycin-polymyxin-hydrocortisone (CORTISPORIN) otic solution Place 4 drops into both ears 4 (four) times daily. 10 mL 1  . pantoprazole (PROTONIX) 40 MG tablet TAKE 1 TABLET (40 MG TOTAL) BY MOUTH DAILY. 90 tablet 1  . predniSONE (STERAPRED UNI-PAK 21 TAB) 5 MG (21) TBPK tablet Take 1 tablet (5 mg total) by mouth daily. Prednisone Dosepak by mouth 21 tablet 2  . triamcinolone cream (KENALOG) 0.1 %  Apply 1 application topically 2 (two) times daily. Use for 2 to 4 days until improved 15 g 0   No current facility-administered medications for this visit.    Allergies  Allergen Reactions  . Aspirin Rash    Patient indicates increases Urticaria.   . Azithromycin Nausea And Vomiting  . Morphine And Related Rash    Urticaria   . Augmentin [Amoxicillin-Pot Clavulanate] Diarrhea  . Clindamycin/Lincomycin     Facial swelling  . Cortisporin [Bacitra-Neomycin-Polymyxin-Hc]   . Erythromycin Nausea And Vomiting    Has taken  azithromycin with no issues in the past.   . Metronidazole     Rash, has had fluconazole (Diflucan) w/o reaction  . Nsaids Swelling     Exam:  BP 118/73 (BP Location: Right Arm, Patient Position: Sitting, Cuff Size: Large)   Pulse 90   Temp 98.7 F (37.1 C) (Oral)   Resp 16   Wt 185 lb 11.2 oz (84.2 kg)   SpO2 100%   BMI 32.90 kg/m  Gen: Well NAD HEENT: EOMI,  MMM Lungs: Normal work of breathing. CTABL Heart: RRR no MRG Abd: NABS, Soft. Nondistended, Nontender Exts: Brisk capillary refill, warm and well perfused.  Right foot and ankle: Tiny palpable nodule just distal to the lateral malleolus near the course of the peroneal tendon. Nontender stable ligaments exam normal strength. Pulses capillary refill and sensation intact distally. Plantar feet are normal-appearing bilaterally with no rash. Sensation is intact. Skin: Multiple raised erythematous urticarial like lesions present across trunk and extremities.  Limited musculoskeletal ultrasound of the right lateral foot and ankle: Normal-appearing peroneal tendons without hypoechoic fluid within the tendon sheath. The ultrasound probe was placed overlying the palpable nodule. A small few millimeter hypoechoic cystic structure without significant Doppler flow was visualized consistent with ganglion cyst. Normal-appearing bony structures.  X-ray right ankle dated April 2017 reviewed normal.  No results found for this or any previous visit (from the past 24 hour(s)). No results found.    Assessment and Plan: 55 y.o. female with  1) right foot and ankle nodule: Very likely ganglion cyst. Plan for watchful waiting as it is tiny and not bothersome.  2) plantar feet paresthesias: Possibly peripheral neuropathy. Symptoms of ointment present for a few days. Plan for watchful waiting is still persistent we'll obtain a nerve conduction study.  3) chronic urticaria: Ongoing problem. Patient requires multiple doses of prednisone. I'm  concerned that these are becoming so frequent that she's at risk for chronic steroid side effects. Plan to refer to rheumatology for further possible management. She may benefit from disease modifying antirheumatic treatment.  4) flu vaccine given today prior to discharge.   Orders Placed This Encounter  Procedures  . Flu Vaccine QUAD 36+ mos PF IM (Fluarix & Fluzone Quad PF)  . Ambulatory referral to Rheumatology    Referral Priority:   Routine    Referral Type:   Consultation    Referral Reason:   Specialty Services Required    Requested Specialty:   Rheumatology    Number of Visits Requested:   1    Discussed warning signs or symptoms. Please see discharge instructions. Patient expresses understanding.

## 2016-07-13 ENCOUNTER — Encounter: Payer: Self-pay | Admitting: Family Medicine

## 2016-07-14 NOTE — Progress Notes (Signed)
HPI:  55 yo female for evaluation of chest pain. I saw her previously in August 2016 for chest pain which I thought was a typical periodSeen in ER 7/17 with chest pain. CTA showed no pulmonary embolus. Potassium 2.9. Troponin negative. Hemoglobin normal. Patient states she has had occasional chest pain in the substernal area without radiation. It is described as a dull sensation without associated symptoms. Last 30 seconds to 1 minute. Increases with lying on left side. Only occurs when she has a tooth infection. Improves after tooth is pulled. She does not have dyspnea on exertion, orthopnea, PND, pedal edema, exertional chest pain or syncope.  Current Outpatient Prescriptions  Medication Sig Dispense Refill  . albuterol (PROVENTIL HFA;VENTOLIN HFA) 108 (90 Base) MCG/ACT inhaler Inhale two puffs every 4-6 hours only as needed for shortness of breath or wheezing. 1 Inhaler 1  . cetirizine (ZYRTEC) 1 MG/ML syrup TAKE 10 MLS (10 MG TOTAL) BY MOUTH DAILY. 944 mL 6  . chlorhexidine (PERIDEX) 0.12 % solution Use as directed 15 mLs in the mouth or throat 2 (two) times daily. 120 mL 0  . ciprofloxacin (CIPRO) 500 MG tablet Take 1 tablet (500 mg total) by mouth 2 (two) times daily. 14 tablet 3  . cyclobenzaprine (FLEXERIL) 10 MG tablet Take a half to a full tab every 8-12 hours only as needed for muscle spasm, may cause sedation. 45 tablet 0  . ferrous sulfate 325 (65 FE) MG tablet Take 325 mg by mouth daily with breakfast.    . Garlic 123XX123 MG CAPS Take by mouth.    . hydrOXYzine (ATARAX/VISTARIL) 10 MG tablet Take 10 mg by mouth 3 (three) times daily as needed for itching.     [name] ipratropium (ATROVENT) 0.06 % nasal spray Place 2 sprays into both nostrils 4 (four) times daily. 15 mL 1  . montelukast (SINGULAIR) 10 MG tablet Take 10 mg by mouth at bedtime.     [name] neomycin-polymyxin-hydrocortisone (CORTISPORIN) otic solution Place 4 drops into both ears 4 (four) times daily. 10 mL 1  . pantoprazole  (PROTONIX) 40 MG tablet TAKE 1 TABLET (40 MG TOTAL) BY MOUTH DAILY. 90 tablet 1  . predniSONE (STERAPRED UNI-PAK 21 TAB) 5 MG (21) TBPK tablet Take 1 tablet (5 mg total) by mouth daily. Prednisone Dosepak by mouth 21 tablet 2  . triamcinolone cream (KENALOG) 0.1 % Apply 1 application topically 2 (two) times daily. Use for 2 to 4 days until improved 15 g 0   No current facility-administered medications for this visit.     Allergies  Allergen Reactions  . Aspirin Rash    Patient indicates increases Urticaria.   . Azithromycin Nausea And Vomiting  . Morphine And Related Rash    Urticaria   . Augmentin [Amoxicillin-Pot Clavulanate] Diarrhea  . Clindamycin/Lincomycin     Facial swelling  . Cortisporin [Bacitra-Neomycin-Polymyxin-Hc]   . Erythromycin Nausea And Vomiting    Has taken azithromycin with no issues in the past.   . Metronidazole     Rash, has had fluconazole (Diflucan) w/o reaction  . Nsaids Swelling    Past Medical History:  Diagnosis Date  . Essential hypertension, benign   . Menstrual migraine   . Primary osteoarthritis of both knees   . Urticaria     Past Surgical History:  Procedure Laterality Date  . TUBAL LIGATION  12/26/1987    Social History   Social History  . Marital status: Married    Spouse name: N/A  .  Number of children: 3  . Years of education: N/A   Occupational History  . homemaker    Social History Main Topics  . Smoking status: Former Smoker    Packs/day: 1.50    Types: Cigarettes    Quit date: 02/05/2006  . Smokeless tobacco: Never Used  . Alcohol use Yes     Comment: rarely  . Drug use: No  . Sexual activity: Yes    Partners: Male   Other Topics Concern  . Not on file   Social History Narrative  . No narrative on file    Family History  Problem Relation Age of Onset  . Hypertension Mother   . CAD Father     MI in his 47s    ROS: no fevers or chills, productive cough, hemoptysis, dysphasia, odynophagia, melena,  hematochezia, dysuria, hematuria, rash, seizure activity, orthopnea, PND, pedal edema, claudication. Remaining systems are negative.  Physical Exam:   There were no vitals taken for this visit.  General:  Well developed/well nourished in NAD Skin warm/dry Patient not depressed No peripheral clubbing Back-normal HEENT-normal/normal eyelids Neck supple/normal carotid upstroke bilaterally; no bruits; no JVD; no thyromegaly chest - CTA/ normal expansion CV - RRR/normal S1 and S2; no murmurs, rubs or gallops;  PMI nondisplaced Abdomen -NT/ND, no HSM, no mass, + bowel sounds, no bruit 2+ femoral pulses, no bruits Ext-no edema, chords, 2+ DP Neuro-grossly nonfocal  ECG 05/23/2016-sinus rhythm, nonspecific ST changes.  A/P  1 Chest pain-symptoms are extremely atypical. They only occur during a tooth infection and with lying on left side. She does not have exertional symptoms. I do not think this represents cardiac pain and we will not pursue further cardiac evaluation.  2 hypertension-blood pressure controlled. Continue present medications.  3 hyperlipidemia-management per primary care.  Kirk Ruths, MD

## 2016-07-18 ENCOUNTER — Ambulatory Visit (INDEPENDENT_AMBULATORY_CARE_PROVIDER_SITE_OTHER): Payer: Medicare Other | Admitting: Family Medicine

## 2016-07-18 ENCOUNTER — Encounter: Payer: Self-pay | Admitting: Family Medicine

## 2016-07-18 VITALS — BP 143/61 | HR 95 | Temp 98.4°F | Wt 187.0 lb

## 2016-07-18 DIAGNOSIS — H6121 Impacted cerumen, right ear: Secondary | ICD-10-CM | POA: Diagnosis not present

## 2016-07-18 NOTE — Progress Notes (Signed)
Janet Richards is a 55 y.o. female who presents to [place]: [place] today for ear pressure. Patient has a several day history of ear pressure bilaterally right worse than left. No fevers or chills nausea vomiting or diarrhea. Patient is currently taking both Cipro for dental infection and prednisone for chronic urticaria. She feels well otherwise.   Past Medical History:  Diagnosis Date  . Essential hypertension, benign   . Menstrual migraine   . Primary osteoarthritis of both knees   . Urticaria    Past Surgical History:  Procedure Laterality Date  . TUBAL LIGATION  12/26/1987   Social History  Substance Use Topics  . Smoking status: Former Smoker    Packs/day: 1.50    Types: Cigarettes    Quit date: 02/05/2006  . Smokeless tobacco: Never Used  . Alcohol use Yes     Comment: rarely   family history includes CAD in her father; Hypertension in her mother.  ROS as above:  Medications: Current Outpatient Prescriptions  Medication Sig Dispense Refill  . albuterol (PROVENTIL HFA;VENTOLIN HFA) 108 (90 Base) MCG/ACT inhaler Inhale two puffs every 4-6 hours only as needed for shortness of breath or wheezing. 1 Inhaler 1  . cetirizine (ZYRTEC) 1 MG/ML syrup TAKE 10 MLS (10 MG TOTAL) BY MOUTH DAILY. 944 mL 6  . chlorhexidine (PERIDEX) 0.12 % solution Use as directed 15 mLs in the mouth or throat 2 (two) times daily. 120 mL 0  . ciprofloxacin (CIPRO) 500 MG tablet Take 1 tablet (500 mg total) by mouth 2 (two) times daily. 14 tablet 3  . cyclobenzaprine (FLEXERIL) 10 MG tablet Take a half to a full tab every 8-12 hours only as needed for muscle spasm, may cause sedation. 45 tablet 0  . ferrous sulfate 325 (65 FE) MG tablet Take 325 mg by mouth daily with breakfast.    . Garlic 123XX123 MG CAPS Take by mouth.    . hydrOXYzine (ATARAX/VISTARIL) 10 MG tablet Take 10 mg by mouth 3  (three) times daily as needed for itching.     [name] ipratropium (ATROVENT) 0.06 % nasal spray Place 2 sprays into both nostrils 4 (four) times daily. 15 mL 1  . montelukast (SINGULAIR) 10 MG tablet Take 10 mg by mouth at bedtime.     [name] neomycin-polymyxin-hydrocortisone (CORTISPORIN) otic solution Place 4 drops into both ears 4 (four) times daily. 10 mL 1  . pantoprazole (PROTONIX) 40 MG tablet TAKE 1 TABLET (40 MG TOTAL) BY MOUTH DAILY. 90 tablet 1  . predniSONE (STERAPRED UNI-PAK 21 TAB) 5 MG (21) TBPK tablet Take 1 tablet (5 mg total) by mouth daily. Prednisone Dosepak by mouth 21 tablet 2  . triamcinolone cream (KENALOG) 0.1 % Apply 1 application topically 2 (two) times daily. Use for 2 to 4 days until improved 15 g 0   No current facility-administered medications for this visit.    Allergies  Allergen Reactions  . Aspirin Rash    Patient indicates increases Urticaria.   . Azithromycin Nausea And Vomiting  . Morphine And Related Rash    Urticaria   . Augmentin [Amoxicillin-Pot Clavulanate] Diarrhea  . Clindamycin/Lincomycin     Facial swelling  . Cortisporin [Bacitra-Neomycin-Polymyxin-Hc]   . Erythromycin Nausea And Vomiting    Has taken azithromycin with no issues in the past.   . Metronidazole     Rash, has had fluconazole (Diflucan) w/o reaction  . Nsaids Swelling  Exam:  BP (!) 143/61   Pulse 95   Temp 98.4 F (36.9 C) (Oral)   Wt 187 lb (84.8 kg)   SpO2 98%   BMI 33.13 kg/m   Gen: Well NAD HEENT: EOMI,  MMM Right tympanic membrane is occluded with cerumen after attempt at irrigation. Left is clear and normal appearing. Mastoids are nontender bilaterally and no pain with ear motion. Present bilaterally. Lungs: Normal work of breathing. CTABL Heart: RRR no MRG Abd: NABS, Soft. Nondistended, Nontender Exts: Brisk capillary refill, warm and well perfused.   No results found for this or any previous visit (from the past 24 hour(s)). No results  found.    Assessment and Plan: 55 y.o. female with right-sided ear pressure with cerumen impaction. Unable to relief with irrigation today. Patient was intolerant of manual cerumen removal with curette.  Plan for trial of Debrox and Cortisporin drops. Return if not improving.   No orders of the defined types were placed in this encounter.   Discussed warning signs or symptoms. Please see discharge instructions. Patient expresses understanding.

## 2016-07-18 NOTE — Patient Instructions (Signed)
Thank you for coming in today. Use the prescription ear drops as well as over the counter DEBROX drops.  If not better return later this week.   Cerumen Impaction The structures of the external ear canal secrete a waxy substance known as cerumen. Excess cerumen can build up in the ear canal, causing a condition known as cerumen impaction. Cerumen impaction can cause ear pain and disrupt the function of the ear. The rate of cerumen production differs for each individual. In certain individuals, the configuration of the ear canal may decrease his or her ability to naturally remove cerumen. CAUSES Cerumen impaction is caused by excessive cerumen production or buildup. RISK FACTORS  Frequent use of swabs to clean ears.  Having narrow ear canals.  Having eczema.  Being dehydrated. SIGNS AND SYMPTOMS  Diminished hearing.  Ear drainage.  Ear pain.  Ear itch. TREATMENT Treatment may involve:  Over-the-counter or prescription ear drops to soften the cerumen.  Removal of cerumen by a health care provider. This may be done with:  Irrigation with warm water. This is the most common method of removal.  Ear curettes and other instruments.  Surgery. This may be done in severe cases. HOME CARE INSTRUCTIONS  Take medicines only as directed by your health care provider.  Do not insert objects into the ear with the intent of cleaning the ear. PREVENTION  Do not insert objects into the ear, even with the intent of cleaning the ear. Removing cerumen as a part of normal hygiene is not necessary, and the use of swabs in the ear canal is not recommended.  Drink enough water to keep your urine clear or pale yellow.  Control your eczema if you have it. SEEK MEDICAL CARE IF:  You develop ear pain.  You develop bleeding from the ear.  The cerumen does not clear after you use ear drops as directed.   This information is not intended to replace advice given to you by your health care  provider. Make sure you discuss any questions you have with your health care provider.   Document Released: 12/01/2004 Document Revised: 11/14/2014 Document Reviewed: 06/10/2015 Elsevier Interactive Patient Education [employer].

## 2016-07-20 ENCOUNTER — Encounter: Payer: Self-pay | Admitting: Cardiology

## 2016-07-20 ENCOUNTER — Ambulatory Visit (INDEPENDENT_AMBULATORY_CARE_PROVIDER_SITE_OTHER): Payer: Medicare Other | Admitting: Cardiology

## 2016-07-20 VITALS — BP 114/76 | HR 102 | Ht 63.0 in | Wt 185.4 lb

## 2016-07-20 DIAGNOSIS — I10 Essential (primary) hypertension: Secondary | ICD-10-CM

## 2016-07-20 DIAGNOSIS — E785 Hyperlipidemia, unspecified: Secondary | ICD-10-CM

## 2016-07-20 DIAGNOSIS — R079 Chest pain, unspecified: Secondary | ICD-10-CM | POA: Diagnosis not present

## 2016-07-20 NOTE — Patient Instructions (Signed)
Your physician recommends that you schedule a follow-up appointment in: AS NEEDED  

## 2016-07-21 ENCOUNTER — Ambulatory Visit: Payer: Self-pay | Admitting: Family Medicine

## 2016-07-22 ENCOUNTER — Ambulatory Visit (INDEPENDENT_AMBULATORY_CARE_PROVIDER_SITE_OTHER): Payer: Medicare Other | Admitting: Family Medicine

## 2016-07-22 VITALS — BP 136/82 | HR 89 | Wt 186.0 lb

## 2016-07-22 DIAGNOSIS — K047 Periapical abscess without sinus: Secondary | ICD-10-CM

## 2016-07-22 DIAGNOSIS — K0530 Chronic periodontitis, unspecified: Secondary | ICD-10-CM

## 2016-07-22 MED ORDER — CIPROFLOXACIN HCL 500 MG PO TABS: 500.0000 mg | ORAL_TABLET | Freq: Two times a day (BID) | ORAL | 6 refills | Status: DC

## 2016-07-22 NOTE — Patient Instructions (Signed)
Thank you for coming in today. Use cipro as needed for dental infection. Get the teeth pulled.  Return as needed.

## 2016-07-22 NOTE — Progress Notes (Signed)
Janet Richards is a 55 y.o. female who presents to [facility] today for dental infection. Patient returns to clinic today complaining of dental infection. She notes worsening pain in her left lower mouth. She is a plan to have her tooth removed in the near future. No fevers or chills nausea vomiting or diarrhea.   Past Medical History:  Diagnosis Date  . Essential hypertension, benign   . Menstrual migraine   . Primary osteoarthritis of both knees   . Urticaria    Past Surgical History:  Procedure Laterality Date  . TUBAL LIGATION  12/26/1987   Social History  Substance Use Topics  . Smoking status: Former Smoker    Packs/day: 1.50    Types: Cigarettes    Quit date: 02/05/2006  . Smokeless tobacco: Never Used  . Alcohol use Yes     Comment: rarely   family history includes CAD in her father; Hypertension in her mother.  ROS as above:  Medications: Current Outpatient Prescriptions  Medication Sig Dispense Refill  . albuterol (PROVENTIL HFA;VENTOLIN HFA) 108 (90 Base) MCG/ACT inhaler Inhale two puffs every 4-6 hours only as needed for shortness of breath or wheezing. 1 Inhaler 1  . cetirizine (ZYRTEC) 1 MG/ML syrup TAKE 10 MLS (10 MG TOTAL) BY MOUTH DAILY. 944 mL 6  . ciprofloxacin (CIPRO) 500 MG tablet Take 1 tablet (500 mg total) by mouth 2 (two) times daily. 10 tablet 6  . cyclobenzaprine (FLEXERIL) 10 MG tablet Take a half to a full tab every 8-12 hours only as needed for muscle spasm, may cause sedation. 45 tablet 0  . Garlic 123XX123 MG CAPS Take by mouth.    . hydrOXYzine (ATARAX/VISTARIL) 10 MG tablet Take 10 mg by mouth 3 (three) times daily as needed for itching.     [name] ipratropium (ATROVENT) 0.06 % nasal spray Place 2 sprays into both nostrils 4 (four) times daily. 15 mL 1  . montelukast (SINGULAIR) 10 MG tablet Take 10 mg by mouth at bedtime.     .  pantoprazole (PROTONIX) 40 MG tablet TAKE 1 TABLET (40 MG TOTAL) BY MOUTH DAILY. 90 tablet 1   No current facility-administered medications for this visit.    Allergies  Allergen Reactions  . Aspirin Rash    Patient indicates increases Urticaria.   . Azithromycin Nausea And Vomiting  . Clindamycin/Lincomycin Hives    Facial swelling  . Erythromycin Nausea And Vomiting and Hives    Has taken azithromycin with no issues in the past.   . Metronidazole Hives    Rash, has had fluconazole (Diflucan) w/o reaction  . Morphine And Related Rash    Urticaria Urticaria   . Nsaids Swelling and Hives  . Penicillins Swelling and Other (See Comments)    Patient states does not work for her.  Patient states does not work for her.  She denies swelling or hives from penicillin. She states penicillin not effective for infections. Has taken cephalosporins without problems  . Sulfa Antibiotics Hives  . Augmentin [Amoxicillin-Pot Clavulanate] Diarrhea  . Cortisporin [Bacitra-Neomycin-Polymyxin-Hc]      Exam:  BP 136/82   Pulse 89   Wt 186 lb (84.4 kg)   BMI 32.95 kg/m  Gen: Well NAD HEENT: EOMI,  MMM Left lower rear molar with dental carry and mild gumline erythema. No visible abscess. Tender to touch. No cervical or submandibular lymphadenopathy present.   No results found for this or any  previous visit (from the past 24 hour(s)). No results found.    Assessment and Plan: 55 y.o. female with recurrent worsening dental infection. Treat with Cipro as this worked well in the past. Location manager. Return as needed in the near future.   No orders of the defined types were placed in this encounter.   Discussed warning signs or symptoms. Please see discharge instructions. Patient expresses understanding.

## 2016-07-25 ENCOUNTER — Other Ambulatory Visit: Payer: Self-pay | Admitting: Family Medicine

## 2016-07-25 DIAGNOSIS — L505 Cholinergic urticaria: Secondary | ICD-10-CM

## 2016-07-26 ENCOUNTER — Other Ambulatory Visit: Payer: Self-pay | Admitting: Family Medicine

## 2016-07-26 DIAGNOSIS — J4520 Mild intermittent asthma, uncomplicated: Secondary | ICD-10-CM

## 2016-07-29 ENCOUNTER — Telehealth: Payer: Self-pay | Admitting: Family Medicine

## 2016-07-29 ENCOUNTER — Other Ambulatory Visit: Payer: Self-pay

## 2016-07-29 DIAGNOSIS — K047 Periapical abscess without sinus: Secondary | ICD-10-CM

## 2016-07-29 MED ORDER — FLUCONAZOLE 150 MG PO TABS: 150.0000 mg | ORAL_TABLET | Freq: Once | ORAL | 2 refills | Status: DC

## 2016-07-29 NOTE — Telephone Encounter (Signed)
Patient called and request for you to call her back.Pt adv she left a vm yesterday but made a mistake and wanted to speak with you to adv of what she meant. Thanks

## 2016-07-29 NOTE — Telephone Encounter (Signed)
Called and discussed problem with pt. Pt stated that she was having yeast infection symptoms and too rx diflucan and was feeling better. Pt requested refills on diflucan. Refills sent.

## 2016-08-16 ENCOUNTER — Ambulatory Visit (INDEPENDENT_AMBULATORY_CARE_PROVIDER_SITE_OTHER): Payer: Medicare Other

## 2016-08-16 DIAGNOSIS — Z1231 Encounter for screening mammogram for malignant neoplasm of breast: Secondary | ICD-10-CM | POA: Diagnosis not present

## 2016-08-30 ENCOUNTER — Ambulatory Visit: Payer: Self-pay | Admitting: Sports Medicine

## 2016-09-02 ENCOUNTER — Ambulatory Visit (INDEPENDENT_AMBULATORY_CARE_PROVIDER_SITE_OTHER): Payer: Medicare Other | Admitting: Family Medicine

## 2016-09-02 ENCOUNTER — Encounter: Payer: Self-pay | Admitting: Family Medicine

## 2016-09-02 ENCOUNTER — Telehealth: Payer: Self-pay

## 2016-09-02 DIAGNOSIS — R208 Other disturbances of skin sensation: Secondary | ICD-10-CM | POA: Diagnosis not present

## 2016-09-02 DIAGNOSIS — R202 Paresthesia of skin: Secondary | ICD-10-CM

## 2016-09-02 DIAGNOSIS — R200 Anesthesia of skin: Secondary | ICD-10-CM

## 2016-09-02 HISTORY — DX: Anesthesia of skin: R20.0

## 2016-09-02 MED ORDER — LIDOCAINE HCL 3 % EX CREA: 1.0000 "application " | TOPICAL_CREAM | CUTANEOUS | 3 refills | Status: DC | PRN

## 2016-09-02 MED ORDER — LIDOCAINE 3.75 % EX CREA: 1.0000 "application " | TOPICAL_CREAM | Freq: Every evening | CUTANEOUS | 6 refills | Status: DC | PRN

## 2016-09-02 NOTE — Telephone Encounter (Signed)
Keslie called and states the cream Dr Georgina Snell sent in cost over a thousand dollars. She would like something cheaper. Please advise.

## 2016-09-02 NOTE — Patient Instructions (Signed)
Thank you for coming in today. Use the lidocaine cream at night as needed. Return in one month if not better.   Paresthesia Paresthesia is an abnormal burning or prickling sensation. This sensation is generally felt in the hands, arms, legs, or feet. However, it may occur in any part of the body. Usually, it is not painful. The feeling may be described as:  Tingling or numbness.  Pins and needles.  Skin crawling.  Buzzing.  Limbs falling asleep.  Itching. Most people experience temporary (transient) paresthesia at some time in their lives. Paresthesia may occur when you breathe too quickly (hyperventilation). It can also occur without any apparent cause. Commonly, paresthesia occurs when pressure is placed on a nerve. The sensation quickly goes away after the pressure is removed. For some people, however, paresthesia is a long-lasting (chronic) condition that is caused by an underlying disorder. If you continue to have paresthesia, you may need further medical evaluation. HOME CARE INSTRUCTIONS Watch your condition for any changes. Taking the following actions may help to lessen any discomfort that you are feeling:  Avoid drinking alcohol.  Try acupuncture or massage to help relieve your symptoms.  Keep all follow-up visits as directed by your health care provider. This is important. SEEK MEDICAL CARE IF:  You continue to have episodes of paresthesia.  Your burning or prickling feeling gets worse when you walk.  You have pain, cramps, or dizziness.  You develop a rash. SEEK IMMEDIATE MEDICAL CARE IF:  You feel weak.  You have trouble walking or moving.  You have problems with speech, understanding, or vision.  You feel confused.  You cannot control your bladder or bowel movements.  You have numbness after an injury.  You faint.   This information is not intended to replace advice given to you by your health care provider. Make sure you discuss any questions you  have with your health care provider.   Document Released: 10/14/2002 Document Revised: 03/10/2015 Document Reviewed: 10/20/2014 Elsevier Interactive Patient Education [employer].

## 2016-09-02 NOTE — Telephone Encounter (Signed)
Patient advised.

## 2016-09-02 NOTE — Telephone Encounter (Signed)
Cheaper medication sent in. I agree thousand dollars is completely outrageous.

## 2016-09-02 NOTE — Progress Notes (Signed)
Janet Richards is a 55 y.o. female who presents to [place]: [place] today for foot burning. Patient has a several week history of bilateral plantar foot burning. This occurs at night with sleep. She denies any injury fevers chills nausea vomiting diarrhea. She has not tried any treatment yet. She's never had anything like this happen before.   Past Medical History:  Diagnosis Date  . Essential hypertension, benign   . Menstrual migraine   . Primary osteoarthritis of both knees   . Urticaria    Past Surgical History:  Procedure Laterality Date  . TUBAL LIGATION  12/26/1987   Social History  Substance Use Topics  . Smoking status: Former Smoker    Packs/day: 1.50    Types: Cigarettes    Quit date: 02/05/2006  . Smokeless tobacco: Never Used  . Alcohol use Yes     Comment: rarely   family history includes CAD in her father; Hypertension in her mother.  ROS as above:  Medications: Current Outpatient Prescriptions  Medication Sig Dispense Refill  . albuterol (PROVENTIL HFA;VENTOLIN HFA) 108 (90 Base) MCG/ACT inhaler Inhale two puffs every 4-6 hours only as needed for shortness of breath or wheezing. 1 Inhaler 1  . cetirizine (ZYRTEC) 1 MG/ML syrup TAKE 10 MLS (10 MG TOTAL) BY MOUTH DAILY. 944 mL 6  . ciprofloxacin (CIPRO) 500 MG tablet Take 1 tablet (500 mg total) by mouth 2 (two) times daily. 10 tablet 6  . cyclobenzaprine (FLEXERIL) 10 MG tablet Take a half to a full tab every 8-12 hours only as needed for muscle spasm, may cause sedation. 45 tablet 0  . Garlic 123XX123 MG CAPS Take by mouth.    . hydrOXYzine (ATARAX/VISTARIL) 10 MG tablet Take 10 mg by mouth 3 (three) times daily as needed for itching.     [name] ipratropium (ATROVENT) 0.06 % nasal spray Place 2 sprays into both nostrils 4 (four) times daily. 15 mL 1  . montelukast (SINGULAIR) 10 MG tablet Take 10 mg by  mouth at bedtime.     . pantoprazole (PROTONIX) 40 MG tablet TAKE 1 TABLET (40 MG TOTAL) BY MOUTH DAILY. 90 tablet 1  . PROAIR HFA 108 (90 Base) MCG/ACT inhaler INHALE TWO PUFFS EVERY 4-6 HOURS ONLY AS NEEDED FOR SHORTNESS OF BREATH OR WHEEZING. 8.5 Inhaler 0  . lidocaine (LINDAMANTLE) 3 % CREA cream Apply 1 application topically as needed. 85 g 3   No current facility-administered medications for this visit.    Allergies  Allergen Reactions  . Aspirin Rash    Patient indicates increases Urticaria.   . Azithromycin Nausea And Vomiting  . Clindamycin/Lincomycin Hives    Facial swelling  . Erythromycin Nausea And Vomiting and Hives    Has taken azithromycin with no issues in the past.   . Metronidazole Hives    Rash, has had fluconazole (Diflucan) w/o reaction  . Morphine And Related Rash    Urticaria Urticaria   . Nsaids Swelling and Hives  . Penicillins Swelling and Other (See Comments)    Patient states does not work for her.  Patient states does not work for her.  She denies swelling or hives from penicillin. She states penicillin not effective for infections. Has taken cephalosporins without problems  . Sulfa Antibiotics Hives  . Augmentin [Amoxicillin-Pot Clavulanate] Diarrhea  . Cortisporin [Bacitra-Neomycin-Polymyxin-Hc]     Health Maintenance Health Maintenance  Topic Date Due  . Hepatitis C Screening  July 10, 1961  .  PAP SMEAR  07/23/2016  . MAMMOGRAM  08/16/2018  . TETANUS/TDAP  10/17/2024  . COLONOSCOPY  03/19/2025  . INFLUENZA VACCINE  Completed  . HIV Screening  Completed     Exam:  BP 128/69   Pulse 97   Temp 97.9 F (36.6 C) (Oral)   Wt 184 lb (83.5 kg)   SpO2 98%   BMI 32.59 kg/m  Gen: Well NAD HEENT: EOMI,  MMM Lungs: Normal work of breathing. CTABL Heart: RRR no MRG Abd: NABS, Soft. Nondistended, Nontender Exts: Brisk capillary refill, warm and well perfused.  Feet bilaterally are normal appearing with normal sensation pulses capillary refill  and motion. She has intact sensation to monofilament testing throughout. MSK: L spine is nontender normal lumbar motion. Negative straight leg raise test and slump test bilaterally.   No results found for this or any previous visit (from the past 72 hour(s)). No results found.    Assessment and Plan: 55 y.o. female with bilateral foot burning. Suspect this is paresthesias. However the complete differential diagnosis this time remains somewhat unclear. We'll start with watchful waiting along with lidocaine cream. If not improved will obtain a nerve conduction study. Recent laboratory workup has been unremarkable previously.   No orders of the defined types were placed in this encounter.   Discussed warning signs or symptoms. Please see discharge instructions. Patient expresses understanding.

## 2016-09-22 ENCOUNTER — Ambulatory Visit (INDEPENDENT_AMBULATORY_CARE_PROVIDER_SITE_OTHER): Payer: Medicare Other | Admitting: Family Medicine

## 2016-09-22 ENCOUNTER — Ambulatory Visit: Payer: Medicare Other | Admitting: Family Medicine

## 2016-09-22 VITALS — BP 103/74 | HR 98 | Wt 184.0 lb

## 2016-09-22 DIAGNOSIS — K047 Periapical abscess without sinus: Secondary | ICD-10-CM | POA: Diagnosis not present

## 2016-09-22 MED ORDER — CIPROFLOXACIN HCL 500 MG PO TABS: 500.0000 mg | ORAL_TABLET | Freq: Two times a day (BID) | ORAL | 2 refills | Status: DC

## 2016-09-22 NOTE — Patient Instructions (Signed)
Thank you for coming in today. Continue cipro.  Follow up with your surgeon.  Follow up with me Monday or Tuesday.  Call or go to the emergency room if you get worse, have trouble breathing, have chest pains, or palpitations.

## 2016-09-22 NOTE — Progress Notes (Signed)
Janet Richards is a 55 y.o. female who presents to [facility]: [place] today for jaw pain. Patient had a left molar removed 2 days ago via oral surgery. She was treated empirically with Norco and amoxicillin. She notes significant swelling redness and pain at the lateral left jaw. She has a follow-up appointment with her oral surgeon today. She denies any fevers or chills.   Past Medical History:  Diagnosis Date  . Essential hypertension, benign   . Menstrual migraine   . Primary osteoarthritis of both knees   . Urticaria    Past Surgical History:  Procedure Laterality Date  . TUBAL LIGATION  12/26/1987   Social History  Substance Use Topics  . Smoking status: Former Smoker    Packs/day: 1.50    Types: Cigarettes    Quit date: 02/05/2006  . Smokeless tobacco: Never Used  . Alcohol use Yes     Comment: rarely   family history includes CAD in her father; Hypertension in her mother.  ROS as above:  Medications: Current Outpatient Prescriptions  Medication Sig Dispense Refill  . albuterol (PROVENTIL HFA;VENTOLIN HFA) 108 (90 Base) MCG/ACT inhaler Inhale two puffs every 4-6 hours only as needed for shortness of breath or wheezing. 1 Inhaler 1  . cetirizine (ZYRTEC) 1 MG/ML syrup TAKE 10 MLS (10 MG TOTAL) BY MOUTH DAILY. 944 mL 6  . ciprofloxacin (CIPRO) 500 MG tablet Take 1 tablet (500 mg total) by mouth 2 (two) times daily. 20 tablet 2  . cyclobenzaprine (FLEXERIL) 10 MG tablet Take a half to a full tab every 8-12 hours only as needed for muscle spasm, may cause sedation. 45 tablet 0  . Garlic 123XX123 MG CAPS Take by mouth.    . hydrOXYzine (ATARAX/VISTARIL) 10 MG tablet Take 10 mg by mouth 3 (three) times daily as needed for itching.     [name] ipratropium (ATROVENT) 0.06 % nasal spray Place 2 sprays into both nostrils 4 (four) times daily. 15 mL 1  . lidocaine (LINDAMANTLE)  3 % CREA cream Apply 1 application topically as needed. 85 g 3  . montelukast (SINGULAIR) 10 MG tablet Take 10 mg by mouth at bedtime.     . pantoprazole (PROTONIX) 40 MG tablet TAKE 1 TABLET (40 MG TOTAL) BY MOUTH DAILY. 90 tablet 1  . PROAIR HFA 108 (90 Base) MCG/ACT inhaler INHALE TWO PUFFS EVERY 4-6 HOURS ONLY AS NEEDED FOR SHORTNESS OF BREATH OR WHEEZING. 8.5 Inhaler 0   No current facility-administered medications for this visit.    Allergies  Allergen Reactions  . Aspirin Rash    Patient indicates increases Urticaria.   . Azithromycin Nausea And Vomiting  . Clindamycin/Lincomycin Hives    Facial swelling  . Erythromycin Nausea And Vomiting and Hives    Has taken azithromycin with no issues in the past.   . Metronidazole Hives    Rash, has had fluconazole (Diflucan) w/o reaction  . Morphine And Related Rash    Urticaria Urticaria   . Nsaids Swelling and Hives  . Penicillins Swelling and Other (See Comments)    Patient states does not work for her.  Patient states does not work for her.  She denies swelling or hives from penicillin. She states penicillin not effective for infections. Has taken cephalosporins without problems  . Sulfa Antibiotics Hives  . Augmentin [Amoxicillin-Pot Clavulanate] Diarrhea  . Cortisporin [Bacitra-Neomycin-Polymyxin-Hc]     Health Maintenance Health Maintenance  Topic Date Due  .  Hepatitis C Screening  [birth year]  . PAP SMEAR  07/23/2016  . MAMMOGRAM  08/16/2018  . TETANUS/TDAP  10/17/2024  . COLONOSCOPY  03/19/2025  . INFLUENZA VACCINE  Completed  . HIV Screening  Completed     Exam:  BP 103/74   Pulse 98   Wt 184 lb (83.5 kg)   BMI 32.59 kg/m  Gen: Well NAD HEENT: EOMI,  MMM Left jaw as slightly swollen and mildly red. Tender to touch. No significant lymphadenopathy present Oral examination reveals a well-appearing oral socket with no discharge or pus Lungs: Normal work of breathing. CTABL Heart: RRR no MRG Abd: NABS, Soft.  Nondistended, Nontender Exts: Brisk capillary refill, warm and well perfused.    No results found for this or any previous visit (from the past 72 hour(s)). No results found.    Assessment and Plan: 55 y.o. female with jaw pain and swelling following tooth removal. Possibly infected. Treat empirically with Cipro and follow-up with oral surgeon.   No orders of the defined types were placed in this encounter.   Discussed warning signs or symptoms. Please see discharge instructions. Patient expresses understanding.

## 2016-09-26 ENCOUNTER — Encounter: Payer: Self-pay | Admitting: Family Medicine

## 2016-09-26 ENCOUNTER — Ambulatory Visit (INDEPENDENT_AMBULATORY_CARE_PROVIDER_SITE_OTHER): Payer: Medicare Other | Admitting: Family Medicine

## 2016-09-26 VITALS — BP 144/81 | HR 86 | Temp 98.6°F | Wt 182.0 lb

## 2016-09-26 DIAGNOSIS — R220 Localized swelling, mass and lump, head: Secondary | ICD-10-CM | POA: Diagnosis not present

## 2016-09-26 NOTE — Patient Instructions (Signed)
Thank you for coming in today. Return as needed.  Follow up with the oral surgeon.

## 2016-09-26 NOTE — Progress Notes (Signed)
[name]                                                                     Janet Richards is a 55 y.o. female who presents to [place]: [place] today for follow-up facial swelling. Patient was seen last week following dental extraction for left mandible swelling. She was treated with Cipro which has helped a bit. She still notes pain and swelling in the left jaw. She is reported with an oral surgeon just after today's appointment here. No fevers or chills.   Past Medical History:  Diagnosis Date  . Essential hypertension, benign   . Menstrual migraine   . Primary osteoarthritis of both knees   . Urticaria    Past Surgical History:  Procedure Laterality Date  . TUBAL LIGATION  12/26/1987   Social History  Substance Use Topics  . Smoking status: Former Smoker    Packs/day: 1.50    Types: Cigarettes    Quit date: 02/05/2006  . Smokeless tobacco: Never Used  . Alcohol use Yes     Comment: rarely   family history includes CAD in her father; Hypertension in her mother.  ROS as above:  Medications: Current Outpatient Prescriptions  Medication Sig Dispense Refill  . albuterol (PROVENTIL HFA;VENTOLIN HFA) 108 (90 Base) MCG/ACT inhaler Inhale two puffs every 4-6 hours only as needed for shortness of breath or wheezing. 1 Inhaler 1  . cetirizine (ZYRTEC) 1 MG/ML syrup TAKE 10 MLS (10 MG TOTAL) BY MOUTH DAILY. 944 mL 6  . ciprofloxacin (CIPRO) 500 MG tablet Take 1 tablet (500 mg total) by mouth 2 (two) times daily. 20 tablet 2  . cyclobenzaprine (FLEXERIL) 10 MG tablet Take a half to a full tab every 8-12 hours only as needed for muscle spasm, may cause sedation. 45 tablet 0  . Garlic 123XX123 MG CAPS Take by mouth.    . hydrOXYzine (ATARAX/VISTARIL) 10 MG tablet Take 10 mg by mouth 3 (three) times daily as needed for itching.     [name] ipratropium (ATROVENT) 0.06 % nasal spray Place 2 sprays into both nostrils 4 (four) times daily. 15 mL 1  .  lidocaine (LINDAMANTLE) 3 % CREA cream Apply 1 application topically as needed. 85 g 3  . montelukast (SINGULAIR) 10 MG tablet Take 10 mg by mouth at bedtime.     . pantoprazole (PROTONIX) 40 MG tablet TAKE 1 TABLET (40 MG TOTAL) BY MOUTH DAILY. 90 tablet 1  . PROAIR HFA 108 (90 Base) MCG/ACT inhaler INHALE TWO PUFFS EVERY 4-6 HOURS ONLY AS NEEDED FOR SHORTNESS OF BREATH OR WHEEZING. 8.5 Inhaler 0   No current facility-administered medications for this visit.    Allergies  Allergen Reactions  . Aspirin Rash    Patient indicates increases Urticaria.   . Azithromycin Nausea And Vomiting  . Clindamycin/Lincomycin Hives    Facial swelling  . Erythromycin Nausea And Vomiting and Hives    Has taken azithromycin with no issues in the past.   . Metronidazole Hives    Rash, has had fluconazole (Diflucan) w/o reaction  . Morphine And Related Rash    Urticaria Urticaria   . Nsaids Swelling and Hives  . Sulfa Antibiotics Hives  . Cortisporin [  Bacitra-Neomycin-Polymyxin-Hc] Rash    Health Maintenance Health Maintenance  Topic Date Due  . Hepatitis C Screening  10-18-1961  . PAP SMEAR  07/23/2016  . MAMMOGRAM  08/16/2018  . TETANUS/TDAP  10/17/2024  . COLONOSCOPY  03/19/2025  . INFLUENZA VACCINE  Completed  . HIV Screening  Completed     Exam:  BP (!) 144/81   Pulse 86   Temp 98.6 F (37 C) (Oral)   Wt 182 lb (82.6 kg)   BMI 32.24 kg/m  Gen: Well NAD HEENT: EOMI,  MMM No oral abscess visible. Tender firm nodule left mandible present. No cervical lymphadenopathy present. Normal swallow.   No results found for this or any previous visit (from the past 72 hour(s)). No results found.    Assessment and Plan: 55 y.o. female with jaw pain and swelling following dental extraction concern for abscess. Recommend follow-up with oral surgeon. Return as needed.   No orders of the defined types were placed in this encounter.   Discussed warning signs or symptoms. Please see  discharge instructions. Patient expresses understanding.

## 2016-10-05 ENCOUNTER — Ambulatory Visit (INDEPENDENT_AMBULATORY_CARE_PROVIDER_SITE_OTHER): Payer: Medicare Other | Admitting: Family Medicine

## 2016-10-05 ENCOUNTER — Other Ambulatory Visit: Payer: Self-pay | Admitting: Family Medicine

## 2016-10-05 ENCOUNTER — Encounter: Payer: Self-pay | Admitting: Family Medicine

## 2016-10-05 VITALS — BP 123/59 | HR 88 | Wt 185.0 lb

## 2016-10-05 DIAGNOSIS — K047 Periapical abscess without sinus: Secondary | ICD-10-CM

## 2016-10-05 DIAGNOSIS — S39012A Strain of muscle, fascia and tendon of lower back, initial encounter: Secondary | ICD-10-CM

## 2016-10-05 DIAGNOSIS — R350 Frequency of micturition: Secondary | ICD-10-CM

## 2016-10-05 LAB — POCT URINALYSIS DIPSTICK
Bilirubin, UA: NEGATIVE
Glucose, UA: NEGATIVE
Ketones, UA: NEGATIVE
Leukocytes, UA: NEGATIVE
Nitrite, UA: NEGATIVE
Protein, UA: NEGATIVE
Spec Grav, UA: 1.025
Urobilinogen, UA: 0.2
pH, UA: 5.5

## 2016-10-05 NOTE — Progress Notes (Signed)
Janet Richards is a 55 y.o. female who presents to [facility] today for back pain. Patient has a several week history of low back pain radiating to the lateral hips bilaterally. She notes back pain worse however in the last few days. She denies any injury. She denies any pain radiating down her legs weakness or numbness bowel bladder dysfunction. She denies any fevers or chills nausea vomiting or diarrhea. She has not had much treatment yet.  Additionally patient notes urinary frequency urgency and dysuria. This is associated with back pain as noted above. This is been present for few days and she is worried she may have a urinary tract infection.   Past Medical History:  Diagnosis Date  . Essential hypertension, benign   . Menstrual migraine   . Primary osteoarthritis of both knees   . Urticaria    Past Surgical History:  Procedure Laterality Date  . TUBAL LIGATION  12/26/1987   Social History  Substance Use Topics  . Smoking status: Former Smoker    Packs/day: 1.50    Types: Cigarettes    Quit date: 02/05/2006  . Smokeless tobacco: Never Used  . Alcohol use Yes     Comment: rarely     ROS:  As above   Medications: Current Outpatient Prescriptions  Medication Sig Dispense Refill  . albuterol (PROVENTIL HFA;VENTOLIN HFA) 108 (90 Base) MCG/ACT inhaler Inhale two puffs every 4-6 hours only as needed for shortness of breath or wheezing. 1 Inhaler 1  . cetirizine (ZYRTEC) 1 MG/ML syrup TAKE 10 MLS (10 MG TOTAL) BY MOUTH DAILY. 944 mL 6  . ciprofloxacin (CIPRO) 500 MG tablet Take 1 tablet (500 mg total) by mouth 2 (two) times daily. 20 tablet 2  . cyclobenzaprine (FLEXERIL) 10 MG tablet Take a half to a full tab every 8-12 hours only as needed for muscle spasm, may cause sedation. 45 tablet 0  . Garlic 123XX123 MG CAPS Take by mouth.    . hydrOXYzine (ATARAX/VISTARIL) 10 MG tablet Take 10 mg by mouth 3 (three) times daily as needed for  itching.     [name] ipratropium (ATROVENT) 0.06 % nasal spray Place 2 sprays into both nostrils 4 (four) times daily. 15 mL 1  . lidocaine (LINDAMANTLE) 3 % CREA cream Apply 1 application topically as needed. 85 g 3  . montelukast (SINGULAIR) 10 MG tablet Take 10 mg by mouth at bedtime.     . pantoprazole (PROTONIX) 40 MG tablet TAKE 1 TABLET (40 MG TOTAL) BY MOUTH DAILY. 90 tablet 1  . PROAIR HFA 108 (90 Base) MCG/ACT inhaler INHALE TWO PUFFS EVERY 4-6 HOURS ONLY AS NEEDED FOR SHORTNESS OF BREATH OR WHEEZING. 8.5 Inhaler 0   No current facility-administered medications for this visit.    Allergies  Allergen Reactions  . Aspirin Rash    Patient indicates increases Urticaria.   . Azithromycin Nausea And Vomiting  . Clindamycin/Lincomycin Hives    Facial swelling  . Erythromycin Nausea And Vomiting and Hives    Has taken azithromycin with no issues in the past.   . Metronidazole Hives    Rash, has had fluconazole (Diflucan) w/o reaction  . Morphine And Related Rash    Urticaria Urticaria   . Nsaids Swelling and Hives  . Sulfa Antibiotics Hives  . Cortisporin [Bacitra-Neomycin-Polymyxin-Hc] Rash     Exam:  BP (!) 123/59   Pulse 88   Wt 185 lb (83.9 kg)   BMI 32.77 kg/m  General:  Well Developed, well nourished, and in no acute distress.  Neuro/Psych: Alert and oriented x3, extra-ocular muscles intact, able to move all 4 extremities, sensation grossly intact. Skin: Warm and dry, no rashes noted.  Respiratory: Not using accessory muscles, speaking in full sentences, trachea midline.  Cardiovascular: Pulses palpable, no extremity edema. Abdomen: Does not appear distended. On tender. No CVA angle tenderness to percussion MSK: L spine: Nontender to midline. Tender to palpation bilateral SI joints. Lumbar motion is intact and normal. Large sugary strength and sensation are equal and normal bilaterally. Negative straight leg raise test bilaterally. Hips normal appearing bilaterally. Normal  motion. Mildly tender palpation along the insertion of the gluteus medius bilaterally.    Results for orders placed or performed in visit on 10/05/16 (from the past 48 hour(s))  POCT Urinalysis Dipstick     Status: None   Collection Time: 10/05/16  4:16 PM  Result Value Ref Range   Color, UA yellow    Clarity, UA clear    Glucose, UA neg    Bilirubin, UA neg    Ketones, UA neg    Spec Grav, UA 1.025    Blood, UA trace    pH, UA 5.5    Protein, UA neg    Urobilinogen, UA 0.2    Nitrite, UA neg    Leukocytes, UA Negative Negative   No results found.    Assessment and Plan: 55 y.o. female with  Lumbosacral strain with trochanteric bursitis possible. Treat with physical therapy TENS unit heating pad and NSAIDs.  Urinary tract infection: Culture pending. Treat with Cipro. Recheck in the near future.    Orders Placed This Encounter  Procedures  . Urine Culture  . Ambulatory referral to Physical Therapy    Referral Priority:   Routine    Referral Type:   Physical Medicine    Referral Reason:   Specialty Services Required    Requested Specialty:   Physical Therapy    Number of Visits Requested:   1  . POCT Urinalysis Dipstick    Discussed warning signs or symptoms. Please see discharge instructions. Patient expresses understanding.

## 2016-10-05 NOTE — Patient Instructions (Signed)
Thank you for coming in today. Come back or go to the emergency room if you notice new weakness new numbness problems walking or bowel or bladder problems.  Attend PT.   TENS UNIT: This is helpful for muscle pain and spasm.   Search and Purchase a TENS 7000 2nd edition at www.tenspros.com. It should be less than $30.     TENS unit instructions: Do not shower or bathe with the unit on Turn the unit off before removing electrodes or batteries If the electrodes lose stickiness add a drop of water to the electrodes after they are disconnected from the unit and place on plastic sheet. If you continued to have difficulty, call the TENS unit company to purchase more electrodes. Do not apply lotion on the skin area prior to use. Make sure the skin is clean and dry as this will help prolong the life of the electrodes. After use, always check skin for unusual red areas, rash or other skin difficulties. If there are any skin problems, does not apply electrodes to the same area. Never remove the electrodes from the unit by pulling the wires. Do not use the TENS unit or electrodes other than as directed. Do not change electrode placement without consultating your therapist or physician. Keep 2 fingers with between each electrode. Wear time ratio is 2:1, on to off times.    For example on for 30 minutes off for 15 minutes and then on for 30 minutes off for 15 minutes     Lumbosacral Strain Lumbosacral strain is an injury that causes pain in the lower back (lumbosacral spine). This injury usually occurs from overstretching the muscles or ligaments along your spine. A strain can affect one or more muscles or cord-like tissues that connect bones to other bones (ligaments). What are the causes? This condition may be caused by:  A hard, direct hit (blow) to the back.  Excessive stretching of the lower back muscles. This may result from:  A fall.  Lifting something heavy.  Repetitive movements  such as bending or crouching. What increases the risk? The following factors may increase your risk of getting this condition:  Participating in sports or activities that involve:  A sudden twist of the back.  Pushing or pulling motions.  Being overweight or obese.  Having poor strength and flexibility, especially tight hamstrings or weak muscles in the back or abdomen.  Having too much of a curve in the lower back.  Having a pelvis that is tilted forward. What are the signs or symptoms? The main symptom of this condition is pain in the lower back, at the site of the strain. Pain may extend (radiate) down one or both legs. How is this diagnosed? This condition is diagnosed based on:  Your symptoms.  Your medical history.  A physical exam.  Your health care provider may push on certain areas of your back to determine the source of your pain.  You may be asked to bend forward, backward, and side to side to assess the severity of your pain and your range of motion.  Imaging tests, such as:  X-rays.  MRI. How is this treated? Treatment for this condition may include:  Putting heat and cold on the affected area.  Medicines to help relieve pain and relax your muscles (muscle relaxants).  NSAIDs to help reduce swelling and discomfort. When your symptoms improve, it is important to gradually return to your normal routine as soon as possible to reduce pain, avoid stiffness,  and avoid loss of muscle strength. Generally, symptoms should improve within 6 weeks of treatment. However, recovery time varies. Follow these instructions at home: Managing pain, stiffness, and swelling  If directed, put ice on the injured area during the first 24 hours after your strain.  Put ice in a plastic bag.  Place a towel between your skin and the bag.  Leave the ice on for 20 minutes, 2-3 times a day.  If directed, put heat on the affected area as often as told by your health care  provider. Use the heat source that your health care provider recommends, such as a moist heat pack or a heating pad.  Place a towel between your skin and the heat source.  Leave the heat on for 20-30 minutes.  Remove the heat if your skin turns bright red. This is especially important if you are unable to feel pain, heat, or cold. You may have a greater risk of getting burned. Activity  Rest and return to your normal activities as told by your health care provider. Ask your health care provider what activities are safe for you.  Avoid activities that take a lot of energy for as long as told by your health care provider. General instructions  Take over-the-counter and prescription medicines only as told by your health care provider.  Donot drive or use heavy machinery while taking prescription pain medicine.  Do not use any products that contain nicotine or tobacco, such as cigarettes and e-cigarettes. If you need help quitting, ask your health care provider.  Keep all follow-up visits as told by your health care provider. This is important. How is this prevented?  Use correct form when playing sports and lifting heavy objects.  Use good posture when sitting and standing.  Maintain a healthy weight.  Sleep on a mattress with medium firmness to support your back.  Be safe and responsible while being active to avoid falls.  Do at least 150 minutes of moderate-intensity exercise each week, such as brisk walking or water aerobics. Try a form of exercise that takes stress off your back, such as swimming or stationary cycling.  Maintain physical fitness, including:  Strength.  Flexibility.  Cardiovascular fitness.  Endurance. Contact a health care provider if:  Your back pain does not improve after 6 weeks of treatment.  Your symptoms get worse. Get help right away if:  Your back pain is severe.  You cannot stand or walk.  You have difficulty controlling when you  urinate or when you have a bowel movement.  You feel nauseous or you vomit.  Your feet get very cold.  You have numbness, tingling, weakness, or problems using your arms or legs.  You develop any of the following:  Shortness of breath.  Dizziness.  Pain in your legs.  Weakness in your buttocks or legs.  Discoloration of the skin on your toes or legs. This information is not intended to replace advice given to you by your health care provider. Make sure you discuss any questions you have with your health care provider. Document Released: 08/03/2005 Document Revised: 05/13/2016 Document Reviewed: 03/27/2016 Elsevier Interactive Patient Education  2017 [employer].

## 2016-10-06 LAB — URINE CULTURE

## 2016-10-19 ENCOUNTER — Ambulatory Visit (INDEPENDENT_AMBULATORY_CARE_PROVIDER_SITE_OTHER): Payer: Medicare Other | Admitting: Family Medicine

## 2016-10-19 ENCOUNTER — Encounter: Payer: Self-pay | Admitting: Family Medicine

## 2016-10-19 VITALS — BP 122/67 | HR 79 | Temp 98.2°F | Wt 179.0 lb

## 2016-10-19 DIAGNOSIS — R1013 Epigastric pain: Secondary | ICD-10-CM

## 2016-10-19 DIAGNOSIS — S60459A Superficial foreign body of unspecified finger, initial encounter: Secondary | ICD-10-CM

## 2016-10-19 NOTE — Patient Instructions (Signed)
Thank you for coming in today. We will keep an eye on the abd pain.  Return as needed.  If your belly pain worsens, or you have high fever, bad vomiting, blood in your stool or black tarry stool go to the Emergency Room.

## 2016-10-19 NOTE — Progress Notes (Signed)
Janet Richards is a 55 y.o. female who presents to [place]: [place] today for splinter and abdominal pain.   Splinter: Patient has a splinter in her left index finger. It's been present for about 2 days. She's tried removing it herself and has been unable to. It has become red and painful. He is not tried any other treatment and denies any fevers or chills.  Abdominal pain: Patient had a short duration of epigastric abdominal pain and cramping 2 days ago. The pain is since resolved completely. She feels back to normal. She denies any vomiting or diarrhea.     Past Medical History:  Diagnosis Date  . Essential hypertension, benign   . Menstrual migraine   . Primary osteoarthritis of both knees   . Urticaria    Past Surgical History:  Procedure Laterality Date  . TUBAL LIGATION  12/26/1987   Social History  Substance Use Topics  . Smoking status: Former Smoker    Packs/day: 1.50    Types: Cigarettes    Quit date: 02/05/2006  . Smokeless tobacco: Never Used  . Alcohol use Yes     Comment: rarely   family history includes CAD in her father; Hypertension in her mother.  ROS as above:  Medications: Current Outpatient Prescriptions  Medication Sig Dispense Refill  . albuterol (PROVENTIL HFA;VENTOLIN HFA) 108 (90 Base) MCG/ACT inhaler Inhale two puffs every 4-6 hours only as needed for shortness of breath or wheezing. 1 Inhaler 1  . cetirizine (ZYRTEC) 1 MG/ML syrup TAKE 10 MLS (10 MG TOTAL) BY MOUTH DAILY. 944 mL 6  . ciprofloxacin (CIPRO) 500 MG tablet Take 1 tablet (500 mg total) by mouth 2 (two) times daily. 20 tablet 2  . cyclobenzaprine (FLEXERIL) 10 MG tablet Take a half to a full tab every 8-12 hours only as needed for muscle spasm, may cause sedation. 45 tablet 0  . Garlic 123XX123 MG CAPS Take by mouth.    . hydrOXYzine (ATARAX/VISTARIL) 10 MG tablet Take 10  mg by mouth 3 (three) times daily as needed for itching.     [name] ipratropium (ATROVENT) 0.06 % nasal spray Place 2 sprays into both nostrils 4 (four) times daily. 15 mL 1  . lidocaine (LINDAMANTLE) 3 % CREA cream Apply 1 application topically as needed. 85 g 3  . montelukast (SINGULAIR) 10 MG tablet Take 10 mg by mouth at bedtime.     . pantoprazole (PROTONIX) 40 MG tablet TAKE 1 TABLET (40 MG TOTAL) BY MOUTH DAILY. 90 tablet 1  . PROAIR HFA 108 (90 Base) MCG/ACT inhaler INHALE TWO PUFFS EVERY 4-6 HOURS ONLY AS NEEDED FOR SHORTNESS OF BREATH OR WHEEZING. 8.5 Inhaler 0   No current facility-administered medications for this visit.    Allergies  Allergen Reactions  . Aspirin Rash    Patient indicates increases Urticaria.   . Azithromycin Nausea And Vomiting  . Clindamycin/Lincomycin Hives    Facial swelling  . Erythromycin Nausea And Vomiting and Hives    Has taken azithromycin with no issues in the past.   . Metronidazole Hives    Rash, has had fluconazole (Diflucan) w/o reaction  . Morphine And Related Rash    Urticaria Urticaria   . Nsaids Swelling and Hives  . Sulfa Antibiotics Hives  . Cortisporin [Bacitra-Neomycin-Polymyxin-Hc] Rash    Health Maintenance Health Maintenance  Topic Date Due  . Hepatitis C Screening  July 10, 1961  . PAP SMEAR  07/23/2016  .  MAMMOGRAM  08/16/2018  . TETANUS/TDAP  10/17/2024  . COLONOSCOPY  03/19/2025  . INFLUENZA VACCINE  Completed  . HIV Screening  Completed     Exam:  BP 122/67   Pulse 79   Temp 98.2 F (36.8 C) (Oral)   Wt 179 lb (81.2 kg)   SpO2 99%   BMI 31.71 kg/m  Gen: Well NAD HEENT: EOMI,  MMM Lungs: Normal work of breathing. CTABL Heart: RRR no MRG Abd: NABS, Soft. Nondistended, Nontender  Exts: Brisk capillary refill, warm and well perfused.  Left index finger: Erythematous tender fluctuant area radial proximal phalanx just proximal to the PIP. Small woody body visible under the skin.  Removal of foreign  body: Consent obtained and timeout performed. Skin cleaned with alcohol. A 25-gauge needle was used to enlarge the entrance hole and two 5 mm slender splinters were expressed along with some pus. Patient felt much better.  No results found for this or any previous visit (from the past 72 hour(s)). No results found.    Assessment and Plan: 55 y.o. female with  Splinter: Removed today. Plan for watchful waiting. Abdominal pain: Seems to have resolved. Plan for watchful waiting if symptoms return and we'll proceed with workup.   No orders of the defined types were placed in this encounter.   Discussed warning signs or symptoms. Please see discharge instructions. Patient expresses understanding.

## 2016-10-25 ENCOUNTER — Encounter: Payer: Self-pay | Admitting: Obstetrics & Gynecology

## 2016-10-25 ENCOUNTER — Other Ambulatory Visit (HOSPITAL_COMMUNITY)
Admission: RE | Admit: 2016-10-25 | Discharge: 2016-10-25 | Disposition: A | Discharge status: Home / Self Care | Payer: Medicare Other | Source: Ambulatory Visit | Attending: Obstetrics & Gynecology | Admitting: Obstetrics & Gynecology

## 2016-10-25 ENCOUNTER — Ambulatory Visit (INDEPENDENT_AMBULATORY_CARE_PROVIDER_SITE_OTHER): Payer: Medicare Other | Admitting: Obstetrics & Gynecology

## 2016-10-25 VITALS — BP 110/67 | HR 89 | Ht 64.0 in | Wt 178.0 lb

## 2016-10-25 DIAGNOSIS — Z124 Encounter for screening for malignant neoplasm of cervix: Secondary | ICD-10-CM | POA: Diagnosis not present

## 2016-10-25 DIAGNOSIS — Z1151 Encounter for screening for human papillomavirus (HPV): Secondary | ICD-10-CM | POA: Diagnosis present

## 2016-10-25 DIAGNOSIS — Z01419 Encounter for gynecological examination (general) (routine) without abnormal findings: Secondary | ICD-10-CM

## 2016-10-25 DIAGNOSIS — Z0000 Encounter for general adult medical examination without abnormal findings: Secondary | ICD-10-CM

## 2016-10-25 NOTE — Progress Notes (Signed)
Subjective:    Janet Richards is a 55 y.o. M AAP 3 (36, 41, and 24 yo kids, and raising her 45 yo grandson) female who presents for an annual exam. The patient has no complaints today. The patient is sexually active. GYN screening history: last pap: was normal. The patient wears seatbelts: yes. The patient participates in regular exercise: no. Has the patient ever been transfused or tattooed?: no. The patient reports that there is not domestic violence in her life.   Menstrual History: OB History    Gravida Para Term Preterm AB Living   5 3 3   2 3    SAB TAB Ectopic Multiple Live Births     2             Patient's last menstrual period was 10/06/2016.    The following portions of the patient's history were reviewed and updated as appropriate: allergies, current medications, past family history, past medical history, past social history, past surgical history and problem list.  Review of Systems Pertinent items are noted in HPI.    Objective:    BP 110/67   Pulse 89   Ht 5\' 4"  (1.626 m)   Wt 178 lb (80.7 kg)   LMP 10/06/2016   BMI 30.55 kg/m   General Appearance:    Alert, cooperative, no distress, appears stated age  Head:    Normocephalic, without obvious abnormality, atraumatic  Eyes:    PERRL, conjunctiva/corneas clear, EOM's intact, fundi    benign, both eyes  Ears:    Normal TM's and external ear canals, both ears  Nose:   Nares normal, septum midline, mucosa normal, no drainage    or sinus tenderness  Throat:   Lips, mucosa, and tongue normal; teeth and gums normal  Neck:   Supple, symmetrical, trachea midline, no adenopathy;    thyroid:  no enlargement/tenderness/nodules; no carotid   bruit or JVD  Back:     Symmetric, no curvature, ROM normal, no CVA tenderness  Lungs:     Clear to auscultation bilaterally, respirations unlabored  Chest Wall:    No tenderness or deformity   Heart:    Regular rate and rhythm, S1 and S2 normal, no murmur, rub   or gallop  Breast  Exam:    No tenderness, masses, or nipple abnormality  Abdomen:     Soft, non-tender, bowel sounds active all four quadrants,    no masses, no organomegaly, obese  Genitalia:    Normal female without lesion, discharge or tenderness, cervix posterior, NSSA, NT, no palpable adnexal masses     Extremities:   Extremities normal, atraumatic, no cyanosis or edema  Pulses:   2+ and symmetric all extremities  Skin:   Skin color, texture, turgor normal, no rashes or lesions  Lymph nodes:   Cervical, supraclavicular, and axillary nodes normal  Neurologic:   CNII-XII intact, normal strength, sensation and reflexes    throughout   .    Assessment:    Healthy female exam.    Plan:     Thin prep Pap smear.   Rec healthy lifestyle

## 2016-10-27 LAB — CYTOLOGY - PAP
Diagnosis: NEGATIVE
HPV: NOT DETECTED

## 2016-11-08 ENCOUNTER — Encounter: Payer: Self-pay | Admitting: Family Medicine

## 2016-11-08 ENCOUNTER — Other Ambulatory Visit: Payer: Self-pay | Admitting: Family Medicine

## 2016-11-08 ENCOUNTER — Ambulatory Visit (INDEPENDENT_AMBULATORY_CARE_PROVIDER_SITE_OTHER): Payer: Medicare Other | Admitting: Family Medicine

## 2016-11-08 VITALS — BP 127/62 | HR 82 | Temp 98.3°F | Wt 183.0 lb

## 2016-11-08 DIAGNOSIS — R0602 Shortness of breath: Secondary | ICD-10-CM

## 2016-11-08 DIAGNOSIS — R6889 Other general symptoms and signs: Secondary | ICD-10-CM | POA: Diagnosis not present

## 2016-11-08 NOTE — Patient Instructions (Signed)
Thank you for coming in today. Schedule a Pulmonary Function Test soon.  Bring that paperwork back soon.  Return as needed.

## 2016-11-09 ENCOUNTER — Ambulatory Visit (INDEPENDENT_AMBULATORY_CARE_PROVIDER_SITE_OTHER): Payer: Medicare Other | Admitting: Family Medicine

## 2016-11-09 VITALS — Resp 60 | Ht 63.0 in | Wt 183.0 lb

## 2016-11-09 DIAGNOSIS — R0602 Shortness of breath: Secondary | ICD-10-CM | POA: Diagnosis not present

## 2016-11-09 DIAGNOSIS — R6889 Other general symptoms and signs: Secondary | ICD-10-CM | POA: Insufficient documentation

## 2016-11-09 HISTORY — DX: Other general symptoms and signs: R68.89

## 2016-11-09 NOTE — Progress Notes (Signed)
Patient is here for PFT. Completed pre and post test. Results printed and given to Dr. Clovis Riley assistant. Patient wanted to be given a call with results/recomendations instead of waiting to be seen today by Dr. Georgina Snell since his schedule was running behind.

## 2016-11-09 NOTE — Progress Notes (Signed)
Janet Richards is a 56 y.o. female who presents to [place]: [place] today for discuss right leg testing.  Janet Richards insurance company paid for a home health nurse to do a home assessment as part of her annual Medicare services. As part of this testing she had an ABI right foot measured 0.83 and her left foot 1.04. Janet Richards does not knowingly have any significant right leg symptoms. She denies significant pain or claudication with activity. She occasionally will progress notes a burning sensation in her right foot at rest but otherwise feels well with no fevers or chills nausea vomiting or diarrhea.  Additionally Janet Richards notes occasional shortness of breath. She notes this happens when she is talking a lot. She denies chest pain or significant shortness of breath with exertion. No fevers chills nausea vomiting or diarrhea.   Past Medical History:  Diagnosis Date  . Essential hypertension, benign   . Menstrual migraine   . Primary osteoarthritis of both knees   . Urticaria    Past Surgical History:  Procedure Laterality Date  . TUBAL LIGATION  12/26/1987   Social History  Substance Use Topics  . Smoking status: Former Smoker    Packs/day: 1.50    Types: Cigarettes    Quit date: 02/05/2006  . Smokeless tobacco: Never Used  . Alcohol use Yes     Comment: rarely   family history includes CAD in her father; Hypertension in her mother.  ROS as above:  Medications: Current Outpatient Prescriptions  Medication Sig Dispense Refill  . albuterol (PROVENTIL HFA;VENTOLIN HFA) 108 (90 Base) MCG/ACT inhaler Inhale two puffs every 4-6 hours only as needed for shortness of breath or wheezing. 1 Inhaler 1  . cetirizine (ZYRTEC) 1 MG/ML syrup TAKE 10 MLS (10 MG TOTAL) BY MOUTH DAILY. 944 mL 6  . cyclobenzaprine (FLEXERIL) 10 MG tablet Take a half to a full tab every 8-12 hours  only as needed for muscle spasm, may cause sedation. 45 tablet 0  . hydrOXYzine (ATARAX/VISTARIL) 10 MG tablet Take 10 mg by mouth 3 (three) times daily as needed for itching.     [name] ipratropium (ATROVENT) 0.06 % nasal spray Place 2 sprays into both nostrils 4 (four) times daily. 15 mL 1  . montelukast (SINGULAIR) 10 MG tablet Take 10 mg by mouth at bedtime.     . pantoprazole (PROTONIX) 40 MG tablet TAKE 1 TABLET (40 MG TOTAL) BY MOUTH DAILY. 90 tablet 1  . PROAIR HFA 108 (90 Base) MCG/ACT inhaler INHALE TWO PUFFS EVERY 4-6 HOURS ONLY AS NEEDED FOR SHORTNESS OF BREATH OR WHEEZING. 8.5 Inhaler 0   No current facility-administered medications for this visit.    Allergies  Allergen Reactions  . Azithromycin Nausea And Vomiting  . Clindamycin/Lincomycin Hives    Facial swelling  . Erythromycin Nausea And Vomiting and Hives    Has taken azithromycin with no issues in the past.   . Metronidazole Hives    Rash, has had fluconazole (Diflucan) w/o reaction  . Nsaids Swelling and Hives  . Sulfa Antibiotics Hives    Health Maintenance Health Maintenance  Topic Date Due  . Hepatitis C Screening  1961-02-15  . MAMMOGRAM  08/16/2018  . PAP SMEAR  10/26/2019  . TETANUS/TDAP  10/17/2024  . COLONOSCOPY  03/19/2025  . INFLUENZA VACCINE  Completed  . HIV Screening  Completed     Exam:  BP 127/62   Pulse 82  Temp 98.3 F (36.8 C) (Oral)   Wt 183 lb (83 kg)   LMP 10/06/2016   SpO2 100%   BMI 31.41 kg/m  Gen: Well NAD HEENT: EOMI,  MMM Lungs: Normal work of breathing. CTABL Heart: RRR no MRG Abd: NABS, Soft. Nondistended, Nontender Exts: Brisk capillary refill, warm and well perfused.  Feet are normal-appearing bilaterally with normal pulses capillary refill sensation and pulses throughout.  No results found for this or any previous visit (from the past 72 hour(s)). No results found.    Assessment and Plan: 56 y.o. female with  Abnormal ABI. Unclear etiology. Plan for further  vascular ultrasound testing.  Shortness of breath: Normal vital signs and pulmonary exam today. Plan for pulmonary function testing in the near future.   Orders Placed This Encounter  Procedures  . [place] Lower Ext Art Bilat    Standing Status:   Future    Standing Expiration Date:   01/07/2018    Order Specific Question:   Reason for Exam (SYMPTOM  OR DIAGNOSIS REQUIRED)    Answer:   Abnormal screening ABI    Order Specific Question:   Preferred imaging location?    Answer:   [facility]    Discussed warning signs or symptoms. Please see discharge instructions. Patient expresses understanding.

## 2016-11-10 NOTE — Addendum Note (Signed)
Addended by: Huel Cote on: 11/10/2016 11:55 AM   Modules accepted: Orders

## 2016-11-11 ENCOUNTER — Telehealth: Payer: Self-pay | Admitting: *Deleted

## 2016-11-11 NOTE — Telephone Encounter (Signed)
Patient called wanting to know the results of her spirometry test. She also requests an antibiotic stating her eyes have been watery and nose is runny

## 2016-11-14 ENCOUNTER — Encounter: Payer: Self-pay | Admitting: Family Medicine

## 2016-11-14 ENCOUNTER — Ambulatory Visit (INDEPENDENT_AMBULATORY_CARE_PROVIDER_SITE_OTHER): Payer: Medicare Other | Admitting: Family Medicine

## 2016-11-14 VITALS — BP 119/58 | HR 82 | Wt 183.0 lb

## 2016-11-14 DIAGNOSIS — K047 Periapical abscess without sinus: Secondary | ICD-10-CM

## 2016-11-14 MED ORDER — CIPROFLOXACIN HCL 500 MG PO TABS: 500.0000 mg | ORAL_TABLET | Freq: Two times a day (BID) | ORAL | 3 refills | Status: DC

## 2016-11-14 NOTE — Telephone Encounter (Signed)
Spirometry is inconclusive but probably does not indicate any breathing issues.  We will discuss the results in detail at the next visit.   I recommend using the below for eye symptoms.   Use over-the-counter Zaditor eyedrops (Ketotifen) Use over-the-counter Zyrtec (cetirizine)  Use Systane artificial tears as needed

## 2016-11-14 NOTE — Telephone Encounter (Signed)
Patient notified and voiced understanding.

## 2016-11-14 NOTE — Patient Instructions (Signed)
Thank you for coming in today. Use cipro for infection.  Return in 1 month or sooner if needed.

## 2016-11-14 NOTE — Progress Notes (Signed)
Janet Richards is a 56 y.o. female who presents to [place] today for dental infection. Patient presents to clinic with recurrent dental infection.  Patient has difficulty affording the oral surgery to remove her teeth as recurrent dental infections. She notes facial pain and pressure without fevers or chills.   Past Medical History:  Diagnosis Date  . Essential hypertension, benign   . Menstrual migraine   . Primary osteoarthritis of both knees   . Urticaria    Past Surgical History:  Procedure Laterality Date  . TUBAL LIGATION  12/26/1987   Social History  Substance Use Topics  . Smoking status: Former Smoker    Packs/day: 1.50    Types: Cigarettes    Quit date: 02/05/2006  . Smokeless tobacco: Never Used  . Alcohol use Yes     Comment: rarely   family history includes CAD in her father; Hypertension in her mother.  ROS as above:  Medications: Current Outpatient Prescriptions  Medication Sig Dispense Refill  . albuterol (PROVENTIL HFA;VENTOLIN HFA) 108 (90 Base) MCG/ACT inhaler Inhale two puffs every 4-6 hours only as needed for shortness of breath or wheezing. 1 Inhaler 1  . cetirizine (ZYRTEC) 1 MG/ML syrup TAKE 10 MLS (10 MG TOTAL) BY MOUTH DAILY. 944 mL 6  . cyclobenzaprine (FLEXERIL) 10 MG tablet Take a half to a full tab every 8-12 hours only as needed for muscle spasm, may cause sedation. 45 tablet 0  . hydrOXYzine (ATARAX/VISTARIL) 10 MG tablet Take 10 mg by mouth 3 (three) times daily as needed for itching.     [name] ipratropium (ATROVENT) 0.06 % nasal spray Place 2 sprays into both nostrils 4 (four) times daily. 15 mL 1  . montelukast (SINGULAIR) 10 MG tablet Take 10 mg by mouth at bedtime.     . pantoprazole (PROTONIX) 40 MG tablet TAKE 1 TABLET (40 MG TOTAL) BY MOUTH DAILY. 90 tablet 1  . PROAIR HFA 108 (90 Base) MCG/ACT inhaler INHALE TWO  PUFFS EVERY 4-6 HOURS ONLY AS NEEDED FOR SHORTNESS OF BREATH OR WHEEZING. 8.5 Inhaler 0  . ciprofloxacin (CIPRO) 500 MG tablet Take 1 tablet (500 mg total) by mouth 2 (two) times daily. 20 tablet 3   No current facility-administered medications for this visit.    Allergies  Allergen Reactions  . Azithromycin Nausea And Vomiting  . Clindamycin/Lincomycin Hives    Facial swelling  . Erythromycin Nausea And Vomiting and Hives    Has taken azithromycin with no issues in the past.   . Metronidazole Hives    Rash, has had fluconazole (Diflucan) w/o reaction  . Nsaids Swelling and Hives  . Sulfa Antibiotics Hives    Health Maintenance Health Maintenance  Topic Date Due  . Hepatitis C Screening  1960-11-11  . MAMMOGRAM  08/16/2018  . PAP SMEAR  10/26/2019  . TETANUS/TDAP  10/17/2024  . COLONOSCOPY  03/19/2025  . INFLUENZA VACCINE  Completed  . HIV Screening  Completed     Exam:  BP (!) 119/58   Pulse 82   Wt 183 lb (83 kg)   LMP 10/06/2016   BMI 32.42 kg/m  Gen: Well NAD HEENT: EOMI,  MMM Poor dentition with multiple rotten teeth with gumline erythema. No visible abscesses present. Tender palpation maxillary sinus area bilaterally. Lungs: Normal work of breathing. CTABL Heart: RRR no MRG Abd: NABS, Soft. Nondistended, Nontender Exts: Brisk capillary refill, warm and well perfused.    No  results found for this or any previous visit (from the past 55 hour(s)). No results found.    Assessment and Plan: 56 y.o. female with dental infection. Treat with antibiotics. Patient can tolerate Cipro and has multiple drug allergies. Return in one month or sooner if needed.   No orders of the defined types were placed in this encounter.   Discussed warning signs or symptoms. Please see discharge instructions. Patient expresses understanding.

## 2016-12-31 ENCOUNTER — Emergency Department (INDEPENDENT_AMBULATORY_CARE_PROVIDER_SITE_OTHER)
Admission: EM | Admit: 2016-12-31 | Discharge: 2016-12-31 | Disposition: A | Discharge status: Home / Self Care | Payer: Medicare Other | Source: Home / Self Care | Attending: Family Medicine | Admitting: Family Medicine

## 2016-12-31 ENCOUNTER — Encounter: Payer: Self-pay | Admitting: Emergency Medicine

## 2016-12-31 DIAGNOSIS — K0381 Cracked tooth: Secondary | ICD-10-CM

## 2016-12-31 DIAGNOSIS — H9203 Otalgia, bilateral: Secondary | ICD-10-CM

## 2016-12-31 DIAGNOSIS — R52 Pain, unspecified: Secondary | ICD-10-CM | POA: Diagnosis not present

## 2016-12-31 DIAGNOSIS — M940 Chondrocostal junction syndrome [Tietze]: Secondary | ICD-10-CM

## 2016-12-31 NOTE — ED Triage Notes (Signed)
Pt c/o body aches and ear pain x1 week. Denies fever

## 2016-12-31 NOTE — ED Provider Notes (Signed)
CSN: [id number]     Arrival date & time 12/31/16  1401 History   First MD Initiated Contact with Patient 12/31/16 1434     Chief Complaint  Patient presents with  . Generalized Body Aches   (Consider location/radiation/quality/duration/timing/severity/associated sxs/prior Treatment) HPI  Janet Richards is a 56 y.o. female presenting to UC with c/o body aches and bilateral ear pain for about 1 week.  Pt notes she is currently on Ciprofloxacin for a cracked Right upper tooth and is concerned the tooth infection has caused her other symptoms. She also reports centralized chest soreness that started earlier today while leaning forward. She notes she does lift weights to exercise from time to time.  Pain is reproduced by pushing on her chest.  Denies SOB. No hx of Janet problems. No family hx of Janet problems.    Past Medical History:  Diagnosis Date  . Essential hypertension, benign   . Menstrual migraine   . Primary osteoarthritis of both knees   . Urticaria    Past Surgical History:  Procedure Laterality Date  . TUBAL LIGATION  12/26/1987   Family History  Problem Relation Age of Onset  . Hypertension Mother   . CAD Father     MI in his 71s   Social History  Substance Use Topics  . Smoking status: Former Smoker    Packs/day: 1.50    Types: Cigarettes    Quit date: 02/05/2006  . Smokeless tobacco: Never Used  . Alcohol use Yes     Comment: rarely   OB History    Gravida Para Term Preterm AB Living   5 3 3   2 3    SAB TAB Ectopic Multiple Live Births     2           Review of Systems  Constitutional: Negative for chills and fever.  HENT: Positive for dental problem and ear pain (bilateral fullness). Negative for congestion, sore throat, trouble swallowing and voice change.   Respiratory: Negative for cough and shortness of breath.   Cardiovascular: Positive for chest pain ( soreness). Negative for palpitations.  Gastrointestinal: Negative for abdominal pain, diarrhea,  nausea and vomiting.  Musculoskeletal: Positive for arthralgias, back pain and myalgias.  Skin: Negative for rash.    Allergies  Azithromycin; Clindamycin/lincomycin; Erythromycin; Metronidazole; Nsaids; and Sulfa antibiotics  Home Medications   Prior to Admission medications   Medication Sig Start Date End Date Taking? Authorizing Provider  albuterol (PROVENTIL HFA;VENTOLIN HFA) 108 (90 Base) MCG/ACT inhaler Inhale two puffs every 4-6 hours only as needed for shortness of breath or wheezing. 01/19/16 01/18/17  [name], MD  cetirizine (ZYRTEC) 1 MG/ML syrup TAKE 10 MLS (10 MG TOTAL) BY MOUTH DAILY. 02/04/16   [name], MD  ciprofloxacin (CIPRO) 500 MG tablet Take 1 tablet (500 mg total) by mouth 2 (two) times daily. 11/14/16   [name], MD  cyclobenzaprine (FLEXERIL) 10 MG tablet Take a half to a full tab every 8-12 hours only as needed for muscle spasm, may cause sedation. 08/08/13   Marcial Pacas, DO  hydrOXYzine (ATARAX/VISTARIL) 10 MG tablet Take 10 mg by mouth 3 (three) times daily as needed for itching.     Historical Provider, MD  ipratropium (ATROVENT) 0.06 % nasal spray Place 2 sprays into both nostrils 4 (four) times daily. 12/22/15   [name], MD  montelukast (SINGULAIR) 10 MG tablet Take 10 mg by mouth at bedtime.  04/04/13   Historical Provider, MD  pantoprazole (PROTONIX) 40 MG tablet TAKE 1 TABLET (40 MG TOTAL) BY MOUTH DAILY. 11/08/16   [name], MD  PROAIR HFA 108 [phone] Base) MCG/ACT inhaler INHALE TWO PUFFS EVERY 4-6 HOURS ONLY AS NEEDED FOR SHORTNESS OF BREATH OR WHEEZING. 07/26/16   [name], MD   Meds Ordered and Administered this Visit  Medications - No data to display  BP 108/70 (BP Location: Right Arm)   Pulse 89   Wt 184 lb (83.5 kg)   SpO2 96%   BMI 32.59 kg/m  No data found.   Physical Exam  Constitutional: She is oriented to person, place, and time. She appears well-developed and well-nourished. No distress.  HENT:  Head: Normocephalic and  atraumatic.  Right Ear: Tympanic membrane normal.  Left Ear: Tympanic membrane normal.  Nose: Nose normal.  Mouth/Throat: Uvula is midline, oropharynx is clear and moist and mucous membranes are normal. Dental caries present.    Dental caries including cracked Right upper molar. No gingival abscess present. No bleeding or drainage.   Eyes: EOM are normal.  Neck: Normal range of motion. Neck supple.  Cardiovascular: Normal rate.   Pulmonary/Chest: Effort normal and breath sounds normal. No stridor. No respiratory distress. She has no wheezes. She has no rales. She exhibits tenderness.  Musculoskeletal: Normal range of motion.  Lymphadenopathy:    She has no cervical adenopathy.  Neurological: She is alert and oriented to person, place, and time.  Skin: Skin is warm and dry. She is not diaphoretic.  Psychiatric: She has a normal mood and affect. Her behavior is normal.  Nursing note and vitals reviewed.   Urgent Care Course     Procedures (including critical care time)  Labs Review Labs Reviewed - No data to display  Imaging Review No results found.    MDM   1. Body aches   2. Costochondritis   3. Acute ear pain, bilateral   4. Cracked tooth    Pt with c/o body aches believed to be from her bad tooth. Pt is already on Ciprofloxacin.  No evidence of gingival abscess at this time. Chest pain c/w costochondritis. Atypical for ACS. Pt may try OTC naproxen.  Allergies list NSAIDs, however, pt notes she takes aspirin on occasion w/o side effects.  F/u with PCP and dentist later this week for recheck of symptoms. Continue to take Cipro as prescribed.     Noland Fordyce, PA-C 12/31/16 1756

## 2017-01-18 ENCOUNTER — Other Ambulatory Visit: Payer: Self-pay | Admitting: Family Medicine

## 2017-01-18 ENCOUNTER — Telehealth: Payer: Self-pay | Admitting: Family Medicine

## 2017-01-18 MED ORDER — HYDROCHLOROTHIAZIDE 25 MG PO TABS: ORAL_TABLET | ORAL | 0 refills | Status: DC

## 2017-01-18 NOTE — Telephone Encounter (Signed)
I know the above patient has switched around a lot in office. At times it has been noted because she "did not get what she wanted". Is this the case with her now?

## 2017-01-18 NOTE — Addendum Note (Signed)
Addended by: Isaias Cowman C on: 01/18/2017 11:10 AM   Modules accepted: Orders

## 2017-01-18 NOTE — Telephone Encounter (Signed)
Patient called about refill for Hydrochlorothiqzide 25mg s adv pharmacy denied needs prior authorization. Thanks

## 2017-01-18 NOTE — Telephone Encounter (Signed)
OK to switch. I havent declined anything recently. I think she just wants a female provider.

## 2017-01-18 NOTE — Telephone Encounter (Signed)
Patient called advised that she prefer to see a female provider and that her spouse is preferring her to see a female provider as well. Would like to change to [facility]. Thanks

## 2017-01-18 NOTE — Telephone Encounter (Signed)
This is not on her current med list. Also her last few office visit BP's have been normal. Called and left message stating this and asked to clarify if truly needs this med.

## 2017-01-18 NOTE — Telephone Encounter (Signed)
Patient called back and states she has been taking this medication every day. Actually last refill was written in last June for # 90 with 2 refills so this would be about right.

## 2017-01-18 NOTE — Telephone Encounter (Signed)
Ok to switch 

## 2017-01-29 DIAGNOSIS — D649 Anemia, unspecified: Secondary | ICD-10-CM | POA: Diagnosis not present

## 2017-01-29 DIAGNOSIS — J019 Acute sinusitis, unspecified: Secondary | ICD-10-CM | POA: Diagnosis not present

## 2017-01-29 DIAGNOSIS — I10 Essential (primary) hypertension: Secondary | ICD-10-CM | POA: Diagnosis not present

## 2017-01-29 DIAGNOSIS — J309 Allergic rhinitis, unspecified: Secondary | ICD-10-CM | POA: Diagnosis not present

## 2017-02-06 ENCOUNTER — Ambulatory Visit (INDEPENDENT_AMBULATORY_CARE_PROVIDER_SITE_OTHER): Payer: Medicare Other | Admitting: Physician Assistant

## 2017-02-06 ENCOUNTER — Ambulatory Visit: Payer: Self-pay | Admitting: Physician Assistant

## 2017-02-06 ENCOUNTER — Encounter: Payer: Self-pay | Admitting: Physician Assistant

## 2017-02-06 VITALS — BP 134/85 | HR 92 | Ht 63.0 in | Wt 185.0 lb

## 2017-02-06 DIAGNOSIS — N898 Other specified noninflammatory disorders of vagina: Secondary | ICD-10-CM

## 2017-02-06 LAB — WET PREP FOR TRICH, YEAST, CLUE
Clue Cells Wet Prep HPF POC: NONE SEEN
Trich, Wet Prep: NONE SEEN
WBC, Wet Prep HPF POC: NONE SEEN
Yeast Wet Prep HPF POC: NONE SEEN

## 2017-02-06 LAB — POCT URINALYSIS DIPSTICK
Bilirubin, UA: NEGATIVE
Glucose, UA: NEGATIVE
Ketones, UA: NEGATIVE
Leukocytes, UA: NEGATIVE
Nitrite, UA: NEGATIVE
Protein, UA: NEGATIVE
Spec Grav, UA: 1.01 (ref 1.030–1.035)
Urobilinogen, UA: 0.2 (ref ?–2.0)
pH, UA: 5.5 (ref 5.0–8.0)

## 2017-02-06 MED ORDER — FLUCONAZOLE 150 MG PO TABS: 150.0000 mg | ORAL_TABLET | Freq: Once | ORAL | 0 refills | Status: AC

## 2017-02-06 NOTE — Progress Notes (Signed)
   Subjective:    Patient ID: Janet Richards, female    DOB: March 06, 1961, 56 y.o.   MRN: [id number]  HPI  Pt is a 56 yo female who presents to the clinic with vaginal itching and white discharge. Hx of yeast infection after abx use. She just finished amoxicillin after cracked tooth. Her symptoms started a few days into abx. Not tried anything to make better. No abdominal pain. She does have occasional burning with urination. No vaginal odor.      Review of Systems See HPI.     Objective:   Physical Exam  Constitutional: She is oriented to person, place, and time. She appears well-developed and well-nourished.  HENT:  Head: Normocephalic and atraumatic.  Poor dentition.   Eyes: Conjunctivae are normal. Right eye exhibits no discharge. Left eye exhibits no discharge.  Cardiovascular: Normal rate, regular rhythm and normal heart sounds.   Pulmonary/Chest: Effort normal and breath sounds normal.  No CVA tenderness.   Abdominal: Soft. She exhibits no distension. There is no tenderness.  Neurological: She is alert and oriented to person, place, and time.  Psychiatric: She has a normal mood and affect. Her behavior is normal.          Assessment & Plan:  Marland KitchenMarland KitchenDiagnoses and all orders for this visit:  Vaginal irritation -     POCT urinalysis dipstick -     fluconazole (DIFLUCAN) 150 MG tablet; Take 1 tablet (150 mg total) by mouth once. Repeat in 48-72 hours if needed. -     WET PREP FOR [place], YEAST, CLUE  .[name] Results for orders placed or performed in visit on 02/06/17  WET PREP FOR [place], YEAST, CLUE  Result Value Ref Range   Yeast Wet Prep HPF POC NONE SEEN NONE SEEN   Trich, Wet Prep NONE SEEN NONE SEEN   Clue Cells Wet Prep HPF POC NONE SEEN NONE SEEN   WBC, Wet Prep HPF POC NONE SEEN NONE SEEN  POCT urinalysis dipstick  Result Value Ref Range   Color, UA light yellow    Clarity, UA clear    Glucose, UA neg    Bilirubin, UA neg    Ketones, UA neg    Spec Grav, UA 1.010  1.030 - 1.035   Blood, UA small    pH, UA 5.5 5.0 - 8.0   Protein, UA neg    Urobilinogen, UA 0.2 Negative - 2.0   Nitrite, UA neg    Leukocytes, UA Negative Negative   Discussed no UTI. On period.   Due to hx went ahead and sent diflucan. Follow up if symptoms persist.

## 2017-02-06 NOTE — Progress Notes (Signed)
Call pt: no yeast, clue, or trich seen on wet prep. Should not need diflucan. If symptoms persist let me know.

## 2017-02-06 NOTE — Patient Instructions (Signed)

## 2017-02-21 IMAGING — CR DG KNEE COMPLETE 4+V*R*
4 series · 4 of 4 positions shown · non-contrast
Comparison: 12/22/2012 right knee radiographs.

CLINICAL DATA: Right knee pain anteriorly and posteriorly with
swelling status post fall.

EXAM:
RIGHT KNEE - COMPLETE 4+ VIEW

[knee ap]
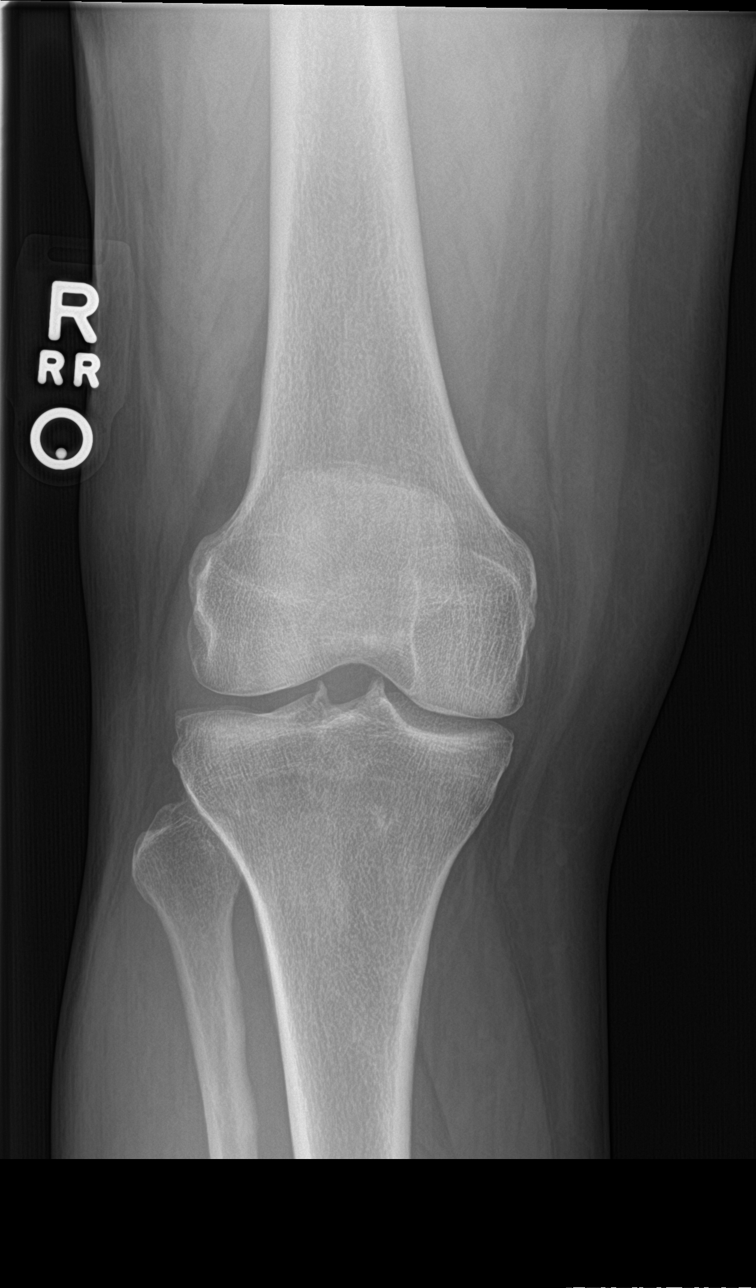

[tunnel]
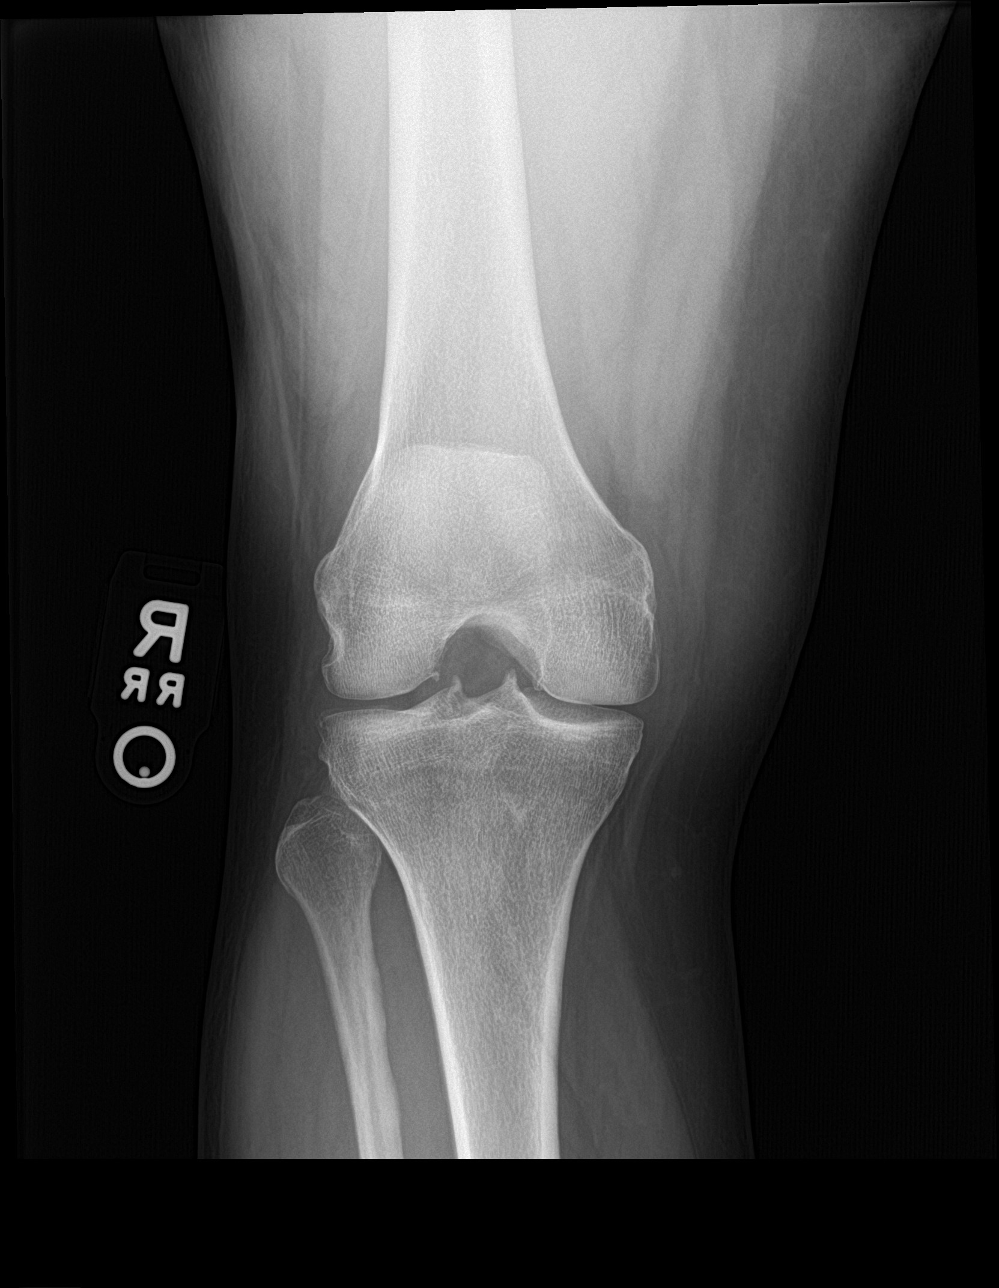

[knee lat]
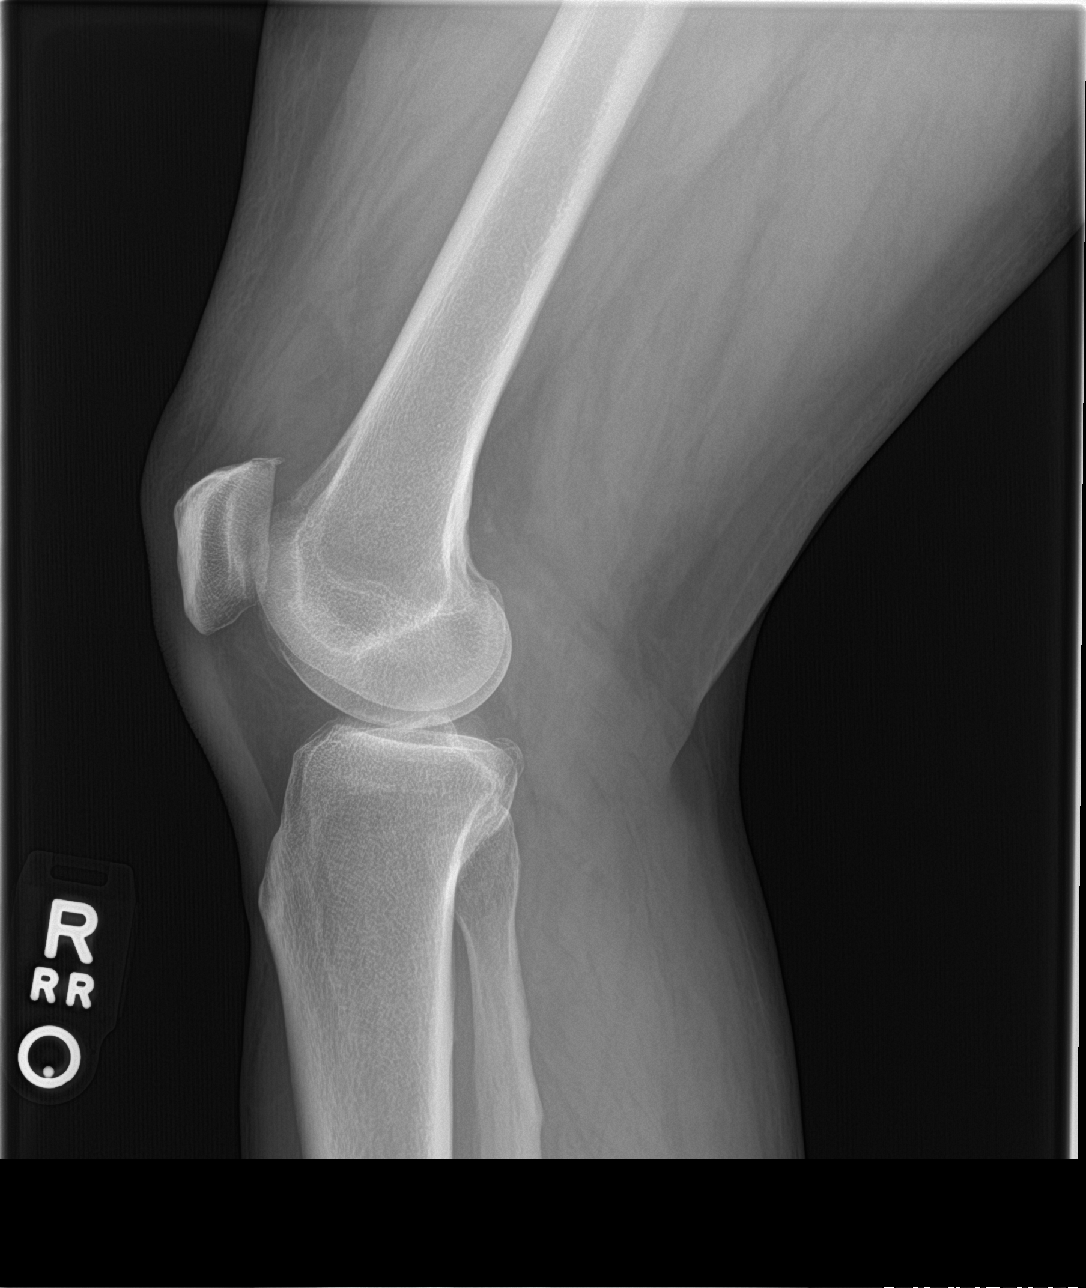

[knee sunrise]
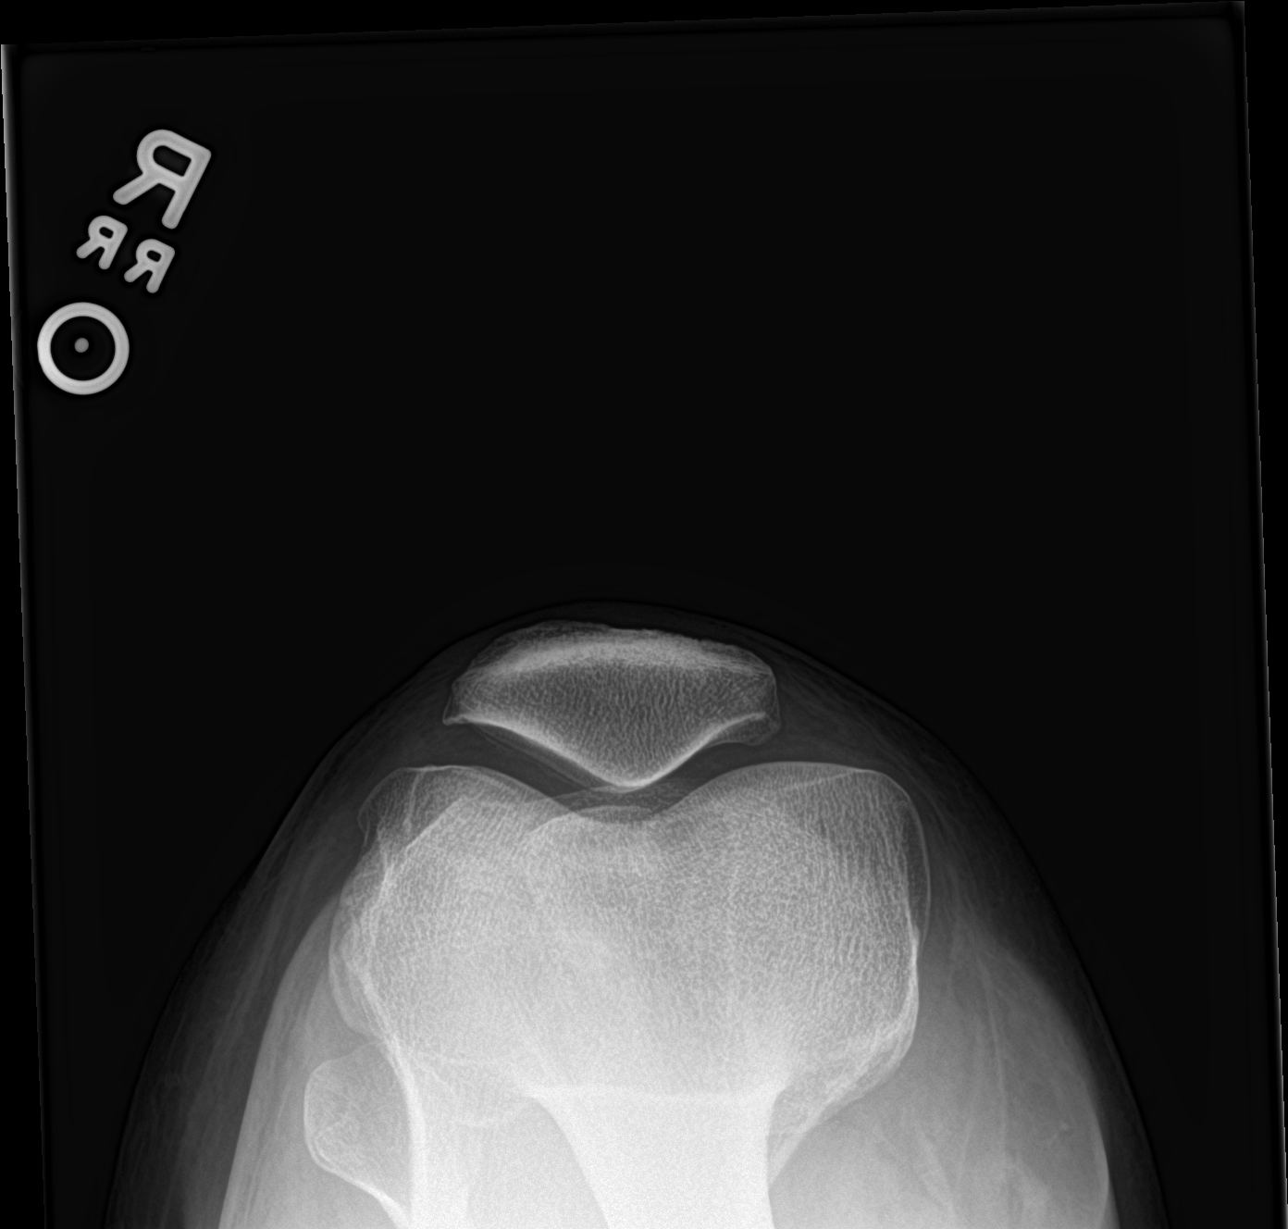

[4 of 4 positions shown; findings below may reference images not displayed]

FINDINGS: Trace suprapatellar right knee joint effusion. No fracture,
malalignment or suspicious focal osseous lesion. Small superior
right patellar osteophyte, unchanged. Sharpening of the tibial
spines, unchanged. Tiny marginal central osteophytes in the medial
and lateral compartments without appreciable joint space narrowing.
No joint space narrowing in the patellofemoral compartment.
IMPRESSION: 1. Trace suprapatellar right knee joint effusion. No fracture or
malalignment in the right knee.
2. Stable minimal tricompartmental osteoarthritis in the right knee.

## 2017-03-01 ENCOUNTER — Encounter: Payer: Self-pay | Admitting: Physician Assistant

## 2017-03-01 ENCOUNTER — Ambulatory Visit (INDEPENDENT_AMBULATORY_CARE_PROVIDER_SITE_OTHER): Payer: Medicare Other | Admitting: Physician Assistant

## 2017-03-01 VITALS — BP 146/69 | HR 88 | Ht 63.0 in | Wt 183.0 lb

## 2017-03-01 DIAGNOSIS — N951 Menopausal and female climacteric states: Secondary | ICD-10-CM

## 2017-03-01 DIAGNOSIS — G629 Polyneuropathy, unspecified: Secondary | ICD-10-CM | POA: Diagnosis not present

## 2017-03-01 HISTORY — DX: Menopausal and female climacteric states: N95.1

## 2017-03-01 MED ORDER — ESTROGENS, CONJUGATED 0.625 MG/GM VA CREA: 1.0000 | TOPICAL_CREAM | Freq: Every day | VAGINAL | 12 refills | Status: DC

## 2017-03-01 MED ORDER — GABAPENTIN 100 MG PO CAPS: ORAL_CAPSULE | ORAL | 1 refills | Status: DC

## 2017-03-01 NOTE — Progress Notes (Signed)
   Subjective:    Patient ID: Janet Richards, female    DOB: April 14, 1961, 56 y.o.   MRN: [id number]  HPI  Pt is a 56 yo female who presents to the clinic with burning and tingling of both feet only at night. It has been occurring for years. Body oils and lotions help some. She was pre-diabetic but sugars have been controlled recently. She denies any cramping or leg pain.   She has noticed some vaginal dryness and pain with intercourse she would like treatment.      Review of Systems  All other systems reviewed and are negative.      Objective:   Physical Exam  Constitutional: She is oriented to person, place, and time. She appears well-developed and well-nourished.  HENT:  Head: Normocephalic and atraumatic.  Cardiovascular: Normal rate, regular rhythm and normal heart sounds.   Pulmonary/Chest: Effort normal and breath sounds normal.  Neurological: She is alert and oriented to person, place, and time.  Skin:  Feet color and pulses are great.  NROM of bilateral feet.   Psychiatric: She has a normal mood and affect. Her behavior is normal.          Assessment & Plan:  Marland KitchenMarland KitchenDiagnoses and all orders for this visit:  Peripheral polyneuropathy -     gabapentin (NEURONTIN) 100 MG capsule; Take 1-3 tablets about 1 hour before bedtime.  Vaginal dryness, menopausal -     conjugated estrogens (PREMARIN) vaginal cream; Place 1 Applicatorful vaginally daily. For first 2 weeks then decrease to 3 times a week.   Unclear etiology of neuropathy. I wonder if a presentation of RLS. Started gabapentin at bedtime. Sided effects discussed.  Follow up in 4-6 weeks.   Cream given for dryness. Start with daily and after 2 weeks decrease to three times a week as needed. With topical no need to be on progesterone therapy with intact uterus.

## 2017-03-03 ENCOUNTER — Encounter: Payer: Self-pay | Admitting: Physician Assistant

## 2017-03-20 ENCOUNTER — Other Ambulatory Visit: Payer: Self-pay | Admitting: Family Medicine

## 2017-03-27 ENCOUNTER — Encounter: Payer: Self-pay | Admitting: Physician Assistant

## 2017-03-27 ENCOUNTER — Ambulatory Visit (INDEPENDENT_AMBULATORY_CARE_PROVIDER_SITE_OTHER): Payer: Medicare Other | Admitting: Physician Assistant

## 2017-03-27 VITALS — BP 117/84 | HR 87 | Ht 63.0 in | Wt 178.0 lb

## 2017-03-27 DIAGNOSIS — M67471 Ganglion, right ankle and foot: Secondary | ICD-10-CM | POA: Diagnosis not present

## 2017-03-27 NOTE — Patient Instructions (Addendum)
Will make referral.  Wear good supportive shoes, NSAIDs as needed.

## 2017-03-27 NOTE — Progress Notes (Signed)
   Subjective:    Patient ID: Janet Richards, female    DOB: [birth year], 56 y.o.   MRN: [id number]  HPI  Pt is a 56 yo female who presents to the clinic to discuss right foot pain and tingling. She was seen by sports medicine provider and told it was a ganglion cyst and not to do anything unless started giving her problems. For the last 2 months she feels like there is pain where cyst is and more pain with bearing weight and walking. Yesterday her right foot was throbbing. She has had problems with pain, numbness, tingling in both feet but left resolved with gabapentin.     Review of Systems    see HPI.  Objective:   Physical Exam  Constitutional: She is oriented to person, place, and time. She appears well-developed and well-nourished.  HENT:  Head: Normocephalic and atraumatic.  Musculoskeletal:  Right foot-small palpable nodule just distal to lateral malleolus. Area just above nodule of edema. No warmth, redness. There was some tenderness to palpation over palpable nodule.   Neurological: She is alert and oriented to person, place, and time.  Psychiatric: She has a normal mood and affect. Her behavior is normal.          Assessment & Plan:  Marland KitchenMarland KitchenAryona was seen today for abnormal urterine bleeding.  Diagnoses and all orders for this visit:  Ganglion cyst of right foot -     Ambulatory referral to Podiatry   Encouraged good supportive shoe. NSAIDs as needed. Ice as needed. Will make referral.

## 2017-04-01 ENCOUNTER — Emergency Department (INDEPENDENT_AMBULATORY_CARE_PROVIDER_SITE_OTHER)
Admission: EM | Admit: 2017-04-01 | Discharge: 2017-04-01 | Disposition: A | Discharge status: Home / Self Care | Payer: Medicare Other | Source: Home / Self Care | Attending: Family Medicine | Admitting: Family Medicine

## 2017-04-01 ENCOUNTER — Emergency Department (INDEPENDENT_AMBULATORY_CARE_PROVIDER_SITE_OTHER): Payer: Medicare Other

## 2017-04-01 ENCOUNTER — Encounter: Payer: Self-pay | Admitting: Emergency Medicine

## 2017-04-01 DIAGNOSIS — R140 Abdominal distension (gaseous): Secondary | ICD-10-CM | POA: Diagnosis not present

## 2017-04-01 DIAGNOSIS — R1010 Upper abdominal pain, unspecified: Secondary | ICD-10-CM | POA: Diagnosis not present

## 2017-04-01 DIAGNOSIS — R110 Nausea: Secondary | ICD-10-CM | POA: Diagnosis not present

## 2017-04-01 DIAGNOSIS — R1084 Generalized abdominal pain: Secondary | ICD-10-CM

## 2017-04-01 LAB — POCT CBC W AUTO DIFF (K'VILLE URGENT CARE)

## 2017-04-01 LAB — POCT URINALYSIS DIP (MANUAL ENTRY)
Bilirubin, UA: NEGATIVE
Glucose, UA: NEGATIVE mg/dL
Ketones, POC UA: NEGATIVE mg/dL
Leukocytes, UA: NEGATIVE
Nitrite, UA: NEGATIVE
Protein Ur, POC: NEGATIVE mg/dL
Spec Grav, UA: 1.02 (ref 1.010–1.025)
Urobilinogen, UA: 0.2 E.U./dL
pH, UA: 7 (ref 5.0–8.0)

## 2017-04-01 LAB — LIPASE: Lipase: 13 U/L (ref 7–60)

## 2017-04-01 LAB — COMPLETE METABOLIC PANEL WITH GFR
ALT: 15 U/L (ref 6–29)
AST: 14 U/L (ref 10–35)
Albumin: 4.4 g/dL (ref 3.6–5.1)
Alkaline Phosphatase: 64 U/L (ref 33–130)
BUN: 12 mg/dL (ref 7–25)
CO2: 29 mmol/L (ref 20–31)
Calcium: 9.7 mg/dL (ref 8.6–10.4)
Chloride: 99 mmol/L (ref 98–110)
Creat: 0.93 mg/dL (ref 0.50–1.05)
GFR, Est African American: 81 mL/min (ref >=60)
GFR, Est Non African American: 70 mL/min (ref >=60)
Glucose, Bld: 95 mg/dL (ref 65–99)
Potassium: 3.7 mmol/L (ref 3.5–5.3)
Sodium: 139 mmol/L (ref 135–146)
Total Bilirubin: 0.5 mg/dL (ref 0.2–1.2)
Total Protein: 6.9 g/dL (ref 6.1–8.1)

## 2017-04-01 LAB — AMYLASE: Amylase: 37 U/L (ref 21–101)

## 2017-04-01 NOTE — ED Triage Notes (Signed)
Pt c/o feeling bloated, upper stomach pain x 1 day, nausea, no vomitting, no diarrhea, no urgency or frequency.

## 2017-04-01 NOTE — Discharge Instructions (Signed)
Begin clear liquids for about 18 to 24 hours, then may begin a BRAT diet (Bananas, Rice, Applesauce, Toast) when pain improved.  Then gradually advance to a regular diet as tolerated.  May take Tylenol as needed for pain.    If symptoms become significantly worse during the night or over the weekend, proceed to the local emergency room.

## 2017-04-01 NOTE — ED Provider Notes (Signed)
[name]    CSN: [id number] Arrival date & time: 04/01/17  7412     History   Chief Complaint Chief Complaint  Patient presents with  . Abdominal Pain    HPI Janet Richards is a 56 y.o. female.   While driving back home yesterday from the [place] with her husband, patient noticed persistent mild diffuse abdominal pain.  She ate several snacks through the day and experienced abdominal bloating yesterday evening.  She had a brief episode of nausea without vomiting that resolved.  Her symptoms did not improve after taking Protonix.  She had a normal bowel movement yesterday.  No fevers, chills, and sweats.  She notes that she had had intercourse with her husband during her trip, and she believes that her abdominal pain could be musculoskeletal.  No pelvic pain or vaginal discharge.  No urinary symptoms.  Past history of tubal ligation 1989.  She states that she has had a colonoscopy in the past 1.5 to 2 years that showed minimal diverticulosis. Review of lab results reveals negative PAP smear December 2017.   The history is provided by the patient.    Past Medical History:  Diagnosis Date  . Essential hypertension, benign   . Menstrual migraine   . Primary osteoarthritis of both knees   . Urticaria     Patient Active Problem List   Diagnosis Date Noted  . Vaginal dryness, menopausal 03/01/2017  . Neuropathy 03/01/2017  . Abnormal ankle brachial index (ABI) 11/09/2016  . Burning sensation of foot 09/02/2016  . Ganglion cyst of right foot 07/12/2016  . Chronic dental infection 04/11/2016  . Anemia, iron deficiency 02/25/2016  . Right ankle pain 02/24/2016  . Fatigue 02/24/2016  . Paresthesias 07/01/2015  . History of colonic polyps 03/20/2015  . Hyperlipidemia 02/20/2015  . Chronic allergic rhinitis 01/14/2015  . IFG (impaired fasting glucose) 10/17/2014  . Osteoarthritis of first metatarsophalangeal joint 04/11/2014  . 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 No blood products 03/28/2013  . Essential hypertension, benign 03/01/2013  . Exertional chest pain 02/05/2013  . Abnormal uterine bleeding 02/05/2013  . Primary osteoarthritis of both knees 12/27/2012    Past Surgical History:  Procedure Laterality Date  . TUBAL LIGATION  12/26/1987    OB History    Gravida Para Term Preterm AB Living   5 3 3   2 3    SAB TAB Ectopic Multiple Live Births     2             Home Medications    Prior to Admission medications   Medication Sig Start Date End Date Taking? Authorizing Provider  albuterol (PROVENTIL HFA;VENTOLIN HFA) 108 (90 Base) MCG/ACT inhaler Inhale two puffs every 4-6 hours only as needed for shortness of breath or wheezing. 01/19/16 01/18/17  [name], MD  cetirizine HCl (ZYRTEC) 1 MG/ML solution TAKE 10 MLS (10 MG TOTAL) BY MOUTH DAILY. 03/20/17   Breeback, Luvenia Starch L, PA-C  conjugated estrogens (PREMARIN) vaginal cream Place 1 Applicatorful vaginally daily. For first 2 weeks then decrease to 3 times a week. 03/01/17   Breeback, Royetta Car, PA-C  cyclobenzaprine (FLEXERIL) 10 MG tablet Take a half to a full tab every 8-12 hours only as needed for muscle spasm, may cause sedation. 08/08/13   Hommel, Hilliard Clark, DO  gabapentin (NEURONTIN) 100 MG capsule Take 1-3 tablets about 1 hour before bedtime. 03/01/17   Breeback, Jade L, PA-C  hydrochlorothiazide (HYDRODIURIL) 25 MG tablet TAKE 1 TABLET (25  MG TOTAL) BY MOUTH DAILY. 01/18/17   [name], MD  montelukast (SINGULAIR) 10 MG tablet Take 10 mg by mouth at bedtime.  04/04/13   [provider]  pantoprazole (PROTONIX) 40 MG tablet TAKE 1 TABLET (40 MG TOTAL) BY MOUTH DAILY. 11/08/16   [name], MD    Family History Family History  Problem Relation Age of Onset  . Hypertension Mother   . CAD Father        MI in his 52s    Social History Social History  Substance Use Topics  . Smoking status: Former Smoker    Packs/day: 1.50    Types: Cigarettes    Quit date: 02/05/2006  .  Smokeless tobacco: Never Used  . Alcohol use Yes     Comment: rarely     Allergies   Azithromycin; Clindamycin/lincomycin; Erythromycin; Metronidazole; Nsaids; and Sulfa antibiotics   Review of Systems Review of Systems  Constitutional: Negative for activity change, appetite change, chills, diaphoresis, fatigue and fever.  HENT: Negative.   Eyes: Negative.   Cardiovascular: Negative.   Gastrointestinal: Positive for abdominal distention, abdominal pain and nausea. Negative for anal bleeding, blood in stool, constipation, diarrhea, rectal pain and vomiting.  Genitourinary: Negative.   Musculoskeletal: Negative.   Skin: Negative.   Neurological: Negative for headaches.     Physical Exam Triage Vital Signs ED Triage Vitals  Enc Vitals Group     BP 04/01/17 1020 104/78     Pulse Rate 04/01/17 1020 74     Resp --      Temp 04/01/17 1020 98.4 F (36.9 C)     Temp Source 04/01/17 1020 Oral     SpO2 04/01/17 1020 99 %     Weight 04/01/17 1021 177 lb 8 oz (80.5 kg)     Height 04/01/17 1021 5' 3.5" (1.613 m)     Head Circumference --      Peak Flow --      Pain Score 04/01/17 1021 5     Pain Loc --      Pain Edu? --      Excl. in [place]? --    No data found.   Updated Vital Signs BP 104/78 (BP Location: Left Arm)   Pulse 74   Temp 98.4 F (36.9 C) (Oral)   Ht 5' 3.5" (1.613 m)   Wt 177 lb 8 oz (80.5 kg)   LMP 03/23/2017 (Exact Date)   SpO2 99%   BMI 30.95 kg/m   Visual Acuity Right Eye Distance:   Left Eye Distance:   Bilateral Distance:    Right Eye Near:   Left Eye Near:    Bilateral Near:     Physical Exam  Constitutional: She appears well-developed and well-nourished. No distress.  HENT:  Head: Normocephalic.  Right Ear: External ear normal.  Left Ear: External ear normal.  Nose: Nose normal.  Mouth/Throat: Oropharynx is clear and moist.  Eyes: Conjunctivae are normal. Pupils are equal, round, and reactive to light.  Neck: Neck supple.    Pulmonary/Chest: Breath sounds normal.  Abdominal: Soft. Normal appearance and bowel sounds are normal. She exhibits no distension and no mass. There is no hepatosplenomegaly. There is generalized tenderness. There is no rigidity, no guarding, no CVA tenderness, no tenderness at McBurney's point and negative Murphy's sign. No hernia.    Diffuse mild abdominal pain, more prominent peri-umbilical and lower quadrants.  Tenderness with flexion of abdominus rectus muscles.  Lymphadenopathy:    She has  no cervical adenopathy.  Neurological: She is alert.  Skin: Skin is warm and dry. No rash noted.  Nursing note and vitals reviewed.    UC Treatments / Results  Labs (all labs ordered are listed, but only abnormal results are displayed) Labs Reviewed  POCT URINALYSIS DIP (MANUAL ENTRY) - Abnormal; Notable for the following:       Result Value   Color, UA light yellow (*)    Blood, UA small (*)    All other components within normal limits  COMPLETE METABOLIC PANEL WITH GFR   Narrative:    Performed at:  [address], [address]                [place], [place] [address]  AMYLASE   Narrative:    Performed at:  [address], [address]                [place], [place] [address]  LIPASE   Narrative:    Performed at:  [address], [address]                [place], [place] [address]  POCT CBC W AUTO DIFF ([place]):  WBC 5.8; LY 49.6; MO 4.2; GR 46.2; Hgb 13.0; Platelets 351     EKG  EKG Interpretation None       Radiology Dg Abdomen 1 View  Result Date: 04/01/2017 CLINICAL DATA:  Bloated, nausea, and upper abdominal pain since yesterday, diffuse abdominal pain for 1 day, history hypertension EXAM: ABDOMEN - 1 VIEW COMPARISON:  None ; correlation CT abdomen and pelvis 04/26/2013 FINDINGS: Normal bowel gas pattern. No bowel dilatation or bowel wall thickening. Few  pelvic phleboliths. No urine tract calcification. Degenerative changes lower lumbar spine and question sacroiliitis changes bilaterally. IMPRESSION: Normal bowel gas pattern. Electronically Signed   By: Lavonia Dana M.D.   On: 04/01/2017 11:34    Procedures Procedures (including critical care time)  Medications Ordered in UC Medications - No data to display   Initial Impression / Assessment and Plan / UC Course  I have reviewed the triage vital signs and the nursing notes.  Pertinent labs & imaging results that were available during my care of the patient were reviewed by me and considered in my medical decision making (see chart for details).    Negative KUB, normal CBC and negative urinalysis reassuring.  CMP, amylase, lipase pending. Patient's pain may be musculoskeletal in etiology. Begin clear liquids for about 18 to 24 hours, then may begin a BRAT diet (Bananas, Rice, Applesauce, Toast) when pain improved.  Then gradually advance to a regular diet as tolerated.  May take Tylenol as needed for pain.    If symptoms become significantly worse during the night or over the weekend, proceed to the local emergency room.  Followup with Family Doctor if not improved in 3 to 4 days.    Final Clinical Impressions(s) / UC Diagnoses   Final diagnoses:  Generalized abdominal pain    New Prescriptions Discharge Medication List as of 04/01/2017 11:55 AM       Kandra Nicolas, MD 04/02/17 [phone]

## 2017-04-02 ENCOUNTER — Telehealth: Payer: Self-pay | Admitting: Emergency Medicine

## 2017-04-02 NOTE — Telephone Encounter (Signed)
Pt informed of results.  Advised all labs were normal, states she is actually feeling better.  Will f/u prn.  TMartin,CMA

## 2017-04-10 ENCOUNTER — Ambulatory Visit (INDEPENDENT_AMBULATORY_CARE_PROVIDER_SITE_OTHER): Payer: Medicare Other | Admitting: Physician Assistant

## 2017-04-10 ENCOUNTER — Encounter: Payer: Self-pay | Admitting: Physician Assistant

## 2017-04-10 ENCOUNTER — Ambulatory Visit (INDEPENDENT_AMBULATORY_CARE_PROVIDER_SITE_OTHER): Payer: Medicare Other | Admitting: Podiatry

## 2017-04-10 VITALS — BP 118/75 | HR 89 | Ht 63.0 in | Wt 178.0 lb

## 2017-04-10 VITALS — BP 118/75 | HR 89 | Ht 63.5 in

## 2017-04-10 DIAGNOSIS — M21969 Unspecified acquired deformity of unspecified lower leg: Secondary | ICD-10-CM

## 2017-04-10 DIAGNOSIS — M216X9 Other acquired deformities of unspecified foot: Secondary | ICD-10-CM

## 2017-04-10 DIAGNOSIS — K0381 Cracked tooth: Secondary | ICD-10-CM | POA: Diagnosis not present

## 2017-04-10 DIAGNOSIS — M722 Plantar fascial fibromatosis: Secondary | ICD-10-CM | POA: Diagnosis not present

## 2017-04-10 DIAGNOSIS — B379 Candidiasis, unspecified: Secondary | ICD-10-CM

## 2017-04-10 DIAGNOSIS — K047 Periapical abscess without sinus: Secondary | ICD-10-CM | POA: Diagnosis not present

## 2017-04-10 MED ORDER — FLUCONAZOLE 150 MG PO TABS: 150.0000 mg | ORAL_TABLET | Freq: Once | ORAL | 0 refills | Status: AC

## 2017-04-10 MED ORDER — AMOXICILLIN-POT CLAVULANATE 875-125 MG PO TABS: 1.0000 | ORAL_TABLET | Freq: Two times a day (BID) | ORAL | 0 refills | Status: DC

## 2017-04-10 NOTE — Patient Instructions (Signed)
Dental Abscess A dental abscess is pus in or around a tooth. Follow these instructions at home:  Take medicines only as told by your dentist.  If you were prescribed antibiotic medicine, finish all of it even if you start to feel better.  Rinse your mouth (gargle) often with salt water.  Do not drive or use heavy machinery, like a lawn mower, while taking pain medicine.  Do not apply heat to the outside of your mouth.  Keep all follow-up visits as told by your dentist. This is important. Contact a doctor if:  Your pain is worse, and medicine does not help. Get help right away if:  You have a fever or chills.  Your symptoms suddenly get worse.  You have a very bad headache.  You have problems breathing or swallowing.  You have trouble opening your mouth.  You have puffiness (swelling) in your neck or around your eye. This information is not intended to replace advice given to you by your health care provider. Make sure you discuss any questions you have with your health care provider. Document Released: 03/10/2015 Document Revised: 03/31/2016 Document Reviewed: 10/21/2014 Elsevier Interactive Patient Education  2018 [employer] Inc[employer]  

## 2017-04-10 NOTE — Patient Instructions (Signed)
Seen for pain in R>L. Noted of weak first metatarsal bone with excess Subtalar joint pronation. Need custom orthotics and will do X-ray on next visit.

## 2017-04-10 NOTE — Progress Notes (Signed)
Subjective:    Patient ID: Janet Richards, female    DOB: [birth year], 56 y.o.   MRN: [id number]  HPI  Pt is a 56 yo female with poor dentition who presents to the clinic with left upper molar tooth pain. She has a cracked tooth that has started to become increasingly painful. She would rate the pain 5/10. It is worse "when air hits it". She has planns to have all cracked teeth removed and get a partial. She is having trouble eating and drinking due to pain. No fever, chills, nausea. She complains of "just not feeling good they wasy she gets when she has a tooth infection".   .. Active Ambulatory Problems    Diagnosis Date Noted  . Primary osteoarthritis of both knees 12/27/2012  . Exertional chest pain 02/05/2013  . Abnormal uterine bleeding 02/05/2013  . Essential hypertension, benign 03/01/2013  . No blood products 03/28/2013  . 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 Osteoarthritis of first metatarsophalangeal joint 04/11/2014  . IFG (impaired fasting glucose) 10/17/2014  . Chronic allergic rhinitis 01/14/2015  . Hyperlipidemia 02/20/2015  . History of colonic polyps 03/20/2015  . Paresthesias 07/01/2015  . Right ankle pain 02/24/2016  . Fatigue 02/24/2016  . Anemia, iron deficiency 02/25/2016  . Chronic dental infection 04/11/2016  . Ganglion cyst of right foot 07/12/2016  . Burning sensation of foot 09/02/2016  . Abnormal ankle brachial index (ABI) 11/09/2016  . Vaginal dryness, menopausal 03/01/2017  . Neuropathy 03/01/2017   Resolved Ambulatory Problems    Diagnosis Date Noted  . Allergic urticaria 12/27/2012  . Dental abscess 02/05/2013  . Menstrual migraine 02/05/2013  . Right lumbar radiculitis 04/19/2013  . Hyperglycemia 08/05/2013  . Allergic rhinitis 01/22/2014  . Left knee pain 02/25/2014  . Dental infection 03/20/2014  . Allergic sinusitis 01/14/2015  . Pain, dental 01/14/2015  . Sinusitis 06/26/2015  . Chest pain 07/08/2015  . Right knee injury 07/15/2015   . Neck pain 09/01/2015  . UTI (urinary tract infection) 11/25/2015  . Acute upper respiratory infection 12/22/2015  . Low blood potassium 05/24/2016  . Elevated lipase 05/24/2016  . Insect bite 05/25/2016   Past Medical History:  Diagnosis Date  . Essential hypertension, benign   . Menstrual migraine   . Primary osteoarthritis of both knees   . Urticaria       Review of Systems  All other systems reviewed and are negative.      Objective:   Physical Exam  Constitutional: She appears well-developed and well-nourished.  HENT:  Head: Normocephalic and atraumatic.  Mouth/Throat:    Overall poor dentition with multiple missing teeth.   Cardiovascular: Normal rate, regular rhythm and normal heart sounds.   Psychiatric: She has a normal mood and affect. Her behavior is normal.          Assessment & Plan:  Marland KitchenMarland KitchenDiagnoses and all orders for this visit:  Dental infection -     amoxicillin-clavulanate (AUGMENTIN) 875-125 MG tablet; Take 1 tablet by mouth 2 (two) times daily. For 10 days.  Broken or cracked tooth, nontraumatic -     amoxicillin-clavulanate (AUGMENTIN) 875-125 MG tablet; Take 1 tablet by mouth 2 (two) times daily. For 10 days.  Yeast infection -     fluconazole (DIFLUCAN) 150 MG tablet; Take 1 tablet (150 mg total) by mouth once. Repeat in 48 hours if symptoms persist.   Treated with abx. Diflucan sent for hx of yeast infections after abx. Discussed removing teeth will be the only cure. encouraged  salt water soaks. Encouraged Ibuprofen/tylenol for pain control.

## 2017-04-10 NOTE — Progress Notes (Signed)
SUBJECTIVE: 56 y.o. year old female presents complaining of pain and cyst over right ankle duration of a year. Burning pain on bottom, top and side of ankle right foot.  Foot burns and hurts in arch, heel, top and side of ankle area to walk in barefoot.   Positive history of gum infection and been taking teeth out one by one for the last year and a half..  Foot pain has been on for over a year and a half.  She has a palpable knot on right lateral ankle from an old injury, sprained/twisted ankle.   REVIEW OF SYSTEMS: Pertinent items noted in HPI and remainder of comprehensive ROS otherwise negative.  OBJECTIVE: DERMATOLOGIC EXAMINATION: No abnormal skin lesions noted.  VASCULAR EXAMINATION OF LOWER LIMBS: Pedal pulses: All pedal pulses are palpable with normal pulsation.  Capillary Filling times within 3 seconds in all digits.  No edema or erythema noted. Temperature gradient from tibial crest to dorsum of foot is within normal bilateral.  NEUROLOGIC EXAMINATION OF THE LOWER LIMBS: All epicritic and tactile sensations grossly intact. Sharp and Dull discriminatory sensations at the plantar ball of hallux is intact bilateral.   MUSCULOSKELETAL EXAMINATION: Positive for hypermobile first ray bilateral. STJ hyperpronation bilateral. Flexible flat foot bilateral.  Palpable knot, asymptomatic a pea size over lateral collateral ligament right ankle.  ASSESSMENT: Tenosynovitis right foot. Plantar fasciitis right foot. Metatarsal deformity bilateral. STJ hyperpronation bilateral.  PLAN: Reviewed clinical findings and available treatment options. Advised benefit of custom orthotics as apart of conservative treatment. Proper shoe selection reviewed. Patient will return for custom orthotics.

## 2017-04-11 ENCOUNTER — Encounter: Payer: Self-pay | Admitting: Podiatry

## 2017-04-14 ENCOUNTER — Telehealth: Payer: Self-pay

## 2017-04-14 NOTE — Telephone Encounter (Signed)
Patient advised of recommendations.  

## 2017-04-14 NOTE — Telephone Encounter (Signed)
Benny states she was told by her insurance company she can get a TENS unit to help relieve some of her pain. Please advise.

## 2017-04-14 NOTE — Telephone Encounter (Signed)
We usually suggest to get on [facility] and then you can submit receipt to insurance company. You can also call our PT department in office and see if they have a way to order through them.

## 2017-04-27 ENCOUNTER — Other Ambulatory Visit: Payer: Self-pay | Admitting: Physician Assistant

## 2017-04-27 ENCOUNTER — Emergency Department (INDEPENDENT_AMBULATORY_CARE_PROVIDER_SITE_OTHER)
Admission: EM | Admit: 2017-04-27 | Discharge: 2017-04-27 | Disposition: A | Discharge status: Home / Self Care | Payer: Medicare Other | Source: Home / Self Care | Attending: Family Medicine | Admitting: Family Medicine

## 2017-04-27 ENCOUNTER — Encounter: Payer: Self-pay | Admitting: *Deleted

## 2017-04-27 DIAGNOSIS — K029 Dental caries, unspecified: Secondary | ICD-10-CM

## 2017-04-27 HISTORY — DX: Chronic periodontitis, unspecified: K05.30

## 2017-04-27 MED ORDER — AMOXICILLIN 500 MG PO CAPS: 500.0000 mg | ORAL_CAPSULE | Freq: Three times a day (TID) | ORAL | 0 refills | Status: DC

## 2017-04-27 NOTE — ED Provider Notes (Signed)
CSN: [id number]     Arrival date & time 04/27/17  1201 History   First MD Initiated Contact with Patient 04/27/17 1222     Chief Complaint  Patient presents with  . Dental Pain   (Consider location/radiation/quality/duration/timing/severity/associated sxs/prior Treatment) HPI Janet Richards is a 56 y.o. female presenting to UC with c/o Left upper tooth pain that has gradually been worsening over the last 1 month. Hx of poor dentition and multiple pulled teeth. Current tooth does have a filling but is very sensitive to cool things including the air touching it.  She cannot have it pulled by her dentist until July 3rd due to financial reasons but is request an antibiotic in the meantime. Denies fever, chills, n/v/d but does have a mild headache and sore lymph nodes around her ears and neck that she states feels similar to prior dental infections.   Past Medical History:  Diagnosis Date  . Essential hypertension, benign   . Menstrual migraine   . Periodontitis   . Primary osteoarthritis of both knees   . Urticaria    Past Surgical History:  Procedure Laterality Date  . TUBAL LIGATION  12/26/1987   Family History  Problem Relation Age of Onset  . Hypertension Mother   . CAD Father        MI in his 12s   Social History  Substance Use Topics  . Smoking status: Former Smoker    Packs/day: 1.50    Types: Cigarettes    Quit date: 02/05/2006  . Smokeless tobacco: Never Used  . Alcohol use Yes     Comment: rarely   OB History    Gravida Para Term Preterm AB Living   5 3 3   2 3    SAB TAB Ectopic Multiple Live Births     2           Review of Systems  Constitutional: Negative for chills and fever.  HENT: Positive for dental problem. Negative for ear pain, rhinorrhea and sore throat.   Gastrointestinal: Negative for nausea and vomiting.  Neurological: Positive for headaches. Negative for dizziness and light-headedness.    Allergies  Azithromycin; Clindamycin/lincomycin;  Erythromycin; Metronidazole; Nsaids; and Sulfa antibiotics  Home Medications   Prior to Admission medications   Medication Sig Start Date End Date Taking? Authorizing Provider  albuterol (PROVENTIL HFA;VENTOLIN HFA) 108 (90 Base) MCG/ACT inhaler Inhale two puffs every 4-6 hours only as needed for shortness of breath or wheezing. 01/19/16 01/18/17  [name], MD  amoxicillin (AMOXIL) 500 MG capsule Take 1 capsule (500 mg total) by mouth 3 (three) times daily. 04/27/17   Noe Gens, PA-C  cetirizine HCl (ZYRTEC) 1 MG/ML solution TAKE 10 MLS (10 MG TOTAL) BY MOUTH DAILY. 03/20/17   Breeback, Luvenia Starch L, PA-C  conjugated estrogens (PREMARIN) vaginal cream Place 1 Applicatorful vaginally daily. For first 2 weeks then decrease to 3 times a week. 03/01/17   Breeback, Royetta Car, PA-C  cyclobenzaprine (FLEXERIL) 10 MG tablet Take a half to a full tab every 8-12 hours only as needed for muscle spasm, may cause sedation. 08/08/13   Hommel, Hilliard Clark, DO  gabapentin (NEURONTIN) 100 MG capsule Take 1-3 tablets about 1 hour before bedtime. 03/01/17   Breeback, Jade L, PA-C  hydrochlorothiazide (HYDRODIURIL) 25 MG tablet TAKE 1 TABLET (25 MG TOTAL) BY MOUTH DAILY. 01/18/17   [name], MD  montelukast (SINGULAIR) 10 MG tablet Take 10 mg by mouth at bedtime.  04/04/13   [provider]  pantoprazole (PROTONIX) 40 MG tablet TAKE 1 TABLET (40 MG TOTAL) BY MOUTH DAILY. 11/08/16   [name], MD   Meds Ordered and Administered this Visit  Medications - No data to display  BP 132/84 (BP Location: Left Arm)   Pulse 81   Temp 97.8 F (36.6 C) (Oral)   Wt 184 lb (83.5 kg)   LMP 04/15/2017   SpO2 100%   BMI 32.59 kg/m  No data found.   Physical Exam  Constitutional: She is oriented to person, place, and time. She appears well-developed and well-nourished. No distress.  HENT:  Head: Normocephalic and atraumatic.  Mouth/Throat: Uvula is midline, oropharynx is clear and moist and mucous membranes are normal.  No oral lesions. Abnormal dentition. Dental caries present. No dental abscesses or uvula swelling.    Left upper tooth- filling in place. Stable but tender to light touch. Mild gingival edema and erythema w/o bleeding or discharge  Eyes: EOM are normal.  Neck: Normal range of motion.  Cardiovascular: Normal rate.   Pulmonary/Chest: Effort normal.  Musculoskeletal: Normal range of motion.  Neurological: She is alert and oriented to person, place, and time.  Skin: Skin is warm and dry. She is not diaphoretic.  Psychiatric: She has a normal mood and affect. Her behavior is normal.  Nursing note and vitals reviewed.   Urgent Care Course     Procedures (including critical care time)  Labs Review Labs Reviewed - No data to display  Imaging Review No results found.    MDM   1. Pain due to dental caries    Rx: Amoxicillin F/u with dentist as planned for July 3rd    Noe Gens, [place] 04/27/17 1253

## 2017-04-27 NOTE — ED Triage Notes (Signed)
Patient c/o left upper dental pain x 1 month. She plan to have the tooth pulled in 1 1/2 weeks.

## 2017-05-14 ENCOUNTER — Emergency Department (INDEPENDENT_AMBULATORY_CARE_PROVIDER_SITE_OTHER)
Admission: EM | Admit: 2017-05-14 | Discharge: 2017-05-14 | Disposition: A | Discharge status: Home / Self Care | Payer: Medicare Other | Source: Home / Self Care | Attending: Family Medicine | Admitting: Family Medicine

## 2017-05-14 ENCOUNTER — Encounter: Payer: Self-pay | Admitting: Emergency Medicine

## 2017-05-14 DIAGNOSIS — K029 Dental caries, unspecified: Secondary | ICD-10-CM

## 2017-05-14 DIAGNOSIS — K0889 Other specified disorders of teeth and supporting structures: Secondary | ICD-10-CM | POA: Diagnosis not present

## 2017-05-14 MED ORDER — AMOXICILLIN-POT CLAVULANATE 875-125 MG PO TABS: 1.0000 | ORAL_TABLET | Freq: Two times a day (BID) | ORAL | 0 refills | Status: DC

## 2017-05-14 NOTE — ED Provider Notes (Signed)
[name]    CSN: [id number] Arrival date & time: 05/14/17  1407     History   Chief Complaint Chief Complaint  Patient presents with  . Dental Pain    HPI Janet Richards is a 56 y.o. female.   Patient reports that she finished amoxicillin from her previous visit, and her toothache resolved.  However, she was unable to visit her dentist on July 3, and her toothache has recurred during the past week.  She now also has a painful cracked tooth in her right upper jaw.  She has had fever during the past two days.    The history is provided by the patient.  Dental Pain  Location:  Upper Upper teeth location:  3/RU 1st molar and 11/LU cuspid Quality:  Aching Severity:  Mild Onset quality:  Gradual Duration:  1 week Progression:  Worsening Chronicity:  Recurrent Context: dental caries, dental fracture and poor dentition   Relieved by:  Nothing Worsened by:  Cold food/drink Ineffective treatments:  NSAIDs Associated symptoms: facial pain, fever, gum swelling and headaches   Associated symptoms: no congestion, no difficulty swallowing, no drooling, no facial swelling, no neck pain, no neck swelling, no oral bleeding, no oral lesions and no trismus   Fever:    Duration:  2 days   Timing:  Intermittent   Past Medical History:  Diagnosis Date  . Essential hypertension, benign   . Menstrual migraine   . Periodontitis   . Primary osteoarthritis of both knees   . Urticaria     Patient Active Problem List   Diagnosis Date Noted  . Vaginal dryness, menopausal 03/01/2017  . Neuropathy 03/01/2017  . Abnormal ankle brachial index (ABI) 11/09/2016  . Burning sensation of foot 09/02/2016  . Ganglion cyst of right foot 07/12/2016  . Chronic dental infection 04/11/2016  . Anemia, iron deficiency 02/25/2016  . Right ankle pain 02/24/2016  . Fatigue 02/24/2016  . Paresthesias 07/01/2015  . History of colonic polyps 03/20/2015  . Hyperlipidemia 02/20/2015  .  Chronic allergic rhinitis 01/14/2015  . IFG (impaired fasting glucose) 10/17/2014  . Osteoarthritis of first metatarsophalangeal joint 04/11/2014  . 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 No blood products 03/28/2013  . Essential hypertension, benign 03/01/2013  . Exertional chest pain 02/05/2013  . Abnormal uterine bleeding 02/05/2013  . Primary osteoarthritis of both knees 12/27/2012    Past Surgical History:  Procedure Laterality Date  . TUBAL LIGATION  12/26/1987    OB History    Gravida Para Term Preterm AB Living   5 3 3   2 3    SAB TAB Ectopic Multiple Live Births     2             Home Medications    Prior to Admission medications   Medication Sig Start Date End Date Taking? Authorizing Provider  albuterol (PROVENTIL HFA;VENTOLIN HFA) 108 (90 Base) MCG/ACT inhaler Inhale two puffs every 4-6 hours only as needed for shortness of breath or wheezing. 01/19/16 01/18/17  [name], MD  amoxicillin (AMOXIL) 500 MG capsule Take 1 capsule (500 mg total) by mouth 3 (three) times daily. 04/27/17   Noe Gens, PA-C  amoxicillin-clavulanate (AUGMENTIN) 875-125 MG tablet Take 1 tablet by mouth 2 (two) times daily. Take with food 05/14/17   Kandra Nicolas, MD  cetirizine HCl (ZYRTEC) 1 MG/ML solution TAKE 10 MLS (10 MG TOTAL) BY MOUTH DAILY. 03/20/17   Breeback, Luvenia Starch L, PA-C  conjugated estrogens (PREMARIN) vaginal  cream Place 1 Applicatorful vaginally daily. For first 2 weeks then decrease to 3 times a week. 03/01/17   Breeback, Royetta Car, PA-C  cyclobenzaprine (FLEXERIL) 10 MG tablet Take a half to a full tab every 8-12 hours only as needed for muscle spasm, may cause sedation. 08/08/13   Hommel, Hilliard Clark, DO  gabapentin (NEURONTIN) 100 MG capsule Take 1-3 tablets about 1 hour before bedtime. 03/01/17   Breeback, Jade L, PA-C  hydrochlorothiazide (HYDRODIURIL) 25 MG tablet TAKE 1 TABLET (25 MG TOTAL) BY MOUTH DAILY. 01/18/17   [name], MD  montelukast (SINGULAIR) 10 MG tablet Take 10 mg  by mouth at bedtime.  04/04/13   [provider]  pantoprazole (PROTONIX) 40 MG tablet TAKE 1 TABLET (40 MG TOTAL) BY MOUTH DAILY. 04/28/17   [name], PA-C    Family History Family History  Problem Relation Age of Onset  . Hypertension Mother   . CAD Father        MI in his 81s    Social History Social History  Substance Use Topics  . Smoking status: Former Smoker    Packs/day: 1.50    Types: Cigarettes    Quit date: 02/05/2006  . Smokeless tobacco: Never Used  . Alcohol use Yes     Comment: rarely     Allergies   Azithromycin; Clindamycin/lincomycin; Erythromycin; Metronidazole; Nsaids; and Sulfa antibiotics   Review of Systems Review of Systems  Constitutional: Positive for fever.  HENT: Negative for congestion, drooling, facial swelling and mouth sores.   Musculoskeletal: Negative for neck pain.  Neurological: Positive for headaches.  All other systems reviewed and are negative.    Physical Exam Triage Vital Signs ED Triage Vitals  Enc Vitals Group     BP 05/14/17 1429 (!) 146/105     Pulse Rate 05/14/17 1429 80     Resp --      Temp 05/14/17 1429 98.2 F (36.8 C)     Temp Source 05/14/17 1429 Oral     SpO2 05/14/17 1429 100 %     Weight 05/14/17 1429 184 lb (83.5 kg)     Height --      Head Circumference --      Peak Flow --      Pain Score 05/14/17 1430 6     Pain Loc --      Pain Edu? --      Excl. in [place]? --    No data found.   Updated Vital Signs BP (!) 146/105 (BP Location: Left Arm)   Pulse 80   Temp 98.2 F (36.8 C) (Oral)   Wt 184 lb (83.5 kg)   LMP 04/15/2017   SpO2 100%   BMI 32.59 kg/m   Visual Acuity Right Eye Distance:   Left Eye Distance:   Bilateral Distance:    Right Eye Near:   Left Eye Near:    Bilateral Near:     Physical Exam  Constitutional: She appears well-developed and well-nourished. She appears distressed.  HENT:  Head: Normocephalic.  Right Ear: External ear normal.  Left Ear: External  ear normal.  Nose: Nose normal.  Mouth/Throat: Oropharynx is clear and moist and mucous membranes are normal. No trismus in the jaw. Abnormal dentition. Dental abscesses and dental caries present. No uvula swelling.    Upper teeth tender to tap as noted on diagram.  There is associated gingival tenderness without swelling or fluctuance.  Eyes: Conjunctivae and EOM are normal. Pupils are equal, round,  and reactive to light.  Neck: Neck supple.  Cardiovascular: Normal rate.   Pulmonary/Chest: Effort normal.  Lymphadenopathy:    She has no cervical adenopathy.  Neurological: She is alert.  Skin: Skin is warm and dry.  Nursing note and vitals reviewed.    UC Treatments / Results  Labs (all labs ordered are listed, but only abnormal results are displayed) Labs Reviewed - No data to display  EKG  EKG Interpretation None       Radiology No results found.  Procedures Procedures (including critical care time)  Medications Ordered in UC Medications - No data to display   Initial Impression / Assessment and Plan / UC Course  I have reviewed the triage vital signs and the nursing notes.  Pertinent labs & imaging results that were available during my care of the patient were reviewed by me and considered in my medical decision making (see chart for details).    Begin Augmentin. Try warm salt water gargles for painful gums and teeth. Followup with dentist as soon as possible.    Final Clinical Impressions(s) / UC Diagnoses   Final diagnoses:  Pain, dental  Dental caries    New Prescriptions New Prescriptions   AMOXICILLIN-CLAVULANATE (AUGMENTIN) 875-125 MG TABLET    Take 1 tablet by mouth 2 (two) times daily. Take with food     Kandra Nicolas, MD 05/14/17 [phone]

## 2017-05-14 NOTE — Discharge Instructions (Signed)
Try warm salt water gargles for painful gums and teeth.

## 2017-05-14 NOTE — ED Triage Notes (Signed)
Patient c/o tooth pain with swelling in gums, c/o fever

## 2017-06-05 ENCOUNTER — Ambulatory Visit: Payer: Self-pay | Admitting: Sports Medicine

## 2017-06-08 ENCOUNTER — Encounter: Payer: Self-pay | Admitting: Osteopathic Medicine

## 2017-06-08 ENCOUNTER — Ambulatory Visit (INDEPENDENT_AMBULATORY_CARE_PROVIDER_SITE_OTHER): Payer: Medicare Other | Admitting: Osteopathic Medicine

## 2017-06-08 VITALS — BP 154/88 | HR 81 | Temp 98.2°F | Ht 63.0 in | Wt 180.0 lb

## 2017-06-08 DIAGNOSIS — H9203 Otalgia, bilateral: Secondary | ICD-10-CM

## 2017-06-08 MED ORDER — AMOXICILLIN-POT CLAVULANATE 875-125 MG PO TABS: 1.0000 | ORAL_TABLET | Freq: Two times a day (BID) | ORAL | 0 refills | Status: DC

## 2017-06-08 MED ORDER — FLUTICASONE PROPIONATE 50 MCG/ACT NA SUSP: 2.0000 | Freq: Every day | NASAL | 2 refills | Status: DC

## 2017-06-08 MED ORDER — FLUCONAZOLE 150 MG PO TABS: 150.0000 mg | ORAL_TABLET | Freq: Once | ORAL | 0 refills | Status: AC

## 2017-06-08 NOTE — Progress Notes (Signed)
HPI: Janet Richards is a 56 y.o. female  who presents to [facility] today, 06/08/17,  for chief complaint of:  Chief Complaint  Patient presents with  . Ear Pain    bilateral    Ear pain, bilateral x1 week. Took ASA. Recent fever/chills has resolved. Stabbing pain. Dental issues - unable to get all teeth pulled that needed to be. No new dental pain. +sinus congestion on both sides as well     Past medical history, surgical history, social history and family history reviewed.  Patient Active Problem List   Diagnosis Date Noted  . Vaginal dryness, menopausal 03/01/2017  . Neuropathy 03/01/2017  . Abnormal ankle brachial index (ABI) 11/09/2016  . Burning sensation of foot 09/02/2016  . Ganglion cyst of right foot 07/12/2016  . Chronic dental infection 04/11/2016  . Anemia, iron deficiency 02/25/2016  . Right ankle pain 02/24/2016  . Fatigue 02/24/2016  . Paresthesias 07/01/2015  . History of colonic polyps 03/20/2015  . Hyperlipidemia 02/20/2015  . Chronic allergic rhinitis 01/14/2015  . IFG (impaired fasting glucose) 10/17/2014  . Osteoarthritis of first metatarsophalangeal joint 04/11/2014  . 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 No blood products 03/28/2013  . Essential hypertension, benign 03/01/2013  . Exertional chest pain 02/05/2013  . Abnormal uterine bleeding 02/05/2013  . Primary osteoarthritis of both knees 12/27/2012    Current medication list and allergy/intolerance information reviewed.   Current Outpatient Prescriptions on File Prior to Visit  Medication Sig Dispense Refill  . cetirizine HCl (ZYRTEC) 1 MG/ML solution TAKE 10 MLS (10 MG TOTAL) BY MOUTH DAILY. 944 mL 4  . conjugated estrogens (PREMARIN) vaginal cream Place 1 Applicatorful vaginally daily. For first 2 weeks then decrease to 3 times a week. 42.5 g 12  . cyclobenzaprine (FLEXERIL) 10 MG tablet Take a half to a full tab every 8-12 hours only as needed for muscle  spasm, may cause sedation. 45 tablet 0  . gabapentin (NEURONTIN) 100 MG capsule Take 1-3 tablets about 1 hour before bedtime. 90 capsule 1  . hydrochlorothiazide (HYDRODIURIL) 25 MG tablet TAKE 1 TABLET (25 MG TOTAL) BY MOUTH DAILY. 90 tablet 0  . montelukast (SINGULAIR) 10 MG tablet Take 10 mg by mouth at bedtime.     . pantoprazole (PROTONIX) 40 MG tablet TAKE 1 TABLET (40 MG TOTAL) BY MOUTH DAILY. 90 tablet 1  . albuterol (PROVENTIL HFA;VENTOLIN HFA) 108 (90 Base) MCG/ACT inhaler Inhale two puffs every 4-6 hours only as needed for shortness of breath or wheezing. 1 Inhaler 1   No current facility-administered medications on file prior to visit.    Allergies  Allergen Reactions  . Azithromycin Nausea And Vomiting  . Clindamycin/Lincomycin Hives    Facial swelling  . Erythromycin Nausea And Vomiting and Hives    Has taken azithromycin with no issues in the past.   . Metronidazole Hives    Rash, has had fluconazole (Diflucan) w/o reaction  . Nsaids Swelling and Hives  . Sulfa Antibiotics Hives      Review of Systems:  Constitutional: No recent illness other than as noted in HPI  HEENT: +sinus headache, no vision change, ear pain as per HPI  Cardiac: No  chest pain, No  pressure,  Respiratory:  No  shortness of breath. No  Cough  Musculoskeletal: No new myalgia/arthralgia  Skin: No  Rash  Hem/Onc: No  easy bruising/bleeding, No  abnormal lumps/bumps  Neurologic: No  weakness, No  Dizziness   Exam:  BP [name])  154/88   Pulse 81   Temp 98.2 F (36.8 C) (Oral)   Ht 5\' 3"  (1.6 m)   Wt 180 lb (81.6 kg)   BMI 31.89 kg/m   Constitutional: VS see above. General Appearance: alert, well-developed, well-nourished, NAD  Eyes: Normal lids and conjunctive, non-icteric sclera  Ears, Nose, Mouth, Throat: MMM, Normal external inspection ears/nares/mouth/lips/gums. TM and canals normal bilaterally, normal jaw movement, poor dentition but no abscess noted  Neck: No masses, trachea  midline.   Respiratory: Normal respiratory effort  Musculoskeletal: Gait normal.  Skin: warm, dry, intact.   Psychiatric: Normal judgment/insight. Normal mood and affect. Oriented x3.      ASSESSMENT/PLAN:   Ear pain, bilateral - Plan: fluticasone (FLONASE) 50 MCG/ACT nasal spray, amoxicillin-clavulanate (AUGMENTIN) 875-125 MG tablet    Patient Instructions  I tihnk more likely sinus congestion/infection or dental issue - will try antibiotics and nasal spray, if no better please see your dentist     Follow-up plan: Return if symptoms worsen or fail to improve.  Visit summary with medication list and pertinent instructions was printed for patient to review, alert [place] if any changes needed. All questions at time of visit were answered - patient instructed to contact office with any additional concerns. ER/RTC precautions were reviewed with the patient and understanding verbalized.

## 2017-06-08 NOTE — Patient Instructions (Addendum)
I tihnk more likely sinus congestion/infection or dental issue - will try antibiotics and nasal spray, if no better please see your dentist

## 2017-06-19 ENCOUNTER — Encounter: Payer: Self-pay | Admitting: Emergency Medicine

## 2017-06-19 ENCOUNTER — Emergency Department (INDEPENDENT_AMBULATORY_CARE_PROVIDER_SITE_OTHER)
Admission: EM | Admit: 2017-06-19 | Discharge: 2017-06-19 | Disposition: A | Discharge status: Home / Self Care | Payer: Medicare Other | Source: Home / Self Care | Attending: Family Medicine | Admitting: Family Medicine

## 2017-06-19 DIAGNOSIS — K029 Dental caries, unspecified: Secondary | ICD-10-CM

## 2017-06-19 DIAGNOSIS — K0889 Other specified disorders of teeth and supporting structures: Secondary | ICD-10-CM

## 2017-06-19 MED ORDER — AMOXICILLIN-POT CLAVULANATE 875-125 MG PO TABS: 1.0000 | ORAL_TABLET | Freq: Two times a day (BID) | ORAL | 0 refills | Status: DC

## 2017-06-19 NOTE — ED Triage Notes (Signed)
Upper Right molar broke off today causing severe pain, throbbing headache pain.

## 2017-06-19 NOTE — Discharge Instructions (Signed)
Try warm salt water gargles. °May take Ibuprofen 200mg, 4 tabs every 8 hours with food.  °

## 2017-06-19 NOTE — ED Provider Notes (Signed)
[facility]    CSN: [id number] Arrival date & time: 06/19/17  1649     History   Chief Complaint Chief Complaint  Patient presents with  . Dental Pain  . Headache    HPI Janet Richards is a 56 y.o. female.   Patient complains of onset of pain in a broken right upper tooth today.  No fever or swelling in face.   The history is provided by the patient.  Dental Pain  Location:  Upper Upper teeth location:  1/RU 3rd molar Quality:  Aching Severity:  Mild Onset quality:  Sudden Duration:  9 hours Timing:  Constant Progression:  Worsening Chronicity:  Recurrent Context: dental fracture and poor dentition   Relieved by:  None tried Worsened by:  Touching and pressure Ineffective treatments:  None tried Associated symptoms: headaches   Associated symptoms: no congestion, no difficulty swallowing, no drooling, no facial pain, no facial swelling, no fever, no gum swelling, no neck pain, no neck swelling, no oral bleeding, no oral lesions and no trismus   Risk factors: lack of dental care   Headache  Associated symptoms: no congestion, no facial pain, no fever and no neck pain     Past Medical History:  Diagnosis Date  . Essential hypertension, benign   . Menstrual migraine   . Periodontitis   . Primary osteoarthritis of both knees   . Urticaria     Patient Active Problem List   Diagnosis Date Noted  . Vaginal dryness, menopausal 03/01/2017  . Neuropathy 03/01/2017  . Abnormal ankle brachial index (ABI) 11/09/2016  . Burning sensation of foot 09/02/2016  . Ganglion cyst of right foot 07/12/2016  . Chronic dental infection 04/11/2016  . Anemia, iron deficiency 02/25/2016  . Right ankle pain 02/24/2016  . Fatigue 02/24/2016  . Paresthesias 07/01/2015  . History of colonic polyps 03/20/2015  . Hyperlipidemia 02/20/2015  . Chronic allergic rhinitis 01/14/2015  . IFG (impaired fasting glucose) 10/17/2014  . Osteoarthritis of first  metatarsophalangeal joint 04/11/2014  . 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 No blood products 03/28/2013  . Essential hypertension, benign 03/01/2013  . Exertional chest pain 02/05/2013  . Abnormal uterine bleeding 02/05/2013  . Primary osteoarthritis of both knees 12/27/2012    Past Surgical History:  Procedure Laterality Date  . TUBAL LIGATION  12/26/1987    OB History    Gravida Para Term Preterm AB Living   5 3 3   2 3    SAB TAB Ectopic Multiple Live Births     2             Home Medications    Prior to Admission medications   Medication Sig Start Date End Date Taking? Authorizing Provider  albuterol (PROVENTIL HFA;VENTOLIN HFA) 108 (90 Base) MCG/ACT inhaler Inhale two puffs every 4-6 hours only as needed for shortness of breath or wheezing. 01/19/16 01/18/17  [name], MD  amoxicillin-clavulanate (AUGMENTIN) 875-125 MG tablet Take 1 tablet by mouth 2 (two) times daily. Take with food 06/19/17   Kandra Nicolas, MD  cetirizine HCl (ZYRTEC) 1 MG/ML solution TAKE 10 MLS (10 MG TOTAL) BY MOUTH DAILY. 03/20/17   Breeback, Luvenia Starch L, PA-C  conjugated estrogens (PREMARIN) vaginal cream Place 1 Applicatorful vaginally daily. For first 2 weeks then decrease to 3 times a week. 03/01/17   Breeback, Royetta Car, PA-C  cyclobenzaprine (FLEXERIL) 10 MG tablet Take a half to a full tab every 8-12 hours only as needed for muscle spasm, may  cause sedation. 08/08/13   Hommel, Sean, DO  fluticasone (FLONASE) 50 MCG/ACT nasal spray Place 2 sprays into both nostrils daily. 06/08/17   Emeterio Reeve, DO  gabapentin (NEURONTIN) 100 MG capsule Take 1-3 tablets about 1 hour before bedtime. 03/01/17   Breeback, Jade L, PA-C  hydrochlorothiazide (HYDRODIURIL) 25 MG tablet TAKE 1 TABLET (25 MG TOTAL) BY MOUTH DAILY. 01/18/17   [name], MD  montelukast (SINGULAIR) 10 MG tablet Take 10 mg by mouth at bedtime.  04/04/13   [provider]  pantoprazole (PROTONIX) 40 MG tablet TAKE 1 TABLET (40 MG  TOTAL) BY MOUTH DAILY. 04/28/17   [name], PA-C    Family History Family History  Problem Relation Age of Onset  . Hypertension Mother   . CAD Father        MI in his 71s    Social History Social History  Substance Use Topics  . Smoking status: Former Smoker    Packs/day: 1.50    Types: Cigarettes    Quit date: 02/05/2006  . Smokeless tobacco: Never Used  . Alcohol use Yes     Comment: rarely     Allergies   Azithromycin; Clindamycin/lincomycin; Erythromycin; Metronidazole; Nsaids; and Sulfa antibiotics   Review of Systems Review of Systems  Constitutional: Negative for fever.  HENT: Negative for congestion, drooling, facial swelling and mouth sores.   Musculoskeletal: Negative for neck pain.  Neurological: Positive for headaches.  All other systems reviewed and are negative.    Physical Exam Triage Vital Signs ED Triage Vitals [06/19/17 1711]  Enc Vitals Group     BP (!) 167/89     Pulse Rate 78     Resp      Temp 98.6 F (37 C)     Temp Source Oral     SpO2 100 %     Weight 180 lb (81.6 kg)     Height 5\' 3"  (1.6 m)     Head Circumference      Peak Flow      Pain Score 8     Pain Loc      Pain Edu?      Excl. in [place]?    No data found.   Updated Vital Signs BP (!) 167/89 (BP Location: Left Arm)   Pulse 78   Temp 98.6 F (37 C) (Oral)   Ht 5\' 3"  (1.6 m)   Wt 180 lb (81.6 kg)   SpO2 100%   BMI 31.89 kg/m   Visual Acuity Right Eye Distance:   Left Eye Distance:   Bilateral Distance:    Right Eye Near:   Left Eye Near:    Bilateral Near:     Physical Exam  Constitutional: She appears well-developed and well-nourished. No distress.  HENT:  Head: Atraumatic.  Right Ear: Tympanic membrane, external ear and ear canal normal.  Left Ear: Tympanic membrane, external ear and ear canal normal.  Nose: Nose normal.  Mouth/Throat: Oropharynx is clear and moist and mucous membranes are normal. No trismus in the jaw. Abnormal dentition.  Dental caries present.    Right upper tooth #3 is eroded with tenderness to tap, and tenderness of gingiva.  No fluctuance.  Eyes: Pupils are equal, round, and reactive to light. Conjunctivae are normal.  Neck: Neck supple.  Cardiovascular: Normal rate.   Pulmonary/Chest: Effort normal.  Lymphadenopathy:    She has no cervical adenopathy.  Neurological: She is alert.  Skin: Skin is warm and dry.  Nursing  note and vitals reviewed.    UC Treatments / Results  Labs (all labs ordered are listed, but only abnormal results are displayed) Labs Reviewed - No data to display  EKG  EKG Interpretation None       Radiology No results found.  Procedures Procedures (including critical care time)  Medications Ordered in UC Medications - No data to display   Initial Impression / Assessment and Plan / UC Course  I have reviewed the triage vital signs and the nursing notes.  Pertinent labs & imaging results that were available during my care of the patient were reviewed by me and considered in my medical decision making (see chart for details).    Begin Augmentin. Try warm salt water gargles.  May take Ibuprofen 200mg , 4 tabs every 8 hours with food.  Followup with dentist as soon as possible.     Final Clinical Impressions(s) / UC Diagnoses   Final diagnoses:  Pain, dental  Dental caries    New Prescriptions New Prescriptions   AMOXICILLIN-CLAVULANATE (AUGMENTIN) 875-125 MG TABLET    Take 1 tablet by mouth 2 (two) times daily. Take with food         Kandra Nicolas, MD 06/28/17 1022

## 2017-06-27 ENCOUNTER — Ambulatory Visit (INDEPENDENT_AMBULATORY_CARE_PROVIDER_SITE_OTHER): Payer: Medicare Other | Admitting: Physician Assistant

## 2017-06-27 ENCOUNTER — Encounter: Payer: Self-pay | Admitting: Physician Assistant

## 2017-06-27 VITALS — BP 152/94 | HR 77 | Temp 98.7°F | Ht 63.0 in | Wt 182.0 lb

## 2017-06-27 DIAGNOSIS — R0789 Other chest pain: Secondary | ICD-10-CM | POA: Diagnosis not present

## 2017-06-27 DIAGNOSIS — I10 Essential (primary) hypertension: Secondary | ICD-10-CM

## 2017-06-27 DIAGNOSIS — K121 Other forms of stomatitis: Secondary | ICD-10-CM

## 2017-06-27 NOTE — Progress Notes (Signed)
Subjective:    Patient ID: Janet Richards, female    DOB: 1961-06-14, 56 y.o.   MRN: [id number]  HPI  Pt is a 56 yo female who presents to the clinic to follow up after dental procedure where they pulled multiple teeth and placed temporary partial. She was placed on abx 8/13 due to infection before procedure. She remains on abx. After procedure her chest has felt tight at times and she wonders if she could have "fluid" on her lungs. She denies any SOB, CP, wheezing. She is not taking HCTZ. She feels like symptoms are resolving. No fever. She does have a sore on her left upper gumline just above partial.   .. Active Ambulatory Problems    Diagnosis Date Noted  . Primary osteoarthritis of both knees 12/27/2012  . Exertional chest pain 02/05/2013  . Abnormal uterine bleeding 02/05/2013  . Essential hypertension, benign 03/01/2013  . No blood products 03/28/2013  . 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 Osteoarthritis of first metatarsophalangeal joint 04/11/2014  . IFG (impaired fasting glucose) 10/17/2014  . Chronic allergic rhinitis 01/14/2015  . Hyperlipidemia 02/20/2015  . History of colonic polyps 03/20/2015  . Paresthesias 07/01/2015  . Right ankle pain 02/24/2016  . Fatigue 02/24/2016  . Anemia, iron deficiency 02/25/2016  . Chronic dental infection 04/11/2016  . Ganglion cyst of right foot 07/12/2016  . Burning sensation of foot 09/02/2016  . Abnormal ankle brachial index (ABI) 11/09/2016  . Vaginal dryness, menopausal 03/01/2017  . Neuropathy 03/01/2017   Resolved Ambulatory Problems    Diagnosis Date Noted  . Allergic urticaria 12/27/2012  . Dental abscess 02/05/2013  . Menstrual migraine 02/05/2013  . Right lumbar radiculitis 04/19/2013  . Hyperglycemia 08/05/2013  . Allergic rhinitis 01/22/2014  . Left knee pain 02/25/2014  . Dental infection 03/20/2014  . Allergic sinusitis 01/14/2015  . Pain, dental 01/14/2015  . Sinusitis 06/26/2015  . Chest pain 07/08/2015   . Right knee injury 07/15/2015  . Neck pain 09/01/2015  . UTI (urinary tract infection) 11/25/2015  . Acute upper respiratory infection 12/22/2015  . Low blood potassium 05/24/2016  . Elevated lipase 05/24/2016  . Insect bite 05/25/2016   Past Medical History:  Diagnosis Date  . Essential hypertension, benign   . Menstrual migraine   . Periodontitis   . Primary osteoarthritis of both knees   . Urticaria       Review of Systems    see HPI>  Objective:   Physical Exam  Constitutional: She is oriented to person, place, and time. She appears well-developed and well-nourished.  HENT:  Head: Normocephalic and atraumatic.  Left upper gumline 38mm by 37mm ulcer just above partial.   Neck: No JVD present.  Cardiovascular: Normal rate, regular rhythm and normal heart sounds.   Pulmonary/Chest: Effort normal and breath sounds normal. She has no wheezes.  Neurological: She is alert and oriented to person, place, and time.  Skin: Skin is dry.  Negative for any lower extermity edema. Good pedal pulses.   Psychiatric: She has a normal mood and affect. Her behavior is normal.          Assessment & Plan:  Marland KitchenMarland KitchenDiagnoses and all orders for this visit:  Essential hypertension, benign  Mouth ulcer  Chest tightness   Start back on HCTZ and recheck BP in 2 weeks. Encouraged low salt diet.   Encouraged salt water gargle. HO given. Avoid food/drink exacerbators. Likely once your mouth gets used to dental implant will not be a problem.  Reassured patient since chest tightness has gone and no exam findings of fluid on lungs. They sound great. No JVD or lower ext. Follow up as needed.

## 2017-06-27 NOTE — Patient Instructions (Signed)
Canker Sores Canker sores are small, painful sores that develop inside your mouth. They may also be called aphthous ulcers. You can get canker sores on the inside of your lips or cheeks, on your tongue, or anywhere inside your mouth. You can have just one canker sore or several of them. Canker sores cannot be passed from one person to another (noncontagious). These sores are different than the sores that you may get on the outside of your lips (cold sores or fever blisters). Canker sores usually start as painful red bumps. Then they turn into small white, yellow, or gray ulcers that have red borders. The ulcers may be quite painful. The pain may be worse when you eat or drink. What are the causes? The cause of this condition is not known. What increases the risk? This condition is more likely to develop in:  Women.  People in their teens or 20s.  Women who are having their menstrual period.  People who are under a lot of emotional stress.  People who do not get enough iron or B vitamins.  People who have poor oral hygiene.  People who have an injury inside the mouth. This can happen after having dental work or from chewing something hard.  What are the signs or symptoms? Along with the canker sore, symptoms may also include:  Fever.  Fatigue.  Swollen lymph nodes in your neck.  How is this diagnosed? This condition can be diagnosed based on your symptoms. Your health care provider will also examine your mouth. Your health care provider may also do tests if you get canker sores often or if they are very bad. Tests may include:  Blood tests to rule out other causes of canker sores.  Taking swabs from the sore to check for infection.  Taking a small piece of skin from the sore (biopsy) to test it for cancer.  How is this treated? Most canker sores clear up without treatment in about 10 days. Home care is usually the only treatment that you will need. Over-the-counter medicines  can relieve discomfort.If you have severe canker sores, your health care provider may prescribe:  Numbing ointment to relieve pain.  Vitamins.  Steroid medicines. These may be given as: ? Oral pills. ? Mouth rinses. ? Gels.  Antibiotic mouth rinse.  Follow these instructions at home:  Apply, take, or use medicines only as directed by your health care provider. These include vitamins.  If you were prescribed an antibiotic mouth rinse, finish all of it even if you start to feel better.  Until the sores are healed: ? Do not drink coffee or citrus juices. ? Do not eat spicy or salty foods.  Use a mild, over-the-counter mouth rinse as directed by your health care provider.  Practice good oral hygiene. ? Floss your teeth every day. ? Brush your teeth with a soft brush twice each day. Contact a health care provider if:  Your symptoms do not get better after two weeks.  You also have a fever or swollen glands.  You get canker sores often.  You have a canker sore that is getting larger.  You cannot eat or drink due to your canker sores. This information is not intended to replace advice given to you by your health care provider. Make sure you discuss any questions you have with your health care provider. Document Released: 02/18/2011 Document Revised: 03/31/2016 Document Reviewed: 09/24/2014 [employer] Interactive Patient Education  2018 [employer]  

## 2017-07-01 ENCOUNTER — Emergency Department (INDEPENDENT_AMBULATORY_CARE_PROVIDER_SITE_OTHER)
Admission: EM | Admit: 2017-07-01 | Discharge: 2017-07-01 | Disposition: A | Discharge status: Home / Self Care | Payer: Medicare Other | Source: Home / Self Care | Attending: Family Medicine | Admitting: Family Medicine

## 2017-07-01 ENCOUNTER — Encounter: Payer: Self-pay | Admitting: Emergency Medicine

## 2017-07-01 DIAGNOSIS — R350 Frequency of micturition: Secondary | ICD-10-CM

## 2017-07-01 LAB — POCT URINALYSIS DIP (MANUAL ENTRY)
Bilirubin, UA: NEGATIVE
Glucose, UA: NEGATIVE mg/dL
Ketones, POC UA: NEGATIVE mg/dL
Leukocytes, UA: NEGATIVE
Nitrite, UA: NEGATIVE
Protein Ur, POC: NEGATIVE mg/dL
Spec Grav, UA: 1.015 (ref 1.010–1.025)
Urobilinogen, UA: 0.2 E.U./dL
pH, UA: 6 (ref 5.0–8.0)

## 2017-07-01 MED ORDER — NITROFURANTOIN MONOHYD MACRO 100 MG PO CAPS: 100.0000 mg | ORAL_CAPSULE | Freq: Two times a day (BID) | ORAL | 0 refills | Status: DC

## 2017-07-01 NOTE — ED Provider Notes (Signed)
[name]    CSN: [id number] Arrival date & time: 07/01/17  5329     History   Chief Complaint Chief Complaint  Patient presents with  . Recurrent UTI    HPI Janet Richards is a 56 y.o. female.   Patient complains of 3 to 4 day history of urinary frequency, urgency, and vague lower abdominal discomfort without dysuria or hematuria.  She has been taking HCTZ 25mg  daily for the past five days, and admits her symptoms may be resulting from the diuretic.  She feels well.  No fevers, chills, and sweats.      Past Medical History:  Diagnosis Date  . Essential hypertension, benign   . Menstrual migraine   . Periodontitis   . Primary osteoarthritis of both knees   . Urticaria     Patient Active Problem List   Diagnosis Date Noted  . Vaginal dryness, menopausal 03/01/2017  . Neuropathy 03/01/2017  . Abnormal ankle brachial index (ABI) 11/09/2016  . Burning sensation of foot 09/02/2016  . Ganglion cyst of right foot 07/12/2016  . Chronic dental infection 04/11/2016  . Anemia, iron deficiency 02/25/2016  . Right ankle pain 02/24/2016  . Fatigue 02/24/2016  . Paresthesias 07/01/2015  . History of colonic polyps 03/20/2015  . Hyperlipidemia 02/20/2015  . Chronic allergic rhinitis 01/14/2015  . IFG (impaired fasting glucose) 10/17/2014  . Osteoarthritis of first metatarsophalangeal joint 04/11/2014  . 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 No blood products 03/28/2013  . Essential hypertension, benign 03/01/2013  . Exertional chest pain 02/05/2013  . Abnormal uterine bleeding 02/05/2013  . Primary osteoarthritis of both knees 12/27/2012    Past Surgical History:  Procedure Laterality Date  . TUBAL LIGATION  12/26/1987    OB History    Gravida Para Term Preterm AB Living   5 3 3   2 3    SAB TAB Ectopic Multiple Live Births     2             Home Medications    Prior to Admission medications   Medication Sig Start Date End Date Taking?  Authorizing Provider  albuterol (PROVENTIL HFA;VENTOLIN HFA) 108 (90 Base) MCG/ACT inhaler Inhale two puffs every 4-6 hours only as needed for shortness of breath or wheezing. 01/19/16 01/18/17  [name], MD  cetirizine HCl (ZYRTEC) 1 MG/ML solution TAKE 10 MLS (10 MG TOTAL) BY MOUTH DAILY. 03/20/17   Breeback, Luvenia Starch L, PA-C  conjugated estrogens (PREMARIN) vaginal cream Place 1 Applicatorful vaginally daily. For first 2 weeks then decrease to 3 times a week. 03/01/17   Breeback, Jade L, PA-C  hydrochlorothiazide (HYDRODIURIL) 25 MG tablet TAKE 1 TABLET (25 MG TOTAL) BY MOUTH DAILY. 01/18/17   [name], MD  montelukast (SINGULAIR) 10 MG tablet Take 10 mg by mouth at bedtime.  04/04/13   [provider]  nitrofurantoin, macrocrystal-monohydrate, (MACROBID) 100 MG capsule Take 1 capsule (100 mg total) by mouth 2 (two) times daily. Take with food. 07/01/17   Kandra Nicolas, MD  pantoprazole (PROTONIX) 40 MG tablet TAKE 1 TABLET (40 MG TOTAL) BY MOUTH DAILY. 04/28/17   [name], PA-C    Family History Family History  Problem Relation Age of Onset  . Hypertension Mother   . CAD Father        MI in his 63s    Social History Social History  Substance Use Topics  . Smoking status: Former Smoker    Packs/day: 1.50  Types: Cigarettes    Quit date: 02/05/2006  . Smokeless tobacco: Never Used  . Alcohol use Yes     Comment: rarely     Allergies   Clindamycin/lincomycin; Erythromycin; Metronidazole; and Sulfa antibiotics   Review of Systems Review of Systems  Constitutional: Negative.   HENT: Negative.   Eyes: Negative.   Respiratory: Negative.   Cardiovascular: Negative.   Gastrointestinal: Positive for abdominal pain. Negative for abdominal distention, diarrhea, nausea and vomiting.  Genitourinary: Positive for frequency and urgency. Negative for dysuria, flank pain, hematuria, pelvic pain and vaginal pain.  Musculoskeletal: Negative.   Skin: Negative.       Physical Exam Triage Vital Signs ED Triage Vitals [07/01/17 1018]  Enc Vitals Group     BP 121/82     Pulse Rate 89     Resp      Temp 98 F (36.7 C)     Temp Source Oral     SpO2 99 %     Weight 175 lb (79.4 kg)     Height 5\' 3"  (1.6 m)     Head Circumference      Peak Flow      Pain Score 0     Pain Loc      Pain Edu?      Excl. in [place]?    No data found.   Updated Vital Signs BP 121/82 (BP Location: Left Arm)   Pulse 89   Temp 98 F (36.7 C) (Oral)   Ht 5\' 3"  (1.6 m)   Wt 175 lb (79.4 kg)   SpO2 99%   BMI 31.00 kg/m   Visual Acuity Right Eye Distance:   Left Eye Distance:   Bilateral Distance:    Right Eye Near:   Left Eye Near:    Bilateral Near:     Physical Exam Nursing notes and Vital Signs reviewed. Appearance:  Patient appears stated age, and in no acute distress.    Eyes:  Pupils are equal, round, and reactive to light and accomodation.  Extraocular movement is intact.  Conjunctivae are not inflamed   Pharynx:  Normal; moist mucous membranes  Neck:  Supple.  No adenopathy Lungs:  Clear to auscultation.  Breath sounds are equal.  Moving air well. Heart:  Regular rate and rhythm without murmurs, rubs, or gallops.  Abdomen:  Nontender without masses or hepatosplenomegaly.  Bowel sounds are present.  No CVA or flank tenderness.  Extremities:  No edema.  Skin:  No rash present.     UC Treatments / Results  Labs (all labs ordered are listed, but only abnormal results are displayed) Labs Reviewed  POCT URINALYSIS DIP (MANUAL ENTRY) - Abnormal; Notable for the following:       Result Value   Blood, UA trace-intact (*)    All other components within normal limits  URINE CULTURE    EKG  EKG Interpretation None       Radiology No results found.  Procedures Procedures (including critical care time)  Medications Ordered in UC Medications - No data to display   Initial Impression / Assessment and Plan / UC Course  I have reviewed  the triage vital signs and the nursing notes.  Pertinent labs & imaging results that were available during my care of the patient were reviewed by me and considered in my medical decision making (see chart for details).    No evidence UTI on today's urinalysis.  Treat symptomatically for now. Urine culture pending. Increase fluid intake.  Given Rx for Macrobid to hold:  Begin if culture positive, or if symptoms worsen over the weekend. May use non-prescription AZO for about two days, if desired, to decrease urinary discomfort.   Followup with Family Doctor if not improved in about 6 days.    Final Clinical Impressions(s) / UC Diagnoses   Final diagnoses:  Urinary frequency    New Prescriptions New Prescriptions   NITROFURANTOIN, MACROCRYSTAL-MONOHYDRATE, (MACROBID) 100 MG CAPSULE    Take 1 capsule (100 mg total) by mouth 2 (two) times daily. Take with food.        Kandra Nicolas, MD 07/09/17 [phone]

## 2017-07-01 NOTE — Discharge Instructions (Signed)
Increase fluid intake.  Begin taking Macrobid if increased urinary burning and discomfort occur over the weekend.

## 2017-07-01 NOTE — ED Triage Notes (Signed)
Patient complaining about lower abdominal pain, frequency, urgency, no dysuria, no hematuria.

## 2017-07-02 ENCOUNTER — Telehealth: Payer: Self-pay | Admitting: Emergency Medicine

## 2017-07-02 LAB — URINE CULTURE: Organism ID, Bacteria: NO GROWTH

## 2017-07-02 NOTE — Telephone Encounter (Signed)
Patient informed of lab results. 

## 2017-07-03 ENCOUNTER — Other Ambulatory Visit: Payer: Self-pay | Admitting: Family Medicine

## 2017-07-11 ENCOUNTER — Encounter: Payer: Self-pay | Admitting: Physician Assistant

## 2017-07-11 ENCOUNTER — Ambulatory Visit (INDEPENDENT_AMBULATORY_CARE_PROVIDER_SITE_OTHER): Payer: Medicare Other | Admitting: Physician Assistant

## 2017-07-11 VITALS — BP 101/52 | HR 76 | Wt 178.0 lb

## 2017-07-11 DIAGNOSIS — I10 Essential (primary) hypertension: Secondary | ICD-10-CM

## 2017-07-11 DIAGNOSIS — Z23 Encounter for immunization: Secondary | ICD-10-CM

## 2017-07-11 MED ORDER — HYDROCHLOROTHIAZIDE 12.5 MG PO TABS: 12.5000 mg | ORAL_TABLET | Freq: Every day | ORAL | 3 refills | Status: DC

## 2017-07-11 NOTE — Progress Notes (Signed)
   Subjective:    Patient ID: Janet Richards, female    DOB: 1960/11/09, 56 y.o.   MRN: [id number]  HPI Pt is a 56 yo female who presents to the clinic for HTN follow up. We restarted diuretic at last visit due to elevated bP for last view visits.  She is doing well without any CP, palpitations, headaches, dizziness, vision changes. She is not checking BP. She did just get back from the beach.   .. Active Ambulatory Problems    Diagnosis Date Noted  . Primary osteoarthritis of both knees 12/27/2012  . Exertional chest pain 02/05/2013  . Abnormal uterine bleeding 02/05/2013  . Essential hypertension, benign 03/01/2013  . No blood products 03/28/2013  . 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 Osteoarthritis of first metatarsophalangeal joint 04/11/2014  . IFG (impaired fasting glucose) 10/17/2014  . Chronic allergic rhinitis 01/14/2015  . Hyperlipidemia 02/20/2015  . History of colonic polyps 03/20/2015  . Paresthesias 07/01/2015  . Right ankle pain 02/24/2016  . Fatigue 02/24/2016  . Anemia, iron deficiency 02/25/2016  . Chronic dental infection 04/11/2016  . Ganglion cyst of right foot 07/12/2016  . Burning sensation of foot 09/02/2016  . Abnormal ankle brachial index (ABI) 11/09/2016  . Vaginal dryness, menopausal 03/01/2017  . Neuropathy 03/01/2017   Resolved Ambulatory Problems    Diagnosis Date Noted  . Allergic urticaria 12/27/2012  . Dental abscess 02/05/2013  . Menstrual migraine 02/05/2013  . Right lumbar radiculitis 04/19/2013  . Hyperglycemia 08/05/2013  . Allergic rhinitis 01/22/2014  . Left knee pain 02/25/2014  . Dental infection 03/20/2014  . Allergic sinusitis 01/14/2015  . Pain, dental 01/14/2015  . Sinusitis 06/26/2015  . Chest pain 07/08/2015  . Right knee injury 07/15/2015  . Neck pain 09/01/2015  . UTI (urinary tract infection) 11/25/2015  . Acute upper respiratory infection 12/22/2015  . Low blood potassium 05/24/2016  . Elevated lipase 05/24/2016   . Insect bite 05/25/2016   Past Medical History:  Diagnosis Date  . Essential hypertension, benign   . Menstrual migraine   . Periodontitis   . Primary osteoarthritis of both knees   . Urticaria         Review of Systems  All other systems reviewed and are negative.      Objective:   Physical Exam  Constitutional: She is oriented to person, place, and time. She appears well-developed and well-nourished.  HENT:  Head: Normocephalic and atraumatic.  Cardiovascular: Normal rate, regular rhythm and normal heart sounds.   Pulmonary/Chest: Effort normal and breath sounds normal.  Neurological: She is alert and oriented to person, place, and time.  Psychiatric: She has a normal mood and affect. Her behavior is normal.          Assessment & Plan:  Marland KitchenMarland KitchenMarlena was seen today for f/u bp.  Diagnoses and all orders for this visit:  Hypertension, essential, benign -     hydrochlorothiazide (HYDRODIURIL) 12.5 MG tablet; Take 1 tablet (12.5 mg total) by mouth daily.  Need for immunization against influenza -     Flu Vaccine QUAD 36+ mos IM   BP low today. Decreased HCTZ to 12.5mg  daily. Discussed signs of hypotension. Follow up in 6 months.  Flu shot given today.

## 2017-07-14 ENCOUNTER — Other Ambulatory Visit: Payer: Self-pay | Admitting: Physician Assistant

## 2017-07-14 DIAGNOSIS — Z1231 Encounter for screening mammogram for malignant neoplasm of breast: Secondary | ICD-10-CM

## 2017-07-16 ENCOUNTER — Emergency Department (INDEPENDENT_AMBULATORY_CARE_PROVIDER_SITE_OTHER)
Admission: EM | Admit: 2017-07-16 | Discharge: 2017-07-16 | Disposition: A | Discharge status: Home / Self Care | Payer: Medicare Other | Source: Home / Self Care | Attending: Family Medicine | Admitting: Family Medicine

## 2017-07-16 ENCOUNTER — Encounter: Payer: Self-pay | Admitting: Emergency Medicine

## 2017-07-16 DIAGNOSIS — R0981 Nasal congestion: Secondary | ICD-10-CM | POA: Diagnosis not present

## 2017-07-16 DIAGNOSIS — R519 Headache, unspecified: Secondary | ICD-10-CM

## 2017-07-16 DIAGNOSIS — R51 Headache: Secondary | ICD-10-CM

## 2017-07-16 MED ORDER — AMOXICILLIN 500 MG PO CAPS: 500.0000 mg | ORAL_CAPSULE | Freq: Three times a day (TID) | ORAL | 0 refills | Status: DC

## 2017-07-16 NOTE — Discharge Instructions (Signed)
°  Your symptoms are likely due to a virus such as the common cold, however, if you developing worsening chest congestion with shortness of breath, persistent fever (>100.4*F) for 3 days, or symptoms not improving in 7-10 days, you may fill the antibiotic (amoxicillin).  If you do fill the antibiotic,  please take antibiotics as prescribed and be sure to complete entire course even if you start to feel better to ensure infection does not come back.  You may take 500mg  acetaminophen every 4-6 hours or in combination with ibuprofen 400-600mg  every 6-8 hours as needed for pain, inflammation, and fever.  Be sure to drink at least eight 8oz glasses of water to stay well hydrated and get at least 8 hours of sleep at night, preferably more while sick.

## 2017-07-16 NOTE — ED Triage Notes (Signed)
Patient presents to [facility] with C/O pain in facial area sinus pressure ear pain bilaterally.Headache times 3 days. No fever.

## 2017-07-16 NOTE — ED Provider Notes (Signed)
[facility]    CSN: [id number] Arrival date & time: 07/16/17  1231     History   Chief Complaint Chief Complaint  Patient presents with  . Facial Pain  . Nasal Congestion    HPI Janet Richards is a 56 y.o. female.   HPI  Janet Richards is a 56 y.o. female presenting to UC with c/o sinus congestion with facial pressure and bilateral ear pain for 3 days.  Pain is aching, 7/10. She is on daily Singulair and Zyrtec.  She has also used ipratropium nasal spray the last 2 days, has taken Tylenol and aspirin w/o relief. Denies fever, chills, n/v/d. Denies cough or sore throat. No recent travel or known sick contacts.   Past Medical History:  Diagnosis Date  . Essential hypertension, benign   . Menstrual migraine   . Periodontitis   . Primary osteoarthritis of both knees   . Urticaria     Patient Active Problem List   Diagnosis Date Noted  . Vaginal dryness, menopausal 03/01/2017  . Neuropathy 03/01/2017  . Abnormal ankle brachial index (ABI) 11/09/2016  . Burning sensation of foot 09/02/2016  . Ganglion cyst of right foot 07/12/2016  . Chronic dental infection 04/11/2016  . Anemia, iron deficiency 02/25/2016  . Right ankle pain 02/24/2016  . Fatigue 02/24/2016  . Paresthesias 07/01/2015  . History of colonic polyps 03/20/2015  . Hyperlipidemia 02/20/2015  . Chronic allergic rhinitis 01/14/2015  . IFG (impaired fasting glucose) 10/17/2014  . Osteoarthritis of first metatarsophalangeal joint 04/11/2014  . 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 No blood products 03/28/2013  . Essential hypertension, benign 03/01/2013  . Exertional chest pain 02/05/2013  . Abnormal uterine bleeding 02/05/2013  . Primary osteoarthritis of both knees 12/27/2012    Past Surgical History:  Procedure Laterality Date  . TUBAL LIGATION  12/26/1987    OB History    Gravida Para Term Preterm AB Living   5 3 3   2 3    SAB TAB Ectopic Multiple Live Births     2              Home Medications    Prior to Admission medications   Medication Sig Start Date End Date Taking? Authorizing Provider  albuterol (PROVENTIL HFA;VENTOLIN HFA) 108 (90 Base) MCG/ACT inhaler Inhale two puffs every 4-6 hours only as needed for shortness of breath or wheezing. 01/19/16 01/18/17  [name], MD  amoxicillin (AMOXIL) 500 MG capsule Take 1 capsule (500 mg total) by mouth 3 (three) times daily. 07/16/17   Noe Gens, PA-C  cetirizine HCl (ZYRTEC) 1 MG/ML solution TAKE 10 MLS (10 MG TOTAL) BY MOUTH DAILY. 03/20/17   Breeback, Luvenia Starch L, PA-C  conjugated estrogens (PREMARIN) vaginal cream Place 1 Applicatorful vaginally daily. For first 2 weeks then decrease to 3 times a week. 03/01/17   Breeback, Jade L, PA-C  hydrochlorothiazide (HYDRODIURIL) 12.5 MG tablet Take 1 tablet (12.5 mg total) by mouth daily. 07/11/17   Breeback, Jade L, PA-C  montelukast (SINGULAIR) 10 MG tablet Take 10 mg by mouth at bedtime.  04/04/13   [provider]  pantoprazole (PROTONIX) 40 MG tablet TAKE 1 TABLET (40 MG TOTAL) BY MOUTH DAILY. 04/28/17   [name], PA-C    Family History Family History  Problem Relation Age of Onset  . Hypertension Mother   . CAD Father        MI in his 67s    Social History Social History  Substance Use Topics  . Smoking status: Former Smoker    Packs/day: 1.50    Types: Cigarettes    Quit date: 02/05/2006  . Smokeless tobacco: Never Used  . Alcohol use Yes     Comment: rarely     Allergies   Azithromycin; Clindamycin/lincomycin; Metronidazole; and Sulfa antibiotics   Review of Systems Review of Systems  Constitutional: Negative for chills and fever.  HENT: Positive for congestion, ear pain, rhinorrhea, sinus pain and sinus pressure. Negative for sore throat, trouble swallowing and voice change.   Respiratory: Negative for cough and shortness of breath.   Cardiovascular: Negative for chest pain and palpitations.  Gastrointestinal: Negative for  abdominal pain, diarrhea, nausea and vomiting.  Musculoskeletal: Negative for arthralgias, back pain and myalgias.  Skin: Negative for rash.  Neurological: Positive for headaches (frontal). Negative for dizziness and light-headedness.     Physical Exam Triage Vital Signs ED Triage Vitals  Enc Vitals Group     BP 07/16/17 1247 119/77     Pulse Rate 07/16/17 1247 80     Resp 07/16/17 1247 16     Temp 07/16/17 1247 98 F (36.7 C)     Temp Source 07/16/17 1247 Oral     SpO2 07/16/17 1248 100 %     Weight 07/16/17 1248 178 lb (80.7 kg)     Height 07/16/17 1248 5\' 3"  (1.6 m)     Head Circumference --      Peak Flow --      Pain Score 07/16/17 1249 7     Pain Loc --      Pain Edu? --      Excl. in [place]? --    No data found.   Updated Vital Signs BP 119/77 (BP Location: Left Arm)   Pulse 80   Temp 98 F (36.7 C) (Oral)   Resp 16   Ht 5\' 3"  (1.6 m)   Wt 178 lb (80.7 kg)   SpO2 100%   BMI 31.53 kg/m   Visual Acuity Right Eye Distance:   Left Eye Distance:   Bilateral Distance:    Right Eye Near:   Left Eye Near:    Bilateral Near:     Physical Exam  Constitutional: She is oriented to person, place, and time. She appears well-developed and well-nourished. No distress.  HENT:  Head: Normocephalic and atraumatic.  Right Ear: Tympanic membrane normal.  Left Ear: Tympanic membrane normal.  Nose: Mucosal edema present. Right sinus exhibits maxillary sinus tenderness and frontal sinus tenderness. Left sinus exhibits maxillary sinus tenderness and frontal sinus tenderness.  Mouth/Throat: Uvula is midline, oropharynx is clear and moist and mucous membranes are normal.  Eyes: EOM are normal.  Neck: Normal range of motion. Neck supple.  Cardiovascular: Normal rate and regular rhythm.   Pulmonary/Chest: Effort normal and breath sounds normal. No stridor. No respiratory distress. She has no wheezes. She has no rales.  Musculoskeletal: Normal range of motion.  Lymphadenopathy:     She has no cervical adenopathy.  Neurological: She is alert and oriented to person, place, and time.  Skin: Skin is warm and dry. She is not diaphoretic.  Psychiatric: She has a normal mood and affect. Her behavior is normal.  Nursing note and vitals reviewed.    UC Treatments / Results  Labs (all labs ordered are listed, but only abnormal results are displayed) Labs Reviewed - No data to display  EKG  EKG Interpretation None       Radiology No  results found.  Procedures Procedures (including critical care time)  Medications Ordered in UC Medications - No data to display   Initial Impression / Assessment and Plan / UC Course  I have reviewed the triage vital signs and the nursing notes.  Pertinent labs & imaging results that were available during my care of the patient were reviewed by me and considered in my medical decision making (see chart for details).     Hx and exam c/w sinus headache and congestion, likely viral in nature given short duration and lack of fever. Encouraged symptomatic treatment with sinus rinses and prednisone. Pt adamant she does not want prednisone due to associated weight gain.  Prescription for amoxicillin provided to hold with expiration date. May start if persistent fever develops or if not improving in 1 week. Home care instructions provided F/u with PCP as needed.   Final Clinical Impressions(s) / UC Diagnoses   Final diagnoses:  Sinus congestion  Sinus headache    New Prescriptions Discharge Medication List as of 07/16/2017  1:02 PM    START taking these medications   Details  amoxicillin (AMOXIL) 500 MG capsule Take 1 capsule (500 mg total) by mouth 3 (three) times daily., Starting Sun 07/16/2017, Print         Controlled Substance Prescriptions [place] Controlled Substance Registry consulted? Not Applicable   Tyrell Antonio 07/16/17 1348

## 2017-07-20 ENCOUNTER — Encounter: Payer: Self-pay | Admitting: Family Medicine

## 2017-07-20 ENCOUNTER — Ambulatory Visit (INDEPENDENT_AMBULATORY_CARE_PROVIDER_SITE_OTHER): Payer: Medicare Other | Admitting: Family Medicine

## 2017-07-20 VITALS — BP 141/78 | HR 82 | Wt 179.0 lb

## 2017-07-20 DIAGNOSIS — R21 Rash and other nonspecific skin eruption: Secondary | ICD-10-CM | POA: Diagnosis not present

## 2017-07-20 MED ORDER — FLUOCINONIDE 0.05 % EX OINT: 1.0000 "application " | TOPICAL_OINTMENT | Freq: Two times a day (BID) | CUTANEOUS | 0 refills | Status: DC

## 2017-07-20 NOTE — Patient Instructions (Addendum)
Thank you for coming in today. Apply the ointment twice daily.  STOP the ointment when it  Recheck as needed.    Contact Dermatitis Dermatitis is redness, soreness, and swelling (inflammation) of the skin. Contact dermatitis is a reaction to certain substances that touch the skin. You either touched something that irritated your skin, or you have allergies to something you touched. Follow these instructions at home: Cascade your skin as needed.  Apply cool compresses to the affected areas.  Try taking a bath with: ? Epsom salts. Follow the instructions on the package. You can get these at a pharmacy or grocery store. ? Baking soda. Pour a small amount into the bath as told by your doctor. ? Colloidal oatmeal. Follow the instructions on the package. You can get this at a pharmacy or grocery store.  Try applying baking soda paste to your skin. Stir water into baking soda until it looks like paste.  Do not scratch your skin.  Bathe less often.  Bathe in lukewarm water. Avoid using hot water. Medicines  Take or apply over-the-counter and prescription medicines only as told by your doctor.  If you were prescribed an antibiotic medicine, take or apply your antibiotic as told by your doctor. Do not stop taking the antibiotic even if your condition starts to get better. General instructions  Keep all follow-up visits as told by your doctor. This is important.  Avoid the substance that caused your reaction. If you do not know what caused it, keep a journal to try to track what caused it. Write down: ? What you eat. ? What cosmetic products you use. ? What you drink. ? What you wear in the affected area. This includes jewelry.  If you were given a bandage (dressing), take care of it as told by your doctor. This includes when to change and remove it. Contact a doctor if:  You do not get better with treatment.  Your condition gets worse.  You have signs of infection  such as: ? Swelling. ? Tenderness. ? Redness. ? Soreness. ? Warmth.  You have a fever.  You have new symptoms. Get help right away if:  You have a very bad headache.  You have neck pain.  Your neck is stiff.  You throw up (vomit).  You feel very sleepy.  You see red streaks coming from the affected area.  Your bone or joint underneath the affected area becomes painful after the skin has healed.  The affected area turns darker.  You have trouble breathing. This information is not intended to replace advice given to you by your health care provider. Make sure you discuss any questions you have with your health care provider. Document Released: 08/21/2009 Document Revised: 03/31/2016 Document Reviewed: 03/11/2015 Elsevier Interactive Patient Education  2018 [employer].

## 2017-07-20 NOTE — Progress Notes (Signed)
Janet Richards is a 56 y.o. female who presents to [facility] today for rash. Patient developed a small mildly itchy rash on her right chest wall yesterday. She's not sure what may have caused it. She denies any new soaps detergents or shampoos. She denies fevers or chills nausea vomiting or diarrhea feels well otherwise. She used some leftover Lidex ointment which has helped although she is nearly out of this medication.   Past Medical History:  Diagnosis Date  . Essential hypertension, benign   . Menstrual migraine   . Periodontitis   . Primary osteoarthritis of both knees   . Urticaria    Past Surgical History:  Procedure Laterality Date  . TUBAL LIGATION  12/26/1987   Social History  Substance Use Topics  . Smoking status: Former Smoker    Packs/day: 1.50    Types: Cigarettes    Quit date: 02/05/2006  . Smokeless tobacco: Never Used  . Alcohol use Yes     Comment: rarely   family history includes CAD in her father; Hypertension in her mother.  ROS as above:  Medications: Current Outpatient Prescriptions  Medication Sig Dispense Refill  . amoxicillin (AMOXIL) 500 MG capsule Take 1 capsule (500 mg total) by mouth 3 (three) times daily. 21 capsule 0  . cetirizine HCl (ZYRTEC) 1 MG/ML solution TAKE 10 MLS (10 MG TOTAL) BY MOUTH DAILY. 944 mL 4  . conjugated estrogens (PREMARIN) vaginal cream Place 1 Applicatorful vaginally daily. For first 2 weeks then decrease to 3 times a week. 42.5 g 12  . hydrochlorothiazide (HYDRODIURIL) 12.5 MG tablet Take 1 tablet (12.5 mg total) by mouth daily. 30 tablet 3  . montelukast (SINGULAIR) 10 MG tablet Take 10 mg by mouth at bedtime.     . pantoprazole (PROTONIX) 40 MG tablet TAKE 1 TABLET (40 MG TOTAL) BY MOUTH DAILY. 90 tablet 1  . albuterol (PROVENTIL HFA;VENTOLIN HFA) 108 (90 Base) MCG/ACT inhaler Inhale two puffs  every 4-6 hours only as needed for shortness of breath or wheezing. 1 Inhaler 1  . fluocinonide ointment (LIDEX) 8.54 % Apply 1 application topically 2 (two) times daily. 30 g 0   No current facility-administered medications for this visit.    Allergies  Allergen Reactions  . Azithromycin Nausea And Vomiting  . Clindamycin/Lincomycin Hives    Facial swelling  . Metronidazole Hives    Rash, has had fluconazole (Diflucan) w/o reaction  . Sulfa Antibiotics Hives    Health Maintenance Health Maintenance  Topic Date Due  . Hepatitis C Screening  06[birth year]  . MAMMOGRAM  08/16/2018  . PAP SMEAR  10/26/2019  . TETANUS/TDAP  10/17/2024  . COLONOSCOPY  03/19/2025  . INFLUENZA VACCINE  Completed  . HIV Screening  Completed     Exam:  BP (!) 141/78   Pulse 82   Wt 179 lb (81.2 kg)   BMI 31.71 kg/m  Gen: Well NAD   Skin: Small maculopapular erythematous rash right chest wall   No results found for this or any previous visit (from the past 72 hour(s)). No results found.    Assessment and Plan: 56 y.o. female with rash concerning for contact dermatitis. I prescribed Lidex ointment as this is working well. Cautioned against overuse of this medication. Recheck if not improving.   No orders of the defined types were placed in this encounter.  Meds ordered this encounter  Medications  . fluocinonide ointment (LIDEX) 0.05 %  Sig: Apply 1 application topically 2 (two) times daily.    Dispense:  30 g    Refill:  0     Discussed warning signs or symptoms. Please see discharge instructions. Patient expresses understanding.

## 2017-08-08 ENCOUNTER — Ambulatory Visit: Payer: Self-pay | Admitting: Physician Assistant

## 2017-08-08 ENCOUNTER — Ambulatory Visit (INDEPENDENT_AMBULATORY_CARE_PROVIDER_SITE_OTHER): Payer: Medicare Other | Admitting: Family Medicine

## 2017-08-08 ENCOUNTER — Encounter: Payer: Self-pay | Admitting: Family Medicine

## 2017-08-08 VITALS — BP 150/79 | HR 75 | Temp 98.2°F | Wt 183.0 lb

## 2017-08-08 DIAGNOSIS — K047 Periapical abscess without sinus: Secondary | ICD-10-CM

## 2017-08-08 MED ORDER — CIPROFLOXACIN HCL 500 MG PO TABS: 500.0000 mg | ORAL_TABLET | Freq: Two times a day (BID) | ORAL | 1 refills | Status: DC

## 2017-08-08 NOTE — Progress Notes (Signed)
   Subjective:    I'm seeing this patient as a consultation for: Iran Planas, PA  CC: Tooth pain and headache  HPI: Patient reports 5 days of tooth pain, headache, and subjective fevers. She states that she feels ill and thinks she has a tooth infection. Patient states that the painful tooth is a temporary partial tooth and she is scheduled to see her dentist next week for evaluation. Patient has not tried anything to help with the tooth pain or headache. She describes the headache as throbbing, bilateral, and similar to ones she has had before.   Past medical history, Surgical history, Family history not pertinant except as noted below, Social history, Allergies, and medications have been entered into the medical record, reviewed, and no changes needed.   Review of Systems: No visual changes, nausea, vomiting, diarrhea, constipation, dizziness, abdominal pain, skin rash, chills, night sweats, weight loss, swollen lymph nodes, body aches, joint swelling, muscle aches, chest pain, shortness of breath, mood changes, visual or auditory hallucinations.   Objective:    Vitals:   08/08/17 1055  BP: (!) 150/79  Pulse: 75  Temp: 98.2 F (36.8 C)   General: Well Developed, well nourished, and in no acute distress.  Neuro/Psych: Alert and oriented x3, extra-ocular muscles intact, able to move all 4 extremities, sensation grossly intact, racing thoughts, tone is appropriate, normal affect Oral cavity: Temporary tooth in upper right, central dark purple discoloration present on last molar, yellow plaque surrounding last molar with mucous present between tooth and gums Skin: Warm and dry, no rashes noted.  Respiratory: Not using accessory muscles, speaking in full sentences, trachea midline.  Cardiovascular: Pulses palpable, no extremity edema. Abdomen: Does not appear distended.   No results found for this or any previous visit (from the past 24 hour(s)). No results found.  Impression and  Recommendations:    Assessment and Plan: 56 y.o. female with tooth pain and headache. Given patient's history of dental infection and current symptoms, it is reasonable to treat for a presumed dental infection. Patient has previously done well with ciprofloxacin. She has a dental appointment scheduled for next week to further evaluate tooth and determine if there is a need to replace it. Patient was advised to take full course of antibiotics for 1 week and follow-up if symptoms do not improve or acutely worsen.   No orders of the defined types were placed in this encounter.  Meds ordered this encounter  Medications  . ciprofloxacin (CIPRO) 500 MG tablet    Sig: Take 1 tablet (500 mg total) by mouth 2 (two) times daily.    Dispense:  14 tablet    Refill:  1    Discussed warning signs or symptoms. Please see discharge instructions. Patient expresses understanding.  I spent 25 minutes with this patient, greater than 50% was face-to-face time counseling regarding ddx and treatment plan. [name]

## 2017-08-08 NOTE — Patient Instructions (Signed)
Thank you for coming in today. Use a short course of cipro for infection.  Follow up with your dentist.  Recheck with me or Jade as needed.

## 2017-08-18 ENCOUNTER — Ambulatory Visit: Payer: Self-pay

## 2017-08-18 ENCOUNTER — Ambulatory Visit (INDEPENDENT_AMBULATORY_CARE_PROVIDER_SITE_OTHER): Payer: Medicare Other

## 2017-08-18 DIAGNOSIS — Z1231 Encounter for screening mammogram for malignant neoplasm of breast: Secondary | ICD-10-CM | POA: Diagnosis not present

## 2017-08-30 ENCOUNTER — Ambulatory Visit: Payer: Self-pay | Admitting: Family Medicine

## 2017-09-01 ENCOUNTER — Emergency Department (INDEPENDENT_AMBULATORY_CARE_PROVIDER_SITE_OTHER)
Admission: EM | Admit: 2017-09-01 | Discharge: 2017-09-01 | Disposition: A | Discharge status: Home / Self Care | Payer: Medicare Other | Source: Home / Self Care | Attending: Family Medicine | Admitting: Family Medicine

## 2017-09-01 ENCOUNTER — Encounter: Payer: Self-pay | Admitting: Emergency Medicine

## 2017-09-01 DIAGNOSIS — G8929 Other chronic pain: Secondary | ICD-10-CM | POA: Diagnosis not present

## 2017-09-01 DIAGNOSIS — K089 Disorder of teeth and supporting structures, unspecified: Secondary | ICD-10-CM | POA: Diagnosis not present

## 2017-09-01 MED ORDER — IBUPROFEN 600 MG PO TABS: 600.0000 mg | ORAL_TABLET | Freq: Four times a day (QID) | ORAL | 0 refills | Status: DC | PRN

## 2017-09-01 NOTE — ED Provider Notes (Signed)
[name]    CSN: [id number] Arrival date & time: 09/01/17  1839     History   Chief Complaint Chief Complaint  Patient presents with  . Dental Pain  . Headache    HPI Janet Richards is a 56 y.o. female.   HPI  Janet Richards is a 56 y.o. female presenting to UC with c/o Right side upper jaw pain, Right ear pain and Right sided headache.  She had a tooth extracted in Right upper jaw on 08/25/17 and was on Ciprofloxacin (according to medical records) 7 days prior to extraction to cover until procedure could be completed.  She had mild pain after extraction but now has severe aching pain.  She was seen by her dentist 2 days ago for regular cleaning and was advised the extraction site appeared to be healing well.  She was having some pain at that time but she was advised to continue taking ibuprofen.  She ran out of the prescribed Motrin.  She did have 4 baby aspirin once today with moderate relief. Denies fever, chills, n/v/d.    Past Medical History:  Diagnosis Date  . Essential hypertension, benign   . Menstrual migraine   . Periodontitis   . Primary osteoarthritis of both knees   . Urticaria     Patient Active Problem List   Diagnosis Date Noted  . Vaginal dryness, menopausal 03/01/2017  . Neuropathy 03/01/2017  . Abnormal ankle brachial index (ABI) 11/09/2016  . Burning sensation of foot 09/02/2016  . Ganglion cyst of right foot 07/12/2016  . Chronic dental infection 04/11/2016  . Anemia, iron deficiency 02/25/2016  . Right ankle pain 02/24/2016  . Fatigue 02/24/2016  . Paresthesias 07/01/2015  . History of colonic polyps 03/20/2015  . Hyperlipidemia 02/20/2015  . Chronic allergic rhinitis 01/14/2015  . IFG (impaired fasting glucose) 10/17/2014  . Osteoarthritis of first metatarsophalangeal joint 04/11/2014  . 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 No blood products 03/28/2013  . Essential hypertension, benign 03/01/2013  . Exertional  chest pain 02/05/2013  . Abnormal uterine bleeding 02/05/2013  . Primary osteoarthritis of both knees 12/27/2012    Past Surgical History:  Procedure Laterality Date  . TUBAL LIGATION  12/26/1987    OB History    Gravida Para Term Preterm AB Living   5 3 3   2 3    SAB TAB Ectopic Multiple Live Births     2             Home Medications    Prior to Admission medications   Medication Sig Start Date End Date Taking? Authorizing Provider  albuterol (PROVENTIL HFA;VENTOLIN HFA) 108 (90 Base) MCG/ACT inhaler Inhale two puffs every 4-6 hours only as needed for shortness of breath or wheezing. 01/19/16 01/18/17  [name], MD  cetirizine HCl (ZYRTEC) 1 MG/ML solution TAKE 10 MLS (10 MG TOTAL) BY MOUTH DAILY. 03/20/17   Breeback, Royetta Car, PA-C  ciprofloxacin (CIPRO) 500 MG tablet Take 1 tablet (500 mg total) by mouth 2 (two) times daily. 08/08/17   [name], MD  conjugated estrogens (PREMARIN) vaginal cream Place 1 Applicatorful vaginally daily. For first 2 weeks then decrease to 3 times a week. 03/01/17   Breeback, Jade L, PA-C  fluocinonide ointment (LIDEX) 4.40 % Apply 1 application topically 2 (two) times daily. 07/20/17   [name], MD  hydrochlorothiazide (HYDRODIURIL) 12.5 MG tablet Take 1 tablet (12.5 mg total) by mouth daily. 07/11/17   Breeback,  Jade L, PA-C  ibuprofen (ADVIL,MOTRIN) 600 MG tablet Take 1 tablet (600 mg total) by mouth every 6 (six) hours as needed. 09/01/17   Noe Gens, PA-C  montelukast (SINGULAIR) 10 MG tablet Take 10 mg by mouth at bedtime.  04/04/13   [provider]  pantoprazole (PROTONIX) 40 MG tablet TAKE 1 TABLET (40 MG TOTAL) BY MOUTH DAILY. 04/28/17   [name], PA-C    Family History Family History  Problem Relation Age of Onset  . Hypertension Mother   . CAD Father        MI in his 48s    Social History Social History  Substance Use Topics  . Smoking status: Former Smoker    Packs/day: 1.50    Types: Cigarettes     Quit date: 02/05/2006  . Smokeless tobacco: Never Used  . Alcohol use Yes     Comment: rarely     Allergies   Azithromycin; Clindamycin/lincomycin; Metronidazole; and Sulfa antibiotics   Review of Systems Review of Systems  Constitutional: Negative for chills and fever.  HENT: Positive for dental problem. Negative for facial swelling, sore throat, trouble swallowing and voice change.   Gastrointestinal: Negative for nausea and vomiting.  Neurological: Positive for headaches (Right side). Negative for dizziness and light-headedness.     Physical Exam Triage Vital Signs ED Triage Vitals  Enc Vitals Group     BP      Pulse      Resp      Temp      Temp src      SpO2      Weight      Height      Head Circumference      Peak Flow      Pain Score      Pain Loc      Pain Edu?      Excl. in [place]?    No data found.   Updated Vital Signs BP (!) 154/89 (BP Location: Left Arm)   Pulse 83   Temp 98.4 F (36.9 C) (Oral)   Resp 16   Ht 5\' 3"  (1.6 m)   Wt 176 lb (79.8 kg)   LMP 08/30/2017 (Exact Date)   SpO2 98%   BMI 31.18 kg/m   Visual Acuity Right Eye Distance:   Left Eye Distance:   Bilateral Distance:    Right Eye Near:   Left Eye Near:    Bilateral Near:     Physical Exam  Constitutional: She is oriented to person, place, and time. She appears well-developed and well-nourished. No distress.  HENT:  Head: Normocephalic and atraumatic.  Right Ear: Tympanic membrane normal.  Left Ear: Tympanic membrane normal.  Nose: Nose normal.  Mouth/Throat: Uvula is midline, oropharynx is clear and moist and mucous membranes are normal. No trismus in the jaw. Abnormal dentition.    Right upper gingiva: molar missing c/w recent extraction. Partial clot visualized, questionable dry socket. No discharge or active bleeding.   Eyes: EOM are normal.  Neck: Normal range of motion. Neck supple.  Cardiovascular: Normal rate.   Pulmonary/Chest: Effort normal. No stridor. No  respiratory distress.  Musculoskeletal: Normal range of motion.  Lymphadenopathy:    She has no cervical adenopathy.  Neurological: She is alert and oriented to person, place, and time.  Skin: Skin is warm and dry. She is not diaphoretic.  Psychiatric: She has a normal mood and affect. Her behavior is normal.  Nursing note and vitals reviewed.  UC Treatments / Results  Labs (all labs ordered are listed, but only abnormal results are displayed) Labs Reviewed - No data to display  EKG  EKG Interpretation None       Radiology No results found.  Procedures Procedures (including critical care time)  Medications Ordered in UC Medications - No data to display   Initial Impression / Assessment and Plan / UC Course  I have reviewed the triage vital signs and the nursing notes.  Pertinent labs & imaging results that were available during my care of the patient were reviewed by me and considered in my medical decision making (see chart for details).     Pt c/o dental pain resulting in Right sided headache. Recent tooth extraction. Dentist 2 days ago stated site was healing well.   Will refill pt's Motrin and encouraged her to call oral surgeon on Monday for potential dry socket  Encouraged to stay well hydrated.   Final Clinical Impressions(s) / UC Diagnoses   Final diagnoses:  Chronic dental pain    New Prescriptions Discharge Medication List as of 09/01/2017  7:11 PM    START taking these medications   Details  ibuprofen (ADVIL,MOTRIN) 600 MG tablet Take 1 tablet (600 mg total) by mouth every 6 (six) hours as needed., Starting Fri 09/01/2017, Normal         Controlled Substance Prescriptions [place] Controlled Substance Registry consulted? Not Applicable   Tyrell Antonio 09/01/17 2001

## 2017-09-01 NOTE — ED Triage Notes (Signed)
Patient had tooth extracted upper right molar on 08-25-17; had been placed on amoxicillin for 7 days prior to extraction to cover until procedure could be done. Had mild pain after extraction , but now reports severe aching over right mandibular and temporal area.

## 2017-09-01 NOTE — Discharge Instructions (Signed)
°  You may have acetaminophen and ibuprofen (as prescribed) to help with dental pain and headache.   It is important that you call your oral surgeon on Monday to schedule a follow up appointment.  You may have a dry socket, which can cause worsening pain.  The surgeon or dentist may apply a special paste to help with pain and healing.

## 2017-09-05 ENCOUNTER — Encounter (HOSPITAL_BASED_OUTPATIENT_CLINIC_OR_DEPARTMENT_OTHER): Payer: Self-pay | Admitting: Emergency Medicine

## 2017-09-05 ENCOUNTER — Emergency Department (HOSPITAL_BASED_OUTPATIENT_CLINIC_OR_DEPARTMENT_OTHER): Payer: Medicare Other

## 2017-09-05 ENCOUNTER — Emergency Department (HOSPITAL_BASED_OUTPATIENT_CLINIC_OR_DEPARTMENT_OTHER)
Admission: EM | Admit: 2017-09-05 | Discharge: 2017-09-05 | Disposition: A | Discharge status: Home / Self Care | Payer: Medicare Other | Attending: Emergency Medicine | Admitting: Emergency Medicine

## 2017-09-05 DIAGNOSIS — Z79899 Other long term (current) drug therapy: Secondary | ICD-10-CM | POA: Insufficient documentation

## 2017-09-05 DIAGNOSIS — I10 Essential (primary) hypertension: Secondary | ICD-10-CM | POA: Insufficient documentation

## 2017-09-05 DIAGNOSIS — Z87891 Personal history of nicotine dependence: Secondary | ICD-10-CM | POA: Insufficient documentation

## 2017-09-05 DIAGNOSIS — R072 Precordial pain: Secondary | ICD-10-CM | POA: Insufficient documentation

## 2017-09-05 DIAGNOSIS — R079 Chest pain, unspecified: Secondary | ICD-10-CM | POA: Diagnosis present

## 2017-09-05 LAB — BASIC METABOLIC PANEL
Anion gap: 7 (ref 5–15)
BUN: 15 mg/dL (ref 6–20)
CO2: 28 mmol/L (ref 22–32)
Calcium: 8.9 mg/dL (ref 8.9–10.3)
Chloride: 103 mmol/L (ref 101–111)
Creatinine, Ser: 0.85 mg/dL (ref 0.44–1.00)
GFR calc Af Amer: >60 mL/min (ref >60)
GFR calc non Af Amer: >60 mL/min (ref >60)
Glucose, Bld: 89 mg/dL (ref 65–99)
Potassium: 3.4 mmol/L — ABNORMAL LOW (ref 3.5–5.1)
Sodium: 138 mmol/L (ref 135–145)

## 2017-09-05 LAB — CBC
HCT: 38.6 % (ref 36.0–46.0)
Hemoglobin: 13 g/dL (ref 12.0–15.0)
MCH: 30 pg (ref 26.0–34.0)
MCHC: 33.7 g/dL (ref 30.0–36.0)
MCV: 88.9 fL (ref 78.0–100.0)
Platelets: 415 10*3/uL — ABNORMAL HIGH (ref 150–400)
RBC: 4.34 MIL/uL (ref 3.87–5.11)
RDW: 13.9 % (ref 11.5–15.5)
WBC: 5.9 10*3/uL (ref 4.0–10.5)

## 2017-09-05 LAB — TROPONIN I: Troponin I: <0.03 ng/mL (ref <0.03)

## 2017-09-05 NOTE — Discharge Instructions (Signed)
1.  Continue your daily Protonix.  Take one enteric-coated baby aspirin daily until you follow-up with your doctor. 2.  Return to the emergency department if you have recurrence of pain, if symptoms are worsening and changing or other concerns develop. 3.  Discuss outpatient cardiac stress testing with your doctor. 4.  Follow in obstructions for dietary measures for reflux.

## 2017-09-05 NOTE — ED Provider Notes (Signed)
[facility] Provider Note   CSN: [id number] Arrival date & time: 09/05/17  1539     History   Chief Complaint Chief Complaint  Patient presents with  . Chest Pain    HPI Janet Richards is a 56 y.o. female.  HPI Patient reports that she has been having nighttime chest pain that is burning and central in quality.  She reports she has had to sit up to alleviate it.  Patient has just finished a course of amoxicillin which she was taking for recent dental procedure.  She does take daily Protonix and took an extra dose yesterday.  She reports today when at rest she developed a central and slightly left chest pain that was aching and burning in quality.  Ports it lasted about 5 minutes.  He became concerned and sought evaluation at the emergency department.  He has no cardiac history.  She denies any swelling or pain of the lower extremities.  Patient reports her father had cardiac disease but he was a heavy smoker and alcohol drinker, she reports no other family members had early onset coronary artery disease. Past Medical History:  Diagnosis Date  . Essential hypertension, benign   . Menstrual migraine   . Periodontitis   . Primary osteoarthritis of both knees   . Urticaria     Patient Active Problem List   Diagnosis Date Noted  . Vaginal dryness, menopausal 03/01/2017  . Neuropathy 03/01/2017  . Abnormal ankle brachial index (ABI) 11/09/2016  . Burning sensation of foot 09/02/2016  . Ganglion cyst of right foot 07/12/2016  . Chronic dental infection 04/11/2016  . Anemia, iron deficiency 02/25/2016  . Right ankle pain 02/24/2016  . Fatigue 02/24/2016  . Paresthesias 07/01/2015  . History of colonic polyps 03/20/2015  . Hyperlipidemia 02/20/2015  . Chronic allergic rhinitis 01/14/2015  . IFG (impaired fasting glucose) 10/17/2014  . Osteoarthritis of first metatarsophalangeal joint 04/11/2014  . 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 No blood  products 03/28/2013  . Essential hypertension, benign 03/01/2013  . Exertional chest pain 02/05/2013  . Abnormal uterine bleeding 02/05/2013  . Primary osteoarthritis of both knees 12/27/2012    Past Surgical History:  Procedure Laterality Date  . TUBAL LIGATION  12/26/1987    OB History    Gravida Para Term Preterm AB Living   5 3 3   2 3    SAB TAB Ectopic Multiple Live Births     2             Home Medications    Prior to Admission medications   Medication Sig Start Date End Date Taking? Authorizing Provider  albuterol (PROVENTIL HFA;VENTOLIN HFA) 108 (90 Base) MCG/ACT inhaler Inhale two puffs every 4-6 hours only as needed for shortness of breath or wheezing. 01/19/16 01/18/17  [name], MD  cetirizine HCl (ZYRTEC) 1 MG/ML solution TAKE 10 MLS (10 MG TOTAL) BY MOUTH DAILY. 03/20/17   Breeback, Royetta Car, PA-C  ciprofloxacin (CIPRO) 500 MG tablet Take 1 tablet (500 mg total) by mouth 2 (two) times daily. 08/08/17   [name], MD  conjugated estrogens (PREMARIN) vaginal cream Place 1 Applicatorful vaginally daily. For first 2 weeks then decrease to 3 times a week. 03/01/17   Breeback, Jade L, PA-C  fluocinonide ointment (LIDEX) 3.47 % Apply 1 application topically 2 (two) times daily. 07/20/17   [name], MD  hydrochlorothiazide (HYDRODIURIL) 12.5 MG tablet Take 1 tablet (12.5 mg total) by mouth daily. 07/11/17  Breeback, Jade L, PA-C  ibuprofen (ADVIL,MOTRIN) 600 MG tablet Take 1 tablet (600 mg total) by mouth every 6 (six) hours as needed. 09/01/17   Noe Gens, PA-C  montelukast (SINGULAIR) 10 MG tablet Take 10 mg by mouth at bedtime.  04/04/13   [provider]  pantoprazole (PROTONIX) 40 MG tablet TAKE 1 TABLET (40 MG TOTAL) BY MOUTH DAILY. 04/28/17   [name], PA-C    Family History Family History  Problem Relation Age of Onset  . Hypertension Mother   . CAD Father        MI in his 33s    Social History Social History  Substance Use Topics    . Smoking status: Former Smoker    Packs/day: 1.50    Types: Cigarettes    Quit date: 02/05/2006  . Smokeless tobacco: Never Used  . Alcohol use Yes     Comment: rarely     Allergies   Azithromycin; Clindamycin/lincomycin; Metronidazole; and Sulfa antibiotics   Review of Systems Review of Systems 10 Systems reviewed and are negative for acute change except as noted in the HPI.   Physical Exam Updated Vital Signs BP (!) 132/98 (BP Location: Left Arm)   Pulse 86   Temp 98.6 F (37 C) (Oral)   Resp 16   Ht 5\' 3"  (1.6 m)   Wt 79.8 kg (176 lb)   LMP 08/30/2017 (Exact Date)   SpO2 100%   BMI 31.18 kg/m   Physical Exam  Constitutional: She is oriented to person, place, and time. She appears well-developed and well-nourished. No distress.  HENT:  Head: Normocephalic and atraumatic.  Mouth/Throat: Oropharynx is clear and moist.  Eyes: Conjunctivae and EOM are normal.  Neck: Neck supple.  Cardiovascular: Normal rate, regular rhythm, normal heart sounds and intact distal pulses.   No murmur heard. Pulmonary/Chest: Effort normal and breath sounds normal. No respiratory distress.  Abdominal: Soft. She exhibits no distension. There is no tenderness. There is no guarding.  Musculoskeletal: Normal range of motion. She exhibits no edema or tenderness.  Neurological: She is alert and oriented to person, place, and time. No cranial nerve deficit. She exhibits normal muscle tone. Coordination normal.  Skin: Skin is warm and dry.  Psychiatric: She has a normal mood and affect.  Nursing note and vitals reviewed.    ED Treatments / Results  Labs (all labs ordered are listed, but only abnormal results are displayed) Labs Reviewed  BASIC METABOLIC PANEL - Abnormal; Notable for the following:       Result Value   Potassium 3.4 (*)    All other components within normal limits  CBC - Abnormal; Notable for the following:    Platelets 415 (*)    All other components within normal  limits  TROPONIN I    EKG  EKG Interpretation  Date/Time:  Tuesday September 05 2017 15:46:58 EDT Ventricular Rate:  83 PR Interval:  140 QRS Duration: 90 QT Interval:  378 QTC Calculation: 444 R Axis:   69 Text Interpretation:  Normal sinus rhythm Normal ECG no change from previous Confirmed by Charlesetta Shanks [id number]) on 09/05/2017 4:26:47 PM       Radiology Dg Chest 2 View  Result Date: 09/05/2017 CLINICAL DATA:  Acute chest pain. EXAM: CHEST  2 VIEW COMPARISON:  Radiographs of May 23, 2016. FINDINGS: The heart size and mediastinal contours are within normal limits. Both lungs are clear. No pneumothorax or pleural effusion is noted. The visualized skeletal structures are  unremarkable. IMPRESSION: No active cardiopulmonary disease. Electronically Signed   By: Marijo Conception, M.D.   On: 09/05/2017 16:12    Procedures Procedures (including critical care time)  Medications Ordered in ED Medications - No data to display   Initial Impression / Assessment and Plan / ED Course  I have reviewed the triage vital signs and the nursing notes.  Pertinent labs & imaging results that were available during my care of the patient were reviewed by me and considered in my medical decision making (see chart for details).     Final Clinical Impressions(s) / ED Diagnoses   Final diagnoses:  Precordial pain  She has had recurrent chest pain for a couple of days.  Typically it has been occurring at night while lying supine and improved with sitting up.  He does have known history of reflux.  Patient just finished a course of amoxicillin for dental procedure.  Higher suspicion for GI etiology.  EKG is normal and troponin is negative.  Patient is counseled on signs and symptoms for which to return.  She is counseled on discussing outpatient stress testing with her PCP.  Patient will continue her Protonix and take an enteric-coated baby aspirin until her follow-up. New Prescriptions New  Prescriptions   No medications on file     Charlesetta Shanks, MD 09/05/17 1659

## 2017-09-05 NOTE — ED Triage Notes (Signed)
Intermittent chest pain x 3 days, with pain to L shoulder.

## 2017-09-11 ENCOUNTER — Ambulatory Visit (INDEPENDENT_AMBULATORY_CARE_PROVIDER_SITE_OTHER): Payer: Medicare Other | Admitting: Family Medicine

## 2017-09-11 ENCOUNTER — Encounter: Payer: Self-pay | Admitting: Family Medicine

## 2017-09-11 VITALS — BP 108/60 | HR 79 | Temp 98.3°F | Wt 184.0 lb

## 2017-09-11 DIAGNOSIS — R300 Dysuria: Secondary | ICD-10-CM

## 2017-09-11 DIAGNOSIS — H60501 Unspecified acute noninfective otitis externa, right ear: Secondary | ICD-10-CM | POA: Diagnosis not present

## 2017-09-11 LAB — POCT URINALYSIS DIPSTICK
Bilirubin, UA: NEGATIVE
Glucose, UA: NEGATIVE
Ketones, UA: NEGATIVE
Leukocytes, UA: NEGATIVE
Nitrite, UA: NEGATIVE
Protein, UA: NEGATIVE
Spec Grav, UA: 1.02 (ref 1.010–1.025)
Urobilinogen, UA: 0.2 E.U./dL
pH, UA: 6.5 (ref 5.0–8.0)

## 2017-09-11 MED ORDER — NEOMYCIN-POLYMYXIN-HC 3.5-10000-1 OT SOLN: 4.0000 [drp] | Freq: Four times a day (QID) | OTIC | 0 refills | Status: DC

## 2017-09-11 NOTE — Progress Notes (Signed)
Janet Richards is a 56 y.o. female who presents to [facility] today for right-sided ear pain. Patient notes a several day history of right-sided ear pain. No fevers chills nausea vomiting diarrhea. No chest pain palpitations or shortness of breath. No treatment tried yet. She feels well otherwise.   Additionally patient has minimal dysuria and is worried about a urinary tract infection. She notes her symptoms are not consistent with UTI and had been ongoing only for one day. She denies any vaginal discharge abdominal pain fevers or chills.  Past Medical History:  Diagnosis Date  . Essential hypertension, benign   . Menstrual migraine   . Periodontitis   . Primary osteoarthritis of both knees   . Urticaria    Past Surgical History:  Procedure Laterality Date  . TUBAL LIGATION  12/26/1987   Social History   Tobacco Use  . Smoking status: Former Smoker    Packs/day: 1.50    Types: Cigarettes    Last attempt to quit: 02/05/2006    Years since quitting: 11.6  . Smokeless tobacco: Never Used  Substance Use Topics  . Alcohol use: Yes    Comment: rarely   family history includes CAD in her father; Hypertension in her mother.  ROS as above:  Medications: Current Outpatient Medications  Medication Sig Dispense Refill  . cetirizine HCl (ZYRTEC) 1 MG/ML solution TAKE 10 MLS (10 MG TOTAL) BY MOUTH DAILY. 944 mL 4  . ciprofloxacin (CIPRO) 500 MG tablet Take 1 tablet (500 mg total) by mouth 2 (two) times daily. 14 tablet 1  . conjugated estrogens (PREMARIN) vaginal cream Place 1 Applicatorful vaginally daily. For first 2 weeks then decrease to 3 times a week. 42.5 g 12  . fluocinonide ointment (LIDEX) 4.58 % Apply 1 application topically 2 (two) times daily. 30 g 0  . hydrochlorothiazide (HYDRODIURIL) 12.5 MG tablet Take 1 tablet (12.5 mg total) by mouth daily. 30  tablet 3  . ibuprofen (ADVIL,MOTRIN) 600 MG tablet Take 1 tablet (600 mg total) by mouth every 6 (six) hours as needed. 30 tablet 0  . montelukast (SINGULAIR) 10 MG tablet Take 10 mg by mouth at bedtime.     . pantoprazole (PROTONIX) 40 MG tablet TAKE 1 TABLET (40 MG TOTAL) BY MOUTH DAILY. 90 tablet 1  . albuterol (PROVENTIL HFA;VENTOLIN HFA) 108 (90 Base) MCG/ACT inhaler Inhale two puffs every 4-6 hours only as needed for shortness of breath or wheezing. 1 Inhaler 1  . neomycin-polymyxin-hydrocortisone (CORTISPORIN) OTIC solution Place 4 drops 4 (four) times daily into the right ear. 10 mL 0   No current facility-administered medications for this visit.    Allergies  Allergen Reactions  . Azithromycin Nausea And Vomiting  . Clindamycin/Lincomycin Hives    Facial swelling  . Metronidazole Hives    Rash, has had fluconazole (Diflucan) w/o reaction  . Sulfa Antibiotics Hives    Health Maintenance Health Maintenance  Topic Date Due  . Hepatitis C Screening  03-09-1961  . MAMMOGRAM  08/19/2019  . PAP SMEAR  10/26/2019  . TETANUS/TDAP  10/17/2024  . COLONOSCOPY  03/19/2025  . INFLUENZA VACCINE  Completed  . HIV Screening  Completed     Exam:  BP 108/60   Pulse 79   Temp 98.3 F (36.8 C) (Oral)   Wt 184 lb (83.5 kg)   LMP 08/30/2017 (Exact Date)   BMI 32.59 kg/m  Gen: Well NAD HEENT: EOMI,  MMM  right ear is occluded with cerumen left is normal-appearing. Mastoids are nontender bilaterally. Normal posterior pharynx.  Lungs: Normal work of breathing. CTABL Heart: RRR no MRG Abd: NABS, Soft. Nondistended, Nontender Exts: Brisk capillary refill, warm and well perfused.   Following removal of cerumen the remaining ear canal is erythematous appearing   Results for orders placed or performed in visit on 09/11/17 (from the past 72 hour(s))  Urinalysis Dipstick     Status: Abnormal   Collection Time: 09/11/17 10:47 AM  Result Value Ref Range   Color, UA yellow    Clarity, UA  clear    Glucose, UA negative    Bilirubin, UA negative    Ketones, UA negative    Spec Grav, UA 1.020 1.010 - 1.025   Blood, UA trace-intact    pH, UA 6.5 5.0 - 8.0   Protein, UA negative    Urobilinogen, UA 0.2 0.2 or 1.0 E.U./dL   Nitrite, UA negative    Leukocytes, UA Negative Negative   No results found.    Assessment and Plan: 56 y.o. female with  Cerumen impaction with resulting otitis externa of the right ear. Impaction relieved with irrigation today. Which a resulting otitis externa with Cortisporin ear drops.  Dysuria: Doubtful for UTI based on point urine dipstick. Culture pending for confirmation.   Orders Placed This Encounter  Procedures  . Urine Culture  . Urinalysis Dipstick   Meds ordered this encounter  Medications  . neomycin-polymyxin-hydrocortisone (CORTISPORIN) OTIC solution    Sig: Place 4 drops 4 (four) times daily into the right ear.    Dispense:  10 mL    Refill:  0     Discussed warning signs or symptoms. Please see discharge instructions. Patient expresses understanding.

## 2017-09-11 NOTE — Patient Instructions (Signed)
Thank you for coming in today. Use the cortisprin ear drops 3-4 x daily for about 1 week or when feeling better.   Recheck as needed.     Earwax Buildup, Adult The ears produce a substance called earwax that helps keep bacteria out of the ear and protects the skin in the ear canal. Occasionally, earwax can build up in the ear and cause discomfort or hearing loss. What increases the risk? This condition is more likely to develop in people who:  Are female.  Are elderly.  Naturally produce more earwax.  Clean their ears often with cotton swabs.  Use earplugs often.  Use in-ear headphones often.  Wear hearing aids.  Have narrow ear canals.  Have earwax that is overly thick or sticky.  Have eczema.  Are dehydrated.  Have excess hair in the ear canal.  What are the signs or symptoms? Symptoms of this condition include:  Reduced or muffled hearing.  A feeling of fullness in the ear or feeling that the ear is plugged.  Fluid coming from the ear.  Ear pain.  Ear itch.  Ringing in the ear.  Coughing.  An obvious piece of earwax that can be seen inside the ear canal.  How is this diagnosed? This condition may be diagnosed based on:  Your symptoms.  Your medical history.  An ear exam. During the exam, your health care provider will look into your ear with an instrument called an otoscope.  You may have tests, including a hearing test. How is this treated? This condition may be treated by:  Using ear drops to soften the earwax.  Having the earwax removed by a health care provider. The health care provider may: ? Flush the ear with water. ? Use an instrument that has a loop on the end (curette). ? Use a suction device.  Surgery to remove the wax buildup. This may be done in severe cases.  Follow these instructions at home:  Take over-the-counter and prescription medicines only as told by your health care provider.  Do not put any objects, including  cotton swabs, into your ear. You can clean the opening of your ear canal with a washcloth or facial tissue.  Follow instructions from your health care provider about cleaning your ears. Do not over-clean your ears.  Drink enough fluid to keep your urine clear or pale yellow. This will help to thin the earwax.  Keep all follow-up visits as told by your health care provider. If earwax builds up in your ears often or if you use hearing aids, consider seeing your health care provider for routine, preventive ear cleanings. Ask your health care provider how often you should schedule your cleanings.  If you have hearing aids, clean them according to instructions from the manufacturer and your health care provider. Contact a health care provider if:  You have ear pain.  You develop a fever.  You have blood, pus, or other fluid coming from your ear.  You have hearing loss.  You have ringing in your ears that does not go away.  Your symptoms do not improve with treatment.  You feel like the room is spinning (vertigo). Summary  Earwax can build up in the ear and cause discomfort or hearing loss.  The most common symptoms of this condition include reduced or muffled hearing and a feeling of fullness in the ear or feeling that the ear is plugged.  This condition may be diagnosed based on your symptoms, your medical history,  and an ear exam.  This condition may be treated by using ear drops to soften the earwax or by having the earwax removed by a health care provider.  Do not put any objects, including cotton swabs, into your ear. You can clean the opening of your ear canal with a washcloth or facial tissue. This information is not intended to replace advice given to you by your health care provider. Make sure you discuss any questions you have with your health care provider. Document Released: 12/01/2004 Document Revised: 01/04/2017 Document Reviewed: 01/04/2017 Elsevier Interactive Patient  Education  [employer].

## 2017-09-13 LAB — URINE CULTURE
MICRO NUMBER:: 81240249
SPECIMEN QUALITY:: ADEQUATE

## 2017-09-20 ENCOUNTER — Encounter: Payer: Self-pay | Admitting: Physician Assistant

## 2017-09-20 ENCOUNTER — Ambulatory Visit (INDEPENDENT_AMBULATORY_CARE_PROVIDER_SITE_OTHER): Payer: Medicare Other | Admitting: Physician Assistant

## 2017-09-20 VITALS — BP 138/86 | HR 91 | Ht 62.99 in | Wt 179.0 lb

## 2017-09-20 DIAGNOSIS — E781 Pure hyperglyceridemia: Secondary | ICD-10-CM

## 2017-09-20 DIAGNOSIS — I10 Essential (primary) hypertension: Secondary | ICD-10-CM

## 2017-09-20 DIAGNOSIS — E7800 Pure hypercholesterolemia, unspecified: Secondary | ICD-10-CM | POA: Diagnosis not present

## 2017-09-20 NOTE — Patient Instructions (Addendum)
6 months recheck cholesterol

## 2017-09-20 NOTE — Progress Notes (Signed)
   Subjective:    Patient ID: Janet Richards, female    DOB: February 11, 1961, 56 y.o.   MRN: [id number]  HPI Pt is a 56 yo female who presents to the clinic with labs from her insurance company that she would like to discuss. She brings in her phone in the room with the numbers.   TG 253, Total cholesterol 238. Pt does not have DM. She is a former smoker.   .. Active Ambulatory Problems    Diagnosis Date Noted  . Primary osteoarthritis of both knees 12/27/2012  . Exertional chest pain 02/05/2013  . Abnormal uterine bleeding 02/05/2013  . Essential hypertension, benign 03/01/2013  . No blood products 03/28/2013  . 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 Osteoarthritis of first metatarsophalangeal joint 04/11/2014  . IFG (impaired fasting glucose) 10/17/2014  . Chronic allergic rhinitis 01/14/2015  . Hyperlipidemia 02/20/2015  . History of colonic polyps 03/20/2015  . Paresthesias 07/01/2015  . Right ankle pain 02/24/2016  . Fatigue 02/24/2016  . Anemia, iron deficiency 02/25/2016  . Chronic dental infection 04/11/2016  . Ganglion cyst of right foot 07/12/2016  . Burning sensation of foot 09/02/2016  . Abnormal ankle brachial index (ABI) 11/09/2016  . Vaginal dryness, menopausal 03/01/2017  . Neuropathy 03/01/2017   Resolved Ambulatory Problems    Diagnosis Date Noted  . Allergic urticaria 12/27/2012  . Dental abscess 02/05/2013  . Menstrual migraine 02/05/2013  . Right lumbar radiculitis 04/19/2013  . Hyperglycemia 08/05/2013  . Allergic rhinitis 01/22/2014  . Left knee pain 02/25/2014  . Dental infection 03/20/2014  . Allergic sinusitis 01/14/2015  . Pain, dental 01/14/2015  . Sinusitis 06/26/2015  . Chest pain 07/08/2015  . Right knee injury 07/15/2015  . Neck pain 09/01/2015  . UTI (urinary tract infection) 11/25/2015  . Acute upper respiratory infection 12/22/2015  . Low blood potassium 05/24/2016  . Elevated lipase 05/24/2016  . Insect bite 05/25/2016   Past  Medical History:  Diagnosis Date  . Essential hypertension, benign   . Menstrual migraine   . Periodontitis   . Primary osteoarthritis of both knees   . Urticaria         Review of Systems  All other systems reviewed and are negative.      Objective:   Physical Exam  Constitutional: She is oriented to person, place, and time. She appears well-developed and well-nourished.  HENT:  Head: Normocephalic and atraumatic.  Cardiovascular: Normal rate, regular rhythm and normal heart sounds.  Pulmonary/Chest: Effort normal and breath sounds normal.  Neurological: She is alert and oriented to person, place, and time.  Psychiatric: She has a normal mood and affect. Her behavior is normal.          Assessment & Plan:  Marland KitchenMarland KitchenCrislyn was seen today for labs review.  Diagnoses and all orders for this visit:  Pure hypercholesterolemia  Essential hypertension, benign  Hypertriglyceridemia   BP controlled today.  TG elevated discussed low sugar/carb diet and regular exercise with addition of fish oil 4000mg  daily.  We do not have break down of total cholesterol to know what the good cholesterol is. Total is over 200. She is over 50. She is a former smoker. She is not DM. Discussed could help decrease overall risk to start a statin. Pt does not want to start a statin. She would at least try lifestyle changes for 6 months and then recheck labs and see if she still needs a statin. I did encouraged ASA 81mg  daily.  [name]

## 2017-09-21 DIAGNOSIS — E781 Pure hyperglyceridemia: Secondary | ICD-10-CM | POA: Insufficient documentation

## 2017-10-02 ENCOUNTER — Other Ambulatory Visit: Payer: Self-pay | Admitting: Family Medicine

## 2017-10-03 ENCOUNTER — Other Ambulatory Visit: Payer: Self-pay | Admitting: Family Medicine

## 2017-10-03 ENCOUNTER — Telehealth: Payer: Self-pay

## 2017-10-03 DIAGNOSIS — J4520 Mild intermittent asthma, uncomplicated: Secondary | ICD-10-CM

## 2017-10-03 NOTE — Telephone Encounter (Signed)
PT would like to change PCP's from [facility] to Dr. Georgina Snell. Did not state a reason why.

## 2017-10-03 NOTE — Telephone Encounter (Signed)
Error

## 2017-10-12 ENCOUNTER — Encounter: Payer: Self-pay | Admitting: Family Medicine

## 2017-10-12 ENCOUNTER — Ambulatory Visit (INDEPENDENT_AMBULATORY_CARE_PROVIDER_SITE_OTHER): Payer: Medicare Other | Admitting: Family Medicine

## 2017-10-12 VITALS — BP 142/85 | HR 87 | Wt 180.0 lb

## 2017-10-12 DIAGNOSIS — K047 Periapical abscess without sinus: Secondary | ICD-10-CM | POA: Diagnosis not present

## 2017-10-12 MED ORDER — CIPROFLOXACIN HCL 500 MG PO TABS: 500.0000 mg | ORAL_TABLET | Freq: Two times a day (BID) | ORAL | 1 refills | Status: DC

## 2017-10-12 NOTE — Progress Notes (Signed)
Janet Richards is a 56 y.o. female who presents to [facility]: [facility] today for pain right lower jaw.  Patient had a tooth extracted a few days ago.  She notes pain and redness at the site.  No fevers or chills.  She is concerned she may have an infection.   Past Medical History:  Diagnosis Date  . Essential hypertension, benign   . Menstrual migraine   . Periodontitis   . Primary osteoarthritis of both knees   . Urticaria    Past Surgical History:  Procedure Laterality Date  . TUBAL LIGATION  12/26/1987   Social History   Tobacco Use  . Smoking status: Former Smoker    Packs/day: 1.50    Types: Cigarettes    Last attempt to quit: 02/05/2006    Years since quitting: 11.6  . Smokeless tobacco: Never Used  Substance Use Topics  . Alcohol use: Yes    Comment: rarely   family history includes CAD in her father; Hypertension in her mother.  ROS as above:  Medications: Current Outpatient Medications  Medication Sig Dispense Refill  . albuterol (PROAIR HFA) 108 (90 Base) MCG/ACT inhaler INHALE TWO PUFFS EVERY 4-6 HOURS ONLY AS NEEDED FOR SHORTNESS OF BREATH OR WHEEZING. 8.5 Inhaler 0  . cetirizine HCl (ZYRTEC) 1 MG/ML solution TAKE 10 MLS (10 MG TOTAL) BY MOUTH DAILY. 944 mL 4  . conjugated estrogens (PREMARIN) vaginal cream Place 1 Applicatorful vaginally daily. For first 2 weeks then decrease to 3 times a week. 42.5 g 12  . hydrochlorothiazide (HYDRODIURIL) 25 MG tablet TAKE 1 TABLET (25 MG TOTAL) BY MOUTH DAILY. 90 tablet 0  . montelukast (SINGULAIR) 10 MG tablet Take 10 mg by mouth at bedtime.     [name] neomycin-polymyxin-hydrocortisone (CORTISPORIN) OTIC solution Place 4 drops 4 (four) times daily into the right ear. 10 mL 0  . pantoprazole (PROTONIX) 40 MG tablet TAKE 1 TABLET (40 MG TOTAL) BY MOUTH DAILY. 90 tablet 1  . albuterol (PROVENTIL HFA;VENTOLIN HFA) 108  (90 Base) MCG/ACT inhaler Inhale two puffs every 4-6 hours only as needed for shortness of breath or wheezing. 1 Inhaler 1  . ciprofloxacin (CIPRO) 500 MG tablet Take 1 tablet (500 mg total) by mouth 2 (two) times daily. 14 tablet 1   No current facility-administered medications for this visit.    Allergies  Allergen Reactions  . Azithromycin Nausea And Vomiting  . Clindamycin/Lincomycin Hives    Facial swelling  . Metronidazole Hives    Rash, has had fluconazole (Diflucan) w/o reaction  . Sulfa Antibiotics Hives    Health Maintenance Health Maintenance  Topic Date Due  . Hepatitis C Screening  [birth year]  . MAMMOGRAM  08/19/2019  . PAP SMEAR  10/26/2019  . TETANUS/TDAP  10/17/2024  . COLONOSCOPY  03/19/2025  . INFLUENZA VACCINE  Completed  . HIV Screening  Completed     Exam:  BP (!) 142/85   Pulse 87   Wt 180 lb (81.6 kg)   BMI 31.89 kg/m  Gen: Well NAD HEENT: EOMI,  MMM multiple missing teeth with poor dentition.  Socket right lower tooth is erythematous with whitish material.  Mildly tender surrounding.  No abscess formation. Lungs: Normal work of breathing. CTABL Heart: RRR no MRG Abd: NABS, Soft. Nondistended, Nontender Exts: Brisk capillary refill, warm and well perfused.    No results found for this or any previous visit (from the past 72 hour(s)). No results  found.    Assessment and Plan: 56 y.o. female with possible dental infection following tooth extraction.  Treatment with Cipro as this is worked well in the past for her and she has multiple drug allergies.  Recheck as needed.   No orders of the defined types were placed in this encounter.  Meds ordered this encounter  Medications  . ciprofloxacin (CIPRO) 500 MG tablet    Sig: Take 1 tablet (500 mg total) by mouth 2 (two) times daily.    Dispense:  14 tablet    Refill:  1     Discussed warning signs or symptoms. Please see discharge instructions. Patient expresses understanding.

## 2017-10-12 NOTE — Patient Instructions (Signed)
Thank you for coming in today. Take cipro for infection for 1 week.  I gave you a refill as well.  Continue HCTZ.

## 2017-10-15 ENCOUNTER — Other Ambulatory Visit: Payer: Self-pay | Admitting: Physician Assistant

## 2017-10-25 ENCOUNTER — Ambulatory Visit: Payer: Self-pay | Admitting: Obstetrics & Gynecology

## 2017-10-27 ENCOUNTER — Encounter: Payer: Self-pay | Admitting: Physician Assistant

## 2017-10-27 ENCOUNTER — Ambulatory Visit (INDEPENDENT_AMBULATORY_CARE_PROVIDER_SITE_OTHER): Payer: Medicare Other | Admitting: Physician Assistant

## 2017-10-27 VITALS — BP 130/67 | HR 99 | Ht 63.0 in | Wt 180.0 lb

## 2017-10-27 DIAGNOSIS — R319 Hematuria, unspecified: Secondary | ICD-10-CM | POA: Diagnosis not present

## 2017-10-27 DIAGNOSIS — J0101 Acute recurrent maxillary sinusitis: Secondary | ICD-10-CM | POA: Diagnosis not present

## 2017-10-27 DIAGNOSIS — B379 Candidiasis, unspecified: Secondary | ICD-10-CM

## 2017-10-27 DIAGNOSIS — R300 Dysuria: Secondary | ICD-10-CM

## 2017-10-27 LAB — POCT URINALYSIS DIPSTICK
Bilirubin, UA: NEGATIVE
Glucose, UA: NEGATIVE
Ketones, UA: NEGATIVE
Leukocytes, UA: NEGATIVE
Nitrite, UA: NEGATIVE
Protein, UA: NEGATIVE
Spec Grav, UA: <=1.005 — AB (ref 1.010–1.025)
Urobilinogen, UA: 0.2 E.U./dL
pH, UA: 6 (ref 5.0–8.0)

## 2017-10-27 MED ORDER — AMOXICILLIN-POT CLAVULANATE 875-125 MG PO TABS: 1.0000 | ORAL_TABLET | Freq: Two times a day (BID) | ORAL | 0 refills | Status: DC

## 2017-10-27 MED ORDER — FLUCONAZOLE 150 MG PO TABS: 150.0000 mg | ORAL_TABLET | Freq: Once | ORAL | 0 refills | Status: AC

## 2017-10-27 MED ORDER — METHYLPREDNISOLONE 4 MG PO TBPK: ORAL_TABLET | ORAL | 0 refills | Status: DC

## 2017-10-27 NOTE — Progress Notes (Signed)
Subjective:    Patient ID: Janet Richards, female    DOB: [birth year], 56 y.o.   MRN: [id number]  HPI Pt is a 56 yo female who presents to the clinic with 2 weeks of sinus pressure, nasal congestion, headache, ear popping. Her son has been sick as well. She has tried sudafed with some relief. No fever, chills, body aches.   .. Active Ambulatory Problems    Diagnosis Date Noted  . Primary osteoarthritis of both knees 12/27/2012  . Exertional chest pain 02/05/2013  . Abnormal uterine bleeding 02/05/2013  . Essential hypertension, benign 03/01/2013  . No blood products 03/28/2013  . 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 Osteoarthritis of first metatarsophalangeal joint 04/11/2014  . IFG (impaired fasting glucose) 10/17/2014  . Chronic allergic rhinitis 01/14/2015  . Hyperlipidemia 02/20/2015  . History of colonic polyps 03/20/2015  . Paresthesias 07/01/2015  . Right ankle pain 02/24/2016  . Fatigue 02/24/2016  . Anemia, iron deficiency 02/25/2016  . Chronic dental infection 04/11/2016  . Ganglion cyst of right foot 07/12/2016  . Burning sensation of foot 09/02/2016  . Abnormal ankle brachial index (ABI) 11/09/2016  . Vaginal dryness, menopausal 03/01/2017  . Neuropathy 03/01/2017  . Hypertriglyceridemia 09/21/2017   Resolved Ambulatory Problems    Diagnosis Date Noted  . Allergic urticaria 12/27/2012  . Dental abscess 02/05/2013  . Menstrual migraine 02/05/2013  . Right lumbar radiculitis 04/19/2013  . Hyperglycemia 08/05/2013  . Allergic rhinitis 01/22/2014  . Left knee pain 02/25/2014  . Dental infection 03/20/2014  . Allergic sinusitis 01/14/2015  . Pain, dental 01/14/2015  . Sinusitis 06/26/2015  . Chest pain 07/08/2015  . Right knee injury 07/15/2015  . Neck pain 09/01/2015  . UTI (urinary tract infection) 11/25/2015  . Acute upper respiratory infection 12/22/2015  . Low blood potassium 05/24/2016  . Elevated lipase 05/24/2016  . Insect bite 05/25/2016   Past  Medical History:  Diagnosis Date  . Essential hypertension, benign   . Menstrual migraine   . Periodontitis   . Primary osteoarthritis of both knees   . Urticaria       Review of Systems  All other systems reviewed and are negative.      Objective:   Physical Exam  Constitutional: She is oriented to person, place, and time. She appears well-developed and well-nourished.  HENT:  Head: Normocephalic and atraumatic.  Cardiovascular: Normal rate, regular rhythm and normal heart sounds.  Pulmonary/Chest: Effort normal and breath sounds normal.  Neurological: She is alert and oriented to person, place, and time.  Psychiatric: She has a normal mood and affect. Her behavior is normal.          Assessment & Plan:  Marland KitchenMarland KitchenAmorita was seen today for headache and sinus pressure.  Diagnoses and all orders for this visit:  Acute recurrent maxillary sinusitis -     amoxicillin-clavulanate (AUGMENTIN) 875-125 MG tablet; Take 1 tablet by mouth 2 (two) times daily. Take 1 tab twice a day for 10 days. -     methylPREDNISolone (MEDROL DOSEPAK) 4 MG TBPK tablet; Take as directed by package insert.  Dysuria -     Urine Culture -     POCT urinalysis dipstick  Hematuria, unspecified type -     Urine Culture  Yeast infection -     fluconazole (DIFLUCAN) 150 MG tablet; Take 1 tablet (150 mg total) by mouth once for 1 dose. Repeat in 48-72 hours if symptoms persist.  .. Results for orders placed or performed in visit on  10/27/17  POCT urinalysis dipstick  Result Value Ref Range   Color, UA light yellow    Clarity, UA clear    Glucose, UA neg    Bilirubin, UA neg    Ketones, UA neg    Spec Grav, UA <=1.005 (A) 1.010 - 1.025   Blood, UA trace-lysed    pH, UA 6.0 5.0 - 8.0   Protein, UA neg    Urobilinogen, UA 0.2 0.2 or 1.0 E.U./dL   Nitrite, UA neg    Leukocytes, UA Negative Negative   Appearance     Odor      Sent abx. HO given for symptomatic care. Medrol dose to start if not  improving in ext few days since holiday weekend here. Diflucan for yeast due to abx usage.   Will culture urine. Discussed does not appear to have infection. If symptoms persist please follow up.

## 2017-10-27 NOTE — Patient Instructions (Signed)

## 2017-10-28 LAB — URINE CULTURE
MICRO NUMBER:: 81439921
SPECIMEN QUALITY:: ADEQUATE

## 2017-11-01 ENCOUNTER — Ambulatory Visit: Payer: Self-pay | Admitting: Obstetrics & Gynecology

## 2017-11-01 DIAGNOSIS — Z01419 Encounter for gynecological examination (general) (routine) without abnormal findings: Secondary | ICD-10-CM

## 2017-11-07 ENCOUNTER — Other Ambulatory Visit: Payer: Self-pay

## 2017-11-07 ENCOUNTER — Emergency Department (INDEPENDENT_AMBULATORY_CARE_PROVIDER_SITE_OTHER)
Admission: EM | Admit: 2017-11-07 | Discharge: 2017-11-07 | Disposition: A | Discharge status: Home / Self Care | Payer: Medicare Other | Source: Home / Self Care | Attending: Emergency Medicine | Admitting: Emergency Medicine

## 2017-11-07 DIAGNOSIS — R1033 Periumbilical pain: Secondary | ICD-10-CM | POA: Diagnosis not present

## 2017-11-07 DIAGNOSIS — R197 Diarrhea, unspecified: Secondary | ICD-10-CM | POA: Diagnosis not present

## 2017-11-07 LAB — POCT URINALYSIS DIP (MANUAL ENTRY)
Bilirubin, UA: NEGATIVE
Blood, UA: NEGATIVE
Glucose, UA: NEGATIVE mg/dL
Ketones, POC UA: NEGATIVE mg/dL
Leukocytes, UA: NEGATIVE
Nitrite, UA: NEGATIVE
Protein Ur, POC: NEGATIVE mg/dL
Spec Grav, UA: 1.015 (ref 1.010–1.025)
Urobilinogen, UA: 0.2 E.U./dL
pH, UA: 6 (ref 5.0–8.0)

## 2017-11-07 MED ORDER — LOPERAMIDE HCL 2 MG PO TABS: 2.0000 mg | ORAL_TABLET | Freq: Three times a day (TID) | ORAL | 0 refills | Status: DC | PRN

## 2017-11-07 NOTE — ED Provider Notes (Addendum)
Janet Richards CARE    CSN: [id number] Arrival date & time: 11/07/17  1446     History   Chief Complaint Chief Complaint  Patient presents with  . Abdominal Pain    in [place]    HPI Janet Richards is a 57 y.o. female.  Started as mild epigastric pain, 2 days ago, progressed to area umbilical pain yesterday. Has not noted any of abdominal hernia. Pain does not increase with coughing or movement or change of position. She admits she has been drinking apple vinegar daily to lose weight, and that might E upsetting her stomach she also feels that Augmentin may have been causing the diarrhea(nonbloody, no mucus, not foul smelling) and episodic abdominal pain. Denies fever or chills or nausea or vomiting.  Abdominal Pain  Pain location:  Periumbilical Pain quality: stabbing   Pain radiates to:  Does not radiate Pain severity now: Intermittent, varies between 1 and 7, lasts for a minute and resolves. Onset quality:  Unable to specify Duration:  2 days Timing:  Intermittent Progression:  Waxing and waning Chronicity:  New Context: not recent travel, not sick contacts and not trauma   Context comment:  Finished Augmentin a few days ago, prescribed by PCP for URI Associated symptoms: diarrhea (8 small volume, loose watery stools a day for 2 days)   Associated symptoms: no belching, no chest pain, no chills, no constipation, no cough, no dysuria, no fever, no hematemesis, no hematochezia, no hematuria, no melena, no nausea, no shortness of breath, no sore throat, no vaginal bleeding and no vomiting   Associated symptoms comment:  Denies pelvic pain or right or left lower quadrant pain   Past Medical History:  Diagnosis Date  . Essential hypertension, benign   . Menstrual migraine   . Periodontitis   . Primary osteoarthritis of both knees   . Urticaria     Patient Active Problem List   Diagnosis Date Noted  . Hypertriglyceridemia 09/21/2017  . Vaginal dryness, menopausal  03/01/2017  . Neuropathy 03/01/2017  . Abnormal ankle brachial index (ABI) 11/09/2016  . Burning sensation of foot 09/02/2016  . Ganglion cyst of right foot 07/12/2016  . Chronic dental infection 04/11/2016  . Anemia, iron deficiency 02/25/2016  . Right ankle pain 02/24/2016  . Fatigue 02/24/2016  . Paresthesias 07/01/2015  . History of colonic polyps 03/20/2015  . Hyperlipidemia 02/20/2015  . Chronic allergic rhinitis 01/14/2015  . IFG (impaired fasting glucose) 10/17/2014  . Osteoarthritis of first metatarsophalangeal joint 04/11/2014  . 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 No blood products 03/28/2013  . Essential hypertension, benign 03/01/2013  . Exertional chest pain 02/05/2013  . Abnormal uterine bleeding 02/05/2013  . Primary osteoarthritis of both knees 12/27/2012    Past Surgical History:  Procedure Laterality Date  . TUBAL LIGATION  12/26/1987    OB History    Gravida Para Term Preterm AB Living   5 3 3   2 3    SAB TAB Ectopic Multiple Live Births     2             Home Medications    Prior to Admission medications   Medication Sig Start Date End Date Taking? Authorizing Provider  albuterol (PROAIR HFA) 108 (90 Base) MCG/ACT inhaler INHALE TWO PUFFS EVERY 4-6 HOURS ONLY AS NEEDED FOR SHORTNESS OF BREATH OR WHEEZING. 10/03/17   Breeback, Jade L, PA-C  albuterol (PROVENTIL HFA;VENTOLIN HFA) 108 (90 Base) MCG/ACT inhaler Inhale two puffs every 4-6 hours only as needed  for shortness of breath or wheezing. 01/19/16 01/18/17  [name], MD  amoxicillin-clavulanate (AUGMENTIN) 875-125 MG tablet Take 1 tablet by mouth 2 (two) times daily. Take 1 tab twice a day for 10 days. 10/27/17   Breeback, Jade L, PA-C  cetirizine HCl (ZYRTEC) 1 MG/ML solution TAKE 10 MLS (10 MG TOTAL) BY MOUTH DAILY. 03/20/17   Breeback, Luvenia Starch L, PA-C  conjugated estrogens (PREMARIN) vaginal cream Place 1 Applicatorful vaginally daily. For first 2 weeks then decrease to 3 times a week. 03/01/17    Breeback, Jade L, PA-C  hydrochlorothiazide (HYDRODIURIL) 25 MG tablet TAKE 1 TABLET (25 MG TOTAL) BY MOUTH DAILY. 10/02/17   Breeback, Luvenia Starch L, PA-C  loperamide (IMODIUM A-D) 2 MG tablet Take 1 tablet (2 mg total) by mouth 3 (three) times daily as needed for diarrhea or loose stools. 11/07/17   [name], MD  methylPREDNISolone (MEDROL DOSEPAK) 4 MG TBPK tablet Take as directed by package insert. 10/27/17   Breeback, Jade L, PA-C  montelukast (SINGULAIR) 10 MG tablet Take 10 mg by mouth at bedtime.  04/04/13   [provider]  neomycin-polymyxin-hydrocortisone (CORTISPORIN) OTIC solution Place 4 drops 4 (four) times daily into the right ear. 09/11/17   [name], MD  pantoprazole (PROTONIX) 40 MG tablet TAKE 1 TABLET (40 MG TOTAL) BY MOUTH DAILY. 10/17/17   [name], PA-C    Family History Family History  Problem Relation Age of Onset  . Hypertension Mother   . CAD Father        MI in his 49s    Social History Social History   Tobacco Use  . Smoking status: Former Smoker    Packs/day: 1.50    Types: Cigarettes    Last attempt to quit: 02/05/2006    Years since quitting: 11.7  . Smokeless tobacco: Never Used  Substance Use Topics  . Alcohol use: Yes    Comment: rarely  . Drug use: No     Allergies   Azithromycin; Clindamycin/lincomycin; Metronidazole; and Sulfa antibiotics   Review of Systems Review of Systems  Constitutional: Negative for chills and fever.  HENT: Negative for sore throat.   Respiratory: Negative for cough and shortness of breath.   Cardiovascular: Negative for chest pain.  Gastrointestinal: Positive for abdominal pain and diarrhea (8 small volume, loose watery stools a day for 2 days). Negative for constipation, hematemesis, hematochezia, melena, nausea and vomiting.  Genitourinary: Negative for dysuria, hematuria and vaginal bleeding.  All other systems reviewed and are negative.    Physical Exam Triage Vital Signs ED Triage  Vitals  Enc Vitals Group     BP      Pulse      Resp      Temp      Temp src      SpO2      Weight      Height      Head Circumference      Peak Flow      Pain Score      Pain Loc      Pain Edu?      Excl. in [place]?    No data found.  Updated Vital Signs BP 108/71 (BP Location: Right Arm)   Pulse 94   Temp 98.2 F (36.8 C) (Oral)   Ht 5\' 3"  (1.6 m)   Wt 182 lb (82.6 kg)   LMP 08/30/2017   SpO2 99%   BMI 32.24 kg/m   Visual Acuity Right  Eye Distance:   Left Eye Distance:   Bilateral Distance:    Right Eye Near:   Left Eye Near:    Bilateral Near:     Physical Exam  Constitutional: She is oriented to person, place, and time. She appears well-developed and well-nourished.  Non-toxic appearance. No distress.  Pleasant female, talkative, no acute distress.   HENT:  Head: Normocephalic and atraumatic.  Nose: Nose normal.  Mouth/Throat: Oropharynx is clear and moist.  Oropharynx clear. Some moisture on mucous membranes. No lesions.  Eyes: Pupils are equal, round, and reactive to light. No scleral icterus.  Neck: Normal range of motion. Neck supple. No JVD present.  Cardiovascular: Normal rate, regular rhythm and normal heart sounds.  No murmur heard. Pulmonary/Chest: Effort normal and breath sounds normal.  Abdominal: Soft. She exhibits no distension, no abdominal bruit and no mass. Bowel sounds are increased. There is no hepatosplenomegaly. There is no rigidity, no rebound, no guarding, no CVA tenderness, no tenderness at McBurney's point and negative Murphy's sign. No hernia.  She points to her umbilicus, which is minimally tender to palpation, but no mass or hernia. Minimal, nonreproducible epigastric tenderness. Otherwise, abdominal exam is negative without tenderness or mass. Bowel sounds slightly hyperactive 4. No lower abdominal or suprapubic tenderness.  Musculoskeletal: Normal range of motion. She exhibits no edema or tenderness.  Lymphadenopathy:    She has  no cervical adenopathy.  Neurological: She is alert and oriented to person, place, and time. No cranial nerve deficit.  Skin: Skin is warm and dry. No rash noted.  Psychiatric: She has a normal mood and affect.  Nursing note and vitals reviewed.    UC Treatments / Results  Labs (all labs ordered are listed, but only abnormal results are displayed) Labs Reviewed  POCT URINALYSIS DIP (MANUAL ENTRY)    EKG  EKG Interpretation None       Radiology No results found.  Procedures Procedures (including critical care time)  Medications Ordered in UC Medications - No data to display   Initial Impression / Assessment and Plan / UC Course  I have reviewed the triage vital signs and the nursing notes.  Pertinent labs & imaging results that were available during my care of the patient were reviewed by me and considered in my medical decision making (see chart for details).     Urinalysis today normal.  Final Clinical Impressions(s) / UC Diagnoses  Mild , intermittent periUmbilical and epigastric pain, associated with loose watery stools for 2 days, with benign abdominal exam. Most likely related to side effects of Augmentin, although no signs or symptoms of any severe disease or C. Difficile. We discussed options, we both agree that no further workup indicated. No evidence of surgical abdomen. Advised that since she has already completed course of Augmentin, we treat with symptomatic care. Imodium when necessary diarrhea, but precautions discussed. OTC probiotics. Continue pantoprazole for her history of reflux. [place] food and other precautions. Instructions to follow-up if any persistent symptoms or red flags. She voiced understanding. Follow-up with PCP.    Final diagnoses:  Periumbilical abdominal pain  Diarrhea, unspecified type    ED Discharge Orders        Ordered    loperamide (IMODIUM A-D) 2 MG tablet  3 times daily PRN     11/07/17 1526       Controlled  Substance Prescriptions [place] Controlled Substance Registry consulted? Not Applicable   [name], MD 11/07/17 1539    [name], MD 11/07/17 [phone]

## 2017-11-07 NOTE — ED Triage Notes (Signed)
Pt stated that she has had pain in her naval and above today.  At times it has been as high as a 7, and it is a stabbing pain.

## 2017-11-10 ENCOUNTER — Emergency Department (INDEPENDENT_AMBULATORY_CARE_PROVIDER_SITE_OTHER): Payer: Medicare Other

## 2017-11-10 ENCOUNTER — Other Ambulatory Visit: Payer: Self-pay

## 2017-11-10 ENCOUNTER — Emergency Department (INDEPENDENT_AMBULATORY_CARE_PROVIDER_SITE_OTHER)
Admission: EM | Admit: 2017-11-10 | Discharge: 2017-11-10 | Disposition: A | Discharge status: Home / Self Care | Payer: Medicare Other | Source: Home / Self Care | Attending: Family Medicine | Admitting: Family Medicine

## 2017-11-10 DIAGNOSIS — J3089 Other allergic rhinitis: Secondary | ICD-10-CM

## 2017-11-10 DIAGNOSIS — G44209 Tension-type headache, unspecified, not intractable: Secondary | ICD-10-CM

## 2017-11-10 MED ORDER — KETOROLAC TROMETHAMINE 60 MG/2ML IM SOLN
60.0000 mg | Freq: Once | INTRAMUSCULAR | Status: AC
  Administered 2017-11-10: 60 mg via INTRAMUSCULAR

## 2017-11-10 NOTE — ED Provider Notes (Signed)
Janet Richards CARE    CSN: [id number] Arrival date & time: 11/10/17  1441     History   Chief Complaint Chief Complaint  Patient presents with  . Headache    HPI Janet Richards is a 57 y.o. female.   Patient complains of a "sinus headache" for 2 days, with pain in her forehead and cheeks.  She has developed increased post-nasal drainage, sinus congestion, chills, myalgias, and fatigue.  No cough or sore throat.   The history is provided by the patient.    Past Medical History:  Diagnosis Date  . Essential hypertension, benign   . Menstrual migraine   . Periodontitis   . Primary osteoarthritis of both knees   . Urticaria     Patient Active Problem List   Diagnosis Date Noted  . Hypertriglyceridemia 09/21/2017  . Vaginal dryness, menopausal 03/01/2017  . Neuropathy 03/01/2017  . Abnormal ankle brachial index (ABI) 11/09/2016  . Burning sensation of foot 09/02/2016  . Ganglion cyst of right foot 07/12/2016  . Chronic dental infection 04/11/2016  . Anemia, iron deficiency 02/25/2016  . Right ankle pain 02/24/2016  . Fatigue 02/24/2016  . Paresthesias 07/01/2015  . History of colonic polyps 03/20/2015  . Hyperlipidemia 02/20/2015  . Chronic allergic rhinitis 01/14/2015  . IFG (impaired fasting glucose) 10/17/2014  . Osteoarthritis of first metatarsophalangeal joint 04/11/2014  . 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 No blood products 03/28/2013  . Essential hypertension, benign 03/01/2013  . Exertional chest pain 02/05/2013  . Abnormal uterine bleeding 02/05/2013  . Primary osteoarthritis of both knees 12/27/2012    Past Surgical History:  Procedure Laterality Date  . TUBAL LIGATION  12/26/1987    OB History    Gravida Para Term Preterm AB Living   5 3 3   2 3    SAB TAB Ectopic Multiple Live Births     2             Home Medications    Prior to Admission medications   Medication Sig Start Date End Date Taking? Authorizing Provider    albuterol (PROAIR HFA) 108 (90 Base) MCG/ACT inhaler INHALE TWO PUFFS EVERY 4-6 HOURS ONLY AS NEEDED FOR SHORTNESS OF BREATH OR WHEEZING. 10/03/17   Breeback, Jade L, PA-C  albuterol (PROVENTIL HFA;VENTOLIN HFA) 108 (90 Base) MCG/ACT inhaler Inhale two puffs every 4-6 hours only as needed for shortness of breath or wheezing. 01/19/16 01/18/17  [name], MD  amoxicillin-clavulanate (AUGMENTIN) 875-125 MG tablet Take 1 tablet by mouth 2 (two) times daily. Take 1 tab twice a day for 10 days. 10/27/17   Breeback, Jade L, PA-C  cetirizine HCl (ZYRTEC) 1 MG/ML solution TAKE 10 MLS (10 MG TOTAL) BY MOUTH DAILY. 03/20/17   Breeback, Luvenia Starch L, PA-C  conjugated estrogens (PREMARIN) vaginal cream Place 1 Applicatorful vaginally daily. For first 2 weeks then decrease to 3 times a week. 03/01/17   Breeback, Jade L, PA-C  hydrochlorothiazide (HYDRODIURIL) 25 MG tablet TAKE 1 TABLET (25 MG TOTAL) BY MOUTH DAILY. 10/02/17   Breeback, Luvenia Starch L, PA-C  loperamide (IMODIUM A-D) 2 MG tablet Take 1 tablet (2 mg total) by mouth 3 (three) times daily as needed for diarrhea or loose stools. 11/07/17   [name], MD  methylPREDNISolone (MEDROL DOSEPAK) 4 MG TBPK tablet Take as directed by package insert. 10/27/17   Breeback, Jade L, PA-C  montelukast (SINGULAIR) 10 MG tablet Take 10 mg by mouth at bedtime.  04/04/13   [provider]  neomycin-polymyxin-hydrocortisone (  CORTISPORIN) OTIC solution Place 4 drops 4 (four) times daily into the right ear. 09/11/17   [name], MD  pantoprazole (PROTONIX) 40 MG tablet TAKE 1 TABLET (40 MG TOTAL) BY MOUTH DAILY. 10/17/17   [name], PA-C    Family History Family History  Problem Relation Age of Onset  . Hypertension Mother   . CAD Father        MI in his 73s    Social History Social History   Tobacco Use  . Smoking status: Former Smoker    Packs/day: 1.50    Types: Cigarettes    Last attempt to quit: 02/05/2006    Years since quitting: 11.7  .  Smokeless tobacco: Never Used  Substance Use Topics  . Alcohol use: Yes    Comment: rarely  . Drug use: No     Allergies   Azithromycin; Clindamycin/lincomycin; Metronidazole; and Sulfa antibiotics   Review of Systems Review of Systems No sore throat No cough No pleuritic pain No wheezing + nasal congestion + post-nasal drainage + sinus pain/pressure No itchy/red eyes No earache No hemoptysis No SOB No fever, + chills No nausea No vomiting No abdominal pain No diarrhea No urinary symptoms No skin rash + fatigue + myalgias + headache Used OTC meds without relief   Physical Exam Triage Vital Signs ED Triage Vitals  Enc Vitals Group     BP 11/10/17 1519 118/74     Pulse Rate 11/10/17 1519 84     Resp --      Temp 11/10/17 1519 98.6 F (37 C)     Temp Source 11/10/17 1519 Oral     SpO2 11/10/17 1519 98 %     Weight 11/10/17 1520 184 lb (83.5 kg)     Height 11/10/17 1520 5\' 3"  (1.6 m)     Head Circumference --      Peak Flow --      Pain Score 11/10/17 1520 8     Pain Loc --      Pain Edu? --      Excl. in [place]? --    No data found.  Updated Vital Signs BP 118/74 (BP Location: Right Arm)   Pulse 84   Temp 98.6 F (37 C) (Oral)   Ht 5\' 3"  (1.6 m)   Wt 184 lb (83.5 kg)   SpO2 98%   BMI 32.59 kg/m   Visual Acuity Right Eye Distance:   Left Eye Distance:   Bilateral Distance:    Right Eye Near:   Left Eye Near:    Bilateral Near:     Physical Exam Nursing notes and Vital Signs reviewed. Appearance:  Patient appears stated age, and in no acute distress Eyes:  Pupils are equal, round, and reactive to light and accomodation.  Extraocular movement is intact.  Conjunctivae are not inflamed  Ears:  Canals normal.  Tympanic membranes normal.  Nose:  Mildly congested turbinates.  Maxillary and frontal sinus tenderness is present.  Pharynx:  Normal Neck:  Supple.  Enlarged posterior/lateral nodes are palpated bilaterally, tender to palpation on the  left.   Lungs:  Clear to auscultation.  Breath sounds are equal.  Moving air well. Heart:  Regular rate and rhythm without murmurs, rubs, or gallops.  Abdomen:  Nontender without masses or hepatosplenomegaly.  Bowel sounds are present.  No CVA or flank tenderness.  Extremities:  No edema.  Skin:  No rash present.   Neurologic:  Cranial nerves 2 through 12 are  normal.  Patellar, achilles, and elbow reflexes are normal.  Cerebellar function is intact (finger-to-nose and rapid alternating hand movement).  Gait and station are normal.  UC Treatments / Results  Labs (all labs ordered are listed, but only abnormal results are displayed) Labs Reviewed - No data to display  EKG  EKG Interpretation None       Radiology Dg Sinuses Complete  Result Date: 11/10/2017 CLINICAL DATA:  Sinus pressure.  Chronic allergies. EXAM: PARANASAL SINUSES - COMPLETE 3 + VIEW COMPARISON:  None. FINDINGS: The paranasal sinus are aerated. There is no evidence of sinus opacification air-fluid levels or mucosal thickening. No significant bone abnormalities are seen. IMPRESSION: Negative. Electronically Signed   By: Franki Cabot M.D.   On: 11/10/2017 17:00    Procedures Procedures (including critical care time)  Medications Ordered in UC Medications  ketorolac (TORADOL) injection 60 mg (60 mg Intramuscular Given 11/10/17 1712)     Initial Impression / Assessment and Plan / UC Course  I have reviewed the triage vital signs and the nursing notes.  Pertinent labs & imaging results that were available during my care of the patient were reviewed by me and considered in my medical decision making (see chart for details).    Suspect beginning of early viral URI. Administered Toradol 60mg  IM May continue Toradol (ketorolac) 10mg  (patient has Rx at home) one tab every 6 hours as needed for headache, up to 5 days maximum. If cold symptoms develop, try the following: Take plain guaifenesin (1200mg  extended release  tabs such as Mucinex) twice daily, with plenty of water, for cough and congestion.  Get adequate rest.   May use Afrin nasal spray (or generic oxymetazoline) each morning for about 5 days and then discontinue.  Also recommend using saline nasal spray several times daily and saline nasal irrigation (AYR is a common brand).  Use Flonase nasal spray each morning after using Afrin nasal spray and saline nasal irrigation. Try warm salt water gargles for sore throat.  Stop all antihistamines for now, and other non-prescription cough/cold preparations.    Final Clinical Impressions(s) / UC Diagnoses   Final diagnoses:  Acute non intractable tension-type headache    ED Discharge Orders    None           Kandra Nicolas, MD 11/13/17 2126

## 2017-11-10 NOTE — ED Triage Notes (Signed)
Has had a "sinus" headache since yesterday.  Pain across forehead.

## 2017-11-10 NOTE — Discharge Instructions (Signed)
May continue Toradol (ketorolac) 10mg , one tab every 6 hours as needed for headache, up to 5 days maximum. If cold symptoms develop, try the following: Take plain guaifenesin (1200mg  extended release tabs such as Mucinex) twice daily, with plenty of water, for cough and congestion.  Get adequate rest.   May use Afrin nasal spray (or generic oxymetazoline) each morning for about 5 days and then discontinue.  Also recommend using saline nasal spray several times daily and saline nasal irrigation (AYR is a common brand).  Use Flonase nasal spray each morning after using Afrin nasal spray and saline nasal irrigation. Try warm salt water gargles for sore throat.  Stop all antihistamines for now, and other non-prescription cough/cold preparations.

## 2017-11-24 ENCOUNTER — Ambulatory Visit (INDEPENDENT_AMBULATORY_CARE_PROVIDER_SITE_OTHER): Payer: Medicare Other | Admitting: Physician Assistant

## 2017-11-24 ENCOUNTER — Encounter: Payer: Self-pay | Admitting: Physician Assistant

## 2017-11-24 VITALS — BP 150/83 | HR 97 | Ht 63.0 in | Wt 181.0 lb

## 2017-11-24 DIAGNOSIS — H6983 Other specified disorders of Eustachian tube, bilateral: Secondary | ICD-10-CM

## 2017-11-24 DIAGNOSIS — N898 Other specified noninflammatory disorders of vagina: Secondary | ICD-10-CM

## 2017-11-24 DIAGNOSIS — J329 Chronic sinusitis, unspecified: Secondary | ICD-10-CM

## 2017-11-24 MED ORDER — METHYLPREDNISOLONE 4 MG PO TBPK: ORAL_TABLET | ORAL | 0 refills | Status: DC

## 2017-11-24 MED ORDER — FLUCONAZOLE 150 MG PO TABS: ORAL_TABLET | ORAL | 0 refills | Status: AC

## 2017-11-24 MED ORDER — LEVOCETIRIZINE DIHYDROCHLORIDE 5 MG PO TABS: 5.0000 mg | ORAL_TABLET | Freq: Every evening | ORAL | 11 refills | Status: DC

## 2017-11-24 MED ORDER — AMOXICILLIN-POT CLAVULANATE 875-125 MG PO TABS: 1.0000 | ORAL_TABLET | Freq: Two times a day (BID) | ORAL | 0 refills | Status: DC

## 2017-11-24 NOTE — Progress Notes (Signed)
Subjective:    Patient ID: Janet Richards, female    DOB: April 09, 1961, 57 y.o.   MRN: [id number]  HPI  Pt is a 57 yo female with hx of allergic rhinitis/sinusitis who presents to the clinic with sinus pressure, jaw pain, and ear pain. She has a hx of poor dentition and needed multiple teeth pulled. She recently had her "gums cleaned" and has a hx of getting "infections" after this. For last 3 days her ears feel "poppy" and "can barely hear". No fever, chills, body aches. She has not tried anything to make better.   .. Active Ambulatory Problems    Diagnosis Date Noted  . Primary osteoarthritis of both knees 12/27/2012  . Exertional chest pain 02/05/2013  . Abnormal uterine bleeding 02/05/2013  . Essential hypertension, benign 03/01/2013  . No blood products 03/28/2013  . 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 Osteoarthritis of first metatarsophalangeal joint 04/11/2014  . IFG (impaired fasting glucose) 10/17/2014  . Chronic allergic rhinitis 01/14/2015  . Hyperlipidemia 02/20/2015  . History of colonic polyps 03/20/2015  . Paresthesias 07/01/2015  . Right ankle pain 02/24/2016  . Fatigue 02/24/2016  . Anemia, iron deficiency 02/25/2016  . Chronic dental infection 04/11/2016  . Ganglion cyst of right foot 07/12/2016  . Burning sensation of foot 09/02/2016  . Abnormal ankle brachial index (ABI) 11/09/2016  . Vaginal dryness, menopausal 03/01/2017  . Neuropathy 03/01/2017  . Hypertriglyceridemia 09/21/2017   Resolved Ambulatory Problems    Diagnosis Date Noted  . Allergic urticaria 12/27/2012  . Dental abscess 02/05/2013  . Menstrual migraine 02/05/2013  . Right lumbar radiculitis 04/19/2013  . Hyperglycemia 08/05/2013  . Allergic rhinitis 01/22/2014  . Left knee pain 02/25/2014  . Dental infection 03/20/2014  . Allergic sinusitis 01/14/2015  . Pain, dental 01/14/2015  . Sinusitis 06/26/2015  . Chest pain 07/08/2015  . Right knee injury 07/15/2015  . Neck pain 09/01/2015   . UTI (urinary tract infection) 11/25/2015  . Acute upper respiratory infection 12/22/2015  . Low blood potassium 05/24/2016  . Elevated lipase 05/24/2016  . Insect bite 05/25/2016   Past Medical History:  Diagnosis Date  . Essential hypertension, benign   . Menstrual migraine   . Periodontitis   . Primary osteoarthritis of both knees   . Urticaria      Review of Systems  All other systems reviewed and are negative.      Objective:   Physical Exam  Constitutional: She is oriented to person, place, and time. She appears well-developed and well-nourished.  HENT:  Head: Normocephalic and atraumatic.  Right Ear: External ear normal.  Left Ear: External ear normal.  Nose: Nose normal.  Mouth/Throat: Oropharynx is clear and moist. No oropharyngeal exudate.  TM's clear bilaterally.  Tenderness over maxillary sinuses.  Some tenderness around TMJ bilaterally.   Eyes: Conjunctivae and EOM are normal. Pupils are equal, round, and reactive to light. Right eye exhibits no discharge. Left eye exhibits no discharge.  Neck: Normal range of motion. Neck supple.  Cardiovascular: Normal rate, regular rhythm and normal heart sounds.  Pulmonary/Chest: Effort normal and breath sounds normal.  Lymphadenopathy:    She has no cervical adenopathy.  Neurological: She is alert and oriented to person, place, and time.  Psychiatric: She has a normal mood and affect. Her behavior is normal.          Assessment & Plan:  Marland KitchenMarland KitchenBraelynn was seen today for headache and sinus pressure.  Diagnoses and all orders for this visit:  Recurrent  sinusitis -     levocetirizine (XYZAL) 5 MG tablet; Take 1 tablet (5 mg total) by mouth every evening. -     amoxicillin-clavulanate (AUGMENTIN) 875-125 MG tablet; Take 1 tablet by mouth 2 (two) times daily.  Dysfunction of both eustachian tubes -     methylPREDNISolone (MEDROL DOSEPAK) 4 MG TBPK tablet; Take as directed by package insert. -     levocetirizine  (XYZAL) 5 MG tablet; Take 1 tablet (5 mg total) by mouth every evening.  Vaginal irritation -     fluconazole (DIFLUCAN) 150 MG tablet; Take one tab, may take second tab if no improvement after 72 hours.   I suspect ETD in combination with some allergic sinusitis. Stop zyrtec. Start xyzal. Start medrol dose pack. Continue flonase. HO given. Could be some TMJ especially after dental procedure. Consider ibuprofen and warm compresses. If not improving could add augmentin. Printed rx since the weekend and given diflucan if needed for yeast infection after abx.

## 2017-11-24 NOTE — Patient Instructions (Signed)
Eustachian Tube Dysfunction The eustachian tube connects the middle ear to the back of the nose. It regulates air pressure in the middle ear by allowing air to move between the ear and nose. It also helps to drain fluid from the middle ear space. When the eustachian tube does not function properly, air pressure, fluid, or both can build up in the middle ear. Eustachian tube dysfunction can affect one or both ears. What are the causes? This condition happens when the eustachian tube becomes blocked or cannot open normally. This may result from:  Ear infections.  Colds and other upper respiratory infections.  Allergies.  Irritation, such as from cigarette smoke or acid from the stomach coming up into the esophagus (gastroesophageal reflux).  Sudden changes in air pressure, such as from descending in an airplane.  Abnormal growths in the nose or throat, such as nasal polyps, tumors, or enlarged tissue at the back of the throat (adenoids).  What increases the risk? This condition may be more likely to develop in people who smoke and people who are overweight. Eustachian tube dysfunction may also be more likely to develop in children, especially children who have:  Certain birth defects of the mouth, such as cleft palate.  Large tonsils and adenoids.  What are the signs or symptoms? Symptoms of this condition may include:  A feeling of fullness in the ear.  Ear pain.  Clicking or popping noises in the ear.  Ringing in the ear.  Hearing loss.  Loss of balance.  Symptoms may get worse when the air pressure around you changes, such as when you travel to an area of high elevation or fly on an airplane. How is this diagnosed? This condition may be diagnosed based on:  Your symptoms.  A physical exam of your ear, nose, and throat.  Tests, such as those that measure: ? The movement of your eardrum (tympanogram). ? Your hearing (audiometry).  How is this treated? Treatment  depends on the cause and severity of your condition. If your symptoms are mild, you may be able to relieve your symptoms by moving air into ("popping") your ears. If you have symptoms of fluid in your ears, treatment may include:  Decongestants.  Antihistamines.  Nasal sprays or ear drops that contain medicines that reduce swelling (steroids).  In some cases, you may need to have a procedure to drain the fluid in your eardrum (myringotomy). In this procedure, a small tube is placed in the eardrum to:  Drain the fluid.  Restore the air in the middle ear space.  Follow these instructions at home:  Take over-the-counter and prescription medicines only as told by your health care provider.  Use techniques to help pop your ears as recommended by your health care provider. These may include: ? Chewing gum. ? Yawning. ? Frequent, forceful swallowing. ? Closing your mouth, holding your nose closed, and gently blowing as if you are trying to blow air out of your nose.  Do not do any of the following until your health care provider approves: ? Travel to high altitudes. ? Fly in airplanes. ? Work in a pressurized cabin or room. ? Scuba dive.  Keep your ears dry. Dry your ears completely after showering or bathing.  Do not smoke.  Keep all follow-up visits as told by your health care provider. This is important. Contact a health care provider if:  Your symptoms do not go away after treatment.  Your symptoms come back after treatment.  You are   unable to pop your ears.  You have: ? A fever. ? Pain in your ear. ? Pain in your head or neck. ? Fluid draining from your ear.  Your hearing suddenly changes.  You become very dizzy.  You lose your balance. This information is not intended to replace advice given to you by your health care provider. Make sure you discuss any questions you have with your health care provider. Document Released: 11/20/2015 Document Revised: 03/31/2016  Document Reviewed: 11/12/2014 Elsevier Interactive Patient Education  2018 [employer]  

## 2017-11-26 ENCOUNTER — Encounter: Payer: Self-pay | Admitting: Physician Assistant

## 2017-11-26 DIAGNOSIS — H6983 Other specified disorders of Eustachian tube, bilateral: Secondary | ICD-10-CM | POA: Insufficient documentation

## 2017-11-26 DIAGNOSIS — J329 Chronic sinusitis, unspecified: Secondary | ICD-10-CM | POA: Insufficient documentation

## 2017-11-28 ENCOUNTER — Ambulatory Visit (INDEPENDENT_AMBULATORY_CARE_PROVIDER_SITE_OTHER): Payer: Medicare Other | Admitting: Obstetrics & Gynecology

## 2017-11-28 ENCOUNTER — Telehealth: Payer: Self-pay | Admitting: Physician Assistant

## 2017-11-28 ENCOUNTER — Encounter: Payer: Self-pay | Admitting: Obstetrics & Gynecology

## 2017-11-28 VITALS — BP 140/75 | HR 89 | Ht 63.0 in | Wt 186.0 lb

## 2017-11-28 DIAGNOSIS — R350 Frequency of micturition: Secondary | ICD-10-CM

## 2017-11-28 DIAGNOSIS — Z01419 Encounter for gynecological examination (general) (routine) without abnormal findings: Secondary | ICD-10-CM | POA: Diagnosis not present

## 2017-11-28 DIAGNOSIS — N951 Menopausal and female climacteric states: Secondary | ICD-10-CM

## 2017-11-28 NOTE — Telephone Encounter (Signed)
Sound great! Janet Richards can you adjust this in the chart.

## 2017-11-28 NOTE — Telephone Encounter (Signed)
Is she referring to medrol dose pak? Did she start augmentin?  We replaced zyrtec with xyzal is that what she is referring too?

## 2017-11-28 NOTE — Telephone Encounter (Signed)
Pt stated she has stopped taking the xyzal because it made her dizzy and is going back to the zyrtec. Thanks

## 2017-11-28 NOTE — Telephone Encounter (Signed)
Patient called adv that she needs a few more days on the new medicine you prescribed for her she isnt sure if its working or not. Had heartburn/ acid reflux yesterday but it seems to be helping so just wanted to let you know she needed a couple more days to make sure before switching back to prevs med. Thanks

## 2017-11-29 ENCOUNTER — Encounter: Payer: Self-pay | Admitting: Obstetrics & Gynecology

## 2017-11-29 ENCOUNTER — Telehealth: Payer: Self-pay | Admitting: *Deleted

## 2017-11-29 LAB — FOLLICLE STIMULATING HORMONE: FSH: 46.1 m[IU]/mL

## 2017-11-29 NOTE — Progress Notes (Signed)
Subjective:     Janet Richards is a 57 y.o. female here for a routine exam.  Current complaints: wonders if she is in menopause.  Last period was 09/2017.  Has light monthly menstrual cycle.  +hot flashes.  Is using vaginal estrogen 1-2 times a day.  Encouraged to stop as this is not the intention of hte medication.  She has no vaginal complaints today.  Will see how she does off the medication.       Gynecologic History Patient's last menstrual period was 09/24/2017. Contraception: tubal ligation Last Pap: 2017. Results were: normal Last mammogram: 08/2017. Results were: normal  Obstetric History OB History  Gravida Para Term Preterm AB Living  5 3 3   2 3   SAB TAB Ectopic Multiple Live Births    2          # Outcome Date GA Lbr Len/2nd Weight Sex Delivery Anes PTL Lv  5 Term      Vag-Spont     4 Term      Vag-Spont     3 Term      Vag-Spont     2 TAB           1 TAB                The following portions of the patient's history were reviewed and updated as appropriate: allergies, current medications, past family history, past medical history, past social history, past surgical history and problem list.  Review of Systems Pertinent items noted in HPI and remainder of comprehensive ROS otherwise negative.    Objective:      Vitals:   11/28/17 1442  BP: 140/75  Pulse: 89  Weight: 186 lb (84.4 kg)  Height: 5\' 3"  (1.6 m)   Vitals:  WNL General appearance: alert, cooperative and no distress  HEENT: Normocephalic, without obvious abnormality, atraumatic Eyes: negative Throat: lips, mucosa, and tongue normal; teeth and gums normal  Respiratory: Clear to auscultation bilaterally  CV: Regular rate and rhythm  Breasts:  Normal appearance, no masses or tenderness, no nipple retraction or dimpling  GI: Soft, non-tender; bowel sounds normal; no masses,  no organomegaly  GU: External Genitalia:  Tanner V, no lesion Urethra:  No prolapse   Vagina: Pink, normal rugae, no  blood or discharge  Cervix: No CMT, no lesion  Uterus:  Normal size and contour, non tender  Adnexa: Normal, no masses, non tender  Musculoskeletal: No edema, redness or tenderness in the calves or thighs  Skin: No lesions or rash  Lymphatic: Axillary adenopathy: none     Psychiatric: Normal mood and behavior        Assessment:    Healthy female exam.    Plan:   Pap due 2020 (q3 years) Yearly mammogram [place] today and patient to keep menstrual calendar. Stop Estrace and see if symptoms return.

## 2017-11-29 NOTE — Telephone Encounter (Signed)
Pt notified of [place] levels and welcomed her into the menopausal groups.  Pt also states that she can't remember her password for my chart and I gave her info on how to reset her password.

## 2017-11-29 NOTE — Telephone Encounter (Signed)
-----   Message from Guss Bunde, MD sent at 11/29/2017 11:26 AM EST ----- [facility] is menopausal range.   RN to call.  Encourage to sign up for my chart.

## 2017-11-30 LAB — URINE CULTURE
MICRO NUMBER:: 90092127
SPECIMEN QUALITY:: ADEQUATE

## 2017-12-11 ENCOUNTER — Ambulatory Visit: Payer: Self-pay | Admitting: Physician Assistant

## 2017-12-15 ENCOUNTER — Encounter: Payer: Self-pay | Admitting: Physician Assistant

## 2017-12-15 ENCOUNTER — Ambulatory Visit (INDEPENDENT_AMBULATORY_CARE_PROVIDER_SITE_OTHER): Payer: Medicare Other | Admitting: Physician Assistant

## 2017-12-15 VITALS — BP 123/60 | HR 90 | Ht 63.0 in | Wt 183.0 lb

## 2017-12-15 DIAGNOSIS — K0530 Chronic periodontitis, unspecified: Secondary | ICD-10-CM | POA: Insufficient documentation

## 2017-12-15 DIAGNOSIS — K047 Periapical abscess without sinus: Secondary | ICD-10-CM

## 2017-12-15 MED ORDER — AMOXICILLIN-POT CLAVULANATE 875-125 MG PO TABS: 1.0000 | ORAL_TABLET | Freq: Two times a day (BID) | ORAL | 0 refills | Status: DC

## 2017-12-15 NOTE — Progress Notes (Signed)
Subjective:    Patient ID: Janet Richards, female    DOB: 10/21/1961, 57 y.o.   MRN: [id number]  HPI  Pt is a 57 yo female with periodontal disease and poor dentition who presents to the clinic with left lower jaw pain and tooth abscess of wisdom tooth. She is having her tooth pulled next week and would like to be put on preventative abx. She has a hx of getting really sick after getting teeth pulled. Gets teeith cleaned every 3 months.   .. Active Ambulatory Problems    Diagnosis Date Noted  . Primary osteoarthritis of both knees 12/27/2012  . Exertional chest pain 02/05/2013  . Abnormal uterine bleeding 02/05/2013  . Essential hypertension, benign 03/01/2013  . No blood products 03/28/2013  . 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 Osteoarthritis of first metatarsophalangeal joint 04/11/2014  . IFG (impaired fasting glucose) 10/17/2014  . Chronic allergic rhinitis 01/14/2015  . Hyperlipidemia 02/20/2015  . History of colonic polyps 03/20/2015  . Paresthesias 07/01/2015  . Right ankle pain 02/24/2016  . Fatigue 02/24/2016  . Anemia, iron deficiency 02/25/2016  . Chronic dental infection 04/11/2016  . Ganglion cyst of right foot 07/12/2016  . Burning sensation of foot 09/02/2016  . Abnormal ankle brachial index (ABI) 11/09/2016  . Vaginal dryness, menopausal 03/01/2017  . Neuropathy 03/01/2017  . Hypertriglyceridemia 09/21/2017  . Dysfunction of both eustachian tubes 11/26/2017  . Recurrent sinusitis 11/26/2017  . Periodontitis 12/15/2017   Resolved Ambulatory Problems    Diagnosis Date Noted  . Allergic urticaria 12/27/2012  . Dental abscess 02/05/2013  . Menstrual migraine 02/05/2013  . Right lumbar radiculitis 04/19/2013  . Hyperglycemia 08/05/2013  . Allergic rhinitis 01/22/2014  . Left knee pain 02/25/2014  . Dental infection 03/20/2014  . Allergic sinusitis 01/14/2015  . Pain, dental 01/14/2015  . Sinusitis 06/26/2015  . Chest pain 07/08/2015  . Right knee  injury 07/15/2015  . Neck pain 09/01/2015  . UTI (urinary tract infection) 11/25/2015  . Acute upper respiratory infection 12/22/2015  . Low blood potassium 05/24/2016  . Elevated lipase 05/24/2016  . Insect bite 05/25/2016   Past Medical History:  Diagnosis Date  . Essential hypertension, benign   . Menstrual migraine   . Periodontitis   . Primary osteoarthritis of both knees   . Urticaria       Review of Systems  All other systems reviewed and are negative.      Objective:   Physical Exam  Constitutional: She is oriented to person, place, and time. She appears well-developed and well-nourished.  HENT:  Head: Normocephalic and atraumatic.  Right Ear: External ear normal.  Left Ear: External ear normal.  Nose: Nose normal.  Left lower side wisdom tooth decayed with erythema at the base.  Poor dentition.   Eyes: Conjunctivae are normal.  Neck: Normal range of motion. Neck supple.  Cardiovascular: Normal rate, regular rhythm and normal heart sounds.  Pulmonary/Chest: Effort normal and breath sounds normal.  Lymphadenopathy:    She has no cervical adenopathy.  Neurological: She is alert and oriented to person, place, and time.  Psychiatric: She has a normal mood and affect. Her behavior is normal.          Assessment & Plan:  Marland KitchenMarland KitchenJazzmon was seen today for dental pain and knee pain.  Diagnoses and all orders for this visit:  Tooth abscess -     amoxicillin-clavulanate (AUGMENTIN) 875-125 MG tablet; Take 1 tablet by mouth 2 (two) times daily. For 7 days.  Periodontitis -     amoxicillin-clavulanate (AUGMENTIN) 875-125 MG tablet; Take 1 tablet by mouth 2 (two) times daily. For 7 days.  Treated with augmentin for 10 days for prevention. Continue to follow up every 3 months for periodontal cleanings.

## 2017-12-17 ENCOUNTER — Encounter: Payer: Self-pay | Admitting: Physician Assistant

## 2017-12-25 ENCOUNTER — Ambulatory Visit (INDEPENDENT_AMBULATORY_CARE_PROVIDER_SITE_OTHER): Payer: Medicare Other | Admitting: Physician Assistant

## 2017-12-25 ENCOUNTER — Encounter: Payer: Self-pay | Admitting: Physician Assistant

## 2017-12-25 VITALS — BP 123/76 | HR 92 | Temp 98.5°F | Wt 185.0 lb

## 2017-12-25 DIAGNOSIS — L249 Irritant contact dermatitis, unspecified cause: Secondary | ICD-10-CM

## 2017-12-25 DIAGNOSIS — Z98818 Other dental procedure status: Secondary | ICD-10-CM | POA: Diagnosis not present

## 2017-12-25 MED ORDER — TRIAMCINOLONE ACETONIDE 0.1 % EX CREA: 1.0000 "application " | TOPICAL_CREAM | Freq: Two times a day (BID) | CUTANEOUS | 0 refills | Status: AC

## 2017-12-25 NOTE — Progress Notes (Signed)
HPI:                                                                LUNDYN COSTE is a 57 y.o. female who presents to [place]: [place] today for dental pain and rash  Patient had left lower wisdom tooth extracted 7 days ago. She states she has been having severe pain radiating to her ear and some facial swelling. Denies fever, malaise, or drainage from the area of extraction. She has been gargling with peroxide. She reports she has a history of periodontal disease. She was placed on Augmentin by her PCP 10 days ago and completed full week of therapy.  She also reports a pruritic rash on her bilateral lower neck present for 3 days. States she wore something that irritated her skin. She has been applying topical corticosteroid, which has helped, but she ran out of it.     Depression screen [facility] 2/9 12/15/2017 03/27/2017  Decreased Interest 0 0  Down, Depressed, Hopeless 0 0  PHQ - 2 Score 0 0  Some recent data might be hidden    No flowsheet data found.    Past Medical History:  Diagnosis Date  . Essential hypertension, benign   . Menstrual migraine   . Periodontitis   . Primary osteoarthritis of both knees   . Urticaria    Past Surgical History:  Procedure Laterality Date  . TUBAL LIGATION  12/26/1987   Social History   Tobacco Use  . Smoking status: Former Smoker    Packs/day: 1.50    Types: Cigarettes    Last attempt to quit: 02/05/2006    Years since quitting: 11.8  . Smokeless tobacco: Never Used  Substance Use Topics  . Alcohol use: Yes    Comment: rarely   family history includes CAD in her father; Hypertension in her mother.    ROS: negative except as noted in the HPI  Medications: Current Outpatient Medications  Medication Sig Dispense Refill  . albuterol (PROAIR HFA) 108 (90 Base) MCG/ACT inhaler INHALE TWO PUFFS EVERY 4-6 HOURS ONLY AS NEEDED FOR SHORTNESS OF BREATH OR WHEEZING. 8.5 Inhaler 0  . Biotin 1000  MCG CHEW Chew by mouth.    . cetirizine HCl (ZYRTEC) 1 MG/ML solution TAKE 10 MLS (10 MG TOTAL) BY MOUTH DAILY. 944 mL 4  . conjugated estrogens (PREMARIN) vaginal cream Place 1 Applicatorful vaginally daily. For first 2 weeks then decrease to 3 times a week. 42.5 g 12  . hydrochlorothiazide (HYDRODIURIL) 25 MG tablet TAKE 1 TABLET (25 MG TOTAL) BY MOUTH DAILY. 90 tablet 0  . montelukast (SINGULAIR) 10 MG tablet Take 10 mg by mouth at bedtime.     . pantoprazole (PROTONIX) 40 MG tablet TAKE 1 TABLET (40 MG TOTAL) BY MOUTH DAILY. 90 tablet 1  . Probiotic Product (PROBIOTIC-10) CAPS Take by mouth.     No current facility-administered medications for this visit.    Allergies  Allergen Reactions  . Azithromycin Nausea And Vomiting  . Clindamycin/Lincomycin Hives    Facial swelling  . Metronidazole Hives    Rash, has had fluconazole (Diflucan) w/o reaction  . Sulfa Antibiotics Hives       Objective:  BP 123/76   Pulse 92  Temp 98.5 F (36.9 C) (Oral)   Wt 185 lb (83.9 kg)   BMI 32.77 kg/m  Gen:  alert, not ill-appearing, no distress, appropriate for age 53: head normocephalic without obvious abnormality, conjunctiva and cornea clear, there is edema of the bilateral lower eyelids slightly worse on the right, left lower posterior gum with mild edema and a 1 cm opening with some white exudate, moist mucous membranes, no oral lesions,  trachea midline Pulm: Normal work of breathing, normal phonation  Neuro: alert and oriented x 3, no tremor MSK: extremities atraumatic, normal gait and station Skin: symmetric, hyperpigmented rash on lateral lower neck bilaterally; no vesicles, no induration Psych: well-groomed, cooperative, good eye contact, euthymic mood, affect mood-congruent, speech is articulate, and thought processes clear and goal-directed    No results found for this or any previous visit (from the past 72 hour(s)). No results found.    Assessment and Plan: 57 y.o.  female with  1. Irritant dermatitis - triamcinolone cream (KENALOG) 0.1 %; Apply 1 application topically 2 (two) times daily for 14 days.  Dispense: 30 g; Refill: 0  2. S/P wisdom tooth extraction - I don't think there is any active infection or dry socket. However, we discussed importance of appropriate follow-up with her oral surgeon  Patient education and anticipatory guidance given Patient agrees with treatment plan Follow-up as needed if symptoms worsen or fail to improve  Darlyne Russian PA-C

## 2017-12-25 NOTE — Patient Instructions (Signed)
-   apply steroid cream to affected areas of your neck twice a day for no longer than 2 weeks. Avoid contact with face/eyes  - contact your oral surgeon about your dental pain. Follow all of his post-op instructions.

## 2018-01-02 ENCOUNTER — Ambulatory Visit: Payer: Self-pay | Admitting: Physician Assistant

## 2018-01-03 ENCOUNTER — Ambulatory Visit (INDEPENDENT_AMBULATORY_CARE_PROVIDER_SITE_OTHER): Payer: Medicare Other | Admitting: Family Medicine

## 2018-01-03 VITALS — BP 120/79 | HR 80 | Temp 98.7°F | Wt 186.0 lb

## 2018-01-03 DIAGNOSIS — M25562 Pain in left knee: Secondary | ICD-10-CM

## 2018-01-03 DIAGNOSIS — J069 Acute upper respiratory infection, unspecified: Secondary | ICD-10-CM | POA: Diagnosis not present

## 2018-01-03 DIAGNOSIS — M170 Bilateral primary osteoarthritis of knee: Secondary | ICD-10-CM | POA: Diagnosis not present

## 2018-01-03 DIAGNOSIS — K047 Periapical abscess without sinus: Secondary | ICD-10-CM

## 2018-01-03 DIAGNOSIS — M25561 Pain in right knee: Secondary | ICD-10-CM | POA: Diagnosis not present

## 2018-01-03 NOTE — Patient Instructions (Signed)
Thank you for coming in today. Finish the clindamycin antibiotics.  Let me know how you do .  If not better recheck.  Call or go to the ER if you develop a large red swollen joint with extreme pain or oozing puss.

## 2018-01-04 ENCOUNTER — Encounter: Payer: Self-pay | Admitting: Family Medicine

## 2018-01-04 NOTE — Progress Notes (Signed)
Janet Richards is a 57 y.o. female who presents to [place]: [place] today for bilateral knee pain, and congestion sore throat.  Marliss notes bilateral knee pain occurring off and on over the last several years worsening recently.  She has been diagnosed in the past with bilateral knee DJD.  In the past she has benefited from steroid knee injections and she is interested in that today if possible.  She is tried over-the-counter medicines for pain which have been moderately helpful. She notes some swelling but denies any locking or catching.  No fevers or chills.  Decreased she also notes sore throat and congestion.  She has a history of dental infections and has been in the process of having most of her teeth removed.  Her left lower wisdom tooth was removed about 2 weeks ago.  This is complicated by infection and she is still currently taking clindamycin for dental infection.  She notes a few days ago she developed a mild sore throat and congestion and wonders if she has a worsening dental infection.  No fevers or chills as noted above.   Past Medical History:  Diagnosis Date  . Essential hypertension, benign   . Menstrual migraine   . Periodontitis   . Primary osteoarthritis of both knees   . Urticaria    Past Surgical History:  Procedure Laterality Date  . TUBAL LIGATION  12/26/1987   Social History   Tobacco Use  . Smoking status: Former Smoker    Packs/day: 1.50    Types: Cigarettes    Last attempt to quit: 02/05/2006    Years since quitting: 11.9  . Smokeless tobacco: Never Used  Substance Use Topics  . Alcohol use: Yes    Comment: rarely   family history includes CAD in her father; Hypertension in her mother.  ROS as above:  Medications: Current Outpatient Medications  Medication Sig Dispense Refill  . albuterol (PROAIR HFA) 108 (90 Base) MCG/ACT  inhaler INHALE TWO PUFFS EVERY 4-6 HOURS ONLY AS NEEDED FOR SHORTNESS OF BREATH OR WHEEZING. 8.5 Inhaler 0  . Biotin 1000 MCG CHEW Chew by mouth.    . cetirizine HCl (ZYRTEC) 1 MG/ML solution TAKE 10 MLS (10 MG TOTAL) BY MOUTH DAILY. 944 mL 4  . conjugated estrogens (PREMARIN) vaginal cream Place 1 Applicatorful vaginally daily. For first 2 weeks then decrease to 3 times a week. 42.5 g 12  . hydrochlorothiazide (HYDRODIURIL) 25 MG tablet TAKE 1 TABLET (25 MG TOTAL) BY MOUTH DAILY. 90 tablet 0  . montelukast (SINGULAIR) 10 MG tablet Take 10 mg by mouth at bedtime.     . pantoprazole (PROTONIX) 40 MG tablet TAKE 1 TABLET (40 MG TOTAL) BY MOUTH DAILY. 90 tablet 1  . Probiotic Product (PROBIOTIC-10) CAPS Take by mouth.    . triamcinolone cream (KENALOG) 0.1 % Apply 1 application topically 2 (two) times daily for 14 days. 30 g 0   No current facility-administered medications for this visit.    Allergies  Allergen Reactions  . Azithromycin Nausea And Vomiting  . Clindamycin/Lincomycin Hives    Facial swelling  . Metronidazole Hives    Rash, has had fluconazole (Diflucan) w/o reaction  . Sulfa Antibiotics Hives    Health Maintenance Health Maintenance  Topic Date Due  . Hepatitis C Screening  October 02, 1961  . MAMMOGRAM  08/19/2019  . PAP SMEAR  10/26/2019  . TETANUS/TDAP  10/17/2024  . COLONOSCOPY  03/19/2025  .  INFLUENZA VACCINE  Completed  . HIV Screening  Completed     Exam:  BP 120/79   Pulse 80   Temp 98.7 F (37.1 C) (Oral)   Wt 186 lb (84.4 kg)   BMI 32.95 kg/m   Gen: Well NAD HEENT: EOMI,  MMM left lower wisdom tooth socket visible with intact appearing cervical suture with no discharge.  Nontender surrounding gum.  Normal tympanic membranes bilaterally.  No cervical lymphadenopathy.  Clear nasal discharge. Lungs: Normal work of breathing. CTABL Heart: RRR no MRG Abd: NABS, Soft. Nondistended, Nontender Exts: Brisk capillary refill, warm and well perfused.  MSK: Right  knee no effusion normal-appearing Range of motion 0-120 degrees.  1+ retropatellar crepitations present Tender to palpation medial joint line. Stable ligamentous exam. Negative McMurray's test. Intact flexion and extension strength.  Left knee small effusion normal-appearing Range of motion 0-120 degrees.  1+ retropatellar crepitations present Tender to palpation medial joint line. Stable ligamentous exam. Negative McMurray's test. Intact flexion and extension strength.  Procedure: Real-time Ultrasound Guided Injection of Right Knee  Device: GE Logiq E   Images permanently stored and available for review in the ultrasound unit. Verbal informed consent obtained.  Discussed risks and benefits of procedure. Warned about infection bleeding damage to structures skin hypopigmentation and fat atrophy among others. Patient expresses understanding and agreement Time-out conducted.   Noted no overlying erythema, induration, or other signs of local infection.   Skin prepped in a sterile fashion.   Local anesthesia: Topical Ethyl chloride.   With sterile technique and under real time ultrasound guidance:  80mg  depomedrol and 47ml marcaine injected easily.   Completed without difficulty   Pain immediately resolved suggesting accurate placement of the medication.   Advised to call if fevers/chills, erythema, induration, drainage, or persistent bleeding.   Images permanently stored and available for review in the ultrasound unit.  Impression: Technically successful ultrasound guided injection.  Procedure: Real-time Ultrasound Guided Injection of left Knee  Device: GE Logiq E   Images permanently stored and available for review in the ultrasound unit. Verbal informed consent obtained.  Discussed risks and benefits of procedure. Warned about infection bleeding damage to structures skin hypopigmentation and fat atrophy among others. Patient expresses understanding and agreement Time-out conducted.     Noted no overlying erythema, induration, or other signs of local infection.   Skin prepped in a sterile fashion.   Local anesthesia: Topical Ethyl chloride.   With sterile technique and under real time ultrasound guidance:  80mg  depomedrol and 4ml marcaine injected easily.   Completed without difficulty   Pain immediately resolved suggesting accurate placement of the medication.   Advised to call if fevers/chills, erythema, induration, drainage, or persistent bleeding.   Images permanently stored and available for review in the ultrasound unit.  Impression: Technically successful ultrasound guided injection.  Knee xray 07/15/15 reviewed showing DJD.  Personally and independently reviewed the images    Assessment and Plan: 57 y.o. female with  Bilateral knee pain is very likely due to DJD.  Plan for bilateral knee injection today as noted above.  Return to clinic for recheck if not improving.  Continue activities as tolerated.  Recommend quadricep strengthening and weight loss to help also managed pain due to DJD.   Sore Throat is likely viral URI. Plan for watchful waiting and OTC medication.    Dental infection improving. Finish ABX.    No orders of the defined types were placed in this encounter.  No orders of the  defined types were placed in this encounter.    Discussed warning signs or symptoms. Please see discharge instructions. Patient expresses understanding.

## 2018-01-05 ENCOUNTER — Ambulatory Visit: Payer: Self-pay | Admitting: Family Medicine

## 2018-01-09 ENCOUNTER — Ambulatory Visit (INDEPENDENT_AMBULATORY_CARE_PROVIDER_SITE_OTHER): Payer: Medicare Other | Admitting: Physician Assistant

## 2018-01-09 ENCOUNTER — Encounter: Payer: Self-pay | Admitting: Physician Assistant

## 2018-01-09 VITALS — BP 121/79 | HR 92 | Ht 63.0 in | Wt 181.0 lb

## 2018-01-09 DIAGNOSIS — N898 Other specified noninflammatory disorders of vagina: Secondary | ICD-10-CM | POA: Diagnosis not present

## 2018-01-09 MED ORDER — FLUCONAZOLE 150 MG PO TABS: 150.0000 mg | ORAL_TABLET | Freq: Once | ORAL | 0 refills | Status: AC

## 2018-01-09 NOTE — Patient Instructions (Signed)

## 2018-01-09 NOTE — Progress Notes (Signed)
Subjective:    Patient ID: Janet Richards, female    DOB: Feb 13, 1961, 57 y.o.   MRN: [id number]  HPI Patient is a 57 year old female with 2 recent antibiotic treatment who presents to the clinic with some vaginal itching and discharge.  Her symptoms started 2 days ago.  She has a history of yeast infections after antibiotics.  She describes the discharge as white and thick.  She has not tried anything to make better.  She denies any fever, chills, body aches, flank pain, lower abdominal pain, and dysuria.  .. Active Ambulatory Problems    Diagnosis Date Noted  . Primary osteoarthritis of both knees 12/27/2012  . Exertional chest pain 02/05/2013  . Abnormal uterine bleeding 02/05/2013  . Essential hypertension, benign 03/01/2013  . No blood products 03/28/2013  . 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 Osteoarthritis of first metatarsophalangeal joint 04/11/2014  . IFG (impaired fasting glucose) 10/17/2014  . Chronic allergic rhinitis 01/14/2015  . Hyperlipidemia 02/20/2015  . History of colonic polyps 03/20/2015  . Paresthesias 07/01/2015  . Right ankle pain 02/24/2016  . Fatigue 02/24/2016  . Anemia, iron deficiency 02/25/2016  . Chronic dental infection 04/11/2016  . Ganglion cyst of right foot 07/12/2016  . Burning sensation of foot 09/02/2016  . Abnormal ankle brachial index (ABI) 11/09/2016  . Vaginal dryness, menopausal 03/01/2017  . Neuropathy 03/01/2017  . Hypertriglyceridemia 09/21/2017  . Dysfunction of both eustachian tubes 11/26/2017  . Recurrent sinusitis 11/26/2017  . Periodontitis 12/15/2017   Resolved Ambulatory Problems    Diagnosis Date Noted  . Allergic urticaria 12/27/2012  . Dental abscess 02/05/2013  . Menstrual migraine 02/05/2013  . Right lumbar radiculitis 04/19/2013  . Hyperglycemia 08/05/2013  . Allergic rhinitis 01/22/2014  . Left knee pain 02/25/2014  . Dental infection 03/20/2014  . Allergic sinusitis 01/14/2015  . Pain, dental 01/14/2015   . Sinusitis 06/26/2015  . Chest pain 07/08/2015  . Right knee injury 07/15/2015  . Neck pain 09/01/2015  . UTI (urinary tract infection) 11/25/2015  . Acute upper respiratory infection 12/22/2015  . Low blood potassium 05/24/2016  . Elevated lipase 05/24/2016  . Insect bite 05/25/2016   Past Medical History:  Diagnosis Date  . Essential hypertension, benign   . Menstrual migraine   . Periodontitis   . Primary osteoarthritis of both knees   . Urticaria       Review of Systems  All other systems reviewed and are negative.      Objective:   Physical Exam  Constitutional: She is oriented to person, place, and time. She appears well-developed and well-nourished.  HENT:  Head: Normocephalic and atraumatic.  Cardiovascular: Normal rate, regular rhythm and normal heart sounds.  Pulmonary/Chest: Effort normal and breath sounds normal.  No CVA tenderness.   Abdominal: Soft. Bowel sounds are normal. There is no tenderness.  Neurological: She is alert and oriented to person, place, and time.  Psychiatric: She has a normal mood and affect. Her behavior is normal.          Assessment & Plan:  Marland KitchenMarland KitchenBeverly was seen today for vaginal itching and vaginal discharge.  Diagnoses and all orders for this visit:  Vaginal itching -     WET PREP FOR TRICH, YEAST, CLUE  Vaginal discharge -     WET PREP FOR [place], YEAST, CLUE  Other orders -     fluconazole (DIFLUCAN) 150 MG tablet; Take 1 tablet (150 mg total) by mouth once for 1 dose. Repeat in 48-72 hours if symptoms  persist.   Suspect yeast infection after antibiotic treatments. HO given. Started diflucan and could repeat in 48 to 72 hours if symptoms persist. Wet prep ordered today for confirmation.

## 2018-01-10 LAB — WET PREP FOR TRICH, YEAST, CLUE
MICRO NUMBER:: 90283000
Specimen Quality: ADEQUATE

## 2018-01-10 NOTE — Progress Notes (Signed)
Call pt: yeast was detected she has already been given treatment.

## 2018-01-19 ENCOUNTER — Encounter: Payer: Self-pay | Admitting: Family Medicine

## 2018-01-19 ENCOUNTER — Ambulatory Visit (INDEPENDENT_AMBULATORY_CARE_PROVIDER_SITE_OTHER): Payer: Medicare Other | Admitting: Family Medicine

## 2018-01-19 ENCOUNTER — Other Ambulatory Visit: Payer: Self-pay | Admitting: Physician Assistant

## 2018-01-19 VITALS — BP 110/62 | HR 89 | Ht 63.0 in | Wt 181.0 lb

## 2018-01-19 DIAGNOSIS — M546 Pain in thoracic spine: Secondary | ICD-10-CM | POA: Diagnosis not present

## 2018-01-19 DIAGNOSIS — H9201 Otalgia, right ear: Secondary | ICD-10-CM | POA: Diagnosis not present

## 2018-01-19 NOTE — Progress Notes (Signed)
Subjective:    Patient ID: Janet Richards, female    DOB: 30-Nov-1960, 57 y.o.   MRN: [id number]  HPI  R ear pain x 1 day she denies fevers, sweats, chills. she said she has had some sinus pressure.  Sore throat or cough.  She says she has had a little bit of drainage from the ear.  No worsening or alleviating factors.  No other upper respiratory symptoms.  R sided pain near the flank area 1 day she reports that she has been movings some heavy objects.  No impact injury or trauma.  It does hurt more when she changes position.  No urinary symptoms.  She not had any problems with her bowels.  No active treatments being used at home.  Review of Systems  BP 110/62   Pulse 89   Ht 5\' 3"  (1.6 m)   Wt 181 lb (82.1 kg)   SpO2 98%   BMI 32.06 kg/m     Allergies  Allergen Reactions  . Azithromycin Nausea And Vomiting  . Clindamycin/Lincomycin Hives    Facial swelling  . Metronidazole Hives    Rash, has had fluconazole (Diflucan) w/o reaction  . Sulfa Antibiotics Hives    Past Medical History:  Diagnosis Date  . Essential hypertension, benign   . Menstrual migraine   . Periodontitis   . Primary osteoarthritis of both knees   . Urticaria     Past Surgical History:  Procedure Laterality Date  . TUBAL LIGATION  12/26/1987    Social History   Socioeconomic History  . Marital status: Married    Spouse name: Not on file  . Number of children: 3  . Years of education: Not on file  . Highest education level: Not on file  Social Needs  . Financial resource strain: Not on file  . Food insecurity - worry: Not on file  . Food insecurity - inability: Not on file  . Transportation needs - medical: Not on file  . Transportation needs - non-medical: Not on file  Occupational History  . Occupation: homemaker  Tobacco Use  . Smoking status: Former Smoker    Packs/day: 1.50    Types: Cigarettes    Last attempt to quit: 02/05/2006    Years since quitting: 11.9  . Smokeless  tobacco: Never Used  Substance and Sexual Activity  . Alcohol use: Yes    Comment: rarely  . Drug use: No  . Sexual activity: Yes    Partners: Male  Other Topics Concern  . Not on file  Social History Narrative  . Not on file    Family History  Problem Relation Age of Onset  . Hypertension Mother   . CAD Father        MI in his 32s    Outpatient Encounter Medications as of 01/19/2018  Medication Sig  . albuterol (PROAIR HFA) 108 (90 Base) MCG/ACT inhaler INHALE TWO PUFFS EVERY 4-6 HOURS ONLY AS NEEDED FOR SHORTNESS OF BREATH OR WHEEZING.  . Biotin 1000 MCG CHEW Chew by mouth.  . cetirizine HCl (ZYRTEC) 1 MG/ML solution TAKE 10 MLS (10 MG TOTAL) BY MOUTH DAILY.  [name] conjugated estrogens (PREMARIN) vaginal cream Place 1 Applicatorful vaginally daily. For first 2 weeks then decrease to 3 times a week.  . hydrochlorothiazide (HYDRODIURIL) 25 MG tablet TAKE 1 TABLET BY MOUTH EVERY DAY  . ipratropium (ATROVENT) 0.06 % nasal spray Place 2 sprays into both nostrils 4 (four) times daily.  . montelukast (SINGULAIR) 10  MG tablet Take 10 mg by mouth at bedtime.   . pantoprazole (PROTONIX) 40 MG tablet TAKE 1 TABLET (40 MG TOTAL) BY MOUTH DAILY.  . Probiotic Product (PROBIOTIC-10) CAPS Take by mouth.   No facility-administered encounter medications on file as of 01/19/2018.           Objective:   Physical Exam  Constitutional: She is oriented to person, place, and time. She appears well-developed and well-nourished.  HENT:  Head: Normocephalic and atraumatic.  Right Ear: External ear normal.  Left Ear: External ear normal.  Nose: Nose normal.  Mouth/Throat: Oropharynx is clear and moist.  TMs and canals are clear.   Eyes: Conjunctivae and EOM are normal. Pupils are equal, round, and reactive to light.  Neck: Neck supple. No thyromegaly present.  Cardiovascular: Normal rate, regular rhythm and normal heart sounds.  Pulmonary/Chest: Effort normal and breath sounds normal. She has no  wheezes.  Musculoskeletal:  Tender over her right side distal to the axilla near the flank area.  Normal flexion and extension.  Normal rotation right and left and normal side bending but she did have pain with rotation right and left and with side bending to the right.  Nontender over the thoracic and lumbar spine.  She is tender over the right paraspinous muscles.  Lymphadenopathy:    She has no cervical adenopathy.  Neurological: She is alert and oriented to person, place, and time.  Skin: Skin is warm and dry.  Psychiatric: She has a normal mood and affect.        Assessment & Plan:  Right ear pain-cerumen is blocking my view so we will need to do an irrigation to get take a better look.  She actually felt pretty immediate relief after removing the wax.  She felt like she could hear better.  There was no sign of infection behind the tympanic membrane.  If she has any problems then please let us know.  She can certainly use over-the-counter Debrox drops and home irrigation as needed the future.  Right-sided pain-most consistent with musculoskeletal strain.  Work on gentle stretches.  Contact anti-inflammatory and use heating pad.  Not improving after 1-2 weeks and please give [place] a call back.  No concerning findings that would warrant further workup for any other abdominal issues.

## 2018-01-19 NOTE — Patient Instructions (Addendum)
Back Exercises The following exercises strengthen the muscles that help to support the back. They also help to keep the lower back flexible. Doing these exercises can help to prevent back pain or lessen existing pain. If you have back pain or discomfort, try doing these exercises 2-3 times each day or as told by your health care provider. When the pain goes away, do them once each day, but increase the number of times that you repeat the steps for each exercise (do more repetitions). If you do not have back pain or discomfort, do these exercises once each day or as told by your health care provider. Exercises Single Knee to Chest  Repeat these steps 3-5 times for each leg: 1. Lie on your back on a firm bed or the floor with your legs extended. 2. Bring one knee to your chest. Your other leg should stay extended and in contact with the floor. 3. Hold your knee in place by grabbing your knee or thigh. 4. Pull on your knee until you feel a gentle stretch in your lower back. 5. Hold the stretch for 10-30 seconds. 6. Slowly release and straighten your leg.  Pelvic Tilt  Repeat these steps 5-10 times: 1. Lie on your back on a firm bed or the floor with your legs extended. 2. Bend your knees so they are pointing toward the ceiling and your feet are flat on the floor. 3. Tighten your lower abdominal muscles to press your lower back against the floor. This motion will tilt your pelvis so your tailbone points up toward the ceiling instead of pointing to your feet or the floor. 4. With gentle tension and even breathing, hold this position for 5-10 seconds.  Cat-Cow  Repeat these steps until your lower back becomes more flexible: 1. Get into a hands-and-knees position on a firm surface. Keep your hands under your shoulders, and keep your knees under your hips. You may place padding under your knees for comfort. 2. Let your head hang down, and point your tailbone toward the floor so your lower back  becomes rounded like the back of a cat. 3. Hold this position for 5 seconds. 4. Slowly lift your head and point your tailbone up toward the ceiling so your back forms a sagging arch like the back of a cow. 5. Hold this position for 5 seconds.  Press-Ups  Repeat these steps 5-10 times: 1. Lie on your abdomen (face-down) on the floor. 2. Place your palms near your head, about shoulder-width apart. 3. While you keep your back as relaxed as possible and keep your hips on the floor, slowly straighten your arms to raise the top half of your body and lift your shoulders. Do not use your back muscles to raise your upper torso. You may adjust the placement of your hands to make yourself more comfortable. 4. Hold this position for 5 seconds while you keep your back relaxed. 5. Slowly return to lying flat on the floor.  Bridges  Repeat these steps 10 times: 1. Lie on your back on a firm surface. 2. Bend your knees so they are pointing toward the ceiling and your feet are flat on the floor. 3. Tighten your buttocks muscles and lift your buttocks off of the floor until your waist is at almost the same height as your knees. You should feel the muscles working in your buttocks and the back of your thighs. If you do not feel these muscles, slide your feet 1-2 inches farther away   from your buttocks. 4. Hold this position for 3-5 seconds. 5. Slowly lower your hips to the starting position, and allow your buttocks muscles to relax completely.  If this exercise is too easy, try doing it with your arms crossed over your chest. Abdominal Crunches  Repeat these steps 5-10 times: 1. Lie on your back on a firm bed or the floor with your legs extended. 2. Bend your knees so they are pointing toward the ceiling and your feet are flat on the floor. 3. Cross your arms over your chest. 4. Tip your chin slightly toward your chest without bending your neck. 5. Tighten your abdominal muscles and slowly raise your  trunk (torso) high enough to lift your shoulder blades a tiny bit off of the floor. Avoid raising your torso higher than that, because it can put too much stress on your low back and it does not help to strengthen your abdominal muscles. 6. Slowly return to your starting position.  Back Lifts Repeat these steps 5-10 times: 1. Lie on your abdomen (face-down) with your arms at your sides, and rest your forehead on the floor. 2. Tighten the muscles in your legs and your buttocks. 3. Slowly lift your chest off of the floor while you keep your hips pressed to the floor. Keep the back of your head in line with the curve in your back. Your eyes should be looking at the floor. 4. Hold this position for 3-5 seconds. 5. Slowly return to your starting position.  Contact a health care provider if:  Your back pain or discomfort gets much worse when you do an exercise.  Your back pain or discomfort does not lessen within 2 hours after you exercise. If you have any of these problems, stop doing these exercises right away. Do not do them again unless your health care provider says that you can. Get help right away if:  You develop sudden, severe back pain. If this happens, stop doing the exercises right away. Do not do them again unless your health care provider says that you can. This information is not intended to replace advice given to you by your health care provider. Make sure you discuss any questions you have with your health care provider. Document Released: 12/01/2004 Document Revised: 03/02/2016 Document Reviewed: 12/18/2014 Elsevier Interactive Patient Education  2017 [place] therapy can help ease sore, stiff, injured, and tight muscles and joints. Heat relaxes your muscles, which may help ease your pain. Heat therapy should only be used on old, pre-existing, or long-lasting (chronic) injuries. Do not use heat therapy unless told by your doctor. How to use heat  therapy There are several different kinds of heat therapy, including:  Moist heat pack.  Warm water bath.  Hot water bottle.  Electric heating pad.  Heated gel pack.  Heated wrap.  Electric heating pad.  General heat therapy recommendations  Do not sleep while using heat therapy. Only use heat therapy while you are awake.  Your skin may turn pink while using heat therapy. Do not use heat therapy if your skin turns red.  Do not use heat therapy if you have new pain.  High heat or long exposure to heat can cause burns. Be careful when using heat therapy to avoid burning your skin.  Do not use heat therapy on areas of your skin that are already irritated, such as with a rash or sunburn. Get help if:  You have blisters, redness, swelling (puffiness), or  numbness.  You have new pain.  Your pain is worse. This information is not intended to replace advice given to you by your health care provider. Make sure you discuss any questions you have with your health care provider. Document Released: 01/16/2012 Document Revised: 03/31/2016 Document Reviewed: 12/17/2013 Elsevier Interactive Patient Education  [employer].

## 2018-01-25 ENCOUNTER — Ambulatory Visit (INDEPENDENT_AMBULATORY_CARE_PROVIDER_SITE_OTHER): Payer: Medicare Other | Admitting: Physician Assistant

## 2018-01-25 ENCOUNTER — Encounter: Payer: Self-pay | Admitting: Physician Assistant

## 2018-01-25 VITALS — BP 112/60 | HR 86 | Temp 98.2°F | Wt 186.0 lb

## 2018-01-25 DIAGNOSIS — R3915 Urgency of urination: Secondary | ICD-10-CM | POA: Diagnosis not present

## 2018-01-25 DIAGNOSIS — N3946 Mixed incontinence: Secondary | ICD-10-CM | POA: Insufficient documentation

## 2018-01-25 DIAGNOSIS — N951 Menopausal and female climacteric states: Secondary | ICD-10-CM | POA: Insufficient documentation

## 2018-01-25 LAB — POCT URINALYSIS DIPSTICK
Bilirubin, UA: NEGATIVE
Blood, UA: NEGATIVE
Glucose, UA: NEGATIVE
Ketones, UA: NEGATIVE
Leukocytes, UA: NEGATIVE
Nitrite, UA: NEGATIVE
Protein, UA: NEGATIVE
Spec Grav, UA: <=1.005 — AB (ref 1.010–1.025)
Urobilinogen, UA: 0.2 E.U./dL
pH, UA: 6 (ref 5.0–8.0)

## 2018-01-25 MED ORDER — PHENAZOPYRIDINE HCL 200 MG PO TABS
200.0000 mg | ORAL_TABLET | Freq: Three times a day (TID) | ORAL | 0 refills | Status: AC
Start: 2018-01-25 — End: 2018-01-27

## 2018-01-25 NOTE — Progress Notes (Signed)
HPI:                                                                Janet Richards is a 57 y.o. female who presents to [facility] today for dysuria  Urinary Frequency   This is a new problem. The current episode started in the past 7 days (x 3 days). The problem has been unchanged. The patient is experiencing no pain. There has been no fever. There is no history of pyelonephritis. Associated symptoms include frequency and urgency. Pertinent negatives include no chills, hematuria or nausea. She has tried nothing for the symptoms. There is no history of kidney stones or recurrent UTIs.    Depression screen [facility] 2/9 12/15/2017 03/27/2017  Decreased Interest 0 0  Down, Depressed, Hopeless 0 0  PHQ - 2 Score 0 0  Some recent data might be hidden    No flowsheet data found.    Past Medical History:  Diagnosis Date  . Essential hypertension, benign   . Menstrual migraine   . Periodontitis   . Primary osteoarthritis of both knees   . Urticaria    Past Surgical History:  Procedure Laterality Date  . TUBAL LIGATION  12/26/1987   Social History   Tobacco Use  . Smoking status: Former Smoker    Packs/day: 1.50    Types: Cigarettes    Last attempt to quit: 02/05/2006    Years since quitting: 11.9  . Smokeless tobacco: Never Used  Substance Use Topics  . Alcohol use: Yes    Comment: rarely   family history includes CAD in her father; Hypertension in her mother.    ROS: negative except as noted in the HPI  Medications: Current Outpatient Medications  Medication Sig Dispense Refill  . albuterol (PROAIR HFA) 108 (90 Base) MCG/ACT inhaler INHALE TWO PUFFS EVERY 4-6 HOURS ONLY AS NEEDED FOR SHORTNESS OF BREATH OR WHEEZING. 8.5 Inhaler 0  . Biotin 1000 MCG CHEW Chew by mouth.    . cetirizine HCl (ZYRTEC) 1 MG/ML solution TAKE 10 MLS (10 MG TOTAL) BY MOUTH DAILY. 944 mL 4  . conjugated estrogens (PREMARIN) vaginal cream Place 1  Applicatorful vaginally daily. For first 2 weeks then decrease to 3 times a week. 42.5 g 12  . hydrochlorothiazide (HYDRODIURIL) 25 MG tablet TAKE 1 TABLET BY MOUTH EVERY DAY 90 tablet 0  . ipratropium (ATROVENT) 0.06 % nasal spray Place 2 sprays into both nostrils 4 (four) times daily.    . montelukast (SINGULAIR) 10 MG tablet Take 10 mg by mouth at bedtime.     . pantoprazole (PROTONIX) 40 MG tablet TAKE 1 TABLET (40 MG TOTAL) BY MOUTH DAILY. 90 tablet 1  . phenazopyridine (PYRIDIUM) 200 MG tablet Take 1 tablet (200 mg total) by mouth 3 (three) times daily for 2 days. 6 tablet 0  . Probiotic Product (PROBIOTIC-10) CAPS Take by mouth.     No current facility-administered medications for this visit.    Allergies  Allergen Reactions  . Azithromycin Nausea And Vomiting  . Clindamycin/Lincomycin Hives    Facial swelling  . Metronidazole Hives    Rash, has had fluconazole (Diflucan) w/o reaction  . Sulfa Antibiotics Hives       Objective:  BP  112/60   Pulse 86   Temp 98.2 F (36.8 C) (Oral)   Wt 186 lb (84.4 kg)   LMP 09/14/2017 (Exact Date)   BMI 32.95 kg/m  Gen:  alert, not ill-appearing, no distress, appropriate for age, obese female HEENT: head normocephalic without obvious abnormality, conjunctiva and cornea clear, trachea midline Pulm: Normal work of breathing, normal phonation GI: abdomen soft, nontender, no CVA tenderness Neuro: alert and oriented x 3, no tremor MSK: extremities atraumatic, normal gait and station Skin: intact, no rashes on exposed skin, no jaundice, no cyanosis     Results for orders placed or performed in visit on 01/25/18 (from the past 72 hour(s))  POCT Urinalysis Dipstick     Status: Abnormal   Collection Time: 01/25/18 11:37 AM  Result Value Ref Range   Color, UA light yellow    Clarity, UA clear    Glucose, UA negative    Bilirubin, UA negative    Ketones, UA negative    Spec Grav, UA <=1.005 (A) 1.010 - 1.025   Blood, UA negative     pH, UA 6.0 5.0 - 8.0   Protein, UA negative    Urobilinogen, UA 0.2 0.2 or 1.0 E.U./dL   Nitrite, UA negative    Leukocytes, UA Negative Negative   Appearance     Odor     No results found.    Assessment and Plan: 57 y.o. female with   1. Urinary urgency - POCT Urinalysis Dipstick negative. Urgency and frequency in a perimenopausal female. No fever, chills, nausea, flank pain, hematuria or dysuria. Symptomatic management with Pyridium/Azo as needed - phenazopyridine (PYRIDIUM) 200 MG tablet; Take 1 tablet (200 mg total) by mouth 3 (three) times daily for 2 days.  Dispense: 6 tablet; Refill: 0  2. Perimenopausal   Patient education and anticipatory guidance given Patient agrees with treatment plan Follow-up as needed if symptoms worsen or fail to improve  Darlyne Russian PA-C

## 2018-01-25 NOTE — Addendum Note (Signed)
Addended by: Delrae Alfred on: 01/25/2018 04:52 PM   Modules accepted: Orders

## 2018-01-26 LAB — URINE CULTURE
MICRO NUMBER:: 90357848
Result:: NO GROWTH
SPECIMEN QUALITY:: ADEQUATE

## 2018-01-29 NOTE — Progress Notes (Signed)
Urine culture did not show a bacterial infection If you are still having symptoms, it is okay to finish the antibiotic If you are still symptomatic after the antibiotic, recommend you return for a follow-up exam with your PCP

## 2018-02-01 ENCOUNTER — Telehealth: Payer: Self-pay | Admitting: Physician Assistant

## 2018-02-01 DIAGNOSIS — J329 Chronic sinusitis, unspecified: Secondary | ICD-10-CM

## 2018-02-01 DIAGNOSIS — H6983 Other specified disorders of Eustachian tube, bilateral: Secondary | ICD-10-CM

## 2018-02-01 NOTE — Telephone Encounter (Signed)
Referral placed.

## 2018-02-01 NOTE — Telephone Encounter (Signed)
Pt called. She's ready to move forward in seeing a ENT Specialist.

## 2018-02-01 NOTE — Telephone Encounter (Signed)
Routing to PCP for referral and diagnosis.

## 2018-02-01 NOTE — Telephone Encounter (Signed)
Ok to place ENT referral for recurrent sinusitis and eustachian tube dysfunction bilaterally.

## 2018-02-03 ENCOUNTER — Emergency Department (INDEPENDENT_AMBULATORY_CARE_PROVIDER_SITE_OTHER)
Admission: EM | Admit: 2018-02-03 | Discharge: 2018-02-03 | Disposition: A | Discharge status: Home / Self Care | Payer: Medicare Other | Source: Home / Self Care

## 2018-02-03 ENCOUNTER — Encounter: Payer: Self-pay | Admitting: Emergency Medicine

## 2018-02-03 ENCOUNTER — Emergency Department (INDEPENDENT_AMBULATORY_CARE_PROVIDER_SITE_OTHER): Payer: Medicare Other

## 2018-02-03 DIAGNOSIS — R300 Dysuria: Secondary | ICD-10-CM

## 2018-02-03 DIAGNOSIS — W228XXA Striking against or struck by other objects, initial encounter: Secondary | ICD-10-CM | POA: Diagnosis not present

## 2018-02-03 DIAGNOSIS — S91331A Puncture wound without foreign body, right foot, initial encounter: Secondary | ICD-10-CM | POA: Diagnosis not present

## 2018-02-03 DIAGNOSIS — M79672 Pain in left foot: Secondary | ICD-10-CM

## 2018-02-03 LAB — POCT URINALYSIS DIP (MANUAL ENTRY)
Bilirubin, UA: NEGATIVE
Blood, UA: NEGATIVE
Glucose, UA: NEGATIVE mg/dL
Ketones, POC UA: NEGATIVE mg/dL
Leukocytes, UA: NEGATIVE
Nitrite, UA: NEGATIVE
Protein Ur, POC: NEGATIVE mg/dL
Spec Grav, UA: 1.01 (ref 1.010–1.025)
Urobilinogen, UA: 0.2 E.U./dL
pH, UA: 5.5 (ref 5.0–8.0)

## 2018-02-03 MED ORDER — NITROFURANTOIN MONOHYD MACRO 100 MG PO CAPS: 100.0000 mg | ORAL_CAPSULE | Freq: Two times a day (BID) | ORAL | 0 refills | Status: DC

## 2018-02-03 NOTE — ED Triage Notes (Signed)
Pt c/o urinary frequency x1 week. States she "took some leftover clindamycin she has at home"/

## 2018-02-03 NOTE — ED Provider Notes (Signed)
[facility]    CSN: [id number] Arrival date & time: 02/03/18  1430     History   Chief Complaint Chief Complaint  Patient presents with  . Urinary Frequency    HPI [name] is a 57 y.o. female.   The history is provided by the patient. No language interpreter was used.  Urinary Frequency  This is a new problem. The current episode started more than 1 week ago. The problem occurs constantly. The problem has been gradually worsening. Associated symptoms include abdominal pain. Nothing aggravates the symptoms. Nothing relieves the symptoms. She has tried nothing for the symptoms. The treatment provided no relief.  Pt reports she began having urinary frequency 1 week ago.  Pt started taking clindamycin yesterday.  Pt reports she is still having some lower abdominal discomfort and bloating./  Pt also concerned because she stepped on something and has a sore area on her foot   Past Medical History:  Diagnosis Date  . Essential hypertension, benign   . Menstrual migraine   . Periodontitis   . Primary osteoarthritis of both knees   . Urticaria     Patient Active Problem List   Diagnosis Date Noted  . Perimenopausal 01/25/2018  . Urinary urgency 01/25/2018  . Periodontitis 12/15/2017  . Dysfunction of both eustachian tubes 11/26/2017  . Recurrent sinusitis 11/26/2017  . Hypertriglyceridemia 09/21/2017  . Vaginal dryness, menopausal 03/01/2017  . Neuropathy 03/01/2017  . Abnormal ankle brachial index (ABI) 11/09/2016  . Burning sensation of foot 09/02/2016  . Ganglion cyst of right foot 07/12/2016  . Chronic dental infection 04/11/2016  . Anemia, iron deficiency 02/25/2016  . Right ankle pain 02/24/2016  . Fatigue 02/24/2016  . Paresthesias 07/01/2015  . History of colonic polyps 03/20/2015  . Hyperlipidemia 02/20/2015  . Chronic allergic rhinitis 01/14/2015  . IFG (impaired fasting glucose) 10/17/2014  . Osteoarthritis of first metatarsophalangeal  joint 04/11/2014  . 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 No blood products 03/28/2013  . Essential hypertension, benign 03/01/2013  . Exertional chest pain 02/05/2013  . Abnormal uterine bleeding 02/05/2013  . Primary osteoarthritis of both knees 12/27/2012    Past Surgical History:  Procedure Laterality Date  . TUBAL LIGATION  12/26/1987    OB History    Gravida  5   Para  3   Term  3   Preterm      AB  2   Living  3     SAB      TAB  2   Ectopic      Multiple      Live Births               Home Medications    Prior to Admission medications   Medication Sig Start Date End Date Taking? Authorizing Provider  albuterol (PROAIR HFA) 108 (90 Base) MCG/ACT inhaler INHALE TWO PUFFS EVERY 4-6 HOURS ONLY AS NEEDED FOR SHORTNESS OF BREATH OR WHEEZING. 10/03/17   Breeback, Jade L, PA-C  Biotin 1000 MCG CHEW Chew by mouth.    [provider]  cetirizine HCl (ZYRTEC) 1 MG/ML solution TAKE 10 MLS (10 MG TOTAL) BY MOUTH DAILY. 03/20/17   Breeback, Luvenia Starch L, PA-C  conjugated estrogens (PREMARIN) vaginal cream Place 1 Applicatorful vaginally daily. For first 2 weeks then decrease to 3 times a week. 03/01/17   Breeback, Jade L, PA-C  hydrochlorothiazide (HYDRODIURIL) 25 MG tablet TAKE 1 TABLET BY MOUTH EVERY DAY 01/19/18   [name], PA-C  ipratropium (ATROVENT) 0.06 % nasal spray Place 2 sprays into both nostrils 4 (four) times daily.    [provider]  montelukast (SINGULAIR) 10 MG tablet Take 10 mg by mouth at bedtime.  04/04/13   [provider]  nitrofurantoin, macrocrystal-monohydrate, (MACROBID) 100 MG capsule Take 1 capsule (100 mg total) by mouth 2 (two) times daily. 02/03/18   Fransico Meadow, PA-C  pantoprazole (PROTONIX) 40 MG tablet TAKE 1 TABLET (40 MG TOTAL) BY MOUTH DAILY. 10/17/17   [name], PA-C  Probiotic Product (PROBIOTIC-10) CAPS Take by mouth.    [provider]    Family History Family History  Problem  Relation Age of Onset  . Hypertension Mother   . CAD Father        MI in his 9s    Social History Social History   Tobacco Use  . Smoking status: Former Smoker    Packs/day: 1.50    Types: Cigarettes    Last attempt to quit: 02/05/2006    Years since quitting: 12.0  . Smokeless tobacco: Never Used  Substance Use Topics  . Alcohol use: Yes    Comment: rarely  . Drug use: No     Allergies   Azithromycin; Clindamycin/lincomycin; Metronidazole; and Sulfa antibiotics   Review of Systems Review of Systems  Gastrointestinal: Positive for abdominal pain.  Genitourinary: Positive for frequency.  All other systems reviewed and are negative.    Physical Exam Triage Vital Signs ED Triage Vitals [02/03/18 1502]  Enc Vitals Group     BP 104/67     Pulse Rate 86     Resp      Temp 98.6 F (37 C)     Temp Source Oral     SpO2 98 %     Weight 183 lb (83 kg)     Height      Head Circumference      Peak Flow      Pain Score 0     Pain Loc      Pain Edu?      Excl. in [place]?    No data found.  Updated Vital Signs BP 104/67 (BP Location: Right Arm)   Pulse 86   Temp 98.6 F (37 C) (Oral)   Wt 183 lb (83 kg)   SpO2 98%   BMI 32.42 kg/m   Visual Acuity Right Eye Distance:   Left Eye Distance:   Bilateral Distance:    Right Eye Near:   Left Eye Near:    Bilateral Near:     Physical Exam  Constitutional: She appears well-developed and well-nourished. No distress.  HENT:  Head: Normocephalic and atraumatic.  Eyes: Conjunctivae are normal.  Neck: Neck supple.  Foot no sign of injury  Cardiovascular: Normal rate and regular rhythm.  No murmur heard. Pulmonary/Chest: Effort normal and breath sounds normal. No respiratory distress.  Abdominal: Soft. There is no tenderness.  Musculoskeletal: She exhibits no edema.  Neurological: She is alert.  Skin: Skin is warm and dry.  Psychiatric: She has a normal mood and affect.  Nursing note and vitals  reviewed.    UC Treatments / Results  Labs (all labs ordered are listed, but only abnormal results are displayed) Labs Reviewed  POCT URINALYSIS DIP (MANUAL ENTRY)    EKG None Radiology Dg Foot Complete Right  Result Date: 02/03/2018 CLINICAL DATA:  Stepped on sharp object in yard 5 days ago. EXAM: RIGHT FOOT COMPLETE - 3+ VIEW COMPARISON:  02/24/2016.  FINDINGS: There is no evidence of fracture or dislocation. There is no evidence of arthropathy or other focal bone abnormality. Soft tissues are unremarkable. IMPRESSION: Negative. Electronically Signed   By: Kerby Moors M.D.   On: 02/03/2018 15:43    Procedures Procedures (including critical care time)  Medications Ordered in UC Medications - No data to display   Initial Impression / Assessment and Plan / UC Course  I have reviewed the triage vital signs and the nursing notes.  Pertinent labs & imaging results that were available during my care of the patient were reviewed by me and considered in my medical decision making (see chart for details).     MDM  Xray negative no foreign body,  Urine is normal.   I am going to stop clindamycin.  I will cover uti symptoms with macrobid.  Pt advised to tak e a probiotic.   Final Clinical Impressions(s) / UC Diagnoses   Final diagnoses:  Dysuria  Foot pain, left    ED Discharge Orders        Ordered    nitrofurantoin, macrocrystal-monohydrate, (MACROBID) 100 MG capsule  2 times daily     02/03/18 1548    An After Visit Summary was printed and given to the patient.    Controlled Substance Prescriptions [place] Controlled Substance Registry consulted? Not Applicable   Fransico Meadow, [place] 02/03/18 2725

## 2018-02-03 NOTE — Discharge Instructions (Signed)
Stop clindamycin.  Take macrobid.   Make sure you take a probiotic as directed

## 2018-02-05 ENCOUNTER — Telehealth: Payer: Self-pay | Admitting: *Deleted

## 2018-02-05 NOTE — Telephone Encounter (Signed)
Callback: Patient reports she is improving. Encouraged to finish antibiotic course.

## 2018-02-21 ENCOUNTER — Encounter: Payer: Self-pay | Admitting: Physician Assistant

## 2018-02-21 ENCOUNTER — Ambulatory Visit (INDEPENDENT_AMBULATORY_CARE_PROVIDER_SITE_OTHER): Payer: Medicare Other | Admitting: Physician Assistant

## 2018-02-21 ENCOUNTER — Ambulatory Visit (INDEPENDENT_AMBULATORY_CARE_PROVIDER_SITE_OTHER): Payer: Medicare Other

## 2018-02-21 VITALS — BP 142/72 | HR 98 | Ht 63.0 in | Wt 185.0 lb

## 2018-02-21 DIAGNOSIS — M542 Cervicalgia: Secondary | ICD-10-CM

## 2018-02-21 DIAGNOSIS — M5031 Other cervical disc degeneration,  high cervical region: Secondary | ICD-10-CM | POA: Diagnosis not present

## 2018-02-21 DIAGNOSIS — R200 Anesthesia of skin: Secondary | ICD-10-CM

## 2018-02-21 DIAGNOSIS — R202 Paresthesia of skin: Secondary | ICD-10-CM
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
## 2018-02-21 NOTE — Patient Instructions (Signed)
Will get EMG's and xray of neck.

## 2018-02-22 ENCOUNTER — Other Ambulatory Visit: Payer: Self-pay | Admitting: Physician Assistant

## 2018-02-22 ENCOUNTER — Encounter: Payer: Self-pay | Admitting: Physician Assistant

## 2018-02-22 DIAGNOSIS — M5030 Other cervical disc degeneration, unspecified cervical region: Secondary | ICD-10-CM

## 2018-02-22 DIAGNOSIS — M542 Cervicalgia: Secondary | ICD-10-CM

## 2018-02-22 HISTORY — DX: Other cervical disc degeneration, unspecified cervical region: M50.30

## 2018-02-22 LAB — COMPLETE METABOLIC PANEL WITH GFR
AG Ratio: 1.8 (calc) (ref 1.0–2.5)
ALT: 19 U/L (ref 6–29)
AST: 14 U/L (ref 10–35)
Albumin: 4.4 g/dL (ref 3.6–5.1)
Alkaline phosphatase (APISO): 67 U/L (ref 33–130)
BUN: 16 mg/dL (ref 7–25)
CO2: 32 mmol/L (ref 20–32)
Calcium: 9.7 mg/dL (ref 8.6–10.4)
Chloride: 103 mmol/L (ref 98–110)
Creat: 0.94 mg/dL (ref 0.50–1.05)
GFR, Est African American: 80 mL/min/{1.73_m2} (ref >=60)
GFR, Est Non African American: 69 mL/min/{1.73_m2} (ref >=60)
Globulin: 2.4 g/dL (calc) (ref 1.9–3.7)
Glucose, Bld: 86 mg/dL (ref 65–99)
Potassium: 4.2 mmol/L (ref 3.5–5.3)
Sodium: 140 mmol/L (ref 135–146)
Total Bilirubin: 0.5 mg/dL (ref 0.2–1.2)
Total Protein: 6.8 g/dL (ref 6.1–8.1)

## 2018-02-22 LAB — CBC
HCT: 36.9 % (ref 35.0–45.0)
Hemoglobin: 12.5 g/dL (ref 11.7–15.5)
MCH: 29.6 pg (ref 27.0–33.0)
MCHC: 33.9 g/dL (ref 32.0–36.0)
MCV: 87.4 fL (ref 80.0–100.0)
MPV: 10.4 fL (ref 7.5–12.5)
Platelets: 369 10*3/uL (ref 140–400)
RBC: 4.22 10*6/uL (ref 3.80–5.10)
RDW: 13.3 % (ref 11.0–15.0)
WBC: 6.3 10*3/uL (ref 3.8–10.8)

## 2018-02-22 LAB — B12 AND FOLATE PANEL
Folate: 17 ng/mL
Vitamin B-12: 428 pg/mL (ref 200–1100)

## 2018-02-22 LAB — TSH: TSH: 2.16 mIU/L

## 2018-02-22 LAB — IRON,TIBC AND FERRITIN PANEL
%SAT: 22 % (calc) (ref 11–50)
Ferritin: 50 ng/mL (ref 10–232)
Iron: 70 ug/dL (ref 45–160)
TIBC: 312 mcg/dL (calc) (ref 250–450)

## 2018-02-22 NOTE — Progress Notes (Signed)
Call pt: thyroid looks great. Iron looks good. Kidney, liver, glucose look good. No cause found in labs for symptoms.

## 2018-02-22 NOTE — Progress Notes (Signed)
Call pt: there is some mild spurring(arthritis) in xray. There is some mild narrowing of disc as well. Lets try PT for 4 weeks and see if any improvement. If not will get MRI.

## 2018-02-22 NOTE — Progress Notes (Unsigned)
amb  

## 2018-02-25 ENCOUNTER — Telehealth: Payer: Self-pay | Admitting: Physician Assistant

## 2018-02-25 NOTE — Telephone Encounter (Signed)
EMG ordered. I cannot print order thanks.

## 2018-03-13 ENCOUNTER — Encounter: Payer: Self-pay | Admitting: Physician Assistant

## 2018-03-13 ENCOUNTER — Ambulatory Visit (INDEPENDENT_AMBULATORY_CARE_PROVIDER_SITE_OTHER): Payer: Medicare Other | Admitting: Physician Assistant

## 2018-03-13 VITALS — BP 125/62 | HR 93 | Ht 63.0 in | Wt 187.0 lb

## 2018-03-13 DIAGNOSIS — Z636 Dependent relative needing care at home: Secondary | ICD-10-CM | POA: Diagnosis not present

## 2018-03-13 DIAGNOSIS — Z0184 Encounter for antibody response examination: Secondary | ICD-10-CM

## 2018-03-13 DIAGNOSIS — H6983 Other specified disorders of Eustachian tube, bilateral: Secondary | ICD-10-CM | POA: Diagnosis not present

## 2018-03-13 DIAGNOSIS — J0140 Acute pansinusitis, unspecified: Secondary | ICD-10-CM | POA: Diagnosis not present

## 2018-03-13 DIAGNOSIS — H6993 Unspecified Eustachian tube disorder, bilateral: Secondary | ICD-10-CM

## 2018-03-13 MED ORDER — AMOXICILLIN-POT CLAVULANATE 875-125 MG PO TABS: 1.0000 | ORAL_TABLET | Freq: Two times a day (BID) | ORAL | 0 refills | Status: DC

## 2018-03-13 NOTE — Patient Instructions (Signed)

## 2018-03-13 NOTE — Progress Notes (Signed)
q 

## 2018-03-14 LAB — MEASLES/MUMPS/RUBELLA IMMUNITY
Mumps IgG: 44.3 AU/mL
Rubella: 16.8 index
Rubeola IgG: 32.9 AU/mL

## 2018-03-14 NOTE — Progress Notes (Signed)
Call pt: you are immune to Measles no need for booster.

## 2018-03-15 NOTE — Telephone Encounter (Signed)
That medication does not treat upper respiratory infection/sinusitis it is an antibiotic for urinary tract infections. She needs to give it at least another day or so to confirm not working. Also if it is more her ear pain we could add a steroid for eustachian tube dysfunction.

## 2018-03-16 ENCOUNTER — Telehealth: Payer: Self-pay

## 2018-03-16 DIAGNOSIS — Z636 Dependent relative needing care at home: Secondary | ICD-10-CM | POA: Insufficient documentation

## 2018-03-16 MED ORDER — FLUCONAZOLE 150 MG PO TABS: 150.0000 mg | ORAL_TABLET | Freq: Once | ORAL | 0 refills | Status: AC

## 2018-03-16 NOTE — Telephone Encounter (Signed)
Pt called and wanted to let Luvenia Starch know that the medication are working for her, she just needs RX sent to pharmacy for Fluconazole.

## 2018-03-16 NOTE — Progress Notes (Signed)
Subjective:    Patient ID: Janet Richards, female    DOB: [birth year], 57 y.o.   MRN: [id number]  HPI Patient is a 57 year old female who presents to the clinic with concern for sinus infection.  She has a history of infections.  She recently helped a friend clean out and all dusty house.  She noticed after that she felt more congested and her allergies were worse.  She treated this with over-the-counter antihistamines and nasal sprays.  She continues to worsen.  She has a lot of pressure in her nasal areas, maxillary sinuses and ears.  Her right ear is particularly intermittently painful.  She denies any ear discharge.  She denies any fever, chills, shortness of breath or wheezing.  She does mention a lot of caregiver stress.  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  He can be inappropriate with women therefore she has to watch him in public constantly.  She is finding herself really exhausted.  She is interested in some type of day program where she can get a few hours break.  Overall she is sleeping well and feels very supported by her community.  In lieu of the measles outbreak she would like to confirm immunity.  .. Active Ambulatory Problems    Diagnosis Date Noted  . Primary osteoarthritis of both knees 12/27/2012  . Exertional chest pain 02/05/2013  . Abnormal uterine bleeding 02/05/2013  . Essential hypertension, benign 03/01/2013  . No blood products 03/28/2013  . 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 Osteoarthritis of first metatarsophalangeal joint 04/11/2014  . IFG (impaired fasting glucose) 10/17/2014  . Chronic allergic rhinitis 01/14/2015  . Hyperlipidemia 02/20/2015  . History of colonic polyps 03/20/2015  . Paresthesias 07/01/2015  . Right ankle pain 02/24/2016  . Fatigue 02/24/2016  . Anemia, iron deficiency 02/25/2016  . Chronic dental infection 04/11/2016  . Ganglion cyst of right foot 07/12/2016  . Burning sensation of foot  09/02/2016  . Abnormal ankle brachial index (ABI) 11/09/2016  . Vaginal dryness, menopausal 03/01/2017  . Neuropathy 03/01/2017  . Hypertriglyceridemia 09/21/2017  . Dysfunction of both eustachian tubes 11/26/2017  . Recurrent sinusitis 11/26/2017  . Periodontitis 12/15/2017  . Perimenopausal 01/25/2018  . Urinary urgency 01/25/2018  . DDD (degenerative disc disease), cervical 02/22/2018  . Caregiver stress 03/16/2018   Resolved Ambulatory Problems    Diagnosis Date Noted  . Allergic urticaria 12/27/2012  . Dental abscess 02/05/2013  . Menstrual migraine 02/05/2013  . Right lumbar radiculitis 04/19/2013  . Hyperglycemia 08/05/2013  . Allergic rhinitis 01/22/2014  . Left knee pain 02/25/2014  . Dental infection 03/20/2014  . Allergic sinusitis 01/14/2015  . Pain, dental 01/14/2015  . Sinusitis 06/26/2015  . Chest pain 07/08/2015  . Right knee injury 07/15/2015  . Neck pain 09/01/2015  . UTI (urinary tract infection) 11/25/2015  . Acute upper respiratory infection 12/22/2015  . Low blood potassium 05/24/2016  . Elevated lipase 05/24/2016  . Insect bite 05/25/2016   Past Medical History:  Diagnosis Date  . Essential hypertension, benign   . Menstrual migraine   . Periodontitis   . Primary osteoarthritis of both knees   . Urticaria       Review of Systems  All other systems reviewed and are negative.  See HPI.     Objective:   Physical Exam  Constitutional: She is oriented to person, place, and time. She appears well-developed and well-nourished.  HENT:  Head: Normocephalic and atraumatic.  Right Ear: External ear normal.  Left Ear: External ear normal.  TM"s clear bilaterally.  Tenderness over maxillary and frontal sinuses.  Nasal turbinates red and swollen.  Oropharynx erythematous with PND.   Eyes: Pupils are equal, round, and reactive to light. Conjunctivae and EOM are normal.  Neck: Normal range of motion. Neck supple.  Cardiovascular: Normal rate and  regular rhythm.  Pulmonary/Chest: Effort normal and breath sounds normal. She has no wheezes.  Lymphadenopathy:    She has no cervical adenopathy.  Neurological: She is alert and oriented to person, place, and time.  Psychiatric: She has a normal mood and affect. Her behavior is normal.          Assessment & Plan:  Marland KitchenMarland KitchenDiagnoses and all orders for this visit:  Acute non-recurrent pansinusitis -     amoxicillin-clavulanate (AUGMENTIN) 875-125 MG tablet; Take 1 tablet by mouth 2 (two) times daily.  Immunity status testing -     Measles/Mumps/Rubella Immunity  ETD (Eustachian tube dysfunction), bilateral  Caregiver stress  Patient likely has an allergic induced sinusitis.  Will give Augmentin.  Handout of other symptomatic care discussed.  Patient may want to consider wearing a mask next time she cleans and/or taking a antihistamine prophylactically.  Consider continuing nasal spray such as Flonase.  We may need to add a prednisone for the eustachian tube dysfunction if not improving.  Will order labs for immunity testing for MMR.  Discussed caregiver stress.  I think this would be a great option.  I will ask 1 of the nurses to look for resources for patient.

## 2018-03-16 NOTE — Telephone Encounter (Signed)
Sent. Glad she is feeling better.

## 2018-03-16 NOTE — Telephone Encounter (Signed)
Can we look into some reasources for patient she has a mentally retarded son with [employer] and she needs a break during the day. Are there any daycares/programs for adult special needs patient. He is also a patient of ours but I don't have name and MRN.

## 2018-03-19 ENCOUNTER — Encounter: Payer: Self-pay | Admitting: Physician Assistant

## 2018-03-19 ENCOUNTER — Ambulatory Visit (INDEPENDENT_AMBULATORY_CARE_PROVIDER_SITE_OTHER): Payer: Medicare Other | Admitting: Physician Assistant

## 2018-03-19 VITALS — BP 148/71 | HR 101 | Temp 99.9°F | Ht 62.99 in | Wt 184.0 lb

## 2018-03-19 DIAGNOSIS — R05 Cough: Secondary | ICD-10-CM

## 2018-03-19 DIAGNOSIS — B379 Candidiasis, unspecified: Secondary | ICD-10-CM | POA: Diagnosis not present

## 2018-03-19 DIAGNOSIS — R0981 Nasal congestion: Secondary | ICD-10-CM | POA: Diagnosis not present

## 2018-03-19 DIAGNOSIS — J309 Allergic rhinitis, unspecified: Secondary | ICD-10-CM

## 2018-03-19 DIAGNOSIS — R059 Cough, unspecified: Secondary | ICD-10-CM

## 2018-03-19 MED ORDER — HYDROCODONE-HOMATROPINE 5-1.5 MG/5ML PO SYRP: 5.0000 mL | ORAL_SOLUTION | Freq: Every evening | ORAL | 0 refills | Status: DC | PRN

## 2018-03-19 MED ORDER — BENZONATATE 200 MG PO CAPS: 200.0000 mg | ORAL_CAPSULE | Freq: Two times a day (BID) | ORAL | 0 refills | Status: DC | PRN

## 2018-03-19 MED ORDER — PREDNISONE 20 MG PO TABS: ORAL_TABLET | ORAL | 0 refills | Status: DC

## 2018-03-19 MED ORDER — FLUCONAZOLE 150 MG PO TABS: 150.0000 mg | ORAL_TABLET | Freq: Once | ORAL | 0 refills | Status: AC

## 2018-03-19 MED ORDER — METHYLPREDNISOLONE SODIUM SUCC 125 MG IJ SOLR
125.0000 mg | Freq: Once | INTRAMUSCULAR | Status: AC
  Administered 2018-03-19: 125 mg via INTRAMUSCULAR

## 2018-03-21 ENCOUNTER — Encounter: Payer: Self-pay | Admitting: Physician Assistant

## 2018-03-21 ENCOUNTER — Ambulatory Visit (INDEPENDENT_AMBULATORY_CARE_PROVIDER_SITE_OTHER): Payer: Medicare Other | Admitting: Physician Assistant

## 2018-03-21 VITALS — BP 142/70 | HR 87

## 2018-03-21 DIAGNOSIS — R200 Anesthesia of skin: Secondary | ICD-10-CM

## 2018-03-21 DIAGNOSIS — R600 Localized edema: Secondary | ICD-10-CM

## 2018-03-21 DIAGNOSIS — R202 Paresthesia of skin: Secondary | ICD-10-CM

## 2018-03-21 DIAGNOSIS — J040 Acute laryngitis: Secondary | ICD-10-CM | POA: Diagnosis not present

## 2018-03-21 NOTE — Progress Notes (Signed)
a 

## 2018-03-21 NOTE — Patient Instructions (Addendum)
[facility]. Address: [address] [place], [place] [address]  Hours:  Open ? Closes 6PM                          Phone: [phone]  Will get EMG of lower leg.   Peripheral Edema Peripheral edema is swelling that is caused by a buildup of fluid. Peripheral edema most often affects the lower legs, ankles, and feet. It can also develop in the arms, hands, and face. The area of the body that has peripheral edema will look swollen. It may also feel heavy or warm. Your clothes may start to feel tight. Pressing on the area may make a temporary dent in your skin. You may not be able to move your arm or leg as much as usual. There are many causes of peripheral edema. It can be a complication of other diseases, such as congestive heart failure, kidney disease, or a problem with your blood circulation. It also can be a side effect of certain medicines. It often happens to women during pregnancy. Sometimes, the cause is not known. Treating the underlying condition is often the only treatment for peripheral edema. Follow these instructions at home: Pay attention to any changes in your symptoms. Take these actions to help with your discomfort:  Raise (elevate) your legs while you are sitting or lying down.  Move around often to prevent stiffness and to lessen swelling. Do not sit or stand for long periods of time.  Wear support stockings as told by your health care provider.  Follow instructions from your health care provider about limiting salt (sodium) in your diet. Sometimes eating less salt can reduce swelling.  Take over-the-counter and prescription medicines only as told by your health care provider. Your health care provider may prescribe medicine to help your body get rid of excess water (diuretic).  Keep all follow-up visits as told by your health care provider. This is important.  Contact a health care provider if:  You have a fever.  Your edema starts suddenly or is  getting worse, especially if you are pregnant or have a medical condition.  You have swelling in only one leg.  You have increased swelling and pain in your legs. Get help right away if:  You develop shortness of breath, especially when you are lying down.  You have pain in your chest or abdomen.  You feel weak.  You faint. This information is not intended to replace advice given to you by your health care provider. Make sure you discuss any questions you have with your health care provider. Document Released: 12/01/2004 Document Revised: 03/28/2016 Document Reviewed: 05/06/2015 Elsevier Interactive Patient Education  [employer].

## 2018-03-21 NOTE — Progress Notes (Signed)
Subjective:    Patient ID: Janet Richards, female    DOB: [birth year], 57 y.o.   MRN: [id number]  HPI Patient is a 57 year old female who presents to the clinic with worsening nasal congestion, body aches, sinus pressure and fatigue.  She was seen last week for sinusitis and started on Augmentin.  She initially felt much better and then began to feel much worse.  Her husband and son have similar symptoms.  She has had a low-grade fever.  She is taking Mucinex and Sudafed over-the-counter.  She has a dry cough and at night it much worse. She does admit she has been helping a friend clean out a dirty dusty home.  .. Active Ambulatory Problems    Diagnosis Date Noted  . Primary osteoarthritis of both knees 12/27/2012  . Exertional chest pain 02/05/2013  . Abnormal uterine bleeding 02/05/2013  . Essential hypertension, benign 03/01/2013  . No blood products 03/28/2013  . 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 Osteoarthritis of first metatarsophalangeal joint 04/11/2014  . IFG (impaired fasting glucose) 10/17/2014  . Chronic allergic rhinitis 01/14/2015  . Hyperlipidemia 02/20/2015  . History of colonic polyps 03/20/2015  . Paresthesias 07/01/2015  . Right ankle pain 02/24/2016  . Fatigue 02/24/2016  . Anemia, iron deficiency 02/25/2016  . Chronic dental infection 04/11/2016  . Ganglion cyst of right foot 07/12/2016  . Burning sensation of foot 09/02/2016  . Abnormal ankle brachial index (ABI) 11/09/2016  . Vaginal dryness, menopausal 03/01/2017  . Neuropathy 03/01/2017  . Hypertriglyceridemia 09/21/2017  . Dysfunction of both eustachian tubes 11/26/2017  . Recurrent sinusitis 11/26/2017  . Periodontitis 12/15/2017  . Perimenopausal 01/25/2018  . Urinary urgency 01/25/2018  . DDD (degenerative disc disease), cervical 02/22/2018  . Caregiver stress 03/16/2018   Resolved Ambulatory Problems    Diagnosis Date Noted  . Allergic urticaria 12/27/2012  . Dental abscess 02/05/2013  .  Menstrual migraine 02/05/2013  . Right lumbar radiculitis 04/19/2013  . Hyperglycemia 08/05/2013  . Allergic rhinitis 01/22/2014  . Left knee pain 02/25/2014  . Dental infection 03/20/2014  . Allergic sinusitis 01/14/2015  . Pain, dental 01/14/2015  . Sinusitis 06/26/2015  . Chest pain 07/08/2015  . Right knee injury 07/15/2015  . Neck pain 09/01/2015  . UTI (urinary tract infection) 11/25/2015  . Acute upper respiratory infection 12/22/2015  . Low blood potassium 05/24/2016  . Elevated lipase 05/24/2016  . Insect bite 05/25/2016   Past Medical History:  Diagnosis Date  . Essential hypertension, benign   . Menstrual migraine   . Periodontitis   . Primary osteoarthritis of both knees   . Urticaria        Review of Systems  All other systems reviewed and are negative.      Objective:   Physical Exam  Constitutional: She is oriented to person, place, and time. She appears well-developed and well-nourished.  HENT:  Head: Normocephalic and atraumatic.  Right Ear: Hearing, tympanic membrane and ear canal normal.  Left Ear: Hearing, tympanic membrane and ear canal normal.  Mouth/Throat: Uvula is midline, oropharynx is clear and moist and mucous membranes are normal. Uvula swelling present. No oropharyngeal exudate or tonsillar abscesses.  Neck: Normal range of motion. Neck supple.  Cardiovascular: Normal rate and normal heart sounds.  Pulmonary/Chest: Effort normal and breath sounds normal.  Lymphadenopathy:    She has no cervical adenopathy.  Neurological: She is alert and oriented to person, place, and time.  Skin: Skin is warm.  Psychiatric: She has a normal mood  and affect. Her behavior is normal.          Assessment & Plan:  Marland KitchenMarland KitchenKasandra was seen today for sore throat and generalized body aches.  Diagnoses and all orders for this visit:  Allergic sinusitis -     predniSONE (DELTASONE) 20 MG tablet; Take one tablet twice a day for 5 days. -      methylPREDNISolone sodium succinate (SOLU-MEDROL) 125 mg/2 mL injection 125 mg  Cough -     HYDROcodone-homatropine (HYCODAN) 5-1.5 MG/5ML syrup; Take 5 mLs by mouth at bedtime as needed. -     predniSONE (DELTASONE) 20 MG tablet; Take one tablet twice a day for 5 days. -     DG Chest 2 View -     benzonatate (TESSALON) 200 MG capsule; Take 1 capsule (200 mg total) by mouth 2 (two) times daily as needed for cough.  Nasal congestion -     predniSONE (DELTASONE) 20 MG tablet; Take one tablet twice a day for 5 days.  Yeast infection -     fluconazole (DIFLUCAN) 150 MG tablet; Take 1 tablet (150 mg total) by mouth once for 1 dose. Repeat in 48-72hrs   HO given. I suspect could be more of an allergy component or even viral since her whole household is effected. folllow up as needed. Finish augmentin. Rest and hydrate. Consider flonase and nasal sinus rinses.

## 2018-03-21 NOTE — Telephone Encounter (Signed)
Pt advised to contact [facility] for their adult care services. No further questions.

## 2018-03-22 ENCOUNTER — Encounter: Payer: Self-pay | Admitting: Physician Assistant

## 2018-03-22 NOTE — Telephone Encounter (Signed)
Please print EMG's and have ordered. Thanks.

## 2018-03-22 NOTE — Progress Notes (Signed)
Subjective:    Patient ID: Janet Richards, female    DOB: Apr 25, 1961, 57 y.o.   MRN: [id number]  HPI Pt is a 57 yo female who presents to the clinic concerned with bilateral feet swelling. Yesterday both feet were swollen per patient. Denies any injury. She admits she forgot her diuretic. She has been on her feet a lot lately. She continues to have numbness and tingling for years in her feet that has never been evaluated.   She does not have a voice. She continues to take augmentin and steriod for allergic sinusitis.   .. Active Ambulatory Problems    Diagnosis Date Noted  . Primary osteoarthritis of both knees 12/27/2012  . Exertional chest pain 02/05/2013  . Abnormal uterine bleeding 02/05/2013  . Essential hypertension, benign 03/01/2013  . No blood products 03/28/2013  . 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 Osteoarthritis of first metatarsophalangeal joint 04/11/2014  . IFG (impaired fasting glucose) 10/17/2014  . Chronic allergic rhinitis 01/14/2015  . Hyperlipidemia 02/20/2015  . History of colonic polyps 03/20/2015  . Paresthesias 07/01/2015  . Right ankle pain 02/24/2016  . Fatigue 02/24/2016  . Anemia, iron deficiency 02/25/2016  . Chronic dental infection 04/11/2016  . Ganglion cyst of right foot 07/12/2016  . Burning sensation of foot 09/02/2016  . Abnormal ankle brachial index (ABI) 11/09/2016  . Vaginal dryness, menopausal 03/01/2017  . Neuropathy 03/01/2017  . Hypertriglyceridemia 09/21/2017  . Dysfunction of both eustachian tubes 11/26/2017  . Recurrent sinusitis 11/26/2017  . Periodontitis 12/15/2017  . Perimenopausal 01/25/2018  . Urinary urgency 01/25/2018  . DDD (degenerative disc disease), cervical 02/22/2018  . Caregiver stress 03/16/2018   Resolved Ambulatory Problems    Diagnosis Date Noted  . Allergic urticaria 12/27/2012  . Dental abscess 02/05/2013  . Menstrual migraine 02/05/2013  . Right lumbar radiculitis 04/19/2013  . Hyperglycemia  08/05/2013  . Allergic rhinitis 01/22/2014  . Left knee pain 02/25/2014  . Dental infection 03/20/2014  . Allergic sinusitis 01/14/2015  . Pain, dental 01/14/2015  . Sinusitis 06/26/2015  . Chest pain 07/08/2015  . Right knee injury 07/15/2015  . Neck pain 09/01/2015  . UTI (urinary tract infection) 11/25/2015  . Acute upper respiratory infection 12/22/2015  . Low blood potassium 05/24/2016  . Elevated lipase 05/24/2016  . Insect bite 05/25/2016   Past Medical History:  Diagnosis Date  . Essential hypertension, benign   . Menstrual migraine   . Periodontitis   . Primary osteoarthritis of both knees   . Urticaria       Review of Systems  All other systems reviewed and are negative.      Objective:   Physical Exam  Constitutional: She is oriented to person, place, and time. She appears well-developed and well-nourished.  HENT:  Head: Normocephalic and atraumatic.  Right Ear: External ear normal.  Left Ear: External ear normal.  Eyes: Pupils are equal, round, and reactive to light. Conjunctivae and EOM are normal.  Neck: Normal range of motion. Neck supple.  Cardiovascular: Normal rate, regular rhythm and normal heart sounds.  No murmur heard. Pulmonary/Chest: Effort normal and breath sounds normal.  Lymphadenopathy:    She has no cervical adenopathy.  Neurological: She is alert and oriented to person, place, and time.  Skin: Capillary refill takes less than 2 seconds.  No edema present today.  Pedal pusles intact 2+  Psychiatric: She has a normal mood and affect. Her behavior is normal.          Assessment &  Plan:  ..Diagnoses and all orders for this visit:  Laryngitis  Bilateral lower extremity edema  Numbness and tingling of both feet -     NCV with EMG(electromyography)  discussed symptomatic care for laryngitis. HO given. On steroid now. Consider voice rest and time.   I did not see any edema in feet today. Discussed causes of edema. likelyher  was from forgetting her diruretic the day before. Follow up as needed. Keep feet elevated consider compression.   Pt has had symptoms for years. Need EMG to rule out neuropathy.

## 2018-03-26 ENCOUNTER — Ambulatory Visit (INDEPENDENT_AMBULATORY_CARE_PROVIDER_SITE_OTHER): Payer: Medicare Other

## 2018-03-26 ENCOUNTER — Encounter: Payer: Self-pay | Admitting: Family Medicine

## 2018-03-26 ENCOUNTER — Ambulatory Visit (INDEPENDENT_AMBULATORY_CARE_PROVIDER_SITE_OTHER): Payer: Medicare Other | Admitting: Family Medicine

## 2018-03-26 VITALS — BP 154/83 | HR 81 | Temp 98.7°F | Wt 190.0 lb

## 2018-03-26 DIAGNOSIS — R05 Cough: Secondary | ICD-10-CM | POA: Diagnosis not present

## 2018-03-26 DIAGNOSIS — N393 Stress incontinence (female) (male): Secondary | ICD-10-CM | POA: Diagnosis not present

## 2018-03-26 DIAGNOSIS — R059 Cough, unspecified: Secondary | ICD-10-CM

## 2018-03-26 DIAGNOSIS — Z87891 Personal history of nicotine dependence: Secondary | ICD-10-CM

## 2018-03-26 DIAGNOSIS — J040 Acute laryngitis: Secondary | ICD-10-CM | POA: Diagnosis not present

## 2018-03-26 MED ORDER — HYDROCODONE-HOMATROPINE 5-1.5 MG/5ML PO SYRP: 5.0000 mL | ORAL_SOLUTION | Freq: Four times a day (QID) | ORAL | 0 refills | Status: DC | PRN

## 2018-03-26 NOTE — Progress Notes (Signed)
Janet Richards is a 57 y.o. female who presents to [facility]: [place] today for follow up laryngitis.   Janet Richards was seen several times over the last several weeks for cough congestion and runny nose eventually diagnosed with laryngitis.  She was treated with Augmentin, prednisone, Tessalon Perles, and Hycodan cough syrup.  She is completed the Augmentin and prednisone course.  She notes the Hycodan was quite helpful but she has run out.  She is not sure how helpful the [employer] were.  She notes her hoarse voice and sore throat have improved however she continues to experience bothersome nighttime cough.  She will occasionally will have urinary incontinence with coughing as well that she finds distressing.  She denies current fevers or chills chest pain or palpitations.   Her PCP ordered a chest x-ray which she has not gotten done yet.  She notes that she did not take her blood pressure medication today.     ROS as above:  Exam:  BP (!) 154/83   Pulse 81   Temp 98.7 F (37.1 C) (Oral)   Wt 190 lb (86.2 kg)   BMI 33.67 kg/m  Gen: Well NAD HEENT: EOMI,  MMM posterior pharynx normal-appearing.  No cervical lymphadenopathy.  Normal tympanic membranes bilaterally.  Mild inflamed nasal turbinates bilaterally. Lungs: Normal work of breathing. CTABL hoarse voice quality slight Heart: RRR no MRG Abd: NABS, Soft. Nondistended, Nontender Exts: Brisk capillary refill, warm and well perfused.     Assessment and Plan: 57 y.o. female with  Laryngitis: Clinically improved but still somewhat symptomatic.  Cough seems to be the biggest symptom.  Plan to treat with refill of Hycodan cough syrup for use mainly at bedtime.  Continue Tessalon Perles as needed.  I recommend patient get her chest x-ray that is already been ordered and will contact patient with results when back.  Plan  for continued symptomatic care.  Urinary incontinence with cough.  This is not been an issue prior to the current illness.  Plan to control cough to help control urinary incontinence.  If not sufficient will consider anticholinergics.  Follow-up with PCP for this issue as well as for hypertension in the near future.   No orders of the defined types were placed in this encounter.  Meds ordered this encounter  Medications  . HYDROcodone-homatropine (HYCODAN) 5-1.5 MG/5ML syrup    Sig: Take 5 mLs by mouth every 6 (six) hours as needed.    Dispense:  120 mL    Refill:  0     Historical information moved to improve visibility of documentation.  Past Medical History:  Diagnosis Date  . Essential hypertension, benign   . Menstrual migraine   . Periodontitis   . Primary osteoarthritis of both knees   . Urticaria    Past Surgical History:  Procedure Laterality Date  . TUBAL LIGATION  12/26/1987   Social History   Tobacco Use  . Smoking status: Former Smoker    Packs/day: 1.50    Types: Cigarettes    Last attempt to quit: 02/05/2006    Years since quitting: 12.1  . Smokeless tobacco: Never Used  Substance Use Topics  . Alcohol use: Yes    Comment: rarely   family history includes CAD in her father; Hypertension in her mother.  Medications: Current Outpatient Medications  Medication Sig Dispense Refill  . albuterol (PROAIR HFA) 108 (90 Base) MCG/ACT inhaler INHALE TWO PUFFS EVERY 4-6  HOURS ONLY AS NEEDED FOR SHORTNESS OF BREATH OR WHEEZING. 8.5 Inhaler 0  . benzonatate (TESSALON) 200 MG capsule Take 1 capsule (200 mg total) by mouth 2 (two) times daily as needed for cough. 20 capsule 0  . Biotin 1000 MCG CHEW Chew by mouth.    . cetirizine HCl (ZYRTEC) 1 MG/ML solution TAKE 10 MLS (10 MG TOTAL) BY MOUTH DAILY. 944 mL 4  . conjugated estrogens (PREMARIN) vaginal cream Place 1 Applicatorful vaginally daily. For first 2 weeks then decrease to 3 times a week. 42.5 g 12  .  hydrochlorothiazide (HYDRODIURIL) 25 MG tablet TAKE 1 TABLET BY MOUTH EVERY DAY 90 tablet 0  . HYDROcodone-homatropine (HYCODAN) 5-1.5 MG/5ML syrup Take 5 mLs by mouth every 6 (six) hours as needed. 120 mL 0  . ipratropium (ATROVENT) 0.06 % nasal spray Place 2 sprays into both nostrils 4 (four) times daily.    . montelukast (SINGULAIR) 10 MG tablet Take 10 mg by mouth at bedtime.     . pantoprazole (PROTONIX) 40 MG tablet TAKE 1 TABLET (40 MG TOTAL) BY MOUTH DAILY. 90 tablet 1  . Probiotic Product (PROBIOTIC-10) CAPS Take by mouth.     No current facility-administered medications for this visit.    Allergies  Allergen Reactions  . Azithromycin Nausea And Vomiting  . Clindamycin/Lincomycin Hives    Facial swelling  . Metronidazole Hives    Rash, has had fluconazole (Diflucan) w/o reaction  . Sulfa Antibiotics Hives    Health Maintenance Health Maintenance  Topic Date Due  . Hepatitis C Screening  07-07-1961  . INFLUENZA VACCINE  06/07/2018  . MAMMOGRAM  08/19/2019  . PAP SMEAR  10/26/2019  . TETANUS/TDAP  10/17/2024  . COLONOSCOPY  03/19/2025  . HIV Screening  Completed    Discussed warning signs or symptoms. Please see discharge instructions. Patient expresses understanding.

## 2018-03-26 NOTE — Patient Instructions (Addendum)
Thank you for coming in today. Get xray now.  Use cough medicine at bedtime as needed.  Recheck as needed.    Cough, Adult A cough helps to clear your throat and lungs. A cough may last only 2-3 weeks (acute), or it may last longer than 8 weeks (chronic). Many different things can cause a cough. A cough may be a sign of an illness or another medical condition. Follow these instructions at home:  Pay attention to any changes in your cough.  Take medicines only as told by your doctor. ? If you were prescribed an antibiotic medicine, take it as told by your doctor. Do not stop taking it even if you start to feel better. ? Talk with your doctor before you try using a cough medicine.  Drink enough fluid to keep your pee (urine) clear or pale yellow.  If the air is dry, use a cold steam vaporizer or humidifier in your home.  Stay away from things that make you cough at work or at home.  If your cough is worse at night, try using extra pillows to raise your head up higher while you sleep.  Do not smoke, and try not to be around smoke. If you need help quitting, ask your doctor.  Do not have caffeine.  Do not drink alcohol.  Rest as needed. Contact a doctor if:  You have new problems (symptoms).  You cough up yellow fluid (pus).  Your cough does not get better after 2-3 weeks, or your cough gets worse.  Medicine does not help your cough and you are not sleeping well.  You have pain that gets worse or pain that is not helped with medicine.  You have a fever.  You are losing weight and you do not know why.  You have night sweats. Get help right away if:  You cough up blood.  You have trouble breathing.  Your heartbeat is very fast. This information is not intended to replace advice given to you by your health care provider. Make sure you discuss any questions you have with your health care provider. Document Released: 07/07/2011 Document Revised: 03/31/2016 Document  Reviewed: 12/31/2014 Elsevier Interactive Patient Education  [employer].

## 2018-03-27 NOTE — Progress Notes (Signed)
Call pt: normal CXR. [facility] [facility].

## 2018-03-28 ENCOUNTER — Ambulatory Visit (INDEPENDENT_AMBULATORY_CARE_PROVIDER_SITE_OTHER): Payer: Medicare Other | Admitting: Physician Assistant

## 2018-03-28 ENCOUNTER — Encounter: Payer: Self-pay | Admitting: Physician Assistant

## 2018-03-28 VITALS — BP 115/66 | HR 98 | Ht 63.0 in | Wt 184.0 lb

## 2018-03-28 DIAGNOSIS — R059 Cough, unspecified: Secondary | ICD-10-CM

## 2018-03-28 DIAGNOSIS — R05 Cough: Secondary | ICD-10-CM | POA: Diagnosis not present

## 2018-03-28 DIAGNOSIS — R0602 Shortness of breath: Secondary | ICD-10-CM

## 2018-03-28 DIAGNOSIS — J040 Acute laryngitis: Secondary | ICD-10-CM

## 2018-03-28 MED ORDER — IPRATROPIUM-ALBUTEROL 20-100 MCG/ACT IN AERS: 1.0000 | INHALATION_SPRAY | Freq: Four times a day (QID) | RESPIRATORY_TRACT | 1 refills | Status: DC | PRN

## 2018-03-28 MED ORDER — IPRATROPIUM-ALBUTEROL 0.5-2.5 (3) MG/3ML IN SOLN
3.0000 mL | Freq: Once | RESPIRATORY_TRACT | Status: AC
  Administered 2018-03-28: 3 mL via RESPIRATORY_TRACT

## 2018-03-28 NOTE — Patient Instructions (Signed)
Allergist

## 2018-03-29 ENCOUNTER — Encounter: Payer: Self-pay | Admitting: Physician Assistant

## 2018-03-29 NOTE — Progress Notes (Signed)
Subjective:    Patient ID: Janet Richards, female    DOB: 06-20-1961, 57 y.o.   MRN: [id number]  HPI Pt is a 57 yo female who presents to the clinic to "make sure she doesn't need oxygen". She has been seen frequently over the past few weeks. She has had some sort of URI that is linguring and causing some cough, laryngitis, SOb. CXR was negative. Pt has finished prednisone and abx. She has some cough syrup and using OTC cough relief. No fever, chills, sinus pressure, body aches. She is very tired and finds it hard to catch her breath. Albuterol inhaler has helped a lot. No overt wheezing.   .. Active Ambulatory Problems    Diagnosis Date Noted  . Primary osteoarthritis of both knees 12/27/2012  . Exertional chest pain 02/05/2013  . Abnormal uterine bleeding 02/05/2013  . Essential hypertension, benign 03/01/2013  . No blood products 03/28/2013  . 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 Osteoarthritis of first metatarsophalangeal joint 04/11/2014  . IFG (impaired fasting glucose) 10/17/2014  . Chronic allergic rhinitis 01/14/2015  . Hyperlipidemia 02/20/2015  . History of colonic polyps 03/20/2015  . Paresthesias 07/01/2015  . Right ankle pain 02/24/2016  . Fatigue 02/24/2016  . Anemia, iron deficiency 02/25/2016  . Chronic dental infection 04/11/2016  . Ganglion cyst of right foot 07/12/2016  . Burning sensation of foot 09/02/2016  . Abnormal ankle brachial index (ABI) 11/09/2016  . Vaginal dryness, menopausal 03/01/2017  . Neuropathy 03/01/2017  . Hypertriglyceridemia 09/21/2017  . Dysfunction of both eustachian tubes 11/26/2017  . Recurrent sinusitis 11/26/2017  . Periodontitis 12/15/2017  . Perimenopausal 01/25/2018  . Urinary urgency 01/25/2018  . DDD (degenerative disc disease), cervical 02/22/2018  . Caregiver stress 03/16/2018   Resolved Ambulatory Problems    Diagnosis Date Noted  . Allergic urticaria 12/27/2012  . Dental abscess 02/05/2013  . Menstrual migraine  02/05/2013  . Right lumbar radiculitis 04/19/2013  . Hyperglycemia 08/05/2013  . Allergic rhinitis 01/22/2014  . Left knee pain 02/25/2014  . Dental infection 03/20/2014  . Allergic sinusitis 01/14/2015  . Pain, dental 01/14/2015  . Sinusitis 06/26/2015  . Chest pain 07/08/2015  . Right knee injury 07/15/2015  . Neck pain 09/01/2015  . UTI (urinary tract infection) 11/25/2015  . Acute upper respiratory infection 12/22/2015  . Low blood potassium 05/24/2016  . Elevated lipase 05/24/2016  . Insect bite 05/25/2016   Past Medical History:  Diagnosis Date  . Essential hypertension, benign   . Menstrual migraine   . Periodontitis   . Primary osteoarthritis of both knees   . Urticaria       Review of Systems See HPI.     Objective:   Physical Exam  Constitutional: She is oriented to person, place, and time. She appears well-developed and well-nourished.  HENT:  Head: Normocephalic and atraumatic.  Nose: Nose normal.  Mouth/Throat: Oropharynx is clear and moist.  Eyes: Pupils are equal, round, and reactive to light. Conjunctivae are normal.  Neck: Normal range of motion. Neck supple.  Cardiovascular: Normal rate and regular rhythm.  Pulmonary/Chest: Effort normal and breath sounds normal. She has no wheezes.  Lymphadenopathy:    She has no cervical adenopathy.  Neurological: She is alert and oriented to person, place, and time.  Psychiatric: She has a normal mood and affect. Her behavior is normal.          Assessment & Plan:  Marland KitchenMarland KitchenDiagnoses and all orders for this visit:  Laryngitis  SOB (shortness of breath) -  Ipratropium-Albuterol (COMBIVENT) 20-100 MCG/ACT AERS respimat; Inhale 1 puff into the lungs every 6 (six) hours as needed for wheezing or shortness of breath.  Cough -     Ipratropium-Albuterol (COMBIVENT) 20-100 MCG/ACT AERS respimat; Inhale 1 puff into the lungs every 6 (six) hours as needed for wheezing or shortness of breath. -      ipratropium-albuterol (DUONEB) 0.5-2.5 (3) MG/3ML nebulizer solution 3 mL   Peak flow in yellow duoneb given. Post peak flows did improve but not significantly.   Pulse ox is 100 percent.   Reassured patient she did not need O2 and doing well.   Gave combivent to use as rescue as needed. This may give her some extra sympomatic relief.   Reassurance given about time/rest/hydration needed to get over this. Some components could also be allergies. Continue on singulair. She would like to go to an allergiest.   Follow up as needed.   [name].Spent 30 minutes with patient and greater than 50 percent of visit spent counseling patient regarding treatment plan.

## 2018-04-07 ENCOUNTER — Other Ambulatory Visit: Payer: Self-pay | Admitting: Physician Assistant

## 2018-04-13 ENCOUNTER — Ambulatory Visit (INDEPENDENT_AMBULATORY_CARE_PROVIDER_SITE_OTHER): Payer: Medicare Other | Admitting: Physician Assistant

## 2018-04-13 ENCOUNTER — Encounter: Payer: Self-pay | Admitting: Physician Assistant

## 2018-04-13 VITALS — BP 114/64 | HR 98 | Ht 63.0 in | Wt 190.0 lb

## 2018-04-13 DIAGNOSIS — N62 Hypertrophy of breast: Secondary | ICD-10-CM

## 2018-04-13 DIAGNOSIS — M549 Dorsalgia, unspecified: Secondary | ICD-10-CM | POA: Diagnosis not present

## 2018-04-13 DIAGNOSIS — H6983 Other specified disorders of Eustachian tube, bilateral: Secondary | ICD-10-CM

## 2018-04-13 DIAGNOSIS — H6993 Unspecified Eustachian tube disorder, bilateral: Secondary | ICD-10-CM

## 2018-04-13 DIAGNOSIS — J029 Acute pharyngitis, unspecified: Secondary | ICD-10-CM | POA: Diagnosis not present

## 2018-04-13 LAB — POCT RAPID STREP A (OFFICE): Rapid Strep A Screen: NEGATIVE

## 2018-04-13 NOTE — Patient Instructions (Signed)
Eustachian Tube Dysfunction The eustachian tube connects the middle ear to the back of the nose. It regulates air pressure in the middle ear by allowing air to move between the ear and nose. It also helps to drain fluid from the middle ear space. When the eustachian tube does not function properly, air pressure, fluid, or both can build up in the middle ear. Eustachian tube dysfunction can affect one or both ears. What are the causes? This condition happens when the eustachian tube becomes blocked or cannot open normally. This may result from:  Ear infections.  Colds and other upper respiratory infections.  Allergies.  Irritation, such as from cigarette smoke or acid from the stomach coming up into the esophagus (gastroesophageal reflux).  Sudden changes in air pressure, such as from descending in an airplane.  Abnormal growths in the nose or throat, such as nasal polyps, tumors, or enlarged tissue at the back of the throat (adenoids).  What increases the risk? This condition may be more likely to develop in people who smoke and people who are overweight. Eustachian tube dysfunction may also be more likely to develop in children, especially children who have:  Certain birth defects of the mouth, such as cleft palate.  Large tonsils and adenoids.  What are the signs or symptoms? Symptoms of this condition may include:  A feeling of fullness in the ear.  Ear pain.  Clicking or popping noises in the ear.  Ringing in the ear.  Hearing loss.  Loss of balance.  Symptoms may get worse when the air pressure around you changes, such as when you travel to an area of high elevation or fly on an airplane. How is this diagnosed? This condition may be diagnosed based on:  Your symptoms.  A physical exam of your ear, nose, and throat.  Tests, such as those that measure: ? The movement of your eardrum (tympanogram). ? Your hearing (audiometry).  How is this treated? Treatment  depends on the cause and severity of your condition. If your symptoms are mild, you may be able to relieve your symptoms by moving air into ("popping") your ears. If you have symptoms of fluid in your ears, treatment may include:  Decongestants.  Antihistamines.  Nasal sprays or ear drops that contain medicines that reduce swelling (steroids).  In some cases, you may need to have a procedure to drain the fluid in your eardrum (myringotomy). In this procedure, a small tube is placed in the eardrum to:  Drain the fluid.  Restore the air in the middle ear space.  Follow these instructions at home:  Take over-the-counter and prescription medicines only as told by your health care provider.  Use techniques to help pop your ears as recommended by your health care provider. These may include: ? Chewing gum. ? Yawning. ? Frequent, forceful swallowing. ? Closing your mouth, holding your nose closed, and gently blowing as if you are trying to blow air out of your nose.  Do not do any of the following until your health care provider approves: ? Travel to high altitudes. ? Fly in airplanes. ? Work in a pressurized cabin or room. ? Scuba dive.  Keep your ears dry. Dry your ears completely after showering or bathing.  Do not smoke.  Keep all follow-up visits as told by your health care provider. This is important. Contact a health care provider if:  Your symptoms do not go away after treatment.  Your symptoms come back after treatment.  You are   unable to pop your ears.  You have: ? A fever. ? Pain in your ear. ? Pain in your head or neck. ? Fluid draining from your ear.  Your hearing suddenly changes.  You become very dizzy.  You lose your balance. This information is not intended to replace advice given to you by your health care provider. Make sure you discuss any questions you have with your health care provider. Document Released: 11/20/2015 Document Revised: 03/31/2016  Document Reviewed: 11/12/2014 Elsevier Interactive Patient Education  2018 [employer]  

## 2018-04-13 NOTE — Progress Notes (Signed)
Subjective:    Patient ID: Janet Richards, female    DOB: Oct 07, 1961, 57 y.o.   MRN: [id number]  HPI Pt is a 57 yo female who has presented to the clinic multiple times over the past 3 months for similar symptoms. She was feeling great until about 1 day ago when ST started and now her ears feel "like they are getting ready to pop". No fever, chills, sinus pressure, body aches. She is not tried anything other than her regular medications. She does have allergies.   She complains of her breast size of 36 C. She states it has caused lots of upper back pain over the years. At times she feels likes it causes chest pressure and "she just has to lift her breast up and breath".   .. Active Ambulatory Problems    Diagnosis Date Noted  . Primary osteoarthritis of both knees 12/27/2012  . Exertional chest pain 02/05/2013  . Abnormal uterine bleeding 02/05/2013  . Essential hypertension, benign 03/01/2013  . No blood products 03/28/2013  . 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 Osteoarthritis of first metatarsophalangeal joint 04/11/2014  . IFG (impaired fasting glucose) 10/17/2014  . Chronic allergic rhinitis 01/14/2015  . Hyperlipidemia 02/20/2015  . History of colonic polyps 03/20/2015  . Paresthesias 07/01/2015  . Right ankle pain 02/24/2016  . Fatigue 02/24/2016  . Anemia, iron deficiency 02/25/2016  . Chronic dental infection 04/11/2016  . Ganglion cyst of right foot 07/12/2016  . Burning sensation of foot 09/02/2016  . Abnormal ankle brachial index (ABI) 11/09/2016  . Vaginal dryness, menopausal 03/01/2017  . Neuropathy 03/01/2017  . Hypertriglyceridemia 09/21/2017  . Dysfunction of both eustachian tubes 11/26/2017  . Recurrent sinusitis 11/26/2017  . Periodontitis 12/15/2017  . Perimenopausal 01/25/2018  . Urinary urgency 01/25/2018  . DDD (degenerative disc disease), cervical 02/22/2018  . Caregiver stress 03/16/2018  . Large breasts 04/15/2018   Resolved Ambulatory Problems     Diagnosis Date Noted  . Allergic urticaria 12/27/2012  . Dental abscess 02/05/2013  . Menstrual migraine 02/05/2013  . Right lumbar radiculitis 04/19/2013  . Hyperglycemia 08/05/2013  . Allergic rhinitis 01/22/2014  . Left knee pain 02/25/2014  . Dental infection 03/20/2014  . Allergic sinusitis 01/14/2015  . Pain, dental 01/14/2015  . Sinusitis 06/26/2015  . Chest pain 07/08/2015  . Right knee injury 07/15/2015  . Neck pain 09/01/2015  . UTI (urinary tract infection) 11/25/2015  . Acute upper respiratory infection 12/22/2015  . Low blood potassium 05/24/2016  . Elevated lipase 05/24/2016  . Insect bite 05/25/2016   Past Medical History:  Diagnosis Date  . Essential hypertension, benign   . Menstrual migraine   . Periodontitis   . Primary osteoarthritis of both knees   . Urticaria     Review of Systems  All other systems reviewed and are negative.      Objective:   Physical Exam  Constitutional: She is oriented to person, place, and time. She appears well-developed and well-nourished.  HENT:  Head: Normocephalic and atraumatic.  Right Ear: External ear normal.  Left Ear: External ear normal.  Nose: Nose normal.  Mouth/Throat: Oropharynx is clear and moist.  TM's clear.  Negative for any sinus tenderness.   Eyes: Pupils are equal, round, and reactive to light. Conjunctivae and EOM are normal.  Neck: Normal range of motion. Neck supple.  Cardiovascular: Normal rate and regular rhythm.  Pulmonary/Chest: Effort normal and breath sounds normal.  Lymphadenopathy:    She has no cervical adenopathy.  Neurological:  She is alert and oriented to person, place, and time. Coordination normal.  Psychiatric: She has a normal mood and affect. Her behavior is normal.          Assessment & Plan:  Marland KitchenMarland KitchenDiagnoses and all orders for this visit:  Sore throat -     POCT rapid strep A  ETD (Eustachian tube dysfunction), bilateral  Large breasts -     Ambulatory referral  to Plastic Surgery  Upper back pain -     Ambulatory referral to Plastic Surgery  .[name] Results for orders placed or performed in visit on 04/13/18  POCT rapid strep A  Result Value Ref Range   Rapid Strep A Screen Negative Negative   Discussed symptoms sound like allergies. She has appt with allergist that I suggest that she keep. Continue with antihistamine, nasal sprays, consider adding as needed decongestant. Reassurance given that I do not see any bacteria infection.   Will make referral for consult regarding breast.   ..Spent 30 minutes with patient and greater than 50 percent of visit spent counseling patient regarding treatment plan.

## 2018-04-15 ENCOUNTER — Other Ambulatory Visit: Payer: Self-pay | Admitting: Physician Assistant

## 2018-04-15 DIAGNOSIS — N62 Hypertrophy of breast: Secondary | ICD-10-CM | POA: Insufficient documentation

## 2018-04-16 ENCOUNTER — Telehealth: Payer: Self-pay | Admitting: Physician Assistant

## 2018-04-16 ENCOUNTER — Encounter: Payer: Self-pay | Admitting: Physician Assistant

## 2018-04-16 ENCOUNTER — Ambulatory Visit: Payer: Medicare Other | Admitting: Physician Assistant

## 2018-04-16 NOTE — Telephone Encounter (Signed)
Pt left at 10:08. She said she could not wait any longer because she had a graduation to attend.

## 2018-04-17 ENCOUNTER — Telehealth: Payer: Self-pay | Admitting: Physician Assistant

## 2018-04-17 NOTE — Progress Notes (Signed)
No charge. Patient had to leave due to time issues.

## 2018-04-17 NOTE — Telephone Encounter (Signed)
error 

## 2018-04-19 ENCOUNTER — Other Ambulatory Visit: Payer: Self-pay | Admitting: Physician Assistant

## 2018-04-19 DIAGNOSIS — R05 Cough: Secondary | ICD-10-CM

## 2018-04-19 DIAGNOSIS — R059 Cough, unspecified: Secondary | ICD-10-CM

## 2018-04-19 DIAGNOSIS — R0602 Shortness of breath: Secondary | ICD-10-CM

## 2018-04-23 ENCOUNTER — Encounter: Payer: Self-pay | Admitting: Physician Assistant

## 2018-04-23 ENCOUNTER — Ambulatory Visit (INDEPENDENT_AMBULATORY_CARE_PROVIDER_SITE_OTHER): Payer: Medicare Other | Admitting: Physician Assistant

## 2018-04-23 VITALS — BP 114/59 | HR 93 | Wt 193.0 lb

## 2018-04-23 DIAGNOSIS — S025XXB Fracture of tooth (traumatic), initial encounter for open fracture: Secondary | ICD-10-CM | POA: Diagnosis not present

## 2018-04-23 DIAGNOSIS — K047 Periapical abscess without sinus: Secondary | ICD-10-CM

## 2018-04-23 MED ORDER — IBUPROFEN 800 MG PO TABS: 800.0000 mg | ORAL_TABLET | Freq: Three times a day (TID) | ORAL | 0 refills | Status: DC | PRN

## 2018-04-23 MED ORDER — AMOXICILLIN-POT CLAVULANATE 875-125 MG PO TABS: 1.0000 | ORAL_TABLET | Freq: Two times a day (BID) | ORAL | 0 refills | Status: DC

## 2018-04-23 NOTE — Patient Instructions (Signed)
Dental Abscess A dental abscess is a collection of pus in or around a tooth. What are the causes? This condition is caused by a bacterial infection around the root of the tooth that involves the inner part of the tooth (pulp). It may result from:  Severe tooth decay.  Trauma to the tooth that allows bacteria to enter into the pulp, such as a broken or chipped tooth.  Severe gum disease around a tooth.  What are the signs or symptoms? Symptoms of this condition include:  Severe pain in and around the infected tooth.  Swelling and redness around the infected tooth, in the mouth, or in the face.  Tenderness.  Pus drainage.  Bad breath.  Bitter taste in the mouth.  Difficulty swallowing.  Difficulty opening the mouth.  Nausea.  Vomiting.  Chills.  Swollen neck glands.  Fever.  How is this diagnosed? This condition is diagnosed with examination of the infected tooth. During the exam, your dentist may tap on the infected tooth. Your dentist will also ask about your medical and dental history and may order X-rays. How is this treated? This condition is treated by eliminating the infection. This may be done with:  Antibiotic medicine.  A root canal. This may be performed to save the tooth.  Pulling (extracting) the tooth. This may also involve draining the abscess. This is done if the tooth cannot be saved.  Follow these instructions at home:  Take medicines only as directed by your dentist.  If you were prescribed antibiotic medicine, finish all of it even if you start to feel better.  Rinse your mouth (gargle) often with salt water to relieve pain or swelling.  Do not drive or operate heavy machinery while taking pain medicine.  Do not apply heat to the outside of your mouth.  Keep all follow-up visits as directed by your dentist. This is important. Contact a health care provider if:  Your pain is worse and is not helped by medicine. Get help right away  if:  You have a fever or chills.  Your symptoms suddenly get worse.  You have a very bad headache.  You have problems breathing or swallowing.  You have trouble opening your mouth.  You have swelling in your neck or around your eye. This information is not intended to replace advice given to you by your health care provider. Make sure you discuss any questions you have with your health care provider. Document Released: 10/24/2005 Document Revised: 03/03/2016 Document Reviewed: 10/21/2014 Elsevier Interactive Patient Education  2018 [employer]  

## 2018-04-23 NOTE — Progress Notes (Signed)
Subjective:    Patient ID: Janet Richards, female    DOB: 12-Aug-1961, 57 y.o.   MRN: [id number]  HPI  Patient is a 57 year old pleasant female who presents to the clinic with a painful, inflamed broken tooth of the upper right molar.  She has appointment with her dentist on Wednesday which is in 2 days.  She is starting to have some sweating, maxillary right sinus pain and " feeling of sickness throughout her body".  She has a history of poor dentition and multiple tooth abscesses.  She requests to be treated for this.  Currently right now she is managing her pain with Tylenol and ibuprofen at home.  No fever, chills, shortness of breath, cough.  She is having some body aches and generalized weakness.  .. Active Ambulatory Problems    Diagnosis Date Noted  . Primary osteoarthritis of both knees 12/27/2012  . Exertional chest pain 02/05/2013  . Abnormal uterine bleeding 02/05/2013  . Essential hypertension, benign 03/01/2013  . No blood products 03/28/2013  . 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 Osteoarthritis of first metatarsophalangeal joint 04/11/2014  . IFG (impaired fasting glucose) 10/17/2014  . Chronic allergic rhinitis 01/14/2015  . Hyperlipidemia 02/20/2015  . History of colonic polyps 03/20/2015  . Paresthesias 07/01/2015  . Right ankle pain 02/24/2016  . Fatigue 02/24/2016  . Anemia, iron deficiency 02/25/2016  . Chronic dental infection 04/11/2016  . Ganglion cyst of right foot 07/12/2016  . Burning sensation of foot 09/02/2016  . Abnormal ankle brachial index (ABI) 11/09/2016  . Vaginal dryness, menopausal 03/01/2017  . Neuropathy 03/01/2017  . Hypertriglyceridemia 09/21/2017  . Dysfunction of both eustachian tubes 11/26/2017  . Recurrent sinusitis 11/26/2017  . Periodontitis 12/15/2017  . Perimenopausal 01/25/2018  . Urinary urgency 01/25/2018  . DDD (degenerative disc disease), cervical 02/22/2018  . Caregiver stress 03/16/2018  . Large breasts 04/15/2018    Resolved Ambulatory Problems    Diagnosis Date Noted  . Allergic urticaria 12/27/2012  . Dental abscess 02/05/2013  . Menstrual migraine 02/05/2013  . Right lumbar radiculitis 04/19/2013  . Hyperglycemia 08/05/2013  . Allergic rhinitis 01/22/2014  . Left knee pain 02/25/2014  . Dental infection 03/20/2014  . Allergic sinusitis 01/14/2015  . Pain, dental 01/14/2015  . Sinusitis 06/26/2015  . Chest pain 07/08/2015  . Right knee injury 07/15/2015  . Neck pain 09/01/2015  . UTI (urinary tract infection) 11/25/2015  . Acute upper respiratory infection 12/22/2015  . Low blood potassium 05/24/2016  . Elevated lipase 05/24/2016  . Insect bite 05/25/2016   Past Medical History:  Diagnosis Date  . Essential hypertension, benign   . Menstrual migraine   . Periodontitis   . Primary osteoarthritis of both knees   . Urticaria       Review of Systems See HPI    Objective:   Physical Exam  Constitutional: She appears well-developed and well-nourished.  HENT:  Mouth/Throat:    Poor dentition.  Tenderness over right maxillary sinus to palpation.           Assessment & Plan:  Marland KitchenMarland KitchenLinley was seen today for dental pain.  Diagnoses and all orders for this visit:  Tooth abscess -     amoxicillin-clavulanate (AUGMENTIN) 875-125 MG tablet; Take 1 tablet by mouth 2 (two) times daily. For 10 days. -     ibuprofen (ADVIL,MOTRIN) 800 MG tablet; Take 1 tablet (800 mg total) by mouth every 8 (eight) hours as needed.  Open fracture of tooth, initial encounter -  amoxicillin-clavulanate (AUGMENTIN) 875-125 MG tablet; Take 1 tablet by mouth 2 (two) times daily. For 10 days. -     ibuprofen (ADVIL,MOTRIN) 800 MG tablet; Take 1 tablet (800 mg total) by mouth every 8 (eight) hours as needed.   Keep appt with dentist on Wednesday.  Start Augmentin today.  Use 800 mg ibuprofen as needed for pain.  Consider salt water gargles.  Continue with overall dental health management.

## 2018-04-24 ENCOUNTER — Telehealth: Payer: Self-pay | Admitting: Physician Assistant

## 2018-04-24 NOTE — Telephone Encounter (Signed)
Error

## 2018-05-01 ENCOUNTER — Encounter

## 2018-05-01 ENCOUNTER — Encounter: Admitting: Neurology

## 2018-05-10 ENCOUNTER — Emergency Department (INDEPENDENT_AMBULATORY_CARE_PROVIDER_SITE_OTHER)
Admission: EM | Admit: 2018-05-10 | Discharge: 2018-05-10 | Disposition: A | Discharge status: Home / Self Care | Payer: Medicare Other | Source: Home / Self Care

## 2018-05-10 ENCOUNTER — Emergency Department: Admission: EM | Admit: 2018-05-10 | Discharge: 2018-05-10 | Discharge status: Absent without leave | Payer: Self-pay

## 2018-05-10 ENCOUNTER — Encounter: Payer: Self-pay | Admitting: *Deleted

## 2018-05-10 ENCOUNTER — Other Ambulatory Visit: Payer: Self-pay

## 2018-05-10 ENCOUNTER — Other Ambulatory Visit: Payer: Self-pay | Admitting: Physician Assistant

## 2018-05-10 DIAGNOSIS — R220 Localized swelling, mass and lump, head: Secondary | ICD-10-CM

## 2018-05-10 DIAGNOSIS — K047 Periapical abscess without sinus: Secondary | ICD-10-CM

## 2018-05-10 DIAGNOSIS — K0889 Other specified disorders of teeth and supporting structures: Secondary | ICD-10-CM | POA: Diagnosis not present

## 2018-05-10 DIAGNOSIS — S025XXB Fracture of tooth (traumatic), initial encounter for open fracture: Secondary | ICD-10-CM

## 2018-05-10 MED ORDER — CEFTRIAXONE SODIUM 250 MG IJ SOLR: 250.0000 mg | Freq: Once | INTRAMUSCULAR | 0 refills | Status: AC

## 2018-05-10 MED ORDER — CEFTRIAXONE SODIUM 250 MG IJ SOLR
250.0000 mg | Freq: Once | INTRAMUSCULAR | Status: AC
  Administered 2018-05-10: 250 mg via INTRAMUSCULAR

## 2018-05-10 NOTE — ED Provider Notes (Signed)
[facility]    CSN: [id number] Arrival date & time: 05/10/18  1702     History   Chief Complaint Chief Complaint  Patient presents with  . Dental Problem   HPI Janet Richards is a 57 y.o. female presents with a complaint of facial swelling s/p tooth removal 4 days ago. Reports that she completed a 10 day course of antibiotic 5 days prior to having her tooth removed. Since tooth being pulled she has experienced left sided facial swelling. She feels as if the "infection is back". Denies erythema or gum abscess of the left face. She is taking ibuprofen 800 mg every 8 hours as directed. She has not attempted relief with heat applications. Past Medical History:  Diagnosis Date  . Essential hypertension, benign   . Menstrual migraine   . Periodontitis   . Primary osteoarthritis of both knees   . Urticaria     Patient Active Problem List   Diagnosis Date Noted  . Large breasts 04/15/2018  . Caregiver stress 03/16/2018  . DDD (degenerative disc disease), cervical 02/22/2018  . Perimenopausal 01/25/2018  . Urinary urgency 01/25/2018  . Periodontitis 12/15/2017  . Dysfunction of both eustachian tubes 11/26/2017  . Recurrent sinusitis 11/26/2017  . Hypertriglyceridemia 09/21/2017  . Vaginal dryness, menopausal 03/01/2017  . Neuropathy 03/01/2017  . Abnormal ankle brachial index (ABI) 11/09/2016  . Burning sensation of foot 09/02/2016  . Ganglion cyst of right foot 07/12/2016  . Chronic dental infection 04/11/2016  . Anemia, iron deficiency 02/25/2016  . Right ankle pain 02/24/2016  . Fatigue 02/24/2016  . Paresthesias 07/01/2015  . History of colonic polyps 03/20/2015  . Hyperlipidemia 02/20/2015  . Chronic allergic rhinitis 01/14/2015  . IFG (impaired fasting glucose) 10/17/2014  . Osteoarthritis of first metatarsophalangeal joint 04/11/2014  . 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 No blood products 03/28/2013  . Essential hypertension, benign 03/01/2013  .  Exertional chest pain 02/05/2013  . Abnormal uterine bleeding 02/05/2013  . Primary osteoarthritis of both knees 12/27/2012    Past Surgical History:  Procedure Laterality Date  . TUBAL LIGATION  12/26/1987    OB History    Gravida  5   Para  3   Term  3   Preterm      AB  2   Living  3     SAB      TAB  2   Ectopic      Multiple      Live Births               Home Medications    Prior to Admission medications   Medication Sig Start Date End Date Taking? Authorizing Provider  albuterol (PROAIR HFA) 108 (90 Base) MCG/ACT inhaler INHALE TWO PUFFS EVERY 4-6 HOURS ONLY AS NEEDED FOR SHORTNESS OF BREATH OR WHEEZING. 10/03/17   Breeback, Jade L, PA-C  Biotin 1000 MCG CHEW Chew by mouth.    [provider]  cetirizine HCl (ZYRTEC) 1 MG/ML solution TAKE 10 MLS (10 MG TOTAL) BY MOUTH DAILY. 04/09/18   Breeback, Luvenia Starch L, PA-C  conjugated estrogens (PREMARIN) vaginal cream Place 1 Applicatorful vaginally daily. For first 2 weeks then decrease to 3 times a week. 03/01/17   Breeback, Jade L, PA-C  hydrochlorothiazide (HYDRODIURIL) 25 MG tablet TAKE 1 TABLET BY MOUTH EVERY DAY 04/16/18   Breeback, Jade L, PA-C  ibuprofen (ADVIL,MOTRIN) 800 MG tablet Take 1 tablet (800 mg total) by mouth every 8 (eight) hours as needed. 04/23/18  Breeback, Jade L, PA-C  ipratropium (ATROVENT) 0.06 % nasal spray Place 2 sprays into both nostrils 4 (four) times daily.    [provider]  Ipratropium-Albuterol (COMBIVENT) 20-100 MCG/ACT AERS respimat Inhale 1 puff into the lungs every 6 (six) hours as needed for wheezing or shortness of breath. 03/28/18   Breeback, Jade L, PA-C  montelukast (SINGULAIR) 10 MG tablet Take 10 mg by mouth at bedtime.  04/04/13   [provider]  pantoprazole (PROTONIX) 40 MG tablet TAKE 1 TABLET (40 MG TOTAL) BY MOUTH DAILY. 04/19/18   Breeback, Royetta Car, PA-C  Probiotic Product (PROBIOTIC-10) CAPS Take by mouth.    [provider]     Family History Family History  Problem Relation Age of Onset  . Hypertension Mother   . CAD Father        MI in his 74s    Social History Social History   Tobacco Use  . Smoking status: Former Smoker    Packs/day: 1.50    Types: Cigarettes    Last attempt to quit: 02/05/2006    Years since quitting: 12.2  . Smokeless tobacco: Never Used  Substance Use Topics  . Alcohol use: Yes    Comment: rarely  . Drug use: No     Allergies   Azithromycin; Clindamycin/lincomycin; Metronidazole; and Sulfa antibiotics   Review of Systems Review of Systems Pertinent negatives listed in HPI  Physical Exam Triage Vital Signs ED Triage Vitals  Enc Vitals Group     BP 05/10/18 1734 122/78     Pulse Rate 05/10/18 1734 78     Resp 05/10/18 1734 16     Temp 05/10/18 1734 98.8 F (37.1 C)     Temp Source 05/10/18 1734 Oral     SpO2 05/10/18 1734 100 %     Weight 05/10/18 1735 192 lb (87.1 kg)     Height 05/10/18 1735 5\' 3"  (1.6 m)     Head Circumference --      Peak Flow --      Pain Score 05/10/18 1735 4     Pain Loc --      Pain Edu? --      Excl. in [place]? --    No data found.  Updated Vital Signs BP 122/78 (BP Location: Right Arm)   Pulse 78   Temp 98.8 F (37.1 C) (Oral)   Resp 16   Ht 5\' 3"  (1.6 m)   Wt 192 lb (87.1 kg)   SpO2 100%   BMI 34.01 kg/m   Visual Acuity Right Eye Distance:   Left Eye Distance:   Bilateral Distance:    Right Eye Near:   Left Eye Near:    Bilateral Near:     Physical Exam  Constitutional: She is oriented to person, place, and time. She appears well-developed and well-nourished.  HENT:  Head: Head is without left periorbital erythema.  Nose: Nose normal.  Mouth/Throat: Oropharynx is clear and moist. No oral lesions. No dental abscesses.  Mild edema of left upper jaw noted in comparison with right upper jaw. Left upper jaw is negative of erythema and is non-tender with palpation.  Cardiovascular: Normal rate, regular rhythm,  normal heart sounds and intact distal pulses.  Pulmonary/Chest: Effort normal and breath sounds normal.  Neurological: She is alert and oriented to person, place, and time.  Psychiatric: Her mood appears anxious.   UC Treatments / Results  Labs (all labs ordered are listed, but only abnormal results are displayed)  Labs Reviewed - No data to display  EKG None  Radiology No results found.  Procedures Procedures (including critical care time)  Medications Ordered in UC Medications - No data to display  Initial Impression / Assessment and Plan / UC Course  I have reviewed the triage vital signs and the nursing notes.  Pertinent labs & imaging results that were available during my care of the patient were reviewed by me and considered in my medical decision making (see chart for details).  Patient presents today with a complaint of left side facial swelling secondary to a recent tooth extraction. She is concern for possible return of oral infection that she recently completed a 10-day course of antibiotics  she recently treated for. Educated on the importance of only prescribing antibiotic therapy when it is completely warranted. Educated on inflammation vs infection. Patient insisted she was experiencing oral infection. Agreed to order 1 dose Rocephin 250 mg IM. Continue Ibuprofen as prescribed.Follow-up with PCP and dentist. Patient verbalized understanding.   The patient was given clear instructions to go to ER or return to medical center if symptoms do not improve, worsen or new problems develop. The patient verbalized understanding.  Final Clinical Impressions(s) / UC Diagnoses   Final diagnoses:  Pain, dental  Swelling of left side of face   Discharge Instructions   Continue Ibuprofen and follow-up with dentist    ED Prescriptions    Medication Sig Dispense Auth. Provider   cefTRIAXone (ROCEPHIN) 250 MG injection Inject 250 mg into the muscle once for 1 dose.  FOR IM use in  LARGE MUSCLE MASS 1 each Scot Jun, FNP     Controlled Substance Prescriptions [place] Controlled Substance Registry consulted? Not Applicable   Scot Jun, FNP 05/11/18 1540

## 2018-05-10 NOTE — ED Triage Notes (Signed)
Pt c/o dental pain, cheeks swelling and HA x 3 days after having a tooth extracted. She has taken Augmentin and IBF.

## 2018-05-11 ENCOUNTER — Encounter: Payer: Self-pay | Admitting: Physician Assistant

## 2018-05-11 ENCOUNTER — Ambulatory Visit (INDEPENDENT_AMBULATORY_CARE_PROVIDER_SITE_OTHER): Payer: Medicare Other | Admitting: Physician Assistant

## 2018-05-11 VITALS — BP 131/80 | HR 78 | Ht 62.99 in | Wt 191.0 lb

## 2018-05-11 DIAGNOSIS — K089 Disorder of teeth and supporting structures, unspecified: Secondary | ICD-10-CM | POA: Diagnosis not present

## 2018-05-11 DIAGNOSIS — G8929 Other chronic pain: Secondary | ICD-10-CM | POA: Diagnosis not present

## 2018-05-11 DIAGNOSIS — R519 Headache, unspecified: Secondary | ICD-10-CM

## 2018-05-11 DIAGNOSIS — R51 Headache: Secondary | ICD-10-CM | POA: Diagnosis not present

## 2018-05-11 DIAGNOSIS — J3489 Other specified disorders of nose and nasal sinuses: Secondary | ICD-10-CM

## 2018-05-11 MED ORDER — DOXYCYCLINE HYCLATE 100 MG PO TABS: 100.0000 mg | ORAL_TABLET | Freq: Two times a day (BID) | ORAL | 0 refills | Status: DC

## 2018-05-11 NOTE — Progress Notes (Signed)
Subjective:    Patient ID: Janet Richards, female    DOB: Jan 18, 1961, 57 y.o.   MRN: [id number]  HPI Pt presents to the clinic to discuss dental pain,sinus pressure, nasal congestion, facial pain after having her upper right molar pulled on Monday. She was given amoxicillin but did not pick it up since she was given augmentin pre surgery by myself. She feels like she still has some infection. Hx of peridontal disease by which she gets her teeth cleaned every 3 months. She went to [facility] yesterday and given a shot of rocephin and feels some better today but the pressure and facial pain is persistent. No fever, chills, SOB, body aches.   .. Active Ambulatory Problems    Diagnosis Date Noted  . Primary osteoarthritis of both knees 12/27/2012  . Exertional chest pain 02/05/2013  . Abnormal uterine bleeding 02/05/2013  . Essential hypertension, benign 03/01/2013  . No blood products 03/28/2013  . 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 Osteoarthritis of first metatarsophalangeal joint 04/11/2014  . IFG (impaired fasting glucose) 10/17/2014  . Chronic allergic rhinitis 01/14/2015  . Hyperlipidemia 02/20/2015  . History of colonic polyps 03/20/2015  . Paresthesias 07/01/2015  . Right ankle pain 02/24/2016  . Fatigue 02/24/2016  . Anemia, iron deficiency 02/25/2016  . Chronic dental infection 04/11/2016  . Ganglion cyst of right foot 07/12/2016  . Burning sensation of foot 09/02/2016  . Abnormal ankle brachial index (ABI) 11/09/2016  . Vaginal dryness, menopausal 03/01/2017  . Neuropathy 03/01/2017  . Hypertriglyceridemia 09/21/2017  . Dysfunction of both eustachian tubes 11/26/2017  . Recurrent sinusitis 11/26/2017  . Periodontitis 12/15/2017  . Perimenopausal 01/25/2018  . Urinary urgency 01/25/2018  . DDD (degenerative disc disease), cervical 02/22/2018  . Caregiver stress 03/16/2018  . Large breasts 04/15/2018   Resolved Ambulatory Problems    Diagnosis Date Noted  . Allergic  urticaria 12/27/2012  . Dental abscess 02/05/2013  . Menstrual migraine 02/05/2013  . Right lumbar radiculitis 04/19/2013  . Hyperglycemia 08/05/2013  . Allergic rhinitis 01/22/2014  . Left knee pain 02/25/2014  . Dental infection 03/20/2014  . Allergic sinusitis 01/14/2015  . Pain, dental 01/14/2015  . Sinusitis 06/26/2015  . Chest pain 07/08/2015  . Right knee injury 07/15/2015  . Neck pain 09/01/2015  . UTI (urinary tract infection) 11/25/2015  . Acute upper respiratory infection 12/22/2015  . Low blood potassium 05/24/2016  . Elevated lipase 05/24/2016  . Insect bite 05/25/2016   Past Medical History:  Diagnosis Date  . Essential hypertension, benign   . Menstrual migraine   . Periodontitis   . Primary osteoarthritis of both knees   . Urticaria       Review of Systems See HPI.     Objective:   Physical Exam  Constitutional: She is oriented to person, place, and time. She appears well-developed and well-nourished.  HENT:  Head: Normocephalic and atraumatic.  Right Ear: External ear normal.  Left Ear: External ear normal.  Nose: Nose normal.  Mouth/Throat: Oropharynx is clear and moist. No oropharyngeal exudate.    Tenderness over right maxillary sinus.   Eyes: Conjunctivae and EOM are normal.  Neck: Normal range of motion. Neck supple.  Cardiovascular: Normal rate and regular rhythm.  Pulmonary/Chest: Effort normal and breath sounds normal.  Lymphadenopathy:    She has no cervical adenopathy.  Neurological: She is alert and oriented to person, place, and time.  Psychiatric: She has a normal mood and affect. Her behavior is normal.  Assessment & Plan:  Marland KitchenMarland KitchenVidalia was seen today for dental injury.  Diagnoses and all orders for this visit:  Chronic dental pain -     doxycycline (VIBRA-TABS) 100 MG tablet; Take 1 tablet (100 mg total) by mouth 2 (two) times daily. For 10 days.  Sinus pressure -     doxycycline (VIBRA-TABS) 100 MG tablet;  Take 1 tablet (100 mg total) by mouth 2 (two) times daily. For 10 days.  Facial pain -     doxycycline (VIBRA-TABS) 100 MG tablet; Take 1 tablet (100 mg total) by mouth 2 (two) times daily. For 10 days.   Where the tooth was removed seems to be healing well. No sign of abscess. Pt is having some sinususitis like symptoms. I wonder if could be more from inflammation. Discussed this with patient she is fairly concerned about infection and PCN not working. She has some allergies and intolerances on file. Will given 10 days of doxy. Discussed with patient if symptoms persist she needs to see ENT or back to dentist or CT of sinuses because I see no overt signs of infection on todays exam. Continue to use ibuprofen 800mg  as needed for pain.

## 2018-05-13 DIAGNOSIS — J3489 Other specified disorders of nose and nasal sinuses: Secondary | ICD-10-CM | POA: Insufficient documentation

## 2018-05-17 ENCOUNTER — Encounter: Payer: Self-pay | Admitting: Allergy and Immunology

## 2018-05-17 ENCOUNTER — Encounter: Payer: Medicare Other | Admitting: Allergy and Immunology

## 2018-05-17 NOTE — Progress Notes (Signed)
This encounter was created in error - please disregard.

## 2018-05-18 ENCOUNTER — Telehealth: Payer: Self-pay | Admitting: Obstetrics & Gynecology

## 2018-05-18 NOTE — Telephone Encounter (Signed)
Pt called c/o bleeding for two days. Pt states had sexual intercourse three times Wednesday and three times yesterday. Pt states she has not had menstruation cycle since May 2018. Pt states she has had interminttent sexual intercourse due to spouse with aneurysm until recent. Pt states she uses an estrogen cream from time to time but not lately. Is this bleeding normal? Please advise.

## 2018-05-21 ENCOUNTER — Ambulatory Visit (INDEPENDENT_AMBULATORY_CARE_PROVIDER_SITE_OTHER): Payer: Medicare Other

## 2018-05-21 ENCOUNTER — Ambulatory Visit (INDEPENDENT_AMBULATORY_CARE_PROVIDER_SITE_OTHER): Payer: Medicare Other | Admitting: Physician Assistant

## 2018-05-21 ENCOUNTER — Encounter: Payer: Self-pay | Admitting: Physician Assistant

## 2018-05-21 VITALS — BP 138/83 | HR 80 | Wt 192.0 lb

## 2018-05-21 DIAGNOSIS — N950 Postmenopausal bleeding: Secondary | ICD-10-CM

## 2018-05-21 DIAGNOSIS — I10 Essential (primary) hypertension: Secondary | ICD-10-CM | POA: Diagnosis not present

## 2018-05-21 DIAGNOSIS — R9389 Abnormal findings on diagnostic imaging of other specified body structures: Secondary | ICD-10-CM | POA: Diagnosis not present

## 2018-05-21 HISTORY — DX: Postmenopausal bleeding: N95.0

## 2018-05-21 NOTE — Progress Notes (Signed)
Subjective:    Patient ID: Janet Richards, female    DOB: 12/03/1960, 57 y.o.   MRN: [id number]  HPI  Pt is a 57 yo female who presents to the clinic with post menopausal vaginal bleeding. She has not had a period in over a year and FSH was in menopausal range. Last Thursday she started bleeding. She stopped yesterday. She noticed she had all the pre-menstrual symptoms she used to have before her period such as breast soreness, moodiness, headaches. No bleeding or symptoms today. No abdominal pain. Denies any family hx of uterine cancer. Her mother had PMB after she went into menopause too. She see Dr. Gala Romney but was unable to get appt with her soon enough. She denies any trauma but does admit to having more sex than usual last week with 3-4 times a day for 2 days.    .. Active Ambulatory Problems    Diagnosis Date Noted  . Primary osteoarthritis of both knees 12/27/2012  . Exertional chest pain 02/05/2013  . Abnormal uterine bleeding 02/05/2013  . Essential hypertension, benign 03/01/2013  . No blood products 03/28/2013  . 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 Osteoarthritis of first metatarsophalangeal joint 04/11/2014  . IFG (impaired fasting glucose) 10/17/2014  . Chronic allergic rhinitis 01/14/2015  . Facial pain 01/14/2015  . Hyperlipidemia 02/20/2015  . History of colonic polyps 03/20/2015  . Paresthesias 07/01/2015  . Right ankle pain 02/24/2016  . Fatigue 02/24/2016  . Anemia, iron deficiency 02/25/2016  . Poor dentition 04/11/2016  . Ganglion cyst of right foot 07/12/2016  . Burning sensation of foot 09/02/2016  . Abnormal ankle brachial index (ABI) 11/09/2016  . Vaginal dryness, menopausal 03/01/2017  . Neuropathy 03/01/2017  . Hypertriglyceridemia 09/21/2017  . Dysfunction of both eustachian tubes 11/26/2017  . Recurrent sinusitis 11/26/2017  . Periodontitis 12/15/2017  . Perimenopausal 01/25/2018  . Urinary urgency 01/25/2018  . DDD (degenerative disc disease),  cervical 02/22/2018  . Caregiver stress 03/16/2018  . Large breasts 04/15/2018  . Sinus pressure 05/13/2018  . Post-menopausal bleeding 05/21/2018   Resolved Ambulatory Problems    Diagnosis Date Noted  . Allergic urticaria 12/27/2012  . Dental abscess 02/05/2013  . Menstrual migraine 02/05/2013  . Right lumbar radiculitis 04/19/2013  . Hyperglycemia 08/05/2013  . Allergic rhinitis 01/22/2014  . Left knee pain 02/25/2014  . Dental infection 03/20/2014  . Allergic sinusitis 01/14/2015  . Sinusitis 06/26/2015  . Chest pain 07/08/2015  . Right knee injury 07/15/2015  . Neck pain 09/01/2015  . UTI (urinary tract infection) 11/25/2015  . Acute upper respiratory infection 12/22/2015  . Low blood potassium 05/24/2016  . Elevated lipase 05/24/2016  . Insect bite 05/25/2016   Past Medical History:  Diagnosis Date  . Asthma   . Essential hypertension, benign   . Menstrual migraine   . Periodontitis   . Primary osteoarthritis of both knees   . Urticaria      Review of Systems  All other systems reviewed and are negative.      Objective:   Physical Exam  Constitutional: She is oriented to person, place, and time. She appears well-developed and well-nourished.  HENT:  Head: Normocephalic and atraumatic.  Eyes: Conjunctivae and EOM are normal.  Neck: Normal range of motion. Neck supple.  Cardiovascular: Normal rate and regular rhythm.  Pulmonary/Chest: Effort normal and breath sounds normal.  Abdominal: Soft. Bowel sounds are normal. She exhibits no distension and no mass. There is no tenderness. There is no rebound and no guarding.  Genitourinary: Uterus normal.  Genitourinary Comments: No vaginal discharge. Does appear to be a very small amt of blood coming from cervix. No polyps or masses. No adnexal tenderness.   Neurological: She is alert and oriented to person, place, and time.  Psychiatric: She has a normal mood and affect. Her behavior is normal.           Assessment & Plan:  Marland KitchenMarland KitchenKambri was seen today for vaginal bleeding.  Diagnoses and all orders for this visit:  Post-menopausal bleeding -     CBC with Differential/Platelet -     TSH -     US PELVIS (TRANSABDOMINAL ONLY) -     US PELVIS TRANSVANGINAL NON-OB (TV ONLY)  Essential hypertension, benign   Discussed with patient any post menopausal bleeding should be worked up for endometrial cancer. Pelvic u/s showed slightly thickened endometrium at 51mm. TSH and CBC ordered. I suggest follow up with GYN. I will routine note. Bleeding has stopped so no need for medication intervention. HO given.   BP looks good today.   [name].Spent 30 minutes with patient and greater than 50 percent of visit spent counseling patient regarding treatment plan.

## 2018-05-21 NOTE — Progress Notes (Signed)
Call pt: minimally thickened endometrium and radiologist suggested uterine biopsy I will send to Dr. Gala Romney for her input.

## 2018-05-21 NOTE — Patient Instructions (Signed)
Postmenopausal Bleeding Postmenopausal bleeding is any bleeding after menopause. Menopause is when a woman's period stops. Any type of bleeding after menopause is concerning. It should be checked by your doctor. Any treatment will depend on the cause. Follow these instructions at home: Watch your condition for any changes.  Avoid the use of tampons and douches as told by your doctor.  Change your pads often.  Get regular pelvic exams and Pap tests.  Keep all appointments for tests as told by your doctor.  Contact a doctor if:  Your bleeding lasts for more than 1 week.  You have belly (abdominal) pain.  You have bleeding after sex (intercourse). Get help right away if:  You have a fever, chills, a headache, dizziness, muscle aches, and bleeding.  You have strong pain with bleeding.  You have clumps of blood (blood clots) coming from your vagina.  You have bleeding and need more than 1 pad an hour.  You feel like you are going to pass out (faint). This information is not intended to replace advice given to you by your health care provider. Make sure you discuss any questions you have with your health care provider. Document Released: 08/02/2008 Document Revised: 03/31/2016 Document Reviewed: 05/23/2013 Elsevier Interactive Patient Education  2017 Elsevier Inc.  

## 2018-05-22 ENCOUNTER — Encounter: Payer: Self-pay | Admitting: Physician Assistant

## 2018-05-22 ENCOUNTER — Ambulatory Visit (INDEPENDENT_AMBULATORY_CARE_PROVIDER_SITE_OTHER): Payer: Medicare Other | Admitting: Physician Assistant

## 2018-05-22 VITALS — BP 123/58 | HR 86 | Wt 192.0 lb

## 2018-05-22 DIAGNOSIS — G4489 Other headache syndrome: Secondary | ICD-10-CM | POA: Diagnosis not present

## 2018-05-22 DIAGNOSIS — G629 Polyneuropathy, unspecified: Secondary | ICD-10-CM

## 2018-05-22 LAB — CBC WITH DIFFERENTIAL/PLATELET
Basophils Absolute: 13 cells/uL (ref 0–200)
Basophils Relative: 0.2 %
Eosinophils Absolute: 230 cells/uL (ref 15–500)
Eosinophils Relative: 3.6 %
HCT: 34.8 % — ABNORMAL LOW (ref 35.0–45.0)
Hemoglobin: 12 g/dL (ref 11.7–15.5)
Lymphs Abs: 3136 cells/uL (ref 850–3900)
MCH: 30.7 pg (ref 27.0–33.0)
MCHC: 34.5 g/dL (ref 32.0–36.0)
MCV: 89 fL (ref 80.0–100.0)
MPV: 10.2 fL (ref 7.5–12.5)
Monocytes Relative: 7.2 %
Neutro Abs: 2560 cells/uL (ref 1500–7800)
Neutrophils Relative %: 40 %
Platelets: 401 10*3/uL — ABNORMAL HIGH (ref 140–400)
RBC: 3.91 10*6/uL (ref 3.80–5.10)
RDW: 13 % (ref 11.0–15.0)
Total Lymphocyte: 49 %
WBC mixed population: 461 cells/uL (ref 200–950)
WBC: 6.4 10*3/uL (ref 3.8–10.8)

## 2018-05-22 LAB — TSH: TSH: 2.3 mIU/L

## 2018-05-22 MED ORDER — DEXAMETHASONE SODIUM PHOSPHATE 10 MG/ML IJ SOLN
8.0000 mg | Freq: Once | INTRAMUSCULAR | Status: AC
  Administered 2018-05-22: 8 mg via INTRAMUSCULAR

## 2018-05-22 NOTE — Patient Instructions (Signed)

## 2018-05-22 NOTE — Progress Notes (Signed)
Call pt: hgb looks good. No anemia. Thyroid normal and stable. I will send everything to Dr. Gala Romney for her to view. Make follow up appt with her office.

## 2018-05-22 NOTE — Progress Notes (Signed)
Subjective:    Patient ID: Janet Richards, female    DOB: 1961/10/30, 57 y.o.   MRN: [id number]  HPI  Patient is a 57 year old female who was seen yesterday for postmenopausal bleeding who started with a headache last night that is gone into this morning.  She feels mostly frontal and right temporal pressure.  She denies any vision changes.  She does have some positional dizziness.  She has been taking anti-inflammatories for her headache and has improved some.  She denies any upper extremity numbness, tingling, pain, weakness.  She does have a history of poor dentition and fairly recent extraction of a tooth on the right upper molar.  She denies any fever, chills, sinus congestion.  She has had some off and on right ear pain. Not started any new medications. She has felt like palpitations have worsened some.   She continues to have some persistent numbness and tingling of her bilateral feet. Ongoing for over a year but not had work up.  No muscle cramping. No pain with walking.   .. Active Ambulatory Problems    Diagnosis Date Noted  . Primary osteoarthritis of both knees 12/27/2012  . Exertional chest pain 02/05/2013  . Abnormal uterine bleeding 02/05/2013  . Essential hypertension, benign 03/01/2013  . No blood products 03/28/2013  . 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 Osteoarthritis of first metatarsophalangeal joint 04/11/2014  . IFG (impaired fasting glucose) 10/17/2014  . Chronic allergic rhinitis 01/14/2015  . Facial pain 01/14/2015  . Hyperlipidemia 02/20/2015  . History of colonic polyps 03/20/2015  . Paresthesias 07/01/2015  . Right ankle pain 02/24/2016  . Fatigue 02/24/2016  . Anemia, iron deficiency 02/25/2016  . Poor dentition 04/11/2016  . Ganglion cyst of right foot 07/12/2016  . Burning sensation of foot 09/02/2016  . Abnormal ankle brachial index (ABI) 11/09/2016  . Vaginal dryness, menopausal 03/01/2017  . Neuropathy 03/01/2017  . Hypertriglyceridemia  09/21/2017  . Dysfunction of both eustachian tubes 11/26/2017  . Recurrent sinusitis 11/26/2017  . Periodontitis 12/15/2017  . Perimenopausal 01/25/2018  . Urinary urgency 01/25/2018  . DDD (degenerative disc disease), cervical 02/22/2018  . Caregiver stress 03/16/2018  . Large breasts 04/15/2018  . Sinus pressure 05/13/2018  . Post-menopausal bleeding 05/21/2018   Resolved Ambulatory Problems    Diagnosis Date Noted  . Allergic urticaria 12/27/2012  . Dental abscess 02/05/2013  . Menstrual migraine 02/05/2013  . Right lumbar radiculitis 04/19/2013  . Hyperglycemia 08/05/2013  . Allergic rhinitis 01/22/2014  . Left knee pain 02/25/2014  . Dental infection 03/20/2014  . Allergic sinusitis 01/14/2015  . Sinusitis 06/26/2015  . Chest pain 07/08/2015  . Right knee injury 07/15/2015  . Neck pain 09/01/2015  . UTI (urinary tract infection) 11/25/2015  . Acute upper respiratory infection 12/22/2015  . Low blood potassium 05/24/2016  . Elevated lipase 05/24/2016  . Insect bite 05/25/2016   Past Medical History:  Diagnosis Date  . Asthma   . Essential hypertension, benign   . Menstrual migraine   . Periodontitis   . Primary osteoarthritis of both knees   . Urticaria       Review of Systems  All other systems reviewed and are negative.      Objective:   Physical Exam  Constitutional: She is oriented to person, place, and time. She appears well-developed and well-nourished.  HENT:  Head: Normocephalic and atraumatic.  Mouth/Throat: Oropharynx is clear and moist.  Eyes: Pupils are equal, round, and reactive to light. EOM are normal.  Neck:  Normal range of motion. Neck supple.  Cardiovascular: Normal rate and regular rhythm.  Pulmonary/Chest: Effort normal.  Neurological: She is alert and oriented to person, place, and time.  Psychiatric: She has a normal mood and affect.          Assessment & Plan:  Marland KitchenMarland KitchenBerna was seen today for headache.  Diagnoses and all  orders for this visit:  Other headache syndrome -     dexamethasone (DECADRON) injection 8 mg  Neuropathy  vitals reassuring.  For headache decadron given IM today. Pt declined toradol in combination with. Unclear headache trigger. Follow up as needed. Keep a headache diary.   Pt has gabapentin at home. Encouraged her to start 100mg  up to 3 times a day with slow titration. Pt is scheduled for EMG in August.

## 2018-05-23 ENCOUNTER — Encounter: Payer: Self-pay | Admitting: Physician Assistant

## 2018-06-07 ENCOUNTER — Ambulatory Visit (INDEPENDENT_AMBULATORY_CARE_PROVIDER_SITE_OTHER): Payer: Medicare Other | Admitting: Osteopathic Medicine

## 2018-06-07 ENCOUNTER — Encounter: Payer: Self-pay | Admitting: Osteopathic Medicine

## 2018-06-07 VITALS — BP 101/68 | HR 99 | Temp 98.1°F | Wt 190.3 lb

## 2018-06-07 DIAGNOSIS — S50862A Insect bite (nonvenomous) of left forearm, initial encounter: Secondary | ICD-10-CM | POA: Diagnosis not present

## 2018-06-07 DIAGNOSIS — W57XXXA Bitten or stung by nonvenomous insect and other nonvenomous arthropods, initial encounter: Secondary | ICD-10-CM | POA: Diagnosis not present

## 2018-06-07 NOTE — Progress Notes (Signed)
HPI: Janet Richards is a 57 y.o. female who  has a past medical history of Asthma, Essential hypertension, benign, Menstrual migraine, Periodontitis, Primary osteoarthritis of both knees, and Urticaria.  she presents to [facility] today, 06/07/18,  for chief complaint of:  Rash  About a week ago, developed 3 bumps on her right forearm.  They were really itchy at first but are now a lot better.  She tried some over-the-counter hydrocortisone cream as well as spray.  She is not sure what she may have been bitten by, thinks it have been a spider and just wants to get checked out   Past medical history, surgical history, and family history reviewed.  Current medication list and allergy/intolerance information reviewed.   (See remainder of HPI, ROS, Phys Exam below)    ASSESSMENT/PLAN:   Insect bite of left forearm, initial encounter   Advised on supportive care, I think that the skin will just take some time to heal.  Not sure what she may have been bitten by but this does not appear to be a severe or life/limb-threatening reaction     Patient Instructions  Insect Bite, Adult An insect bite can make your skin red, itchy, and swollen. Some insects can spread disease to people with a bite. However, most insect bites do not lead to disease, and most are not serious. Follow these instructions at home: Bite area care  Do not scratch the bite area.  Keep the bite area clean and dry.  Wash the bite area every day with soap and water as told by your doctor.  Check the bite area every day for signs of infection. Check for: ? More redness, swelling, or pain. ? Fluid or blood. ? Warmth. ? Pus. Managing pain, itching, and swelling  You may put any of these on the bite area as told by your doctor: ? A baking soda paste. ? Cortisone cream. ? Calamine lotion.  If directed, put ice on the bite area. ? Put ice in a plastic bag. ? Place a towel  between your skin and the bag. ? Leave the ice on for 20 minutes, 2-3 times a day. Medicines  Take medicines or put medicines on your skin only as told by your doctor.  If you were prescribed an antibiotic medicine, use it as told by your doctor. Do not stop using the antibiotic even if your condition improves. General instructions  Keep all follow-up visits as told by your doctor. This is important. How is this prevented? To help you have a lower risk of insect bites:  When you are outside, wear clothing that covers your arms and legs.  Use insect repellent. The best insect repellents have: ? An active ingredient of DEET, picaridin, oil of lemon eucalyptus (OLE), or IR3535. ? Higher amounts of DEET or another active ingredient than other repellents have.  If your home windows do not have screens, think about putting some in.  Contact a doctor if:  You have more redness, swelling, or pain in the bite area.  You have fluid, blood, or pus coming from the bite area.  The bite area feels warm.  You have a fever. Get help right away if:  You have joint pain.  You have a rash.  You have shortness of breath.  You feel more tired or sleepy than you normally do.  You have neck pain.  You have a headache.  You feel weaker than you normally do.  You have chest pain.  You have pain in your belly.  You feel sick to your stomach (nauseous) or you throw up (vomit). Summary  An insect bite can make your skin red, itchy, and swollen.  Do not scratch the bite area, and keep it clean and dry.  Ice can help with pain and itching from the bite. This information is not intended to replace advice given to you by your health care provider. Make sure you discuss any questions you have with your health care provider. Document Released: 10/21/2000 Document Revised: 05/26/2016 Document Reviewed: 03/11/2015 Elsevier Interactive Patient Education  2018 [employer].    Follow-up  plan: Return if symptoms worsen or fail to improve.     ############################################ ############################################ ############################################ ############################################    Outpatient Encounter Medications as of 06/07/2018  Medication Sig  . albuterol (PROAIR HFA) 108 (90 Base) MCG/ACT inhaler INHALE TWO PUFFS EVERY 4-6 HOURS ONLY AS NEEDED FOR SHORTNESS OF BREATH OR WHEEZING.  . cetirizine HCl (ZYRTEC) 1 MG/ML solution TAKE 10 MLS (10 MG TOTAL) BY MOUTH DAILY.  [name] conjugated estrogens (PREMARIN) vaginal cream Place 1 Applicatorful vaginally daily. For first 2 weeks then decrease to 3 times a week.  . hydrochlorothiazide (HYDRODIURIL) 25 MG tablet TAKE 1 TABLET BY MOUTH EVERY DAY  . ibuprofen (ADVIL,MOTRIN) 800 MG tablet TAKE 1 TABLET BY MOUTH EVERY 8 HOURS AS NEEDED  . ipratropium (ATROVENT) 0.06 % nasal spray Place 2 sprays into both nostrils 4 (four) times daily.  . Ipratropium-Albuterol (COMBIVENT) 20-100 MCG/ACT AERS respimat Inhale 1 puff into the lungs every 6 (six) hours as needed for wheezing or shortness of breath.  . montelukast (SINGULAIR) 10 MG tablet Take 10 mg by mouth at bedtime.   . Olopatadine HCl 0.2 % SOLN   . pantoprazole (PROTONIX) 40 MG tablet TAKE 1 TABLET (40 MG TOTAL) BY MOUTH DAILY.  . Probiotic Product (PROBIOTIC-10) CAPS Take by mouth.   No facility-administered encounter medications on file as of 06/07/2018.    Allergies  Allergen Reactions  . Azithromycin Nausea And Vomiting  . Clindamycin/Lincomycin Hives    Facial swelling  . Metronidazole Hives    Rash, has had fluconazole (Diflucan) w/o reaction  . Penicillins Swelling and Other (See Comments)    Patient states does not work for her.  Patient states does not work for her.  She denies swelling or hives from penicillin. She states penicillin not effective for infections. Has taken cephalosporins without problems  . Sulfa Antibiotics  Hives      Review of Systems:  Constitutional: No recent illness  Cardiac: No  chest pain, No  pressure,   Respiratory:  No  shortness of breath.  Gastrointestinal: No  abdominal pain  Musculoskeletal: No new myalgia/arthralgia  Skin: +Rash  Hem/Onc: No  easy bruising/bleeding, No  abnormal lumps/bumps  Neurologic: No  weakness, No  Dizziness   Exam:  BP 101/68 (BP Location: Left Arm, Patient Position: Sitting, Cuff Size: Normal)   Pulse 99   Temp 98.1 F (36.7 C) (Oral)   Wt 190 lb 4.8 oz (86.3 kg)   LMP 09/24/2017   BMI 33.71 kg/m   Constitutional: VS see above. General Appearance: alert, well-developed, well-nourished, NAD  Eyes: Normal lids and conjunctive, non-icteric sclera  Ears, Nose, Mouth, Throat: MMM, Normal external inspection ears/nares/mouth/lips/gums.  Neck: No masses, trachea midline.   Respiratory: Normal respiratory effort.   Musculoskeletal: Gait normal.   Neurological: Normal balance/coordination. No tremor.  Skin: warm, dry, intact.  3 very slightly  erythematous, nontender, nonfluctuant papules consistent with insect bite on right forearm.  Examined under magnification and lighting, no concerns for residual mandible marks, stingers, etc  Psychiatric: Normal judgment/insight. Normal mood and affect. Oriented x3.   Visit summary with medication list and pertinent instructions was printed for patient to review, advised to alert [place] if any changes needed. All questions at time of visit were answered - patient instructed to contact office with any additional concerns. ER/RTC precautions were reviewed with the patient and understanding verbalized.   Follow-up plan: Return if symptoms worsen or fail to improve.  Note: Total time spent 15 minutes, greater than 50% of the visit was spent face-to-face counseling and coordinating care for the following: The encounter diagnosis was Insect bite of left forearm, initial encounter..  Please note: voice  recognition software was used to produce this document, and typos may escape review. Please contact Dr. Sheppard Coil for any needed clarifications.

## 2018-06-07 NOTE — Patient Instructions (Signed)
Insect Bite, Adult An insect bite can make your skin red, itchy, and swollen. Some insects can spread disease to people with a bite. However, most insect bites do not lead to disease, and most are not serious. Follow these instructions at home: Bite area care  Do not scratch the bite area.  Keep the bite area clean and dry.  Wash the bite area every day with soap and water as told by your doctor.  Check the bite area every day for signs of infection. Check for: ? More redness, swelling, or pain. ? Fluid or blood. ? Warmth. ? Pus. Managing pain, itching, and swelling  You may put any of these on the bite area as told by your doctor: ? A baking soda paste. ? Cortisone cream. ? Calamine lotion.  If directed, put ice on the bite area. ? Put ice in a plastic bag. ? Place a towel between your skin and the bag. ? Leave the ice on for 20 minutes, 2-3 times a day. Medicines  Take medicines or put medicines on your skin only as told by your doctor.  If you were prescribed an antibiotic medicine, use it as told by your doctor. Do not stop using the antibiotic even if your condition improves. General instructions  Keep all follow-up visits as told by your doctor. This is important. How is this prevented? To help you have a lower risk of insect bites:  When you are outside, wear clothing that covers your arms and legs.  Use insect repellent. The best insect repellents have: ? An active ingredient of DEET, picaridin, oil of lemon eucalyptus (OLE), or IR3535. ? Higher amounts of DEET or another active ingredient than other repellents have.  If your home windows do not have screens, think about putting some in.  Contact a doctor if:  You have more redness, swelling, or pain in the bite area.  You have fluid, blood, or pus coming from the bite area.  The bite area feels warm.  You have a fever. Get help right away if:  You have joint pain.  You have a rash.  You have  shortness of breath.  You feel more tired or sleepy than you normally do.  You have neck pain.  You have a headache.  You feel weaker than you normally do.  You have chest pain.  You have pain in your belly.  You feel sick to your stomach (nauseous) or you throw up (vomit). Summary  An insect bite can make your skin red, itchy, and swollen.  Do not scratch the bite area, and keep it clean and dry.  Ice can help with pain and itching from the bite. This information is not intended to replace advice given to you by your health care provider. Make sure you discuss any questions you have with your health care provider. Document Released: 10/21/2000 Document Revised: 05/26/2016 Document Reviewed: 03/11/2015 Elsevier Interactive Patient Education  2018 Elsevier Inc.  

## 2018-06-11 ENCOUNTER — Telehealth: Payer: Self-pay

## 2018-06-11 MED ORDER — CHLORHEXIDINE GLUCONATE 0.12% ORAL RINSE (MEDLINE KIT): 15.0000 mL | Freq: Two times a day (BID) | OROMUCOSAL | 0 refills | Status: DC

## 2018-06-11 NOTE — Telephone Encounter (Signed)
I'm ok to send mouthwash for gingivitis but if she's concerned about a dental infection and is requesting po abx she will need a visit here or w/ dentist to r/o deeper or urgent infection

## 2018-06-11 NOTE — Telephone Encounter (Signed)
Pt called, states she has a gum infection. Pt reports very painful gums and states her dentis does not have any appointments for 2 weeks. Pt reports some discoloration in her feet and is not sure if it is related to gum infection or not.  Pt requesting an antibiotic along with diflucan to treat infection until she can be seen by dentist. Advised pt that PCP is out of office and usually gum infections are not treated over the phone, but pt states this is "ongoing issue" which is always addressed for her over the phone by [facility] or Dr Georgina Snell.   Note: pt states she cannot take amoxicillin, azithromycin, clindamycin, nor any sulfa drugs..  Please advise.Marland KitchenMarland Kitchen

## 2018-06-11 NOTE — Telephone Encounter (Signed)
Pt advised. Will try mouthwash. Would like appt. Transferred to scheduler.

## 2018-06-12 ENCOUNTER — Encounter: Admitting: Neurology

## 2018-06-12 ENCOUNTER — Encounter

## 2018-06-12 ENCOUNTER — Telehealth: Payer: Self-pay | Admitting: Neurology

## 2018-06-12 NOTE — Telephone Encounter (Signed)
This is a second no show for EMG nerve conduction study evaluation, the patient will be discharged from our practice.

## 2018-06-13 ENCOUNTER — Emergency Department (INDEPENDENT_AMBULATORY_CARE_PROVIDER_SITE_OTHER)
Admission: EM | Admit: 2018-06-13 | Discharge: 2018-06-13 | Disposition: A | Discharge status: Home / Self Care | Payer: Medicare Other | Source: Home / Self Care | Attending: Family Medicine | Admitting: Family Medicine

## 2018-06-13 ENCOUNTER — Other Ambulatory Visit: Payer: Self-pay

## 2018-06-13 ENCOUNTER — Encounter: Payer: Self-pay | Admitting: Emergency Medicine

## 2018-06-13 ENCOUNTER — Encounter: Payer: Self-pay | Admitting: Neurology

## 2018-06-13 DIAGNOSIS — K0530 Chronic periodontitis, unspecified: Secondary | ICD-10-CM

## 2018-06-13 MED ORDER — FLUCONAZOLE 150 MG PO TABS: ORAL_TABLET | ORAL | 0 refills | Status: DC

## 2018-06-13 MED ORDER — DOXYCYCLINE HYCLATE 100 MG PO CAPS: 100.0000 mg | ORAL_CAPSULE | Freq: Two times a day (BID) | ORAL | 0 refills | Status: DC

## 2018-06-13 NOTE — ED Triage Notes (Signed)
Pt c/o dental pain, cheeks swelling and HA x2 weeks.

## 2018-06-13 NOTE — ED Provider Notes (Signed)
[facility]    CSN: [id number] Arrival date & time: 06/13/18  1452     History   Chief Complaint Chief Complaint  Patient presents with  . Headache  . Dizziness    HPI Janet Richards is a 57 y.o. female.   Patient reports that she has a history of gingivitis and sees her dentist every 3 months for cleaning.  She complains of increased pain in primarily her lower gums for about two weeks.  She wears an upper denture.  She has had mild swelling in her cheeks.  The history is provided by the patient.  Dental Pain  Location:  Upper and lower Quality:  Aching Severity:  Mild Onset quality:  Gradual Duration:  2 weeks Timing:  Constant Progression:  Unchanged Chronicity:  Recurrent Context: poor dentition   Previous work-up:  Dental exam Relieved by:  Nothing Worsened by:  Hot food/drink Ineffective treatments:  None tried Associated symptoms: facial pain, facial swelling, gum swelling and headaches   Associated symptoms: no congestion, no difficulty swallowing, no drooling, no fever, no neck pain, no neck swelling, no oral bleeding, no oral lesions and no trismus   Risk factors: periodontal disease     Past Medical History:  Diagnosis Date  . Asthma   . Essential hypertension, benign   . Menstrual migraine   . Periodontitis   . Primary osteoarthritis of both knees   . Urticaria     Patient Active Problem List   Diagnosis Date Noted  . Post-menopausal bleeding 05/21/2018  . Sinus pressure 05/13/2018  . Large breasts 04/15/2018  . Caregiver stress 03/16/2018  . DDD (degenerative disc disease), cervical 02/22/2018  . Perimenopausal 01/25/2018  . Urinary urgency 01/25/2018  . Periodontitis 12/15/2017  . Dysfunction of both eustachian tubes 11/26/2017  . Recurrent sinusitis 11/26/2017  . Hypertriglyceridemia 09/21/2017  . Vaginal dryness, menopausal 03/01/2017  . Neuropathy 03/01/2017  . Abnormal ankle brachial index (ABI) 11/09/2016  . Burning  sensation of foot 09/02/2016  . Ganglion cyst of right foot 07/12/2016  . Poor dentition 04/11/2016  . Anemia, iron deficiency 02/25/2016  . Right ankle pain 02/24/2016  . Fatigue 02/24/2016  . Paresthesias 07/01/2015  . History of colonic polyps 03/20/2015  . Hyperlipidemia 02/20/2015  . Chronic allergic rhinitis 01/14/2015  . Facial pain 01/14/2015  . IFG (impaired fasting glucose) 10/17/2014  . Osteoarthritis of first metatarsophalangeal joint 04/11/2014  . 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 No blood products 03/28/2013  . Essential hypertension, benign 03/01/2013  . Exertional chest pain 02/05/2013  . Abnormal uterine bleeding 02/05/2013  . Primary osteoarthritis of both knees 12/27/2012    Past Surgical History:  Procedure Laterality Date  . TUBAL LIGATION  12/26/1987    OB History    Gravida  5   Para  3   Term  3   Preterm      AB  2   Living  3     SAB      TAB  2   Ectopic      Multiple      Live Births               Home Medications    Prior to Admission medications   Medication Sig Start Date End Date Taking? Authorizing Provider  albuterol (PROAIR HFA) 108 (90 Base) MCG/ACT inhaler INHALE TWO PUFFS EVERY 4-6 HOURS ONLY AS NEEDED FOR SHORTNESS OF BREATH OR WHEEZING. 10/03/17   Alden Hipp, Jade L, PA-C  cetirizine HCl (  ZYRTEC) 1 MG/ML solution TAKE 10 MLS (10 MG TOTAL) BY MOUTH DAILY. 04/09/18   Breeback, Luvenia Starch L, PA-C  chlorhexidine gluconate, MEDLINE KIT, (PERIDEX) 0.12 % solution 15 mLs by Mouth Rinse route 2 (two) times daily. 06/11/18   Emeterio Reeve, DO  conjugated estrogens (PREMARIN) vaginal cream Place 1 Applicatorful vaginally daily. For first 2 weeks then decrease to 3 times a week. 03/01/17   Breeback, Royetta Car, PA-C  doxycycline (VIBRAMYCIN) 100 MG capsule Take 1 capsule (100 mg total) by mouth 2 (two) times daily. Take with food. 06/13/18   Kandra Nicolas, MD  fluconazole (DIFLUCAN) 150 MG tablet Take one tab by mouth for one dose.   May repeat in 72 hours. 06/13/18   Kandra Nicolas, MD  hydrochlorothiazide (HYDRODIURIL) 25 MG tablet TAKE 1 TABLET BY MOUTH EVERY DAY 04/16/18   Breeback, Jade L, PA-C  ibuprofen (ADVIL,MOTRIN) 800 MG tablet TAKE 1 TABLET BY MOUTH EVERY 8 HOURS AS NEEDED 05/11/18   Breeback, Jade L, PA-C  ipratropium (ATROVENT) 0.06 % nasal spray Place 2 sprays into both nostrils 4 (four) times daily.    [provider]  Ipratropium-Albuterol (COMBIVENT) 20-100 MCG/ACT AERS respimat Inhale 1 puff into the lungs every 6 (six) hours as needed for wheezing or shortness of breath. 03/28/18   Breeback, Jade L, PA-C  montelukast (SINGULAIR) 10 MG tablet Take 10 mg by mouth at bedtime.  04/04/13   [provider]  Olopatadine HCl 0.2 % SOLN  04/28/18   [provider]  pantoprazole (PROTONIX) 40 MG tablet TAKE 1 TABLET (40 MG TOTAL) BY MOUTH DAILY. 04/19/18   Breeback, Royetta Car, PA-C  Probiotic Product (PROBIOTIC-10) CAPS Take by mouth.    [provider]    Family History Family History  Problem Relation Age of Onset  . Hypertension Mother   . CAD Father        MI in his 73s  . Allergic rhinitis Father   . Angioedema Neg Hx   . Asthma Neg Hx   . Eczema Neg Hx   . Urticaria Neg Hx   . Immunodeficiency Neg Hx     Social History Social History   Tobacco Use  . Smoking status: Former Smoker    Packs/day: 1.50    Types: Cigarettes    Last attempt to quit: 02/05/2006    Years since quitting: 12.3  . Smokeless tobacco: Never Used  Substance Use Topics  . Alcohol use: Yes    Comment: rarely  . Drug use: No     Allergies   Azithromycin; Clindamycin/lincomycin; Penicillins; and Sulfa antibiotics   Review of Systems Review of Systems  Constitutional: Negative for fever.  HENT: Positive for facial swelling. Negative for congestion, drooling and mouth sores.   Musculoskeletal: Negative for neck pain.  Neurological: Positive for headaches.  All other systems reviewed and are  negative.    Physical Exam Triage Vital Signs ED Triage Vitals [06/13/18 1548]  Enc Vitals Group     BP 126/78     Pulse Rate 93     Resp      Temp 98 F (36.7 C)     Temp Source Oral     SpO2 99 %     Weight 189 lb (85.7 kg)     Height 5' 3" (1.6 m)     Head Circumference      Peak Flow      Pain Score 8     Pain Loc  Pain Edu?      Excl. in [place]?    No data found.  Updated Vital Signs BP 126/78 (BP Location: Right Arm)   Pulse 93   Temp 98 F (36.7 C) (Oral)   Ht 5' 3" (1.6 m)   Wt 189 lb (85.7 kg)   LMP 09/11/2017 (Approximate)   SpO2 99%   BMI 33.48 kg/m   Visual Acuity Right Eye Distance:   Left Eye Distance:   Bilateral Distance:    Right Eye Near:   Left Eye Near:    Bilateral Near:     Physical Exam  Constitutional: She appears well-developed and well-nourished. She does not appear ill.  HENT:  Head: Normocephalic.  Mouth/Throat: Oropharynx is clear and moist and mucous membranes are normal. She has dentures. No oral lesions. No uvula swelling.  Upper denture present.  Lower anterior gingiva tender to palpation.  Eyes: Pupils are equal, round, and reactive to light. EOM are normal.  Neck: Neck supple. No Kernig's sign noted.  Cardiovascular: Normal rate.  Pulmonary/Chest: Effort normal.  Neurological: She is alert.  Skin: Skin is warm and dry.  Nursing note and vitals reviewed.    UC Treatments / Results  Labs (all labs ordered are listed, but only abnormal results are displayed) Labs Reviewed - No data to display  EKG None  Radiology No results found.  Procedures Procedures (including critical care time)  Medications Ordered in UC Medications - No data to display  Initial Impression / Assessment and Plan / UC Course  I have reviewed the triage vital signs and the nursing notes.  Pertinent labs & imaging results that were available during my care of the patient were reviewed by me and considered in my medical decision making  (see chart for details).    Begin doxycycline 161m BID.  Rx for Diflucan at patient's request. Followup with dentist in about two weeks as scheduled.   Final Clinical Impressions(s) / UC Diagnoses   Final diagnoses:  Chronic periodontitis   Discharge Instructions   None    ED Prescriptions    Medication Sig Dispense Auth. Provider   doxycycline (VIBRAMYCIN) 100 MG capsule Take 1 capsule (100 mg total) by mouth 2 (two) times daily. Take with food. 20 capsule BKandra Nicolas MD   fluconazole (DIFLUCAN) 150 MG tablet Take one tab by mouth for one dose.  May repeat in 72 hours. 2 tablet BKandra Nicolas MD         BKandra Nicolas MD 06/13/18 1700

## 2018-06-14 ENCOUNTER — Encounter: Payer: Self-pay | Admitting: Neurology

## 2018-06-14 ENCOUNTER — Ambulatory Visit: Payer: Self-pay | Admitting: Osteopathic Medicine

## 2018-06-15 ENCOUNTER — Ambulatory Visit: Admitting: Allergy & Immunology

## 2018-06-27 ENCOUNTER — Encounter: Payer: Self-pay | Admitting: Obstetrics & Gynecology

## 2018-06-27 ENCOUNTER — Ambulatory Visit (INDEPENDENT_AMBULATORY_CARE_PROVIDER_SITE_OTHER): Payer: Medicare Other | Admitting: Obstetrics & Gynecology

## 2018-06-27 VITALS — BP 108/68 | HR 89 | Resp 16 | Ht 63.0 in | Wt 189.0 lb

## 2018-06-27 DIAGNOSIS — N950 Postmenopausal bleeding: Secondary | ICD-10-CM

## 2018-06-27 MED ORDER — MISOPROSTOL 200 MCG PO TABS: ORAL_TABLET | ORAL | 0 refills | Status: DC

## 2018-06-27 NOTE — Progress Notes (Signed)
   Subjective:    Patient ID: Janet Richards, female    DOB: 03-Apr-1961, 57 y.o.   MRN: [id number]  HPI 57 yo married P3 here today to discuss her u/s findings, done for PMB. The u/s showed a 5 mm endometrium.   Review of Systems     Objective:   Physical Exam Breathing, conversing, and ambulating normally Obese, well nourished, well hydrated Black female, no apparent distress        Assessment & Plan:  PMB with 5 mm endometrium She knows that she needs a EMBX and she would like to have pretreatment with cytotec. This will be scheduled.

## 2018-07-03 ENCOUNTER — Ambulatory Visit (INDEPENDENT_AMBULATORY_CARE_PROVIDER_SITE_OTHER): Payer: Medicare Other | Admitting: Obstetrics & Gynecology

## 2018-07-03 ENCOUNTER — Encounter: Payer: Self-pay | Admitting: Obstetrics & Gynecology

## 2018-07-03 VITALS — BP 118/78 | HR 82 | Resp 16 | Ht 63.0 in | Wt 189.0 lb

## 2018-07-03 DIAGNOSIS — N950 Postmenopausal bleeding: Secondary | ICD-10-CM

## 2018-07-03 DIAGNOSIS — Z3202 Encounter for pregnancy test, result negative: Secondary | ICD-10-CM

## 2018-07-03 DIAGNOSIS — Z01812 Encounter for preprocedural laboratory examination: Secondary | ICD-10-CM

## 2018-07-03 LAB — POCT URINE PREGNANCY: Preg Test, Ur: NEGATIVE

## 2018-07-03 NOTE — Progress Notes (Signed)
   Subjective:    Patient ID: Janet Richards, female    DOB: 10-31-1961, 57 y.o.   MRN: [id number]  HPI 57 yo married P3 here for [facility] due to PMB and endometrial thickness of 5 mm on gyn u/s. She took cytotec last night.   Review of Systems     Objective:   Physical Exam Breathing, conversing, and ambulating normally Well nourished, well hydrated Black female, no apparent distress  UPT negative, consent signed, time out done Cervix prepped with betadine and grasped with a single tooth tenaculum Uterus sounded to 9 cm Pipelle used for 2 passes with a  small amount of tissue obtained. She tolerated the procedure well.    Assessment & Plan:  PMB- await patholgy

## 2018-07-03 NOTE — Addendum Note (Signed)
Addended by: Emily Filbert on: 07/03/2018 03:18 PM   Modules accepted: Orders

## 2018-07-13 ENCOUNTER — Ambulatory Visit (INDEPENDENT_AMBULATORY_CARE_PROVIDER_SITE_OTHER): Payer: Medicare Other | Admitting: Physician Assistant

## 2018-07-13 VITALS — BP 126/54 | HR 79 | Temp 98.1°F | Wt 192.3 lb

## 2018-07-13 DIAGNOSIS — Z23 Encounter for immunization: Secondary | ICD-10-CM

## 2018-07-13 DIAGNOSIS — Z1159 Encounter for screening for other viral diseases: Secondary | ICD-10-CM

## 2018-07-13 NOTE — Progress Notes (Signed)
Just needs flu shot

## 2018-07-14 LAB — HEPATITIS C ANTIBODY
Hepatitis C Ab: NONREACTIVE
SIGNAL TO CUT-OFF: 0.02 (ref <1.00)

## 2018-07-16 ENCOUNTER — Ambulatory Visit

## 2018-07-16 ENCOUNTER — Encounter: Admitting: Neurology

## 2018-07-19 ENCOUNTER — Ambulatory Visit: Payer: Self-pay | Admitting: Family Medicine

## 2018-07-27 ENCOUNTER — Encounter: Payer: Self-pay | Admitting: Physician Assistant

## 2018-07-27 ENCOUNTER — Ambulatory Visit (INDEPENDENT_AMBULATORY_CARE_PROVIDER_SITE_OTHER): Payer: Medicare Other | Admitting: Physician Assistant

## 2018-07-27 VITALS — BP 100/58 | HR 86 | Temp 98.4°F | Ht 63.0 in | Wt 192.0 lb

## 2018-07-27 DIAGNOSIS — G44229 Chronic tension-type headache, not intractable: Secondary | ICD-10-CM

## 2018-07-27 DIAGNOSIS — H6983 Other specified disorders of Eustachian tube, bilateral: Secondary | ICD-10-CM | POA: Diagnosis not present

## 2018-07-27 DIAGNOSIS — G549 Nerve root and plexus disorder, unspecified: Secondary | ICD-10-CM

## 2018-07-27 DIAGNOSIS — J309 Allergic rhinitis, unspecified: Secondary | ICD-10-CM

## 2018-07-27 MED ORDER — CYCLOBENZAPRINE HCL 10 MG PO TABS: 10.0000 mg | ORAL_TABLET | Freq: Three times a day (TID) | ORAL | 0 refills | Status: DC | PRN

## 2018-07-27 NOTE — Patient Instructions (Signed)
Tension Headache A tension headache is pain, pressure, or aching that is felt over the front and sides of your head. These headaches can last from 30 minutes to several days. Follow these instructions at home: Managing pain  Take over-the-counter and prescription medicines only as told by your doctor.  Lie down in a dark, quiet room when you have a headache.  If directed, apply ice to your head and neck area: ? Put ice in a plastic bag. ? Place a towel between your skin and the bag. ? Leave the ice on for 20 minutes, 2-3 times per day.  Use a heating pad or a hot shower to apply heat to your head and neck area as told by your doctor. Eating and drinking  Eat meals on a regular schedule.  Do not drink a lot of alcohol.  Do not use a lot of caffeine, or stop using caffeine. General instructions  Keep all follow-up visits as told by your doctor. This is important.  Keep a journal to find out if certain things bring on headaches. For example, write down: ? What you eat and drink. ? How much sleep you get. ? Any change to your diet or medicines.  Try getting a massage, or doing other things that help you to relax.  Lessen stress.  Sit up straight. Do not tighten (tense) your muscles.  Do not use tobacco products. This includes cigarettes, chewing tobacco, or e-cigarettes. If you need help quitting, ask your doctor.  Exercise regularly as told by your doctor.  Get enough sleep. This may mean 7-9 hours of sleep. Contact a doctor if:  Your symptoms are not helped by medicine.  You have a headache that feels different from your usual headache.  You feel sick to your stomach (nauseous) or you throw up (vomit).  You have a fever. Get help right away if:  Your headache becomes very bad.  You keep throwing up.  You have a stiff neck.  You have trouble seeing.  You have trouble speaking.  You have pain in your eye or ear.  Your muscles are weak or you lose muscle  control.  You lose your balance or you have trouble walking.  You feel like you will pass out (faint) or you pass out.  You have confusion. This information is not intended to replace advice given to you by your health care provider. Make sure you discuss any questions you have with your health care provider. Document Released: 01/18/2010 Document Revised: 06/23/2016 Document Reviewed: 02/16/2015 Elsevier Interactive Patient Education  2018 [employer]  

## 2018-07-29 ENCOUNTER — Telehealth: Payer: Self-pay | Admitting: Physician Assistant

## 2018-07-29 ENCOUNTER — Encounter: Payer: Self-pay | Admitting: Physician Assistant

## 2018-07-29 DIAGNOSIS — G549 Nerve root and plexus disorder, unspecified: Secondary | ICD-10-CM | POA: Insufficient documentation

## 2018-07-29 HISTORY — DX: Nerve root and plexus disorder, unspecified: G54.9

## 2018-07-29 NOTE — Telephone Encounter (Signed)
I have viewed EMG from neurologist. As you eluded detects mild neuropathy. I did not see gabapentin on your med list but you have been given this before are you actively taking this? Gabapentin and lyrica are 2 medications that can help with neuropathy symptoms.

## 2018-07-29 NOTE — Progress Notes (Signed)
Subjective:    Patient ID: Janet Richards, female    DOB: [birth year], 57 y.o.   MRN: [id number]  HPI Pt is a 57 yo female with long hx of allergic rhinitis, conjunctivitis, rhinorrhea and urticaria who presents to the clinic with itchy ears, facial pressure, headache. She wants to make sure there is no infection from the weekend. She had to go off all antihistamine and allergy medications for last 2 weeks until next Tuesday since she is having allergy testing done. No fever, chills, SOB. inttermittently she feels "squeezing around her head". She is taking some tylenol and help some.   .. Active Ambulatory Problems    Diagnosis Date Noted  . Primary osteoarthritis of both knees 12/27/2012  . Exertional chest pain 02/05/2013  . Abnormal uterine bleeding 02/05/2013  . Essential hypertension, benign 03/01/2013  . No blood products 03/28/2013  . 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 Osteoarthritis of first metatarsophalangeal joint 04/11/2014  . IFG (impaired fasting glucose) 10/17/2014  . Chronic allergic rhinitis 01/14/2015  . Facial pain 01/14/2015  . Hyperlipidemia 02/20/2015  . History of colonic polyps 03/20/2015  . Paresthesias 07/01/2015  . Right ankle pain 02/24/2016  . Fatigue 02/24/2016  . Anemia, iron deficiency 02/25/2016  . Poor dentition 04/11/2016  . Ganglion cyst of right foot 07/12/2016  . Burning sensation of foot 09/02/2016  . Abnormal ankle brachial index (ABI) 11/09/2016  . Vaginal dryness, menopausal 03/01/2017  . Neuropathy 03/01/2017  . Hypertriglyceridemia 09/21/2017  . Dysfunction of both eustachian tubes 11/26/2017  . Recurrent sinusitis 11/26/2017  . Periodontitis 12/15/2017  . Perimenopausal 01/25/2018  . Urinary urgency 01/25/2018  . DDD (degenerative disc disease), cervical 02/22/2018  . Caregiver stress 03/16/2018  . Large breasts 04/15/2018  . Sinus pressure 05/13/2018  . Post-menopausal bleeding 05/21/2018   Resolved Ambulatory Problems   Diagnosis Date Noted  . Allergic urticaria 12/27/2012  . Dental abscess 02/05/2013  . Menstrual migraine 02/05/2013  . Right lumbar radiculitis 04/19/2013  . Hyperglycemia 08/05/2013  . Allergic rhinitis 01/22/2014  . Left knee pain 02/25/2014  . Dental infection 03/20/2014  . Allergic sinusitis 01/14/2015  . Sinusitis 06/26/2015  . Chest pain 07/08/2015  . Right knee injury 07/15/2015  . Neck pain 09/01/2015  . UTI (urinary tract infection) 11/25/2015  . Acute upper respiratory infection 12/22/2015  . Low blood potassium 05/24/2016  . Elevated lipase 05/24/2016  . Insect bite 05/25/2016   Past Medical History:  Diagnosis Date  . Asthma   . Urticaria       Review of Systems  All other systems reviewed and are negative.      Objective:   Physical Exam  Constitutional: She appears well-developed and well-nourished.  HENT:  Head: Normocephalic and atraumatic.  Right Ear: External ear normal.  Left Ear: External ear normal.  Nose: Nose normal.  Mouth/Throat: Oropharynx is clear and moist. No oropharyngeal exudate.  TM's clear bilaterally.  Scant cerumen.  Oropharynx clear with some PND present.  Nasal turbinates red and swollen.  Tenderness to palpation over maxillary sinuses.   Eyes: EOM are normal.  Conjunctiva injected minimally with some watery discharge. Dark circles under eyes.   Neck: Normal range of motion. Neck supple. No thyromegaly present.  Cardiovascular: Normal rate and regular rhythm.  Pulmonary/Chest: Effort normal and breath sounds normal.  Musculoskeletal:  Tight upper back and night muscles.   Lymphadenopathy:    She has no cervical adenopathy.  Skin: No rash noted.  Psychiatric: She has a normal mood  and affect. Her behavior is normal.          Assessment & Plan:  Marland KitchenMarland KitchenDiagnoses and all orders for this visit:  Allergic sinusitis  Chronic tension-type headache, not intractable -     cyclobenzaprine (FLEXERIL) 10 MG tablet; Take 1  tablet (10 mg total) by mouth 3 (three) times daily as needed for muscle spasms.  Chronic allergic rhinitis  Dysfunction of both eustachian tubes   Reassured I did not see any infection. Likely being off anti-histamines are causing her allergies to flare. Due to testing next week any intervention could effect allergy results. Encouraged sinus rinses with normal saline.   She has some really tight upper back and neck muscles. Her headaches sound like tension. Gave HO. Consider massage and heat. Muscle relaxer given to use as needed. Discussed sedation warning.

## 2018-07-31 ENCOUNTER — Ambulatory Visit (INDEPENDENT_AMBULATORY_CARE_PROVIDER_SITE_OTHER): Payer: Medicare Other | Admitting: Pediatrics

## 2018-07-31 ENCOUNTER — Encounter: Payer: Self-pay | Admitting: Pediatrics

## 2018-07-31 VITALS — BP 112/78 | HR 78 | Temp 98.0°F | Resp 16 | Ht 63.0 in | Wt 191.6 lb

## 2018-07-31 DIAGNOSIS — R1013 Epigastric pain: Secondary | ICD-10-CM | POA: Insufficient documentation

## 2018-07-31 DIAGNOSIS — J4520 Mild intermittent asthma, uncomplicated: Secondary | ICD-10-CM

## 2018-07-31 DIAGNOSIS — J3089 Other allergic rhinitis: Secondary | ICD-10-CM

## 2018-07-31 DIAGNOSIS — L563 Solar urticaria: Secondary | ICD-10-CM | POA: Diagnosis not present

## 2018-07-31 DIAGNOSIS — K219 Gastro-esophageal reflux disease without esophagitis: Secondary | ICD-10-CM

## 2018-07-31 DIAGNOSIS — I10 Essential (primary) hypertension: Secondary | ICD-10-CM | POA: Diagnosis not present

## 2018-07-31 HISTORY — DX: Epigastric pain: R10.13

## 2018-07-31 HISTORY — DX: Mild intermittent asthma, uncomplicated: J45.20

## 2018-07-31 MED ORDER — ALBUTEROL SULFATE HFA 108 (90 BASE) MCG/ACT IN AERS: 2.0000 | INHALATION_SPRAY | RESPIRATORY_TRACT | 1 refills | Status: DC | PRN

## 2018-07-31 MED ORDER — FLUTICASONE PROPIONATE 50 MCG/ACT NA SUSP: NASAL | 5 refills | Status: DC

## 2018-07-31 MED ORDER — MONTELUKAST SODIUM 10 MG PO TABS: ORAL_TABLET | ORAL | 5 refills | Status: DC
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
## 2018-07-31 NOTE — Patient Instructions (Signed)
Environmental control of dust mite and mold Zyrtec 10 mg-take 1 tablet once a day for runny nose or itchy eyes Fluticasone 2 sprays per nostril once a day for stuffy nose.  If you get a cold, do nasal saline irrigations followed by fluticasone until you get over the  cold OpconA-1 drop 3 times a day if needed for itchy eyes Montelukast 10 mg-take 1 tablet once a day to prevent coughing or wheezing Ventolin 2 puffs every 4 hours if needed for wheezing or coughing spells.  You may use 2 puffs of Ventolin 5 to 15 minutes before exercise Add prednisone 10 mg twice a day for 4 days, 10 mg on the fifth day to bring your allergic symptoms under control Continue on your other medications Call us if you are not doing well on this treatment plan

## 2018-08-01 NOTE — Telephone Encounter (Signed)
Pt states she still has some gabapentin left over from her RX written in 2018 from Dr Georgina Snell. She just started taking this yesterday. States she is taking 3 capsules 1 hour prior to bedtime.

## 2018-08-01 NOTE — Telephone Encounter (Signed)
Ok lets give this some time to work(2 weeks) and see if helps some!

## 2018-08-01 NOTE — Telephone Encounter (Signed)
Left msg for pt to call back

## 2018-08-02 ENCOUNTER — Ambulatory Visit (INDEPENDENT_AMBULATORY_CARE_PROVIDER_SITE_OTHER): Payer: Medicare Other | Admitting: Family Medicine

## 2018-08-02 ENCOUNTER — Encounter: Payer: Self-pay | Admitting: Family Medicine

## 2018-08-02 VITALS — BP 116/60 | HR 85 | Ht 63.0 in | Wt 192.0 lb

## 2018-08-02 DIAGNOSIS — G549 Nerve root and plexus disorder, unspecified: Secondary | ICD-10-CM | POA: Diagnosis not present

## 2018-08-02 DIAGNOSIS — K0530 Chronic periodontitis, unspecified: Secondary | ICD-10-CM

## 2018-08-02 DIAGNOSIS — K089 Disorder of teeth and supporting structures, unspecified: Secondary | ICD-10-CM | POA: Diagnosis not present

## 2018-08-02 DIAGNOSIS — Z1231 Encounter for screening mammogram for malignant neoplasm of breast: Secondary | ICD-10-CM

## 2018-08-02 DIAGNOSIS — Z1239 Encounter for other screening for malignant neoplasm of breast: Secondary | ICD-10-CM

## 2018-08-02 MED ORDER — CIPROFLOXACIN HCL 500 MG PO TABS: 500.0000 mg | ORAL_TABLET | Freq: Two times a day (BID) | ORAL | 1 refills | Status: DC

## 2018-08-02 MED ORDER — GABAPENTIN 300 MG PO CAPS: ORAL_CAPSULE | ORAL | 3 refills | Status: DC

## 2018-08-02 NOTE — Patient Instructions (Signed)
Thank you for coming in today. Use cipro until tooth extraction.  Ok to increase gabapentin to 300mg  pills 3x daily.  Mammogram schedule anytime after October 12th.  Recheck as needed.  Call or go to the emergency room if you get worse, have trouble breathing, have chest pains, or palpitations.

## 2018-08-02 NOTE — Progress Notes (Signed)
Janet Richards is a 57 y.o. female who presents to [facility] today for dental pain.  Janet Richards has a history of recurrent dental infection and is in the process of systematically having dental extractions.  She notes her right front upper lateral incisor is becoming very sensitive and painful consistent with early dental infections.  She is scheduled for dental extraction next week and is interested in antibiotic therapy if possible.  She is done well with this medications in the past for similar episodes.  No fevers chills nausea vomiting or diarrhea.  Additionally she notes that she is been diagnosed with sensory neuropathy in her feet bilaterally.  She has an old prescription for gabapentin that she is been taking.  She is been taking 3 to milligrams at night.  She has this is helpful but she has symptoms during the day and is interested for a prescription for gabapentin to take during the day as well.  Additionally she notes that she is going to be due for mammogram in the near future.   ROS as above:  Exam:  BP 116/60   Pulse 85   Ht 5\' 3"  (1.6 m)   Wt 192 lb (87.1 kg)   LMP 09/24/2017   BMI 34.01 kg/m  Wt Readings from Last 5 Encounters:  08/02/18 192 lb (87.1 kg)  07/31/18 191 lb 9.6 oz (86.9 kg)  07/27/18 192 lb (87.1 kg)  07/13/18 192 lb 4.8 oz (87.2 kg)  07/03/18 189 lb (85.7 kg)    Gen: Well NAD HEENT: EOMI,  MMM multiple missing teeth however the right upper lateral incisor is present and tender to to touch and cold.  No surrounding gum erythema. Lungs: Normal work of breathing. CTABL Heart: RRR no MRG Abd: NABS, Soft. Nondistended, Nontender Exts: Brisk capillary refill, warm and well perfused.   Lab and Radiology Results No results found for this or any previous visit (from the past 72 hour(s)). No results found.    Assessment and  Plan: 57 y.o. female with  Dental infection.  Treatment with ciprofloxacin.  Encourage dental extraction.  Neuropathy: Increase gabapentin up to 3 times daily.  New prescription sent.  Mammogram ordered.  Patient will schedule.   Orders Placed This Encounter  Procedures  . MM 3D SCREEN BREAST BILATERAL    Standing Status:   Future    Standing Expiration Date:   10/03/2019    Order Specific Question:   Reason for Exam (SYMPTOM  OR DIAGNOSIS REQUIRED)    Answer:   screen breast cancer any time after Oct 12    Order Specific Question:   Is the patient pregnant?    Answer:   No    Order Specific Question:   Preferred imaging location?    Answer:   [facility]   Meds ordered this encounter  Medications  . ciprofloxacin (CIPRO) 500 MG tablet    Sig: Take 1 tablet (500 mg total) by mouth 2 (two) times daily.    Dispense:  14 tablet    Refill:  1  . gabapentin (NEURONTIN) 300 MG capsule    Sig: One tab PO qHS for a week, then BID for a week, then TID. May double weekly to a max of 3,600mg /day    Dispense:  180 capsule    Refill:  3     Historical information moved to improve visibility of documentation.  Past Medical History:  Diagnosis Date  .  Asthma   . Essential hypertension, benign   . Menstrual migraine   . Periodontitis   . Primary osteoarthritis of both knees   . Sinusitis   . Urticaria    Past Surgical History:  Procedure Laterality Date  . TUBAL LIGATION  12/26/1987   Social History   Tobacco Use  . Smoking status: Former Smoker    Packs/day: 1.50    Types: Cigarettes    Last attempt to quit: 02/05/2006    Years since quitting: 12.4  . Smokeless tobacco: Never Used  Substance Use Topics  . Alcohol use: Yes    Comment: rarely   family history includes Allergic rhinitis in her father and son; Asthma in her son; CAD in her father; Hypertension in her mother; Urticaria in her sister.  Medications: Current Outpatient Medications  Medication Sig  Dispense Refill  . albuterol (VENTOLIN HFA) 108 (90 Base) MCG/ACT inhaler Inhale 2 puffs into the lungs every 4 (four) hours as needed for wheezing or shortness of breath. 1 Inhaler 1  . cetirizine HCl (ZYRTEC) 1 MG/ML solution TAKE 10 MLS (10 MG TOTAL) BY MOUTH DAILY. 944 mL 3  . ciprofloxacin (CIPRO) 500 MG tablet Take 1 tablet (500 mg total) by mouth 2 (two) times daily. 14 tablet 1  . conjugated estrogens (PREMARIN) vaginal cream Place 1 Applicatorful vaginally daily. For first 2 weeks then decrease to 3 times a week. 42.5 g 12  . cyclobenzaprine (FLEXERIL) 10 MG tablet Take 1 tablet (10 mg total) by mouth 3 (three) times daily as needed for muscle spasms. 30 tablet 0  . fluticasone (FLONASE) 50 MCG/ACT nasal spray 2 sprays per nostril once a day for stuffy nose. 16 g 5  . gabapentin (NEURONTIN) 300 MG capsule One tab PO qHS for a week, then BID for a week, then TID. May double weekly to a max of 3,600mg /day 180 capsule 3  . hydrochlorothiazide (HYDRODIURIL) 25 MG tablet TAKE 1 TABLET BY MOUTH EVERY DAY 90 tablet 1  . ibuprofen (ADVIL,MOTRIN) 800 MG tablet TAKE 1 TABLET BY MOUTH EVERY 8 HOURS AS NEEDED 60 tablet 0  . ipratropium (ATROVENT) 0.06 % nasal spray Place 2 sprays into both nostrils 4 (four) times daily.    . Ipratropium-Albuterol (COMBIVENT) 20-100 MCG/ACT AERS respimat Inhale 1 puff into the lungs every 6 (six) hours as needed for wheezing or shortness of breath. 1 Inhaler 1  . IRON, FERROUS SULFATE, PO Take by mouth.    . montelukast (SINGULAIR) 10 MG tablet Take 10 mg by mouth at bedtime.     . montelukast (SINGULAIR) 10 MG tablet Take 1 tablet once a day for coughing or wheezing. 34 tablet 5  . Multiple Vitamins-Minerals (MULTIVITAMIN ADULT PO) Take by mouth.    . Olopatadine HCl 0.2 % SOLN     . pantoprazole (PROTONIX) 40 MG tablet TAKE 1 TABLET (40 MG TOTAL) BY MOUTH DAILY. 90 tablet 1  . Probiotic Product (PROBIOTIC-10) CAPS Take by mouth.     No current facility-administered  medications for this visit.    Allergies  Allergen Reactions  . Azithromycin Nausea And Vomiting  . Clindamycin/Lincomycin Hives    Facial swelling  . Penicillins Swelling and Other (See Comments)    Patient states does not work for her.  She denies swelling or hives from penicillin. She states penicillin not effective for infections. Has taken cephalosporins without problems  . Sulfa Antibiotics Hives     Discussed warning signs or symptoms. Please see discharge instructions. Patient  expresses understanding.

## 2018-08-07 ENCOUNTER — Encounter

## 2018-08-07 ENCOUNTER — Encounter: Admitting: Neurology

## 2018-08-22 ENCOUNTER — Ambulatory Visit (INDEPENDENT_AMBULATORY_CARE_PROVIDER_SITE_OTHER): Payer: Medicare Other

## 2018-08-22 DIAGNOSIS — Z1231 Encounter for screening mammogram for malignant neoplasm of breast: Secondary | ICD-10-CM | POA: Diagnosis not present

## 2018-08-22 DIAGNOSIS — Z1239 Encounter for other screening for malignant neoplasm of breast: Secondary | ICD-10-CM

## 2018-08-29 ENCOUNTER — Other Ambulatory Visit: Payer: Self-pay | Admitting: Family Medicine

## 2018-09-11 ENCOUNTER — Ambulatory Visit: Payer: Self-pay | Admitting: Physician Assistant

## 2018-09-12 ENCOUNTER — Encounter: Payer: Self-pay | Admitting: Physician Assistant

## 2018-09-12 ENCOUNTER — Telehealth: Payer: Self-pay | Admitting: Physician Assistant

## 2018-09-12 ENCOUNTER — Ambulatory Visit (INDEPENDENT_AMBULATORY_CARE_PROVIDER_SITE_OTHER): Payer: Medicare Other | Admitting: Physician Assistant

## 2018-09-12 VITALS — BP 121/69 | HR 86 | Ht 63.0 in | Wt 193.0 lb

## 2018-09-12 DIAGNOSIS — K580 Irritable bowel syndrome with diarrhea: Secondary | ICD-10-CM

## 2018-09-12 DIAGNOSIS — N951 Menopausal and female climacteric states: Secondary | ICD-10-CM

## 2018-09-12 DIAGNOSIS — Z9109 Other allergy status, other than to drugs and biological substances: Secondary | ICD-10-CM

## 2018-09-12 HISTORY — DX: Other allergy status, other than to drugs and biological substances: Z91.09

## 2018-09-12 MED ORDER — FLUCONAZOLE 150 MG PO TABS: 150.0000 mg | ORAL_TABLET | Freq: Once | ORAL | 0 refills | Status: AC

## 2018-09-12 MED ORDER — ESTROGENS, CONJUGATED 0.625 MG/GM VA CREA: 1.0000 | TOPICAL_CREAM | Freq: Every day | VAGINAL | 12 refills | Status: DC

## 2018-09-12 MED ORDER — RIFAXIMIN 550 MG PO TABS: 550.0000 mg | ORAL_TABLET | Freq: Three times a day (TID) | ORAL | 0 refills | Status: DC

## 2018-09-12 NOTE — Patient Instructions (Addendum)
Diet for Irritable Bowel Syndrome When you have irritable bowel syndrome (IBS), the foods you eat and your eating habits are very important. IBS may cause various symptoms, such as abdominal pain, constipation, or diarrhea. Choosing the right foods can help ease discomfort caused by these symptoms. Work with your health care provider and dietitian to find the best eating plan to help control your symptoms. What general guidelines do I need to follow?  Keep a food diary. This will help you identify foods that cause symptoms. Write down: ? What you eat and when. ? What symptoms you have. ? When symptoms occur in relation to your meals.  Avoid foods that cause symptoms. Talk with your dietitian about other ways to get the same nutrients that are in these foods.  Eat more foods that contain fiber. Take a fiber supplement if directed by your dietitian.  Eat your meals slowly, in a relaxed setting.  Aim to eat 5-6 small meals per day. Do not skip meals.  Drink enough fluids to keep your urine clear or pale yellow.  Ask your health care provider if you should take an over-the-counter probiotic during flare-ups to help restore healthy gut bacteria.  If you have cramping or diarrhea, try making your meals low in fat and high in carbohydrates. Examples of carbohydrates are pasta, rice, whole grain breads and cereals, fruits, and vegetables.  If dairy products cause your symptoms to flare up, try eating less of them. You might be able to handle yogurt better than other dairy products because it contains bacteria that help with digestion. What foods are not recommended? The following are some foods and drinks that may worsen your symptoms:  Fatty foods, such as French fries.  Milk products, such as cheese or ice cream.  Chocolate.  Alcohol.  Products with caffeine, such as coffee.  Carbonated drinks, such as soda.  The items listed above may not be a complete list of foods and beverages to  avoid. Contact your dietitian for more information. What foods are good sources of fiber? Your health care provider or dietitian may recommend that you eat more foods that contain fiber. Fiber can help reduce constipation and other IBS symptoms. Add foods with fiber to your diet a little at a time so that your body can get used to them. Too much fiber at once might cause gas and swelling of your abdomen. The following are some foods that are good sources of fiber:  Apples.  Peaches.  Pears.  Berries.  Figs.  Broccoli (raw).  Cabbage.  Carrots.  Raw peas.  Kidney beans.  Lima beans.  Whole grain bread.  Whole grain cereal.  Where to find more information: International Foundation for Functional Gastrointestinal Disorders: www.iffgd.org National Institute of Diabetes and Digestive and Kidney Diseases: www.niddk.nih.gov/health-information/health-topics/digestive-diseases/ibs/Pages/facts.aspx This information is not intended to replace advice given to you by your health care provider. Make sure you discuss any questions you have with your health care provider. Document Released: 01/14/2004 Document Revised: 03/31/2016 Document Reviewed: 01/24/2014 Elsevier Interactive Patient Education  2018 Elsevier Inc.  

## 2018-09-12 NOTE — Telephone Encounter (Signed)
Received fax from [employer] that Xifaxan requires a PA. Information has been sent to the insurance company. Awaiting determination.   

## 2018-09-12 NOTE — Progress Notes (Signed)
Subjective:    Patient ID: Janet Richards, female    DOB: 04-14-1961, 57 y.o.   MRN: [id number]  HPI  Patient is a 57 year old female who presents to the clinic to discuss 2 weeks of bloating, gas, loose stools.  She does have a history of acid reflux which she takes Protonix and as needed Tums.  She feels like this is overall controlled.  She has not changed her diet recently.  She denies any melena or hematochezia.  She has had a lot of recent antibiotic exposures over the last year. She does eat yogurt. She admits to some abdominal cramping that usually is helped by a bowel movement.   She does mention testing with allergy and found to have dust mites, trees, mold.   .. Active Ambulatory Problems    Diagnosis Date Noted  . Primary osteoarthritis of both knees 12/27/2012  . Exertional chest pain 02/05/2013  . Abnormal uterine bleeding 02/05/2013  . Essential hypertension 03/01/2013  . No blood products 03/28/2013  . 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 Osteoarthritis of first metatarsophalangeal joint 04/11/2014  . IFG (impaired fasting glucose) 10/17/2014  . Other allergic rhinitis 01/14/2015  . Facial pain 01/14/2015  . Hyperlipidemia 02/20/2015  . History of colonic polyps 03/20/2015  . Paresthesias 07/01/2015  . Right ankle pain 02/24/2016  . Fatigue 02/24/2016  . Anemia, iron deficiency 02/25/2016  . Poor dentition 04/11/2016  . Ganglion cyst of right foot 07/12/2016  . Burning sensation of foot 09/02/2016  . Abnormal ankle brachial index (ABI) 11/09/2016  . Vaginal dryness, menopausal 03/01/2017  . Neuropathy 03/01/2017  . Hypertriglyceridemia 09/21/2017  . Dysfunction of both eustachian tubes 11/26/2017  . Recurrent sinusitis 11/26/2017  . Periodontitis 12/15/2017  . Perimenopausal 01/25/2018  . Urinary urgency 01/25/2018  . DDD (degenerative disc disease), cervical 02/22/2018  . Caregiver stress 03/16/2018  . Large breasts 04/15/2018  . Sinus pressure  05/13/2018  . Post-menopausal bleeding 05/21/2018  . Sensory neuronopathy 07/29/2018  . Mild intermittent asthma without complication 74/16/3845  . Solar urticaria 07/31/2018  . Gastroesophageal reflux disease without esophagitis 07/31/2018  . Environmental allergies 09/12/2018  . Irritable bowel syndrome with diarrhea 09/13/2018   Resolved Ambulatory Problems    Diagnosis Date Noted  . Allergic urticaria 12/27/2012  . Dental abscess 02/05/2013  . Menstrual migraine 02/05/2013  . Right lumbar radiculitis 04/19/2013  . Hyperglycemia 08/05/2013  . Allergic rhinitis 01/22/2014  . Left knee pain 02/25/2014  . Dental infection 03/20/2014  . Allergic sinusitis 01/14/2015  . Sinusitis 06/26/2015  . Chest pain 07/08/2015  . Right knee injury 07/15/2015  . Neck pain 09/01/2015  . UTI (urinary tract infection) 11/25/2015  . Acute upper respiratory infection 12/22/2015  . Low blood potassium 05/24/2016  . Elevated lipase 05/24/2016  . Insect bite 05/25/2016   Past Medical History:  Diagnosis Date  . Asthma   . Essential hypertension, benign   . Urticaria       Review of Systems    see HPI.  Objective:   Physical Exam  Constitutional: She is oriented to person, place, and time. She appears well-developed and well-nourished.  HENT:  Head: Normocephalic and atraumatic.  Cardiovascular: Normal rate and regular rhythm.  Pulmonary/Chest: Effort normal and breath sounds normal.  Abdominal: Soft. Bowel sounds are normal. She exhibits distension. She exhibits no mass. There is no tenderness. There is no rebound and no guarding.  Neurological: She is alert and oriented to person, place, and time.  Psychiatric: She has a  normal mood and affect. Her behavior is normal.          Assessment & Plan:  Marland KitchenMarland KitchenDiagnoses and all orders for this visit:  Irritable bowel syndrome with diarrhea -     rifaximin (XIFAXAN) 550 MG TABS tablet; Take 1 tablet (550 mg total) by mouth 3 (three) times  daily. For 14 days.  Environmental allergies  Vaginal dryness, menopausal -     conjugated estrogens (PREMARIN) vaginal cream; Place 1 Applicatorful vaginally daily. For first 2 weeks then decrease to 3 times a week.  Other orders -     fluconazole (DIFLUCAN) 150 MG tablet; Take 1 tablet (150 mg total) by mouth once for 1 dose.   Pt has had numerous abx over the past I suspect it could have disrupted her normal bowel health. Her symptoms sound like IBS-D. I think she would benefit from xifaxin for 14 days. Consider increasing yogurt or starting a probiotic. Consider food diary to look for symptom triggers. Follow up in 1 month.   Diflucan given if she has yeast infection symptoms after xifaxan.

## 2018-09-13 ENCOUNTER — Encounter: Payer: Self-pay | Admitting: Physician Assistant

## 2018-09-13 ENCOUNTER — Telehealth: Payer: Self-pay

## 2018-09-13 DIAGNOSIS — K580 Irritable bowel syndrome with diarrhea: Secondary | ICD-10-CM | POA: Insufficient documentation

## 2018-09-13 NOTE — Telephone Encounter (Signed)
Elmina called this morning saying that she checked with the pharmacy about the medication you prescribed for her yesterday, and she said that since she has Medicare, they will not cover it. She just wanted to see if there was any other alternative medication? Thanks!

## 2018-09-13 NOTE — Telephone Encounter (Signed)
Received fax that Doreene Nest has been approved through 09/26/2018.Form sent to scan. Pharmacy aware.

## 2018-09-13 NOTE — Telephone Encounter (Signed)
There is nothing quite like that. We can try bentyl an antispasmodic before 2 biggest meals. If ok with trying can send bentyl 10mg  bid #[id number] 2 refills. Make sure getting plenty of yogurt or probiotic.

## 2018-09-14 NOTE — Telephone Encounter (Signed)
Called and left a voicemail for patient to call back to obtain information/results. Call back information provided.

## 2018-09-18 ENCOUNTER — Ambulatory Visit (INDEPENDENT_AMBULATORY_CARE_PROVIDER_SITE_OTHER): Payer: Medicare Other | Admitting: Physician Assistant

## 2018-09-18 ENCOUNTER — Encounter: Payer: Self-pay | Admitting: Physician Assistant

## 2018-09-18 VITALS — BP 124/43 | HR 87 | Ht 63.0 in | Wt 193.0 lb

## 2018-09-18 DIAGNOSIS — H6983 Other specified disorders of Eustachian tube, bilateral: Secondary | ICD-10-CM

## 2018-09-18 MED ORDER — METHYLPREDNISOLONE 4 MG PO TBPK: ORAL_TABLET | ORAL | 0 refills | Status: DC

## 2018-09-18 NOTE — Patient Instructions (Addendum)
Eustachian Tube Dysfunction The eustachian tube connects the middle ear to the back of the nose. It regulates air pressure in the middle ear by allowing air to move between the ear and nose. It also helps to drain fluid from the middle ear space. When the eustachian tube does not function properly, air pressure, fluid, or both can build up in the middle ear. Eustachian tube dysfunction can affect one or both ears. What are the causes? This condition happens when the eustachian tube becomes blocked or cannot open normally. This may result from:  Ear infections.  Colds and other upper respiratory infections.  Allergies.  Irritation, such as from cigarette smoke or acid from the stomach coming up into the esophagus (gastroesophageal reflux).  Sudden changes in air pressure, such as from descending in an airplane.  Abnormal growths in the nose or throat, such as nasal polyps, tumors, or enlarged tissue at the back of the throat (adenoids).  What increases the risk? This condition may be more likely to develop in people who smoke and people who are overweight. Eustachian tube dysfunction may also be more likely to develop in children, especially children who have:  Certain birth defects of the mouth, such as cleft palate.  Large tonsils and adenoids.  What are the signs or symptoms? Symptoms of this condition may include:  A feeling of fullness in the ear.  Ear pain.  Clicking or popping noises in the ear.  Ringing in the ear.  Hearing loss.  Loss of balance.  Symptoms may get worse when the air pressure around you changes, such as when you travel to an area of high elevation or fly on an airplane. How is this diagnosed? This condition may be diagnosed based on:  Your symptoms.  A physical exam of your ear, nose, and throat.  Tests, such as those that measure: ? The movement of your eardrum (tympanogram). ? Your hearing (audiometry).  How is this treated? Treatment  depends on the cause and severity of your condition. If your symptoms are mild, you may be able to relieve your symptoms by moving air into ("popping") your ears. If you have symptoms of fluid in your ears, treatment may include:  Decongestants.  Antihistamines.  Nasal sprays or ear drops that contain medicines that reduce swelling (steroids).  In some cases, you may need to have a procedure to drain the fluid in your eardrum (myringotomy). In this procedure, a small tube is placed in the eardrum to:  Drain the fluid.  Restore the air in the middle ear space.  Follow these instructions at home:  Take over-the-counter and prescription medicines only as told by your health care provider.  Use techniques to help pop your ears as recommended by your health care provider. These may include: ? Chewing gum. ? Yawning. ? Frequent, forceful swallowing. ? Closing your mouth, holding your nose closed, and gently blowing as if you are trying to blow air out of your nose.  Do not do any of the following until your health care provider approves: ? Travel to high altitudes. ? Fly in airplanes. ? Work in a pressurized cabin or room. ? Scuba dive.  Keep your ears dry. Dry your ears completely after showering or bathing.  Do not smoke.  Keep all follow-up visits as told by your health care provider. This is important. Contact a health care provider if:  Your symptoms do not go away after treatment.  Your symptoms come back after treatment.  You are   unable to pop your ears.  You have: ? A fever. ? Pain in your ear. ? Pain in your head or neck. ? Fluid draining from your ear.  Your hearing suddenly changes.  You become very dizzy.  You lose your balance. This information is not intended to replace advice given to you by your health care provider. Make sure you discuss any questions you have with your health care provider. Document Released: 11/20/2015 Document Revised: 03/31/2016  Document Reviewed: 11/12/2014 Elsevier Interactive Patient Education  2018 [employer]  

## 2018-09-18 NOTE — Progress Notes (Signed)
Subjective:    Patient ID: Janet Richards, female    DOB: 1961-01-09, 57 y.o.   MRN: [id number]  HPI Patient is a 57 year old female with persistent environmental and seasonal allergies who presents to the clinic with bilateral ear pain.  She denies any fever, chills, sinus pressure.  She does feel a lot of sharp pains and pressure in both ears.  Some days is more persistent and some days is intermittent.  She is taking Zyrtec, Singulair, Atrovent daily.  She does have a history of accumulation of wax in her ears.  She wonders if that could be causing some problems.  She denies any hearing loss.  She denies any ear drainage.  .. Active Ambulatory Problems    Diagnosis Date Noted  . Primary osteoarthritis of both knees 12/27/2012  . Exertional chest pain 02/05/2013  . Abnormal uterine bleeding 02/05/2013  . Essential hypertension 03/01/2013  . No blood products 03/28/2013  . 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 Osteoarthritis of first metatarsophalangeal joint 04/11/2014  . IFG (impaired fasting glucose) 10/17/2014  . Other allergic rhinitis 01/14/2015  . Facial pain 01/14/2015  . Hyperlipidemia 02/20/2015  . History of colonic polyps 03/20/2015  . Paresthesias 07/01/2015  . Right ankle pain 02/24/2016  . Fatigue 02/24/2016  . Anemia, iron deficiency 02/25/2016  . Poor dentition 04/11/2016  . Ganglion cyst of right foot 07/12/2016  . Burning sensation of foot 09/02/2016  . Abnormal ankle brachial index (ABI) 11/09/2016  . Vaginal dryness, menopausal 03/01/2017  . Neuropathy 03/01/2017  . Hypertriglyceridemia 09/21/2017  . Dysfunction of both eustachian tubes 11/26/2017  . Recurrent sinusitis 11/26/2017  . Periodontitis 12/15/2017  . Perimenopausal 01/25/2018  . Urinary urgency 01/25/2018  . DDD (degenerative disc disease), cervical 02/22/2018  . Caregiver stress 03/16/2018  . Large breasts 04/15/2018  . Sinus pressure 05/13/2018  . Post-menopausal bleeding 05/21/2018  .  Sensory neuronopathy 07/29/2018  . Mild intermittent asthma without complication 71/69/6789  . Solar urticaria 07/31/2018  . Gastroesophageal reflux disease without esophagitis 07/31/2018  . Environmental allergies 09/12/2018  . Irritable bowel syndrome with diarrhea 09/13/2018   Resolved Ambulatory Problems    Diagnosis Date Noted  . Allergic urticaria 12/27/2012  . Dental abscess 02/05/2013  . Menstrual migraine 02/05/2013  . Right lumbar radiculitis 04/19/2013  . Hyperglycemia 08/05/2013  . Allergic rhinitis 01/22/2014  . Left knee pain 02/25/2014  . Dental infection 03/20/2014  . Allergic sinusitis 01/14/2015  . Sinusitis 06/26/2015  . Chest pain 07/08/2015  . Right knee injury 07/15/2015  . Neck pain 09/01/2015  . UTI (urinary tract infection) 11/25/2015  . Acute upper respiratory infection 12/22/2015  . Low blood potassium 05/24/2016  . Elevated lipase 05/24/2016  . Insect bite 05/25/2016   Past Medical History:  Diagnosis Date  . Asthma   . Essential hypertension, benign   . Urticaria       Review of Systems See HPI.     Objective:   Physical Exam  Constitutional: She is oriented to person, place, and time. She appears well-developed and well-nourished.  HENT:  Head: Normocephalic and atraumatic.  Right Ear: External ear normal.  Left Ear: External ear normal.  Nose: Nose normal.  Mouth/Throat: Oropharynx is clear and moist. No oropharyngeal exudate.  TM's with some scant wax in canal. Effusion at base of both TM's. No erythema or pus.   Eyes: Pupils are equal, round, and reactive to light. Conjunctivae and EOM are normal.  Neck: Normal range of motion. Neck supple.  Cardiovascular:  Normal rate and regular rhythm.  Pulmonary/Chest: Effort normal and breath sounds normal.  Lymphadenopathy:    She has no cervical adenopathy.  Neurological: She is alert and oriented to person, place, and time.  Psychiatric: She has a normal mood and affect. Her behavior is  normal.          Assessment & Plan:  Marland KitchenMarland KitchenDiagnoses and all orders for this visit:  ETD (Eustachian tube dysfunction), bilateral -     methylPREDNISolone (MEDROL DOSEPAK) 4 MG TBPK tablet; Take as directed by package insert.   Discussed no signs of infection. There was some scant wax but no impaction. She is already on zyrtec, singulair, atrovent nasal spray. Will send medrol dose pack. Follow up as needed.

## 2018-09-26 ENCOUNTER — Ambulatory Visit (INDEPENDENT_AMBULATORY_CARE_PROVIDER_SITE_OTHER): Payer: Medicare Other | Admitting: Physician Assistant

## 2018-09-26 ENCOUNTER — Encounter: Payer: Self-pay | Admitting: Physician Assistant

## 2018-09-26 VITALS — BP 127/68 | HR 90 | Ht 63.0 in | Wt 193.0 lb

## 2018-09-26 DIAGNOSIS — R202 Paresthesia of skin: Secondary | ICD-10-CM

## 2018-09-26 DIAGNOSIS — R51 Headache: Secondary | ICD-10-CM | POA: Diagnosis not present

## 2018-09-26 DIAGNOSIS — R519 Headache, unspecified: Secondary | ICD-10-CM

## 2018-09-26 MED ORDER — CYANOCOBALAMIN 1000 MCG/ML IJ SOLN
1000.0000 ug | Freq: Once | INTRAMUSCULAR | Status: AC
  Administered 2018-09-26: 1000 ug via INTRAMUSCULAR

## 2018-09-26 NOTE — Patient Instructions (Signed)
b12 shot today.   Keep monitoring headaches. Ibuprofen and tylenol as needed.

## 2018-09-26 NOTE — Progress Notes (Addendum)
Subjective:    Patient ID: Janet Richards, female    DOB: 1961-10-26, 57 y.o.   MRN: [id number]  HPI  Pt is a 57 you female who presents to the clinic with intermittent headaches for last few months. She admits she is more concerned with headaches because her husband had a brain aneurysm. She denies any head pressure, nausea, vomiting, light sensitivity, vision changes. Describes headache as dull and intermittent. She rates 2/10 on pain scale. She does have a history of allergies and see allergist. She is on multiple allergy medications. She just finished a medrol dose pack for ETDShe denies any excessive caffeine. No known triggers.   Her numbness and tingling of feet have improved with b12 supplementation and gabapentin. She is doing well. She request a b12 shot.   .. Active Ambulatory Problems    Diagnosis Date Noted  . Primary osteoarthritis of both knees 12/27/2012  . Exertional chest pain 02/05/2013  . Abnormal uterine bleeding 02/05/2013  . Essential hypertension 03/01/2013  . No blood products 03/28/2013  . 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 Osteoarthritis of first metatarsophalangeal joint 04/11/2014  . IFG (impaired fasting glucose) 10/17/2014  . Other allergic rhinitis 01/14/2015  . Facial pain 01/14/2015  . Hyperlipidemia 02/20/2015  . History of colonic polyps 03/20/2015  . Paresthesias 07/01/2015  . Right ankle pain 02/24/2016  . Fatigue 02/24/2016  . Anemia, iron deficiency 02/25/2016  . Poor dentition 04/11/2016  . Ganglion cyst of right foot 07/12/2016  . Burning sensation of foot 09/02/2016  . Abnormal ankle brachial index (ABI) 11/09/2016  . Vaginal dryness, menopausal 03/01/2017  . Neuropathy 03/01/2017  . Hypertriglyceridemia 09/21/2017  . Dysfunction of both eustachian tubes 11/26/2017  . Recurrent sinusitis 11/26/2017  . Periodontitis 12/15/2017  . Perimenopausal 01/25/2018  . Urinary urgency 01/25/2018  . DDD (degenerative disc disease), cervical  02/22/2018  . Caregiver stress 03/16/2018  . Large breasts 04/15/2018  . Sinus pressure 05/13/2018  . Post-menopausal bleeding 05/21/2018  . Sensory neuronopathy 07/29/2018  . Mild intermittent asthma without complication 61/44/3154  . Solar urticaria 07/31/2018  . Gastroesophageal reflux disease without esophagitis 07/31/2018  . Environmental allergies 09/12/2018  . Irritable bowel syndrome with diarrhea 09/13/2018   Resolved Ambulatory Problems    Diagnosis Date Noted  . Allergic urticaria 12/27/2012  . Dental abscess 02/05/2013  . Menstrual migraine 02/05/2013  . Right lumbar radiculitis 04/19/2013  . Hyperglycemia 08/05/2013  . Allergic rhinitis 01/22/2014  . Left knee pain 02/25/2014  . Dental infection 03/20/2014  . Allergic sinusitis 01/14/2015  . Sinusitis 06/26/2015  . Chest pain 07/08/2015  . Right knee injury 07/15/2015  . Neck pain 09/01/2015  . UTI (urinary tract infection) 11/25/2015  . Acute upper respiratory infection 12/22/2015  . Low blood potassium 05/24/2016  . Elevated lipase 05/24/2016  . Insect bite 05/25/2016   Past Medical History:  Diagnosis Date  . Asthma   . Essential hypertension, benign   . Urticaria      .   Review of Systems See HPI.     Objective:   Physical Exam  Constitutional: She is oriented to person, place, and time. She appears well-developed and well-nourished.  HENT:  Head: Normocephalic and atraumatic.  No tenderness over temples bilaterally.  Eyes: Conjunctivae are normal. Right eye exhibits no discharge. Left eye exhibits no discharge.  Cardiovascular: Normal rate and regular rhythm.  Pulmonary/Chest: Effort normal and breath sounds normal.  Neurological: She is alert and oriented to person, place, and time.  Psychiatric:  She has a normal mood and affect. Her behavior is normal.          Assessment & Plan:  Marland KitchenMarland KitchenDiagnoses and all orders for this visit:  Acute nonintractable headache, unspecified headache  type  Paresthesias -     cyanocobalamin ((VITAMIN B-12)) injection 1,000 mcg   Unclear cause of headache. Does not present like a migraine. No temple tenderness to suggest temporal arteritis. No red flags on aneurysm.BP is great. Neuro exam is intact.  I think most likely due to allergies. Continue with nasal sprays flonase and or atrovent. She is on anti-histamines already.   b12 shot given today. Continue with gabapentin. Use OTC b12 supplementation for the next 2 months then check b12 lab to see if more IM shots are needed.

## 2018-10-08 ENCOUNTER — Other Ambulatory Visit: Payer: Self-pay | Admitting: Physician Assistant

## 2018-10-09 ENCOUNTER — Encounter: Payer: Self-pay | Admitting: Physician Assistant

## 2018-10-09 ENCOUNTER — Ambulatory Visit (INDEPENDENT_AMBULATORY_CARE_PROVIDER_SITE_OTHER): Payer: Medicare Other | Admitting: Physician Assistant

## 2018-10-09 VITALS — BP 140/66 | HR 91 | Ht 63.0 in | Wt 189.0 lb

## 2018-10-09 DIAGNOSIS — L509 Urticaria, unspecified: Secondary | ICD-10-CM

## 2018-10-09 MED ORDER — METHYLPREDNISOLONE 4 MG PO TBPK: ORAL_TABLET | ORAL | 0 refills | Status: DC

## 2018-10-09 NOTE — Patient Instructions (Signed)
Hives Hives (urticaria) are itchy, red, swollen areas on your skin. Hives can show up on any part of your body, and they can vary in size. They can be as small as the tip of a pen or much larger. Hives often fade within 24 hours (acute hives). In other cases, new hives show up after old ones fade. This can continue for many days or weeks (chronic hives). Hives are caused by your body's reaction to an irritant or to something that you are allergic to (trigger). You can get hives right after being around a trigger or hours later. Hives do not spread from person to person (are not contagious). Hives may get worse if you scratch them, if you exercise, or if you have worries (emotional stress). Follow these instructions at home: Medicines  Take or apply over-the-counter and prescription medicines only as told by your doctor.  If you were prescribed an antibiotic medicine, use it as told by your doctor. Do not stop taking the antibiotic even if you start to feel better. Skin Care  Apply cool, wet cloths (cool compresses) to the itchy, red, swollen areas.  Do not scratch your skin. Do not rub your skin. General instructions  Do not take hot showers or baths. This can make itching worse.  Do not wear tight clothes.  Use sunscreen and wear clothing that covers your skin when you are outside.  Avoid any triggers that cause your hives. Keep a journal to help you keep track of what causes your hives. Write down: ? What medicines you take. ? What you eat and drink. ? What products you use on your skin.  Keep all follow-up visits as told by your doctor. This is important. Contact a doctor if:  Your symptoms are not better with medicine.  Your joints are painful or swollen. Get help right away if:  You have a fever.  You have belly pain.  Your tongue or lips are swollen.  Your eyelids are swollen.  Your chest or throat feels tight.  You have trouble breathing or swallowing. These  symptoms may be an emergency. Do not wait to see if the symptoms will go away. Get medical help right away. Call your local emergency services (911 in the [place] Do not drive yourself to the hospital. This information is not intended to replace advice given to you by your health care provider. Make sure you discuss any questions you have with your health care provider. Document Released: 08/02/2008 Document Revised: 03/31/2016 Document Reviewed: 08/12/2015 Elsevier Interactive Patient Education  2018 [employer] Inc[employer]  

## 2018-10-09 NOTE — Progress Notes (Signed)
Subjective:    Patient ID: Janet Richards, female    DOB: 1961/02/18, 57 y.o.   MRN: [id number]  HPI  Patient is a 57 year old female with a history of cholergenic urticaria who presents to the clinic after she noticed to have on the back of her neck this weekend.  She denies any shortness of breath, wheezing, lip swelling, tongue swelling, problems swallowing.  She is not aware of any trigger other than her ongoing hot flashes.  She did some cool compresses and placed some topical hydrocortisone cream over the area.  She took some Benadryl every night.  He has pretty improved significantly.  He continues to itch some but no pain. Pt takes singulair and zyrtec daily. No other area of body is effected.   ... Active Ambulatory Problems    Diagnosis Date Noted  . Primary osteoarthritis of both knees 12/27/2012  . Exertional chest pain 02/05/2013  . Abnormal uterine bleeding 02/05/2013  . Essential hypertension 03/01/2013  . No blood products 03/28/2013  . 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 Osteoarthritis of first metatarsophalangeal joint 04/11/2014  . IFG (impaired fasting glucose) 10/17/2014  . Other allergic rhinitis 01/14/2015  . Facial pain 01/14/2015  . Hyperlipidemia 02/20/2015  . History of colonic polyps 03/20/2015  . Paresthesias 07/01/2015  . Right ankle pain 02/24/2016  . Fatigue 02/24/2016  . Anemia, iron deficiency 02/25/2016  . Poor dentition 04/11/2016  . Ganglion cyst of right foot 07/12/2016  . Burning sensation of foot 09/02/2016  . Abnormal ankle brachial index (ABI) 11/09/2016  . Vaginal dryness, menopausal 03/01/2017  . Neuropathy 03/01/2017  . Hypertriglyceridemia 09/21/2017  . Dysfunction of both eustachian tubes 11/26/2017  . Recurrent sinusitis 11/26/2017  . Periodontitis 12/15/2017  . Perimenopausal 01/25/2018  . Urinary urgency 01/25/2018  . DDD (degenerative disc disease), cervical 02/22/2018  . Caregiver stress 03/16/2018  . Large breasts  04/15/2018  . Sinus pressure 05/13/2018  . Post-menopausal bleeding 05/21/2018  . Sensory neuronopathy 07/29/2018  . Mild intermittent asthma without complication 40/98/1191  . Solar urticaria 07/31/2018  . Gastroesophageal reflux disease without esophagitis 07/31/2018  . Environmental allergies 09/12/2018  . Irritable bowel syndrome with diarrhea 09/13/2018   Resolved Ambulatory Problems    Diagnosis Date Noted  . Allergic urticaria 12/27/2012  . Dental abscess 02/05/2013  . Menstrual migraine 02/05/2013  . Right lumbar radiculitis 04/19/2013  . Hyperglycemia 08/05/2013  . Allergic rhinitis 01/22/2014  . Left knee pain 02/25/2014  . Dental infection 03/20/2014  . Allergic sinusitis 01/14/2015  . Sinusitis 06/26/2015  . Chest pain 07/08/2015  . Right knee injury 07/15/2015  . Neck pain 09/01/2015  . UTI (urinary tract infection) 11/25/2015  . Acute upper respiratory infection 12/22/2015  . Low blood potassium 05/24/2016  . Elevated lipase 05/24/2016  . Insect bite 05/25/2016   Past Medical History:  Diagnosis Date  . Asthma   . Essential hypertension, benign   . Urticaria         Review of Systems See HPI.     Objective:   Physical Exam  Constitutional: She is oriented to person, place, and time. She appears well-developed and well-nourished.  HENT:  Head: Normocephalic and atraumatic.  Tongue, lips, uvula, oropharynx normal.   Cardiovascular: Normal rate and regular rhythm.  Pulmonary/Chest: Effort normal and breath sounds normal.  Neurological: She is alert and oriented to person, place, and time.  Skin:  Back of neck where hive was previously noted no evidence of hive. There was some appearance of swelling  in this area. No erythema or induration.   Psychiatric: She has a normal mood and affect. Her behavior is normal.          Assessment & Plan:  Marland KitchenMarland KitchenDiagnoses and all orders for this visit:  Urticaria -     methylPREDNISolone (MEDROL DOSEPAK) 4 MG  TBPK tablet; Take as directed by package insert.  Patient does have a history of urticaria.  She is on Zyrtec, Singulair, nasal sprays, Benadryl as needed.  I reassured her that I feel like there is no evidence of hives today.  Think this has resolved on its own it was symptomatic treatment given by the patient.  Due to her history I did give her a Medrol Dosepak to keep on hand for any future exacerbations.  Her hives are triggered by sweating.  Encouraged her to keep area cool with ice packs and to take great precautions and sweating.

## 2018-10-10 ENCOUNTER — Encounter: Payer: Self-pay | Admitting: Physician Assistant

## 2018-10-11 ENCOUNTER — Other Ambulatory Visit: Payer: Self-pay

## 2018-10-11 MED ORDER — PANTOPRAZOLE SODIUM 40 MG PO TBEC: DELAYED_RELEASE_TABLET | ORAL | 1 refills | Status: DC

## 2018-10-17 ENCOUNTER — Encounter: Payer: Self-pay | Admitting: Family Medicine

## 2018-10-17 ENCOUNTER — Ambulatory Visit (INDEPENDENT_AMBULATORY_CARE_PROVIDER_SITE_OTHER): Payer: Medicare Other | Admitting: Family Medicine

## 2018-10-17 VITALS — BP 126/85 | HR 88 | Wt 190.0 lb

## 2018-10-17 DIAGNOSIS — L505 Cholinergic urticaria: Secondary | ICD-10-CM | POA: Diagnosis not present

## 2018-10-17 DIAGNOSIS — R7401 Elevation of levels of liver transaminase levels: Secondary | ICD-10-CM

## 2018-10-17 DIAGNOSIS — R109 Unspecified abdominal pain: Secondary | ICD-10-CM

## 2018-10-17 DIAGNOSIS — R6889 Other general symptoms and signs: Secondary | ICD-10-CM | POA: Diagnosis not present

## 2018-10-17 DIAGNOSIS — R740 Nonspecific elevation of levels of transaminase and lactic acid dehydrogenase [LDH]: Secondary | ICD-10-CM

## 2018-10-17 NOTE — Progress Notes (Signed)
Janet Richards is a 57 y.o. female who presents to [facility]: [place] today for abdominal discomfort and follow-up abnormal ABI.  Janet Richards notes a long several months to year history of intermittent abdominal discomfort.  She notes occasional cramping occurring in multiple locations across her abdomen.  She has had some preliminary work-up already that was largely unremarkable.  Her last abdominal ultrasound was in 2015.  She notes occasional nausea and occasional sensation or diarrhea.  She denies any blood in the stool.  She is not tried much treatment yet.  Additionally she notes that she had an abnormal home screening ABI about 2 years ago.  This was identified in January 2018.  I ordered an ABI and vascular ultrasound to follow this up however it was never done.  She notes that she would like to follow this issue up now.  She notes that she is not having any significant leg claudication or pain.   ROS as above:  Exam:  BP 126/85   Pulse 88   Wt 190 lb (86.2 kg)   LMP 09/24/2017   BMI 33.66 kg/m  Wt Readings from Last 5 Encounters:  10/17/18 190 lb (86.2 kg)  10/09/18 189 lb (85.7 kg)  09/26/18 193 lb (87.5 kg)  09/18/18 193 lb (87.5 kg)  09/12/18 193 lb (87.5 kg)    Gen: Well NAD HEENT: EOMI,  MMM Lungs: Normal work of breathing. CTABL Heart: RRR no MRG Abd: NABS, Soft. Nondistended, Nontender no rebound or guarding Exts: Brisk capillary refill, warm and well perfused.   Lab and Radiology Results No results found for this or any previous visit (from the past 72 hour(s)). No results found.    Assessment and Plan: 57 y.o. female with abdominal cramping and discomfort: Unclear etiology.  Think is reasonable proceed with limited work-up.  Will check labs as below.  If abnormal neck step would likely be abdominal ultrasound.  I think it is likely that at the  end of her work-up we discover that she has IBS however it is too early to tell at this point.  Her symptoms are quite mild and after discussion of possible medical options we both decided that holding off on medicines at this point are reasonable.  Additionally I think is reasonable to follow-up that abnormal ABI from about 2 years ago.  Plan for vascular ultrasound and ABI.  Fortunately patient does not have significant symptoms.   Orders Placed This Encounter  Procedures  . CBC with Differential/Platelet  . COMPLETE METABOLIC PANEL WITH GFR  . Lipase   No orders of the defined types were placed in this encounter.    Historical information moved to improve visibility of documentation.  Past Medical History:  Diagnosis Date  . Asthma   . Essential hypertension, benign   . Menstrual migraine   . Periodontitis   . Primary osteoarthritis of both knees   . Sinusitis   . Urticaria    Past Surgical History:  Procedure Laterality Date  . TUBAL LIGATION  12/26/1987   Social History   Tobacco Use  . Smoking status: Former Smoker    Packs/day: 1.50    Types: Cigarettes    Last attempt to quit: 02/05/2006    Years since quitting: 12.7  . Smokeless tobacco: Never Used  Substance Use Topics  . Alcohol use: Yes    Comment: rarely   family history includes Allergic rhinitis in her father and  son; Asthma in her son; CAD in her father; Hypertension in her mother; Urticaria in her sister.  Medications: Current Outpatient Medications  Medication Sig Dispense Refill  . cetirizine HCl (ZYRTEC) 1 MG/ML solution TAKE 10 MLS (10 MG TOTAL) BY MOUTH DAILY. 944 mL 3  . conjugated estrogens (PREMARIN) vaginal cream Place 1 Applicatorful vaginally daily. For first 2 weeks then decrease to 3 times a week. 42.5 g 12  . cyclobenzaprine (FLEXERIL) 10 MG tablet Take 1 tablet (10 mg total) by mouth 3 (three) times daily as needed for muscle spasms. 30 tablet 0  . hydrochlorothiazide (HYDRODIURIL) 25  MG tablet TAKE 1 TABLET BY MOUTH EVERY DAY 90 tablet 1  . ipratropium (ATROVENT) 0.06 % nasal spray Place 2 sprays into both nostrils 4 (four) times daily.    . Ipratropium-Albuterol (COMBIVENT) 20-100 MCG/ACT AERS respimat Inhale 1 puff into the lungs every 6 (six) hours as needed for wheezing or shortness of breath. 1 Inhaler 1  . IRON, FERROUS SULFATE, PO Take by mouth.    . montelukast (SINGULAIR) 10 MG tablet Take 1 tablet once a day for coughing or wheezing. 34 tablet 5  . Multiple Vitamins-Minerals (MULTIVITAMIN ADULT PO) Take by mouth.    . Olopatadine HCl 0.2 % SOLN     . pantoprazole (PROTONIX) 40 MG tablet TAKE 1 TABLET (40 MG TOTAL) BY MOUTH DAILY. 90 tablet 1  . Probiotic Product (PROBIOTIC-10) CAPS Take by mouth.     No current facility-administered medications for this visit.    Allergies  Allergen Reactions  . Azithromycin Nausea And Vomiting  . Clindamycin/Lincomycin Hives    Facial swelling  . Penicillins Swelling and Other (See Comments)    Patient states does not work for her.  She denies swelling or hives from penicillin. She states penicillin not effective for infections. Has taken cephalosporins without problems  . Sulfa Antibiotics Hives     Discussed warning signs or symptoms. Please see discharge instructions. Patient expresses understanding.

## 2018-10-17 NOTE — Patient Instructions (Addendum)
Thank you for coming in today. You should hear about the blood vessel test soon.  Get labs now.  If abnormal may consider ultrasound of the belly next.  Recheck as needed.

## 2018-10-18 ENCOUNTER — Ambulatory Visit (HOSPITAL_BASED_OUTPATIENT_CLINIC_OR_DEPARTMENT_OTHER)
Admission: RE | Admit: 2018-10-18 | Discharge: 2018-10-18 | Disposition: A | Discharge status: Home / Self Care | Payer: Medicare Other | Source: Ambulatory Visit | Attending: Family Medicine | Admitting: Family Medicine

## 2018-10-18 DIAGNOSIS — R6889 Other general symptoms and signs: Secondary | ICD-10-CM | POA: Diagnosis not present

## 2018-10-18 LAB — CBC WITH DIFFERENTIAL/PLATELET
Basophils Absolute: 0 {cells}/uL (ref 0–200)
Basophils Relative: 0 %
Eosinophils Absolute: 172 {cells}/uL (ref 15–500)
Eosinophils Relative: 3.3 %
HCT: 39.5 % (ref 35.0–45.0)
Hemoglobin: 13.3 g/dL (ref 11.7–15.5)
Lymphs Abs: 2740 {cells}/uL (ref 850–3900)
MCH: 30 pg (ref 27.0–33.0)
MCHC: 33.7 g/dL (ref 32.0–36.0)
MCV: 89 fL (ref 80.0–100.0)
MPV: 9.9 fL (ref 7.5–12.5)
Monocytes Relative: 6.8 %
Neutro Abs: 1934 {cells}/uL (ref 1500–7800)
Neutrophils Relative %: 37.2 %
Platelets: 432 Thousand/uL — ABNORMAL HIGH (ref 140–400)
RBC: 4.44 Million/uL (ref 3.80–5.10)
RDW: 13.5 % (ref 11.0–15.0)
Total Lymphocyte: 52.7 %
WBC mixed population: 354 {cells}/uL (ref 200–950)
WBC: 5.2 Thousand/uL (ref 3.8–10.8)

## 2018-10-18 LAB — COMPLETE METABOLIC PANEL WITHOUT GFR
AG Ratio: 1.8 (calc) (ref 1.0–2.5)
ALT: 37 U/L — ABNORMAL HIGH (ref 6–29)
AST: 29 U/L (ref 10–35)
Albumin: 4.6 g/dL (ref 3.6–5.1)
Alkaline phosphatase (APISO): 79 U/L (ref 33–130)
BUN/Creatinine Ratio: 13 (calc) (ref 6–22)
BUN: 14 mg/dL (ref 7–25)
CO2: 30 mmol/L (ref 20–32)
Calcium: 10.1 mg/dL (ref 8.6–10.4)
Chloride: 103 mmol/L (ref 98–110)
Creat: 1.06 mg/dL — ABNORMAL HIGH (ref 0.50–1.05)
GFR, Est African American: 68 mL/min/1.73m2
GFR, Est Non African American: 59 mL/min/1.73m2 — ABNORMAL LOW
Globulin: 2.6 g/dL (ref 1.9–3.7)
Glucose, Bld: 97 mg/dL (ref 65–99)
Potassium: 4 mmol/L (ref 3.5–5.3)
Sodium: 143 mmol/L (ref 135–146)
Total Bilirubin: 0.4 mg/dL (ref 0.2–1.2)
Total Protein: 7.2 g/dL (ref 6.1–8.1)

## 2018-10-18 LAB — LIPASE: Lipase: 25 U/L (ref 7–60)

## 2018-10-18 NOTE — Addendum Note (Signed)
Addended by: [name] on: 10/18/2018 07:13 AM   Modules accepted: Orders

## 2018-10-18 NOTE — Progress Notes (Signed)
ABI Arterial Doppler Evaluation   Right: Resting right ankle-brachial index is within normal range. No evidence of significant right lower extremity arterial disease. The right toe-brachial index is normal.   Left: Resting left ankle-brachial index is within normal range. No evidence of significant left lower extremity arterial disease. The left toe-brachial index is normal.    10/18/18 Cardell Peach RDCS, RVT

## 2018-10-22 ENCOUNTER — Telehealth: Payer: Self-pay | Admitting: Physician Assistant

## 2018-10-22 DIAGNOSIS — R7989 Other specified abnormal findings of blood chemistry: Secondary | ICD-10-CM

## 2018-10-22 DIAGNOSIS — E538 Deficiency of other specified B group vitamins: Secondary | ICD-10-CM

## 2018-10-22 NOTE — Telephone Encounter (Signed)
Pt walked into clinic today for B12 injection. Per last OV note:   09/26/18:"b12 shot given today. Continue with gabapentin. Use OTC b12 supplementation for the next 2 months then check b12 lab to see if more IM shots are needed."  Pt will continue oral B12, and get labs rechecked in Jan 2020.

## 2018-10-23 ENCOUNTER — Ambulatory Visit: Payer: Self-pay

## 2018-11-14 ENCOUNTER — Encounter: Payer: Self-pay | Admitting: Family Medicine

## 2018-11-14 ENCOUNTER — Ambulatory Visit (INDEPENDENT_AMBULATORY_CARE_PROVIDER_SITE_OTHER): Payer: Medicare Other | Admitting: Family Medicine

## 2018-11-14 VITALS — BP 132/64 | HR 77 | Wt 189.0 lb

## 2018-11-14 DIAGNOSIS — R350 Frequency of micturition: Secondary | ICD-10-CM

## 2018-11-14 DIAGNOSIS — R7303 Prediabetes: Secondary | ICD-10-CM

## 2018-11-14 DIAGNOSIS — R5383 Other fatigue: Secondary | ICD-10-CM

## 2018-11-14 LAB — POCT URINALYSIS DIPSTICK
Bilirubin, UA: NEGATIVE
Blood, UA: NEGATIVE
Glucose, UA: NEGATIVE
Ketones, UA: NEGATIVE
Leukocytes, UA: NEGATIVE
Nitrite, UA: NEGATIVE
Protein, UA: NEGATIVE
Spec Grav, UA: 1.01 (ref 1.010–1.025)
Urobilinogen, UA: 0.2 E.U./dL
pH, UA: 7 (ref 5.0–8.0)

## 2018-11-14 LAB — GLUCOSE, POCT (MANUAL RESULT ENTRY): POC Glucose: 144 mg/dl — AB (ref 70–99)

## 2018-11-14 NOTE — Progress Notes (Signed)
Janet Richards is a 58 y.o. female who presents to [facility]: [place] today for fatigue and a bit of diarrhea.  Symptoms present for few days.  Patient notes mild diarrhea with several stools per day.  She notes associated fatigue and generally not feeling very well.  She denies fevers or vomiting.  She notes her stool was slightly green-colored but she thinks that was due to the food coloring in the airborne over-the-counter treatment that she was trying to help her initial illness.  She is not tried much for her diarrhea.  She denies significant abdominal pain.  She denies cough congestion or runny nose.  She does note a bit of urinary frequency but denies dysuria or urgency.  She has a past medical history significant for prediabetes.  A1c was 6.3 about 2 years ago.   ROS as above:  Exam:  BP 132/64   Pulse 77   Wt 189 lb (85.7 kg)   LMP 09/24/2017   BMI 33.48 kg/m  Wt Readings from Last 5 Encounters:  11/14/18 189 lb (85.7 kg)  10/17/18 190 lb (86.2 kg)  10/09/18 189 lb (85.7 kg)  09/26/18 193 lb (87.5 kg)  09/18/18 193 lb (87.5 kg)    Gen: Well NAD HEENT: EOMI,  MMM no cervical lymphadenopathy Lungs: Normal work of breathing. CTABL Heart: RRR no MRG Abd: NABS, Soft. Nondistended, Nontender Exts: Brisk capillary refill, warm and well perfused.   Lab and Radiology Results Results for orders placed or performed in visit on 11/14/18 (from the past 72 hour(s))  POCT Urinalysis Dipstick     Status: None   Collection Time: 11/14/18 10:08 AM  Result Value Ref Range   Color, UA YELLOW    Clarity, UA CLEAR    Glucose, UA Negative Negative   Bilirubin, UA NEGATIVE    Ketones, UA NEGATIVE    Spec Grav, UA 1.010 1.010 - 1.025   Blood, UA NEGATIVE    pH, UA 7.0 5.0 - 8.0   Protein, UA Negative Negative   Urobilinogen, UA 0.2 0.2 or 1.0 E.U./dL   Nitrite, UA NEGATIVE     Leukocytes, UA Negative Negative   Appearance     Odor    POCT glucose (manual entry)     Status: Abnormal   Collection Time: 11/14/18 10:08 AM  Result Value Ref Range   POC Glucose 144 (A) 70 - 99 mg/dl   No results found.    Chemistry      Component Value Date/Time   NA 143 10/17/2018 1602   K 4.0 10/17/2018 1602   CL 103 10/17/2018 1602   CO2 30 10/17/2018 1602   BUN 14 10/17/2018 1602   CREATININE 1.06 (H) 10/17/2018 1602      Component Value Date/Time   CALCIUM 10.1 10/17/2018 1602   ALKPHOS 64 04/01/2017 1100   AST 29 10/17/2018 1602   ALT 37 (H) 10/17/2018 1602   BILITOT 0.4 10/17/2018 1602     Lab Results  Component Value Date   TSH 2.30 05/21/2018   Lab Results  Component Value Date   HGBA1C 6.3 (H) 12/09/2015     Assessment and Plan: 58 y.o. female with general fatigue along with mild diarrhea.  I believe patient has a viral gastroenteritis.  Plan to proceed with watchful waiting and symptomatic management.  Recommend Imodium and Tylenol.  Nonfasting random blood sugar today was 144.  Given history of prediabetes with an A1c  of 6.32 years ago I do think the next time she gets lab is probably reasonable to recheck A1c.  Although I do not think that prediabetes is affecting her symptoms today.  Recommend patient schedule for wellness visit with PCP in the near future.  Otherwise watchful waiting recheck with me as needed.  Urinalysis normal today UTI very unlikely.   PDMP not reviewed this encounter. Orders Placed This Encounter  Procedures  . POCT Urinalysis Dipstick  . POCT glucose (manual entry)   No orders of the defined types were placed in this encounter.    Historical information moved to improve visibility of documentation.  Past Medical History:  Diagnosis Date  . Asthma   . Essential hypertension, benign   . Menstrual migraine   . Periodontitis   . Prediabetes 10/17/2014  . Primary osteoarthritis of both knees   . Sinusitis   .  Urticaria    Past Surgical History:  Procedure Laterality Date  . TUBAL LIGATION  12/26/1987   Social History   Tobacco Use  . Smoking status: Former Smoker    Packs/day: 1.50    Types: Cigarettes    Last attempt to quit: 02/05/2006    Years since quitting: 12.7  . Smokeless tobacco: Never Used  Substance Use Topics  . Alcohol use: Yes    Comment: rarely   family history includes Allergic rhinitis in her father and son; Asthma in her son; CAD in her father; Hypertension in her mother; Urticaria in her sister.  Medications: Current Outpatient Medications  Medication Sig Dispense Refill  . cetirizine HCl (ZYRTEC) 1 MG/ML solution TAKE 10 MLS (10 MG TOTAL) BY MOUTH DAILY. 944 mL 3  . conjugated estrogens (PREMARIN) vaginal cream Place 1 Applicatorful vaginally daily. For first 2 weeks then decrease to 3 times a week. 42.5 g 12  . cyclobenzaprine (FLEXERIL) 10 MG tablet Take 1 tablet (10 mg total) by mouth 3 (three) times daily as needed for muscle spasms. 30 tablet 0  . hydrochlorothiazide (HYDRODIURIL) 25 MG tablet TAKE 1 TABLET BY MOUTH EVERY DAY 90 tablet 1  . ipratropium (ATROVENT) 0.06 % nasal spray Place 2 sprays into both nostrils 4 (four) times daily.    . Ipratropium-Albuterol (COMBIVENT) 20-100 MCG/ACT AERS respimat Inhale 1 puff into the lungs every 6 (six) hours as needed for wheezing or shortness of breath. 1 Inhaler 1  . IRON, FERROUS SULFATE, PO Take by mouth.    . montelukast (SINGULAIR) 10 MG tablet Take 1 tablet once a day for coughing or wheezing. 34 tablet 5  . Multiple Vitamins-Minerals (MULTIVITAMIN ADULT PO) Take by mouth.    . Olopatadine HCl 0.2 % SOLN     . pantoprazole (PROTONIX) 40 MG tablet TAKE 1 TABLET (40 MG TOTAL) BY MOUTH DAILY. 90 tablet 1  . Probiotic Product (PROBIOTIC-10) CAPS Take by mouth.     No current facility-administered medications for this visit.    Allergies  Allergen Reactions  . Azithromycin Nausea And Vomiting  .  Clindamycin/Lincomycin Hives    Facial swelling  . Penicillins Swelling and Other (See Comments)    Patient states does not work for her.  She denies swelling or hives from penicillin. She states penicillin not effective for infections. Has taken cephalosporins without problems  . Sulfa Antibiotics Hives     Discussed warning signs or symptoms. Please see discharge instructions. Patient expresses understanding.

## 2018-11-14 NOTE — Patient Instructions (Addendum)
Thank you for coming in today. I think you are getting a virus illness.  For symptom control use over the counter imodium.  Tylenol and Aspirin can help.  Aspirin may irritate your stomach.  Max dose of tylenol is 1000mg  every 6 hours.   I do recommend scheduling for [facility] adult visit.  Recheck with me as needed.    Viral Gastroenteritis, Adult  Viral gastroenteritis is also known as the stomach flu. This condition is caused by certain germs (viruses). These germs can be passed from person to person very easily (are very contagious). This condition can cause sudden watery poop (diarrhea), fever, and throwing up (vomiting). Having watery poop and throwing up can make you feel weak and cause you to get dehydrated. Dehydration can make you tired and thirsty, make you have a dry mouth, and make it so you pee (urinate) less often. Older adults and people with other diseases or a weak defense system (immune system) are at higher risk for dehydration. It is important to replace the fluids that you lose from having watery poop and throwing up. Follow these instructions at home: Follow instructions from your doctor about how to care for yourself at home. Eating and drinking Follow these instructions as told by your doctor:  Take an oral rehydration solution (ORS). This is a drink that is sold at pharmacies and stores.  Drink clear fluids in small amounts as you are able, such as: ? Water. ? Ice chips. ? Diluted fruit juice. ? Low-calorie sports drinks.  Eat bland, easy-to-digest foods in small amounts as you are able, such as: ? Bananas. ? Applesauce. ? Rice. ? Low-fat (lean) meats. ? Toast. ? Crackers.  Avoid fluids that have a lot of sugar or caffeine in them.  Avoid alcohol.  Avoid spicy or fatty foods. General instructions   Drink enough fluid to keep your pee (urine) clear or pale yellow.  Wash your hands often. If you cannot use soap and water, use hand  sanitizer.  Make sure that all people in your home wash their hands well and often.  Rest at home while you get better.  Take over-the-counter and prescription medicines only as told by your doctor.  Watch your condition for any changes.  Take a warm bath to help with any burning or pain from having watery poop.  Keep all follow-up visits as told by your doctor. This is important. Contact a doctor if:  You cannot keep fluids down.  Your symptoms get worse.  You have new symptoms.  You feel light-headed or dizzy.  You have muscle cramps. Get help right away if:  You have chest pain.  You feel very weak or you pass out (faint).  You see blood in your throw-up.  Your throw-up looks like coffee grounds.  You have bloody or black poop (stools) or poop that look like tar.  You have a very bad headache, a stiff neck, or both.  You have a rash.  You have very bad pain, cramping, or bloating in your belly (abdomen).  You have trouble breathing.  You are breathing very quickly.  Your heart is beating very quickly.  Your skin feels cold and clammy.  You feel confused.  You have pain when you pee.  You have signs of dehydration, such as: ? Dark pee, hardly any pee, or no pee. ? Cracked lips. ? Dry mouth. ? Sunken eyes. ? Sleepiness. ? Weakness. This information is not intended to replace advice given  to you by your health care provider. Make sure you discuss any questions you have with your health care provider. Document Released: 04/11/2008 Document Revised: 07/18/2018 Document Reviewed: 06/30/2015 Elsevier Interactive Patient Education  2019 [employer].

## 2018-12-31 ENCOUNTER — Ambulatory Visit (INDEPENDENT_AMBULATORY_CARE_PROVIDER_SITE_OTHER): Payer: Medicare Other | Admitting: Physician Assistant

## 2018-12-31 VITALS — BP 120/64 | HR 79 | Temp 98.4°F | Ht 63.0 in | Wt 189.0 lb

## 2018-12-31 DIAGNOSIS — R350 Frequency of micturition: Secondary | ICD-10-CM | POA: Diagnosis not present

## 2018-12-31 DIAGNOSIS — N950 Postmenopausal bleeding: Secondary | ICD-10-CM | POA: Diagnosis not present

## 2018-12-31 DIAGNOSIS — R8290 Unspecified abnormal findings in urine: Secondary | ICD-10-CM

## 2018-12-31 LAB — POCT URINALYSIS DIPSTICK
Bilirubin, UA: NEGATIVE
Glucose, UA: NEGATIVE
Ketones, UA: NEGATIVE
Leukocytes, UA: NEGATIVE
Nitrite, UA: NEGATIVE
Protein, UA: POSITIVE — AB
Spec Grav, UA: <=1.005 — AB (ref 1.010–1.025)
Urobilinogen, UA: 0.2 E.U./dL
pH, UA: 6.5 (ref 5.0–8.0)

## 2018-12-31 MED ORDER — MEGESTROL ACETATE 40 MG PO TABS: 40.0000 mg | ORAL_TABLET | Freq: Two times a day (BID) | ORAL | 0 refills | Status: DC

## 2018-12-31 NOTE — Patient Instructions (Signed)
Dysfunctional Uterine Bleeding  Dysfunctional uterine bleeding is abnormal bleeding from the uterus. Dysfunctional uterine bleeding includes:  A menstrual period that comes earlier or later than usual.  A menstrual period that is lighter or heavier than usual, or has large blood clots.  Vaginal bleeding between menstrual periods.  Skipping one or more menstrual periods.  Vaginal bleeding after sex.  Vaginal bleeding after menopause.  Follow these instructions at home:  Eating and drinking    Eat well-balanced meals. Include foods that are high in iron, such as liver, meat, shellfish, green leafy vegetables, and eggs.  To prevent or treat constipation, your health care provider may recommend that you:  Drink enough fluid to keep your urine pale yellow.  Take over-the-counter or prescription medicines.  Eat foods that are high in fiber, such as beans, whole grains, and fresh fruits and vegetables.  Limit foods that are high in fat and processed sugars, such as fried or sweet foods.  Medicines  Take over-the-counter and prescription medicines only as told by your health care provider.  Do not change medicines without talking with your health care provider.  Aspirin or medicines that contain aspirin may make the bleeding worse. Do not take those medicines:  During the week before your menstrual period.  During your menstrual period.  If you were prescribed iron pills, take them as told by your health care provider. Iron pills help to replace iron that your body loses because of this condition.  Activity  If you need to change your sanitary pad or tampon more than one time every 2 hours:  Lie in bed with your feet raised (elevated).  Place a cold pack on your lower abdomen.  Rest as much as possible until the bleeding stops or slows down.  Do not try to lose weight until the bleeding has stopped and your blood iron level is back to normal.  General instructions    For two months, write down:  When your menstrual period  starts.  When your menstrual period ends.  When any abnormal vaginal bleeding occurs.  What problems you notice.  Keep all follow up visits as told by your health care provider. This is important.  Contact a health care provider if you:  Feel light-headed or weak.  Have nausea and vomiting.  Cannot eat or drink without vomiting.  Feel dizzy or have diarrhea while you are taking medicines.  Are taking birth control pills or hormones, and you want to change them or stop taking them.  Get help right away if:  You develop a fever or chills.  You need to change your sanitary pad or tampon more than one time per hour.  Your vaginal bleeding becomes heavier, or your flow contains clots more often.  You develop pain in your abdomen.  You lose consciousness.  You develop a rash.  Summary  Dysfunctional uterine bleeding is abnormal bleeding from the uterus.  It includes menstrual bleeding of abnormal duration, volume, or regularity.  Bleeding after sex and after menopause are also considered dysfunctional uterine bleeding.  This information is not intended to replace advice given to you by your health care provider. Make sure you discuss any questions you have with your health care provider.  Document Released: 10/21/2000 Document Revised: 04/04/2018 Document Reviewed: 04/04/2018  Elsevier Interactive Patient Education  2019 [employer]

## 2018-12-31 NOTE — Progress Notes (Signed)
Subjective:    Patient ID: Janet Richards, female    DOB: 26-Nov-1960, 58 y.o.   MRN: [id number]  HPI Pt is a 58 yo female who presents to the clinic with heavy vaginal bleeding and urinary frequency.  Patient has a history of postmenopausal bleeding.  Her last episode with this was 06/27/2018.  She was seen by GYN and had an endometrial biopsy.  Biopsy results were negative for any cancer cells.  She was treated with Megace and her bleeding resolved.  She is not on any other hormonal therapy.  3 days ago she started cramping like it.  And now she is having bleeding.  Her bleeding is very heavy.  She is going through multiple pads a day at least 1 every hour.  She has had some increase in urinary frequency as well.  She describes some lower abdominal bloating and discomfort.  She denies any fever, chills, body aches.  .. Active Ambulatory Problems    Diagnosis Date Noted  . Primary osteoarthritis of both knees 12/27/2012  . Abnormal uterine bleeding 02/05/2013  . Essential hypertension 03/01/2013  . No blood products 03/28/2013  . 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 Osteoarthritis of first metatarsophalangeal joint 04/11/2014  . Prediabetes 10/17/2014  . Other allergic rhinitis 01/14/2015  . Hyperlipidemia 02/20/2015  . History of colonic polyps 03/20/2015  . Right ankle pain 02/24/2016  . Fatigue 02/24/2016  . Anemia, iron deficiency 02/25/2016  . Ganglion cyst of right foot 07/12/2016  . Abnormal ankle brachial index (ABI) 11/09/2016  . Vaginal dryness, menopausal 03/01/2017  . Periodontitis 12/15/2017  . Urinary urgency 01/25/2018  . DDD (degenerative disc disease), cervical 02/22/2018  . Caregiver stress 03/16/2018  . Large breasts 04/15/2018  . Post-menopausal bleeding 05/21/2018  . Sensory neuronopathy 07/29/2018  . Mild intermittent asthma without complication 69/48/5462  . Gastroesophageal reflux disease without esophagitis 07/31/2018  . Environmental allergies 09/12/2018   . Irritable bowel syndrome with diarrhea 09/13/2018   Resolved Ambulatory Problems    Diagnosis Date Noted  . Allergic urticaria 12/27/2012  . Exertional chest pain 02/05/2013  . Dental abscess 02/05/2013  . Menstrual migraine 02/05/2013  . Right lumbar radiculitis 04/19/2013  . Hyperglycemia 08/05/2013  . Allergic rhinitis 01/22/2014  . Left knee pain 02/25/2014  . Dental infection 03/20/2014  . Allergic sinusitis 01/14/2015  . Facial pain 01/14/2015  . Sinusitis 06/26/2015  . Paresthesias 07/01/2015  . Chest pain 07/08/2015  . Right knee injury 07/15/2015  . Neck pain 09/01/2015  . UTI (urinary tract infection) 11/25/2015  . Acute upper respiratory infection 12/22/2015  . Poor dentition 04/11/2016  . Low blood potassium 05/24/2016  . Elevated lipase 05/24/2016  . Insect bite 05/25/2016  . Burning sensation of foot 09/02/2016  . Neuropathy 03/01/2017  . Hypertriglyceridemia 09/21/2017  . Dysfunction of both eustachian tubes 11/26/2017  . Recurrent sinusitis 11/26/2017  . Perimenopausal 01/25/2018  . Sinus pressure 05/13/2018  . Solar urticaria 07/31/2018   Past Medical History:  Diagnosis Date  . Asthma   . Essential hypertension, benign   . Urticaria       Review of Systems See HPI.     Objective:   Physical Exam Vitals signs reviewed.  Constitutional:      Appearance: Normal appearance.  HENT:     Head: Normocephalic and atraumatic.  Cardiovascular:     Rate and Rhythm: Normal rate and regular rhythm.  Pulmonary:     Effort: Pulmonary effort is normal.     Breath sounds:  Normal breath sounds.  Abdominal:     General: Abdomen is flat. Bowel sounds are normal. There is no distension.     Palpations: Abdomen is soft.     Tenderness: There is abdominal tenderness. There is no right CVA tenderness, left CVA tenderness, guarding or rebound.     Comments: Discomfort bilateral lower quadrant to palpation.   Neurological:     General: No focal deficit  present.     Mental Status: She is alert and oriented to person, place, and time.  Psychiatric:        Mood and Affect: Mood normal.        Behavior: Behavior normal.           Assessment & Plan:  Marland KitchenMarland KitchenDiagnoses and all orders for this visit:  Post-menopausal bleeding -     US PELVIS (TRANSABDOMINAL ONLY) -     US PELVIS TRANSVANGINAL NON-OB (TV ONLY) -     megestrol (MEGACE) 40 MG tablet; Take 1 tablet (40 mg total) by mouth 2 (two) times daily. When bleeding slows, take 1 per day  Urinary frequency -     POCT Urinalysis Dipstick -     Urine Culture  Abnormal urinalysis -     Urine Culture   .[name] Results for orders placed or performed in visit on 12/31/18  POCT Urinalysis Dipstick  Result Value Ref Range   Color, UA light    Clarity, UA clear    Glucose, UA Negative Negative   Bilirubin, UA Negative    Ketones, UA Negative    Spec Grav, UA <=1.005 (A) 1.010 - 1.025   Blood, UA Large    pH, UA 6.5 5.0 - 8.0   Protein, UA Positive (A) Negative   Urobilinogen, UA 0.2 0.2 or 1.0 E.U./dL   Nitrite, UA Negative    Leukocytes, UA Negative Negative   Appearance     Odor     Patient's urinalysis is positive for some protein and blood as expected.  We will get a culture to confirm any infection.  Due to patient's history of postmenopausal bleeding.  Cancer risk is of low concern.  I did discuss we could order a ultrasound to compare from her last one done approximately 6 to 9 months ago.  Obviously if there is any major differences we could consider another biopsy with GYN.  I suggested to starting Megace to control the bleeding and taper off as bleeding stops.  If she has another episode of postmenopausal bleeding I would strongly suggest following up with GYN to consider other possible surgical options.  Obviously with all her bleeding she could become anemic.  Suggest her start an iron supplement.  [name].Spent 30 minutes with patient and greater than 50 percent of visit spent  counseling patient regarding treatment plan.

## 2019-01-01 LAB — URINE CULTURE
MICRO NUMBER:: 234450
SPECIMEN QUALITY:: ADEQUATE

## 2019-01-02 NOTE — Progress Notes (Signed)
Pt has seen results on MyChart and message also sent for patient to call back if any questions.

## 2019-01-02 NOTE — Progress Notes (Signed)
Call pt: no abnormal level of bacteria on culture. How is bleeding?

## 2019-01-07 ENCOUNTER — Encounter: Payer: Self-pay | Admitting: Physician Assistant

## 2019-01-07 ENCOUNTER — Ambulatory Visit (INDEPENDENT_AMBULATORY_CARE_PROVIDER_SITE_OTHER): Payer: Medicare Other | Admitting: Physician Assistant

## 2019-01-07 VITALS — BP 114/68 | HR 86 | Temp 98.4°F | Wt 185.0 lb

## 2019-01-07 DIAGNOSIS — N938 Other specified abnormal uterine and vaginal bleeding: Secondary | ICD-10-CM

## 2019-01-07 DIAGNOSIS — R197 Diarrhea, unspecified: Secondary | ICD-10-CM

## 2019-01-07 DIAGNOSIS — R1084 Generalized abdominal pain: Secondary | ICD-10-CM | POA: Diagnosis not present

## 2019-01-07 MED ORDER — DIPHENOXYLATE-ATROPINE 2.5-0.025 MG PO TABS: ORAL_TABLET | ORAL | 0 refills | Status: DC

## 2019-01-07 NOTE — Patient Instructions (Signed)
Food Choices to Help Relieve Diarrhea, Adult  When you have diarrhea, the foods you eat and your eating habits are very important. Choosing the right foods and drinks can help:   Relieve diarrhea.   Replace lost fluids and nutrients.   Prevent dehydration.  What general guidelines should I follow?    Relieving diarrhea   Choose foods with less than 2 g or .07 oz. of fiber per serving.   Limit fats to less than 8 tsp (38 g or 1.34 oz.) a day.   Avoid the following:  ? Foods and beverages sweetened with high-fructose corn syrup, honey, or sugar alcohols such as xylitol, sorbitol, and mannitol.  ? Foods that contain a lot of fat or sugar.  ? Fried, greasy, or spicy foods.  ? High-fiber grains, breads, and cereals.  ? Raw fruits and vegetables.   Eat foods that are rich in probiotics. These foods include dairy products such as yogurt and fermented milk products. They help increase healthy bacteria in the stomach and intestines (gastrointestinal tract, or GI tract).   If you have lactose intolerance, avoid dairy products. These may make your diarrhea worse.   Take medicine to help stop diarrhea (antidiarrheal medicine) only as told by your health care provider.  Replacing nutrients   Eat small meals or snacks every 3-4 hours.   Eat bland foods, such as white rice, toast, or baked potato, until your diarrhea starts to get better. Gradually reintroduce nutrient-rich foods as tolerated or as told by your health care provider. This includes:  ? Well-cooked protein foods.  ? Peeled, seeded, and soft-cooked fruits and vegetables.  ? Low-fat dairy products.   Take vitamin and mineral supplements as told by your health care provider.  Preventing dehydration   Start by sipping water or a special solution to prevent dehydration (oral rehydration solution, ORS). Urine that is clear or pale yellow means that you are getting enough fluid.   Try to drink at least 8-10 cups of fluid each day to help replace lost  fluids.   You may add other liquids in addition to water, such as clear juice or decaffeinated sports drinks, as tolerated or as told by your health care provider.   Avoid drinks with caffeine, such as coffee, tea, or soft drinks.   Avoid alcohol.  What foods are recommended?         The items listed may not be a complete list. Talk with your health care provider about what dietary choices are best for you.  Grains  White rice. White, French, or pita breads (fresh or toasted), including plain rolls, buns, or bagels. White pasta. Saltine, soda, or graham crackers. Pretzels. Low-fiber cereal. Cooked cereals made with water (such as cornmeal, farina, or cream cereals). Plain muffins. Matzo. Melba toast. Zwieback.  Vegetables  Potatoes (without the skin). Most well-cooked and canned vegetables without skins or seeds. Tender lettuce.  Fruits  Apple sauce. Fruits canned in juice. Cooked apricots, cherries, grapefruit, peaches, pears, or plums. Fresh bananas and cantaloupe.  Meats and other protein foods  Baked or boiled chicken. Eggs. Tofu. Fish. Seafood. Smooth nut butters. Ground or well-cooked tender beef, ham, veal, lamb, pork, or poultry.  Dairy  Plain yogurt, kefir, and unsweetened liquid yogurt. Lactose-free milk, buttermilk, skim milk, or soy milk. Low-fat or nonfat hard cheese.  Beverages  Water. Low-calorie sports drinks. Fruit juices without pulp. Strained tomato and vegetable juices. Decaffeinated teas. Sugar-free beverages not sweetened with sugar alcohols. Oral rehydration solutions, if   approved by your health care provider.  Seasoning and other foods  Bouillon, broth, or soups made from recommended foods.  What foods are not recommended?  The items listed may not be a complete list. Talk with your health care provider about what dietary choices are best for you.  Grains  Whole grain, whole wheat, bran, or rye breads, rolls, pastas, and crackers. Wild or brown rice. Whole grain or bran cereals. Barley.  Oats and oatmeal. Corn tortillas or taco shells. Granola. Popcorn.  Vegetables  Raw vegetables. Fried vegetables. Cabbage, broccoli, Brussels sprouts, artichokes, baked beans, beet greens, corn, kale, legumes, peas, sweet potatoes, and yams. Potato skins. Cooked spinach and cabbage.  Fruits  Dried fruit, including raisins and dates. Raw fruits. Stewed or dried prunes. Canned fruits with syrup.  Meat and other protein foods  Fried or fatty meats. Deli meats. Chunky nut butters. Nuts and seeds. Beans and lentils. Bacon. Hot dogs. Sausage.  Dairy  High-fat cheeses. Whole milk, chocolate milk, and beverages made with milk, such as milk shakes. Half-and-half. Cream. sour cream. Ice cream.  Beverages  Caffeinated beverages (such as coffee, tea, soda, or energy drinks). Alcoholic beverages. Fruit juices with pulp. Prune juice. Soft drinks sweetened with high-fructose corn syrup or sugar alcohols. High-calorie sports drinks.  Fats and oils  Butter. Cream sauces. Margarine. Salad oils. Plain salad dressings. Olives. Avocados. Mayonnaise.  Sweets and desserts  Sweet rolls, doughnuts, and sweet breads. Sugar-free desserts sweetened with sugar alcohols such as xylitol and sorbitol.  Seasoning and other foods  Honey. Hot sauce. Chili powder. Gravy. Cream-based or milk-based soups. Pancakes and waffles.  Summary   When you have diarrhea, the foods you eat and your eating habits are very important.   Make sure you get at least 8-10 cups of fluid each day, or enough to keep your urine clear or pale yellow.   Eat bland foods and gradually reintroduce healthy, nutrient-rich foods as tolerated, or as told by your health care provider.   Avoid high-fiber, fried, greasy, or spicy foods.  This information is not intended to replace advice given to you by your health care provider. Make sure you discuss any questions you have with your health care provider.  Document Released: 01/14/2004 Document Revised: 10/21/2016 Document Reviewed:  10/21/2016  Elsevier Interactive Patient Education  2019 [employer]

## 2019-01-07 NOTE — Progress Notes (Signed)
Subjective:    Patient ID: Janet Richards, female    DOB: 11-16-1960, 58 y.o.   MRN: [id number]  HPI  Patient is a 58 year old female who presents to the clinic with generalized abdominal cramping and diarrhea for the last 2 weeks.  Initially started with dysfunctional uterine bleeding and she was seen in the clinic.  She started on progesterone and her bleeding stopped about 2 days ago.  She is now been left with multiple loose stools a day and abdominal cramping.  She is fairly exhausted from going to the bathroom multiple times an hour.  Sometimes she does feels like she has to urinate but also has loose stool.  She denies any melena or hematochezia.  Her stools are white in color.  She denies any recent international travel.  She denies any like visits.  She denies any new ingestion of food.  No one else in her close contact with [place] to have the symptoms.  She denies any fever, chills, body aches.  She denies any recent antibiotic use.   Her urine was recently cultured for UTI and negative. She has no dysuria.   .. Active Ambulatory Problems    Diagnosis Date Noted  . Primary osteoarthritis of both knees 12/27/2012  . Abnormal uterine bleeding 02/05/2013  . Essential hypertension 03/01/2013  . No blood products 03/28/2013  . 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 Osteoarthritis of first metatarsophalangeal joint 04/11/2014  . Prediabetes 10/17/2014  . Other allergic rhinitis 01/14/2015  . Hyperlipidemia 02/20/2015  . History of colonic polyps 03/20/2015  . Right ankle pain 02/24/2016  . Fatigue 02/24/2016  . Anemia, iron deficiency 02/25/2016  . Ganglion cyst of right foot 07/12/2016  . Abnormal ankle brachial index (ABI) 11/09/2016  . Vaginal dryness, menopausal 03/01/2017  . Periodontitis 12/15/2017  . Urinary urgency 01/25/2018  . DDD (degenerative disc disease), cervical 02/22/2018  . Caregiver stress 03/16/2018  . Large breasts 04/15/2018  . Post-menopausal bleeding  05/21/2018  . Sensory neuronopathy 07/29/2018  . Mild intermittent asthma without complication 78/29/5621  . Gastroesophageal reflux disease without esophagitis 07/31/2018  . Environmental allergies 09/12/2018  . Irritable bowel syndrome with diarrhea 09/13/2018   Resolved Ambulatory Problems    Diagnosis Date Noted  . Allergic urticaria 12/27/2012  . Exertional chest pain 02/05/2013  . Dental abscess 02/05/2013  . Menstrual migraine 02/05/2013  . Right lumbar radiculitis 04/19/2013  . Hyperglycemia 08/05/2013  . Allergic rhinitis 01/22/2014  . Left knee pain 02/25/2014  . Dental infection 03/20/2014  . Allergic sinusitis 01/14/2015  . Facial pain 01/14/2015  . Sinusitis 06/26/2015  . Paresthesias 07/01/2015  . Chest pain 07/08/2015  . Right knee injury 07/15/2015  . Neck pain 09/01/2015  . UTI (urinary tract infection) 11/25/2015  . Acute upper respiratory infection 12/22/2015  . Poor dentition 04/11/2016  . Low blood potassium 05/24/2016  . Elevated lipase 05/24/2016  . Insect bite 05/25/2016  . Burning sensation of foot 09/02/2016  . Neuropathy 03/01/2017  . Hypertriglyceridemia 09/21/2017  . Dysfunction of both eustachian tubes 11/26/2017  . Recurrent sinusitis 11/26/2017  . Perimenopausal 01/25/2018  . Sinus pressure 05/13/2018  . Solar urticaria 07/31/2018   Past Medical History:  Diagnosis Date  . Asthma   . Essential hypertension, benign   . Urticaria       Review of Systems See HPI.     Objective:   Physical Exam Vitals signs reviewed.  HENT:     Head: Normocephalic.  Cardiovascular:     Rate  and Rhythm: Normal rate and regular rhythm.  Pulmonary:     Effort: Pulmonary effort is normal.     Breath sounds: Normal breath sounds.  Abdominal:     General: Bowel sounds are increased. There is distension.     Palpations: Abdomen is soft.     Tenderness: There is generalized abdominal tenderness. There is no right CVA tenderness, left CVA tenderness,  guarding or rebound.  Neurological:     General: No focal deficit present.     Mental Status: She is alert.  Psychiatric:        Mood and Affect: Mood normal.           Assessment & Plan:  Marland KitchenMarland KitchenAdiel was seen today for abdominal pain.  Diagnoses and all orders for this visit:  Diarrhea, unspecified type -     Stool culture -     Clostridium difficile EIA -     diphenoxylate-atropine (LOMOTIL) 2.5-0.025 MG tablet; One to 2 tablets by mouth 4 times a day as needed for diarrhea. -     Ova and parasite examination  Generalized abdominal pain -     Stool culture -     Clostridium difficile EIA -     Ova and parasite examination  DUB (dysfunctional uterine bleeding)   Unclear etiology of diarrhea. Need stool culture to confirm no bacteria. No increased risk factors for ova and parasites. Vitals are reassuring. Added lomotil. BRAT diet. Recent u/s ordered but never had done. Encouraged for her to get done. Follow up as needed or if symptoms not improving in the next week.   Bleeding has resolved.

## 2019-01-08 ENCOUNTER — Ambulatory Visit (INDEPENDENT_AMBULATORY_CARE_PROVIDER_SITE_OTHER): Payer: Medicare Other

## 2019-01-08 ENCOUNTER — Encounter: Payer: Self-pay | Admitting: Physician Assistant

## 2019-01-08 DIAGNOSIS — N950 Postmenopausal bleeding: Secondary | ICD-10-CM

## 2019-01-08 NOTE — Progress Notes (Signed)
Small fibroid but otherwise very reassuring unremarkable exam.   How is the diarrhea?

## 2019-01-11 NOTE — Progress Notes (Signed)
Call pt: no ova and parasites. So far culture is normal as well. How are you feeling? Did lomotil help?

## 2019-01-12 LAB — STOOL CULTURE
MICRO NUMBER:: 271345
MICRO NUMBER:: 271346
MICRO NUMBER:: 271347
SHIGA RESULT:: NOT DETECTED
SPECIMEN QUALITY:: ADEQUATE
SPECIMEN QUALITY:: ADEQUATE
SPECIMEN QUALITY:: ADEQUATE

## 2019-01-12 LAB — OVA AND PARASITE EXAMINATION
CONCENTRATE RESULT:: NONE SEEN
MICRO NUMBER:: 270874
SPECIMEN QUALITY:: ADEQUATE
TRICHROME RESULT:: NONE SEEN

## 2019-01-14 NOTE — Progress Notes (Signed)
Final culture negative for any parasite or bacteria. How are symptoms?

## 2019-01-22 ENCOUNTER — Other Ambulatory Visit: Payer: Self-pay

## 2019-01-22 ENCOUNTER — Ambulatory Visit (INDEPENDENT_AMBULATORY_CARE_PROVIDER_SITE_OTHER): Payer: Medicare Other | Admitting: Physician Assistant

## 2019-01-22 ENCOUNTER — Encounter: Payer: Self-pay | Admitting: Physician Assistant

## 2019-01-22 ENCOUNTER — Ambulatory Visit (INDEPENDENT_AMBULATORY_CARE_PROVIDER_SITE_OTHER): Payer: Medicare Other

## 2019-01-22 VITALS — BP 120/66 | HR 81 | Temp 99.0°F | Wt 188.2 lb

## 2019-01-22 DIAGNOSIS — R200 Anesthesia of skin: Secondary | ICD-10-CM | POA: Diagnosis not present

## 2019-01-22 DIAGNOSIS — R0789 Other chest pain: Secondary | ICD-10-CM | POA: Diagnosis not present

## 2019-01-22 DIAGNOSIS — R222 Localized swelling, mass and lump, trunk: Secondary | ICD-10-CM

## 2019-01-22 DIAGNOSIS — R202 Paresthesia of skin: Secondary | ICD-10-CM | POA: Diagnosis not present

## 2019-01-22 DIAGNOSIS — M898X8 Other specified disorders of bone, other site: Secondary | ICD-10-CM | POA: Diagnosis not present

## 2019-01-22 MED ORDER — L-METHYLFOLATE-B6-B12 3-35-2 MG PO TABS: 1.0000 | ORAL_TABLET | Freq: Every day | ORAL | 11 refills | Status: DC

## 2019-01-22 NOTE — Progress Notes (Signed)
Subjective:    Patient ID: Janet Richards, female    DOB: February 02, 1961, 58 y.o.   MRN: [id number]  HPI  Patient is a 58 year old female who presents to the clinic with some burning sensation in left toe and mass and tenderness of sternum.  Patient has had neuropathy and burning sensation in feet for a while.  She feels like the left toe is more new.  She had normal ABIs and December 2019.  She is not currently under any treatment for neuropathy.ordered EMG but never had done.   She noticed the mass over her sternum for last 2 weeks. No trauma. No cough. No SOB. Not done anything to make better. Request CXR.   .. Active Ambulatory Problems    Diagnosis Date Noted  . Primary osteoarthritis of both knees 12/27/2012  . Abnormal uterine bleeding 02/05/2013  . Essential hypertension 03/01/2013  . No blood products 03/28/2013  . 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 Osteoarthritis of first metatarsophalangeal joint 04/11/2014  . Prediabetes 10/17/2014  . Other allergic rhinitis 01/14/2015  . Hyperlipidemia 02/20/2015  . History of colonic polyps 03/20/2015  . Right ankle pain 02/24/2016  . Fatigue 02/24/2016  . Anemia, iron deficiency 02/25/2016  . Ganglion cyst of right foot 07/12/2016  . Numbness and tingling of both feet 09/02/2016  . Abnormal ankle brachial index (ABI) 11/09/2016  . Vaginal dryness, menopausal 03/01/2017  . Periodontitis 12/15/2017  . Urinary urgency 01/25/2018  . DDD (degenerative disc disease), cervical 02/22/2018  . Caregiver stress 03/16/2018  . Large breasts 04/15/2018  . Post-menopausal bleeding 05/21/2018  . Sensory neuronopathy 07/29/2018  . Mild intermittent asthma without complication 59/93/5701  . Gastroesophageal reflux disease without esophagitis 07/31/2018  . Environmental allergies 09/12/2018  . Irritable bowel syndrome with diarrhea 09/13/2018   Resolved Ambulatory Problems    Diagnosis Date Noted  . Allergic urticaria 12/27/2012  .  Exertional chest pain 02/05/2013  . Dental abscess 02/05/2013  . Menstrual migraine 02/05/2013  . Right lumbar radiculitis 04/19/2013  . Hyperglycemia 08/05/2013  . Allergic rhinitis 01/22/2014  . Left knee pain 02/25/2014  . Dental infection 03/20/2014  . Allergic sinusitis 01/14/2015  . Facial pain 01/14/2015  . Sinusitis 06/26/2015  . Paresthesias 07/01/2015  . Chest pain 07/08/2015  . Right knee injury 07/15/2015  . Neck pain 09/01/2015  . UTI (urinary tract infection) 11/25/2015  . Acute upper respiratory infection 12/22/2015  . Poor dentition 04/11/2016  . Low blood potassium 05/24/2016  . Elevated lipase 05/24/2016  . Insect bite 05/25/2016  . Neuropathy 03/01/2017  . Hypertriglyceridemia 09/21/2017  . Dysfunction of both eustachian tubes 11/26/2017  . Recurrent sinusitis 11/26/2017  . Perimenopausal 01/25/2018  . Sinus pressure 05/13/2018  . Solar urticaria 07/31/2018   Past Medical History:  Diagnosis Date  . Asthma   . Essential hypertension, benign   . Urticaria      Review of Systems See HPI>     Objective:   Physical Exam        Assessment & Plan:  Marland KitchenMarland KitchenAnnamay was seen today for chest pain and toe pain.  Diagnoses and all orders for this visit:  Numbness and tingling of both feet -     l-methylfolate-B6-B12 (Janet Richards) 3-35-2 MG TABS tablet; Take 1 tablet by mouth daily.  Sternum pain -     DG Chest 2 View  Mass of sternum -     DG Chest 2 View   I suspect bilateral feet numbness and tingling is neuropathy.  She has  not had EMGs to confirm.  I do think she could benefit from the multivitamin Janet Richards.  Sent to the pharmacy.  She could also benefit from gabapentin/Janet Richards.  Patient declined these medications today.  Unclear etiology of firmness of sternum and tenderness.  Will get chest x-ray.  If nothing on chest x-ray consider cool compresses and ibuprofen. Follow up as needed.

## 2019-01-23 NOTE — Progress Notes (Signed)
Call pt: CXR did not show any evidence of mass. If mass persistent we could get CT of chest to get a better look.

## 2019-01-30 ENCOUNTER — Other Ambulatory Visit: Payer: Self-pay

## 2019-01-30 ENCOUNTER — Ambulatory Visit: Payer: Medicare Other | Admitting: Physician Assistant

## 2019-02-07 ENCOUNTER — Telehealth: Payer: Self-pay | Admitting: Neurology

## 2019-02-07 NOTE — Telephone Encounter (Signed)
Patient left a vm stating she would like her Atrovent nasal spray changed to Triamcinolone nasal spray. She states she had used this medication that belonged to someone else and it worked really well for her. She would like for this to be sent to the [facility] in [place] (on file). Please advise.

## 2019-02-08 MED ORDER — TRIAMCINOLONE ACETONIDE 55 MCG/ACT NA AERO: 2.0000 | INHALATION_SPRAY | Freq: Every day | NASAL | 6 refills | Status: DC

## 2019-02-08 NOTE — Telephone Encounter (Signed)
Patient made aware. RX sent to pharmacy.  

## 2019-02-08 NOTE — Telephone Encounter (Signed)
Sent nasacort to pharmacy.

## 2019-02-21 ENCOUNTER — Other Ambulatory Visit: Payer: Self-pay

## 2019-02-21 ENCOUNTER — Encounter: Payer: Self-pay | Admitting: *Deleted

## 2019-02-21 ENCOUNTER — Emergency Department (INDEPENDENT_AMBULATORY_CARE_PROVIDER_SITE_OTHER)
Admission: EM | Admit: 2019-02-21 | Discharge: 2019-02-21 | Disposition: A | Discharge status: Home / Self Care | Payer: Medicare Other | Source: Home / Self Care | Attending: Family Medicine | Admitting: Family Medicine

## 2019-02-21 DIAGNOSIS — M940 Chondrocostal junction syndrome [Tietze]: Secondary | ICD-10-CM | POA: Diagnosis not present

## 2019-02-21 NOTE — ED Triage Notes (Signed)
Pt /o pain in the center of her chest and SOB x 2 days. Denies cough or travel.

## 2019-02-21 NOTE — Discharge Instructions (Addendum)
Put ice on the painful area: Put ice in a plastic bag. Place a towel between your skin and the bag. Leave the ice on for 20 minutes, 2-3 times a day. May take Ibuprofen 800mg  every 8 hours

## 2019-02-21 NOTE — ED Provider Notes (Signed)
Janet [facility] C[facility]    CSN: [id number] Arrival date & time: 02/21/19  1740     History   Chief Complaint Chief Complaint  Patient presents with  . Chest Pain  . Shortness of Breath    HPI Janet Richards is a 58 y.o. female.   Patient complains of onset of non-radiating pain in the center of her chest yesterday, resulting in pain with inspiration and chest movement.  She denies cough, or fevers, chills, and sweats.  Her chest feels tight and she feels mildly dyspneic because of difficulty fully expanding her chest.  She reports that she has been working out more vigorously in her home gym.  She denies pain/swelling in her lower legs. Patient had been evaluated on 01/22/19 with a concern for a possible mass in her sternum, but chest X-ray that date was negative.  The history is provided by the patient.  Chest Pain  Pain location:  Substernal area Pain quality: aching and sharp   Pain radiates to:  Does not radiate Pain severity:  Mild Onset quality:  Sudden Duration:  2 days Timing:  Constant Progression:  Unchanged Chronicity:  New Context: breathing, movement and at rest   Context: not trauma   Relieved by:  None tried Worsened by:  Certain positions, deep breathing and movement Ineffective treatments:  None tried Associated symptoms: shortness of breath   Associated symptoms: no abdominal pain, no AICD problem, no anorexia, no cough, no diaphoresis, no dysphagia, no fatigue, no fever, no heartburn, no lower extremity edema, no nausea, no near-syncope, no numbness and no palpitations   Risk factors: no prior DVT/PE   Shortness of Breath  Associated symptoms: no abdominal pain, no cough, no diaphoresis, no fever, no rash and no wheezing     Past Medical History:  Diagnosis Date  . Asthma   . Essential hypertension, benign   . Menstrual migraine   . Periodontitis   . Prediabetes 10/17/2014  . Primary osteoarthritis of both knees   . Sinusitis   . Urticaria      Patient Active Problem List   Diagnosis Date Noted  . Irritable bowel syndrome with diarrhea 09/13/2018  . Environmental allergies 09/12/2018  . Mild intermittent asthma without complication 78/29/5621  . Gastroesophageal reflux disease without esophagitis 07/31/2018  . Sensory neuronopathy 07/29/2018  . Post-menopausal bleeding 05/21/2018  . Large breasts 04/15/2018  . Caregiver stress 03/16/2018  . DDD (degenerative disc disease), cervical 02/22/2018  . Urinary urgency 01/25/2018  . Periodontitis 12/15/2017  . Vaginal dryness, menopausal 03/01/2017  . Abnormal ankle brachial index (ABI) 11/09/2016  . Numbness and tingling of both feet 09/02/2016  . Ganglion cyst of right foot 07/12/2016  . Anemia, iron deficiency 02/25/2016  . Right ankle pain 02/24/2016  . Fatigue 02/24/2016  . History of colonic polyps 03/20/2015  . Hyperlipidemia 02/20/2015  . Other allergic rhinitis 01/14/2015  . Prediabetes 10/17/2014  . Osteoarthritis of first metatarsophalangeal joint 04/11/2014  . 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 No blood products 03/28/2013  . Essential hypertension 03/01/2013  . Abnormal uterine bleeding 02/05/2013  . Primary osteoarthritis of both knees 12/27/2012    Past Surgical History:  Procedure Laterality Date  . TUBAL LIGATION  12/26/1987    OB History    Gravida  5   Para  3   Term  3   Preterm      AB  2   Living  3     SAB      TAB  2   Ectopic      Multiple      Live Births               Home Medications    Prior to Admission medications   Medication Sig Start Date End Date Taking? Authorizing Provider  cetirizine HCl (ZYRTEC) 1 MG/ML solution TAKE 10 MLS (10 MG TOTAL) BY MOUTH DAILY. 04/09/18   Breeback, Luvenia Starch L, PA-C  conjugated estrogens (PREMARIN) vaginal cream Place 1 Applicatorful vaginally daily. For first 2 weeks then decrease to 3 times a week. 09/12/18   Breeback, Royetta Car, PA-C  cyclobenzaprine (FLEXERIL) 10 MG tablet Take  1 tablet (10 mg total) by mouth 3 (three) times daily as needed for muscle spasms. 07/27/18   Breeback, Royetta Car, PA-C  diphenoxylate-atropine (LOMOTIL) 2.5-0.025 MG tablet One to 2 tablets by mouth 4 times a day as needed for diarrhea. 01/07/19   Breeback, Jade L, PA-C  hydrochlorothiazide (HYDRODIURIL) 25 MG tablet TAKE 1 TABLET BY MOUTH EVERY DAY 10/09/18   Breeback, Jade L, PA-C  ipratropium (ATROVENT) 0.06 % nasal spray Place 2 sprays into both nostrils 4 (four) times daily.    [provider]  Ipratropium-Albuterol (COMBIVENT) 20-100 MCG/ACT AERS respimat Inhale 1 puff into the lungs every 6 (six) hours as needed for wheezing or shortness of breath. 03/28/18   Breeback, Jade L, PA-C  IRON, FERROUS SULFATE, PO Take by mouth.    [provider]  l-methylfolate-B6-B12 (METANX) 3-35-2 MG TABS tablet Take 1 tablet by mouth daily. 01/22/19   Breeback, Royetta Car, PA-C  megestrol (MEGACE) 40 MG tablet Take 1 tablet (40 mg total) by mouth 2 (two) times daily. When bleeding slows, take 1 per day 12/31/18   Iran Planas L, PA-C  montelukast (SINGULAIR) 10 MG tablet Take 1 tablet once a day for coughing or wheezing. 07/31/18   Charlies Silvers, MD  Multiple Vitamins-Minerals (MULTIVITAMIN ADULT PO) Take by mouth.    [provider]  Olopatadine HCl 0.2 % SOLN  04/28/18   [provider]  pantoprazole (PROTONIX) 40 MG tablet TAKE 1 TABLET (40 MG TOTAL) BY MOUTH DAILY. 10/11/18   [name], PA-C  Probiotic Product (PROBIOTIC-10) CAPS Take by mouth.    [provider]  triamcinolone (NASACORT) 55 MCG/ACT AERO nasal inhaler Place 2 sprays into the nose daily. 02/08/19   [name], PA-C    Family History Family History  Problem Relation Age of Onset  . Hypertension Mother   . CAD Father        MI in his 48s  . Allergic rhinitis Father   . Urticaria Sister   . Allergic rhinitis Son   . Asthma Son   . Angioedema Neg Hx   . Eczema Neg Hx   . Immunodeficiency  Neg Hx     Social History Social History   Tobacco Use  . Smoking status: Former Smoker    Packs/day: 1.50    Types: Cigarettes    Last attempt to quit: 02/05/2006    Years since quitting: 13.0  . Smokeless tobacco: Never Used  Substance Use Topics  . Alcohol use: Yes    Comment: rarely  . Drug use: No     Allergies   Azithromycin; Clindamycin/lincomycin; Penicillins; and Sulfa antibiotics   Review of Systems Review of Systems  Constitutional: Negative for diaphoresis, fatigue and fever.  HENT: Negative for trouble swallowing.   Respiratory: Positive for chest tightness and shortness of breath. Negative for  cough, wheezing and stridor.   Cardiovascular: Negative for palpitations, leg swelling and near-syncope.  Gastrointestinal: Negative for abdominal pain, anorexia, heartburn and nausea.  Skin: Negative for rash.  Neurological: Negative for numbness.  All other systems reviewed and are negative.    Physical Exam Triage Vital Signs ED Triage Vitals  Enc Vitals Group     BP 02/21/19 1756 (!) 160/83     Pulse Rate 02/21/19 1756 94     Resp 02/21/19 1756 18     Temp 02/21/19 1756 97.9 F (36.6 C)     Temp Source 02/21/19 1756 Oral     SpO2 02/21/19 1756 98 %     Weight --      Height --      Head Circumference --      Peak Flow --      Pain Score 02/21/19 1757 8     Pain Loc --      Pain Edu? --      Excl. in [place]? --    No data found.  Updated Vital Signs BP (!) 160/83 (BP Location: Left Arm)   Pulse 94   Temp 97.9 F (36.6 C) (Oral)   Resp 18   LMP 09/24/2017   SpO2 98%   Visual Acuity Right Eye Distance:   Left Eye Distance:   Bilateral Distance:    Right Eye Near:   Left Eye Near:    Bilateral Near:     Physical Exam Vitals signs and nursing note reviewed.  Constitutional:      General: She is not in acute distress. HENT:     Head: Normocephalic.     Right Ear: Tympanic membrane, ear canal and external ear normal.     Left Ear: Tympanic  membrane, ear canal and external ear normal.     Nose: Nose normal.     Mouth/Throat:     Pharynx: Oropharynx is clear.  Eyes:     Extraocular Movements: Extraocular movements intact.     Conjunctiva/sclera: Conjunctivae normal.     Pupils: Pupils are equal, round, and reactive to light.  Cardiovascular:     Rate and Rhythm: Normal rate and regular rhythm.     Heart sounds: Normal heart sounds.  Pulmonary:     Effort: Pulmonary effort is normal. No respiratory distress.     Breath sounds: Normal breath sounds. No stridor. No wheezing, rhonchi or rales.  Chest:     Chest wall: Tenderness present.       Comments: Chest:  There is distinct tenderness to palpation over the sternum  as noted on diagram.  Palpation recreates the patient's pain.  Abdominal:     Tenderness: There is no abdominal tenderness.  Musculoskeletal:        General: No tenderness.     Right lower leg: No edema.     Left lower leg: No edema.  Lymphadenopathy:     Cervical: No cervical adenopathy.  Skin:    General: Skin is warm and dry.     Findings: No rash.  Neurological:     Mental Status: She is alert.      UC Treatments / Results  Labs (all labs ordered are listed, but only abnormal results are displayed) Labs Reviewed - No data to display  EKG None  Radiology No results found.  Procedures Procedures (including critical care time)  Medications Ordered in UC Medications - No data to display  Initial Impression / Assessment and Plan / UC Course  I have reviewed the triage vital signs and the nursing notes.  Pertinent labs & imaging results that were available during my care of the patient were reviewed by me and considered in my medical decision making (see chart for details).    Reassurance Followup with Family Doctor if not improved in about 2 weeks. Final Clinical Impressions(s) / UC Diagnoses   Final diagnoses:  Costochondritis     Discharge Instructions      Put ice on the  painful area: ? Put ice in a plastic bag. ? Place a towel between your skin and the bag. ? Leave the ice on for 20 minutes, 2-3 times a day.  May take Ibuprofen 800mg  every 8 hours    ED Prescriptions    None         Kandra Nicolas, MD 02/21/19 [phone]

## 2019-02-25 ENCOUNTER — Ambulatory Visit (INDEPENDENT_AMBULATORY_CARE_PROVIDER_SITE_OTHER): Payer: Medicare Other | Admitting: Physician Assistant

## 2019-02-25 VITALS — BP 141/72 | HR 90 | Temp 98.4°F | Ht 63.0 in | Wt 196.0 lb

## 2019-02-25 DIAGNOSIS — R200 Anesthesia of skin: Secondary | ICD-10-CM | POA: Diagnosis not present

## 2019-02-25 DIAGNOSIS — K121 Other forms of stomatitis: Secondary | ICD-10-CM | POA: Diagnosis not present

## 2019-02-25 DIAGNOSIS — G629 Polyneuropathy, unspecified: Secondary | ICD-10-CM

## 2019-02-25 DIAGNOSIS — R202 Paresthesia of skin: Secondary | ICD-10-CM | POA: Diagnosis not present

## 2019-02-25 MED ORDER — FOLTANX 3-35-2 MG PO TABS: 1.0000 | ORAL_TABLET | Freq: Two times a day (BID) | ORAL | 11 refills | Status: DC

## 2019-02-25 MED ORDER — TRIAMCINOLONE ACETONIDE 0.1 % MT PSTE: 1.0000 "application " | PASTE | Freq: Three times a day (TID) | OROMUCOSAL | 1 refills | Status: DC

## 2019-02-26 ENCOUNTER — Telehealth: Payer: Self-pay

## 2019-02-26 ENCOUNTER — Encounter: Payer: Self-pay | Admitting: Physician Assistant

## 2019-02-26 MED ORDER — AZELASTINE HCL 0.05 % OP SOLN: 1.0000 [drp] | Freq: Two times a day (BID) | OPHTHALMIC | 5 refills | Status: DC

## 2019-02-26 NOTE — Telephone Encounter (Signed)
Pt advised that RX sent to pharmacy. States that her eyes are not watering from allergies, she states they are watering from her mouth, which she states she talked with you about yesterday. She will try eye drops.

## 2019-02-26 NOTE — Telephone Encounter (Signed)
Pt seen in office yesterday.   States her eyes are watering a lot and is wanting RX for Cipro called in to [facility] on [address]   Please advise

## 2019-02-26 NOTE — Telephone Encounter (Signed)
Cipro is an antibiotic that treats urinary track infections. Would you like optivar an allergic eye drop that can help with watery eyes?  I did send that to pharmacy.

## 2019-02-26 NOTE — Progress Notes (Signed)
Subjective:    Patient ID: Janet Richards, female    DOB: [birth year], 58 y.o.   MRN: [id number]  HPI Pt is a 58 yo female who presents to the clinic to follow up on bilateral feet numbness and tingling. We have been treating her for neuropathy despite not having EMG's. She had great benefit from the metanex supplement but it was a little costly. One tablet helped some but 2 tablets daily helped almost 100 percent. She would like a refill.   She also has some tenderness along the gumline of upper plate. No known injury. Started about 1 day ago. Not used any new products. No fever, chills, body aches. No teeth pain.   .. Active Ambulatory Problems    Diagnosis Date Noted  . Primary osteoarthritis of both knees 12/27/2012  . Abnormal uterine bleeding 02/05/2013  . Essential hypertension 03/01/2013  . No blood products 03/28/2013  . 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 Osteoarthritis of first metatarsophalangeal joint 04/11/2014  . Prediabetes 10/17/2014  . Other allergic rhinitis 01/14/2015  . Hyperlipidemia 02/20/2015  . History of colonic polyps 03/20/2015  . Right ankle pain 02/24/2016  . Fatigue 02/24/2016  . Anemia, iron deficiency 02/25/2016  . Ganglion cyst of right foot 07/12/2016  . Numbness and tingling of both feet 09/02/2016  . Abnormal ankle brachial index (ABI) 11/09/2016  . Vaginal dryness, menopausal 03/01/2017  . Periodontitis 12/15/2017  . Urinary urgency 01/25/2018  . DDD (degenerative disc disease), cervical 02/22/2018  . Caregiver stress 03/16/2018  . Large breasts 04/15/2018  . Post-menopausal bleeding 05/21/2018  . Sensory neuronopathy 07/29/2018  . Mild intermittent asthma without complication 62/95/2841  . Gastroesophageal reflux disease without esophagitis 07/31/2018  . Environmental allergies 09/12/2018  . Irritable bowel syndrome with diarrhea 09/13/2018   Resolved Ambulatory Problems    Diagnosis Date Noted  . Allergic urticaria 12/27/2012  .  Exertional chest pain 02/05/2013  . Dental abscess 02/05/2013  . Menstrual migraine 02/05/2013  . Right lumbar radiculitis 04/19/2013  . Hyperglycemia 08/05/2013  . Allergic rhinitis 01/22/2014  . Left knee pain 02/25/2014  . Dental infection 03/20/2014  . Allergic sinusitis 01/14/2015  . Facial pain 01/14/2015  . Sinusitis 06/26/2015  . Paresthesias 07/01/2015  . Chest pain 07/08/2015  . Right knee injury 07/15/2015  . Neck pain 09/01/2015  . UTI (urinary tract infection) 11/25/2015  . Acute upper respiratory infection 12/22/2015  . Poor dentition 04/11/2016  . Low blood potassium 05/24/2016  . Elevated lipase 05/24/2016  . Insect bite 05/25/2016  . Neuropathy 03/01/2017  . Hypertriglyceridemia 09/21/2017  . Dysfunction of both eustachian tubes 11/26/2017  . Recurrent sinusitis 11/26/2017  . Perimenopausal 01/25/2018  . Sinus pressure 05/13/2018  . Solar urticaria 07/31/2018   Past Medical History:  Diagnosis Date  . Asthma   . Essential hypertension, benign   . Urticaria       Review of Systems See HPI.     Objective:   Physical Exam Vitals signs reviewed.  Constitutional:      Appearance: Normal appearance.  HENT:     Head: Normocephalic.     Mouth/Throat:     Mouth: Mucous membranes are moist.     Pharynx: No oropharyngeal exudate.     Comments: Erythematous upper gums with area right above incisor that appears ulcerous. Tender to palpate.  Pulmonary:     Effort: Pulmonary effort is normal.  Skin:    Comments: Bilateral feet inspection normal.  Sensation intact.  NROM.   Neurological:  General: No focal deficit present.     Mental Status: She is alert and oriented to person, place, and time.  Psychiatric:        Mood and Affect: Mood normal.           Assessment & Plan:  Marland KitchenMarland KitchenSuraya was seen today for peripheral neuropathy.  Diagnoses and all orders for this visit:  Neuropathy -     L-Methylfolate-B6-B12 (FOLTANX) 3-35-2 MG TABS; Take  1 tablet by mouth 2 (two) times daily.  Numbness and tingling of both feet -     L-Methylfolate-B6-B12 (FOLTANX) 3-35-2 MG TABS; Take 1 tablet by mouth 2 (two) times daily.  Mouth ulcer -     triamcinolone (KENALOG) 0.1 % paste; Use as directed 1 application in the mouth or throat 3 (three) times daily.   Switched to foltanx that could be cheaper than metanx. Can take up to 2 tablets daily. Discussed keeping good blood supply to the area with walking regularly. if not improving could consider gabapentin and lyrica. Pt declined today. there is also  PT office that treats neuropathy. Follow up 1 year.   Discussed I don't see any signs of infection at this point in gums. It does appear to be an ulcer in mouth. Sent some triamcinolone paste to use for the next few days. Call if symptoms worsening and could consider other treatment.   [name].Spent 30 minutes with patient and greater than 50 percent of visit spent counseling patient regarding treatment plan.

## 2019-03-11 ENCOUNTER — Telehealth: Payer: Self-pay

## 2019-03-11 ENCOUNTER — Other Ambulatory Visit: Payer: Self-pay | Admitting: Physician Assistant

## 2019-03-11 MED ORDER — OLOPATADINE HCL 0.2 % OP SOLN: 1.0000 [drp] | Freq: Two times a day (BID) | OPHTHALMIC | 5 refills | Status: DC

## 2019-03-11 NOTE — Telephone Encounter (Signed)
Patient calls stating that she has been using the eye drops Jade gave her, but still having issues.   Reports that eye drops give her a bad taste in her mouth. Also having issues with itchy eyes and a "crust" on them. States it seems like a stye sometimes but then it goes away.   She wanted to call and give FYI to [facility]

## 2019-03-11 NOTE — Telephone Encounter (Signed)
Patient made aware to stop Optivar and start Patanolol. She doesn't remember taking it before, but will try and see if it works. Will let us know.

## 2019-03-11 NOTE — Telephone Encounter (Signed)
Ok to write for qd

## 2019-03-11 NOTE — Telephone Encounter (Signed)
Stop optivar. Start patanolol. I have given this to your before do you know if you got benefit?

## 2019-03-11 NOTE — Telephone Encounter (Signed)
Insurance will not cover BID dosing.

## 2019-03-12 ENCOUNTER — Other Ambulatory Visit: Payer: Self-pay | Admitting: Neurology

## 2019-03-12 MED ORDER — CETIRIZINE HCL 1 MG/ML PO SOLN: ORAL | 1 refills | Status: DC

## 2019-03-15 NOTE — Telephone Encounter (Signed)
Spoke with patient. No sinusitis symptoms. No facial pain, headache, ear pain, congestion. Only symptom is facial swelling. She states the only time her face swells is when she has an infection. I did tell her than for swelling you would use prednisone and she refuses RX. She still has some at home, but states it makes her gain weight. I told her I would make you aware and call her back if you advised anything different. If swelling gets worse I did advise her to start Prednisone over the weekend.

## 2019-03-15 NOTE — Telephone Encounter (Signed)
Are you having sinusitis symptoms? Facial pain, blowing out sputum, headache, ear pain?   Since you are having some many allergies swelling is likely allergic in origin. Usually for that we send prednisone variants not antibiotics.

## 2019-03-15 NOTE — Telephone Encounter (Signed)
I understand she usually swells with infections. I just have to know what kind of infection in order to send the best antibiotic. So if she is not having ear infection symptoms, sinus infection symptoms, strep symptoms, urinary tract infection symptoms I can't really just send antibiotic for facial swelling. Does this make sense?

## 2019-03-15 NOTE — Telephone Encounter (Signed)
Patient called and left vm. She stated the eye drops are helping, but now her face is swollen and was asking about an antibiotic. Please advise.

## 2019-03-15 NOTE — Telephone Encounter (Signed)
Patient made aware.

## 2019-03-20 ENCOUNTER — Encounter: Payer: Self-pay | Admitting: Physician Assistant

## 2019-03-20 ENCOUNTER — Telehealth: Payer: Self-pay | Admitting: Neurology

## 2019-03-20 ENCOUNTER — Ambulatory Visit (INDEPENDENT_AMBULATORY_CARE_PROVIDER_SITE_OTHER): Payer: Medicare Other | Admitting: Physician Assistant

## 2019-03-20 VITALS — BP 154/67 | HR 95 | Temp 98.5°F | Ht 63.0 in | Wt 194.0 lb

## 2019-03-20 DIAGNOSIS — K0530 Chronic periodontitis, unspecified: Secondary | ICD-10-CM | POA: Diagnosis not present

## 2019-03-20 DIAGNOSIS — I10 Essential (primary) hypertension: Secondary | ICD-10-CM | POA: Diagnosis not present

## 2019-03-20 MED ORDER — FLUCONAZOLE 150 MG PO TABS: 150.0000 mg | ORAL_TABLET | Freq: Once | ORAL | 0 refills | Status: AC

## 2019-03-20 MED ORDER — DOXYCYCLINE HYCLATE 100 MG PO TABS: 100.0000 mg | ORAL_TABLET | Freq: Two times a day (BID) | ORAL | 0 refills | Status: DC

## 2019-03-20 MED ORDER — CHLORHEXIDINE GLUCONATE 0.12% ORAL RINSE (MEDLINE KIT): 15.0000 mL | Freq: Two times a day (BID) | OROMUCOSAL | 1 refills | Status: DC

## 2019-03-20 NOTE — Telephone Encounter (Signed)
Patient called back and left vm stating she has a gum infection. She has throbbing in her gums/face. Still having facial swelling. She is requesting antibiotic. States she can come for an appt if needed. Please advise.

## 2019-03-20 NOTE — Progress Notes (Signed)
Subjective:     Patient ID: Janet Richards, female   DOB: 1961/01/11, 58 y.o.   MRN: [id number]  HPI  Pt is a 58 yo female with HTN and hx of dental infections/tooth pain/ginivitis. She comes in to day with gums irritated, swelling and now her face feels like it is swelling. Overall she does not feel good. No fever, chills, cough, body aches. She does have some facial pressure. She brushes twice a day. Dentist office is closed due to [place].   [name]. Active Ambulatory Problems    Diagnosis Date Noted  . Primary osteoarthritis of both knees 12/27/2012  . Abnormal uterine bleeding 02/05/2013  . Essential hypertension 03/01/2013  . No blood products 03/28/2013  . 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 Osteoarthritis of first metatarsophalangeal joint 04/11/2014  . Prediabetes 10/17/2014  . Other allergic rhinitis 01/14/2015  . Hyperlipidemia 02/20/2015  . History of colonic polyps 03/20/2015  . Right ankle pain 02/24/2016  . Fatigue 02/24/2016  . Anemia, iron deficiency 02/25/2016  . Ganglion cyst of right foot 07/12/2016  . Numbness and tingling of both feet 09/02/2016  . Abnormal ankle brachial index (ABI) 11/09/2016  . Vaginal dryness, menopausal 03/01/2017  . Periodontitis 12/15/2017  . Urinary urgency 01/25/2018  . DDD (degenerative disc disease), cervical 02/22/2018  . Caregiver stress 03/16/2018  . Large breasts 04/15/2018  . Post-menopausal bleeding 05/21/2018  . Sensory neuronopathy 07/29/2018  . Mild intermittent asthma without complication 21/30/8657  . Gastroesophageal reflux disease without esophagitis 07/31/2018  . Environmental allergies 09/12/2018  . Irritable bowel syndrome with diarrhea 09/13/2018   Resolved Ambulatory Problems    Diagnosis Date Noted  . Allergic urticaria 12/27/2012  . Exertional chest pain 02/05/2013  . Dental abscess 02/05/2013  . Menstrual migraine 02/05/2013  . Right lumbar radiculitis 04/19/2013  . Hyperglycemia 08/05/2013  . Allergic  rhinitis 01/22/2014  . Left knee pain 02/25/2014  . Dental infection 03/20/2014  . Allergic sinusitis 01/14/2015  . Facial pain 01/14/2015  . Sinusitis 06/26/2015  . Paresthesias 07/01/2015  . Chest pain 07/08/2015  . Right knee injury 07/15/2015  . Neck pain 09/01/2015  . UTI (urinary tract infection) 11/25/2015  . Acute upper respiratory infection 12/22/2015  . Poor dentition 04/11/2016  . Low blood potassium 05/24/2016  . Elevated lipase 05/24/2016  . Insect bite 05/25/2016  . Neuropathy 03/01/2017  . Hypertriglyceridemia 09/21/2017  . Dysfunction of both eustachian tubes 11/26/2017  . Recurrent sinusitis 11/26/2017  . Perimenopausal 01/25/2018  . Sinus pressure 05/13/2018  . Solar urticaria 07/31/2018   Past Medical History:  Diagnosis Date  . Asthma   . Essential hypertension, benign   . Urticaria      Review of Systems See HPI.     Objective:   Physical Exam Vitals signs reviewed.  Constitutional:      Appearance: Normal appearance.  HENT:     Head: Normocephalic.     Nose: Congestion present.     Mouth/Throat:     Pharynx: No oropharyngeal exudate.     Comments: Poor dentition. Multiple teeth missing. Red, inflammed, swollen gums.  Eyes:     Conjunctiva/sclera: Conjunctivae normal.     Pupils: Pupils are equal, round, and reactive to light.  Cardiovascular:     Rate and Rhythm: Normal rate and regular rhythm.  Pulmonary:     Effort: Pulmonary effort is normal.     Breath sounds: Normal breath sounds.  Neurological:     General: No focal deficit present.     Mental  Status: She is alert and oriented to person, place, and time.  Psychiatric:        Mood and Affect: Mood normal.        Behavior: Behavior normal.        Assessment:     Marland KitchenMarland KitchenBreyana was seen today for oral pain.  Diagnoses and all orders for this visit:  Periodontitis -     chlorhexidine gluconate, MEDLINE KIT, (PERIDEX) 0.12 % solution; Use as directed 15 mLs in the mouth or  throat 2 (two) times daily. -     doxycycline (VIBRA-TABS) 100 MG tablet; Take 1 tablet (100 mg total) by mouth 2 (two) times daily.  Essential hypertension  Other orders -     fluconazole (DIFLUCAN) 150 MG tablet; Take 1 tablet (150 mg total) by mouth once for 1 dose.       Plan:     Pt has hx of gingivitis/poor dentition/teeth decay. Dentist are closed right now. She has had multiple teeth pulled. Gave mouth wash to try to keep inflammation and infection at [place]. doxycyclcine given today. Diflucan for yeast after abx. Follow up with dentist.   BP elevated today. She did not take BP medication this morning.

## 2019-03-20 NOTE — Patient Instructions (Signed)
Periodontal Disease Periodontal disease, also called gum disease or gingivitis, is inflammation, infection, or both that affects the tissue that surrounds and supports the teeth (periodontal tissue). Periodontal tissue includes the gums, the tissues (ligaments) that hold the teeth in place, and the tooth sockets (alveolar bones). Periodontal disease can affect tissue around one tooth or many teeth. If this condition is not treated, it can cause you to lose a tooth. What are the causes? This condition is usually caused by plaque. Plaque contains harmful bacteria that can cause gums to become swollen and infected. If periodontal disease gets worse (progresses), it can also damage other supporting tissues. Plaque can develop due to poor oral care, such as not brushing teeth enough, not visiting the dentist regularly, or eating and drinking too many sugary foods and beverages. What increases the risk? This condition is more likely to develop in people who:  Smoke.  Use tobacco.  Clench or grind their teeth.  Abuse substances.  Are going through the hormonal changes of puberty, menopause, or pregnancy.  Are under stress.  Are taking certain medicines, such as steroids, antiseizure medicines, or medicines to treat cancer.  Have diabetes.  Have poor nutrition.  Have a disease that interferes with the body's disease-fighting system (immune system).  Have a family history of periodontal disease. What are the signs or symptoms? Symptoms of this condition include:  Red or swollen gums.  Bad breath that does not go away.  Gums that have pulled away from the teeth.  Gums that bleed easily.  Teeth that are loose or separating.  Pain when chewing.  Changes in the way your teeth fit together.  Sensitive teeth. How is this diagnosed? This condition is diagnosed with an exam of the tissue around your teeth. Your health care provider may also take an X-ray of your teeth and ask about  your medical history. How is this treated? Treatment for this condition depends on the extent of the disease, which is determined by a dental exam. Treatment may include:  Brushing and flossing regularly.  A deep dental cleaning that involves scraping the buildup of plaque and tartar from below the gum line (scaling and root planing). This may be needed if the disease progresses.  Antibiotic medicines. These may be taken by mouth (orally) or as a rinse.  Surgery, in some severe cases. This may involve a surgery to lift up the gums and remove tartar deposits or to reduce a pocket of periodontal disease (flap surgery). Surgery might also involve procedures that replace damaged areas and help healthy bone and tissue to grow (bone and tissue grafting). Follow these instructions at home:   Practice good oral hygiene: ? Brush your teeth two times a day with a soft toothbrush. ? Floss between your teeth every day. ? Get regular dental exams.  If you were prescribed antibiotic medicine or a rinse, take or use it as told by your health care provider. Do not stop taking or using the antibiotic even if your condition improves.  Take over-the-counter and prescription medicines only as told by your health care provider.  Do not use any products that contain nicotine or tobacco, such as cigarettes and e-cigarettes. If you need help quitting, ask your health care provider.  Eat a well-balanced diet that includes plenty of vegetables, fruits, whole grains, low-fat dairy products, and lean protein. ? Do not eat a lot of foods that are high in solid fats, added sugars, or salt. ? Avoid beverages that contain a  lot of sugar. Contact a health care provider if:  Your symptoms do not improve. Get help right away if:  You have swelling in your face, neck, or jaw.  You have severe pain that does not get better with medicine.  You have a fever. Summary  Periodontal disease, also called gum disease or  gingivitis, is inflammation, infection, or both that affects the tissue that surrounds and supports the teeth (periodontal tissue).  Practice good oral hygiene. Brush your teeth two times a day with a soft toothbrush. Floss between your teeth every day.  Do not use any products that contain nicotine or tobacco, such as cigarettes and e-cigarettes. If you need help quitting, ask your health care provider.  If you were prescribed antibiotic medicine or a rinse, take or use it as told by your health care provider. Do not stop taking or using the antibiotic even if your condition improves. This information is not intended to replace advice given to you by your health care provider. Make sure you discuss any questions you have with your health care provider. Document Released: 10/27/2003 Document Revised: 08/05/2016 Document Reviewed: 08/05/2016 Elsevier Interactive Patient Education  2019 [employer].

## 2019-03-20 NOTE — Telephone Encounter (Signed)
Appt scheduled for this afternoon.

## 2019-03-20 NOTE — Telephone Encounter (Signed)
Can we see if she can come in this afternoon?

## 2019-03-22 ENCOUNTER — Encounter: Payer: Self-pay | Admitting: Physician Assistant

## 2019-03-29 ENCOUNTER — Ambulatory Visit: Payer: Medicare Other | Admitting: Physician Assistant

## 2019-04-04 ENCOUNTER — Ambulatory Visit: Payer: Medicare Other | Admitting: Sports Medicine

## 2019-04-09 ENCOUNTER — Other Ambulatory Visit: Payer: Self-pay

## 2019-04-09 ENCOUNTER — Encounter: Payer: Self-pay | Admitting: Sports Medicine

## 2019-04-09 ENCOUNTER — Ambulatory Visit: Payer: Medicare Other | Admitting: Sports Medicine

## 2019-04-09 ENCOUNTER — Ambulatory Visit (INDEPENDENT_AMBULATORY_CARE_PROVIDER_SITE_OTHER): Payer: Medicare Other | Admitting: Sports Medicine

## 2019-04-09 DIAGNOSIS — K861 Other chronic pancreatitis: Secondary | ICD-10-CM

## 2019-04-09 DIAGNOSIS — R1013 Epigastric pain: Secondary | ICD-10-CM

## 2019-04-09 DIAGNOSIS — N3946 Mixed incontinence: Secondary | ICD-10-CM

## 2019-04-09 LAB — POCT URINALYSIS DIPSTICK
Bilirubin, UA: NEGATIVE
Blood, UA: NEGATIVE
Glucose, UA: NEGATIVE
Ketones, UA: NEGATIVE
Leukocytes, UA: NEGATIVE
Nitrite, UA: NEGATIVE
Protein, UA: POSITIVE — AB
Spec Grav, UA: 1.02 (ref 1.010–1.025)
Urobilinogen, UA: 0.2 E.U./dL
pH, UA: 7 (ref 5.0–8.0)

## 2019-04-09 MED ORDER — PANTOPRAZOLE SODIUM 40 MG PO TBEC: 40.0000 mg | DELAYED_RELEASE_TABLET | Freq: Every day | ORAL | 3 refills | Status: DC

## 2019-04-09 MED ORDER — MIRABEGRON ER 25 MG PO TB24: 25.0000 mg | ORAL_TABLET | Freq: Every day | ORAL | 3 refills | Status: DC

## 2019-04-09 NOTE — Progress Notes (Addendum)
Subjective:    CC: Abdominal pain, frequent urination  HPI: This is a 58 year old female, for years now she has had episodes of frequent urination, occasional episodes of incontinence, she has been tested and treated multiple times, urinalyses have frequently been negative, urine cultures have always been negative, she is had several courses of antibiotics without improvement in symptoms.  She does endorse occasional leaking with Valsalva, and also endorses occasional episodes of significant urge to void, to the point where she sometimes does not make it to the toilet.  She has never been on a an overactive bladder medication.  In addition she is complaining of abdominal bloating, epigastric discomfort, she does have a history of iron deficiency anemia though she does not menstruate.  I reviewed the past medical history, family history, social history, surgical history, and allergies today and no changes were needed.  Please see the problem list section below in epic for further details.  Past Medical History: Past Medical History:  Diagnosis Date  . Asthma   . Essential hypertension, benign   . Menstrual migraine   . Periodontitis   . Prediabetes 10/17/2014  . Primary osteoarthritis of both knees   . Sinusitis   . Urticaria    Past Surgical History: Past Surgical History:  Procedure Laterality Date  . TUBAL LIGATION  12/26/1987   Social History: Social History   Socioeconomic History  . Marital status: Married    Spouse name: Not on file  . Number of children: 3  . Years of education: Not on file  . Highest education level: Not on file  Occupational History  . Occupation: homemaker  Social Needs  . Financial resource strain: Not on file  . Food insecurity:    Worry: Not on file    Inability: Not on file  . Transportation needs:    Medical: Not on file    Non-medical: Not on file  Tobacco Use  . Smoking status: Former Smoker    Packs/day: 1.50    Types: Cigarettes     Last attempt to quit: 02/05/2006    Years since quitting: 13.1  . Smokeless tobacco: Never Used  Substance and Sexual Activity  . Alcohol use: Yes    Comment: rarely  . Drug use: No  . Sexual activity: Yes    Partners: Male  Lifestyle  . Physical activity:    Days per week: Not on file    Minutes per session: Not on file  . Stress: Not on file  Relationships  . Social connections:    Talks on phone: Not on file    Gets together: Not on file    Attends religious service: Not on file    Active member of club or organization: Not on file    Attends meetings of clubs or organizations: Not on file    Relationship status: Not on file  Other Topics Concern  . Not on file  Social History Narrative  . Not on file   Family History: Family History  Problem Relation Age of Onset  . Hypertension Mother   . CAD Father        MI in his 61s  . Allergic rhinitis Father   . Urticaria Sister   . Allergic rhinitis Son   . Asthma Son   . Angioedema Neg Hx   . Eczema Neg Hx   . Immunodeficiency Neg Hx    Allergies: Allergies  Allergen Reactions  . Azithromycin Nausea And Vomiting  . Clindamycin/Lincomycin Hives  Facial swelling  . Penicillins Swelling and Other (See Comments)    Patient states does not work for her.  She denies swelling or hives from penicillin. She states penicillin not effective for infections. Has taken cephalosporins without problems  . Sulfa Antibiotics Hives   Medications: See med rec.  Review of Systems: No fevers, chills, night sweats, weight loss, chest pain, or shortness of breath.   Objective:    General: Well Developed, well nourished, and in no acute distress.  Neuro: Alert and oriented x3, extra-ocular muscles intact, sensation grossly intact.  HEENT: Normocephalic, atraumatic, pupils equal round reactive to light, neck supple, no masses, no lymphadenopathy, thyroid nonpalpable.  Skin: Warm and dry, no rashes. Cardiac: Regular rate and rhythm,  no murmurs rubs or gallops, no lower extremity edema.  Respiratory: Clear to auscultation bilaterally. Not using accessory muscles, speaking in full sentences. Abdomen: Soft, tender to palpation in the epigastrium, nondistended, normal bowel sounds, no palpable masses, no guarding, rigidity, rebound tenderness.  Urinalysis is negative for everything except for protein.  Impression and Recommendations:    Mixed incontinence urge and stress Adding Myrbetriq, we need to use this medication due to lack of anticholinergic adverse effects in an older patient. Get a urinalysis.  Dyspepsia Moderate epigastric pain, history of iron deficiency anemia, I suspect she has peptic ulcer disease or gastritis. Protonix 40 mg twice a day, CBC, CMP, amylase, lipase. Return to see me in 6 weeks, referral for EGD if no better.  Chronic pancreatitis (HCC) Historical mild elevations in lipase. In addition to the treatment plan outlined in the visit I am going to add a right upper quadrant ultrasound looking for cholelithiasis.   ___________________________________________ Gwen Her. Dianah Field, M.D., ABFM., CAQSM. Primary Care and Sports Medicine [facility]  Adjunct Professor of [employer]

## 2019-04-09 NOTE — Assessment & Plan Note (Signed)
Moderate epigastric pain, history of iron deficiency anemia, I suspect she has peptic ulcer disease or gastritis. Protonix 40 mg twice a day, CBC, CMP, amylase, lipase. Return to see me in 6 weeks, referral for EGD if no better.

## 2019-04-09 NOTE — Assessment & Plan Note (Addendum)
Adding Myrbetriq, we need to use this medication due to lack of anticholinergic adverse effects in an older patient. Get a urinalysis.

## 2019-04-10 ENCOUNTER — Other Ambulatory Visit: Payer: Self-pay | Admitting: Neurology

## 2019-04-10 DIAGNOSIS — K861 Other chronic pancreatitis: Secondary | ICD-10-CM | POA: Insufficient documentation

## 2019-04-10 LAB — COMPLETE METABOLIC PANEL WITH GFR
AG Ratio: 1.8 (calc) (ref 1.0–2.5)
ALT: 47 U/L — ABNORMAL HIGH (ref 6–29)
AST: 27 U/L (ref 10–35)
Albumin: 4.6 g/dL (ref 3.6–5.1)
Alkaline phosphatase (APISO): 67 U/L (ref 37–153)
BUN: 18 mg/dL (ref 7–25)
CO2: 30 mmol/L (ref 20–32)
Calcium: 10 mg/dL (ref 8.6–10.4)
Chloride: 102 mmol/L (ref 98–110)
Creat: 0.99 mg/dL (ref 0.50–1.05)
GFR, Est African American: 74 mL/min/{1.73_m2} (ref >=60)
GFR, Est Non African American: 64 mL/min/{1.73_m2} (ref >=60)
Globulin: 2.5 g/dL (calc) (ref 1.9–3.7)
Glucose, Bld: 133 mg/dL — ABNORMAL HIGH (ref 65–99)
Potassium: 3.8 mmol/L (ref 3.5–5.3)
Sodium: 141 mmol/L (ref 135–146)
Total Bilirubin: 0.3 mg/dL (ref 0.2–1.2)
Total Protein: 7.1 g/dL (ref 6.1–8.1)

## 2019-04-10 LAB — CBC
HCT: 39.2 % (ref 35.0–45.0)
Hemoglobin: 13 g/dL (ref 11.7–15.5)
MCH: 30.2 pg (ref 27.0–33.0)
MCHC: 33.2 g/dL (ref 32.0–36.0)
MCV: 91.2 fL (ref 80.0–100.0)
MPV: 11.2 fL (ref 7.5–12.5)
Platelets: 366 10*3/uL (ref 140–400)
RBC: 4.3 10*6/uL (ref 3.80–5.10)
RDW: 13.8 % (ref 11.0–15.0)
WBC: 7.1 10*3/uL (ref 3.8–10.8)

## 2019-04-10 LAB — AMYLASE: Amylase: 68 U/L (ref 21–101)

## 2019-04-10 LAB — LIPASE: Lipase: 78 U/L — ABNORMAL HIGH (ref 7–60)

## 2019-04-10 MED ORDER — MONTELUKAST SODIUM 10 MG PO TABS: ORAL_TABLET | ORAL | 0 refills | Status: DC

## 2019-04-10 NOTE — Assessment & Plan Note (Signed)
Historical mild elevations in lipase. In addition to the treatment plan outlined in the visit I am going to add a right upper quadrant ultrasound looking for cholelithiasis.

## 2019-04-10 NOTE — Addendum Note (Signed)
Addended by: Silverio Decamp on: 04/10/2019 08:33 AM   Modules accepted: Orders

## 2019-04-11 ENCOUNTER — Ambulatory Visit (INDEPENDENT_AMBULATORY_CARE_PROVIDER_SITE_OTHER): Payer: Medicare Other

## 2019-04-11 ENCOUNTER — Encounter: Payer: Self-pay | Admitting: Sports Medicine

## 2019-04-11 ENCOUNTER — Other Ambulatory Visit: Payer: Self-pay

## 2019-04-11 ENCOUNTER — Ambulatory Visit: Payer: Medicare Other | Admitting: Sports Medicine

## 2019-04-11 DIAGNOSIS — K861 Other chronic pancreatitis: Secondary | ICD-10-CM | POA: Diagnosis not present

## 2019-04-11 NOTE — Progress Notes (Signed)
Pt has seen results on MyChart and message also sent for patient to call back if any questions.

## 2019-04-12 ENCOUNTER — Telehealth: Payer: Self-pay | Admitting: Physician Assistant

## 2019-04-12 NOTE — Telephone Encounter (Signed)
Let her know Dr. Darene Lamer released her.  She can see Dr. Georgina Snell next week for hip injection and possible to order MRI. I will be out town.

## 2019-04-12 NOTE — Telephone Encounter (Signed)
Patient called and wants to know if she is still having headaches and wants to know if Dr. Darene Lamer would order a x-ray for the head since she is having headaches for a while now. She reports that she had family history of Aneurysm and wants to get this checked out. She is also having hip pain and wants an injection. Dr. Darene Lamer has dismissed her and she is not allowed to schedule anymore with him. Does she need an OV? Please advise.

## 2019-04-15 NOTE — Telephone Encounter (Signed)
Patient was notified and she does not want to schedule with Dr. Georgina Snell at this time since she does not have a lot of money coming in and her husband is in the hospital. She will call back at a later time for an appointment. She was very upset when she was not able to schedule with Dr. Darene Lamer anymore.

## 2019-04-17 ENCOUNTER — Ambulatory Visit (INDEPENDENT_AMBULATORY_CARE_PROVIDER_SITE_OTHER): Payer: Medicare Other | Admitting: Family Medicine

## 2019-04-17 ENCOUNTER — Encounter: Payer: Self-pay | Admitting: Family Medicine

## 2019-04-17 VITALS — BP 138/67 | HR 91 | Temp 98.6°F | Wt 196.0 lb

## 2019-04-17 DIAGNOSIS — R748 Abnormal levels of other serum enzymes: Secondary | ICD-10-CM | POA: Diagnosis not present

## 2019-04-17 DIAGNOSIS — M7062 Trochanteric bursitis, left hip: Secondary | ICD-10-CM

## 2019-04-17 DIAGNOSIS — M7061 Trochanteric bursitis, right hip: Secondary | ICD-10-CM

## 2019-04-17 NOTE — Progress Notes (Signed)
Janet Richards is a 58 y.o. female who presents to [place] today for hip pain.  Kiki notes bilateral hip pain.  Pain is located the lateral hips and worse when she lays on her side and with activity.  She denies any injury.  This is been ongoing for a while but worsening recently.  She denies fevers chills nausea vomiting or diarrhea.  She feels well otherwise.  Pain is been stable recently.  She started over-the-counter medications for pain control which helped a bit.  Recently patient had evaluation and work-up finding persistently elevated liver enzymes and lipase.  She does not have current abdominal pain.  She had follow-up ultrasound which showed steatohepatitis.  ROS:  As above  Exam:  BP 138/67   Pulse 91   Temp 98.6 F (37 C) (Oral)   Wt 196 lb (88.9 kg)   LMP 09/24/2017   BMI 34.72 kg/m  Wt Readings from Last 5 Encounters:  04/17/19 196 lb (88.9 kg)  04/09/19 196 lb (88.9 kg)  03/20/19 194 lb (88 kg)  02/25/19 196 lb (88.9 kg)  01/22/19 188 lb 3.2 oz (85.4 kg)   General: Well Developed, well nourished, and in no acute distress.  Neuro/Psych: Alert and oriented x3, extra-ocular muscles intact, able to move all 4 extremities, sensation grossly intact. Skin: Warm and dry, no rashes noted.  Respiratory: Not using accessory muscles, speaking in full sentences, trachea midline.  Cardiovascular: Pulses palpable, no extremity edema. Abdomen: Does not appear distended. MSK: Right hip normal-appearing normal motion tender palpation greater trochanter.  Hip adduction strength 4/5. Left hip: Normal-appearing normal motion tender palpation greater trochanter hip abduction strength 4/5. Normal gait.   Hip greater trochanteric injection: left Consent obtained and timeout performed. Area of maximum tenderness palpated and identified. Skin cleaned with alcohol, cold spray applied. A spinal needle was used to access the greater  trochanteric bursa. 80 mg of Depo-Medrol, and 4 mL of Marcaine were used to inject the trochanteric bursa. Patient tolerated the procedure well.     Assessment and Plan: 57 y.o. female with bilateral hip pain thought to be due to trochanteric bursitis.  Plan for left sided injection and home exercise program recheck in 2 weeks we will proceed with right-sided injection at that time.  Transaminitis and elevated pancreatic enzymes.  Intermittently elevated.  He is reasonable to proceed with further work-up here.  However prior to proceeding with expensive and relatively large dose radiation of CT scan of abdomen I is reasonable to have a evaluation with gastroenterology.  She may benefit from upper endoscopy first.  Will refer to GI and evaluate on recheck sooner if needed.   PDMP not reviewed this encounter. Orders Placed This Encounter  Procedures  . Ambulatory referral to Gastroenterology    Referral Priority:   Routine    Referral Type:   Consultation    Referral Reason:   Specialty Services Required    Number of Visits Requested:   1   No orders of the defined types were placed in this encounter.   Historical information moved to improve visibility of documentation.  Past Medical History:  Diagnosis Date  . Asthma   . Essential hypertension, benign   . Menstrual migraine   . Periodontitis   . Prediabetes 10/17/2014  . Primary osteoarthritis of both knees   . Sinusitis   . Urticaria    Past Surgical History:  Procedure Laterality Date  . TUBAL LIGATION  12/26/1987  Social History   Tobacco Use  . Smoking status: Former Smoker    Packs/day: 1.50    Types: Cigarettes    Last attempt to quit: 02/05/2006    Years since quitting: 13.2  . Smokeless tobacco: Never Used  Substance Use Topics  . Alcohol use: Yes    Comment: rarely   family history includes Allergic rhinitis in her father and son; Asthma in her son; CAD in her father; Hypertension in her mother; Urticaria  in her sister.  Medications: Current Outpatient Medications  Medication Sig Dispense Refill  . azelastine (OPTIVAR) 0.05 % ophthalmic solution Place 1 drop into both eyes 2 (two) times daily. 6 mL 5  . cetirizine HCl (ZYRTEC) 1 MG/ML solution TAKE 10 MLS (10 MG TOTAL) BY MOUTH DAILY. 944 mL 1  . chlorhexidine gluconate, MEDLINE KIT, (PERIDEX) 0.12 % solution Use as directed 15 mLs in the mouth or throat 2 (two) times daily. 120 mL 1  . conjugated estrogens (PREMARIN) vaginal cream Place 1 Applicatorful vaginally daily. For first 2 weeks then decrease to 3 times a week. 42.5 g 12  . cyclobenzaprine (FLEXERIL) 10 MG tablet Take 1 tablet (10 mg total) by mouth 3 (three) times daily as needed for muscle spasms. 30 tablet 0  . hydrochlorothiazide (HYDRODIURIL) 25 MG tablet TAKE 1 TABLET BY MOUTH EVERY DAY 90 tablet 1  . ipratropium (ATROVENT) 0.06 % nasal spray Place 2 sprays into both nostrils 4 (four) times daily.    . Ipratropium-Albuterol (COMBIVENT) 20-100 MCG/ACT AERS respimat Inhale 1 puff into the lungs every 6 (six) hours as needed for wheezing or shortness of breath. 1 Inhaler 1  . IRON, FERROUS SULFATE, PO Take by mouth.    [name] L-Methylfolate-B6-B12 (FOLTANX) 3-35-2 MG TABS Take 1 tablet by mouth 2 (two) times daily. 60 tablet 11  . megestrol (MEGACE) 40 MG tablet Take 1 tablet (40 mg total) by mouth 2 (two) times daily. When bleeding slows, take 1 per day 90 tablet 0  . mirabegron ER (MYRBETRIQ) 25 MG TB24 tablet Take 1 tablet (25 mg total) by mouth daily. 30 tablet 3  . montelukast (SINGULAIR) 10 MG tablet Take 1 tablet once a day for coughing or wheezing. 90 tablet 0  . Multiple Vitamins-Minerals (MULTIVITAMIN ADULT PO) Take by mouth.    . Olopatadine HCl 0.2 % SOLN Place 1 drop into both eyes daily. 3 mL 5  . pantoprazole (PROTONIX) 40 MG tablet Take 1 tablet (40 mg total) by mouth daily. 60 tablet 3  . Probiotic Product (PROBIOTIC-10) CAPS Take by mouth.    . triamcinolone (KENALOG)  0.1 % paste Use as directed 1 application in the mouth or throat 3 (three) times daily. 5 g 1  . triamcinolone (NASACORT) 55 MCG/ACT AERO nasal inhaler Place 2 sprays into the nose daily. 1 Inhaler 6   No current facility-administered medications for this visit.    Allergies  Allergen Reactions  . Azithromycin Nausea And Vomiting  . Clindamycin/Lincomycin Hives    Facial swelling  . Penicillins Swelling and Other (See Comments)    Patient states does not work for her.  She denies swelling or hives from penicillin. She states penicillin not effective for infections. Has taken cephalosporins without problems  . Sulfa Antibiotics Hives      Discussed warning signs or symptoms. Please see discharge instructions. Patient expresses understanding.

## 2019-04-17 NOTE — Patient Instructions (Addendum)
Thank you for coming in today. Work on the side leg raises. Recheck in 2 weeks.  Return sooner if needed.  You should hear about the stomach doctor referral.  Call or go to the ER if you develop a large red swollen joint with extreme pain or oozing puss.     Trochanteric Bursitis Rehab Ask your health care provider which exercises are safe for you. Do exercises exactly as told by your health care provider and adjust them as directed. It is normal to feel mild stretching, pulling, tightness, or discomfort as you do these exercises, but you should stop right away if you feel sudden pain or your pain gets worse.Do not begin these exercises until told by your health care provider. Stretching exercises These exercises warm up your muscles and joints and improve the movement and flexibility of your hip. These exercises also help to relieve pain and stiffness. Exercise A: Iliotibial band stretch  1. Lie on your side with your left / right leg in the top position. 2. Bend your left / right knee and grab your ankle. 3. Slowly bring your knee back so your thigh is behind your body. 4. Slowly lower your knee toward the floor until you feel a gentle stretch on the outside of your left / right thigh. If you do not feel a stretch and your knee will not fall farther, place the heel of your other foot on top of your outer knee and pull your thigh down farther. 5. Hold this position for __________ seconds. 6. Slowly return to the starting position. Repeat __________ times. Complete this exercise __________ times a day. Strengthening exercises These exercises build strength and endurance in your hip and pelvis. Endurance is the ability to use your muscles for a long time, even after they get tired. Exercise B: Bridge (hip extensors)  1. Lie on your back on a firm surface with your knees bent and your feet flat on the floor. 2. Tighten your buttocks muscles and lift your buttocks off the floor until your  trunk is level with your thighs. You should feel the muscles working in your buttocks and the back of your thighs. If this exercise is too easy, try doing it with your arms crossed over your chest. 3. Hold this position for __________ seconds. 4. Slowly return to the starting position. 5. Let your muscles relax completely between repetitions. Repeat __________ times. Complete this exercise __________ times a day. Exercise C: Squats (knee extensors and  quadriceps) 1. Stand in front of a table, with your feet and knees pointing straight ahead. You may rest your hands on the table for balance but not for support. 2. Slowly bend your knees and lower your hips like you are going to sit in a chair. ? Keep your weight over your heels, not over your toes. ? Keep your lower legs upright so they are parallel with the table legs. ? Do not let your hips go lower than your knees. ? Do not bend lower than told by your health care provider. ? If your hip pain increases, do not bend as low. 3. Hold this position for __________ seconds. 4. Slowly push with your legs to return to standing. Do not use your hands to pull yourself to standing. Repeat __________ times. Complete this exercise __________ times a day. Exercise D: Hip hike 1. Stand sideways on a bottom step. Stand on your left / right leg with your other foot unsupported next to the step. You can  hold onto the railing or wall if needed for balance. 2. Keeping your knees straight and your torso square, lift your left / right hip up toward the ceiling. 3. Hold this position for __________ seconds. 4. Slowly let your left / right hip lower toward the floor, past the starting position. Your foot should get closer to the floor. Do not lean or bend your knees. Repeat __________ times. Complete this exercise __________ times a day. Exercise E: Single leg stand 1. Stand near a counter or door frame that you can hold onto for balance as needed. It is helpful  to stand in front of a mirror for this exercise so you can watch your hip. 2. Squeeze your left / right buttock muscles then lift up your other foot. Do not let your left / right hip push out to the side. 3. Hold this position for __________ seconds. Repeat __________ times. Complete this exercise __________ times a day. This information is not intended to replace advice given to you by your health care provider. Make sure you discuss any questions you have with your health care provider. Document Released: 12/01/2004 Document Revised: 06/30/2016 Document Reviewed: 10/09/2015 Elsevier Interactive Patient Education  2019 [employer].

## 2019-04-18 ENCOUNTER — Encounter: Payer: Self-pay | Admitting: Gastroenterology

## 2019-04-18 ENCOUNTER — Telehealth (INDEPENDENT_AMBULATORY_CARE_PROVIDER_SITE_OTHER): Payer: Medicare Other | Admitting: Gastroenterology

## 2019-04-18 ENCOUNTER — Other Ambulatory Visit: Payer: Self-pay

## 2019-04-18 VITALS — Ht 63.0 in | Wt 196.0 lb

## 2019-04-18 DIAGNOSIS — K219 Gastro-esophageal reflux disease without esophagitis: Secondary | ICD-10-CM | POA: Diagnosis not present

## 2019-04-18 DIAGNOSIS — R748 Abnormal levels of other serum enzymes: Secondary | ICD-10-CM

## 2019-04-18 DIAGNOSIS — R740 Nonspecific elevation of levels of transaminase and lactic acid dehydrogenase [LDH]: Secondary | ICD-10-CM | POA: Diagnosis not present

## 2019-04-18 DIAGNOSIS — R1012 Left upper quadrant pain: Secondary | ICD-10-CM | POA: Diagnosis not present

## 2019-04-18 DIAGNOSIS — R7401 Elevation of levels of liver transaminase levels: Secondary | ICD-10-CM

## 2019-04-18 DIAGNOSIS — K760 Fatty (change of) liver, not elsewhere classified: Secondary | ICD-10-CM

## 2019-04-18 NOTE — Progress Notes (Signed)
Chief Complaint: Elevated liver enzymes, elevated lipase  Referring Provider:     [name], MD  HPI:    Due to current restrictions/limitations of in-office visits due to the COVID-19 pandemic, this scheduled clinical appointment was converted to a telehealth virtual consultation using D[employer].  -Time of medical discussion: 45 minutes -The patient did consent to this virtual visit and is aware of possible charges through their insurance for this visit.  -Names of all parties present: Janet Richards (patient), Gerrit Heck, DO, [facility] (physician) -Patient location: Home -Physician location: Office  Janet Richards is a 58 y.o. female referred to the [facility]astroenterology [facility] for evaluation of elevated liver enzymes, elevated lipase, and RUQ US demonstrating steatosis.  Recent evaluation notable for the following: - RUQ [place] (04/2019): Fatty liver was otherwise normal ducts, normal visualized pancreas - Lipase 78.  Was 67 2017, and normal in the interim - Normal CBC and amylase - ALT 47, previously normal, otherwise normal CMP. - 2019: HCV negative, normal TSH, normal iron panel (iron deficiency without anemia in 2017) -2016: HIV negative - Liver elastography ordered by referring provider  Aside from prior hx of gallstone disease years ago (no GB stones on recent RUQ [place]), she denies any known personal or FHx of liver or pancreaticobiliary disease.  She denies any history of ascites, jaundice, icteric sclera, GI bleed.  No prior history of viral hepatitis.  Previous smoker, quit in 2007. No hx of pancreatitis.   Does endorse intermittent LUQ pain/subcostal, non-radiating. Sharp pain that lasts <1 minute. Occurs a couple times/month at most. Not related to PO intake. No nocturnal sxs.   Does have intermittent nausea without emesis, which occurs sporadic and can be independent of the pain.   Separately, hx of reflux which is well controlled with Protonix- takes  on-demand. Will occasionally use Tums.   Colonoscopy approx 4-5 years ago, normal per patient (diverticulosis), and was told to repeat in 10 years. No prior EGD.   Past medical history, past surgical history, social history, family history, medications, and allergies reviewed in the chart and with patient.    Past Medical History:  Diagnosis Date  . Asthma   . Chronic pancreatitis ([place])   . Essential hypertension, benign   . Menstrual migraine   . Periodontitis   . Prediabetes 10/17/2014  . Primary osteoarthritis of both knees   . Sinusitis   . Urticaria      Past Surgical History:  Procedure Laterality Date  . TUBAL LIGATION  12/26/1987   Family History  Problem Relation Age of Onset  . Hypertension Mother   . CAD Father        MI in his 4s  . Allergic rhinitis Father   . Urticaria Sister   . Colon cancer Sister   . Allergic rhinitis Son   . Asthma Son   . Angioedema Neg Hx   . Eczema Neg Hx   . Immunodeficiency Neg Hx    Social History   Tobacco Use  . Smoking status: Former Smoker    Packs/day: 1.50    Types: Cigarettes    Quit date: 02/05/2006    Years since quitting: 13.2  . Smokeless tobacco: Never Used  Substance Use Topics  . Alcohol use: Yes    Comment: rarely  . Drug use: No   Current Outpatient Medications  Medication Sig Dispense Refill  . azelastine (OPTIVAR) 0.05 % ophthalmic solution Place  1 drop into both eyes 2 (two) times daily. 6 mL 5  . cetirizine HCl (ZYRTEC) 1 MG/ML solution TAKE 10 MLS (10 MG TOTAL) BY MOUTH DAILY. 944 mL 1  . chlorhexidine gluconate, MEDLINE KIT, (PERIDEX) 0.12 % solution Use as directed 15 mLs in the mouth or throat 2 (two) times daily. 120 mL 1  . conjugated estrogens (PREMARIN) vaginal cream Place 1 Applicatorful vaginally daily. For first 2 weeks then decrease to 3 times a week. 42.5 g 12  . cyclobenzaprine (FLEXERIL) 10 MG tablet Take 1 tablet (10 mg total) by mouth 3 (three) times daily as needed for muscle  spasms. 30 tablet 0  . hydrochlorothiazide (HYDRODIURIL) 25 MG tablet TAKE 1 TABLET BY MOUTH EVERY DAY 90 tablet 1  . ipratropium (ATROVENT) 0.06 % nasal spray Place 2 sprays into both nostrils 4 (four) times daily.    . Ipratropium-Albuterol (COMBIVENT) 20-100 MCG/ACT AERS respimat Inhale 1 puff into the lungs every 6 (six) hours as needed for wheezing or shortness of breath. 1 Inhaler 1  . IRON, FERROUS SULFATE, PO Take by mouth.    [name] L-Methylfolate-B6-B12 (FOLTANX) 3-35-2 MG TABS Take 1 tablet by mouth 2 (two) times daily. 60 tablet 11  . megestrol (MEGACE) 40 MG tablet Take 1 tablet (40 mg total) by mouth 2 (two) times daily. When bleeding slows, take 1 per day 90 tablet 0  . mirabegron ER (MYRBETRIQ) 25 MG TB24 tablet Take 1 tablet (25 mg total) by mouth daily. 30 tablet 3  . montelukast (SINGULAIR) 10 MG tablet Take 1 tablet once a day for coughing or wheezing. 90 tablet 0  . Multiple Vitamins-Minerals (MULTIVITAMIN ADULT PO) Take by mouth.    . Olopatadine HCl 0.2 % SOLN Place 1 drop into both eyes daily. 3 mL 5  . pantoprazole (PROTONIX) 40 MG tablet Take 1 tablet (40 mg total) by mouth daily. 60 tablet 3  . Probiotic Product (PROBIOTIC-10) CAPS Take by mouth.    . triamcinolone (KENALOG) 0.1 % paste Use as directed 1 application in the mouth or throat 3 (three) times daily. 5 g 1  . triamcinolone (NASACORT) 55 MCG/ACT AERO nasal inhaler Place 2 sprays into the nose daily. 1 Inhaler 6   No current facility-administered medications for this visit.    Allergies  Allergen Reactions  . Azithromycin Nausea And Vomiting  . Clindamycin/Lincomycin Hives    Facial swelling  . Penicillins Swelling and Other (See Comments)    Patient states does not work for her.  She denies swelling or hives from penicillin. She states penicillin not effective for infections. Has taken cephalosporins without problems  . Sulfa Antibiotics Hives     Review of Systems: All systems reviewed and negative  except where noted in HPI.     Physical Exam:    Complete physical exam not completed due to the nature of this telehealth communication.   Gen: Awake, alert, and oriented, and well communicative. HEENT: EOMI, non-icteric sclera, NCAT, MMM Neck: Normal movement of head and neck Pulm: No labored breathing, speaking in full sentences without conversational dyspnea Derm: No apparent lesions or bruising in visible field MS: Moves all visible extremities without noticeable abnormality Psych: Pleasant, cooperative, normal speech, thought processing seemingly intact   ASSESSMENT AND PLAN;   1) Elevated lipase: Intermittently, mildly elevated lipase without a history of pancreatitis or hepatobiliary disease (aside from reported history of GB stones).  RUQ [place] with hepatic steatosis, otherwise no biliary dilation and normal visualized pancreas.  Discussed  at length and will plan for the following:  -EGD to assess for PUD, duodenal ulcers, Celiac disease, which could be associated with mildly elevated serum lipase - Reviewed medication list. Possibly 2/2 HCTZ, hormone therapy - HCV negative, HIV negative - If EGD unrevealing, plan for repeat lipase in 3 months, and if still elevated, can plan for cross-sectional imaging with either CT or MRI pancreas  2) NAFLD: Mildly elevated ALT was otherwise normal AST, ALP, T bili. RUQ [place] that was significant for fatty liver infiltration. Given these findings, her body habitus (overweight) and history of metabolic syndrome comorbidities (HTN, IFG), favor NAFLD as the underlying etiology for elevated LAEs.   - Given the presumed diagnosis of fatty liver disease, I extensively counseled the patient on the importance of diet and exercise, with a modest weight loss of 10% of total body weight, done slowly over weeks to months - Holding off on additional serologic evaluation given very mildly elevated ALT, high clinical suspicion for NAFLD, and trial of  diet/exercise as above -Repeat LAE's in 3 to 6 months.  If uptrending, can perform extended serologic evaluation at a time  3) LUQ Pain: Intermittent, sharp, LUQ/left subcostal pain.  Suspect benign MSK etiology, but given elevated lipase, elevated patient concerns, evaluate for mucosal/luminal etiology with EGD  -EGD with gastric biopsies - Performed duodenal biopsies to rule out Celiac as possible cause for elevated lipase as above  4) GERD: -Well-controlled on Protonix -Can evaluate for erosive esophagitis, LES laxity, hiatal hernia, Barrett's screening, etc. time of EGD as above  - To follow-up with me in 3-6 months or sooner prn  The indications, risks, and benefits of EGD were explained to the patient in detail. Risks include but are not limited to bleeding, perforation, adverse reaction to medications, and cardiopulmonary compromise. Sequelae include but are not limited to the possibility of surgery, hositalization, and mortality. The patient verbalized understanding and wished to proceed. All questions answered, referred to scheduler. Further recommendations pending results of the exam.   I spent a total of 45 minutes of time with the patient. Greater than 50% of the time was spent counseling and coordinating care.    Lavena Bullion, DO, FACG  04/18/2019, 10:14 AM   [name], MD

## 2019-04-18 NOTE — Patient Instructions (Signed)
If you are age 56 or older, your body mass index should be between 23-30. Your Body mass index is 34.72 kg/m. If this is out of the aforementioned range listed, please consider follow up with your Primary Care Provider.  If you are age 39 or younger, your body mass index should be between 19-25. Your Body mass index is 34.72 kg/m. If this is out of the aformentioned range listed, please consider follow up with your Primary Care Provider.   To help prevent the possible spread of infection to our patients, communities, and staff; we will be implementing the following measures:  As of now we are not allowing any visitors/family members to accompany you to any upcoming appointments with [facility]. If you have any concerns about this please contact our office to discuss prior to the appointment.   You have been scheduled for an endoscopy. Please follow written instructions given to you at your visit today. If you use inhalers (even only as needed), please bring them with you on the day of your procedure. Your physician has requested that you go to www.startemmi.com and enter the access code given to you at your visit today. This web site gives a general overview about your procedure. However, you should still follow specific instructions given to you by our office regarding your preparation for the procedure.  Your provider has requested that in 3 months you go to the basement level for lab work at our [place] location ([address] [place] [address]) . Press "B" on the elevator. The lab is located at the first door on the left as you exit the elevator. You may go at whatever time is convienent for you. The current hours of operations are Monday- Friday 7:30am-4:30pm.  Please call our office at [phone] to set up your 3-6 month follow up visit.  It was a pleasure to see you today!  Vito Cirigliano, D.O.

## 2019-04-19 ENCOUNTER — Telehealth: Payer: Self-pay

## 2019-04-19 NOTE — Telephone Encounter (Signed)
Patient called this morning to give you a little bit more information on what is going on with her. She states that her skin is noticeably darker and she has not been in the sun. She also states that she has been having pain around her belly button.

## 2019-04-19 NOTE — Telephone Encounter (Signed)
Thank you. Would still plan for the EGD with biopsies as discussed. If EGD unrevealing and ongoing sxs, can consider alternate testing to include imaging, urinary PBG, etc. Would also recommend f/u with her PCM re: skin color changes. Thanks.

## 2019-04-22 ENCOUNTER — Telehealth: Payer: Self-pay | Admitting: *Deleted

## 2019-04-22 NOTE — Telephone Encounter (Signed)
No answer for second attempt covid screen. SM

## 2019-04-22 NOTE — Telephone Encounter (Signed)
No answer/ ring unable to leave patient a message for COVID screen, will try again. SM

## 2019-04-23 ENCOUNTER — Ambulatory Visit (AMBULATORY_SURGERY_CENTER): Payer: Medicare Other | Admitting: Gastroenterology

## 2019-04-23 ENCOUNTER — Telehealth: Payer: Self-pay | Admitting: Gastroenterology

## 2019-04-23 ENCOUNTER — Encounter: Payer: Self-pay | Admitting: Gastroenterology

## 2019-04-23 ENCOUNTER — Other Ambulatory Visit: Payer: Self-pay

## 2019-04-23 ENCOUNTER — Telehealth: Payer: Self-pay | Admitting: *Deleted

## 2019-04-23 VITALS — BP 131/92 | HR 84 | Temp 98.5°F | Resp 19 | Ht 75.0 in | Wt 196.0 lb

## 2019-04-23 DIAGNOSIS — K2900 Acute gastritis without bleeding: Secondary | ICD-10-CM

## 2019-04-23 DIAGNOSIS — R748 Abnormal levels of other serum enzymes: Secondary | ICD-10-CM

## 2019-04-23 DIAGNOSIS — K219 Gastro-esophageal reflux disease without esophagitis: Secondary | ICD-10-CM

## 2019-04-23 DIAGNOSIS — R1012 Left upper quadrant pain: Secondary | ICD-10-CM

## 2019-04-23 DIAGNOSIS — K449 Diaphragmatic hernia without obstruction or gangrene: Secondary | ICD-10-CM

## 2019-04-23 DIAGNOSIS — K2980 Duodenitis without bleeding: Secondary | ICD-10-CM

## 2019-04-23 MED ORDER — SODIUM CHLORIDE 0.9 % IV SOLN: 500.0000 mL | Freq: Once | INTRAVENOUS | Status: DC

## 2019-04-23 NOTE — Progress Notes (Signed)
Report given to PACU, vss 

## 2019-04-23 NOTE — Patient Instructions (Signed)
Thank for allowing [place] to care for you today!  Resume previous diet and medications today.  Continue to take Protonix as before, daily.  Return to your normal activities tomorrow.  Await pathology results by mail, approximately 2 weeks.    YOU HAD AN ENDOSCOPIC PROCEDURE TODAY AT [facility]:   Refer to the procedure report that was given to you for any specific questions about what was found during the examination.  If the procedure report does not answer your questions, please call your gastroenterologist to clarify.  If you requested that your care partner not be given the details of your procedure findings, then the procedure report has been included in a sealed envelope for you to review at your convenience later.  YOU SHOULD EXPECT: Some feelings of bloating in the abdomen. Passage of more gas than usual.  Walking can help get rid of the air that was put into your GI tract during the procedure and reduce the bloating. If you had a lower endoscopy (such as a colonoscopy or flexible sigmoidoscopy) you may notice spotting of blood in your stool or on the toilet paper. If you underwent a bowel prep for your procedure, you may not have a normal bowel movement for a few days.  Please Note:  You might notice some irritation and congestion in your nose or some drainage.  This is from the oxygen used during your procedure.  There is no need for concern and it should clear up in a day or so.  SYMPTOMS TO REPORT IMMEDIATELY:     Following upper endoscopy (EGD)  Vomiting of blood or coffee ground material  New chest pain or pain under the shoulder blades  Painful or persistently difficult swallowing  New shortness of breath  Fever of 100F or higher  Black, tarry-looking stools  For urgent or emergent issues, a gastroenterologist can be reached at any hour by calling [phone].   DIET:  We do recommend a small meal at first, but then you may proceed to your regular  diet.  Drink plenty of fluids but you should avoid alcoholic beverages for 24 hours.  ACTIVITY:  You should plan to take it easy for the rest of today and you should NOT DRIVE or use heavy machinery until tomorrow (because of the sedation medicines used during the test).    FOLLOW UP: Our staff will call the number listed on your records 48-72 hours following your procedure to check on you and address any questions or concerns that you may have regarding the information given to you following your procedure. If we do not reach you, we will leave a message.  We will attempt to reach you two times.  During this call, we will ask if you have developed any symptoms of COVID 19. If you develop any symptoms (ie: fever, flu-like symptoms, shortness of breath, cough etc.) before then, please call [phone].  If you test positive for Covid 19 in the 2 weeks post procedure, please call and report this information to [place].    If any biopsies were taken you will be contacted by phone or by letter within the next 1-3 weeks.  Please call us at [phone] if you have not heard about the biopsies in 3 weeks.    SIGNATURES/CONFIDENTIALITY: You and/or your care partner have signed paperwork which will be entered into your electronic medical record.  These signatures attest to the fact that that the information above on your After Visit Summary  has been reviewed and is understood.  Full responsibility of the confidentiality of this discharge information lies with you and/or your care-partner. 

## 2019-04-23 NOTE — Op Note (Signed)
[facility] Patient Name: Janet Richards Procedure Date: 04/23/2019 1:32 PM MRN: [id number] Endoscopist: Gerrit Heck , MD Age: 58 Referring MD:  Date of Birth: 06-20-1961 Gender: Female Account #: [id number] Procedure:                Upper GI endoscopy Indications:              Abdominal pain in the left upper quadrant,                            Suspected esophageal reflux, Nausea, Elevated lipase Medicines:                Monitored Anesthesia Care Procedure:                Pre-Anesthesia Assessment:                           - Prior to the procedure, a History and Physical                            was performed, and patient medications and                            allergies were reviewed. The patient's tolerance of                            previous anesthesia was also reviewed. The risks                            and benefits of the procedure and the sedation                            options and risks were discussed with the patient.                            All questions were answered, and informed consent                            was obtained. Prior Anticoagulants: The patient has                            taken no previous anticoagulant or antiplatelet                            agents. ASA Grade Assessment: II - A patient with                            mild systemic disease. After reviewing the risks                            and benefits, the patient was deemed in                            satisfactory condition to undergo the procedure.  After obtaining informed consent, the endoscope was                            passed under direct vision. Throughout the                            procedure, the patient's blood pressure, pulse, and                            oxygen saturations were monitored continuously. The                            Model GIF-HQ190 [id number]) scope was introduced                            through  the mouth, and advanced to the second part                            of duodenum. The upper GI endoscopy was                            accomplished without difficulty. The patient                            tolerated the procedure well. Scope In: Scope Out: Findings:                 The examined esophagus was normal.                           The Z-line was regular and was found 40 cm from the                            incisors.                           The gastroesophageal flap valve was visualized                            endoscopically and classified as Hill Grade II                            (fold present, opens with respiration).                           Localized mild inflammation characterized by                            erythema was found in the gastric body. No ulcers                            or erosions noted. Biopsies were taken with a cold                            forceps for Helicobacter pylori testing. Estimated  blood loss was minimal.                           The incisura, gastric antrum and pylorus were                            normal.                           The duodenal bulb, first portion of the duodenum                            and second portion of the duodenum were normal.                            Biopsies for histology were taken with a cold                            forceps for evaluation of celiac disease. Estimated                            blood loss was minimal. Complications:            No immediate complications. Estimated Blood Loss:     Estimated blood loss was minimal. Impression:               - Normal esophagus.                           - Z-line regular, 40 cm from the incisors.                           - Gastroesophageal flap valve classified as Hill                            Grade II (fold present, opens with respiration).                           - Gastritis. Biopsied.                            - Normal incisura, antrum and pylorus.                           - Normal duodenal bulb, first portion of the                            duodenum and second portion of the duodenum.                            Biopsied. Recommendation:           - Patient has a contact number available for                            emergencies. The signs and symptoms of potential  delayed complications were discussed with the                            patient. Return to normal activities tomorrow.                            Written discharge instructions were provided to the                            patient.                           - Resume previous diet.                           - Continue present medications.                           - Await pathology results.                           - Resume Protonix as currently taking for reflux                            symptoms.                           - Return to GI office PRN. Gerrit Heck, MD 04/23/2019 2:30:56 PM

## 2019-04-23 NOTE — Telephone Encounter (Signed)
Pt called and had questions about her procedure and her care partner. Answered and advised and pt verbalized understanding. SM

## 2019-04-23 NOTE — Progress Notes (Signed)
Pt's states no medical or surgical changes since previsit or office visit. 

## 2019-04-23 NOTE — Progress Notes (Signed)
Riki Sheer took temp and Rica Mote took vitals.

## 2019-04-25 ENCOUNTER — Telehealth: Payer: Self-pay | Admitting: *Deleted

## 2019-04-25 NOTE — Telephone Encounter (Signed)
  Follow up Call-  Call back number 04/23/2019  Post procedure Call Back phone  # [phone]  Permission to leave phone message Yes  Some recent data might be hidden     Patient questions:  Do you have a fever, pain , or abdominal swelling? No. Pain Score  0 *  Have you tolerated food without any problems? Yes.    Have you been able to return to your normal activities? Yes.    Do you have any questions about your discharge instructions: Diet   No. Medications  No. Follow up visit  No.  Do you have questions or concerns about your Care? No.  Actions: * If pain score is 4 or above: No action needed, pain <4.   1. Have you developed a fever since your procedure? NO  2.   Have you had an respiratory symptoms (SOB or cough) since your procedure? NO  3.   Have you tested positive for COVID 19 since your procedure NO  4.   Have you had any family members/close contacts diagnosed with the COVID 19 since your procedure?  NO   If yes to any of these questions please route to Joylene John, RN and Alphonsa Gin, RN.

## 2019-04-26 ENCOUNTER — Encounter: Payer: Self-pay | Admitting: Gastroenterology

## 2019-04-30 ENCOUNTER — Ambulatory Visit: Payer: Medicare Other | Admitting: Family Medicine

## 2019-04-30 ENCOUNTER — Ambulatory Visit (INDEPENDENT_AMBULATORY_CARE_PROVIDER_SITE_OTHER): Payer: Medicare Other | Admitting: Family Medicine

## 2019-04-30 ENCOUNTER — Encounter: Payer: Self-pay | Admitting: Family Medicine

## 2019-04-30 VITALS — BP 135/59 | HR 85 | Temp 98.3°F | Wt 193.0 lb

## 2019-04-30 DIAGNOSIS — M7061 Trochanteric bursitis, right hip: Secondary | ICD-10-CM

## 2019-04-30 DIAGNOSIS — M7062 Trochanteric bursitis, left hip: Secondary | ICD-10-CM

## 2019-04-30 DIAGNOSIS — K7581 Nonalcoholic steatohepatitis (NASH): Secondary | ICD-10-CM | POA: Diagnosis not present

## 2019-04-30 NOTE — Progress Notes (Deleted)
Janet Richards is a 58 y.o. female who presents to [facility] today for *** Follow-up hip pain and discuss skin change in bellybutton.  Patient had EGD and notes following then hyperpigmented skin at umbilicus. ***  Patient had left trochanteric bursa injection on 04/17/19.  At visit she had bilateral hip pain but a decision was made to delay the right injection for a few weeks to minimize total dose of steroid.  ROS as above:  Exam:  LMP 09/24/2017  Wt Readings from Last 5 Encounters:  04/23/19 196 lb (88.9 kg)  04/18/19 196 lb (88.9 kg)  04/17/19 196 lb (88.9 kg)  04/09/19 196 lb (88.9 kg)  03/20/19 194 lb (88 kg)    Gen: Well NAD HEENT: EOMI,  MMM Lungs: Normal work of breathing. CTABL Heart: RRR no MRG Abd: NABS, Soft. Nondistended, Nontender Exts: Brisk capillary refill, warm and well perfused.   Lab and Radiology Results No results found for this or any previous visit (from the past 72 hour(s)). No results found.    Assessment and Plan: 58 y.o. female with ***  PDMP not reviewed this encounter. No orders of the defined types were placed in this encounter.  No orders of the defined types were placed in this encounter.    Historical information moved to improve visibility of documentation.  Past Medical History:  Diagnosis Date  . Asthma   . Chronic pancreatitis ([place])   . Essential hypertension, benign   . Menstrual migraine   . Periodontitis   . Prediabetes 10/17/2014  . Primary osteoarthritis of both knees   . Sinusitis   . Urticaria    Past Surgical History:  Procedure Laterality Date  . TUBAL LIGATION  12/26/1987   Social History   Tobacco Use  . Smoking status: Former Smoker    Packs/day: 1.50    Types: Cigarettes    Quit date: 02/05/2006    Years since quitting: 13.2  . Smokeless tobacco: Never Used  Substance Use  Topics  . Alcohol use: Yes    Comment: rarely   family history includes Allergic rhinitis in her father and son; Asthma in her son; CAD in her father; Colon cancer in her sister; Hypertension in her mother; Urticaria in her sister.  Medications: Current Outpatient Medications  Medication Sig Dispense Refill  . azelastine (OPTIVAR) 0.05 % ophthalmic solution Place 1 drop into both eyes 2 (two) times daily. (Patient not taking: Reported on 04/23/2019) 6 mL 5  . cetirizine HCl (ZYRTEC) 1 MG/ML solution TAKE 10 MLS (10 MG TOTAL) BY MOUTH DAILY. 944 mL 1  . chlorhexidine gluconate, MEDLINE KIT, (PERIDEX) 0.12 % solution Use as directed 15 mLs in the mouth or throat 2 (two) times daily. (Patient not taking: Reported on 04/23/2019) 120 mL 1  . conjugated estrogens (PREMARIN) vaginal cream Place 1 Applicatorful vaginally daily. For first 2 weeks then decrease to 3 times a week. 42.5 g 12  . cyclobenzaprine (FLEXERIL) 10 MG tablet Take 1 tablet (10 mg total) by mouth 3 (three) times daily as needed for muscle spasms. 30 tablet 0  . hydrochlorothiazide (HYDRODIURIL) 25 MG tablet TAKE 1 TABLET BY MOUTH EVERY DAY 90 tablet 1  . ipratropium (ATROVENT) 0.06 % nasal spray Place 2 sprays into both nostrils 4 (four) times daily.    . Ipratropium-Albuterol (COMBIVENT) 20-100 MCG/ACT AERS respimat Inhale 1 puff into the lungs every 6 (six) hours as needed for wheezing or  shortness of breath. 1 Inhaler 1  . IRON, FERROUS SULFATE, PO Take by mouth.    . L-Methylfolate-B6-B12 (FOLTANX) 3-35-2 MG TABS Take 1 tablet by mouth 2 (two) times daily. (Patient not taking: Reported on 04/23/2019) 60 tablet 11  . megestrol (MEGACE) 40 MG tablet Take 1 tablet (40 mg total) by mouth 2 (two) times daily. When bleeding slows, take 1 per day 90 tablet 0  . mirabegron ER (MYRBETRIQ) 25 MG TB24 tablet Take 1 tablet (25 mg total) by mouth daily. 30 tablet 3  . montelukast (SINGULAIR) 10 MG tablet Take 1 tablet once a day for coughing or  wheezing. 90 tablet 0  . Multiple Vitamins-Minerals (MULTIVITAMIN ADULT PO) Take by mouth.    . Olopatadine HCl 0.2 % SOLN Place 1 drop into both eyes daily. 3 mL 5  . pantoprazole (PROTONIX) 40 MG tablet Take 1 tablet (40 mg total) by mouth daily. 60 tablet 3  . Probiotic Product (PROBIOTIC-10) CAPS Take by mouth.    . triamcinolone (KENALOG) 0.1 % paste Use as directed 1 application in the mouth or throat 3 (three) times daily. 5 g 1  . triamcinolone (NASACORT) 55 MCG/ACT AERO nasal inhaler Place 2 sprays into the nose daily. 1 Inhaler 6   Current Facility-Administered Medications  Medication Dose Route Frequency Provider Last Rate Last Dose  . 0.9 %  sodium chloride infusion  500 mL Intravenous Once Cirigliano, Vito V, DO       Allergies  Allergen Reactions  . Azithromycin Nausea And Vomiting  . Clindamycin/Lincomycin Hives    Facial swelling  . Penicillins Swelling and Other (See Comments)    Patient states does not work for her.  She denies swelling or hives from penicillin. She states penicillin not effective for infections. Has taken cephalosporins without problems  . Sulfa Antibiotics Hives     Discussed warning signs or symptoms. Please see discharge instructions. Patient expresses understanding. 

## 2019-04-30 NOTE — Patient Instructions (Addendum)
Thank you for coming in today. Try reducing carbs.  Try getting fewer than 50g per meal.  Try getting less than 150g of carbs per day.  Keep working on side leg raises and stretching.  Keep me updated. If the hip pain does not improve or returns we may consider formal physical therapy.    Fatty Liver Disease  Fatty liver disease occurs when too much fat has built up in your liver cells. Fatty liver disease is also called hepatic steatosis or steatohepatitis. The liver removes harmful substances from your bloodstream and produces fluids that your body needs. It also helps your body use and store energy from the food you eat. In many cases, fatty liver disease does not cause symptoms or problems. It is often diagnosed when tests are being done for other reasons. However, over time, fatty liver can cause inflammation that may lead to more serious liver problems, such as scarring of the liver (cirrhosis) and liver failure. Fatty liver is associated with insulin resistance, increased body fat, high blood pressure (hypertension), and high cholesterol. These are features of metabolic syndrome and increase your risk for stroke, diabetes, and heart disease. What are the causes? This condition may be caused by:  Drinking too much alcohol.  Poor nutrition.  Obesity.  Cushing's syndrome.  Diabetes.  High cholesterol.  Certain drugs.  Poisons.  Some viral infections.  Pregnancy. What increases the risk? You are more likely to develop this condition if you:  Abuse alcohol.  Are overweight.  Have diabetes.  Have hepatitis.  Have a high triglyceride level.  Are pregnant. What are the signs or symptoms? Fatty liver disease often does not cause symptoms. If symptoms do develop, they can include:  Fatigue.  Weakness.  Weight loss.  Confusion.  Abdominal pain.  Nausea and vomiting.  Yellowing of your skin and the white parts of your eyes (jaundice).  Itchy skin. How is  this diagnosed? This condition may be diagnosed by:  A physical exam and medical history.  Blood tests.  Imaging tests, such as an ultrasound, CT scan, or MRI.  A liver biopsy. A small sample of liver tissue is removed using a needle. The sample is then looked at under a microscope. How is this treated? Fatty liver disease is often caused by other health conditions. Treatment for fatty liver may involve medicines and lifestyle changes to manage conditions such as:  Alcoholism.  High cholesterol.  Diabetes.  Being overweight or obese. Follow these instructions at home:   Do not drink alcohol. If you have trouble quitting, ask your health care provider how to safely quit with the help of medicine or a supervised program. This is important to keep your condition from getting worse.  Eat a healthy diet as told by your health care provider. Ask your health care provider about working with a diet and nutrition specialist (dietitian) to develop an eating plan.  Exercise regularly. This can help you lose weight and control your cholesterol and diabetes. Talk to your health care provider about an exercise plan and which activities are best for you.  Take over-the-counter and prescription medicines only as told by your health care provider.  Keep all follow-up visits as told by your health care provider. This is important. Contact a health care provider if: You have trouble controlling your:  Blood sugar. This is especially important if you have diabetes.  Cholesterol.  Drinking of alcohol. Get help right away if:  You have abdominal pain.  You have  jaundice.  You have nausea and vomiting.  You vomit blood or material that looks like coffee grounds.  You have stools that are black, tar-like, or bloody. Summary  Fatty liver disease develops when too much fat builds up in the cells of your liver.  Fatty liver disease often causes no symptoms or problems. However, over time,  fatty liver can cause inflammation that may lead to more serious liver problems, such as scarring of the liver (cirrhosis).  You are more likely to develop this condition if you abuse alcohol, are pregnant, are overweight, have diabetes, have hepatitis, or have high triglyceride levels.  Contact your health care provider if you have trouble controlling your weight, blood sugar, cholesterol, or drinking of alcohol. This information is not intended to replace advice given to you by your health care provider. Make sure you discuss any questions you have with your health care provider. Document Released: 12/09/2005 Document Revised: 08/02/2017 Document Reviewed: 08/02/2017 Elsevier Interactive Patient Education  2019 [employer]   Nonalcoholic Fatty Liver Disease Diet Nonalcoholic fatty liver disease is a condition that causes fat to accumulate in and around the liver. The disease makes it harder for the liver to work the way that it should. Following a healthy diet can help to keep nonalcoholic fatty liver disease under control. It can also help to prevent or improve conditions that are associated with the disease, such as heart disease, diabetes, high blood pressure, and abnormal cholesterol levels. Along with regular exercise, this diet:  Promotes weight loss.  Helps to control blood sugar levels.  Helps to improve the way that the body uses insulin. What do I need to know about this diet?  Use the glycemic index (GI) to plan your meals. The index tells you how quickly a food will raise your blood sugar. Choose low-GI foods. These foods take a longer time to raise blood sugar.  Keep track of how many calories you take in. Eating the right amount of calories will help you to achieve a healthy weight.  You may want to follow a Mediterranean diet. This diet includes a lot of vegetables, lean meats or fish, whole grains, fruits, and healthy oils and fats. What foods can I eat? Grains Whole  grains, such as whole-wheat or whole-grain breads, crackers, tortillas, cereals, and pasta. Stone-ground whole wheat. Pumpernickel bread. Unsweetened oatmeal. Bulgur. Barley. Quinoa. Brown or wild rice. Corn or whole-wheat flour tortillas. Vegetables Lettuce. Spinach. Peas. Beets. Cauliflower. Cabbage. Broccoli. Carrots. Tomatoes. Squash. Eggplant. Herbs. Peppers. Onions. Cucumbers. Brussels sprouts. Yams and sweet potatoes. Beans. Lentils. Fruits Bananas. Apples. Oranges. Grapes. Papaya. Mango. Pomegranate. Kiwi. Grapefruit. Cherries. Meats and Other Protein Sources Seafood and shellfish. Lean meats. Poultry. Tofu. Dairy Low-fat or fat-free dairy products, such as yogurt, cottage cheese, and cheese. Beverages Water. Sugar-free drinks. Tea. Coffee. Low-fat or skim milk. Milk alternatives, such as soy or almond milk. Real fruit juice. Condiments Mustard. Relish. Low-fat, low-sugar ketchup and barbecue sauce. Low-fat or fat-free mayonnaise. Sweets and Desserts Sugar-free sweets. Fats and Oils Avocado. Canola or olive oil. Nuts and nut butters. Seeds. The items listed above may not be a complete list of recommended foods or beverages. Contact your dietitian for more options. What foods are not recommended? Palm oil and coconut oil. Processed foods. Fried foods. Sweetened drinks, such as sweet tea, milkshakes, snow cones, iced sweet drinks, and sodas. Alcohol. Sweets. Foods that contain a lot of salt or sodium. The items listed above may not be a complete list of foods  and beverages to avoid. Contact your dietitian for more information. This information is not intended to replace advice given to you by your health care provider. Make sure you discuss any questions you have with your health care provider. Document Released: 03/10/2015 Document Revised: 03/31/2016 Document Reviewed: 11/18/2014 Elsevier Interactive Patient Education  2019 [employer].

## 2019-04-30 NOTE — Progress Notes (Signed)
Janet Richards is a 58 y.o. female who presents to [facility] today for follow-up hip pain and discuss abdominal pain and transaminitis.  Patient was seen on June 10 for bilateral trochanteric bursitis.  She had left trochanteric injection.  She notes now she is feeling a lot better but she had quite a bit of pain for a few days after the injection.  Her left hip is now completely asymptomatic and her right hip is feeling a bit better as well.  She is doing some of the home exercises as well.  Additionally she was referred to gastroenterology for transaminitis and persistently elevated pancreatic enzymes.  She is had a work-up including EGD which did show some gastritis and is in the process of being worked up for steatohepatitis with elastography ultrasound.   ROS as above:  Exam:  BP (!) 135/59   Pulse 85   Temp 98.3 F (36.8 C) (Oral)   Wt 193 lb (87.5 kg)   LMP 09/24/2017   BMI 24.12 kg/m  Wt Readings from Last 5 Encounters:  04/30/19 193 lb (87.5 kg)  04/23/19 196 lb (88.9 kg)  04/18/19 196 lb (88.9 kg)  04/17/19 196 lb (88.9 kg)  04/09/19 196 lb (88.9 kg)    Gen: Well NAD HEENT: EOMI,  MMM Lungs: Normal work of breathing. CTABL Heart: RRR no MRG Abd: NABS, Soft. Nondistended, Nontender Exts: Brisk capillary refill, warm and well perfused.  MSK: Normal hip motion.  Normal gait.    Assessment and Plan: 58 y.o. female with  Hip pain bilaterally: Trochanteric bursitis.  Patient had what appears to be a steroid flare after the last injection.  Fortunately she is having significant symptom improvement.  We will proceed with home exercise program.  If not improving next up would likely be physical therapy.  Would consider repeat injection or contralateral injection if needed however patient had a fair amount of pain due to steroid flare would like to  avoid injections if possible.  Additionally patient has steatohepatitis.  Plan for continued work-up with gastroenterology.  Discussed lower carbohydrate eating plan.  This will likely result in weight loss and improve liver enzymes as well.  PDMP not reviewed this encounter. No orders of the defined types were placed in this encounter.  No orders of the defined types were placed in this encounter.    Historical information moved to improve visibility of documentation.  Past Medical History:  Diagnosis Date  . Asthma   . Chronic pancreatitis ([place])   . Essential hypertension, benign   . Menstrual migraine   . Periodontitis   . Prediabetes 10/17/2014  . Primary osteoarthritis of both knees   . Sinusitis   . Urticaria    Past Surgical History:  Procedure Laterality Date  . TUBAL LIGATION  12/26/1987   Social History   Tobacco Use  . Smoking status: Former Smoker    Packs/day: 1.50    Types: Cigarettes    Quit date: 02/05/2006    Years since quitting: 13.2  . Smokeless tobacco: Never Used  Substance Use Topics  . Alcohol use: Yes    Comment: rarely   family history includes Allergic rhinitis in her father and son; Asthma in her son; CAD in her father; Colon cancer in her sister; Hypertension in her mother; Urticaria in her sister.  Medications: Current Outpatient Medications  Medication Sig Dispense Refill  . azelastine (OPTIVAR) 0.05 % ophthalmic solution Place  1 drop into both eyes 2 (two) times daily. (Patient not taking: Reported on 04/23/2019) 6 mL 5  . cetirizine HCl (ZYRTEC) 1 MG/ML solution TAKE 10 MLS (10 MG TOTAL) BY MOUTH DAILY. 944 mL 1  . chlorhexidine gluconate, MEDLINE KIT, (PERIDEX) 0.12 % solution Use as directed 15 mLs in the mouth or throat 2 (two) times daily. (Patient not taking: Reported on 04/23/2019) 120 mL 1  . conjugated estrogens (PREMARIN) vaginal cream Place 1 Applicatorful vaginally daily. For first 2 weeks then decrease to 3 times a week. 42.5 g  12  . cyclobenzaprine (FLEXERIL) 10 MG tablet Take 1 tablet (10 mg total) by mouth 3 (three) times daily as needed for muscle spasms. 30 tablet 0  . hydrochlorothiazide (HYDRODIURIL) 25 MG tablet TAKE 1 TABLET BY MOUTH EVERY DAY 90 tablet 1  . ipratropium (ATROVENT) 0.06 % nasal spray Place 2 sprays into both nostrils 4 (four) times daily.    . Ipratropium-Albuterol (COMBIVENT) 20-100 MCG/ACT AERS respimat Inhale 1 puff into the lungs every 6 (six) hours as needed for wheezing or shortness of breath. 1 Inhaler 1  . IRON, FERROUS SULFATE, PO Take by mouth.    [name] L-Methylfolate-B6-B12 (FOLTANX) 3-35-2 MG TABS Take 1 tablet by mouth 2 (two) times daily. (Patient not taking: Reported on 04/23/2019) 60 tablet 11  . megestrol (MEGACE) 40 MG tablet Take 1 tablet (40 mg total) by mouth 2 (two) times daily. When bleeding slows, take 1 per day 90 tablet 0  . mirabegron ER (MYRBETRIQ) 25 MG TB24 tablet Take 1 tablet (25 mg total) by mouth daily. 30 tablet 3  . montelukast (SINGULAIR) 10 MG tablet Take 1 tablet once a day for coughing or wheezing. 90 tablet 0  . Multiple Vitamins-Minerals (MULTIVITAMIN ADULT PO) Take by mouth.    . Olopatadine HCl 0.2 % SOLN Place 1 drop into both eyes daily. 3 mL 5  . pantoprazole (PROTONIX) 40 MG tablet Take 1 tablet (40 mg total) by mouth daily. 60 tablet 3  . Probiotic Product (PROBIOTIC-10) CAPS Take by mouth.    . triamcinolone (KENALOG) 0.1 % paste Use as directed 1 application in the mouth or throat 3 (three) times daily. 5 g 1  . triamcinolone (NASACORT) 55 MCG/ACT AERO nasal inhaler Place 2 sprays into the nose daily. 1 Inhaler 6   Current Facility-Administered Medications  Medication Dose Route Frequency Provider Last Rate Last Dose  . 0.9 %  sodium chloride infusion  500 mL Intravenous Once Cirigliano, Vito V, DO       Allergies  Allergen Reactions  . Azithromycin Nausea And Vomiting  . Clindamycin/Lincomycin Hives    Facial swelling  . Penicillins Swelling  and Other (See Comments)    Patient states does not work for her.  She denies swelling or hives from penicillin. She states penicillin not effective for infections. Has taken cephalosporins without problems  . Sulfa Antibiotics Hives     Discussed warning signs or symptoms. Please see discharge instructions. Patient expresses understanding.

## 2019-05-01 ENCOUNTER — Ambulatory Visit: Payer: Medicare Other | Admitting: Family Medicine

## 2019-05-09 ENCOUNTER — Telehealth: Payer: Self-pay | Admitting: Physician Assistant

## 2019-05-09 NOTE — Telephone Encounter (Signed)
Patient states that she doesn't need appointment anymore, feeling better.

## 2019-05-09 NOTE — Telephone Encounter (Signed)
Schedule patient for virtual visit

## 2019-05-09 NOTE — Telephone Encounter (Signed)
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  about 7 times and has been going to the bathroom 10-15 times a day with diahrrea. The vomiting has stopped but the diarrhea has not. She wanted to know if Dr.Corey can prescribe something to stop the diarrhea and wanted to know if she needed to come in. Please contact and advise.

## 2019-05-15 ENCOUNTER — Encounter: Payer: Self-pay | Admitting: Family Medicine

## 2019-05-15 ENCOUNTER — Telehealth (INDEPENDENT_AMBULATORY_CARE_PROVIDER_SITE_OTHER): Payer: Medicare Other | Admitting: Family Medicine

## 2019-05-15 ENCOUNTER — Telehealth: Payer: Self-pay

## 2019-05-15 DIAGNOSIS — I10 Essential (primary) hypertension: Secondary | ICD-10-CM | POA: Diagnosis not present

## 2019-05-15 DIAGNOSIS — R7303 Prediabetes: Secondary | ICD-10-CM

## 2019-05-15 DIAGNOSIS — Z7689 Persons encountering health services in other specified circumstances: Secondary | ICD-10-CM

## 2019-05-15 DIAGNOSIS — J4520 Mild intermittent asthma, uncomplicated: Secondary | ICD-10-CM | POA: Diagnosis not present

## 2019-05-15 DIAGNOSIS — M170 Bilateral primary osteoarthritis of knee: Secondary | ICD-10-CM

## 2019-05-15 MED ORDER — MONTELUKAST SODIUM 10 MG PO TABS: ORAL_TABLET | ORAL | 3 refills | Status: DC

## 2019-05-15 MED ORDER — IPRATROPIUM BROMIDE 0.06 % NA SOLN: 2.0000 | Freq: Four times a day (QID) | NASAL | 3 refills | Status: DC

## 2019-05-15 MED ORDER — HYDROCHLOROTHIAZIDE 25 MG PO TABS: 25.0000 mg | ORAL_TABLET | Freq: Every day | ORAL | 3 refills | Status: DC

## 2019-05-15 MED ORDER — CETIRIZINE HCL 1 MG/ML PO SOLN: ORAL | 1 refills | Status: DC

## 2019-05-15 MED ORDER — TRIAMCINOLONE ACETONIDE 55 MCG/ACT NA AERO: 2.0000 | INHALATION_SPRAY | Freq: Every day | NASAL | 6 refills | Status: DC

## 2019-05-15 NOTE — Telephone Encounter (Signed)
Can we get pt scheduled for a CPE? Thanks!

## 2019-05-15 NOTE — Telephone Encounter (Signed)
I called and scheduled CPE with patient.

## 2019-05-15 NOTE — Progress Notes (Signed)
Virtual Visit via Video Note  I connected with Janet Richards on 05/15/19 at 10:00 AM EDT by a video enabled telemedicine application and verified that I am speaking with the correct person using two identifiers. Location patient: home Location provider: home office Persons participating in the virtual visit: patient, provider  I discussed the limitations of evaluation and management by telemedicine and the availability of in person appointments. The patient expressed understanding and agreed to proceed.  Interactive audio and video telecommunications were attempted between myself and the patient, however failed, due to the patient having technical difficulties. We continued and completed the visit with audio only.   Chief Complaint  Patient presents with  . Establish Care    HPI: Janet Richards is a 58 y.o. female to establish care with our office. She is married and has 3 children (1 son with mild intellectual disability and ASD) and grandson Aron Baba (12yo) whom pt has custody of due to his father being incarcerated.  Her previous PCP was [facility] Planas, [place] [facility]. Her PMHx is significant for HTN, prediabetes, asthma, h/o chronic pancreatitis, OA B/L knee, urticaria, peripheral neuropathy.  Specialists: Sports Med - Dr. Lynne Leader, GI - Dr. Gerrit Heck, GYN - Dr. Hulan Fray Due for CPE, labs (lipids, A1C) Last mammo - 08/2018 Last PAP - 10/2016 - normal PAP, HPV negative (due in 10/2019) Last colonoscopy -   She states her medical issues/conditions are stable. She needs a refill of singulair and HCTZ as well as liquid zyrtec, and both nasal sprays. BP 122/80 yesterday AM  Past Medical History:  Diagnosis Date  . Asthma   . Chronic pancreatitis ([place])   . Essential hypertension, benign   . Menstrual migraine   . Periodontitis   . Prediabetes 10/17/2014  . Primary osteoarthritis of both knees   . Sinusitis   . Urticaria     Past Surgical History:   Procedure Laterality Date  . TUBAL LIGATION  12/26/1987    Family History  Problem Relation Age of Onset  . Hypertension Mother   . CAD Father        MI in his 63s  . Allergic rhinitis Father   . Urticaria Sister   . Colon cancer Sister   . Allergic rhinitis Son   . Asthma Son   . Angioedema Neg Hx   . Eczema Neg Hx   . Immunodeficiency Neg Hx   . Esophageal cancer Neg Hx   . Stomach cancer Neg Hx   . Rectal cancer Neg Hx     Social History   Tobacco Use  . Smoking status: Former Smoker    Packs/day: 1.50    Types: Cigarettes    Quit date: 02/05/2006    Years since quitting: 13.2  . Smokeless tobacco: Never Used  Substance Use Topics  . Alcohol use: Yes    Comment: rarely  . Drug use: No     Current Outpatient Medications:  .  cetirizine HCl (ZYRTEC) 1 MG/ML solution, TAKE 10 MLS (10 MG TOTAL) BY MOUTH DAILY., Disp: 944 mL, Rfl: 1 .  conjugated estrogens (PREMARIN) vaginal cream, Place 1 Applicatorful vaginally daily. For first 2 weeks then decrease to 3 times a week., Disp: 42.5 g, Rfl: 12 .  cyclobenzaprine (FLEXERIL) 10 MG tablet, Take 1 tablet (10 mg total) by mouth 3 (three) times daily as needed for muscle spasms., Disp: 30 tablet, Rfl: 0 .  hydrochlorothiazide (HYDRODIURIL) 25 MG tablet, Take 1 tablet (25 mg total) by  mouth daily., Disp: 90 tablet, Rfl: 3 .  ipratropium (ATROVENT) 0.06 % nasal spray, Place 2 sprays into both nostrils 4 (four) times daily., Disp: 15 mL, Rfl: 3 .  IRON, FERROUS SULFATE, PO, Take by mouth., Disp: , Rfl:  .  megestrol (MEGACE) 40 MG tablet, Take 1 tablet (40 mg total) by mouth 2 (two) times daily. When bleeding slows, take 1 per day, Disp: 90 tablet, Rfl: 0 .  montelukast (SINGULAIR) 10 MG tablet, Take 1 tablet once a day for coughing or wheezing., Disp: 90 tablet, Rfl: 3 .  Multiple Vitamins-Minerals (MULTIVITAMIN ADULT PO), Take by mouth., Disp: , Rfl:  .  Olopatadine HCl 0.2 % SOLN, Place 1 drop into both eyes daily., Disp: 3 mL,  Rfl: 5 .  pantoprazole (PROTONIX) 40 MG tablet, Take 1 tablet (40 mg total) by mouth daily., Disp: 60 tablet, Rfl: 3 .  Probiotic Product (PROBIOTIC-10) CAPS, Take by mouth., Disp: , Rfl:  .  triamcinolone (KENALOG) 0.1 % paste, Use as directed 1 application in the mouth or throat 3 (three) times daily., Disp: 5 g, Rfl: 1 .  triamcinolone (NASACORT) 55 MCG/ACT AERO nasal inhaler, Place 2 sprays into the nose daily., Disp: 1 Inhaler, Rfl: 6  Current Facility-Administered Medications:  .  0.9 %  sodium chloride infusion, 500 mL, Intravenous, Once, Cirigliano, Vito V, DO  Allergies  Allergen Reactions  . Azithromycin Nausea And Vomiting  . Clindamycin/Lincomycin Hives    Facial swelling  . Penicillins Swelling and Other (See Comments)    Patient states does not work for her.  She denies swelling or hives from penicillin. She states penicillin not effective for infections. Has taken cephalosporins without problems  . Sulfa Antibiotics Hives      ROS: See pertinent positives and negatives per HPI.   EXAM:  VITALS per patient if applicable: LMP 16/08/9603   GENERAL: alert, oriented, appears well and in no acute distress  LUNGS: no obvious gross SOB, gasping or wheezing, no conversational dyspnea  PSYCH/NEURO: pleasant and cooperative, speech and thought processing grossly intact   ASSESSMENT AND PLAN:  1. Encounter to establish care with new doctor - due for CPE, labs - PAP UTD, mammo UTD and due in fall 2020  2. Prediabetes - due for A1C  3. Essential hypertension - well-controlled, at goal - cont HCTZ  4. Primary osteoarthritis of both knees - cont regular f/u with sport med Dr. Georgina Snell  5. Mild intermittent asthma without complication - stable, well-controlled - cont current med  Pt will schedule CPE appt    I discussed the assessment and treatment plan with the patient. The patient was provided an opportunity to ask questions and all were answered. The patient  agreed with the plan and demonstrated an understanding of the instructions.   The patient was advised to call back or seek an in-person evaluation if the symptoms worsen or if the condition fails to improve as anticipated.   Letta Median, DO

## 2019-05-21 ENCOUNTER — Ambulatory Visit: Payer: Medicare Other | Admitting: Sports Medicine

## 2019-07-08 ENCOUNTER — Other Ambulatory Visit: Payer: Self-pay

## 2019-07-08 ENCOUNTER — Emergency Department (INDEPENDENT_AMBULATORY_CARE_PROVIDER_SITE_OTHER): Payer: Medicare Other

## 2019-07-08 ENCOUNTER — Encounter: Payer: Self-pay | Admitting: Emergency Medicine

## 2019-07-08 ENCOUNTER — Emergency Department (INDEPENDENT_AMBULATORY_CARE_PROVIDER_SITE_OTHER)
Admission: EM | Admit: 2019-07-08 | Discharge: 2019-07-08 | Disposition: A | Discharge status: Home / Self Care | Payer: Medicare Other | Source: Home / Self Care

## 2019-07-08 DIAGNOSIS — R0602 Shortness of breath: Secondary | ICD-10-CM | POA: Diagnosis not present

## 2019-07-08 DIAGNOSIS — M940 Chondrocostal junction syndrome [Tietze]: Secondary | ICD-10-CM

## 2019-07-08 DIAGNOSIS — R079 Chest pain, unspecified: Secondary | ICD-10-CM

## 2019-07-08 NOTE — ED Provider Notes (Signed)
[name]    CSN: [id number] Arrival date & time: 07/08/19  1045      History   Chief Complaint Chief Complaint  Patient presents with  . Chest Pain    HPI Janet Richards is a 58 y.o. female.   HPI Janet Richards is a 58 y.o. female presenting to UC with c/o intermittent Right side chest pain that occasionally radiates into her Right arm since having flu-like symptoms in February of this year. Pt concerned she had Covid-19 before it was able to be tested for in the [place]  She has had intermittent mild SOB since then as well.  She went to the emergency department on  11/23/2018 for possible insect bite to Right shoulder which caused pain to radiate up and down neck and Right breast but she eloped after triage.  She was then evaluated by her PCP on 01/22/2019 for sternal tenderness and possible mass in her chest.  Normal CXR at that time. She was seen at [facility] on 02/21/2019, dx with costochondritis at that time.  Today, she reports she still has Right side chest pain and does not believe it is her heart due to having multiple normal EKGs and no prior hx of heart problems. She also reports intermittent "coolness" sensation in her breasts. She recently had a normal mammogram. She is requesting a CXR today "just to make sure everything is okay."  Denies fever, chills, cough, congestion or known injury.  She is scheduled to f/u with a new PCP in person later in September but did not want to wait until then for another CXR.   Past Medical History:  Diagnosis Date  . Asthma   . Chronic pancreatitis ([place])   . Essential hypertension, benign   . Menstrual migraine   . Periodontitis   . Prediabetes 10/17/2014  . Primary osteoarthritis of both knees   . Sinusitis   . Urticaria     Patient Active Problem List   Diagnosis Date Noted  . Chronic pancreatitis ([place]) 04/10/2019  . Irritable bowel syndrome with diarrhea 09/13/2018  . Environmental allergies 09/12/2018  . Mild  intermittent asthma without complication 0000000  . Dyspepsia 07/31/2018  . Sensory neuronopathy 07/29/2018  . Post-menopausal bleeding 05/21/2018  . Large breasts 04/15/2018  . Caregiver stress 03/16/2018  . DDD (degenerative disc disease), cervical 02/22/2018  . Mixed incontinence urge and stress 01/25/2018  . Periodontitis 12/15/2017  . Vaginal dryness, menopausal 03/01/2017  . Abnormal ankle brachial index (ABI) 11/09/2016  . Numbness and tingling of both feet 09/02/2016  . Ganglion cyst of right foot 07/12/2016  . Anemia, iron deficiency 02/25/2016  . Right ankle pain 02/24/2016  . Fatigue 02/24/2016  . History of colonic polyps 03/20/2015  . Hyperlipidemia 02/20/2015  . Other allergic rhinitis 01/14/2015  . Prediabetes 10/17/2014  . Osteoarthritis of first metatarsophalangeal joint 04/11/2014  . 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 No blood products 03/28/2013  . Essential hypertension 03/01/2013  . Abnormal uterine bleeding 02/05/2013  . Primary osteoarthritis of both knees 12/27/2012    Past Surgical History:  Procedure Laterality Date  . TUBAL LIGATION  12/26/1987    OB History    Gravida  5   Para  3   Term  3   Preterm      AB  2   Living  3     SAB      TAB  2   Ectopic      Multiple  Live Births               Home Medications    Prior to Admission medications   Medication Sig Start Date End Date Taking? Authorizing Provider  cetirizine HCl (ZYRTEC) 1 MG/ML solution TAKE 10 MLS (10 MG TOTAL) BY MOUTH DAILY. 05/15/19   Cirigliano, Garvin Fila, DO  conjugated estrogens (PREMARIN) vaginal cream Place 1 Applicatorful vaginally daily. For first 2 weeks then decrease to 3 times a week. 09/12/18   Breeback, Royetta Car, PA-C  cyclobenzaprine (FLEXERIL) 10 MG tablet Take 1 tablet (10 mg total) by mouth 3 (three) times daily as needed for muscle spasms. 07/27/18   Breeback, Jade L, PA-C  hydrochlorothiazide (HYDRODIURIL) 25 MG tablet Take 1 tablet (25  mg total) by mouth daily. 05/15/19   Cirigliano, Mary K, DO  ipratropium (ATROVENT) 0.06 % nasal spray Place 2 sprays into both nostrils 4 (four) times daily. 05/15/19   Cirigliano, Mary K, DO  IRON, FERROUS SULFATE, PO Take by mouth.    [provider]  megestrol (MEGACE) 40 MG tablet Take 1 tablet (40 mg total) by mouth 2 (two) times daily. When bleeding slows, take 1 per day 12/31/18   Breeback, Jade L, PA-C  montelukast (SINGULAIR) 10 MG tablet Take 1 tablet once a day for coughing or wheezing. 05/15/19   Cirigliano, Garvin Fila, DO  Multiple Vitamins-Minerals (MULTIVITAMIN ADULT PO) Take by mouth.    [provider]  Olopatadine HCl 0.2 % SOLN Place 1 drop into both eyes daily. 03/22/19   Breeback, Jade L, PA-C  pantoprazole (PROTONIX) 40 MG tablet Take 1 tablet (40 mg total) by mouth daily. 04/09/19   Silverio Decamp, MD  Probiotic Product (PROBIOTIC-10) CAPS Take by mouth.    [provider]  triamcinolone (KENALOG) 0.1 % paste Use as directed 1 application in the mouth or throat 3 (three) times daily. 02/25/19   Breeback, Jade L, PA-C  triamcinolone (NASACORT) 55 MCG/ACT AERO nasal inhaler Place 2 sprays into the nose daily. 05/15/19   CiriglianoGarvin Fila, DO    Family History Family History  Problem Relation Age of Onset  . Hypertension Mother   . CAD Father        MI in his 3s  . Allergic rhinitis Father   . Urticaria Sister   . Colon cancer Sister   . Allergic rhinitis Son   . Asthma Son   . Angioedema Neg Hx   . Eczema Neg Hx   . Immunodeficiency Neg Hx   . Esophageal cancer Neg Hx   . Stomach cancer Neg Hx   . Rectal cancer Neg Hx     Social History Social History   Tobacco Use  . Smoking status: Former Smoker    Packs/day: 1.50    Types: Cigarettes    Quit date: 02/05/2006    Years since quitting: 13.4  . Smokeless tobacco: Never Used  Substance Use Topics  . Alcohol use: Yes    Comment: rarely  . Drug use: No     Allergies   Azithromycin,  Clindamycin/lincomycin, Penicillins, and Sulfa antibiotics   Review of Systems Review of Systems  Constitutional: Positive for fatigue (mild). Negative for chills, diaphoresis and fever.  HENT: Negative for congestion, ear pain, sore throat, trouble swallowing and voice change.   Respiratory: Positive for shortness of breath. Negative for cough.   Cardiovascular: Positive for chest pain. Negative for palpitations.  Gastrointestinal: Negative for abdominal pain, diarrhea, nausea and vomiting.  Musculoskeletal:  Positive for arthralgias (Right arm). Negative for back pain and myalgias.  Skin: Negative for rash.     Physical Exam Triage Vital Signs ED Triage Vitals  Enc Vitals Group     BP 07/08/19 1118 134/75     Pulse Rate 07/08/19 1118 84     Resp --      Temp 07/08/19 1118 98.6 F (37 C)     Temp Source 07/08/19 1118 Oral     SpO2 07/08/19 1118 98 %     Weight --      Height --      Head Circumference --      Peak Flow --      Pain Score 07/08/19 1119 2     Pain Loc --      Pain Edu? --      Excl. in [place]? --    No data found.  Updated Vital Signs BP 134/75 (BP Location: Right Arm)   Pulse 84   Temp 98.6 F (37 C) (Oral)   LMP 09/24/2017   SpO2 98%   Visual Acuity Right Eye Distance:   Left Eye Distance:   Bilateral Distance:    Right Eye Near:   Left Eye Near:    Bilateral Near:     Physical Exam Vitals signs and nursing note reviewed.  Constitutional:      Appearance: She is well-developed.  HENT:     Head: Normocephalic and atraumatic.  Neck:     Musculoskeletal: Normal range of motion.  Cardiovascular:     Rate and Rhythm: Normal rate and regular rhythm.  Pulmonary:     Effort: Pulmonary effort is normal.     Breath sounds: No decreased breath sounds, wheezing, rhonchi or rales.  Chest:     Chest wall: Tenderness present.    Musculoskeletal: Normal range of motion.  Skin:    General: Skin is warm and dry.  Neurological:     Mental Status:  She is alert and oriented to person, place, and time.  Psychiatric:        Behavior: Behavior normal.      UC Treatments / Results  Labs (all labs ordered are listed, but only abnormal results are displayed) Labs Reviewed - No data to display  EKG   Radiology Dg Chest 2 View  Result Date: 07/08/2019 CLINICAL DATA:  Pt with RT sided chest pain and SOB since February. Ex-smoker EXAM: CHEST - 2 VIEW COMPARISON:  Chest radiograph 01/22/2019 FINDINGS: Stable cardiomediastinal contours are within normal limits. The lungs are clear. No pneumothorax or pleural effusion. Unremarkable visualized skeleton and upper abdomen. IMPRESSION: No active cardiopulmonary disease. Electronically Signed   By: Audie Pinto M.D.   On: 07/08/2019 11:57    Procedures Procedures (including critical care time)  Medications Ordered in UC Medications - No data to display  Initial Impression / Assessment and Plan / UC Course  I have reviewed the triage vital signs and the nursing notes.  Pertinent labs & imaging results that were available during my care of the patient were reviewed by me and considered in my medical decision making (see chart for details).    Reassured pt of normal CXR Pt adamant pain is not from her heart, declined EKG.  Reviewed PMH, pt does have prior hx of costochondritis in April 2020 as well as dx on 12/31/2016.   Encouraged f/u with PCP as previously scheduled. Discussed symptoms that warrant emergent care in the ED. AVS provided.  Final Clinical Impressions(s) /  UC Diagnoses   Final diagnoses:  Costochondritis  Mild shortness of breath     Discharge Instructions      You may take 500mg  acetaminophen every 4-6 hours or in combination with ibuprofen 400-600mg  every 6-8 hours as needed for pain and inflammation.  Be sure to drink at least eight 8oz glasses of water to stay well hydrated and get at least 8 hours of sleep at night, preferably more while sick.   Please  follow up with your family doctor next month as previously scheduled for ongoing healthcare needs and any additional referrals they may recommend.  Call 911 or go to the hospital if symptoms worsening or new concerning symptoms develop including but not limited to- worsening pain, difficulty breathing, vomiting, severe abdominal pain, dizziness/passing out     ED Prescriptions    None     Controlled Substance Prescriptions [place] Controlled Substance Registry consulted? Not Applicable   Tyrell Antonio 07/08/19 1656

## 2019-07-08 NOTE — Discharge Instructions (Signed)
°  You may take 500mg  acetaminophen every 4-6 hours or in combination with ibuprofen 400-600mg  every 6-8 hours as needed for pain and inflammation.  Be sure to drink at least eight 8oz glasses of water to stay well hydrated and get at least 8 hours of sleep at night, preferably more while sick.   Please follow up with your family doctor next month as previously scheduled for ongoing healthcare needs and any additional referrals they may recommend.  Call 911 or go to the hospital if symptoms worsening or new concerning symptoms develop including but not limited to- worsening pain, difficulty breathing, vomiting, severe abdominal pain, dizziness/passing out

## 2019-07-08 NOTE — ED Triage Notes (Signed)
Pt c/o chest pain that radiates through her breast and ribs for the last few months. States pain is intermittent and she has SOB with this at times.

## 2019-07-29 ENCOUNTER — Other Ambulatory Visit: Payer: Self-pay | Admitting: Family Medicine

## 2019-07-29 DIAGNOSIS — Z1231 Encounter for screening mammogram for malignant neoplasm of breast: Secondary | ICD-10-CM

## 2019-08-01 ENCOUNTER — Other Ambulatory Visit: Payer: Self-pay

## 2019-08-01 ENCOUNTER — Encounter: Payer: Self-pay | Admitting: Family Medicine

## 2019-08-01 ENCOUNTER — Ambulatory Visit (INDEPENDENT_AMBULATORY_CARE_PROVIDER_SITE_OTHER): Payer: Medicare Other | Admitting: Family Medicine

## 2019-08-01 VITALS — BP 160/100 | HR 86 | Temp 98.3°F | Ht 75.0 in | Wt 189.0 lb

## 2019-08-01 DIAGNOSIS — R748 Abnormal levels of other serum enzymes: Secondary | ICD-10-CM

## 2019-08-01 DIAGNOSIS — R7303 Prediabetes: Secondary | ICD-10-CM | POA: Diagnosis not present

## 2019-08-01 DIAGNOSIS — R1012 Left upper quadrant pain: Secondary | ICD-10-CM | POA: Diagnosis not present

## 2019-08-01 DIAGNOSIS — J0100 Acute maxillary sinusitis, unspecified: Secondary | ICD-10-CM

## 2019-08-01 DIAGNOSIS — R7989 Other specified abnormal findings of blood chemistry: Secondary | ICD-10-CM

## 2019-08-01 DIAGNOSIS — H60501 Unspecified acute noninfective otitis externa, right ear: Secondary | ICD-10-CM

## 2019-08-01 DIAGNOSIS — J302 Other seasonal allergic rhinitis: Secondary | ICD-10-CM | POA: Diagnosis not present

## 2019-08-01 DIAGNOSIS — K760 Fatty (change of) liver, not elsewhere classified: Secondary | ICD-10-CM

## 2019-08-01 DIAGNOSIS — K219 Gastro-esophageal reflux disease without esophagitis: Secondary | ICD-10-CM

## 2019-08-01 DIAGNOSIS — R740 Nonspecific elevation of levels of transaminase and lactic acid dehydrogenase [LDH]: Secondary | ICD-10-CM | POA: Diagnosis not present

## 2019-08-01 DIAGNOSIS — R945 Abnormal results of liver function studies: Secondary | ICD-10-CM

## 2019-08-01 DIAGNOSIS — I10 Essential (primary) hypertension: Secondary | ICD-10-CM | POA: Diagnosis not present

## 2019-08-01 DIAGNOSIS — Z0000 Encounter for general adult medical examination without abnormal findings: Secondary | ICD-10-CM

## 2019-08-01 DIAGNOSIS — R7401 Elevation of levels of liver transaminase levels: Secondary | ICD-10-CM

## 2019-08-01 DIAGNOSIS — Z2821 Immunization not carried out because of patient refusal: Secondary | ICD-10-CM

## 2019-08-01 LAB — HEPATIC FUNCTION PANEL
ALT: 24 U/L (ref 0–35)
AST: 20 U/L (ref 0–37)
Albumin: 4.4 g/dL (ref 3.5–5.2)
Alkaline Phosphatase: 68 U/L (ref 39–117)
Bilirubin, Direct: 0.1 mg/dL (ref 0.0–0.3)
Total Bilirubin: 0.4 mg/dL (ref 0.2–1.2)
Total Protein: 6.9 g/dL (ref 6.0–8.3)

## 2019-08-01 LAB — LIPID PANEL
Cholesterol: 218 mg/dL — ABNORMAL HIGH (ref 0–200)
HDL: 47.4 mg/dL (ref >39.00)
LDL Cholesterol: 133 mg/dL — ABNORMAL HIGH (ref 0–99)
NonHDL: 170.25
Total CHOL/HDL Ratio: 5
Triglycerides: 185 mg/dL — ABNORMAL HIGH (ref 0.0–149.0)
VLDL: 37 mg/dL (ref 0.0–40.0)

## 2019-08-01 LAB — HEMOGLOBIN A1C: Hgb A1c MFr Bld: 6.2 % (ref 4.6–6.5)

## 2019-08-01 LAB — LIPASE: Lipase: 18 U/L (ref 11.0–59.0)

## 2019-08-01 LAB — VITAMIN D 25 HYDROXY (VIT D DEFICIENCY, FRACTURES): VITD: 26.19 ng/mL — ABNORMAL LOW (ref 30.00–100.00)

## 2019-08-01 MED ORDER — OFLOXACIN 0.3 % OT SOLN: 5.0000 [drp] | Freq: Every day | OTIC | 0 refills | Status: AC

## 2019-08-01 MED ORDER — TRIAMCINOLONE ACETONIDE 55 MCG/ACT NA AERO: 2.0000 | INHALATION_SPRAY | Freq: Every day | NASAL | 6 refills | Status: DC

## 2019-08-01 MED ORDER — METHYLPREDNISOLONE 4 MG PO TBPK: ORAL_TABLET | ORAL | 0 refills | Status: DC

## 2019-08-01 NOTE — Addendum Note (Signed)
Addended by: Lynnea Ferrier on: 08/01/2019 11:17 AM   Modules accepted: Orders

## 2019-08-01 NOTE — Progress Notes (Signed)
Janet Richards is a 58 y.o. female  Chief Complaint  Patient presents with  . Annual Exam    CPE-not fasting / sinus pressure/ headache/ both ears have pain     HPI: Janet Richards is a 58 y.o. female here for CPE.  She is not fasting for labs but would like to do labs today.  She complains of 2-3 wks of sinus pressure, nasal congestion, intermittent headache. B/L ear fullness, Rt ear pain and tenderness. She has been using atrovent nasal spray but has been out of nasacort which she normally uses. No fever, chills. Some lightheadedness with position change.   Sports Med - Dr. Lynne Leader, GI - Dr. Gerrit Heck, GYN - Dr. Hulan Fray  Last PAP: 10/2016 - due in 10/2019 Last mammo: 08/2018 Last colonoscopy: 2014 - due in 2024  Diet/Exercise: nothing consistent  Past Medical History:  Diagnosis Date  . Asthma   . Chronic pancreatitis ([place])   . Essential hypertension, benign   . Menstrual migraine   . Periodontitis   . Prediabetes 10/17/2014  . Primary osteoarthritis of both knees   . Sinusitis   . Urticaria     Past Surgical History:  Procedure Laterality Date  . TUBAL LIGATION  12/26/1987    Social History   Socioeconomic History  . Marital status: Married    Spouse name: Not on file  . Number of children: 3  . Years of education: Not on file  . Highest education level: Not on file  Occupational History  . Occupation: homemaker  Social Needs  . Financial resource strain: Not on file  . Food insecurity    Worry: Not on file    Inability: Not on file  . Transportation needs    Medical: Not on file    Non-medical: Not on file  Tobacco Use  . Smoking status: Former Smoker    Packs/day: 1.50    Types: Cigarettes    Quit date: 02/05/2006    Years since quitting: 13.4  . Smokeless tobacco: Never Used  Substance and Sexual Activity  . Alcohol use: Yes    Comment: rarely  . Drug use: No  . Sexual activity: Yes    Partners: Male  Lifestyle  . Physical  activity    Days per week: Not on file    Minutes per session: Not on file  . Stress: Not on file  Relationships  . Social Herbalist on phone: Not on file    Gets together: Not on file    Attends religious service: Not on file    Active member of club or organization: Not on file    Attends meetings of clubs or organizations: Not on file    Relationship status: Not on file  . Intimate partner violence    Fear of current or ex partner: Not on file    Emotionally abused: Not on file    Physically abused: Not on file    Forced sexual activity: Not on file  Other Topics Concern  . Not on file  Social History Narrative  . Not on file    Family History  Problem Relation Age of Onset  . Hypertension Mother   . CAD Father        MI in his 23s  . Allergic rhinitis Father   . Urticaria Sister   . Colon cancer Sister   . Allergic rhinitis Son   . Asthma Son   . Angioedema Neg  Hx   . Eczema Neg Hx   . Immunodeficiency Neg Hx   . Esophageal cancer Neg Hx   . Stomach cancer Neg Hx   . Rectal cancer Neg Hx      Immunization History  Administered Date(s) Administered  . Influenza,inj,Quad PF,6+ Mos 07/12/2016, 07/11/2017, 07/13/2018  . Tdap 11/07/2010, 10/17/2014    Outpatient Encounter Medications as of 08/01/2019  Medication Sig  . cetirizine HCl (ZYRTEC) 1 MG/ML solution TAKE 10 MLS (10 MG TOTAL) BY MOUTH DAILY.  [name] conjugated estrogens (PREMARIN) vaginal cream Place 1 Applicatorful vaginally daily. For first 2 weeks then decrease to 3 times a week.  . cyclobenzaprine (FLEXERIL) 10 MG tablet Take 1 tablet (10 mg total) by mouth 3 (three) times daily as needed for muscle spasms.  . hydrochlorothiazide (HYDRODIURIL) 25 MG tablet Take 1 tablet (25 mg total) by mouth daily.  [name] ipratropium (ATROVENT) 0.06 % nasal spray Place 2 sprays into both nostrils 4 (four) times daily.  . montelukast (SINGULAIR) 10 MG tablet Take 1 tablet once a day for coughing or wheezing.  .  Multiple Vitamins-Minerals (MULTIVITAMIN ADULT PO) Take by mouth.  . Olopatadine HCl 0.2 % SOLN Place 1 drop into both eyes daily.  . pantoprazole (PROTONIX) 40 MG tablet Take 1 tablet (40 mg total) by mouth daily.  [name] triamcinolone (NASACORT) 55 MCG/ACT AERO nasal inhaler Place 2 sprays into the nose daily.  . [DISCONTINUED] triamcinolone (NASACORT) 55 MCG/ACT AERO nasal inhaler Place 2 sprays into the nose daily.  . methylPREDNISolone (MEDROL DOSEPAK) 4 MG TBPK tablet Take as directed  . ofloxacin (FLOXIN OTIC) 0.3 % OTIC solution Place 5 drops into the right ear daily for 10 days.  . [DISCONTINUED] IRON, FERROUS SULFATE, PO Take by mouth.  . [DISCONTINUED] megestrol (MEGACE) 40 MG tablet Take 1 tablet (40 mg total) by mouth 2 (two) times daily. When bleeding slows, take 1 per day  . [DISCONTINUED] Probiotic Product (PROBIOTIC-10) CAPS Take by mouth.  . [DISCONTINUED] triamcinolone (KENALOG) 0.1 % paste Use as directed 1 application in the mouth or throat 3 (three) times daily.   Facility-Administered Encounter Medications as of 08/01/2019  Medication  . 0.9 %  sodium chloride infusion     ROS: Gen: no fever, chills  Skin: no rash, itching ENT: as above in HPI Eyes: no blurry vision, double vision Resp: no cough, wheeze,SOB Breast: no breast tenderness, no nipple discharge, no breast masses CV: no CP, palpitations, LE edema,  GI: no heartburn, n/v/d/c, abd pain GU: no dysuria, urgency, frequency, hematuria; no vaginal itching, odor, discharge MSK: no joint pain, myalgias, back pain Neuro: no dizziness, headache, weakness, vertigo Psych: no depression, anxiety, insomnia   Allergies  Allergen Reactions  . Azithromycin Nausea And Vomiting  . Clindamycin/Lincomycin Hives    Facial swelling  . Penicillins Swelling and Other (See Comments)    Patient states does not work for her.  She denies swelling or hives from penicillin. She states penicillin not effective for infections. Has  taken cephalosporins without problems  . Sulfa Antibiotics Hives    BP (!) 160/100   Pulse 86   Temp 98.3 F (36.8 C) (Oral)   Ht 6\' 3"  (1.905 m)   Wt 189 lb (85.7 kg)   LMP 09/24/2017   SpO2 98%   BMI 23.62 kg/m   BP Readings from Last 3 Encounters:  08/01/19 (!) 160/100  07/08/19 134/75  04/30/19 (!) 135/59     Physical Exam  Constitutional: She is oriented to  person, place, and time. She appears well-developed and well-nourished. No distress.  HENT:  Head: Normocephalic and atraumatic.  Right Ear: Tympanic membrane normal.  Left Ear: Tympanic membrane and ear canal normal.  Nose: Right sinus exhibits maxillary sinus tenderness. Right sinus exhibits no frontal sinus tenderness. Left sinus exhibits maxillary sinus tenderness. Left sinus exhibits no frontal sinus tenderness.  Mouth/Throat: Oropharynx is clear and moist and mucous membranes are normal.  Rt ear canal with erythema and swelling; TTP over tragus  Eyes: Pupils are equal, round, and reactive to light. Conjunctivae are normal.  Neck: Neck supple. No thyromegaly present.  Cardiovascular: Normal rate, regular rhythm, normal heart sounds and intact distal pulses.  No murmur heard. Pulmonary/Chest: Effort normal and breath sounds normal. No respiratory distress. She has no wheezes. She has no rhonchi.  Abdominal: Soft. Bowel sounds are normal. She exhibits no distension and no mass. There is no abdominal tenderness.  Musculoskeletal:        General: No edema.  Lymphadenopathy:    She has no cervical adenopathy.  Neurological: She is alert and oriented to person, place, and time. She exhibits normal muscle tone. Coordination normal.  Skin: Skin is warm and dry.  Psychiatric: She has a normal mood and affect. Her behavior is normal.     A/P:  1. Annual physical exam - has upcoming mammo appt, colonoscopy UTD, due for PAP in 10/2019 - discussed importance of regular CV exercise, healthy diet, adequate sleep - pt  sees dentist/periodontist regularly, needs vision exam - Lipid panel - VITAMIN D 25 Hydroxy (Vit-D Deficiency, Fractures) - next CPE in 1 year  2. Essential hypertension - BP elevated today on HCTZ 25mg   - pt states "it is 'cause I have an infection" and "lots of stress". She declines adjustment in her BP med regimen but will go to store/pharm a few times in the coming weeks to check her BP and call me with readings  3. Prediabetes - Hemoglobin A1c  4. Seasonal allergies - cont zyrtec, singulair Refill: - triamcinolone (NASACORT) 55 MCG/ACT AERO nasal inhaler; Place 2 sprays into the nose daily.  Dispense: 1 Inhaler; Refill: 6  5. Acute otitis externa of right ear, unspecified type Rx: - ofloxacin (FLOXIN OTIC) 0.3 % OTIC solution; Place 5 drops into the right ear daily for 10 days.  Dispense: 5 mL; Refill: 0 - f/u if symptoms worsen or do not improve in 10-14 days  6. Acute non-recurrent maxillary sinusitis - resume nasacort, cont zyrtec daily Rx: - methylPREDNISolone (MEDROL DOSEPAK) 4 MG TBPK tablet; Take as directed  Dispense: 21 tablet; Refill: 0 - f/u in about 10 days if no/minimal improvement, sooner PRN  7. Influenza vaccination declined  Discussed plan and reviewed medications with patient, including risks, benefits, and potential side effects. Pt expressed understand. All questions answered

## 2019-08-01 NOTE — Addendum Note (Signed)
Addended by: Janan Ridge on: 08/01/2019 11:25 AM   Modules accepted: Orders

## 2019-08-01 NOTE — Patient Instructions (Addendum)
Use ear drops daily x 10 days Take medrol dose pack as directed nasacort refilled   Health Maintenance, Female Adopting a healthy lifestyle and getting preventive care are important in promoting health and wellness. Ask your health care provider about:  The right schedule for you to have regular tests and exams.  Things you can do on your own to prevent diseases and keep yourself healthy. What should I know about diet, weight, and exercise? Eat a healthy diet   Eat a diet that includes plenty of vegetables, fruits, low-fat dairy products, and lean protein.  Do not eat a lot of foods that are high in solid fats, added sugars, or sodium. Maintain a healthy weight Body mass index (BMI) is used to identify weight problems. It estimates body fat based on height and weight. Your health care provider can help determine your BMI and help you achieve or maintain a healthy weight. Get regular exercise Get regular exercise. This is one of the most important things you can do for your health. Most adults should:  Exercise for at least 150 minutes each week. The exercise should increase your heart rate and make you sweat (moderate-intensity exercise).  Do strengthening exercises at least twice a week. This is in addition to the moderate-intensity exercise.  Spend less time sitting. Even light physical activity can be beneficial. Watch cholesterol and blood lipids Have your blood tested for lipids and cholesterol at 56 years of age, then have this test every 5 years. Have your cholesterol levels checked more often if:  Your lipid or cholesterol levels are high.  You are older than 56 years of age.  You are at high risk for heart disease. What should I know about cancer screening? Depending on your health history and family history, you may need to have cancer screening at various ages. This may include screening for:  Breast cancer.  Cervical cancer.  Colorectal cancer.  Skin cancer.   Lung cancer. What should I know about heart disease, diabetes, and high blood pressure? Blood pressure and heart disease  High blood pressure causes heart disease and increases the risk of stroke. This is more likely to develop in people who have high blood pressure readings, are of African descent, or are overweight.  Have your blood pressure checked: ? Every 3-5 years if you are 76-37 years of age. ? Every year if you are 58 years old or older. Diabetes Have regular diabetes screenings. This checks your fasting blood sugar level. Have the screening done:  Once every three years after age 82 if you are at a normal weight and have a low risk for diabetes.  More often and at a younger age if you are overweight or have a high risk for diabetes. What should I know about preventing infection? Hepatitis B If you have a higher risk for hepatitis B, you should be screened for this virus. Talk with your health care provider to find out if you are at risk for hepatitis B infection. Hepatitis C Testing is recommended for:  Everyone born from [birth year] through 1963.  Anyone with known risk factors for hepatitis C. Sexually transmitted infections (STIs)  Get screened for STIs, including gonorrhea and chlamydia, if: ? You are sexually active and are younger than 56 years of age. ? You are older than 56 years of age and your health care provider tells you that you are at risk for this type of infection. ? Your sexual activity has changed since you were last  screened, and you are at increased risk for chlamydia or gonorrhea. Ask your health care provider if you are at risk.  Ask your health care provider about whether you are at high risk for HIV. Your health care provider may recommend a prescription medicine to help prevent HIV infection. If you choose to take medicine to prevent HIV, you should first get tested for HIV. You should then be tested every 3 months for as long as you are taking the  medicine. Pregnancy  If you are about to stop having your period (premenopausal) and you may become pregnant, seek counseling before you get pregnant.  Take 400 to 800 micrograms (mcg) of folic acid every day if you become pregnant.  Ask for birth control (contraception) if you want to prevent pregnancy. Osteoporosis and menopause Osteoporosis is a disease in which the bones lose minerals and strength with aging. This can result in bone fractures. If you are 55 years old or older, or if you are at risk for osteoporosis and fractures, ask your health care provider if you should:  Be screened for bone loss.  Take a calcium or vitamin D supplement to lower your risk of fractures.  Be given hormone replacement therapy (HRT) to treat symptoms of menopause. Follow these instructions at home: Lifestyle  Do not use any products that contain nicotine or tobacco, such as cigarettes, e-cigarettes, and chewing tobacco. If you need help quitting, ask your health care provider.  Do not use street drugs.  Do not share needles.  Ask your health care provider for help if you need support or information about quitting drugs. Alcohol use  Do not drink alcohol if: ? Your health care provider tells you not to drink. ? You are pregnant, may be pregnant, or are planning to become pregnant.  If you drink alcohol: ? Limit how much you use to 0-1 drink a day. ? Limit intake if you are breastfeeding.  Be aware of how much alcohol is in your drink. In the [place] one drink equals one 12 oz bottle of beer (355 mL), one 5 oz glass of wine (148 mL), or one 1 oz glass of hard liquor (44 mL). General instructions  Schedule regular health, dental, and eye exams.  Stay current with your vaccines.  Tell your health care provider if: ? You often feel depressed. ? You have ever been abused or do not feel safe at home. Summary  Adopting a healthy lifestyle and getting preventive care are important in  promoting health and wellness.  Follow your health care provider's instructions about healthy diet, exercising, and getting tested or screened for diseases.  Follow your health care provider's instructions on monitoring your cholesterol and blood pressure. This information is not intended to replace advice given to you by your health care provider. Make sure you discuss any questions you have with your health care provider. Document Released: 05/09/2011 Document Revised: 10/17/2018 Document Reviewed: 10/17/2018 Elsevier Patient Education  2020 [employer].

## 2019-08-02 ENCOUNTER — Encounter: Payer: Self-pay | Admitting: Family Medicine

## 2019-08-20 ENCOUNTER — Other Ambulatory Visit: Payer: Self-pay | Admitting: Family Medicine

## 2019-08-20 DIAGNOSIS — H60501 Unspecified acute noninfective otitis externa, right ear: Secondary | ICD-10-CM

## 2019-08-22 ENCOUNTER — Encounter: Payer: Self-pay | Admitting: Family Medicine

## 2019-08-22 MED ORDER — CEFDINIR 300 MG PO CAPS: 300.0000 mg | ORAL_CAPSULE | Freq: Two times a day (BID) | ORAL | 0 refills | Status: AC

## 2019-08-23 NOTE — Telephone Encounter (Signed)
Pt informed me that she has finished medication.

## 2019-08-28 ENCOUNTER — Other Ambulatory Visit: Payer: Self-pay

## 2019-08-28 ENCOUNTER — Ambulatory Visit (INDEPENDENT_AMBULATORY_CARE_PROVIDER_SITE_OTHER): Payer: Medicare Other

## 2019-08-28 ENCOUNTER — Encounter: Payer: Self-pay | Admitting: Family Medicine

## 2019-08-28 DIAGNOSIS — Z1231 Encounter for screening mammogram for malignant neoplasm of breast: Secondary | ICD-10-CM

## 2019-08-28 NOTE — Telephone Encounter (Signed)
Message sent to Provider view result note for referral to be placed

## 2019-09-03 ENCOUNTER — Encounter: Payer: Self-pay | Admitting: Family Medicine

## 2019-09-04 ENCOUNTER — Encounter: Payer: Self-pay | Admitting: Family Medicine

## 2019-09-04 MED ORDER — OFLOXACIN 0.3 % OT SOLN: 5.0000 [drp] | Freq: Two times a day (BID) | OTIC | 0 refills | Status: AC

## 2019-09-16 ENCOUNTER — Ambulatory Visit (INDEPENDENT_AMBULATORY_CARE_PROVIDER_SITE_OTHER): Payer: Medicare Other | Admitting: Family Medicine

## 2019-09-16 ENCOUNTER — Encounter: Payer: Self-pay | Admitting: Family Medicine

## 2019-09-16 ENCOUNTER — Other Ambulatory Visit: Payer: Self-pay

## 2019-09-16 VITALS — BP 132/92 | HR 88 | Temp 97.7°F | Ht 75.0 in | Wt 192.2 lb

## 2019-09-16 DIAGNOSIS — H6091 Unspecified otitis externa, right ear: Secondary | ICD-10-CM | POA: Diagnosis not present

## 2019-09-16 DIAGNOSIS — H60501 Unspecified acute noninfective otitis externa, right ear: Secondary | ICD-10-CM

## 2019-09-16 DIAGNOSIS — H6090 Unspecified otitis externa, unspecified ear: Secondary | ICD-10-CM | POA: Insufficient documentation

## 2019-09-16 DIAGNOSIS — R300 Dysuria: Secondary | ICD-10-CM

## 2019-09-16 HISTORY — DX: Dysuria: R30.0

## 2019-09-16 LAB — POCT URINALYSIS DIPSTICK
Bilirubin, UA: NEGATIVE
Blood, UA: NEGATIVE
Glucose, UA: NEGATIVE
Ketones, UA: NEGATIVE
Leukocytes, UA: NEGATIVE
Nitrite, UA: NEGATIVE
Protein, UA: NEGATIVE
Spec Grav, UA: 1.025 (ref 1.010–1.025)
Urobilinogen, UA: 0.2 E.U./dL
pH, UA: 6 (ref 5.0–8.0)

## 2019-09-16 MED ORDER — NEOMYCIN-POLYMYXIN-HC 3.5-10000-1 OT SOLN: 3.0000 [drp] | Freq: Four times a day (QID) | OTIC | 0 refills | Status: DC

## 2019-09-16 NOTE — Assessment & Plan Note (Addendum)
Normal UA

## 2019-09-16 NOTE — Progress Notes (Signed)
Janet Richards - 58 y.o. female MRN [id number]  Date of birth: December 11, 1960  Subjective Chief Complaint  Patient presents with  . Otalgia    pt been having right ear pain for about a month was on antibiotics but stopped working.   . Abdominal Pain    pt is also having lower abd discomfort and frequent urination for a few days    HPI Janet Richards is a 58 y.o. female here today with complaint of R ear pain.  Seen on 9/24 for annual exam.  Noted to have OE and started on ofloxacin drops.  Had some improvement initially and then worsening again.  She was subsequently started on cefdinir.  She was prescribed another course of ofloxacin but never picked this up and started.   Ear has been bothering her now for a few days.  She denies changes to hearing, fever, chills, or dizziness.  She does have chronic allergies.    She also reports some lower abdominal discomfort and frequent urination x3 days.  Reports that this feels similar to prior UTI's.  Denies flank pain or vaginal discharge.   ROS:  A comprehensive ROS was completed and negative except as noted per HPI  Allergies  Allergen Reactions  . Azithromycin Nausea And Vomiting  . Clindamycin/Lincomycin Hives    Facial swelling  . Penicillins Swelling and Other (See Comments)    Patient states does not work for her.  She denies swelling or hives from penicillin. She states penicillin not effective for infections. Has taken cephalosporins without problems  . Sulfa Antibiotics Hives    Past Medical History:  Diagnosis Date  . Asthma   . Chronic pancreatitis ([place])   . Essential hypertension, benign   . Menstrual migraine   . Periodontitis   . Prediabetes 10/17/2014  . Primary osteoarthritis of both knees   . Sinusitis   . Urticaria     Past Surgical History:  Procedure Laterality Date  . TUBAL LIGATION  12/26/1987    Social History   Socioeconomic History  . Marital status: Married    Spouse name: Not on file  . Number  of children: 3  . Years of education: Not on file  . Highest education level: Not on file  Occupational History  . Occupation: homemaker  Social Needs  . Financial resource strain: Not on file  . Food insecurity    Worry: Not on file    Inability: Not on file  . Transportation needs    Medical: Not on file    Non-medical: Not on file  Tobacco Use  . Smoking status: Former Smoker    Packs/day: 1.50    Types: Cigarettes    Quit date: 02/05/2006    Years since quitting: 13.6  . Smokeless tobacco: Never Used  Substance and Sexual Activity  . Alcohol use: Yes    Comment: rarely  . Drug use: No  . Sexual activity: Yes    Partners: Male  Lifestyle  . Physical activity    Days per week: Not on file    Minutes per session: Not on file  . Stress: Not on file  Relationships  . Social Herbalist on phone: Not on file    Gets together: Not on file    Attends religious service: Not on file    Active member of club or organization: Not on file    Attends meetings of clubs or organizations: Not on file    Relationship status:  Not on file  Other Topics Concern  . Not on file  Social History Narrative  . Not on file    Family History  Problem Relation Age of Onset  . Hypertension Mother   . CAD Father        MI in his 21s  . Allergic rhinitis Father   . Urticaria Sister   . Colon cancer Sister   . Allergic rhinitis Son   . Asthma Son   . Angioedema Neg Hx   . Eczema Neg Hx   . Immunodeficiency Neg Hx   . Esophageal cancer Neg Hx   . Stomach cancer Neg Hx   . Rectal cancer Neg Hx     Health Maintenance  Topic Date Due  . PAP SMEAR-Modifier  10/26/2019  . MAMMOGRAM  08/27/2021  . TETANUS/TDAP  10/17/2024  . COLONOSCOPY  03/19/2025  . INFLUENZA VACCINE  Completed  . Hepatitis C Screening  Completed  . HIV Screening  Completed     ----------------------------------------------------------------------------------------------------------------------------------------------------------------------------------------------------------------- Physical Exam BP (!) 132/92   Pulse 88   Temp 97.7 F (36.5 C) (Temporal)   Ht 6\' 3"  (1.905 m)   Wt 192 lb 3.2 oz (87.2 kg)   LMP 09/24/2017   SpO2 99%   BMI 24.02 kg/m   Physical Exam Constitutional:      Appearance: Normal appearance. She is well-developed.  HENT:     Head: Normocephalic and atraumatic.     Left Ear: Tympanic membrane and ear canal normal.     Ears:     Comments: R EAC is erythematous with mild swelling and purulent discharge in canal.  TM is intact and normal.      Nose: No congestion.     Mouth/Throat:     Mouth: Mucous membranes are moist.  Eyes:     General: No scleral icterus. Neck:     Musculoskeletal: Neck supple.  Cardiovascular:     Rate and Rhythm: Normal rate and regular rhythm.  Pulmonary:     Effort: Pulmonary effort is normal.     Breath sounds: Normal breath sounds.  Abdominal:     General: Abdomen is flat. There is no distension.     Tenderness: There is no abdominal tenderness. There is no right CVA tenderness or left CVA tenderness.  Lymphadenopathy:     Cervical: No cervical adenopathy.  Neurological:     General: No focal deficit present.     Mental Status: She is alert.  Psychiatric:        Mood and Affect: Mood normal.        Behavior: Behavior normal.     ------------------------------------------------------------------------------------------------------------------------------------------------------------------------------------------------------------------- Assessment and Plan  Otitis externa -Will have her try course of cortisporin drops.  I will go ahead and enter referral to ENT.  -Recommend she keep ears dry.    Dysuria Normal UA

## 2019-09-16 NOTE — Patient Instructions (Signed)

## 2019-09-16 NOTE — Assessment & Plan Note (Signed)
-  Will have her try course of cortisporin drops.  I will go ahead and enter referral to ENT.  -Recommend she keep ears dry.

## 2019-09-21 ENCOUNTER — Encounter: Payer: Self-pay | Admitting: Family Medicine

## 2019-09-22 ENCOUNTER — Encounter: Payer: Self-pay | Admitting: Emergency Medicine

## 2019-09-22 ENCOUNTER — Encounter: Payer: Self-pay | Admitting: Family Medicine

## 2019-09-22 ENCOUNTER — Emergency Department (INDEPENDENT_AMBULATORY_CARE_PROVIDER_SITE_OTHER)
Admission: EM | Admit: 2019-09-22 | Discharge: 2019-09-22 | Disposition: A | Discharge status: Home / Self Care | Payer: Medicare Other | Source: Home / Self Care

## 2019-09-22 ENCOUNTER — Other Ambulatory Visit: Payer: Self-pay

## 2019-09-22 DIAGNOSIS — S60861A Insect bite (nonvenomous) of right wrist, initial encounter: Secondary | ICD-10-CM | POA: Diagnosis not present

## 2019-09-22 DIAGNOSIS — W57XXXA Bitten or stung by nonvenomous insect and other nonvenomous arthropods, initial encounter: Secondary | ICD-10-CM | POA: Diagnosis not present

## 2019-09-22 MED ORDER — MUPIROCIN 2 % EX OINT: TOPICAL_OINTMENT | CUTANEOUS | 0 refills | Status: DC

## 2019-09-22 MED ORDER — CEPHALEXIN 500 MG PO CAPS: 500.0000 mg | ORAL_CAPSULE | Freq: Two times a day (BID) | ORAL | 0 refills | Status: DC

## 2019-09-22 NOTE — Discharge Instructions (Signed)
°  Keep wound clean with warm water and soap. Pat dry then apply prescribed antibiotic ointment.  You may want to cover with a bandage to help keep medication in place and area clean and protected as it heals.   Please take antibiotics as prescribed and be sure to complete entire course even if you start to feel better to ensure infection does not come back.   Please follow up with your family doctor for further evaluation and recheck of symptoms this week if not improving.

## 2019-09-22 NOTE — ED Provider Notes (Signed)
[name]    CSN: [id number] Arrival date & time: 09/22/19  1416      History   Chief Complaint Chief Complaint  Patient presents with  . Insect Bite    HPI DEVYNE BREIDINGER is a 58 y.o. female.   HPI JUSTENE HINCHCLIFFE is a 57 y.o. female presenting to UC with c/o possible insect bite on Right wrist that she noticed yesterday after picking up leaves in her yard. She reports a very red bump to the back of her wrist. She cleaned with peroxide and applied hydrocortisone cream, she notes the redness has come down but there is some swelling.  Denies itching or pain to the area.  Denies fever, chills, n/v/d.   Past Medical History:  Diagnosis Date  . Asthma   . Chronic pancreatitis ([place])   . Essential hypertension, benign   . Menstrual migraine   . Periodontitis   . Prediabetes 10/17/2014  . Primary osteoarthritis of both knees   . Sinusitis   . Urticaria     Patient Active Problem List   Diagnosis Date Noted  . Otitis externa 09/16/2019  . Dysuria 09/16/2019  . Chronic pancreatitis ([place]) 04/10/2019  . Irritable bowel syndrome with diarrhea 09/13/2018  . Environmental allergies 09/12/2018  . Mild intermittent asthma without complication 0000000  . Dyspepsia 07/31/2018  . Sensory neuronopathy 07/29/2018  . Post-menopausal bleeding 05/21/2018  . Large breasts 04/15/2018  . Caregiver stress 03/16/2018  . DDD (degenerative disc disease), cervical 02/22/2018  . Mixed incontinence urge and stress 01/25/2018  . Periodontitis 12/15/2017  . Vaginal dryness, menopausal 03/01/2017  . Abnormal ankle brachial index (ABI) 11/09/2016  . Numbness and tingling of both feet 09/02/2016  . Ganglion cyst of right foot 07/12/2016  . Anemia, iron deficiency 02/25/2016  . Right ankle pain 02/24/2016  . Fatigue 02/24/2016  . History of colonic polyps 03/20/2015  . Hyperlipidemia 02/20/2015  . Other allergic rhinitis 01/14/2015  . Prediabetes 10/17/2014  .  Osteoarthritis of first metatarsophalangeal joint 04/11/2014  . 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 No blood products 03/28/2013  . Essential hypertension 03/01/2013  . Abnormal uterine bleeding 02/05/2013  . Primary osteoarthritis of both knees 12/27/2012    Past Surgical History:  Procedure Laterality Date  . TUBAL LIGATION  12/26/1987    OB History    Gravida  5   Para  3   Term  3   Preterm      AB  2   Living  3     SAB      TAB  2   Ectopic      Multiple      Live Births               Home Medications    Prior to Admission medications   Medication Sig Start Date End Date Taking? Authorizing Provider  cephALEXin (KEFLEX) 500 MG capsule Take 1 capsule (500 mg total) by mouth 2 (two) times daily. 09/22/19   Noe Gens, PA-C  cetirizine HCl (ZYRTEC) 1 MG/ML solution TAKE 10 MLS (10 MG TOTAL) BY MOUTH DAILY. 05/15/19   Cirigliano, Garvin Fila, DO  conjugated estrogens (PREMARIN) vaginal cream Place 1 Applicatorful vaginally daily. For first 2 weeks then decrease to 3 times a week. 09/12/18   Breeback, Royetta Car, PA-C  hydrochlorothiazide (HYDRODIURIL) 25 MG tablet Take 1 tablet (25 mg total) by mouth daily. 05/15/19   Cirigliano, Mary K, DO  ipratropium (ATROVENT) 0.06 % nasal spray Place  2 sprays into both nostrils 4 (four) times daily. 05/15/19   Cirigliano, Mary K, DO  montelukast (SINGULAIR) 10 MG tablet Take 1 tablet once a day for coughing or wheezing. 05/15/19   Cirigliano, Garvin Fila, DO  Multiple Vitamins-Minerals (MULTIVITAMIN ADULT PO) Take by mouth.    [provider]  mupirocin ointment (BACTROBAN) 2 % Apply to wound 3 times daily for 5 days 09/22/19   Noe Gens, PA-C  neomycin-polymyxin-hydrocortisone (CORTISPORIN) OTIC solution Place 3 drops into the right ear 4 (four) times daily. 09/16/19   Luetta Nutting, DO  Olopatadine HCl 0.2 % SOLN Place 1 drop into both eyes daily. 03/22/19   Breeback, Jade L, PA-C  pantoprazole (PROTONIX) 40 MG tablet Take  1 tablet (40 mg total) by mouth daily. 04/09/19   Silverio Decamp, MD  triamcinolone (NASACORT) 55 MCG/ACT AERO nasal inhaler Place 2 sprays into the nose daily. 08/01/19   CiriglianoGarvin Fila, DO    Family History Family History  Problem Relation Age of Onset  . Hypertension Mother   . CAD Father        MI in his 72s  . Allergic rhinitis Father   . Urticaria Sister   . Colon cancer Sister   . Allergic rhinitis Son   . Asthma Son   . Angioedema Neg Hx   . Eczema Neg Hx   . Immunodeficiency Neg Hx   . Esophageal cancer Neg Hx   . Stomach cancer Neg Hx   . Rectal cancer Neg Hx     Social History Social History   Tobacco Use  . Smoking status: Former Smoker    Packs/day: 1.50    Types: Cigarettes    Quit date: 02/05/2006    Years since quitting: 13.6  . Smokeless tobacco: Never Used  Substance Use Topics  . Alcohol use: Yes    Comment: rarely  . Drug use: No     Allergies   Azithromycin, Clindamycin/lincomycin, Penicillins, and Sulfa antibiotics   Review of Systems Review of Systems  Constitutional: Negative for chills and fever.  Gastrointestinal: Negative for diarrhea, nausea and vomiting.  Musculoskeletal: Positive for joint swelling. Negative for arthralgias and myalgias.  Skin: Positive for color change and wound.     Physical Exam Triage Vital Signs ED Triage Vitals  Enc Vitals Group     BP 09/22/19 1442 123/81     Pulse Rate 09/22/19 1442 89     Resp --      Temp 09/22/19 1442 98.2 F (36.8 C)     Temp Source 09/22/19 1442 Oral     SpO2 09/22/19 1442 98 %     Weight 09/22/19 1443 187 lb 4 oz (84.9 kg)     Height --      Head Circumference --      Peak Flow --      Pain Score 09/22/19 1443 0     Pain Loc --      Pain Edu? --      Excl. in [place]? --    No data found.  Updated Vital Signs BP 123/81 (BP Location: Left Arm)   Pulse 89   Temp 98.2 F (36.8 C) (Oral)   Wt 187 lb 4 oz (84.9 kg)   LMP 09/24/2017   SpO2 98%   BMI 23.40 kg/m    Visual Acuity Right Eye Distance:   Left Eye Distance:   Bilateral Distance:    Right Eye Near:   Left Eye Near:  Bilateral Near:     Physical Exam Vitals signs and nursing note reviewed.  Constitutional:      Appearance: Normal appearance. She is well-developed.  HENT:     Head: Normocephalic and atraumatic.  Neck:     Musculoskeletal: Normal range of motion.  Cardiovascular:     Rate and Rhythm: Normal rate.  Pulmonary:     Effort: Pulmonary effort is normal.  Musculoskeletal: Normal range of motion.        General: Swelling present. No tenderness.     Comments: Right wrist, dorsal aspect: mild swelling. Non-tender. Full ROM (see skin exam).  Skin:    General: Skin is warm and dry.     Capillary Refill: Capillary refill takes less than 2 seconds.     Findings: Erythema present.          Comments: Right wrist, dorsal aspect: 35mm area of erythema surrounding by 1cm of swelling and induration. No tenderness or fluctuance. No bleeding or drainage.   Neurological:     Mental Status: She is alert and oriented to person, place, and time.  Psychiatric:        Behavior: Behavior normal.      UC Treatments / Results  Labs (all labs ordered are listed, but only abnormal results are displayed) Labs Reviewed - No data to display  EKG   Radiology No results found.  Procedures Procedures (including critical care time)  Medications Ordered in UC Medications - No data to display  Initial Impression / Assessment and Plan / UC Course  I have reviewed the triage vital signs and the nursing notes.  Pertinent labs & imaging results that were available during my care of the patient were reviewed by me and considered in my medical decision making (see chart for details).     Possible early infection of suspected insect bite. Will start on Keflex, pt states she has had this in the past with no allergic reactions.  Will also start on topical mupirocin. Pt also states she  is okay with starting this medication today. Encouraged to keep clean with soap and water. Home care instructions provided.  *Prior to examining pt, pt reported doubt in my medical decision making from prior visit.  Advised pt she may return tomorrow to see another provider, pt stated she would like to stay and be seen and treated today. After review of medical records pt had waited almost 4 weeks after initial UC (07/08/2019) visit for chest pain to be seen by PCP (08/01/2019) for ear concerns.  *Stressed importance of close follow up and communication with her PCP This Week if symptoms not improving with medication prescribed today, especially if symptoms are worsening. Advised not to wait several weeks.   Final Clinical Impressions(s) / UC Diagnoses   Final diagnoses:  Bug bite with infection, initial encounter     Discharge Instructions      Keep wound clean with warm water and soap. Pat dry then apply prescribed antibiotic ointment.  You may want to cover with a bandage to help keep medication in place and area clean and protected as it heals.   Please take antibiotics as prescribed and be sure to complete entire course even if you start to feel better to ensure infection does not come back.   Please follow up with your family doctor for further evaluation and recheck of symptoms this week if not improving.     ED Prescriptions    Medication Sig Dispense Auth. Provider  cephALEXin (KEFLEX) 500 MG capsule Take 1 capsule (500 mg total) by mouth 2 (two) times daily. 14 capsule Gerarda Fraction, Kylil Swopes O, PA-C   mupirocin ointment (BACTROBAN) 2 % Apply to wound 3 times daily for 5 days 22 g Noe Gens, [place]     PDMP not reviewed this encounter.   Noe Gens, [place] 09/22/19 1540

## 2019-09-22 NOTE — ED Triage Notes (Signed)
Patient c/o possible insect bite on right wrist, some swelling, redness

## 2019-09-23 ENCOUNTER — Other Ambulatory Visit: Payer: Self-pay | Admitting: Sports Medicine

## 2019-09-23 ENCOUNTER — Other Ambulatory Visit: Payer: Self-pay | Admitting: Family Medicine

## 2019-09-23 DIAGNOSIS — R1013 Epigastric pain: Secondary | ICD-10-CM

## 2019-09-23 MED ORDER — NEOMYCIN-POLYMYXIN-HC 3.5-10000-1 OT SOLN: 3.0000 [drp] | Freq: Four times a day (QID) | OTIC | 0 refills | Status: DC

## 2019-09-23 NOTE — Telephone Encounter (Signed)
Rx renewed

## 2019-09-24 MED ORDER — NEOMYCIN-POLYMYXIN-HC 3.5-10000-1 OT SOLN: 3.0000 [drp] | Freq: Four times a day (QID) | OTIC | 0 refills | Status: DC

## 2019-09-24 NOTE — Addendum Note (Signed)
Addended by: Rodrigo Ran on: 09/24/2019 08:13 AM   Modules accepted: Orders

## 2019-10-19 ENCOUNTER — Encounter: Payer: Self-pay | Admitting: Family Medicine

## 2019-10-21 DIAGNOSIS — Z880 Allergy status to penicillin: Secondary | ICD-10-CM | POA: Diagnosis not present

## 2019-10-21 DIAGNOSIS — Z881 Allergy status to other antibiotic agents status: Secondary | ICD-10-CM | POA: Diagnosis not present

## 2019-10-21 DIAGNOSIS — I10 Essential (primary) hypertension: Secondary | ICD-10-CM | POA: Diagnosis not present

## 2019-10-21 DIAGNOSIS — Z87891 Personal history of nicotine dependence: Secondary | ICD-10-CM | POA: Diagnosis not present

## 2019-10-21 DIAGNOSIS — Z79899 Other long term (current) drug therapy: Secondary | ICD-10-CM | POA: Diagnosis not present

## 2019-10-21 DIAGNOSIS — Z888 Allergy status to other drugs, medicaments and biological substances status: Secondary | ICD-10-CM | POA: Diagnosis not present

## 2019-10-21 DIAGNOSIS — J984 Other disorders of lung: Secondary | ICD-10-CM | POA: Diagnosis not present

## 2019-10-21 DIAGNOSIS — R079 Chest pain, unspecified: Secondary | ICD-10-CM | POA: Diagnosis not present

## 2019-10-21 DIAGNOSIS — R05 Cough: Secondary | ICD-10-CM | POA: Diagnosis not present

## 2019-10-21 DIAGNOSIS — R0789 Other chest pain: Secondary | ICD-10-CM | POA: Diagnosis not present

## 2019-10-21 MED ORDER — BARO-CAT PO
10.00 | ORAL | Status: DC
Start: ? — End: 2019-10-21

## 2019-10-21 MED ORDER — Medication
Status: DC
Start: ? — End: 2019-10-21

## 2019-10-23 ENCOUNTER — Ambulatory Visit (INDEPENDENT_AMBULATORY_CARE_PROVIDER_SITE_OTHER): Payer: Medicare Other | Admitting: Obstetrics & Gynecology

## 2019-10-23 ENCOUNTER — Encounter: Payer: Self-pay | Admitting: Obstetrics & Gynecology

## 2019-10-23 ENCOUNTER — Other Ambulatory Visit: Payer: Self-pay

## 2019-10-23 ENCOUNTER — Other Ambulatory Visit (HOSPITAL_COMMUNITY)
Admission: RE | Admit: 2019-10-23 | Discharge: 2019-10-23 | Disposition: A | Discharge status: Home / Self Care | Payer: Medicare Other | Source: Ambulatory Visit | Attending: Obstetrics & Gynecology | Admitting: Obstetrics & Gynecology

## 2019-10-23 VITALS — BP 121/72 | HR 86 | Temp 98.2°F | Resp 16 | Ht 63.0 in | Wt 187.0 lb

## 2019-10-23 DIAGNOSIS — N898 Other specified noninflammatory disorders of vagina: Secondary | ICD-10-CM | POA: Diagnosis not present

## 2019-10-23 DIAGNOSIS — Z1151 Encounter for screening for human papillomavirus (HPV): Secondary | ICD-10-CM | POA: Diagnosis not present

## 2019-10-23 DIAGNOSIS — Z01419 Encounter for gynecological examination (general) (routine) without abnormal findings: Secondary | ICD-10-CM | POA: Diagnosis not present

## 2019-10-23 DIAGNOSIS — Z780 Asymptomatic menopausal state: Secondary | ICD-10-CM | POA: Insufficient documentation

## 2019-10-23 DIAGNOSIS — Z87410 Personal history of cervical dysplasia: Secondary | ICD-10-CM | POA: Diagnosis not present

## 2019-10-23 MED ORDER — GENERIC EXTERNAL MEDICATION
Status: DC
Start: ? — End: 2019-10-23

## 2019-10-23 NOTE — Progress Notes (Signed)
Subjective:    Janet Richards is a 58 y.o. married P3 (12, 19, 61, and 68 yo kids, 2 grands) who presents for an annual exam. The patient has no complaints today except that after sex she notices a vaginal smell. The patient is sexually active rarely due to her husband's health issues. GYN screening history: last pap: was normal. The patient wears seatbelts: yes. The patient participates in regular exercise: yes. Has the patient ever been transfused or tattooed?: no. The patient reports that there is not domestic violence in her life.   Menstrual History: OB History    Gravida  5   Para  3   Term  3   Preterm      AB  2   Living  3     SAB      TAB  2   Ectopic      Multiple      Live Births              Menarche age: 89 Patient's last menstrual period was 09/24/2017.    The following portions of the patient's history were reviewed and updated as appropriate: allergies, current medications, past family history, past medical history, past social history, past surgical history and problem list.  Review of Systems Pertinent items are noted in HPI.   S/p colonoscopy at 58 yo, normal Mammogram last month She declines a flu vaccine.   Objective:    BP 121/72   Pulse 86   Temp 98.2 F (36.8 C) (Temporal)   Resp 16   Ht 5\' 3"  (1.6 m)   Wt 187 lb (84.8 kg)   LMP 09/24/2017   BMI 33.13 kg/m   General Appearance:    Alert, cooperative, no distress, appears stated age  Head:    Normocephalic, without obvious abnormality, atraumatic  Eyes:    PERRL, conjunctiva/corneas clear, EOM's intact, fundi    benign, both eyes  Ears:    Normal TM's and external ear canals, both ears  Nose:   Nares normal, septum midline, mucosa normal, no drainage    or sinus tenderness  Throat:   Lips, mucosa, and tongue normal; teeth and gums normal  Neck:   Supple, symmetrical, trachea midline, no adenopathy;    thyroid:  no enlargement/tenderness/nodules; no carotid   bruit or JVD   Back:     Symmetric, no curvature, ROM normal, no CVA tenderness  Lungs:     Clear to auscultation bilaterally, respirations unlabored  Chest Wall:    No tenderness or deformity   Heart:    Regular rate and rhythm, S1 and S2 normal, no murmur, rub   or gallop  Breast Exam:    No tenderness, masses, or nipple abnormality  Abdomen:     Soft, non-tender, bowel sounds active all four quadrants,    no masses, no organomegaly  Genitalia:    Normal female without lesion, discharge or tenderness, upper limit of normal size and shape, anteverted, mobile, non-tender, normal adnexal exam      Extremities:   Extremities normal, atraumatic, no cyanosis or edema  Pulses:   2+ and symmetric all extremities  Skin:   Skin color, texture, turgor normal, no rashes or lesions  Lymph nodes:   Cervical, supraclavicular, and axillary nodes normal  Neurologic:   CNII-XII intact, normal strength, sensation and reflexes    throughout  .    Assessment:    Healthy female exam.    Plan:     Thin  prep Pap smear.  with cotesting

## 2019-10-24 LAB — CYTOLOGY - PAP
Comment: NEGATIVE
Diagnosis: NEGATIVE
High risk HPV: NEGATIVE

## 2019-10-24 LAB — CERVICOVAGINAL ANCILLARY ONLY
Bacterial Vaginitis (gardnerella): NEGATIVE
Candida Glabrata: NEGATIVE
Candida Vaginitis: NEGATIVE
Comment: NEGATIVE
Comment: NEGATIVE
Comment: NEGATIVE
Comment: NEGATIVE
Trichomonas: NEGATIVE

## 2019-10-25 ENCOUNTER — Encounter: Payer: Self-pay | Admitting: Family Medicine

## 2019-10-25 ENCOUNTER — Telehealth (INDEPENDENT_AMBULATORY_CARE_PROVIDER_SITE_OTHER): Payer: Medicare Other | Admitting: Family Medicine

## 2019-10-25 VITALS — Temp 96.8°F | Ht 63.0 in | Wt 184.0 lb

## 2019-10-25 DIAGNOSIS — R1013 Epigastric pain: Secondary | ICD-10-CM

## 2019-10-25 DIAGNOSIS — R0789 Other chest pain: Secondary | ICD-10-CM | POA: Diagnosis not present

## 2019-10-25 NOTE — Progress Notes (Signed)
Virtual Visit via Video Note  I connected with Janet Richards on 10/25/19 at  1:30 PM EST by a video enabled telemedicine application and verified that I am speaking with the correct person using two identifiers. Location patient: home Location provider: work  Persons participating in the virtual visit: patient, provider  I discussed the limitations of evaluation and management by telemedicine and the availability of in person appointments. The patient expressed understanding and agreed to proceed.  No chief complaint on file.    HPI: Janet Richards is a 58 y.o. female who was seen at [facility] ER on 12/14 for a few day h/o sharp, intermittent Lt sided chest pain. EKG, CXR labs including troponin, d-dimer, CBC, CMP, as well as COVID test all normal/negative. BP was elevated at 171/84 but all other VS WNL. She feels better than she did when she went to the ER. She has some discomfort in her throat, burning/acid-type sensation. She is on protonix now and has been for some time. She states nexium and prilosec were not effective in the past. She takes Tums PRN. She states she has modified her diet.   Pt has a PMHx significant for HTN and predaibetes. Fam h/o HTN (mother) and CAD w/ MI (father). Pt is a former smoker but quit 13 years ago.  Past Medical History:  Diagnosis Date  . Asthma   . Chronic pancreatitis ([place])   . Essential hypertension, benign   . Menstrual migraine   . Periodontitis   . Prediabetes 10/17/2014  . Primary osteoarthritis of both knees   . Sinusitis   . Urticaria     Past Surgical History:  Procedure Laterality Date  . TUBAL LIGATION  12/26/1987    Family History  Problem Relation Age of Onset  . Hypertension Mother   . CAD Father        MI in his 50s  . Allergic rhinitis Father   . Urticaria Sister   . Colon cancer Sister   . Allergic rhinitis Son   . Asthma Son   . Angioedema Neg Hx   . Eczema Neg Hx   . Immunodeficiency Neg  Hx   . Esophageal cancer Neg Hx   . Stomach cancer Neg Hx   . Rectal cancer Neg Hx     Social History   Tobacco Use  . Smoking status: Former Smoker    Packs/day: 1.50    Types: Cigarettes    Quit date: 02/05/2006    Years since quitting: 13.7  . Smokeless tobacco: Never Used  Substance Use Topics  . Alcohol use: Yes    Comment: rarely  . Drug use: No     Current Outpatient Medications:  .  cetirizine HCl (ZYRTEC) 1 MG/ML solution, TAKE 10 MLS (10 MG TOTAL) BY MOUTH DAILY., Disp: 944 mL, Rfl: 1 .  conjugated estrogens (PREMARIN) vaginal cream, Place 1 Applicatorful vaginally daily. For first 2 weeks then decrease to 3 times a week., Disp: 42.5 g, Rfl: 12 .  hydrochlorothiazide (HYDRODIURIL) 25 MG tablet, Take 1 tablet (25 mg total) by mouth daily., Disp: 90 tablet, Rfl: 3 .  montelukast (SINGULAIR) 10 MG tablet, Take 1 tablet once a day for coughing or wheezing., Disp: 90 tablet, Rfl: 3 .  Multiple Vitamins-Minerals (MULTIVITAMIN ADULT PO), Take by mouth., Disp: , Rfl:  .  pantoprazole (PROTONIX) 40 MG tablet, Take 1 tablet (40 mg total) by mouth daily., Disp: 60 tablet, Rfl: 3 .  triamcinolone (NASACORT) 55 MCG/ACT AERO  nasal inhaler, Place 2 sprays into the nose daily., Disp: 1 Inhaler, Rfl: 6  Current Facility-Administered Medications:  .  0.9 %  sodium chloride infusion, 500 mL, Intravenous, Once, Baillie Mohammad, Vito V, DO  Allergies  Allergen Reactions  . Azithromycin Nausea And Vomiting  . Clindamycin/Lincomycin Hives    Facial swelling  . Penicillins Swelling and Other (See Comments)    Patient states does not work for her.  She denies swelling or hives from penicillin. She states penicillin not effective for infections. Has taken cephalosporins without problems      ROS: See pertinent positives and negatives per HPI.   EXAM:  VITALS per patient if applicable: Temp (!) 99991111 F (36 C) (Temporal)   Ht 5\' 3"  (1.6 m)   Wt 184 lb (83.5 kg)   LMP 09/24/2017   BMI  32.59 kg/m    GENERAL: alert, oriented, appears well and in no acute distress  HEENT: atraumatic, conjunctiva clear, no obvious abnormalities on inspection of external nose and ears  NECK: normal movements of the head and neck  LUNGS: on inspection no signs of respiratory distress, breathing rate appears normal, no obvious gross SOB, gasping or wheezing, no conversational dyspnea  CV: no obvious cyanosis  MS: moves all visible extremities without noticeable abnormality  PSYCH/NEURO: pleasant and cooperative, speech grossly intact   ASSESSMENT AND PLAN:  1. Atypical chest pain 2. Dyspepsia - improved - diet modification to limit typical and pts known GERD trigger foods - increase protonix to BID x 1-2 wks then decrease back to daily - ok to take Tums PRN - f/u if symptoms worsen or do not continue to improve    I discussed the assessment and treatment plan with the patient. The patient was provided an opportunity to ask questions and all were answered. The patient agreed with the plan and demonstrated an understanding of the instructions.   The patient was advised to call back or seek an in-person evaluation if the symptoms worsen or if the condition fails to improve as anticipated.   Letta Median, DO

## 2019-10-29 ENCOUNTER — Encounter: Payer: Self-pay | Admitting: Family Medicine

## 2019-10-30 ENCOUNTER — Encounter: Payer: Self-pay | Admitting: Family Medicine

## 2019-11-06 ENCOUNTER — Encounter: Payer: Self-pay | Admitting: Family Medicine

## 2019-11-06 ENCOUNTER — Emergency Department (INDEPENDENT_AMBULATORY_CARE_PROVIDER_SITE_OTHER)
Admission: EM | Admit: 2019-11-06 | Discharge: 2019-11-06 | Disposition: A | Discharge status: Home / Self Care | Payer: Medicare Other | Source: Home / Self Care

## 2019-11-06 ENCOUNTER — Other Ambulatory Visit: Payer: Self-pay

## 2019-11-06 DIAGNOSIS — J0100 Acute maxillary sinusitis, unspecified: Secondary | ICD-10-CM

## 2019-11-06 MED ORDER — CEPHALEXIN 500 MG PO CAPS: 500.0000 mg | ORAL_CAPSULE | Freq: Three times a day (TID) | ORAL | 0 refills | Status: DC

## 2019-11-06 NOTE — ED Provider Notes (Signed)
[facility]    CSN: [id number] Arrival date & time: 11/06/19  1842      History   Chief Complaint Chief Complaint  Patient presents with  . Facial Pain    HPI Janet Richards is a 58 y.o. female.   This is a 58 year old established [facility] urgent care patient who presents with headache.  It began today and she complains of frontal head pain, constant, although a bit better.  She also has had two ear infections this past fall.  She takes Nasacort regularly.  No fever or cough.  No loss of sense of smell.     Past Medical History:  Diagnosis Date  . Asthma   . Chronic pancreatitis ([place])   . Essential hypertension, benign   . Menstrual migraine   . Periodontitis   . Prediabetes 10/17/2014  . Primary osteoarthritis of both knees   . Sinusitis   . Urticaria     Patient Active Problem List   Diagnosis Date Noted  . Otitis externa 09/16/2019  . Dysuria 09/16/2019  . Chronic pancreatitis ([place]) 04/10/2019  . Irritable bowel syndrome with diarrhea 09/13/2018  . Environmental allergies 09/12/2018  . Mild intermittent asthma without complication 0000000  . Dyspepsia 07/31/2018  . Sensory neuronopathy 07/29/2018  . Post-menopausal bleeding 05/21/2018  . Large breasts 04/15/2018  . Caregiver stress 03/16/2018  . DDD (degenerative disc disease), cervical 02/22/2018  . Mixed incontinence urge and stress 01/25/2018  . Periodontitis 12/15/2017  . Vaginal dryness, menopausal 03/01/2017  . Abnormal ankle brachial index (ABI) 11/09/2016  . Numbness and tingling of both feet 09/02/2016  . Ganglion cyst of right foot 07/12/2016  . Anemia, iron deficiency 02/25/2016  . Right ankle pain 02/24/2016  . Fatigue 02/24/2016  . History of colonic polyps 03/20/2015  . Hyperlipidemia 02/20/2015  . Other allergic rhinitis 01/14/2015  . Prediabetes 10/17/2014  . Osteoarthritis of first metatarsophalangeal joint 04/11/2014  . 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  No blood products 03/28/2013  . Essential hypertension 03/01/2013  . Abnormal uterine bleeding 02/05/2013  . Primary osteoarthritis of both knees 12/27/2012    Past Surgical History:  Procedure Laterality Date  . TUBAL LIGATION  12/26/1987    OB History    Gravida  5   Para  3   Term  3   Preterm      AB  2   Living  3     SAB      TAB  2   Ectopic      Multiple      Live Births               Home Medications    Prior to Admission medications   Medication Sig Start Date End Date Taking? Authorizing Provider  cephALEXin (KEFLEX) 500 MG capsule Take 1 capsule (500 mg total) by mouth 3 (three) times daily. 11/06/19   Robyn Haber, MD  cetirizine HCl (ZYRTEC) 1 MG/ML solution TAKE 10 MLS (10 MG TOTAL) BY MOUTH DAILY. 05/15/19   Cirigliano, Garvin Fila, DO  conjugated estrogens (PREMARIN) vaginal cream Place 1 Applicatorful vaginally daily. For first 2 weeks then decrease to 3 times a week. 09/12/18   Breeback, Royetta Car, PA-C  hydrochlorothiazide (HYDRODIURIL) 25 MG tablet Take 1 tablet (25 mg total) by mouth daily. 05/15/19   Cirigliano, Mary K, DO  montelukast (SINGULAIR) 10 MG tablet Take 1 tablet once a day for coughing or wheezing. 05/15/19   Ronnald Nian, DO  Multiple Vitamins-Minerals (  MULTIVITAMIN ADULT PO) Take by mouth.    [provider]  pantoprazole (PROTONIX) 40 MG tablet Take 1 tablet (40 mg total) by mouth daily. 04/09/19   Silverio Decamp, MD  triamcinolone (NASACORT) 55 MCG/ACT AERO nasal inhaler Place 2 sprays into the nose daily. 08/01/19   CiriglianoGarvin Fila, DO    Family History Family History  Problem Relation Age of Onset  . Hypertension Mother   . CAD Father        MI in his 11s  . Allergic rhinitis Father   . Urticaria Sister   . Colon cancer Sister   . Allergic rhinitis Son   . Asthma Son   . Angioedema Neg Hx   . Eczema Neg Hx   . Immunodeficiency Neg Hx   . Esophageal cancer Neg Hx   . Stomach cancer Neg Hx   .  Rectal cancer Neg Hx     Social History Social History   Tobacco Use  . Smoking status: Former Smoker    Packs/day: 1.50    Types: Cigarettes    Quit date: 02/05/2006    Years since quitting: 13.7  . Smokeless tobacco: Never Used  Substance Use Topics  . Alcohol use: Yes    Comment: rarely  . Drug use: No     Allergies   Azithromycin, Clindamycin/lincomycin, and Penicillins   Review of Systems Review of Systems  Constitutional: Negative.   HENT: Positive for sinus pressure and sinus pain.   Respiratory: Negative for cough and shortness of breath.   Neurological: Positive for headaches.  All other systems reviewed and are negative.    Physical Exam Triage Vital Signs ED Triage Vitals  Enc Vitals Group     BP      Pulse      Resp      Temp      Temp src      SpO2      Weight      Height      Head Circumference      Peak Flow      Pain Score      Pain Loc      Pain Edu?      Excl. in [place]?    No data found.  Updated Vital Signs BP (!) 146/81 (BP Location: Left Arm)   Pulse 91   Temp 98.5 F (36.9 C) (Oral)   Resp 16   Ht 5\' 3"  (1.6 m)   Wt 83 kg   LMP 09/24/2017   SpO2 98%   BMI 32.41 kg/m    Physical Exam Vitals and nursing note reviewed.  Constitutional:      General: She is not in acute distress.    Appearance: Normal appearance. She is obese.  HENT:     Head: Normocephalic.     Right Ear: External ear normal.     Left Ear: External ear normal.     Ears:     Comments: Deformed right TM    Mouth/Throat:     Mouth: Mucous membranes are moist.  Eyes:     Conjunctiva/sclera: Conjunctivae normal.  Cardiovascular:     Rate and Rhythm: Normal rate.  Pulmonary:     Effort: Pulmonary effort is normal.  Musculoskeletal:        General: Normal range of motion.     Cervical back: Normal range of motion and neck supple.  Skin:    General: Skin is warm and dry.  Neurological:  General: No focal deficit present.     Mental Status: She is  alert.  Psychiatric:        Mood and Affect: Mood normal.      UC Treatments / Results  Labs (all labs ordered are listed, but only abnormal results are displayed) Labs Reviewed - No data to display  EKG   Radiology No results found.  Procedures Procedures (including critical care time)  Medications Ordered in UC Medications - No data to display  Initial Impression / Assessment and Plan / UC Course  I have reviewed the triage vital signs and the nursing notes.  Pertinent labs & imaging results that were available during my care of the patient were reviewed by me and considered in my medical decision making (see chart for details).    Final Clinical Impressions(s) / UC Diagnoses   Final diagnoses:  Acute non-recurrent maxillary sinusitis     Discharge Instructions     Take one prednisone daily in addition to the antibiotic to open the sinuses fully and continue the nasacort  Vitamin D3 5000 IU (125 mg) daily Vitamin C 500 mg twice daily Zinc 50 to 75 mg daily       ED Prescriptions    Medication Sig Dispense Auth. Provider   cephALEXin (KEFLEX) 500 MG capsule Take 1 capsule (500 mg total) by mouth 3 (three) times daily. 21 capsule Robyn Haber, MD     I have reviewed the PDMP during this encounter.   Robyn Haber, MD 11/06/19 [phone]

## 2019-11-06 NOTE — ED Triage Notes (Signed)
Pt c/o facial pain x several days. Woke up this am with her RT eye swollen shut, says her eye was red with a slight discharge. Had severe ear infection recently and is wondering if she now has a sinus infection.

## 2019-11-06 NOTE — Discharge Instructions (Signed)
Take one prednisone daily in addition to the antibiotic to open the sinuses fully and continue the nasacort  Vitamin D3 5000 IU (125 mg) daily Vitamin C 500 mg twice daily Zinc 50 to 75 mg daily

## 2019-11-26 ENCOUNTER — Emergency Department (INDEPENDENT_AMBULATORY_CARE_PROVIDER_SITE_OTHER)
Admission: EM | Admit: 2019-11-26 | Discharge: 2019-11-26 | Disposition: A | Discharge status: Home / Self Care | Payer: Medicare Other | Source: Home / Self Care

## 2019-11-26 ENCOUNTER — Encounter: Payer: Self-pay | Admitting: Emergency Medicine

## 2019-11-26 ENCOUNTER — Other Ambulatory Visit: Payer: Self-pay

## 2019-11-26 DIAGNOSIS — J0110 Acute frontal sinusitis, unspecified: Secondary | ICD-10-CM

## 2019-11-26 MED ORDER — DOXYCYCLINE HYCLATE 100 MG PO CAPS: 100.0000 mg | ORAL_CAPSULE | Freq: Two times a day (BID) | ORAL | 0 refills | Status: DC

## 2019-11-26 MED ORDER — PREDNISONE 10 MG PO TABS: 40.0000 mg | ORAL_TABLET | Freq: Every day | ORAL | 0 refills | Status: DC

## 2019-11-26 NOTE — ED Triage Notes (Signed)
Patient states she has had pain across her forehead and bridge of nose for past 2 weeks; no fever.  Does not take influenza vacc. Denies contact with any covid positive person.

## 2019-11-26 NOTE — ED Provider Notes (Signed)
[name]    CSN: [id number] Arrival date & time: 11/26/19  1228      History   Chief Complaint Chief Complaint  Patient presents with  . Headache    HPI SHONTEE ELEBY is a 59 y.o. female.   59 year old female, with history of prediabetes, sinusitis, hypertension, migraines, presenting today complaining of a headache.  Patient states that she has a frontal headache.  States that she is also had some associated congestion as well as some right ear pain.  States that she was treated for an ear infection several weeks ago.  Was seen in this urgent care on 12/30 was given antibiotics and steroids for sinusitis.  Patient states that the medications helped her tremendously but now her symptoms have returned.  Continues to complain of a frontal headache with some nasal congestion.  No fever or chills.  No blurred vision, photophobia, dizziness, lightheadedness, neck pain or stiffness.  The history is provided by the patient.  Headache Pain location:  Frontal Quality:  Dull Radiates to:  Does not radiate Severity currently:  5/10 Severity at highest:  5/10 Onset quality:  Gradual Duration:  1 week Timing:  Constant Progression:  Unchanged Chronicity:  Recurrent Similar to prior headaches: yes   Context: not activity, not exposure to bright light, not caffeine, not coughing, not defecating, not eating, not stress, not exposure to cold air, not intercourse, not loud noise and not straining   Relieved by:  Nothing Worsened by:  Nothing Ineffective treatments:  None tried Associated symptoms: congestion, ear pain and sinus pressure   Associated symptoms: no abdominal pain, no back pain, no blurred vision, no cough, no diarrhea, no dizziness, no drainage, no eye pain, no facial pain, no fatigue, no fever, no focal weakness, no hearing loss, no loss of balance, no myalgias, no nausea, no near-syncope, no neck pain, no neck stiffness, no numbness, no paresthesias, no  photophobia, no seizures, no sore throat, no swollen glands, no syncope, no tingling, no URI, no visual change, no vomiting and no weakness   Risk factors: no anger, no family hx of SAH, does not have insomnia and lifestyle not sedentary     Past Medical History:  Diagnosis Date  . Asthma   . Chronic pancreatitis ([place])   . Essential hypertension, benign   . Menstrual migraine   . Periodontitis   . Prediabetes 10/17/2014  . Primary osteoarthritis of both knees   . Sinusitis   . Urticaria     Patient Active Problem List   Diagnosis Date Noted  . Otitis externa 09/16/2019  . Dysuria 09/16/2019  . Chronic pancreatitis ([place]) 04/10/2019  . Irritable bowel syndrome with diarrhea 09/13/2018  . Environmental allergies 09/12/2018  . Mild intermittent asthma without complication 0000000  . Dyspepsia 07/31/2018  . Sensory neuronopathy 07/29/2018  . Post-menopausal bleeding 05/21/2018  . Large breasts 04/15/2018  . Caregiver stress 03/16/2018  . DDD (degenerative disc disease), cervical 02/22/2018  . Mixed incontinence urge and stress 01/25/2018  . Periodontitis 12/15/2017  . Vaginal dryness, menopausal 03/01/2017  . Abnormal ankle brachial index (ABI) 11/09/2016  . Numbness and tingling of both feet 09/02/2016  . Ganglion cyst of right foot 07/12/2016  . Anemia, iron deficiency 02/25/2016  . Right ankle pain 02/24/2016  . Fatigue 02/24/2016  . History of colonic polyps 03/20/2015  . Hyperlipidemia 02/20/2015  . Other allergic rhinitis 01/14/2015  . Prediabetes 10/17/2014  . Osteoarthritis of first metatarsophalangeal joint 04/11/2014  . 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  No blood products 03/28/2013  . Essential hypertension 03/01/2013  . Abnormal uterine bleeding 02/05/2013  . Primary osteoarthritis of both knees 12/27/2012    Past Surgical History:  Procedure Laterality Date  . TUBAL LIGATION  12/26/1987    OB History    Gravida  5   Para  3   Term  3    Preterm      AB  2   Living  3     SAB      TAB  2   Ectopic      Multiple      Live Births               Home Medications    Prior to Admission medications   Medication Sig Start Date End Date Taking? Authorizing Provider  cephALEXin (KEFLEX) 500 MG capsule Take 1 capsule (500 mg total) by mouth 3 (three) times daily. 11/06/19   Robyn Haber, MD  cetirizine HCl (ZYRTEC) 1 MG/ML solution TAKE 10 MLS (10 MG TOTAL) BY MOUTH DAILY. 05/15/19   Cirigliano, Garvin Fila, DO  conjugated estrogens (PREMARIN) vaginal cream Place 1 Applicatorful vaginally daily. For first 2 weeks then decrease to 3 times a week. 09/12/18   Breeback, Royetta Car, PA-C  doxycycline (VIBRAMYCIN) 100 MG capsule Take 1 capsule (100 mg total) by mouth 2 (two) times daily. 11/26/19   Kodi Guerrera C, PA-C  hydrochlorothiazide (HYDRODIURIL) 25 MG tablet Take 1 tablet (25 mg total) by mouth daily. 05/15/19   Cirigliano, Mary K, DO  montelukast (SINGULAIR) 10 MG tablet Take 1 tablet once a day for coughing or wheezing. 05/15/19   Cirigliano, Garvin Fila, DO  Multiple Vitamins-Minerals (MULTIVITAMIN ADULT PO) Take by mouth.    [provider]  pantoprazole (PROTONIX) 40 MG tablet Take 1 tablet (40 mg total) by mouth daily. 04/09/19   Silverio Decamp, MD  predniSONE (DELTASONE) 10 MG tablet Take 4 tablets (40 mg total) by mouth daily for 5 days. 11/26/19 12/01/19  Tate Zagal C, PA-C  triamcinolone (NASACORT) 55 MCG/ACT AERO nasal inhaler Place 2 sprays into the nose daily. 08/01/19   CiriglianoGarvin Fila, DO    Family History Family History  Problem Relation Age of Onset  . Hypertension Mother   . CAD Father        MI in his 80s  . Allergic rhinitis Father   . Urticaria Sister   . Colon cancer Sister   . Allergic rhinitis Son   . Asthma Son   . Angioedema Neg Hx   . Eczema Neg Hx   . Immunodeficiency Neg Hx   . Esophageal cancer Neg Hx   . Stomach cancer Neg Hx   . Rectal cancer Neg Hx     Social  History Social History   Tobacco Use  . Smoking status: Former Smoker    Packs/day: 1.50    Types: Cigarettes    Quit date: 02/05/2006    Years since quitting: 13.8  . Smokeless tobacco: Never Used  Substance Use Topics  . Alcohol use: Yes    Comment: rarely  . Drug use: No     Allergies   Azithromycin, Clindamycin/lincomycin, and Penicillins   Review of Systems Review of Systems  Constitutional: Negative for chills, fatigue and fever.  HENT: Positive for congestion, ear pain and sinus pressure. Negative for hearing loss, postnasal drip and sore throat.   Eyes: Negative for blurred vision, photophobia, pain and visual disturbance.  Respiratory: Negative for cough and  shortness of breath.   Cardiovascular: Negative for chest pain, palpitations, syncope and near-syncope.  Gastrointestinal: Negative for abdominal pain, diarrhea, nausea and vomiting.  Genitourinary: Negative for dysuria and hematuria.  Musculoskeletal: Negative for arthralgias, back pain, myalgias, neck pain and neck stiffness.  Skin: Negative for color change and rash.  Neurological: Positive for headaches. Negative for dizziness, focal weakness, seizures, syncope, weakness, numbness, paresthesias and loss of balance.  All other systems reviewed and are negative.    Physical Exam Triage Vital Signs ED Triage Vitals [11/26/19 1255]  Enc Vitals Group     BP 129/82     Pulse Rate 98     Resp 16     Temp 98.4 F (36.9 C)     Temp Source Oral     SpO2 97 %     Weight 182 lb 15.7 oz (83 kg)     Height 5\' 3"  (1.6 m)     Head Circumference      Peak Flow      Pain Score 3     Pain Loc      Pain Edu?      Excl. in [place]?    No data found.  Updated Vital Signs BP 129/82 (BP Location: Right Arm)   Pulse 98   Temp 98.4 F (36.9 C) (Oral)   Resp 16   Ht 5\' 3"  (1.6 m)   Wt 182 lb 15.7 oz (83 kg)   LMP 09/24/2017   SpO2 97%   BMI 32.41 kg/m   Visual Acuity Right Eye Distance:   Left Eye Distance:    Bilateral Distance:    Right Eye Near:   Left Eye Near:    Bilateral Near:     Physical Exam Vitals and nursing note reviewed.  Constitutional:      General: She is not in acute distress.    Appearance: She is well-developed.  HENT:     Head: Normocephalic and atraumatic.     Left Ear: A middle ear effusion is present.     Ears:     Comments: Abnormal appearance of the right TM    Nose:     Right Sinus: Frontal sinus tenderness present.     Left Sinus: Frontal sinus tenderness present.     Mouth/Throat:     Mouth: Mucous membranes are moist.     Pharynx: Oropharynx is clear.  Eyes:     Conjunctiva/sclera: Conjunctivae normal.  Cardiovascular:     Rate and Rhythm: Normal rate and regular rhythm.     Heart sounds: No murmur.  Pulmonary:     Effort: Pulmonary effort is normal. No respiratory distress.     Breath sounds: Normal breath sounds.  Abdominal:     Palpations: Abdomen is soft.     Tenderness: There is no abdominal tenderness.  Musculoskeletal:     Cervical back: Neck supple.  Skin:    General: Skin is warm and dry.  Neurological:     Mental Status: She is alert.     GCS: GCS eye subscore is 4. GCS verbal subscore is 5. GCS motor subscore is 6.     Cranial Nerves: Cranial nerves are intact.     Sensory: Sensation is intact.     Motor: Motor function is intact.     Coordination: Coordination is intact.     Gait: Gait is intact.     Deep Tendon Reflexes: Reflexes are normal and symmetric.      UC Treatments / Results  Labs (all labs ordered are listed, but only abnormal results are displayed) Labs Reviewed - No data to display  EKG   Radiology No results found.  Procedures Procedures (including critical care time)  Medications Ordered in UC Medications - No data to display  Initial Impression / Assessment and Plan / UC Course  I have reviewed the triage vital signs and the nursing notes.  Pertinent labs & imaging results that were available  during my care of the patient were reviewed by me and considered in my medical decision making (see chart for details).     Frontal headache with congestion Recently treated with Keflex and prednisone for sinusitis.  Symptoms resolved and then returned once medications were completed. Exam and history consistent with frontal sinusitis.  Will treat with doxycycline and prednisone. Final Clinical Impressions(s) / UC Diagnoses   Final diagnoses:  Acute non-recurrent frontal sinusitis   Discharge Instructions   None    ED Prescriptions    Medication Sig Dispense Auth. Provider   doxycycline (VIBRAMYCIN) 100 MG capsule Take 1 capsule (100 mg total) by mouth 2 (two) times daily. 20 capsule Bobie Caris C, PA-C   predniSONE (DELTASONE) 10 MG tablet Take 4 tablets (40 mg total) by mouth daily for 5 days. 20 tablet Baley Shands C, PA-C     PDMP not reviewed this encounter.   Phebe Colla, [place] 11/26/19 1310

## 2019-11-27 ENCOUNTER — Encounter: Payer: Self-pay | Admitting: Family Medicine

## 2019-11-27 ENCOUNTER — Telehealth (INDEPENDENT_AMBULATORY_CARE_PROVIDER_SITE_OTHER): Payer: Medicare Other | Admitting: Family Medicine

## 2019-11-27 VITALS — Temp 98.4°F | Ht 63.0 in

## 2019-11-27 DIAGNOSIS — J0110 Acute frontal sinusitis, unspecified: Secondary | ICD-10-CM | POA: Diagnosis not present

## 2019-11-27 MED ORDER — PREDNISONE 20 MG PO TABS: ORAL_TABLET | ORAL | 0 refills | Status: DC

## 2019-11-27 MED ORDER — DOXYCYCLINE HYCLATE 100 MG PO CAPS: 100.0000 mg | ORAL_CAPSULE | Freq: Two times a day (BID) | ORAL | 0 refills | Status: DC

## 2019-11-27 NOTE — Progress Notes (Signed)
Virtual Visit via Video Note  I connected with Janet Richards on 11/27/19 at  9:30 AM EST by a video enabled telemedicine application and verified that I am speaking with the correct person using two identifiers. Location patient: home Location provider:  home office Persons participating in the virtual visit: patient, provider  I discussed the limitations of evaluation and management by telemedicine and the availability of in person appointments. The patient expressed understanding and agreed to proceed.  Chief Complaint  Patient presents with  . Follow-up    follow up from urgent care, yesterday c/o heart hurting, face swollen left ear irritated, pt states that she was dx with a ruptured eardrum in right ear. Patient taking ibuprofen and will start on antibiotics when pharmacy has it available either today or tomorrow. Ibuprofen seem to be helping with symptoms for now.      HPI: Janet Richards is a 59 y.o. female to f/u from UC visit yesterday. She was diagnosed with frontal sinusitis and given Rx for prednisone and doxycycline. She was previously seen at [facility] on 11/06/19 and treated for sinusitis with keflex and prednisone. Pt states this helped improve her syptoms but symptoms returns once meds were completed.  Today, pt states she has yet to pick up her Rx but will do so when the "pharmacy has it available". She has been taking ibuprofen which helps relieve symptoms.  + headache, nasal congestion, Rt ear pain. No fever, chills. Denies cough, SOB, sore throat, chest pain, dizziness.  Past Medical History:  Diagnosis Date  . Asthma   . Chronic pancreatitis ([place])   . Essential hypertension, benign   . Menstrual migraine   . Periodontitis   . Prediabetes 10/17/2014  . Primary osteoarthritis of both knees   . Sinusitis   . Urticaria     Past Surgical History:  Procedure Laterality Date  . TUBAL LIGATION  12/26/1987    Family History  Problem Relation Age of Onset  .  Hypertension Mother   . CAD Father        MI in his 18s  . Allergic rhinitis Father   . Urticaria Sister   . Colon cancer Sister   . Allergic rhinitis Son   . Asthma Son   . Angioedema Neg Hx   . Eczema Neg Hx   . Immunodeficiency Neg Hx   . Esophageal cancer Neg Hx   . Stomach cancer Neg Hx   . Rectal cancer Neg Hx     Social History   Tobacco Use  . Smoking status: Former Smoker    Packs/day: 1.50    Types: Cigarettes    Quit date: 02/05/2006    Years since quitting: 13.8  . Smokeless tobacco: Never Used  Substance Use Topics  . Alcohol use: Yes    Comment: rarely  . Drug use: No     Current Outpatient Medications:  .  cetirizine HCl (ZYRTEC) 1 MG/ML solution, TAKE 10 MLS (10 MG TOTAL) BY MOUTH DAILY., Disp: 944 mL, Rfl: 1 .  conjugated estrogens (PREMARIN) vaginal cream, Place 1 Applicatorful vaginally daily. For first 2 weeks then decrease to 3 times a week., Disp: 42.5 g, Rfl: 12 .  hydrochlorothiazide (HYDRODIURIL) 25 MG tablet, Take 1 tablet (25 mg total) by mouth daily., Disp: 90 tablet, Rfl: 3 .  montelukast (SINGULAIR) 10 MG tablet, Take 1 tablet once a day for coughing or wheezing., Disp: 90 tablet, Rfl: 3 .  Multiple Vitamins-Minerals (MULTIVITAMIN ADULT PO), Take by mouth.,  Disp: , Rfl:  .  pantoprazole (PROTONIX) 40 MG tablet, Take 1 tablet (40 mg total) by mouth daily., Disp: 60 tablet, Rfl: 3 .  triamcinolone (NASACORT) 55 MCG/ACT AERO nasal inhaler, Place 2 sprays into the nose daily., Disp: 1 Inhaler, Rfl: 6 .  cephALEXin (KEFLEX) 500 MG capsule, Take 1 capsule (500 mg total) by mouth 3 (three) times daily. (Patient not taking: Reported on 11/27/2019), Disp: 21 capsule, Rfl: 0 .  doxycycline (VIBRAMYCIN) 100 MG capsule, Take 1 capsule (100 mg total) by mouth 2 (two) times daily. (Patient not taking: Reported on 11/27/2019), Disp: 20 capsule, Rfl: 0 .  predniSONE (DELTASONE) 10 MG tablet, Take 4 tablets (40 mg total) by mouth daily for 5 days. (Patient not  taking: Reported on 11/27/2019), Disp: 20 tablet, Rfl: 0  Current Facility-Administered Medications:  .  0.9 %  sodium chloride infusion, 500 mL, Intravenous, Once, Szymon Foiles, Vito V, DO  Allergies  Allergen Reactions  . Azithromycin Nausea And Vomiting  . Clindamycin/Lincomycin Hives    Facial swelling  . Penicillins Swelling and Other (See Comments)    Patient states does not work for her.  She denies swelling or hives from penicillin. She states penicillin not effective for infections. Has taken cephalosporins without problems      ROS: See pertinent positives and negatives per HPI.   EXAM:  VITALS per patient if applicable: Temp 99991111 F (36.9 C) (Tympanic)   Ht 5\' 3"  (1.6 m)   LMP 09/24/2017   BMI 32.41 kg/m    GENERAL: alert, oriented, appears well and in no acute distress  HEENT: atraumatic, conjunctiva clear, no obvious abnormalities on inspection of external nose and ears  NECK: normal movements of the head and neck  LUNGS: on inspection no signs of respiratory distress, breathing rate appears normal, no obvious gross SOB, gasping or wheezing, no conversational dyspnea  CV: no obvious cyanosis  MS: moves all visible extremities without noticeable abnormality  PSYCH/NEURO: pleasant and cooperative, speech and thought processing grossly intact   ASSESSMENT AND PLAN:  1. Acute frontal sinusitis, recurrence not specified - agree with abx and prednisone as Rx'd at [facility] yesterday. Advised pt to pick up from pharmacy and take as directed - can add flonase, mucinex, nasal saline spray - cont with PRN tylenol or ibuprofen, rest, increased fluid intake - f/u if symptoms worsen or do not improve in 7-10 days.  - pt has ENT appt on Thursday   I discussed the assessment and treatment plan with the patient. The patient was provided an opportunity to ask questions and all were answered. The patient agreed with the plan and demonstrated an understanding of the  instructions.   The patient was advised to call back or seek an in-person evaluation if the symptoms worsen or if the condition fails to improve as anticipated.   Letta Median, DO

## 2019-12-02 ENCOUNTER — Encounter: Payer: Self-pay | Admitting: Family Medicine

## 2019-12-02 NOTE — Telephone Encounter (Signed)
FYI

## 2019-12-02 NOTE — Telephone Encounter (Signed)
I spoke with pt and informed her of Dr. Zigmund Daniel recommendations.   Pt said that she will go to Urgent care only if symptoms worsen.  Pt said that she tested negative for Covid, and that she has an appointment this Wednesday with ENT.

## 2019-12-02 NOTE — Telephone Encounter (Signed)
Sent to Dr. Zigmund Daniel in previous message.

## 2019-12-02 NOTE — Telephone Encounter (Signed)
Pt had a VV with Dr. Loletha Grayer on 11/27/19.  F.Y.I

## 2019-12-04 DIAGNOSIS — H9201 Otalgia, right ear: Secondary | ICD-10-CM | POA: Diagnosis not present

## 2019-12-04 DIAGNOSIS — R42 Dizziness and giddiness: Secondary | ICD-10-CM | POA: Diagnosis not present

## 2019-12-04 DIAGNOSIS — H6121 Impacted cerumen, right ear: Secondary | ICD-10-CM | POA: Diagnosis not present

## 2019-12-04 DIAGNOSIS — H93293 Other abnormal auditory perceptions, bilateral: Secondary | ICD-10-CM | POA: Diagnosis not present

## 2019-12-10 ENCOUNTER — Other Ambulatory Visit: Payer: Self-pay

## 2019-12-10 NOTE — Progress Notes (Signed)
Virtual Visit via Audio Note  I connected with patient on 12/11/19 at  3:15 PM EST by audio enabled telemedicine application and verified that I am speaking with the correct person using two identifiers.   THIS ENCOUNTER IS A VIRTUAL VISIT DUE TO COVID-19 - PATIENT WAS NOT SEEN IN THE OFFICE. PATIENT HAS CONSENTED TO VIRTUAL VISIT / TELEMEDICINE VISIT   Location of patient: home  Location of provider: office  I discussed the limitations of evaluation and management by telemedicine and the availability of in person appointments. The patient expressed understanding and agreed to proceed.   Subjective:   Janet Richards is a 59 y.o. female who presents for an Initial Medicare Annual Wellness Visit.  Review of Systems     Home Safety/Smoke Alarms: Feels safe in home. Smoke alarms in place.  Lives in 2 story home w/ 2 sons.   Female:   Pap- 10/23/19    Mammo-  08/28/19           CCS- pt reports last done 2014 w/ 10 yr recall     Objective:    Today's Vitals   12/11/19 1509  BP: 132/70  Pulse: 95  Temp: (!) 97 F (36.1 C)  TempSrc: Temporal  SpO2: 98%  Weight: 191 lb 12.8 oz (87 kg)  Height: 5\' 3"  (1.6 m)   Body mass index is 33.98 kg/m.  Advanced Directives 12/11/2019 09/05/2017 05/23/2016 04/01/2016 03/20/2014  Does Patient Have a Medical Advance Directive? Yes No No No Patient would like information;Patient has advance directive, copy not in chart  Type of Advance Directive [place];Living will - - - -  Does patient want to make changes to medical advance directive? No - Patient declined - - - -  Copy of [place] in Chart? Yes - validated most recent copy scanned in chart (See row information) - - - -  Would patient like information on creating a medical advance directive? - - - No - patient declined information -    Current Medications (verified) Outpatient Encounter Medications as of 12/11/2019  Medication Sig  . cetirizine HCl  (ZYRTEC) 1 MG/ML solution TAKE 10 MLS (10 MG TOTAL) BY MOUTH DAILY.  [name] conjugated estrogens (PREMARIN) vaginal cream Place 1 Applicatorful vaginally daily. For first 2 weeks then decrease to 3 times a week.  . hydrochlorothiazide (HYDRODIURIL) 25 MG tablet Take 1 tablet (25 mg total) by mouth daily.  . montelukast (SINGULAIR) 10 MG tablet Take 1 tablet once a day for coughing or wheezing.  . Multiple Vitamins-Minerals (MULTIVITAMIN ADULT PO) Take by mouth.  . pantoprazole (PROTONIX) 40 MG tablet Take 1 tablet (40 mg total) by mouth daily.  . [DISCONTINUED] doxycycline (VIBRAMYCIN) 100 MG capsule Take 1 capsule (100 mg total) by mouth 2 (two) times daily.  . [DISCONTINUED] predniSONE (DELTASONE) 20 MG tablet 3 tabs po x 3 days, 2 tabs po x 3 days, 1 tab po x 3 days, 1/2 tab po x 3 days  . [DISCONTINUED] triamcinolone (NASACORT) 55 MCG/ACT AERO nasal inhaler Place 2 sprays into the nose daily.   Facility-Administered Encounter Medications as of 12/11/2019  Medication  . 0.9 %  sodium chloride infusion    Allergies (verified) Azithromycin and Clindamycin/lincomycin   History: Past Medical History:  Diagnosis Date  . Asthma   . Chronic pancreatitis ([place])   . Essential hypertension, benign   . Menstrual migraine   . Periodontitis   . Prediabetes 10/17/2014  . Primary osteoarthritis of  both knees   . Sinusitis   . Urticaria    Past Surgical History:  Procedure Laterality Date  . TUBAL LIGATION  12/26/1987   Family History  Problem Relation Age of Onset  . Hypertension Mother   . CAD Father        MI in his 54s  . Allergic rhinitis Father   . Urticaria Sister   . Colon cancer Sister   . Allergic rhinitis Son   . Asthma Son   . Angioedema Neg Hx   . Eczema Neg Hx   . Immunodeficiency Neg Hx   . Esophageal cancer Neg Hx   . Stomach cancer Neg Hx   . Rectal cancer Neg Hx    Social History   Socioeconomic History  . Marital status: Married    Spouse name: Not on file  .  Number of children: 3  . Years of education: Not on file  . Highest education level: Not on file  Occupational History  . Occupation: homemaker  Tobacco Use  . Smoking status: Former Smoker    Packs/day: 1.50    Types: Cigarettes    Quit date: 02/05/2006    Years since quitting: 13.8  . Smokeless tobacco: Never Used  Substance and Sexual Activity  . Alcohol use: Yes    Comment: rarely  . Drug use: No  . Sexual activity: Yes    Partners: Male  Other Topics Concern  . Not on file  Social History Narrative  . Not on file   Social Determinants of Health   Financial Resource Strain: Low Risk   . Difficulty of Paying Living Expenses: Not hard at all  Food Insecurity: No Food Insecurity  . Worried About Charity fundraiser in the Last Year: Never true  . Ran Out of Food in the Last Year: Never true  Transportation Needs: No Transportation Needs  . Lack of Transportation (Medical): No  . Lack of Transportation (Non-Medical): No  Physical Activity:   . Days of Exercise per Week: Not on file  . Minutes of Exercise per Session: Not on file  Stress:   . Feeling of Stress : Not on file  Social Connections:   . Frequency of Communication with Friends and Family: Not on file  . Frequency of Social Gatherings with Friends and Family: Not on file  . Attends Religious Services: Not on file  . Active Member of Clubs or Organizations: Not on file  . Attends Archivist Meetings: Not on file  . Marital Status: Not on file    Tobacco Counseling Counseling given: Not Answered   Clinical Intake: Pain : No/denies pain    Activities of Daily Living In your present state of health, do you have any difficulty performing the following activities: 12/11/2019  Hearing? N  Vision? N  Difficulty concentrating or making decisions? N  Walking or climbing stairs? N  Dressing or bathing? N  Doing errands, shopping? N  Preparing Food and eating ? N  Using the Toilet? N  In the past  six months, have you accidently leaked urine? N  Do you have problems with loss of bowel control? N  Managing your Medications? N  Managing your Finances? N  Housekeeping or managing your Housekeeping? N  Some recent data might be hidden     Immunizations and Health Maintenance Immunization History  Administered Date(s) Administered  . Influenza,inj,Quad PF,6+ Mos 07/12/2016, 07/11/2017, 07/13/2018  . Influenza-Unspecified 07/13/2018, 08/05/2019  . Tdap 11/07/2010, 10/17/2014  There are no preventive care reminders to display for this patient.  Patient Care Team: Ronnald Nian, DO as PCP - General (Family Medicine)  Indicate any recent Medical Services you may have received from other than [facility] providers in the past year (date may be approximate).     Assessment:   This is a routine wellness examination for Janet Richards. Physical assessment deferred to PCP.  Hearing/Vision screen  Hearing Screening   125Hz  250Hz  500Hz  1000Hz  2000Hz  3000Hz  4000Hz  6000Hz  8000Hz   Right ear:           Left ear:           Comments: Declines and denies trouble hearing    Visual Acuity Screening   Right eye Left eye Both eyes  Without correction:     With correction: 20/20 20/20 20/20     Dietary issues and exercise activities discussed: Current Exercise Habits: Home exercise routine, Type of exercise: treadmill(treadmill), Time (Minutes): 10, Frequency (Times/Week): 5, Weekly Exercise (Minutes/Week): 50, Intensity: Mild, Exercise limited by: None identified Diet (meal preparation, eat out, water intake, caffeinated beverages, dairy products, fruits and vegetables): 24 hr recall Breakfast:cheerios Lunch: hot pocket Dinner:   Shrimp and egg rolls   Goals    . Increase physical activity      Depression Screen PHQ 2/9 Scores 12/11/2019 11/27/2019 10/25/2019 12/15/2017 03/27/2017  PHQ - 2 Score 0 0 0 0 0    Fall Risk Fall Risk  12/11/2019 11/27/2019  Falls in the past year? 0 0  Number falls  in past yr: 0 -  Injury with Fall? 0 -  Follow up Education provided;Falls prevention discussed -   Cognitive Function: Ad8 score reviewed for issues:  Issues making decisions:no  Less interest in hobbies / activities:no  Repeats questions, stories (family complaining):no  Trouble using ordinary gadgets (microwave, computer, phone):no  Forgets the month or year: no  Mismanaging finances: no  Remembering appts:no  Daily problems with thinking and/or memory:no Ad8 score is=0       Screening Tests Health Maintenance  Topic Date Due  . MAMMOGRAM  08/27/2021  . PAP SMEAR-Modifier  10/22/2022  . TETANUS/TDAP  10/17/2024  . COLONOSCOPY  03/19/2025  . INFLUENZA VACCINE  Completed  . Hepatitis C Screening  Completed  . HIV Screening  Completed      Plan:    Please schedule your next medicare wellness visit with me in 1 yr.  Continue to eat heart healthy diet (full of fruits, vegetables, whole grains, lean protein, water--limit salt, fat, and sugar intake) and increase physical activity as tolerated.  Continue doing brain stimulating activities (puzzles, reading, adult coloring books, staying active) to keep memory sharp.     I have personally reviewed and noted the following in the patient's chart:   . Medical and social history . Use of alcohol, tobacco or illicit drugs  . Current medications and supplements . Functional ability and status . Nutritional status . Physical activity . Advanced directives . List of other physicians . Hospitalizations, surgeries, and ER visits in previous 12 months . Vitals . Screenings to include cognitive, depression, and falls . Referrals and appointments  In addition, I have reviewed and discussed with patient certain preventive protocols, quality metrics, and best practice recommendations. A written personalized care plan for preventive services as well as general preventive health recommendations were provided to patient.      Naaman Plummer [place], [place]   12/11/2019

## 2019-12-11 ENCOUNTER — Encounter: Payer: Self-pay | Admitting: *Deleted

## 2019-12-11 ENCOUNTER — Ambulatory Visit: Payer: Medicare Other | Admitting: Family Medicine

## 2019-12-11 ENCOUNTER — Ambulatory Visit (INDEPENDENT_AMBULATORY_CARE_PROVIDER_SITE_OTHER): Payer: Medicare Other | Admitting: *Deleted

## 2019-12-11 VITALS — BP 132/70 | HR 95 | Temp 97.0°F | Ht 63.0 in | Wt 191.8 lb

## 2019-12-11 DIAGNOSIS — Z0000 Encounter for general adult medical examination without abnormal findings: Secondary | ICD-10-CM | POA: Diagnosis not present

## 2019-12-11 NOTE — Patient Instructions (Signed)
Please schedule your next medicare wellness visit with me in 1 yr.  Continue to eat heart healthy diet (full of fruits, vegetables, whole grains, lean protein, water--limit salt, fat, and sugar intake) and increase physical activity as tolerated.  Continue doing brain stimulating activities (puzzles, reading, adult coloring books, staying active) to keep memory sharp.    Janet Richards , Thank you for taking time to come for your Medicare Wellness Visit. I appreciate your ongoing commitment to your health goals. Please review the following plan we discussed and let me know if I can assist you in the future.   These are the goals we discussed: Goals    . Increase physical activity       This is a list of the screening recommended for you and due dates:  Health Maintenance  Topic Date Due  . Mammogram  08/27/2021  . Pap Smear  10/22/2022  . Tetanus Vaccine  10/17/2024  . Colon Cancer Screening  03/19/2025  . Flu Shot  Completed  .  Hepatitis C: One time screening is recommended by Center for Disease Control  (CDC) for  adults born from [birth year] through 1963.   Completed  . HIV Screening  Completed     Preventive Care 71-26 Years Old, Female Preventive care refers to visits with your health care provider and lifestyle choices that can promote health and wellness. This includes:  A yearly physical exam. This may also be called an annual well check.  Regular dental visits and eye exams.  Immunizations.  Screening for certain conditions.  Healthy lifestyle choices, such as eating a healthy diet, getting regular exercise, not using drugs or products that contain nicotine and tobacco, and limiting alcohol use. What can I expect for my preventive care visit? Physical exam Your health care provider will check your:  Height and weight. This may be used to calculate body mass index (BMI), which tells if you are at a healthy weight.  Heart rate and blood pressure.  Skin for abnormal  spots. Counseling Your health care provider may ask you questions about your:  Alcohol, tobacco, and drug use.  Emotional well-being.  Home and relationship well-being.  Sexual activity.  Eating habits.  Work and work Statistician.  Method of birth control.  Menstrual cycle.  Pregnancy history. What immunizations do I need?  Influenza (flu) vaccine  This is recommended every year. Tetanus, diphtheria, and pertussis (Tdap) vaccine  You may need a Td booster every 10 years. Varicella (chickenpox) vaccine  You may need this if you have not been vaccinated. Zoster (shingles) vaccine  You may need this after age 19. Measles, mumps, and rubella (MMR) vaccine  You may need at least one dose of MMR if you were born in 1957 or later. You may also need a second dose. Pneumococcal conjugate (PCV13) vaccine  You may need this if you have certain conditions and were not previously vaccinated. Pneumococcal polysaccharide (PPSV23) vaccine  You may need one or two doses if you smoke cigarettes or if you have certain conditions. Meningococcal conjugate (MenACWY) vaccine  You may need this if you have certain conditions. Hepatitis A vaccine  You may need this if you have certain conditions or if you travel or work in places where you may be exposed to hepatitis A. Hepatitis B vaccine  You may need this if you have certain conditions or if you travel or work in places where you may be exposed to hepatitis B. Haemophilus influenzae type b (Hib) vaccine  You may need this if you have certain conditions. Human papillomavirus (HPV) vaccine  If recommended by your health care provider, you may need three doses over 6 months. You may receive vaccines as individual doses or as more than one vaccine together in one shot (combination vaccines). Talk with your health care provider about the risks and benefits of combination vaccines. What tests do I need? Blood tests  Lipid and  cholesterol levels. These may be checked every 5 years, or more frequently if you are over 58 years old.  Hepatitis C test.  Hepatitis B test. Screening  Lung cancer screening. You may have this screening every year starting at age 50 if you have a 30-pack-year history of smoking and currently smoke or have quit within the past 15 years.  Colorectal cancer screening. All adults should have this screening starting at age 98 and continuing until age 35. Your health care provider may recommend screening at age 21 if you are at increased risk. You will have tests every 1-10 years, depending on your results and the type of screening test.  Diabetes screening. This is done by checking your blood sugar (glucose) after you have not eaten for a while (fasting). You may have this done every 1-3 years.  Mammogram. This may be done every 1-2 years. Talk with your health care provider about when you should start having regular mammograms. This may depend on whether you have a family history of breast cancer.  BRCA-related cancer screening. This may be done if you have a family history of breast, ovarian, tubal, or peritoneal cancers.  Pelvic exam and Pap test. This may be done every 3 years starting at age 18. Starting at age 73, this may be done every 5 years if you have a Pap test in combination with an HPV test. Other tests  Sexually transmitted disease (STD) testing.  Bone density scan. This is done to screen for osteoporosis. You may have this scan if you are at high risk for osteoporosis. Follow these instructions at home: Eating and drinking  Eat a diet that includes fresh fruits and vegetables, whole grains, lean protein, and low-fat dairy.  Take vitamin and mineral supplements as recommended by your health care provider.  Do not drink alcohol if: ? Your health care provider tells you not to drink. ? You are pregnant, may be pregnant, or are planning to become pregnant.  If you drink  alcohol: ? Limit how much you have to 0-1 drink a day. ? Be aware of how much alcohol is in your drink. In the [place] one drink equals one 12 oz bottle of beer (355 mL), one 5 oz glass of wine (148 mL), or one 1 oz glass of hard liquor (44 mL). Lifestyle  Take daily care of your teeth and gums.  Stay active. Exercise for at least 30 minutes on 5 or more days each week.  Do not use any products that contain nicotine or tobacco, such as cigarettes, e-cigarettes, and chewing tobacco. If you need help quitting, ask your health care provider.  If you are sexually active, practice safe sex. Use a condom or other form of birth control (contraception) in order to prevent pregnancy and STIs (sexually transmitted infections).  If told by your health care provider, take low-dose aspirin daily starting at age 57. What's next?  Visit your health care provider once a year for a well check visit.  Ask your health care provider how often you should have your eyes  and teeth checked.  Stay up to date on all vaccines. This information is not intended to replace advice given to you by your health care provider. Make sure you discuss any questions you have with your health care provider. Document Revised: 07/05/2018 Document Reviewed: 07/05/2018 Elsevier Patient Education  2020 [employer].

## 2019-12-17 ENCOUNTER — Other Ambulatory Visit: Payer: Self-pay

## 2019-12-18 ENCOUNTER — Ambulatory Visit: Payer: Medicare Other | Admitting: Family Medicine

## 2019-12-18 ENCOUNTER — Encounter: Payer: Self-pay | Admitting: Family Medicine

## 2019-12-18 ENCOUNTER — Ambulatory Visit (INDEPENDENT_AMBULATORY_CARE_PROVIDER_SITE_OTHER): Payer: Medicare Other | Admitting: Family Medicine

## 2019-12-18 VITALS — BP 126/90 | HR 101 | Temp 96.9°F | Ht 63.0 in | Wt 194.6 lb

## 2019-12-18 DIAGNOSIS — G4452 New daily persistent headache (NDPH): Secondary | ICD-10-CM | POA: Diagnosis not present

## 2019-12-18 DIAGNOSIS — R3915 Urgency of urination: Secondary | ICD-10-CM

## 2019-12-18 DIAGNOSIS — M940 Chondrocostal junction syndrome [Tietze]: Secondary | ICD-10-CM | POA: Diagnosis not present

## 2019-12-18 DIAGNOSIS — R350 Frequency of micturition: Secondary | ICD-10-CM

## 2019-12-18 LAB — POCT URINALYSIS DIPSTICK
Bilirubin, UA: NEGATIVE
Blood, UA: NEGATIVE
Glucose, UA: NEGATIVE
Ketones, UA: NEGATIVE
Leukocytes, UA: NEGATIVE
Nitrite, UA: NEGATIVE
Protein, UA: NEGATIVE
Spec Grav, UA: 1.01
Urobilinogen, UA: 0.2 U/dL
pH, UA: 6

## 2019-12-18 MED ORDER — SUMATRIPTAN SUCCINATE 50 MG PO TABS: 50.0000 mg | ORAL_TABLET | ORAL | 0 refills | Status: DC | PRN

## 2019-12-18 MED ORDER — OXYBUTYNIN CHLORIDE ER 5 MG PO TB24: 5.0000 mg | ORAL_TABLET | Freq: Every day | ORAL | 3 refills | Status: DC

## 2019-12-18 MED ORDER — IBUPROFEN 800 MG PO TABS: 800.0000 mg | ORAL_TABLET | Freq: Three times a day (TID) | ORAL | 0 refills | Status: DC | PRN

## 2019-12-18 NOTE — Progress Notes (Signed)
Janet Richards is a 59 y.o. female  Chief Complaint  Patient presents with  . Chest Pain  . Headache    HPI: Janet Richards is a 59 y.o. female  1. Frontal headache x 1 mo. Headache is daily but not constant. No associated symptoms - no lightheadedness, dizziness, vision changes, n/v. + intermittent runny nose and nasal congestion - happens about 1x/wk and pt has a h/o allergies.  She does feel she needs an eye exam because her vision has declined in the past year and she often squints to see.  She tried taking children's liquid ibuprofen ("so it gets in [me] faster") and aspirin (5 81mg  tab per day).  She started taking doxycycline 100mg  BID yesterday (she had this from previous Rx) She is concerned about an aneurysm d/t husband and ? Father both with aneurysm.  2. Chest pain Lt sided x almost 1 year. She feels pain is worse when she takes her bra off so she has been sleeping in her bra x 4-5 mo. No change with movement. No associated symptoms.  Last mammo in 08/2019 - normal.  Pt believes she had COVID in 12/2018 "before they knew what is was" She has been seen/eval in ER for this - normal/negative eval.  3. Increased urinary urgency, frequency x few weeks. No dysuria. No gross hematuria. She has had a few episodes of incontinence.   She is on HCTZ 25mg  daily and has been for years.  Denies increased thirst, fever, chills, n/v.   Past Medical History:  Diagnosis Date  . Asthma   . Chronic pancreatitis ([place])   . Essential hypertension, benign   . Menstrual migraine   . Periodontitis   . Prediabetes 10/17/2014  . Primary osteoarthritis of both knees   . Sinusitis   . Urticaria     Past Surgical History:  Procedure Laterality Date  . TUBAL LIGATION  12/26/1987    Social History   Socioeconomic History  . Marital status: Married    Spouse name: Not on file  . Number of children: 3  . Years of education: Not on file  . Highest education level: Not on file    Occupational History  . Occupation: homemaker  Tobacco Use  . Smoking status: Former Smoker    Packs/day: 1.50    Types: Cigarettes    Quit date: 02/05/2006    Years since quitting: 13.8  . Smokeless tobacco: Never Used  Substance and Sexual Activity  . Alcohol use: Yes    Comment: rarely  . Drug use: No  . Sexual activity: Yes    Partners: Male  Other Topics Concern  . Not on file  Social History Narrative  . Not on file   Social Determinants of Health   Financial Resource Strain: Low Risk   . Difficulty of Paying Living Expenses: Not hard at all  Food Insecurity: No Food Insecurity  . Worried About Charity fundraiser in the Last Year: Never true  . Ran Out of Food in the Last Year: Never true  Transportation Needs: No Transportation Needs  . Lack of Transportation (Medical): No  . Lack of Transportation (Non-Medical): No  Physical Activity:   . Days of Exercise per Week: Not on file  . Minutes of Exercise per Session: Not on file  Stress:   . Feeling of Stress : Not on file  Social Connections:   . Frequency of Communication with Friends and Family: Not on file  . Frequency  of Social Gatherings with Friends and Family: Not on file  . Attends Religious Services: Not on file  . Active Member of Clubs or Organizations: Not on file  . Attends Archivist Meetings: Not on file  . Marital Status: Not on file  Intimate Partner Violence:   . Fear of Current or Ex-Partner: Not on file  . Emotionally Abused: Not on file  . Physically Abused: Not on file  . Sexually Abused: Not on file    Family History  Problem Relation Age of Onset  . Hypertension Mother   . CAD Father        MI in his 51s  . Allergic rhinitis Father   . Urticaria Sister   . Colon cancer Sister   . Allergic rhinitis Son   . Asthma Son   . Angioedema Neg Hx   . Eczema Neg Hx   . Immunodeficiency Neg Hx   . Esophageal cancer Neg Hx   . Stomach cancer Neg Hx   . Rectal cancer Neg Hx       Immunization History  Administered Date(s) Administered  . Influenza,inj,Quad PF,6+ Mos 07/12/2016, 07/11/2017, 07/13/2018  . Influenza-Unspecified 07/13/2018, 08/05/2019  . Tdap 11/07/2010, 10/17/2014    Outpatient Encounter Medications as of 12/18/2019  Medication Sig  . aspirin EC 81 MG tablet Take 81 mg by mouth daily. Pt said that she is taking 5 a day  . cetirizine HCl (ZYRTEC) 1 MG/ML solution TAKE 10 MLS (10 MG TOTAL) BY MOUTH DAILY.  [name] conjugated estrogens (PREMARIN) vaginal cream Place 1 Applicatorful vaginally daily. For first 2 weeks then decrease to 3 times a week.  . hydrochlorothiazide (HYDRODIURIL) 25 MG tablet Take 1 tablet (25 mg total) by mouth daily.  . montelukast (SINGULAIR) 10 MG tablet Take 1 tablet once a day for coughing or wheezing.  . Multiple Vitamins-Minerals (MULTIVITAMIN ADULT PO) Take by mouth.  . pantoprazole (PROTONIX) 40 MG tablet Take 1 tablet (40 mg total) by mouth daily.   Facility-Administered Encounter Medications as of 12/18/2019  Medication  . 0.9 %  sodium chloride infusion     ROS: Pertinent positives and negatives noted in HPI. Remainder of ROS non-contributory    Allergies  Allergen Reactions  . Azithromycin Nausea And Vomiting  . Clindamycin/Lincomycin Hives    Facial swelling    BP 126/90 (BP Location: Left Arm, Patient Position: Sitting, Cuff Size: Large)   Pulse (!) 101   Temp (!) 96.9 F (36.1 C) (Temporal)   Ht 5\' 3"  (1.6 m)   Wt 194 lb 9.6 oz (88.3 kg)   LMP 09/24/2017   SpO2 97%   BMI 34.47 kg/m   BP Readings from Last 3 Encounters:  12/18/19 126/90  12/11/19 132/70  11/26/19 129/82   Wt Readings from Last 3 Encounters:  12/18/19 194 lb 9.6 oz (88.3 kg)  12/11/19 191 lb 12.8 oz (87 kg)  11/26/19 182 lb 15.7 oz (83 kg)    Physical Exam  Constitutional: She is oriented to person, place, and time. She appears well-developed and well-nourished. No distress.  HENT:  Head: Normocephalic and atraumatic.   Right Ear: Tympanic membrane and ear canal normal.  Left Ear: Tympanic membrane and ear canal normal.  Nose: Nose normal. Right sinus exhibits no maxillary sinus tenderness and no frontal sinus tenderness. Left sinus exhibits no maxillary sinus tenderness and no frontal sinus tenderness.  Mouth/Throat: Oropharynx is clear and moist and mucous membranes are normal.  Cardiovascular: Normal rate, regular rhythm and  normal heart sounds.  Pulmonary/Chest: Effort normal and breath sounds normal. She has no wheezes. She has no rhonchi. She has no rales. She exhibits tenderness ( TTP over sternum and to Lt of sternum).  Neurological: She is alert and oriented to person, place, and time.  Psychiatric: She has a normal mood and affect. Her speech is tangential.     A/P:  1. Costochondritis - ice BID Rx: - ibuprofen (ADVIL) 800 MG tablet; Take 1 tablet (800 mg total) by mouth every 8 (eight) hours as needed.  Dispense: 30 tablet; Refill: 0 - pt to take BID w/ food x 7-10 days then stop - avoid heavy lifting, pulling, pushing, etc x 2 wks - f/u in 3-4 wks if symptoms do not improve  2. Urinary frequency 3. Urinary urgency - oxybutynin (DITROPAN-XL) 5 MG 24 hr tablet; Take 1 tablet (5 mg total) by mouth at bedtime.  Dispense: 90 tablet; Refill: 3 - POCT Urinalysis Dipstick - normal/negative Discussed plan and reviewed medications with patient, including risks, benefits, and potential side effects. Pt expressed understand. All questions answered.  4. New daily persistent headache - strongly recommend pt have eye/vision exam as vision issue could certainly be contributing to, if not causing, headaches - pt also changed hairstyle to small, very tight braids just prior to symptoms starting so this may be causing/contributing to symptoms. Consider different hair style to see if this helps relieve headache Rx: - SUMAtriptan (IMITREX) 50 MG tablet; Take 1 tablet (50 mg total) by mouth every 2 (two) hours  as needed for migraine. May repeat in 2 hours if headache persists or recurs.  Dispense: 10 tablet; Refill: 0 - CT Head Wo Contrast; Future - pt is very concerned about aneurysm or tumor and is not easily reassured - cont with increased water intake, ibuprofen PRN Discussed plan and reviewed medications with patient, including risks, benefits, and potential side effects. Pt expressed understand. All questions answered.    This visit occurred during the SARS-CoV-2 public health emergency.  Safety protocols were in place, including screening questions prior to the visit, additional usage of staff PPE, and extensive cleaning of exam room while observing appropriate contact time as indicated for disinfecting solutions.

## 2019-12-18 NOTE — Patient Instructions (Addendum)
There are no preventive care reminders to display for this patient.  Depression screen [facility] 2/9 12/11/2019 11/27/2019 10/25/2019  Decreased Interest 0 0 0  Down, Depressed, Hopeless 0 0 0  PHQ - 2 Score 0 0 0  Some recent data might be hidden   Get vision exam Try sumatriptan CT head - [facility] [place], [place], [place] [address] Phone: [phone]  [facility] [address] [place], [place], [place] [address] Phone: [phone]    Start oxybutynin 5mg  for urinary symptoms  Ice 2x/day Ibuprofen 800mg  2x/day with breakfast and dinner x 7 days   Costochondritis  Costochondritis is swelling and irritation (inflammation) of the tissue (cartilage) that connects your ribs to your breastbone (sternum). This causes pain in the front of your chest. The pain usually starts gradually and involves more than one rib. What are the causes? The exact cause of this condition is not always known. It results from stress on the cartilage where your ribs attach to your sternum. The cause of this stress could be:  Chest injury (trauma).  Exercise or activity, such as lifting.  Severe coughing. What increases the risk? You may be at higher risk for this condition if you:  Are female.  Are 54?57 years old.  Recently started a new exercise or work activity.  Have low levels of vitamin D.  Have a condition that makes you cough frequently. What are the signs or symptoms? The main symptom of this condition is chest pain. The pain:  Usually starts gradually and can be sharp or dull.  Gets worse with deep breathing, coughing, or exercise.  Gets better with rest.  May be worse when you press on the sternum-rib connection (tenderness). How is this diagnosed? This condition is diagnosed based on your symptoms, medical history, and a physical exam. Your health care provider will check for tenderness when pressing on your sternum. This is the most important  finding. You may also have tests to rule out other causes of chest pain. These may include:  A chest X-ray to check for lung problems.  An electrocardiogram (ECG) to see if you have a heart problem that could be causing the pain.  An imaging scan to rule out a chest or rib fracture. How is this treated?  This condition usually goes away on its own over time. Your health care provider may prescribe an NSAID to reduce pain and inflammation. Your health care provider may also suggest that you:  Rest and avoid activities that make pain worse.  Apply heat or cold to the area to reduce pain and inflammation.  Do exercises to stretch your chest muscles. If these treatments do not help, your health care provider may inject a numbing medicine at the sternum-rib connection to help relieve the pain. Follow these instructions at home:  Avoid activities that make pain worse. This includes any activities that use chest, abdominal, and side muscles.  If directed, put ice on the painful area: ? Put ice in a plastic bag. ? Place a towel between your skin and the bag. ? Leave the ice on for 20 minutes, 2-3 times a day.  If directed, apply heat to the affected area as often as told by your health care provider. Use the heat source that your health care provider recommends, such as a moist heat pack or a heating pad. ? Place a towel between your skin and the heat source. ? Leave the heat  on for 20-30 minutes. ? Remove the heat if your skin turns bright red. This is especially important if you are unable to feel pain, heat, or cold. You may have a greater risk of getting burned.  Take over-the-counter and prescription medicines only as told by your health care provider.  Return to your normal activities as told by your health care provider. Ask your health care provider what activities are safe for you.  Keep all follow-up visits as told by your health care provider. This is important. Contact a health  care provider if:  You have chills or a fever.  Your pain does not go away or it gets worse.  You have a cough that does not go away (is persistent). Get help right away if:  You have shortness of breath. This information is not intended to replace advice given to you by your health care provider. Make sure you discuss any questions you have with your health care provider. Document Revised: 11/08/2017 Document Reviewed: 02/17/2016 Elsevier Patient Education  2020 [employer].

## 2019-12-20 ENCOUNTER — Encounter: Payer: Self-pay | Admitting: Family Medicine

## 2020-01-01 ENCOUNTER — Encounter: Payer: Self-pay | Admitting: Family Medicine

## 2020-01-02 ENCOUNTER — Other Ambulatory Visit: Payer: Self-pay | Admitting: Family Medicine

## 2020-01-02 NOTE — Telephone Encounter (Signed)
Brayton Layman is aware and is looking into it for pt.

## 2020-01-02 NOTE — Telephone Encounter (Signed)
Last fill 05/15/19  #942ml/1 Last OV 12/20/19

## 2020-01-07 NOTE — Telephone Encounter (Signed)
Please see message and advise.  Thank you. ° °

## 2020-01-11 ENCOUNTER — Encounter: Payer: Self-pay | Admitting: Family Medicine

## 2020-01-11 ENCOUNTER — Other Ambulatory Visit: Payer: Self-pay | Admitting: Physician Assistant

## 2020-01-11 DIAGNOSIS — N951 Menopausal and female climacteric states: Secondary | ICD-10-CM

## 2020-01-13 ENCOUNTER — Telehealth: Payer: Self-pay

## 2020-01-13 ENCOUNTER — Encounter: Payer: Self-pay | Admitting: Family Medicine

## 2020-01-14 ENCOUNTER — Other Ambulatory Visit: Payer: Self-pay | Admitting: Neurology

## 2020-01-14 DIAGNOSIS — N951 Menopausal and female climacteric states: Secondary | ICD-10-CM

## 2020-01-14 NOTE — Telephone Encounter (Signed)
error 

## 2020-01-15 NOTE — Telephone Encounter (Signed)
Spoke with patient regarding the CT exam. Authorization was not required for the exam per the website for [facility]. [facility] does not require auth either. I have updated the referral & notified [facility] so that pt may be scheduled if she chooses. I informed Ms. Melchert she is able to schedule as well. Unfortunately in this case there were several instances of service failure. The patient is changing to a physician in the [place] and she said that she is sorry to leave Dr. Bryan Lemma as she feels the office lied to her on multiple occasions. She states in addition to the [place] she has called the office 3x. 1x with the [facility] insurance rep and 1x with the [facility] [facility] Rep.

## 2020-01-19 DIAGNOSIS — N979 Female infertility, unspecified: Secondary | ICD-10-CM | POA: Insufficient documentation

## 2020-01-19 DIAGNOSIS — E6609 Other obesity due to excess calories: Secondary | ICD-10-CM

## 2020-01-19 DIAGNOSIS — E66811 Obesity, class 1: Secondary | ICD-10-CM

## 2020-01-19 HISTORY — DX: Other obesity due to excess calories: E66.09

## 2020-01-19 HISTORY — DX: Obesity, class 1: E66.811

## 2020-01-21 DIAGNOSIS — J029 Acute pharyngitis, unspecified: Secondary | ICD-10-CM | POA: Diagnosis not present

## 2020-01-21 DIAGNOSIS — Z780 Asymptomatic menopausal state: Secondary | ICD-10-CM | POA: Diagnosis not present

## 2020-01-21 DIAGNOSIS — Z1211 Encounter for screening for malignant neoplasm of colon: Secondary | ICD-10-CM | POA: Diagnosis not present

## 2020-01-21 DIAGNOSIS — J4520 Mild intermittent asthma, uncomplicated: Secondary | ICD-10-CM | POA: Diagnosis not present

## 2020-01-21 DIAGNOSIS — E782 Mixed hyperlipidemia: Secondary | ICD-10-CM | POA: Diagnosis not present

## 2020-01-21 DIAGNOSIS — Z86010 Personal history of colonic polyps: Secondary | ICD-10-CM | POA: Diagnosis not present

## 2020-01-21 DIAGNOSIS — R7303 Prediabetes: Secondary | ICD-10-CM | POA: Diagnosis not present

## 2020-01-21 DIAGNOSIS — I10 Essential (primary) hypertension: Secondary | ICD-10-CM | POA: Diagnosis not present

## 2020-01-21 DIAGNOSIS — Z23 Encounter for immunization: Secondary | ICD-10-CM | POA: Diagnosis not present

## 2020-01-21 DIAGNOSIS — R300 Dysuria: Secondary | ICD-10-CM | POA: Diagnosis not present

## 2020-01-22 ENCOUNTER — Encounter: Payer: Self-pay | Admitting: Family Medicine

## 2020-01-23 ENCOUNTER — Encounter: Payer: Self-pay | Admitting: Family Medicine

## 2020-01-23 ENCOUNTER — Other Ambulatory Visit: Payer: Self-pay

## 2020-01-23 ENCOUNTER — Ambulatory Visit (INDEPENDENT_AMBULATORY_CARE_PROVIDER_SITE_OTHER): Payer: Medicare Other

## 2020-01-23 DIAGNOSIS — G4452 New daily persistent headache (NDPH): Secondary | ICD-10-CM

## 2020-01-23 DIAGNOSIS — R519 Headache, unspecified: Secondary | ICD-10-CM | POA: Diagnosis not present

## 2020-01-29 ENCOUNTER — Encounter: Payer: Self-pay | Admitting: Family Medicine

## 2020-02-18 ENCOUNTER — Ambulatory Visit (INDEPENDENT_AMBULATORY_CARE_PROVIDER_SITE_OTHER): Payer: Medicare Other | Admitting: Family Medicine

## 2020-02-18 ENCOUNTER — Encounter: Payer: Self-pay | Admitting: Family Medicine

## 2020-02-18 DIAGNOSIS — I10 Essential (primary) hypertension: Secondary | ICD-10-CM | POA: Diagnosis not present

## 2020-02-18 DIAGNOSIS — K219 Gastro-esophageal reflux disease without esophagitis: Secondary | ICD-10-CM

## 2020-02-18 DIAGNOSIS — R519 Headache, unspecified: Secondary | ICD-10-CM | POA: Insufficient documentation

## 2020-02-18 HISTORY — DX: Headache, unspecified: R51.9

## 2020-02-18 HISTORY — DX: Gastro-esophageal reflux disease without esophagitis: K21.9

## 2020-02-18 NOTE — Patient Instructions (Addendum)
Great to see you! Please continue current medications and see me again in 4 months or sooner if needed.

## 2020-02-18 NOTE — Assessment & Plan Note (Signed)
Recent CT head negative.  Improved at this point.   Has imitrex as needed.

## 2020-02-18 NOTE — Assessment & Plan Note (Signed)
Stable with current dose of protonix.

## 2020-02-18 NOTE — Progress Notes (Signed)
Janet Richards - 59 y.o. female MRN [id number]  Date of birth: 1961-06-19  Subjective Chief Complaint  Patient presents with  . Establish Care    HPI Janet Richards is a 59 y.o. female with history of HTN and  GERD here today to establish care.  She has no new complaints today.  Has had some issues with headaches recently and previous PCP had ordered CT of head.  This returned negative.  These have improved and she is taking imitrex as needed.  She also thinks that allergies may have contributed to this.   Her BP is managed with HCTZ.  She is doing well with this and denies side effects.  She has had some occasional palpitations but denies chest pain, shortness of breath, or vision changes.   Jerrye Bushy is well controlled with protonix.    ROS:  A comprehensive ROS was completed and negative except as noted per HPI  Allergies  Allergen Reactions  . Azithromycin Nausea And Vomiting  . Clindamycin/Lincomycin Hives    Facial swelling    Past Medical History:  Diagnosis Date  . Asthma   . Chronic pancreatitis ([place])   . Essential hypertension, benign   . Menstrual migraine   . Periodontitis   . Prediabetes 10/17/2014  . Primary osteoarthritis of both knees   . Sinusitis   . Urticaria     Past Surgical History:  Procedure Laterality Date  . TUBAL LIGATION  12/26/1987    Social History   Socioeconomic History  . Marital status: Married    Spouse name: Not on file  . Number of children: 3  . Years of education: Not on file  . Highest education level: Not on file  Occupational History  . Occupation: homemaker  Tobacco Use  . Smoking status: Former Smoker    Packs/day: 1.50    Types: Cigarettes    Quit date: 02/05/2006    Years since quitting: 14.0  . Smokeless tobacco: Never Used  Substance and Sexual Activity  . Alcohol use: Yes    Comment: rarely  . Drug use: No  . Sexual activity: Yes    Partners: Male  Other Topics Concern  . Not on file  Social History  Narrative  . Not on file   Social Determinants of Health   Financial Resource Strain: Low Risk   . Difficulty of Paying Living Expenses: Not hard at all  Food Insecurity: No Food Insecurity  . Worried About Charity fundraiser in the Last Year: Never true  . Ran Out of Food in the Last Year: Never true  Transportation Needs: No Transportation Needs  . Lack of Transportation (Medical): No  . Lack of Transportation (Non-Medical): No  Physical Activity:   . Days of Exercise per Week:   . Minutes of Exercise per Session:   Stress:   . Feeling of Stress :   Social Connections:   . Frequency of Communication with Friends and Family:   . Frequency of Social Gatherings with Friends and Family:   . Attends Religious Services:   . Active Member of Clubs or Organizations:   . Attends Archivist Meetings:   [name] Marital Status:     Family History  Problem Relation Age of Onset  . Hypertension Mother   . CAD Father        MI in his 54s  . Allergic rhinitis Father   . Urticaria Sister   . Colon cancer Sister   . Allergic rhinitis  Son   . Asthma Son   . Angioedema Neg Hx   . Eczema Neg Hx   . Immunodeficiency Neg Hx   . Esophageal cancer Neg Hx   . Stomach cancer Neg Hx   . Rectal cancer Neg Hx     Health Maintenance  Topic Date Due  . INFLUENZA VACCINE  06/07/2020  . MAMMOGRAM  08/27/2021  . PAP SMEAR-Modifier  10/22/2022  . TETANUS/TDAP  10/17/2024  . COLONOSCOPY  03/19/2025  . Hepatitis C Screening  Completed  . HIV Screening  Completed     ----------------------------------------------------------------------------------------------------------------------------------------------------------------------------------------------------------------- Physical Exam BP 129/63   Pulse 89   Ht 5\' 3"  (1.6 m)   Wt 192 lb (87.1 kg)   LMP 09/24/2017   BMI 34.01 kg/m   Physical Exam Constitutional:      Appearance: Normal appearance.  HENT:     Head: Normocephalic  and atraumatic.  Eyes:     General: No scleral icterus. Cardiovascular:     Rate and Rhythm: Normal rate and regular rhythm.  Pulmonary:     Effort: Pulmonary effort is normal.     Breath sounds: Normal breath sounds.  Musculoskeletal:     Cervical back: Neck supple.  Neurological:     General: No focal deficit present.     Mental Status: She is alert.  Psychiatric:        Mood and Affect: Mood normal.        Behavior: Behavior normal.     ------------------------------------------------------------------------------------------------------------------------------------------------------------------------------------------------------------------- Assessment and Plan  Essential hypertension Blood pressure is at goal at for age and co-morbidities.  I recommend that she continue HCTZ at current dose.  In addition they were instructed to follow a low sodium diet with regular exercise to help to maintain adequate control of blood pressure.    Generalized headaches Recent CT head negative.  Improved at this point.   Has imitrex as needed.   GERD (gastroesophageal reflux disease) Stable with current dose of protonix.    No orders of the defined types were placed in this encounter.   Return in about 4 months (around 06/19/2020) for HTN.    This visit occurred during the SARS-CoV-2 public health emergency.  Safety protocols were in place, including screening questions prior to the visit, additional usage of staff PPE, and extensive cleaning of exam room while observing appropriate contact time as indicated for disinfecting solutions.

## 2020-02-18 NOTE — Assessment & Plan Note (Signed)
Blood pressure is at goal at for age and co-morbidities.  I recommend that she continue HCTZ at current dose.  In addition they were instructed to follow a low sodium diet with regular exercise to help to maintain adequate control of blood pressure.

## 2020-02-19 ENCOUNTER — Ambulatory Visit: Payer: Medicare Other | Admitting: Family Medicine

## 2020-02-27 ENCOUNTER — Other Ambulatory Visit: Payer: Self-pay | Admitting: Family Medicine

## 2020-02-27 MED ORDER — CETIRIZINE HCL 1 MG/ML PO SOLN: ORAL | 1 refills | Status: DC

## 2020-03-02 NOTE — Telephone Encounter (Signed)
CM-Plz see refill req/thx dmf 

## 2020-03-03 ENCOUNTER — Other Ambulatory Visit: Payer: Self-pay | Admitting: Family Medicine

## 2020-03-03 MED ORDER — CETIRIZINE HCL 1 MG/ML PO SOLN: ORAL | 0 refills | Status: DC

## 2020-03-09 ENCOUNTER — Encounter: Payer: Self-pay | Admitting: Family Medicine

## 2020-03-09 ENCOUNTER — Ambulatory Visit (INDEPENDENT_AMBULATORY_CARE_PROVIDER_SITE_OTHER): Payer: Medicare Other | Admitting: Family Medicine

## 2020-03-09 ENCOUNTER — Other Ambulatory Visit: Payer: Self-pay

## 2020-03-09 VITALS — BP 135/75 | HR 89 | Temp 98.6°F | Ht 62.99 in | Wt 193.3 lb

## 2020-03-09 DIAGNOSIS — R002 Palpitations: Secondary | ICD-10-CM

## 2020-03-09 DIAGNOSIS — H938X3 Other specified disorders of ear, bilateral: Secondary | ICD-10-CM

## 2020-03-09 HISTORY — DX: Other specified disorders of ear, bilateral: H93.8X3

## 2020-03-09 HISTORY — DX: Palpitations: R00.2

## 2020-03-09 MED ORDER — NEOMYCIN-POLYMYXIN-HC 3.5-10000-1 OT SOLN: 4.0000 [drp] | Freq: Four times a day (QID) | OTIC | 0 refills | Status: DC

## 2020-03-09 NOTE — Assessment & Plan Note (Signed)
Bilateral erythema and flaking, appearance consistent with eczema with possible superinfection.  Will provide rx for cortisporin drops.

## 2020-03-09 NOTE — Assessment & Plan Note (Signed)
Has had normal EKGs previously Seems to be related to episodes of GERD, possibly due to esophageal spasm.  Will get Echocardiogram to evaluate for any structural abnormalities that may be contributing to palpitations. Consider holter monitor if this persists.

## 2020-03-09 NOTE — Patient Instructions (Signed)
Nice to see you today! Use drops in both ears.  Let me know if not improving.  You should receive a call to arrange to ultrasound of your heart.

## 2020-03-09 NOTE — Progress Notes (Signed)
Janet Richards - 59 y.o. female MRN [id number]  Date of birth: 1961/10/02  Subjective No chief complaint on file.   HPI Janet Richards is a 59 y.o. female here today with complaint of ear irritation and palpitations.  She has had recurrent issues with ears in the past.  Describes current symptoms as itching and irritation of the ears.  Has been using debrox drops as she thought it was wax.  She denies fever, chills, sinus pain, ear pain, dizziness or changes to hearing.   She also has had palpitations. This has been going on for about 1 year.   Seen in ED previously and had EKG completed showing NSR without significant change from prior EKG.  Symptoms typically are associated with her GERD.  She denies chest pain, dizziness or shortness of breath.    ROS:  A comprehensive ROS was completed and negative except as noted per HPI  Allergies  Allergen Reactions  . Azithromycin Nausea And Vomiting  . Clindamycin/Lincomycin Hives    Facial swelling    Past Medical History:  Diagnosis Date  . Asthma   . Chronic pancreatitis ([place])   . Essential hypertension, benign   . Menstrual migraine   . Periodontitis   . Prediabetes 10/17/2014  . Primary osteoarthritis of both knees   . Sinusitis   . Urticaria     Past Surgical History:  Procedure Laterality Date  . TUBAL LIGATION  12/26/1987    Social History   Socioeconomic History  . Marital status: Married    Spouse name: Not on file  . Number of children: 3  . Years of education: Not on file  . Highest education level: Not on file  Occupational History  . Occupation: homemaker  Tobacco Use  . Smoking status: Former Smoker    Packs/day: 1.50    Types: Cigarettes    Quit date: 02/05/2006    Years since quitting: 14.0  . Smokeless tobacco: Never Used  Substance and Sexual Activity  . Alcohol use: Yes    Comment: rarely  . Drug use: No  . Sexual activity: Yes    Partners: Male  Other Topics Concern  . Not on file  Social  History Narrative  . Not on file   Social Determinants of Health   Financial Resource Strain: Low Risk   . Difficulty of Paying Living Expenses: Not hard at all  Food Insecurity: No Food Insecurity  . Worried About Charity fundraiser in the Last Year: Never true  . Ran Out of Food in the Last Year: Never true  Transportation Needs: No Transportation Needs  . Lack of Transportation (Medical): No  . Lack of Transportation (Non-Medical): No  Physical Activity:   . Days of Exercise per Week:   . Minutes of Exercise per Session:   Stress:   . Feeling of Stress :   Social Connections:   . Frequency of Communication with Friends and Family:   . Frequency of Social Gatherings with Friends and Family:   . Attends Religious Services:   . Active Member of Clubs or Organizations:   . Attends Archivist Meetings:   [name] Marital Status:     Family History  Problem Relation Age of Onset  . Hypertension Mother   . CAD Father        MI in his 48s  . Allergic rhinitis Father   . Urticaria Sister   . Colon cancer Sister   . Allergic rhinitis Son   .  Asthma Son   . Angioedema Neg Hx   . Eczema Neg Hx   . Immunodeficiency Neg Hx   . Esophageal cancer Neg Hx   . Stomach cancer Neg Hx   . Rectal cancer Neg Hx     Health Maintenance  Topic Date Due  . COVID-19 Vaccine (1) Never done  . INFLUENZA VACCINE  06/07/2020  . MAMMOGRAM  08/27/2021  . PAP SMEAR-Modifier  10/22/2022  . TETANUS/TDAP  10/17/2024  . COLONOSCOPY  03/19/2025  . Hepatitis C Screening  Completed  . HIV Screening  Completed     ----------------------------------------------------------------------------------------------------------------------------------------------------------------------------------------------------------------- Physical Exam BP 135/75 (BP Location: Left Arm, Patient Position: Sitting, Cuff Size: Normal)   Pulse 89   Temp 98.6 F (37 C) (Oral)   Ht 5' 2.99" (1.6 m)   Wt 193 lb 4.8  oz (87.7 kg)   LMP 09/24/2017   SpO2 99%   BMI 34.25 kg/m   Physical Exam Constitutional:      Appearance: Normal appearance.  HENT:     Head: Normocephalic and atraumatic.     Ears:     Comments: Canals with bilateral erythema and flaking.  No drainage.  TM's normal bilaterally.  Eyes:     General: No scleral icterus. Cardiovascular:     Rate and Rhythm: Normal rate and regular rhythm.     Heart sounds: Normal heart sounds.  Pulmonary:     Effort: Pulmonary effort is normal.     Breath sounds: Normal breath sounds.  Musculoskeletal:     Cervical back: Neck supple.  Skin:    General: Skin is warm and dry.  Neurological:     General: No focal deficit present.     Mental Status: She is alert.  Psychiatric:        Mood and Affect: Mood normal.        Behavior: Behavior normal.     ------------------------------------------------------------------------------------------------------------------------------------------------------------------------------------------------------------------- Assessment and Plan  Palpitations Has had normal EKGs previously Seems to be related to episodes of GERD, possibly due to esophageal spasm.  Will get Echocardiogram to evaluate for any structural abnormalities that may be contributing to palpitations. Consider holter monitor if this persists.    Irritation of ear, bilateral Bilateral erythema and flaking, appearance consistent with eczema with possible superinfection.  Will provide rx for cortisporin drops.     Meds ordered this encounter  Medications  . neomycin-polymyxin-hydrocortisone (CORTISPORIN) OTIC solution    Sig: Place 4 drops into both ears 4 (four) times daily.    Dispense:  10 mL    Refill:  0    No follow-ups on file.    This visit occurred during the SARS-CoV-2 public health emergency.  Safety protocols were in place, including screening questions prior to the visit, additional usage of staff PPE, and extensive  cleaning of exam room while observing appropriate contact time as indicated for disinfecting solutions.

## 2020-03-13 ENCOUNTER — Encounter: Payer: Self-pay | Admitting: Family Medicine

## 2020-03-13 MED ORDER — NEOMYCIN-POLYMYXIN-HC 3.5-10000-1 OT SOLN: 4.0000 [drp] | Freq: Four times a day (QID) | OTIC | 0 refills | Status: DC

## 2020-03-13 NOTE — Telephone Encounter (Signed)
RX pended 

## 2020-03-16 ENCOUNTER — Telehealth: Payer: Self-pay | Admitting: Family Medicine

## 2020-03-16 NOTE — Telephone Encounter (Signed)
I'm told they do not do them here in [place].  Please schedule in [facility] or [place].  Thanks!  CM

## 2020-03-16 NOTE — Telephone Encounter (Signed)
No Auth required for Echo Per Apolonio Schneiders in Triage at [facility]- ready to schedule appt with patient

## 2020-03-23 ENCOUNTER — Encounter (HOSPITAL_COMMUNITY): Payer: Self-pay | Admitting: Family Medicine

## 2020-03-31 ENCOUNTER — Encounter: Payer: Self-pay | Admitting: Family Medicine

## 2020-04-01 ENCOUNTER — Encounter: Payer: Self-pay | Admitting: Family Medicine

## 2020-04-07 ENCOUNTER — Telehealth (HOSPITAL_COMMUNITY): Payer: Self-pay | Admitting: Family Medicine

## 2020-04-07 NOTE — Telephone Encounter (Signed)
Just an FYI. We have made several attempts to contact this patient including sending a letter to schedule or reschedule their echocardiogram. We will be removing the patient from the echo [place].  03/23/20 MAILED LETTER  03/19/20 Call cant be completed @ this time @ 10:23/[facility]  03/17/20 Unable to lm no VM to schedule they do not do in [place] @ 8:20/[facility]  03/16/20 Patient declined appt in [place] and wants in [place]/[facility] 4:22  03/16/20 NO PA# needed per Suanne Marker /[facility] (per Apolonio Schneiders in Triage Door)  03/16/20 Sent INBASKET x 2 for PA# /[facility]  03/09/20 INBASKET Sent to rquest PA# before scheduling @ 1:59/[facility]  NO AUTH REQUIRED PER RACHEL IN TRIAGE -03/16/20- JP     Thank you

## 2020-04-08 ENCOUNTER — Ambulatory Visit: Payer: Medicare Other | Admitting: Family Medicine

## 2020-04-09 ENCOUNTER — Ambulatory Visit (INDEPENDENT_AMBULATORY_CARE_PROVIDER_SITE_OTHER): Payer: Medicare HMO | Admitting: Family Medicine

## 2020-04-09 ENCOUNTER — Encounter: Payer: Self-pay | Admitting: Family Medicine

## 2020-04-09 VITALS — BP 132/59 | HR 85 | Temp 97.6°F | Ht 62.99 in | Wt 191.8 lb

## 2020-04-09 DIAGNOSIS — Z7185 Encounter for immunization safety counseling: Secondary | ICD-10-CM

## 2020-04-09 DIAGNOSIS — R309 Painful micturition, unspecified: Secondary | ICD-10-CM

## 2020-04-09 DIAGNOSIS — I10 Essential (primary) hypertension: Secondary | ICD-10-CM | POA: Diagnosis not present

## 2020-04-09 DIAGNOSIS — Z7189 Other specified counseling: Secondary | ICD-10-CM

## 2020-04-09 DIAGNOSIS — R002 Palpitations: Secondary | ICD-10-CM

## 2020-04-09 DIAGNOSIS — K219 Gastro-esophageal reflux disease without esophagitis: Secondary | ICD-10-CM | POA: Diagnosis not present

## 2020-04-09 HISTORY — DX: Encounter for immunization safety counseling: Z71.85

## 2020-04-09 LAB — COMPLETE METABOLIC PANEL WITH GFR
AG Ratio: 1.6 (calc) (ref 1.0–2.5)
ALT: 39 U/L — ABNORMAL HIGH (ref 6–29)
AST: 29 U/L (ref 10–35)
Albumin: 4.5 g/dL (ref 3.6–5.1)
Alkaline phosphatase (APISO): 82 U/L (ref 37–153)
BUN: 14 mg/dL (ref 7–25)
CO2: 29 mmol/L (ref 20–32)
Calcium: 10.6 mg/dL — ABNORMAL HIGH (ref 8.6–10.4)
Chloride: 98 mmol/L (ref 98–110)
Creat: 0.87 mg/dL (ref 0.50–1.05)
GFR, Est African American: 86 mL/min/{1.73_m2} (ref >=60)
GFR, Est Non African American: 74 mL/min/{1.73_m2} (ref >=60)
Globulin: 2.9 g/dL (calc) (ref 1.9–3.7)
Glucose, Bld: 97 mg/dL (ref 65–99)
Potassium: 4 mmol/L (ref 3.5–5.3)
Sodium: 138 mmol/L (ref 135–146)
Total Bilirubin: 0.5 mg/dL (ref 0.2–1.2)
Total Protein: 7.4 g/dL (ref 6.1–8.1)

## 2020-04-09 LAB — CBC
HCT: 39.8 % (ref 35.0–45.0)
Hemoglobin: 13.6 g/dL (ref 11.7–15.5)
MCH: 30.4 pg (ref 27.0–33.0)
MCHC: 34.2 g/dL (ref 32.0–36.0)
MCV: 88.8 fL (ref 80.0–100.0)
MPV: 10.3 fL (ref 7.5–12.5)
Platelets: 387 10*3/uL (ref 140–400)
RBC: 4.48 10*6/uL (ref 3.80–5.10)
RDW: 13.4 % (ref 11.0–15.0)
WBC: 5.9 10*3/uL (ref 3.8–10.8)

## 2020-04-09 LAB — POCT URINALYSIS DIP (CLINITEK)
Bilirubin, UA: NEGATIVE
Blood, UA: NEGATIVE
Glucose, UA: NEGATIVE mg/dL
Ketones, POC UA: NEGATIVE mg/dL
Leukocytes, UA: NEGATIVE
Nitrite, UA: NEGATIVE
POC PROTEIN,UA: NEGATIVE
Spec Grav, UA: <=1.005 — AB (ref 1.010–1.025)
Urobilinogen, UA: 0.2 E.U./dL
pH, UA: 6.5 (ref 5.0–8.0)

## 2020-04-09 LAB — TSH: TSH: 3.73 mIU/L (ref 0.40–4.50)

## 2020-04-09 MED ORDER — PANTOPRAZOLE SODIUM 40 MG PO TBEC: 40.0000 mg | DELAYED_RELEASE_TABLET | Freq: Every day | ORAL | 3 refills | Status: DC

## 2020-04-09 NOTE — Assessment & Plan Note (Signed)
Discussed with her that side effects she experienced with first vaccine are common.  May occur with 2nd dose but I think safe to take.

## 2020-04-09 NOTE — Patient Instructions (Addendum)
Lets try restarting pantoprazole for nausea and reflux.  Have labs completed today.  I have re-ordered Echocardiogram.  You should be contacted to set up appt.

## 2020-04-09 NOTE — Assessment & Plan Note (Signed)
Blood pressure is at goal at for age and co-morbidities.  I recommend she continue current medicaitons.  In addition they were instructed to follow a low sodium diet with regular exercise to help to maintain adequate control of blood pressure.

## 2020-04-09 NOTE — Progress Notes (Signed)
Janet Richards - 59 y.o. female MRN [id number]  Date of birth: [birth year]  Subjective No chief complaint on file.   HPI Janet Richards is a 59 y.o. female here today to discuss 2nd covid vaccine.  Concerned about getting 2nd vaccine because she had sore shoulder/body aches with 1st vaccine.   She also had some pain with urination.  She denies urinary frequency, flank pain, fever, chills.   She has had some increased reflux symptoms. This was better controlled with pantoprazole but she has been out of this medication.  She has had some epigastric pain.  Denies nausea or changes to stools.    She has continued to have some intermittent palpitations.  Echo ordered previously however she changed phone number ans was unable to be contacted to schedule.     ROS:  A comprehensive ROS was completed and negative except as noted per HPI  Allergies  Allergen Reactions  . Azithromycin Nausea And Vomiting  . Clindamycin/Lincomycin Hives    Facial swelling    Past Medical History:  Diagnosis Date  . Asthma   . Chronic pancreatitis ([place])   . Essential hypertension, benign   . Menstrual migraine   . Periodontitis   . Prediabetes 10/17/2014  . Primary osteoarthritis of both knees   . Sinusitis   . Urticaria     Past Surgical History:  Procedure Laterality Date  . TUBAL LIGATION  12/26/1987    Social History   Socioeconomic History  . Marital status: Married    Spouse name: Not on file  . Number of children: 3  . Years of education: Not on file  . Highest education level: Not on file  Occupational History  . Occupation: homemaker  Tobacco Use  . Smoking status: Former Smoker    Packs/day: 1.50    Types: Cigarettes    Quit date: 02/05/2006    Years since quitting: 14.1  . Smokeless tobacco: Never Used  Substance and Sexual Activity  . Alcohol use: Yes    Comment: rarely  . Drug use: No  . Sexual activity: Yes    Partners: Male  Other Topics Concern  . Not on file   Social History Narrative  . Not on file   Social Determinants of Health   Financial Resource Strain: Low Risk   . Difficulty of Paying Living Expenses: Not hard at all  Food Insecurity: No Food Insecurity  . Worried About Charity fundraiser in the Last Year: Never true  . Ran Out of Food in the Last Year: Never true  Transportation Needs: No Transportation Needs  . Lack of Transportation (Medical): No  . Lack of Transportation (Non-Medical): No  Physical Activity:   . Days of Exercise per Week:   . Minutes of Exercise per Session:   Stress:   . Feeling of Stress :   Social Connections:   . Frequency of Communication with Friends and Family:   . Frequency of Social Gatherings with Friends and Family:   . Attends Religious Services:   . Active Member of Clubs or Organizations:   . Attends Archivist Meetings:   [name] Marital Status:     Family History  Problem Relation Age of Onset  . Hypertension Mother   . CAD Father        MI in his 4s  . Allergic rhinitis Father   . Urticaria Sister   . Colon cancer Sister   . Allergic rhinitis Son   . Asthma  Son   . Angioedema Neg Hx   . Eczema Neg Hx   . Immunodeficiency Neg Hx   . Esophageal cancer Neg Hx   . Stomach cancer Neg Hx   . Rectal cancer Neg Hx     Health Maintenance  Topic Date Due  . COVID-19 Vaccine (1) Never done  . INFLUENZA VACCINE  06/07/2020  . MAMMOGRAM  08/27/2021  . PAP SMEAR-Modifier  10/22/2022  . TETANUS/TDAP  10/17/2024  . COLONOSCOPY  03/19/2025  . Hepatitis C Screening  Completed  . HIV Screening  Completed     ----------------------------------------------------------------------------------------------------------------------------------------------------------------------------------------------------------------- Physical Exam BP (!) 132/59 (BP Location: Left Arm, Patient Position: Sitting, Cuff Size: Normal)   Pulse 85   Temp 97.6 F (36.4 C)   Ht 5' 2.99" (1.6 m)   Wt  191 lb 12.8 oz (87 kg)   LMP 09/24/2017   SpO2 100%   BMI 33.98 kg/m   Physical Exam Constitutional:      Appearance: Normal appearance.  HENT:     Head: Normocephalic and atraumatic.  Eyes:     General: No scleral icterus. Cardiovascular:     Rate and Rhythm: Normal rate and regular rhythm.  Pulmonary:     Effort: Pulmonary effort is normal.     Breath sounds: Normal breath sounds.  Musculoskeletal:     Cervical back: Neck supple.  Skin:    General: Skin is warm and dry.  Neurological:     General: No focal deficit present.     Mental Status: She is alert.  Psychiatric:        Mood and Affect: Mood normal.     ------------------------------------------------------------------------------------------------------------------------------------------------------------------------------------------------------------------- Assessment and Plan  Palpitations Updated labs ordered Echo re-ordered.   GERD (gastroesophageal reflux disease) Start protonix bid x2 weeks then decrease to daily use.    Essential hypertension Blood pressure is at goal at for age and co-morbidities.  I recommend she continue current medicaitons.  In addition they were instructed to follow a low sodium diet with regular exercise to help to maintain adequate control of blood pressure.    Vaccine counseling Discussed with her that side effects she experienced with first vaccine are common.  May occur with 2nd dose but I think safe to take.    Meds ordered this encounter  Medications  . pantoprazole (PROTONIX) 40 MG tablet    Sig: Take 1 tablet (40 mg total) by mouth daily. Take bid x2 weeks then decrease to once daily    Dispense:  60 tablet    Refill:  3    No follow-ups on file.    This visit occurred during the SARS-CoV-2 public health emergency.  Safety protocols were in place, including screening questions prior to the visit, additional usage of staff PPE, and extensive cleaning of exam  room while observing appropriate contact time as indicated for disinfecting solutions.

## 2020-04-09 NOTE — Assessment & Plan Note (Signed)
Updated labs ordered Echo re-ordered.

## 2020-04-09 NOTE — Assessment & Plan Note (Signed)
Start protonix bid x2 weeks then decrease to daily use.

## 2020-04-14 ENCOUNTER — Encounter: Payer: Self-pay | Admitting: Family Medicine

## 2020-04-15 ENCOUNTER — Other Ambulatory Visit: Payer: Self-pay | Admitting: Family Medicine

## 2020-04-15 DIAGNOSIS — M25571 Pain in right ankle and joints of right foot: Secondary | ICD-10-CM

## 2020-04-18 DIAGNOSIS — R0789 Other chest pain: Secondary | ICD-10-CM | POA: Diagnosis not present

## 2020-04-18 DIAGNOSIS — Z79899 Other long term (current) drug therapy: Secondary | ICD-10-CM | POA: Diagnosis not present

## 2020-04-18 DIAGNOSIS — R9431 Abnormal electrocardiogram [ECG] [EKG]: Secondary | ICD-10-CM | POA: Diagnosis not present

## 2020-04-18 DIAGNOSIS — Z87891 Personal history of nicotine dependence: Secondary | ICD-10-CM | POA: Diagnosis not present

## 2020-04-18 DIAGNOSIS — R7989 Other specified abnormal findings of blood chemistry: Secondary | ICD-10-CM | POA: Diagnosis not present

## 2020-04-18 DIAGNOSIS — E785 Hyperlipidemia, unspecified: Secondary | ICD-10-CM | POA: Diagnosis not present

## 2020-04-18 DIAGNOSIS — Z886 Allergy status to analgesic agent status: Secondary | ICD-10-CM | POA: Diagnosis not present

## 2020-04-18 DIAGNOSIS — Z881 Allergy status to other antibiotic agents status: Secondary | ICD-10-CM | POA: Diagnosis not present

## 2020-04-18 DIAGNOSIS — Z888 Allergy status to other drugs, medicaments and biological substances status: Secondary | ICD-10-CM | POA: Diagnosis not present

## 2020-04-18 DIAGNOSIS — Z880 Allergy status to penicillin: Secondary | ICD-10-CM | POA: Diagnosis not present

## 2020-04-18 DIAGNOSIS — R079 Chest pain, unspecified: Secondary | ICD-10-CM | POA: Diagnosis not present

## 2020-04-20 ENCOUNTER — Encounter: Payer: Self-pay | Admitting: Family Medicine

## 2020-04-27 ENCOUNTER — Ambulatory Visit (INDEPENDENT_AMBULATORY_CARE_PROVIDER_SITE_OTHER): Payer: Medicare HMO | Admitting: Nurse Practitioner

## 2020-04-27 ENCOUNTER — Encounter: Payer: Self-pay | Admitting: Nurse Practitioner

## 2020-04-27 ENCOUNTER — Other Ambulatory Visit: Payer: Self-pay

## 2020-04-27 VITALS — BP 121/75 | HR 83 | Temp 98.2°F | Ht 63.0 in | Wt 192.2 lb

## 2020-04-27 DIAGNOSIS — B009 Herpesviral infection, unspecified: Secondary | ICD-10-CM

## 2020-04-27 DIAGNOSIS — B369 Superficial mycosis, unspecified: Secondary | ICD-10-CM | POA: Diagnosis not present

## 2020-04-27 MED ORDER — KETOCONAZOLE 2 % EX CREA: 1.0000 "application " | TOPICAL_CREAM | Freq: Two times a day (BID) | CUTANEOUS | 1 refills | Status: DC

## 2020-04-27 MED ORDER — VALACYCLOVIR HCL 1 G PO TABS: 1000.0000 mg | ORAL_TABLET | Freq: Three times a day (TID) | ORAL | 1 refills | Status: DC

## 2020-04-27 NOTE — Patient Instructions (Addendum)
Wash your face with a very gentle cleanser like Dove or Cetaphil.  Avoid cleansers that have Retinol in them, as these may irritate your skin again.  You can use the prescription cream one to two times a day to the affected areas on your face for the next 10 days.   I will send the swab away to be tested for herpes simplex virus. We will start valacyclovir today to be safe while we wait for the results. You will take this medication three times a day for 7 days. I will let you know the results of the swab.

## 2020-04-27 NOTE — Progress Notes (Signed)
Acute Office Visit  Subjective:    Patient ID: Janet Richards, female    DOB: [birth year], 59 y.o.   MRN: [id number]  Chief Complaint  Patient presents with  . Rash    HPI Patient is in today for redness and irritation to the skin on her face after using a new face cream with retinol in it last week. She reports significant burning and irritation after applying the facial cream. She also reports burning and numbness in the roof of her mouth and on her gums. She reports the numbness has subsided but areas of her face are now discolored and feel raw.   She also reports small blisters that appear intermittently to the inner folds of her buttocks. She reports the blister areas are tender to the touch and burn when they first appear. She reports they heal on their own without intervention and only occur sporadically. She did have exposure to herpes simplex virus many years ago from her ex-husband.   Past Medical History:  Diagnosis Date  . Asthma   . Chronic pancreatitis ([place])   . Essential hypertension, benign   . Menstrual migraine   . Periodontitis   . Prediabetes 10/17/2014  . Primary osteoarthritis of both knees   . Sinusitis   . Urticaria     Past Surgical History:  Procedure Laterality Date  . TUBAL LIGATION  12/26/1987    Family History  Problem Relation Age of Onset  . Hypertension Mother   . CAD Father        MI in his 60s  . Allergic rhinitis Father   . Urticaria Sister   . Colon cancer Sister   . Allergic rhinitis Son   . Asthma Son   . Angioedema Neg Hx   . Eczema Neg Hx   . Immunodeficiency Neg Hx   . Esophageal cancer Neg Hx   . Stomach cancer Neg Hx   . Rectal cancer Neg Hx     Social History   Socioeconomic History  . Marital status: Married    Spouse name: Not on file  . Number of children: 3  . Years of education: Not on file  . Highest education level: Not on file  Occupational History  . Occupation: homemaker  Tobacco Use  . Smoking  status: Former Smoker    Packs/day: 1.50    Types: Cigarettes    Quit date: 02/05/2006    Years since quitting: 14.2  . Smokeless tobacco: Never Used  Vaping Use  . Vaping Use: Never used  Substance and Sexual Activity  . Alcohol use: Yes    Comment: rarely  . Drug use: No  . Sexual activity: Yes    Partners: Male  Other Topics Concern  . Not on file  Social History Narrative  . Not on file   Social Determinants of Health   Financial Resource Strain: Low Risk   . Difficulty of Paying Living Expenses: Not hard at all  Food Insecurity: No Food Insecurity  . Worried About Charity fundraiser in the Last Year: Never true  . Ran Out of Food in the Last Year: Never true  Transportation Needs: No Transportation Needs  . Lack of Transportation (Medical): No  . Lack of Transportation (Non-Medical): No  Physical Activity:   . Days of Exercise per Week:   . Minutes of Exercise per Session:   Stress:   . Feeling of Stress :   Social Connections:   . Frequency of Communication  with Friends and Family:   . Frequency of Social Gatherings with Friends and Family:   . Attends Religious Services:   . Active Member of Clubs or Organizations:   . Attends Archivist Meetings:   [name] Marital Status:   Intimate Partner Violence:   . Fear of Current or Ex-Partner:   . Emotionally Abused:   [name] Physically Abused:   . Sexually Abused:     Outpatient Medications Prior to Visit  Medication Sig Dispense Refill  . cetirizine HCl (ZYRTEC) 1 MG/ML solution Take 5 mg by mouth daily.    [name] conjugated estrogens (PREMARIN) vaginal cream Place 1 Applicatorful vaginally daily. For first 2 weeks then decrease to 3 times a week. 42.5 g 12  . famotidine (PEPCID) 10 MG tablet Take 10 mg by mouth 2 (two) times daily.    . hydrochlorothiazide (HYDRODIURIL) 25 MG tablet Take 1 tablet (25 mg total) by mouth daily. 90 tablet 3  . ibuprofen (ADVIL) 800 MG tablet Take 1 tablet (800 mg total) by mouth every 8  (eight) hours as needed. 30 tablet 0  . ipratropium (ATROVENT) 0.03 % nasal spray Place 2 sprays into both nostrils 4 (four) times daily.    . Multiple Vitamins-Minerals (MULTIVITAMIN ADULT PO) Take by mouth.    . neomycin-polymyxin-hydrocortisone (CORTISPORIN) OTIC solution Place 4 drops into both ears 4 (four) times daily. (Patient not taking: Reported on 04/27/2020) 10 mL 0  . oxybutynin (DITROPAN-XL) 5 MG 24 hr tablet Take 1 tablet (5 mg total) by mouth at bedtime. (Patient not taking: Reported on 04/27/2020) 90 tablet 3  . oxymetazoline (AFRIN) 0.05 % nasal spray Place into the nose. (Patient not taking: Reported on 04/27/2020)    . pantoprazole (PROTONIX) 40 MG tablet Take 1 tablet (40 mg total) by mouth daily. Take bid x2 weeks then decrease to once daily (Patient not taking: Reported on 04/27/2020) 60 tablet 3   Facility-Administered Medications Prior to Visit  Medication Dose Route Frequency Provider Last Rate Last Admin  . 0.9 %  sodium chloride infusion  500 mL Intravenous Once Cirigliano, Vito V, DO        Allergies  Allergen Reactions  . Azithromycin Nausea And Vomiting  . Clindamycin/Lincomycin Hives    Facial swelling    Review of Systems  Constitutional: Negative for activity change, appetite change, chills, fatigue and fever.  HENT: Negative for congestion, facial swelling, mouth sores, postnasal drip, rhinorrhea, sinus pressure, sinus pain, sore throat, trouble swallowing and voice change.   Eyes: Negative for pain, discharge and itching.  Respiratory: Negative for cough, chest tightness and shortness of breath.   Cardiovascular: Negative for chest pain, palpitations and leg swelling.  Genitourinary: Positive for genital sores.  Musculoskeletal: Negative for myalgias.  Skin: Positive for color change and rash.  Neurological: Negative for dizziness and headaches.  Hematological: Negative for adenopathy.       Objective:    Physical Exam Vitals and nursing note  reviewed.  Constitutional:      Appearance: Normal appearance.  HENT:     Head: Normocephalic.     Comments: Altered coloration and erythema of the skin across the cheeks bilaterally, bridge of the nose, and forehead. [place] lamp inspection reveals florescent glow indicating fungal presence.     Nose: Nose normal.     Mouth/Throat:     Mouth: Mucous membranes are moist.     Pharynx: Oropharynx is clear.  Eyes:     Extraocular Movements: Extraocular movements intact.  Conjunctiva/sclera: Conjunctivae normal.     Pupils: Pupils are equal, round, and reactive to light.  Cardiovascular:     Rate and Rhythm: Normal rate.     Pulses: Normal pulses.  Pulmonary:     Effort: Pulmonary effort is normal.  Genitourinary:   Musculoskeletal:        General: Normal range of motion.     Cervical back: Normal range of motion.  Skin:    General: Skin is warm and dry.     Capillary Refill: Capillary refill takes less than 2 seconds.     Findings: Erythema, lesion and rash present.  Neurological:     General: No focal deficit present.     Mental Status: She is alert and oriented to person, place, and time.  Psychiatric:        Mood and Affect: Mood normal.        Behavior: Behavior normal.        Thought Content: Thought content normal.        Judgment: Judgment normal.     BP 121/75   Pulse 83   Temp 98.2 F (36.8 C) (Oral)   Ht 5\' 3"  (1.6 m)   Wt 192 lb 3.2 oz (87.2 kg)   LMP 09/24/2017   SpO2 97%   BMI 34.05 kg/m  Wt Readings from Last 3 Encounters:  04/27/20 192 lb 3.2 oz (87.2 kg)  04/09/20 191 lb 12.8 oz (87 kg)  03/09/20 193 lb 4.8 oz (87.7 kg)    There are no preventive care reminders to display for this patient.  There are no preventive care reminders to display for this patient.   Lab Results  Component Value Date   TSH 3.73 04/09/2020   Lab Results  Component Value Date   WBC 5.9 04/09/2020   HGB 13.6 04/09/2020   HCT 39.8 04/09/2020   MCV 88.8  04/09/2020   PLT 387 04/09/2020   Lab Results  Component Value Date   NA 138 04/09/2020   K 4.0 04/09/2020   CO2 29 04/09/2020   GLUCOSE 97 04/09/2020   BUN 14 04/09/2020   CREATININE 0.87 04/09/2020   BILITOT 0.5 04/09/2020   ALKPHOS 68 08/01/2019   AST 29 04/09/2020   ALT 39 (H) 04/09/2020   PROT 7.4 04/09/2020   ALBUMIN 4.4 08/01/2019   CALCIUM 10.6 (H) 04/09/2020   ANIONGAP 7 09/05/2017   Lab Results  Component Value Date   CHOL 218 (H) 08/01/2019   Lab Results  Component Value Date   HDL 47.40 08/01/2019   Lab Results  Component Value Date   LDLCALC 133 (H) 08/01/2019   Lab Results  Component Value Date   TRIG 185.0 (H) 08/01/2019   Lab Results  Component Value Date   CHOLHDL 5 08/01/2019   Lab Results  Component Value Date   HGBA1C 6.2 08/01/2019       Assessment & Plan:   1. Fungal infection of skin Irritation and apparent fungal infection of the face after use of retinol containing skin products last week. Instructed patient to utilize gentle cleanser and apply a thin layer of ketoconazole cream to the affected areas one to two times per day for the next 10 days, avoiding contact with the eyes.  PLAN: - Avoid harsh cleansers, soaps, and lotions - Avoid products with Retinol - Avoid sun exposure to the face - Utilize gentle non-scented facial cleanser and moisturizer daily using only the fingertips to wash gently - Utilize ketoconazole cream to  the affected areas twice a day for 10 days. Apply a thin layer after washing. - If irritation persists or new irritation develops, discontinue ketoconazole and notify the office.   - ketoconazole (NIZORAL) 2 % cream; Apply 1 application topically 2 (two) times daily. To affected areas.  Dispense: 30 g; Refill: 1  2. Herpes simplex Symptoms and presentation consistent with herpes simplex virus. Patient does have a history of exposure in the past. Swab taken of the area, but unable to be utilized due to  contamination prior to sending to the lab.   PLAN: - 7 day course of Valacyclovir three times a day for suspected herpes simplex infection.  - Due to contamination, viral swab unable to be utilized - treatment provided prophyllactically - Contact the office if symptoms persist despite treatment or symptoms return once treatment has been completed - Will consider a new viral swab or a culture of the area if the irritation persists.   - valACYclovir (VALTREX) 1000 MG tablet; Take 1 tablet (1,000 mg total) by mouth 3 (three) times daily.  Dispense: 21 tablet; Refill: 1    Orma Render, NP

## 2020-05-02 ENCOUNTER — Encounter: Payer: Self-pay | Admitting: Nurse Practitioner

## 2020-05-05 ENCOUNTER — Encounter: Payer: Self-pay | Admitting: Podiatry

## 2020-05-07 ENCOUNTER — Encounter: Payer: Self-pay | Admitting: Family Medicine

## 2020-05-07 ENCOUNTER — Ambulatory Visit (INDEPENDENT_AMBULATORY_CARE_PROVIDER_SITE_OTHER): Payer: Medicare HMO | Admitting: Family Medicine

## 2020-05-07 DIAGNOSIS — K0530 Chronic periodontitis, unspecified: Secondary | ICD-10-CM | POA: Diagnosis not present

## 2020-05-07 MED ORDER — CEPHALEXIN 500 MG PO CAPS
500.0000 mg | ORAL_CAPSULE | Freq: Four times a day (QID) | ORAL | 0 refills | Status: AC
Start: 2020-05-07 — End: 2020-05-14

## 2020-05-07 NOTE — Progress Notes (Signed)
Janet Richards - 59 y.o. female MRN [id number]  Date of birth: 1961-10-04  Subjective Chief Complaint  Patient presents with  . gum infection    HPI Janet Richards is a 59 y.o. female here today with complaint of gum pain and swelling.  History of periodontal disease and issues with gum swelling and pain in the past.  She does have an appointment with her periodontist in a couple of weeks.  She denies fever, chills, drainage from gums or swallowing difficulties.  She has tried amoxicillin previously but didn't have full resolution.  Cephalexin as worked better for her in the past for this  ROS:  A comprehensive ROS was completed and negative except as noted per HPI  Allergies  Allergen Reactions  . Azithromycin Nausea And Vomiting  . Clindamycin/Lincomycin Hives    Facial swelling    Past Medical History:  Diagnosis Date  . Asthma   . Chronic pancreatitis ([place])   . Essential hypertension, benign   . Menstrual migraine   . Periodontitis   . Prediabetes 10/17/2014  . Primary osteoarthritis of both knees   . Sinusitis   . Urticaria     Past Surgical History:  Procedure Laterality Date  . TUBAL LIGATION  12/26/1987    Social History   Socioeconomic History  . Marital status: Married    Spouse name: Not on file  . Number of children: 3  . Years of education: Not on file  . Highest education level: Not on file  Occupational History  . Occupation: homemaker  Tobacco Use  . Smoking status: Former Smoker    Packs/day: 1.50    Types: Cigarettes    Quit date: 02/05/2006    Years since quitting: 14.2  . Smokeless tobacco: Never Used  Vaping Use  . Vaping Use: Never used  Substance and Sexual Activity  . Alcohol use: Yes    Comment: rarely  . Drug use: No  . Sexual activity: Yes    Partners: Male  Other Topics Concern  . Not on file  Social History Narrative  . Not on file   Social Determinants of Health   Financial Resource Strain: Low Risk   . Difficulty  of Paying Living Expenses: Not hard at all  Food Insecurity: No Food Insecurity  . Worried About Charity fundraiser in the Last Year: Never true  . Ran Out of Food in the Last Year: Never true  Transportation Needs: No Transportation Needs  . Lack of Transportation (Medical): No  . Lack of Transportation (Non-Medical): No  Physical Activity:   . Days of Exercise per Week:   . Minutes of Exercise per Session:   Stress:   . Feeling of Stress :   Social Connections:   . Frequency of Communication with Friends and Family:   . Frequency of Social Gatherings with Friends and Family:   . Attends Religious Services:   . Active Member of Clubs or Organizations:   . Attends Archivist Meetings:   [name] Marital Status:     Family History  Problem Relation Age of Onset  . Hypertension Mother   . CAD Father        MI in his 91s  . Allergic rhinitis Father   . Urticaria Sister   . Colon cancer Sister   . Allergic rhinitis Son   . Asthma Son   . Angioedema Neg Hx   . Eczema Neg Hx   . Immunodeficiency Neg Hx   .  Esophageal cancer Neg Hx   . Stomach cancer Neg Hx   . Rectal cancer Neg Hx     Health Maintenance  Topic Date Due  . INFLUENZA VACCINE  06/07/2020  . MAMMOGRAM  08/27/2021  . PAP SMEAR-Modifier  10/22/2022  . TETANUS/TDAP  10/17/2024  . COLONOSCOPY  03/19/2025  . COVID-19 Vaccine  Completed  . Hepatitis C Screening  Completed  . HIV Screening  Completed     ----------------------------------------------------------------------------------------------------------------------------------------------------------------------------------------------------------------- Physical Exam BP (!) 144/78 (BP Location: Left Arm, Patient Position: Sitting, Cuff Size: Normal)   Pulse 91   Ht 5' 2.99" (1.6 m)   Wt 193 lb 9.6 oz (87.8 kg)   LMP 09/24/2017   SpO2 100%   BMI 34.30 kg/m   Physical Exam Constitutional:      Appearance: Normal appearance.  HENT:     Head:  Normocephalic and atraumatic.     Mouth/Throat:     Comments: Gum erythema with mild ttp.   Lymphadenopathy:     Cervical: No cervical adenopathy.  Skin:    General: Skin is warm and dry.  Neurological:     General: No focal deficit present.     Mental Status: She is alert.  Psychiatric:        Mood and Affect: Mood normal.        Behavior: Behavior normal.     ------------------------------------------------------------------------------------------------------------------------------------------------------------------------------------------------------------------- Assessment and Plan  Periodontitis Rx for cephalexin printed for her.  She will keep f/u with her periodontist.     Meds ordered this encounter  Medications  . cephALEXin (KEFLEX) 500 MG capsule    Sig: Take 1 capsule (500 mg total) by mouth 4 (four) times daily for 7 days.    Dispense:  28 capsule    Refill:  0    No follow-ups on file.    This visit occurred during the SARS-CoV-2 public health emergency.  Safety protocols were in place, including screening questions prior to the visit, additional usage of staff PPE, and extensive cleaning of exam room while observing appropriate contact time as indicated for disinfecting solutions.

## 2020-05-07 NOTE — Patient Instructions (Signed)
Periodontal Disease Periodontal disease, also called gum disease or gingivitis, is inflammation, infection, or both that affects the tissue that surrounds and supports the teeth (periodontal tissue). Periodontal tissue includes the gums, the tissues (ligaments) that hold the teeth in place, and the tooth sockets (alveolar bones). Periodontal disease can affect tissue around one tooth or many teeth. If this condition is not treated, it can cause you to lose a tooth. What are the causes? This condition is usually caused by plaque. Plaque contains harmful bacteria that can cause gums to become swollen and infected. If periodontal disease gets worse (progresses), it can also damage other supporting tissues. Plaque can develop due to poor oral care, such as not brushing teeth enough, not visiting the dentist regularly, or eating and drinking too many sugary foods and beverages. What increases the risk? This condition is more likely to develop in people who:  Smoke.  Use tobacco.  Clench or grind their teeth.  Abuse substances.  Are going through the hormonal changes of puberty, menopause, or pregnancy.  Are under stress.  Are taking certain medicines, such as steroids, antiseizure medicines, or medicines to treat cancer.  Have diabetes.  Have poor nutrition.  Have a disease that interferes with the body's disease-fighting system (immune system).  Have a family history of periodontal disease. What are the signs or symptoms? Symptoms of this condition include:  Red or swollen gums.  Bad breath that does not go away.  Gums that have pulled away from the teeth.  Gums that bleed easily.  Teeth that are loose or separating.  Pain when chewing.  Changes in the way your teeth fit together.  Sensitive teeth. How is this diagnosed? This condition is diagnosed with an exam of the tissue around your teeth. Your health care provider may also take an X-ray of your teeth and ask about  your medical history. How is this treated? Treatment for this condition depends on the extent of the disease, which is determined by a dental exam. Treatment may include:  Brushing and flossing regularly.  A deep dental cleaning that involves scraping the buildup of plaque and tartar from below the gum line (scaling and root planing). This may be needed if the disease progresses.  Antibiotic medicines. These may be taken by mouth (orally) or as a rinse.  Surgery, in some severe cases. This may involve a surgery to lift up the gums and remove tartar deposits or to reduce a pocket of periodontal disease (flap surgery). Surgery might also involve procedures that replace damaged areas and help healthy bone and tissue to grow (bone and tissue grafting). Follow these instructions at home:   Practice good oral hygiene: ? Brush your teeth two times a day with a soft toothbrush. ? Floss between your teeth every day. ? Get regular dental exams.  If you were prescribed antibiotic medicine or a rinse, take or use it as told by your health care provider. Do not stop taking or using the antibiotic even if your condition improves.  Take over-the-counter and prescription medicines only as told by your health care provider.  Do not use any products that contain nicotine or tobacco, such as cigarettes and e-cigarettes. If you need help quitting, ask your health care provider.  Eat a well-balanced diet that includes plenty of vegetables, fruits, whole grains, low-fat dairy products, and lean protein. ? Do not eat a lot of foods that are high in solid fats, added sugars, or salt. ? Avoid beverages that contain a  lot of sugar. Contact a health care provider if:  Your symptoms do not improve. Get help right away if:  You have swelling in your face, neck, or jaw.  You have severe pain that does not get better with medicine.  You have a fever. Summary  Periodontal disease, also called gum disease or  gingivitis, is inflammation, infection, or both that affects the tissue that surrounds and supports the teeth (periodontal tissue).  Practice good oral hygiene. Brush your teeth two times a day with a soft toothbrush. Floss between your teeth every day.  Do not use any products that contain nicotine or tobacco, such as cigarettes and e-cigarettes. If you need help quitting, ask your health care provider.  If you were prescribed antibiotic medicine or a rinse, take or use it as told by your health care provider. Do not stop taking or using the antibiotic even if your condition improves. This information is not intended to replace advice given to you by your health care provider. Make sure you discuss any questions you have with your health care provider. Document Revised: 10/06/2017 Document Reviewed: 08/05/2016 Elsevier Patient Education  2020 [employer].

## 2020-05-07 NOTE — Assessment & Plan Note (Signed)
Rx for cephalexin printed for her.  She will keep f/u with her periodontist.

## 2020-05-08 ENCOUNTER — Encounter: Payer: Self-pay | Admitting: Podiatry

## 2020-05-08 ENCOUNTER — Ambulatory Visit (INDEPENDENT_AMBULATORY_CARE_PROVIDER_SITE_OTHER): Payer: Medicare HMO | Admitting: Podiatry

## 2020-05-08 ENCOUNTER — Other Ambulatory Visit: Payer: Self-pay

## 2020-05-08 VITALS — BP 101/68 | HR 90 | Temp 97.3°F | Resp 16

## 2020-05-08 DIAGNOSIS — G629 Polyneuropathy, unspecified: Secondary | ICD-10-CM | POA: Diagnosis not present

## 2020-05-08 DIAGNOSIS — M7989 Other specified soft tissue disorders: Secondary | ICD-10-CM | POA: Diagnosis not present

## 2020-05-08 NOTE — Progress Notes (Signed)
Subjective:   Patient ID: Janet Richards, female   DOB: 59 y.o.   MRN: [id number]   HPI 59 year old female presents the office today for concerns of a cyst to the lateral aspect of the right ankle.  She states that she had this previously but is not currently present she is having no pain.  This seems to be an ongoing issue as she did see a podiatrist in 2018 for similar issue.  She did have orthotics previously.  Currently no pain to the lateral aspect ankle where she gets the cyst.  Also she states that she was diagnosed with "mild neuropathy".  She was on gabapentin starting at 100 mg and was increased to 300 mg.  She weaned herself off of this and she stopped it as she did not want side effects.  She did see neurologist at the time.  She has no other concerns today.   Review of Systems  All other systems reviewed and are negative.  Past Medical History:  Diagnosis Date  . Asthma   . Chronic pancreatitis ([place])   . Essential hypertension, benign   . Menstrual migraine   . Periodontitis   . Prediabetes 10/17/2014  . Primary osteoarthritis of both knees   . Sinusitis   . Urticaria     Past Surgical History:  Procedure Laterality Date  . TUBAL LIGATION  12/26/1987     Current Outpatient Medications:  .  cephALEXin (KEFLEX) 500 MG capsule, Take 1 capsule (500 mg total) by mouth 4 (four) times daily for 7 days., Disp: 28 capsule, Rfl: 0 .  cetirizine HCl (ZYRTEC) 1 MG/ML solution, Take 5 mg by mouth daily., Disp: , Rfl:  .  conjugated estrogens (PREMARIN) vaginal cream, Place 1 Applicatorful vaginally daily. For first 2 weeks then decrease to 3 times a week., Disp: 42.5 g, Rfl: 12 .  famotidine (PEPCID) 10 MG tablet, Take 10 mg by mouth 2 (two) times daily., Disp: , Rfl:  .  hydrochlorothiazide (HYDRODIURIL) 25 MG tablet, Take 1 tablet (25 mg total) by mouth daily., Disp: 90 tablet, Rfl: 3 .  ibuprofen (ADVIL) 800 MG tablet, Take 1 tablet (800 mg total) by mouth every 8 (eight)  hours as needed., Disp: 30 tablet, Rfl: 0 .  ipratropium (ATROVENT) 0.03 % nasal spray, Place 2 sprays into both nostrils 4 (four) times daily., Disp: , Rfl:  .  ketoconazole (NIZORAL) 2 % cream, Apply 1 application topically 2 (two) times daily. To affected areas., Disp: 30 g, Rfl: 1 .  montelukast (SINGULAIR) 10 MG tablet, , Disp: , Rfl:  .  Multiple Vitamins-Minerals (MULTIVITAMIN ADULT PO), Take by mouth., Disp: , Rfl:  .  oxybutynin (DITROPAN-XL) 5 MG 24 hr tablet, , Disp: , Rfl:  .  pantoprazole (PROTONIX) 40 MG tablet, , Disp: , Rfl:  .  valACYclovir (VALTREX) 1000 MG tablet, Take 1 tablet (1,000 mg total) by mouth 3 (three) times daily., Disp: 21 tablet, Rfl: 1  Current Facility-Administered Medications:  .  0.9 %  sodium chloride infusion, 500 mL, Intravenous, Once, Cirigliano, Vito V, DO  Allergies  Allergen Reactions  . Azithromycin Nausea And Vomiting  . Clindamycin/Lincomycin Hives    Facial swelling          Objective:  Physical Exam  General: AAO x3, NAD  Dermatological: Skin is warm, dry and supple bilateral. Nails x 10 are well manicured; remaining integument appears unremarkable at this time. There are no open sores, no preulcerative lesions, no rash or signs  of infection present.  Vascular: Dorsalis Pedis artery and Posterior Tibial artery pedal pulses are 2/4 bilateral with immedate capillary fill time. Pedal hair growth present. No varicosities and no lower extremity edema present bilateral. There is no pain with calf compression, swelling, warmth, erythema.   Neruologic: Grossly intact via light touch bilateral. Vibratory intact via tuning fork bilateral. Protective threshold with Semmes Wienstein monofilament intact to all pedal sites bilateral.  She is describing burning, tingling mostly at nighttime but overall sensation appears to be intact.  Musculoskeletal: Subjectively she had a cyst discomfort lateral aspect along the sinus tarsi where she pointed but  there is no current tenderness or fluid collection or soft tissue lesion.  Slight hammertoe contractures present of the second digits bilaterally.  Muscular strength 5/5 in all groups tested bilateral.  Gait: Unassisted, Nonantalgic.      Assessment:   59 year old female with neuropathy, possible cyst right ankle  Plan:  -Treatment options discussed including all alternatives, risks, and complications -Etiology of symptoms were discussed -There is currently no cyst or pain identified.  Due to this we held off on x-rays.  Discussed with her that if it were to come back to let me know and I like to see at the time the cyst is present. -Discussed treatment options for neuropathy.  She thinks this is becoming from shoes and history of previous injuries to her feet.  Discussed a B complex vitamin.  Trula Slade DPM

## 2020-05-11 ENCOUNTER — Encounter: Payer: Self-pay | Admitting: Family Medicine

## 2020-05-12 ENCOUNTER — Encounter: Payer: Self-pay | Admitting: Family Medicine

## 2020-05-14 ENCOUNTER — Other Ambulatory Visit: Payer: Self-pay | Admitting: Family Medicine

## 2020-05-14 MED ORDER — AMOXICILLIN-POT CLAVULANATE 875-125 MG PO TABS
1.0000 | ORAL_TABLET | Freq: Two times a day (BID) | ORAL | 0 refills | Status: DC
Start: 2020-05-14 — End: 2020-10-11

## 2020-05-14 NOTE — Telephone Encounter (Signed)
Patient calling back to check on the status of message below. Please advise.

## 2020-05-15 ENCOUNTER — Other Ambulatory Visit: Payer: Self-pay

## 2020-05-20 ENCOUNTER — Other Ambulatory Visit: Payer: Self-pay

## 2020-05-20 ENCOUNTER — Encounter: Payer: Self-pay | Admitting: Family Medicine

## 2020-05-20 ENCOUNTER — Ambulatory Visit (HOSPITAL_BASED_OUTPATIENT_CLINIC_OR_DEPARTMENT_OTHER)
Admission: RE | Admit: 2020-05-20 | Discharge: 2020-05-20 | Disposition: A | Discharge status: Home / Self Care | Payer: Medicare HMO | Source: Ambulatory Visit | Attending: Family Medicine | Admitting: Family Medicine

## 2020-05-20 DIAGNOSIS — I10 Essential (primary) hypertension: Secondary | ICD-10-CM | POA: Diagnosis not present

## 2020-05-20 DIAGNOSIS — R002 Palpitations: Secondary | ICD-10-CM | POA: Diagnosis not present

## 2020-05-25 ENCOUNTER — Other Ambulatory Visit: Payer: Self-pay | Admitting: Family Medicine

## 2020-05-25 DIAGNOSIS — Z1231 Encounter for screening mammogram for malignant neoplasm of breast: Secondary | ICD-10-CM

## 2020-05-27 ENCOUNTER — Ambulatory Visit

## 2020-05-28 ENCOUNTER — Encounter: Payer: Self-pay | Admitting: Family Medicine

## 2020-05-28 DIAGNOSIS — R8290 Unspecified abnormal findings in urine: Secondary | ICD-10-CM

## 2020-05-28 DIAGNOSIS — R350 Frequency of micturition: Secondary | ICD-10-CM

## 2020-05-29 ENCOUNTER — Encounter: Payer: Self-pay | Admitting: Family Medicine

## 2020-05-29 DIAGNOSIS — R8290 Unspecified abnormal findings in urine: Secondary | ICD-10-CM | POA: Diagnosis not present

## 2020-05-29 DIAGNOSIS — R350 Frequency of micturition: Secondary | ICD-10-CM | POA: Diagnosis not present

## 2020-05-30 ENCOUNTER — Encounter: Payer: Self-pay | Admitting: Family Medicine

## 2020-05-30 LAB — URINE CULTURE
MICRO NUMBER:: 10743738
Result:: NO GROWTH
SPECIMEN QUALITY:: ADEQUATE

## 2020-05-30 LAB — URINALYSIS, ROUTINE W REFLEX MICROSCOPIC
Bilirubin Urine: NEGATIVE
Glucose, UA: NEGATIVE
Hgb urine dipstick: NEGATIVE
Ketones, ur: NEGATIVE
Leukocytes,Ua: NEGATIVE
Nitrite: NEGATIVE
Protein, ur: NEGATIVE
Specific Gravity, Urine: 1.009 (ref 1.001–1.03)
pH: 5.5 (ref 5.0–8.0)

## 2020-08-27 ENCOUNTER — Ambulatory Visit

## 2020-10-11 ENCOUNTER — Other Ambulatory Visit: Payer: Self-pay

## 2020-10-11 ENCOUNTER — Encounter: Payer: Self-pay | Admitting: Emergency Medicine

## 2020-10-11 ENCOUNTER — Emergency Department (INDEPENDENT_AMBULATORY_CARE_PROVIDER_SITE_OTHER)
Admission: EM | Admit: 2020-10-11 | Discharge: 2020-10-11 | Disposition: A | Discharge status: Home / Self Care | Payer: Medicare HMO | Source: Home / Self Care

## 2020-10-11 DIAGNOSIS — K0510 Chronic gingivitis, plaque induced: Secondary | ICD-10-CM

## 2020-10-11 DIAGNOSIS — K0889 Other specified disorders of teeth and supporting structures: Secondary | ICD-10-CM

## 2020-10-11 MED ORDER — CHLORHEXIDINE GLUCONATE 0.12 % MT SOLN
15.0000 mL | Freq: Two times a day (BID) | OROMUCOSAL | 0 refills | Status: DC
Start: 2020-10-11 — End: 2021-01-07

## 2020-10-11 MED ORDER — AMOXICILLIN-POT CLAVULANATE 875-125 MG PO TABS
1.0000 | ORAL_TABLET | Freq: Two times a day (BID) | ORAL | 0 refills | Status: DC
Start: 2020-10-11 — End: 2021-01-07

## 2020-10-11 NOTE — ED Triage Notes (Signed)
Patient here for discomfort in lower left jaw in area a tooth needs to be removed. Has other body areas involved in what she believes she has a systemic bacterial infection. Has had covid and influenza vaccinations.

## 2020-10-11 NOTE — ED Provider Notes (Signed)
[name]    CSN: [id number] Arrival date & time: 10/11/20  1119      History   Chief Complaint Chief Complaint  Patient presents with  . Dental Pain    HPI Janet Richards is a 59 y.o. female.   HPI  Patient presents today with a concern of lower jaw pain related to a tooth that she needs to have removed.  She reports she has had problems with her lower molar tooth previously and has been unable to get in with her dentist recently to have tooth extracted.  She developed facial swelling along her left lower jawline yesterday with increased warmth and was concern for dental abscess.  She has been taking OTC medication for pain.  Denies fever, nausea or vomiting. Past Medical History:  Diagnosis Date  . Asthma   . Chronic pancreatitis ([place])   . Essential hypertension, benign   . Menstrual migraine   . Periodontitis   . Prediabetes 10/17/2014  . Primary osteoarthritis of both knees   . Sinusitis   . Urticaria     Patient Active Problem List   Diagnosis Date Noted  . Vaccine counseling 04/09/2020  . Palpitations 03/09/2020  . Irritation of ear, bilateral 03/09/2020  . Generalized headaches 02/18/2020  . GERD (gastroesophageal reflux disease) 02/18/2020  . Dysuria 09/16/2019  . Chronic pancreatitis ([place]) 04/10/2019  . Environmental allergies 09/12/2018  . Mild intermittent asthma without complication 03/00/9233  . Dyspepsia 07/31/2018  . Sensory neuronopathy 07/29/2018  . Post-menopausal bleeding 05/21/2018  . DDD (degenerative disc disease), cervical 02/22/2018  . Periodontitis 12/15/2017  . Vaginal dryness, menopausal 03/01/2017  . Abnormal ankle brachial index (ABI) 11/09/2016  . Numbness and tingling of both feet 09/02/2016  . Ganglion cyst of right foot 07/12/2016  . History of colonic polyps 03/20/2015  . Hyperlipidemia 02/20/2015  . Other allergic rhinitis 01/14/2015  . Prediabetes 10/17/2014  . Osteoarthritis of first metatarsophalangeal  joint 04/11/2014  . 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 No blood products 03/28/2013  . Essential hypertension 03/01/2013  . Primary osteoarthritis of both knees 12/27/2012    Past Surgical History:  Procedure Laterality Date  . TUBAL LIGATION  12/26/1987    OB History    Gravida  5   Para  3   Term  3   Preterm      AB  2   Living  3     SAB      TAB  2   Ectopic      Multiple      Live Births               Home Medications    Prior to Admission medications   Medication Sig Start Date End Date Taking? Authorizing Provider  amoxicillin-clavulanate (AUGMENTIN) 875-125 MG tablet Take 1 tablet by mouth 2 (two) times daily. 05/14/20   Luetta Nutting, DO  cetirizine HCl (ZYRTEC) 1 MG/ML solution Take 5 mg by mouth daily. 03/10/20   [provider]  conjugated estrogens (PREMARIN) vaginal cream Place 1 Applicatorful vaginally daily. For first 2 weeks then decrease to 3 times a week. 09/12/18   Breeback, Royetta Car, PA-C  hydrochlorothiazide (HYDRODIURIL) 25 MG tablet Take 1 tablet (25 mg total) by mouth daily. 05/15/19   Cirigliano, Mary K, DO  ipratropium (ATROVENT) 0.03 % nasal spray Place 2 sprays into both nostrils 4 (four) times daily.    [provider]  ketoconazole (NIZORAL) 2 % cream Apply 1 application topically 2 (  two) times daily. To affected areas. 04/27/20   Orma Render, NP  Multiple Vitamins-Minerals (MULTIVITAMIN ADULT PO) Take by mouth.    [provider]  oxybutynin (DITROPAN-XL) 5 MG 24 hr tablet  05/05/20   [provider]  pantoprazole (PROTONIX) 40 MG tablet  05/05/20   [provider]  valACYclovir (VALTREX) 1000 MG tablet Take 1 tablet (1,000 mg total) by mouth 3 (three) times daily. 04/27/20   Orma Render, NP    Family History Family History  Problem Relation Age of Onset  . Hypertension Mother   . CAD Father        MI in his 89s  . Allergic rhinitis Father   . Urticaria Sister   . Colon cancer  Sister   . Allergic rhinitis Son   . Asthma Son   . Angioedema Neg Hx   . Eczema Neg Hx   . Immunodeficiency Neg Hx   . Esophageal cancer Neg Hx   . Stomach cancer Neg Hx   . Rectal cancer Neg Hx     Social History Social History   Tobacco Use  . Smoking status: Former Smoker    Packs/day: 1.50    Types: Cigarettes    Quit date: 02/05/2006    Years since quitting: 14.6  . Smokeless tobacco: Never Used  Vaping Use  . Vaping Use: Never used  Substance Use Topics  . Alcohol use: Yes    Comment: rarely  . Drug use: No     Allergies   Azithromycin and Clindamycin/lincomycin   Review of Systems Review of Systems Pertinent negatives listed in HPI  Physical Exam Triage Vital Signs ED Triage Vitals  Enc Vitals Group     BP 10/11/20 1140 103/68     Pulse Rate 10/11/20 1140 89     Resp 10/11/20 1140 18     Temp 10/11/20 1140 98.6 F (37 C)     Temp Source 10/11/20 1140 Oral     SpO2 10/11/20 1140 97 %     Weight 10/11/20 1142 180 lb (81.6 kg)     Height 10/11/20 1142 5\' 3"  (1.6 m)     Head Circumference --      Peak Flow --      Pain Score 10/11/20 1141 6     Pain Loc --      Pain Edu? --      Excl. in [place]? --    No data found.  Updated Vital Signs BP 103/68 (BP Location: Left Arm)   Pulse 89   Temp 98.6 F (37 C) (Oral)   Resp 18   Ht 5\' 3"  (1.6 m)   Wt 180 lb (81.6 kg)   LMP 09/24/2017   SpO2 97%   BMI 31.89 kg/m   Visual Acuity Right Eye Distance:   Left Eye Distance:   Bilateral Distance:    Right Eye Near:   Left Eye Near:    Bilateral Near:     Physical Exam Constitutional:      Appearance: She is obese. She is not ill-appearing.  HENT:     Head:      Mouth/Throat:   Cardiovascular:     Rate and Rhythm: Normal rate and regular rhythm.  Pulmonary:     Effort: Pulmonary effort is normal.     Breath sounds: Normal breath sounds.  Neurological:     Mental Status: She is alert.      UC Treatments / Results  Labs (all labs  ordered are listed, but only abnormal results are displayed) Labs Reviewed - No data to display  EKG   Radiology No results found.  Procedures Procedures (including critical care time)  Medications Ordered in UC Medications - No data to display  Initial Impression / Assessment and Plan / UC Course  I have reviewed the triage vital signs and the nursing notes.  Pertinent labs & imaging results that were available during my care of the patient were reviewed by me and considered in my medical decision making (see chart for details).    Treated for dentalgia and gingivitis see discharge medication. Advised to follow-up with dentist.  Precautions given related to signs and symptoms of worsening facial infection.  Patient given strict ER precautions if swelling worsens or does not resolve with current treatment.  Patient verbalized understanding and agreement with plan.  Final Clinical Impressions(s) / UC Diagnoses   Final diagnoses:  Dentalgia  Gingivitis   Discharge Instructions   None    ED Prescriptions    Medication Sig Dispense Auth. Provider   amoxicillin-clavulanate (AUGMENTIN) 875-125 MG tablet Take 1 tablet by mouth 2 (two) times daily. 20 tablet Scot Jun, FNP   chlorhexidine (PERIDEX) 0.12 % solution Use as directed 15 mLs in the mouth or throat 2 (two) times daily. 120 mL Scot Jun, FNP     PDMP not reviewed this encounter.   Scot Jun, FNP 10/18/20 1052

## 2020-12-16 ENCOUNTER — Ambulatory Visit: Admitting: Family Medicine

## 2020-12-16 DIAGNOSIS — Z0001 Encounter for general adult medical examination with abnormal findings: Secondary | ICD-10-CM | POA: Diagnosis not present

## 2020-12-16 DIAGNOSIS — Z136 Encounter for screening for cardiovascular disorders: Secondary | ICD-10-CM | POA: Diagnosis not present

## 2020-12-16 DIAGNOSIS — Z131 Encounter for screening for diabetes mellitus: Secondary | ICD-10-CM | POA: Diagnosis not present

## 2020-12-16 DIAGNOSIS — Z1329 Encounter for screening for other suspected endocrine disorder: Secondary | ICD-10-CM | POA: Diagnosis not present

## 2021-01-07 ENCOUNTER — Emergency Department (INDEPENDENT_AMBULATORY_CARE_PROVIDER_SITE_OTHER)
Admission: EM | Admit: 2021-01-07 | Discharge: 2021-01-07 | Disposition: A | Discharge status: Home / Self Care | Payer: Medicare Other | Source: Home / Self Care

## 2021-01-07 ENCOUNTER — Encounter: Payer: Self-pay | Admitting: *Deleted

## 2021-01-07 ENCOUNTER — Other Ambulatory Visit: Payer: Self-pay

## 2021-01-07 ENCOUNTER — Telehealth: Payer: Self-pay

## 2021-01-07 DIAGNOSIS — G8929 Other chronic pain: Secondary | ICD-10-CM

## 2021-01-07 DIAGNOSIS — M25462 Effusion, left knee: Secondary | ICD-10-CM | POA: Diagnosis not present

## 2021-01-07 DIAGNOSIS — M25562 Pain in left knee: Secondary | ICD-10-CM

## 2021-01-07 DIAGNOSIS — K047 Periapical abscess without sinus: Secondary | ICD-10-CM

## 2021-01-07 MED ORDER — PENICILLIN V POTASSIUM 500 MG PO TABS: 500.0000 mg | ORAL_TABLET | Freq: Two times a day (BID) | ORAL | 0 refills | Status: DC

## 2021-01-07 MED ORDER — CEPHALEXIN 500 MG PO CAPS
500.0000 mg | ORAL_CAPSULE | Freq: Four times a day (QID) | ORAL | 0 refills | Status: DC
Start: 2021-01-07 — End: 2021-01-27

## 2021-01-07 NOTE — Telephone Encounter (Signed)
TC from pt to report that PCN Rx was sent to the wrong pharmacy [facility]). Also reports that PCN "does nothing for me..it's like water to me." Informed that I would notify Dr. Meda Coffee of the above information. Dr. Meda Coffee made aware and sends new Rx to [facility].

## 2021-01-07 NOTE — Discharge Instructions (Signed)
Continue with aleve or advil for pain Take the antibiotic 2 x a day Use ice on knee for 20 min every couple of hours No heat for a couple of days

## 2021-01-07 NOTE — ED Triage Notes (Signed)
Patient c/o posterior left knee pain x 2 weeks ago without injury.  She also c/o tooth pain and infection. H/o parodonitis.

## 2021-01-07 NOTE — ED Provider Notes (Signed)
[facility]    CSN: [id number] Arrival date & time: 01/07/21  1015      History   Chief Complaint Chief Complaint  Patient presents with  . Knee Pain    Posterior left knee  . Dental Pain    HPI Janet Richards is a 60 y.o. female.   HPI Has 2 issues 1. Chronic dental pain from periodontal disease and caries 2. Swelling and warmth of the left knee without trauma States she got antibiotics a few weeks ago that helped with her teeth but was unable to follow up with her denitst States she had an injection in her knee that lasted 3 years and would like another Past Medical History:  Diagnosis Date  . Asthma   . Chronic pancreatitis ([place])   . Essential hypertension, benign   . Menstrual migraine   . Periodontitis   . Prediabetes 10/17/2014  . Primary osteoarthritis of both knees   . Sinusitis   . Urticaria     Patient Active Problem List   Diagnosis Date Noted  . Vaccine counseling 04/09/2020  . Palpitations 03/09/2020  . Irritation of ear, bilateral 03/09/2020  . Generalized headaches 02/18/2020  . GERD (gastroesophageal reflux disease) 02/18/2020  . Dysuria 09/16/2019  . Chronic pancreatitis ([place]) 04/10/2019  . Environmental allergies 09/12/2018  . Mild intermittent asthma without complication 23/76/2831  . Dyspepsia 07/31/2018  . Sensory neuronopathy 07/29/2018  . Post-menopausal bleeding 05/21/2018  . DDD (degenerative disc disease), cervical 02/22/2018  . Periodontitis 12/15/2017  . Vaginal dryness, menopausal 03/01/2017  . Abnormal ankle brachial index (ABI) 11/09/2016  . Numbness and tingling of both feet 09/02/2016  . Ganglion cyst of right foot 07/12/2016  . History of colonic polyps 03/20/2015  . Hyperlipidemia 02/20/2015  . Other allergic rhinitis 01/14/2015  . Prediabetes 10/17/2014  . Osteoarthritis of first metatarsophalangeal joint 04/11/2014  . 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 No blood products 03/28/2013  . Essential  hypertension 03/01/2013  . Primary osteoarthritis of both knees 12/27/2012    Past Surgical History:  Procedure Laterality Date  . TUBAL LIGATION  12/26/1987    OB History    Gravida  5   Para  3   Term  3   Preterm      AB  2   Living  3     SAB      IAB  2   Ectopic      Multiple      Live Births               Home Medications    Prior to Admission medications   Medication Sig Start Date End Date Taking? Authorizing Provider  cephALEXin (KEFLEX) 500 MG capsule Take 1 capsule (500 mg total) by mouth 4 (four) times daily. 01/07/21  Yes Raylene Everts, MD  cetirizine HCl (ZYRTEC) 1 MG/ML solution Take 5 mg by mouth daily. 03/10/20   [provider]  conjugated estrogens (PREMARIN) vaginal cream Place 1 Applicatorful vaginally daily. For first 2 weeks then decrease to 3 times a week. 09/12/18   Breeback, Royetta Car, PA-C  hydrochlorothiazide (HYDRODIURIL) 25 MG tablet Take 1 tablet (25 mg total) by mouth daily. 05/15/19   Cirigliano, Mary K, DO  ipratropium (ATROVENT) 0.03 % nasal spray Place 2 sprays into both nostrils 4 (four) times daily.    [provider]  ketoconazole (NIZORAL) 2 % cream Apply 1 application topically 2 (two) times daily. To affected areas. 04/27/20   Early,  Coralee Pesa, NP  Multiple Vitamins-Minerals (MULTIVITAMIN ADULT PO) Take by mouth.    [provider]  pantoprazole (PROTONIX) 40 MG tablet  05/05/20   [provider]  valACYclovir (VALTREX) 1000 MG tablet Take 1 tablet (1,000 mg total) by mouth 3 (three) times daily. 04/27/20   Orma Render, NP    Family History Family History  Problem Relation Age of Onset  . Hypertension Mother   . CAD Father        MI in his 55s  . Allergic rhinitis Father   . Urticaria Sister   . Colon cancer Sister   . Allergic rhinitis Son   . Asthma Son   . Angioedema Neg Hx   . Eczema Neg Hx   . Immunodeficiency Neg Hx   . Esophageal cancer Neg Hx   . Stomach cancer Neg Hx    . Rectal cancer Neg Hx     Social History Social History   Tobacco Use  . Smoking status: Former Smoker    Packs/day: 1.50    Types: Cigarettes    Quit date: 02/05/2006    Years since quitting: 14.9  . Smokeless tobacco: Never Used  Vaping Use  . Vaping Use: Never used  Substance Use Topics  . Alcohol use: Yes    Comment: rarely  . Drug use: No     Allergies   Azithromycin and Clindamycin/lincomycin   Review of Systems Review of Systemssee  See HPI  Physical Exam Triage Vital Signs ED Triage Vitals  Enc Vitals Group     BP 01/07/21 1033 (!) 146/84     Pulse Rate 01/07/21 1033 93     Resp 01/07/21 1033 16     Temp 01/07/21 1033 98.8 F (37.1 C)     Temp Source 01/07/21 1033 Oral     SpO2 01/07/21 1033 97 %     Weight --      Height --      Head Circumference --      Peak Flow --      Pain Score 01/07/21 1035 8     Pain Loc --      Pain Edu? --      Excl. in [place]? --    No data found.  Updated Vital Signs BP (!) 146/84 (BP Location: Left Arm)   Pulse 93   Temp 98.8 F (37.1 C) (Oral)   Resp 16   LMP 09/24/2017   SpO2 97%      Physical Exam Constitutional:      General: She is not in acute distress.    Appearance: She is well-developed, normal weight and well-nourished.  HENT:     Head: Normocephalic and atraumatic.     Right Ear: Tympanic membrane, ear canal and external ear normal.     Left Ear: Tympanic membrane, ear canal and external ear normal.     Mouth/Throat:     Mouth: Oropharynx is clear and moist.     Comments: Missing teeth, red gums Eyes:     Conjunctiva/sclera: Conjunctivae normal.     Pupils: Pupils are equal, round, and reactive to light.  Cardiovascular:     Rate and Rhythm: Normal rate.  Pulmonary:     Effort: Pulmonary effort is normal. No respiratory distress.  Abdominal:     General: There is no distension.     Palpations: Abdomen is soft.  Musculoskeletal:        General: Tenderness present. No swelling or edema.  Normal  range of motion.     Cervical back: Normal range of motion.     Comments: Left knee with warmth, crepitus, effusion without instability  Skin:    General: Skin is warm and dry.  Neurological:     Mental Status: She is alert.     Gait: Gait abnormal.      UC Treatments / Results  Labs (all labs ordered are listed, but only abnormal results are displayed) Labs Reviewed - No data to display  EKG   Radiology No results found.  Procedures Join Aspiration/Injection  Date/Time: 01/07/2021 1:06 PM Performed by: Raylene Everts, MD Authorized by: Raylene Everts, MD   Consent:    Consent obtained:  Verbal   Consent given by:  Patient   Risks discussed:  Infection Universal protocol:    Patient identity confirmed:  Arm band Location:    Location:  Knee   Knee:  L knee Anesthesia:    Anesthesia method:  Local infiltration   Local anesthetic:  Lidocaine 1% w/o epi Procedure details:    Aspirate amount:  None   Steroid injected: yes     Specimen collected: no   Post-procedure details:    Dressing:  Adhesive bandage   Procedure completion:  Tolerated well, no immediate complications Comments:     80 cc of depomedrol:  [id number]/ Exp 10/22    Medications Ordered in UC Medications - No data to display  Initial Impression / Assessment and Plan / UC Course  I have reviewed the triage vital signs and the nursing notes.  Pertinent labs & imaging results that were available during my care of the patient were reviewed by me and considered in my medical decision making (see chart for details).     Post injection care discussed Must see dentist Final Clinical Impressions(s) / UC Diagnoses   Final diagnoses:  Knee effusion, left  Dental infection  Chronic pain of left knee     Discharge Instructions     Continue with aleve or advil for pain Take the antibiotic 2 x a day Use ice on knee for 20 min every couple of hours No heat for a couple of days   ED  Prescriptions    Medication Sig Dispense Auth. Provider   penicillin v potassium (VEETID) 500 MG tablet  (Status: Discontinued) Take 1 tablet (500 mg total) by mouth 2 (two) times daily for 10 days. 20 tablet Raylene Everts, MD   cephALEXin (KEFLEX) 500 MG capsule Take 1 capsule (500 mg total) by mouth 4 (four) times daily. 20 capsule Raylene Everts, MD     PDMP not reviewed this encounter.   Raylene Everts, MD 01/07/21 [phone]

## 2021-01-27 ENCOUNTER — Emergency Department (INDEPENDENT_AMBULATORY_CARE_PROVIDER_SITE_OTHER)
Admission: EM | Admit: 2021-01-27 | Discharge: 2021-01-27 | Disposition: A | Discharge status: Home / Self Care | Payer: Medicare Other | Source: Home / Self Care

## 2021-01-27 ENCOUNTER — Other Ambulatory Visit: Payer: Self-pay

## 2021-01-27 ENCOUNTER — Encounter: Payer: Self-pay | Admitting: Emergency Medicine

## 2021-01-27 DIAGNOSIS — K0889 Other specified disorders of teeth and supporting structures: Secondary | ICD-10-CM

## 2021-01-27 DIAGNOSIS — R519 Headache, unspecified: Secondary | ICD-10-CM

## 2021-01-27 DIAGNOSIS — K047 Periapical abscess without sinus: Secondary | ICD-10-CM

## 2021-01-27 DIAGNOSIS — R079 Chest pain, unspecified: Secondary | ICD-10-CM

## 2021-01-27 DIAGNOSIS — I10 Essential (primary) hypertension: Secondary | ICD-10-CM

## 2021-01-27 MED ORDER — AMOXICILLIN-POT CLAVULANATE 875-125 MG PO TABS: 1.0000 | ORAL_TABLET | Freq: Two times a day (BID) | ORAL | 0 refills | Status: AC

## 2021-01-27 NOTE — Discharge Instructions (Addendum)
I have sent in Augmentin for you to take twice a day for 7 days.  Follow up with dentistry as scheduled  Your EKG was normal today  Continue blood pressure medications as prescribed  Follow up with this office or with primary care if symptoms are persisting.  Follow up in the ER for high fever, trouble swallowing, trouble breathing, other concerning symptoms.

## 2021-01-27 NOTE — ED Triage Notes (Signed)
Patient presents to Urgent Care with complaints of chest pain under left breast since today. Patient reports sharp pain. She does have reflux and took Protonix. Denies any arm pain

## 2021-01-28 NOTE — ED Provider Notes (Signed)
[name]    CSN: [id number] Arrival date & time: 01/27/21  1941      History   Chief Complaint Chief Complaint  Patient presents with  . Chest Pain    HPI Janet Richards is a 60 y.o. female.   Reports chest pain under the left breast today. Reports that she has GERD and that she took protonix. Reports headache and states that she thinks that her blood pressure is elevated. Reports dental pain and known infection as well. Reports that she has an appt for the dentist 02/05/21. Reports that she thinks the infection in her mouth is attributing to her other symptoms today. Reports chest pain in the past with GERD. Reports that her family has HTN and heart troubles. She has medical hx of chronic pancreatitis, periodontitis, GERD, HTN, prediabtes. Denies fatigue, SOB, diaphoresis, radiating pain, arm pain, neck pain, jaw pain, dizziness. Denies current chest pain.   ROS per HPI  The history is provided by the patient.  Chest Pain   Past Medical History:  Diagnosis Date  . Asthma   . Chronic pancreatitis ([place])   . Essential hypertension, benign   . Menstrual migraine   . Periodontitis   . Prediabetes 10/17/2014  . Primary osteoarthritis of both knees   . Sinusitis   . Urticaria     Patient Active Problem List   Diagnosis Date Noted  . Vaccine counseling 04/09/2020  . Palpitations 03/09/2020  . Irritation of ear, bilateral 03/09/2020  . Generalized headaches 02/18/2020  . GERD (gastroesophageal reflux disease) 02/18/2020  . Dysuria 09/16/2019  . Chronic pancreatitis ([place]) 04/10/2019  . Environmental allergies 09/12/2018  . Mild intermittent asthma without complication 34/74/2595  . Dyspepsia 07/31/2018  . Sensory neuronopathy 07/29/2018  . Post-menopausal bleeding 05/21/2018  . DDD (degenerative disc disease), cervical 02/22/2018  . Periodontitis 12/15/2017  . Vaginal dryness, menopausal 03/01/2017  . Abnormal ankle brachial index (ABI) 11/09/2016  .  Numbness and tingling of both feet 09/02/2016  . Ganglion cyst of right foot 07/12/2016  . History of colonic polyps 03/20/2015  . Hyperlipidemia 02/20/2015  . Other allergic rhinitis 01/14/2015  . Prediabetes 10/17/2014  . Osteoarthritis of first metatarsophalangeal joint 04/11/2014  . 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 No blood products 03/28/2013  . Essential hypertension 03/01/2013  . Primary osteoarthritis of both knees 12/27/2012    Past Surgical History:  Procedure Laterality Date  . TUBAL LIGATION  12/26/1987    OB History    Gravida  5   Para  3   Term  3   Preterm      AB  2   Living  3     SAB      IAB  2   Ectopic      Multiple      Live Births               Home Medications    Prior to Admission medications   Medication Sig Start Date End Date Taking? Authorizing Provider  amoxicillin-clavulanate (AUGMENTIN) 875-125 MG tablet Take 1 tablet by mouth 2 (two) times daily for 10 days. 01/27/21 02/06/21 Yes Faustino Congress, NP  cetirizine HCl (ZYRTEC) 1 MG/ML solution Take 5 mg by mouth daily. 03/10/20   [provider]  conjugated estrogens (PREMARIN) vaginal cream Place 1 Applicatorful vaginally daily. For first 2 weeks then decrease to 3 times a week. 09/12/18   Breeback, Jade L, PA-C  hydrochlorothiazide (HYDRODIURIL) 25 MG tablet Take 1 tablet (25  mg total) by mouth daily. 05/15/19   Cirigliano, Mary K, DO  ipratropium (ATROVENT) 0.03 % nasal spray Place 2 sprays into both nostrils 4 (four) times daily.    [provider]  ketoconazole (NIZORAL) 2 % cream Apply 1 application topically 2 (two) times daily. To affected areas. 04/27/20   Orma Render, NP  Multiple Vitamins-Minerals (MULTIVITAMIN ADULT PO) Take by mouth.    [provider]  pantoprazole (PROTONIX) 40 MG tablet  05/05/20   [provider]  valACYclovir (VALTREX) 1000 MG tablet Take 1 tablet (1,000 mg total) by mouth 3 (three) times daily. 04/27/20    Orma Render, NP    Family History Family History  Problem Relation Age of Onset  . Hypertension Mother   . CAD Father        MI in his 102s  . Allergic rhinitis Father   . Urticaria Sister   . Colon cancer Sister   . Allergic rhinitis Son   . Asthma Son   . Angioedema Neg Hx   . Eczema Neg Hx   . Immunodeficiency Neg Hx   . Esophageal cancer Neg Hx   . Stomach cancer Neg Hx   . Rectal cancer Neg Hx     Social History Social History   Tobacco Use  . Smoking status: Former Smoker    Packs/day: 1.50    Types: Cigarettes    Quit date: 02/05/2006    Years since quitting: 14.9  . Smokeless tobacco: Never Used  Vaping Use  . Vaping Use: Never used  Substance Use Topics  . Alcohol use: Yes    Comment: rarely  . Drug use: No     Allergies   Azithromycin and Clindamycin/lincomycin   Review of Systems Review of Systems  Cardiovascular: Positive for chest pain.     Physical Exam Triage Vital Signs ED Triage Vitals  Enc Vitals Group     BP 01/27/21 1953 (!) 141/99     Pulse Rate 01/27/21 1953 87     Resp --      Temp 01/27/21 1953 97.7 F (36.5 C)     Temp Source 01/27/21 1953 Oral     SpO2 01/27/21 1953 97 %     Weight --      Height --      Head Circumference --      Peak Flow --      Pain Score 01/27/21 2015 6     Pain Loc --      Pain Edu? --      Excl. in [place]? --    No data found.  Updated Vital Signs BP (!) 141/99 (BP Location: Left Arm)   Pulse 87   Temp 97.7 F (36.5 C) (Oral)   LMP 09/24/2017   SpO2 97%   Visual Acuity Right Eye Distance:   Left Eye Distance:   Bilateral Distance:    Right Eye Near:   Left Eye Near:    Bilateral Near:     Physical Exam Vitals and nursing note reviewed.  Constitutional:      General: She is not in acute distress.    Appearance: She is well-developed. She is obese. She is not ill-appearing.  HENT:     Head: Normocephalic and atraumatic.     Mouth/Throat:     Lips: Pink.     Mouth: Mucous  membranes are moist.     Dentition: Gingival swelling present.     Pharynx: Oropharynx is clear.  Eyes:     Extraocular Movements: Extraocular movements intact.     Conjunctiva/sclera: Conjunctivae normal.     Pupils: Pupils are equal, round, and reactive to light.  Neck:     Thyroid: No thyromegaly.     Vascular: No hepatojugular reflux or JVD.     Trachea: No tracheal deviation.  Cardiovascular:     Rate and Rhythm: Normal rate and regular rhythm.     Pulses:          Carotid pulses are 2+ on the right side and 2+ on the left side.      Radial pulses are 2+ on the right side and 2+ on the left side.       Dorsalis pedis pulses are 2+ on the right side and 2+ on the left side.     Heart sounds: Normal heart sounds. Heart sounds not distant. No murmur heard.  No systolic murmur is present.  No diastolic murmur is present. No friction rub. No gallop. No S3 sounds.   Pulmonary:     Effort: Pulmonary effort is normal. No tachypnea, accessory muscle usage or respiratory distress.     Breath sounds: Normal breath sounds. No stridor.  Abdominal:     Palpations: Abdomen is soft.     Tenderness: There is no abdominal tenderness.  Musculoskeletal:        General: Normal range of motion.     Cervical back: Normal range of motion and neck supple.  Lymphadenopathy:     Cervical: No cervical adenopathy.  Skin:    General: Skin is warm and dry.     Capillary Refill: Capillary refill takes less than 2 seconds.  Neurological:     General: No focal deficit present.     Mental Status: She is alert and oriented to person, place, and time.  Psychiatric:        Mood and Affect: Mood normal.      UC Treatments / Results  Labs (all labs ordered are listed, but only abnormal results are displayed) Labs Reviewed - No data to display  EKG   Radiology No results found.  Procedures Procedures (including critical care time)  Medications Ordered in UC Medications - No data to  display  Initial Impression / Assessment and Plan / UC Course  I have reviewed the triage vital signs and the nursing notes.  Pertinent labs & imaging results that were available during my care of the patient were reviewed by me and considered in my medical decision making (see chart for details).    Chest Pain  Headache HTN Dental Pain Dental Infection  EKG shows NSR today, reassuring Low suspicion for ACS given no current chest pain, normal EKG, no fatigue, SOB, dyspnea Prescribed Augmentin 875 BID x 7 days Continue home blood pressure medications Drink plenty of fluids Follow up with dentistry as scheduled If symptoms return or persist, follow up with the ER as needed   Final Clinical Impressions(s) / UC Diagnoses   Final diagnoses:  Chest pain, unspecified type  Nonintractable headache, unspecified chronicity pattern, unspecified headache type  Essential hypertension  Pain, dental  Dental infection     Discharge Instructions     I have sent in Augmentin for you to take twice a day for 7 days.  Follow up with dentistry as scheduled  Your EKG was normal today  Continue blood pressure medications as prescribed  Follow up with this office or with primary care if symptoms are persisting.  Follow up  in the ER for high fever, trouble swallowing, trouble breathing, other concerning symptoms.     ED Prescriptions    Medication Sig Dispense Auth. Provider   amoxicillin-clavulanate (AUGMENTIN) 875-125 MG tablet Take 1 tablet by mouth 2 (two) times daily for 10 days. 20 tablet Faustino Congress, NP     PDMP not reviewed this encounter.   Faustino Congress, NP 01/28/21 1203

## 2021-02-03 ENCOUNTER — Emergency Department (INDEPENDENT_AMBULATORY_CARE_PROVIDER_SITE_OTHER)
Admission: EM | Admit: 2021-02-03 | Discharge: 2021-02-03 | Disposition: A | Discharge status: Home / Self Care | Payer: Medicare Other | Source: Home / Self Care | Attending: Family Medicine | Admitting: Family Medicine

## 2021-02-03 DIAGNOSIS — K219 Gastro-esophageal reflux disease without esophagitis: Secondary | ICD-10-CM | POA: Diagnosis not present

## 2021-02-03 DIAGNOSIS — M1711 Unilateral primary osteoarthritis, right knee: Secondary | ICD-10-CM

## 2021-02-03 DIAGNOSIS — R0789 Other chest pain: Secondary | ICD-10-CM

## 2021-02-03 MED ORDER — FAMOTIDINE 20 MG PO TABS: 20.0000 mg | ORAL_TABLET | Freq: Two times a day (BID) | ORAL | 0 refills | Status: DC

## 2021-02-03 MED ORDER — PREDNISONE 20 MG PO TABS: 20.0000 mg | ORAL_TABLET | Freq: Two times a day (BID) | ORAL | 0 refills | Status: DC

## 2021-02-03 MED ORDER — OMEPRAZOLE 40 MG PO CPDR: 40.0000 mg | DELAYED_RELEASE_CAPSULE | Freq: Every day | ORAL | 1 refills | Status: DC

## 2021-02-03 NOTE — ED Triage Notes (Addendum)
Pt presents to Urgent Care with c/o L posterior knee pain and intermittent chest pain x several weeks. She denies sob, L arm pain, nausea. Pt reports she does not currently have cp. States she was evaluated for this on 01/27/21. Denies known injury to L knee.

## 2021-02-03 NOTE — Discharge Instructions (Signed)
Take prednisone 2 times a day with food This is an anti-inflammatory for your knee pain and swelling Take omeprazole once a day.  This is instead of pantoprazole Take Pepcid 2 times a day.  This is in addition to the omeprazole.  This will help further reduce your stomach acid symptoms  Follow-up with your new primary care doctor in May

## 2021-02-03 NOTE — ED Provider Notes (Signed)
[facility]    CSN: [id number] Arrival date & time: 02/03/21  1405      History   Chief Complaint Chief Complaint  Patient presents with  . Knee Pain  . Chest Pain    HPI Janet Richards is a 60 y.o. female.   HPI   Patient is back with same problems XR week ago.  She has a new PCP appointment but is not until April.  She has chronic chest pain from GERD and states her GERD is not well managed on Protonix.  States she previously took omeprazole with good results.  Wants to know if she can go back on omeprazole.  Even on Protonix plus time she still has acid reflux symptoms.  I told her that this might require additional evaluation if it persists. She also states that her left knee is still painful and swollen.  Not as bad as before, but still bothering her.  She does have a brace that she can wear if needed.  No new fall or injury.  Past Medical History:  Diagnosis Date  . Asthma   . Chronic pancreatitis ([place])   . Essential hypertension, benign   . Menstrual migraine   . Periodontitis   . Prediabetes 10/17/2014  . Primary osteoarthritis of both knees   . Sinusitis   . Urticaria     Patient Active Problem List   Diagnosis Date Noted  . Vaccine counseling 04/09/2020  . Palpitations 03/09/2020  . Irritation of ear, bilateral 03/09/2020  . Generalized headaches 02/18/2020  . GERD (gastroesophageal reflux disease) 02/18/2020  . Dysuria 09/16/2019  . Chronic pancreatitis ([place]) 04/10/2019  . Environmental allergies 09/12/2018  . Mild intermittent asthma without complication 37/62/8315  . Dyspepsia 07/31/2018  . Sensory neuronopathy 07/29/2018  . Post-menopausal bleeding 05/21/2018  . DDD (degenerative disc disease), cervical 02/22/2018  . Periodontitis 12/15/2017  . Vaginal dryness, menopausal 03/01/2017  . Abnormal ankle brachial index (ABI) 11/09/2016  . Numbness and tingling of both feet 09/02/2016  . Ganglion cyst of right foot 07/12/2016  . History  of colonic polyps 03/20/2015  . Hyperlipidemia 02/20/2015  . Other allergic rhinitis 01/14/2015  . Prediabetes 10/17/2014  . Osteoarthritis of first metatarsophalangeal joint 04/11/2014  . 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 No blood products 03/28/2013  . Essential hypertension 03/01/2013  . Primary osteoarthritis of both knees 12/27/2012    Past Surgical History:  Procedure Laterality Date  . TUBAL LIGATION  12/26/1987    OB History    Gravida  5   Para  3   Term  3   Preterm      AB  2   Living  3     SAB      IAB  2   Ectopic      Multiple      Live Births               Home Medications    Prior to Admission medications   Medication Sig Start Date End Date Taking? Authorizing Provider  calcium-vitamin D 250-100 MG-UNIT tablet Take 1 tablet by mouth 2 (two) times daily.   Yes [provider]  famotidine (PEPCID) 20 MG tablet Take 1 tablet (20 mg total) by mouth 2 (two) times daily. 02/03/21  Yes Raylene Everts, MD  omeprazole (PRILOSEC) 40 MG capsule Take 1 capsule (40 mg total) by mouth daily. 02/03/21  Yes Raylene Everts, MD  predniSONE (DELTASONE) 20 MG tablet Take 1 tablet (20  mg total) by mouth 2 (two) times daily with a meal. 02/03/21  Yes Raylene Everts, MD  Turmeric 500 MG TABS Take by mouth.   Yes [provider]  vitamin B-12 (CYANOCOBALAMIN) 100 MCG tablet Take 100 mcg by mouth daily.   Yes [provider]  amoxicillin-clavulanate (AUGMENTIN) 875-125 MG tablet Take 1 tablet by mouth 2 (two) times daily for 10 days. 01/27/21 02/06/21  Faustino Congress, NP  cetirizine HCl (ZYRTEC) 1 MG/ML solution Take 5 mg by mouth daily. 03/10/20   [provider]  conjugated estrogens (PREMARIN) vaginal cream Place 1 Applicatorful vaginally daily. For first 2 weeks then decrease to 3 times a week. 09/12/18   Breeback, Royetta Car, PA-C  hydrochlorothiazide (HYDRODIURIL) 25 MG tablet Take 1 tablet (25 mg total) by mouth  daily. 05/15/19   Cirigliano, Mary K, DO  ipratropium (ATROVENT) 0.03 % nasal spray Place 2 sprays into both nostrils 4 (four) times daily.    [provider]  ketoconazole (NIZORAL) 2 % cream Apply 1 application topically 2 (two) times daily. To affected areas. 04/27/20   Orma Render, NP  Multiple Vitamins-Minerals (MULTIVITAMIN ADULT PO) Take by mouth.    [provider]  valACYclovir (VALTREX) 1000 MG tablet Take 1 tablet (1,000 mg total) by mouth 3 (three) times daily. 04/27/20   Orma Render, NP    Family History Family History  Problem Relation Age of Onset  . Hypertension Mother   . CAD Father        MI in his 58s  . Allergic rhinitis Father   . Urticaria Sister   . Colon cancer Sister   . Allergic rhinitis Son   . Asthma Son   . Angioedema Neg Hx   . Eczema Neg Hx   . Immunodeficiency Neg Hx   . Esophageal cancer Neg Hx   . Stomach cancer Neg Hx   . Rectal cancer Neg Hx     Social History Social History   Tobacco Use  . Smoking status: Former Smoker    Packs/day: 1.50    Types: Cigarettes    Quit date: 02/05/2006    Years since quitting: 15.0  . Smokeless tobacco: Never Used  Vaping Use  . Vaping Use: Never used  Substance Use Topics  . Alcohol use: Yes    Comment: rarely  . Drug use: No     Allergies   Azithromycin and Clindamycin/lincomycin   Review of Systems Review of Systems See HPI  Physical Exam Triage Vital Signs ED Triage Vitals  Enc Vitals Group     BP 02/03/21 1428 115/79     Pulse Rate 02/03/21 1428 95     Resp 02/03/21 1428 20     Temp 02/03/21 1428 98.5 F (36.9 C)     Temp Source 02/03/21 1428 Oral     SpO2 02/03/21 1428 99 %     Weight 02/03/21 1423 185 lb (83.9 kg)     Height 02/03/21 1423 5\' 3"  (1.6 m)     Head Circumference --      Peak Flow --      Pain Score 02/03/21 1422 9     Pain Loc --      Pain Edu? --      Excl. in [place]? --    No data found.  Updated Vital Signs BP 115/79 (BP Location: Right  Arm)   Pulse 95   Temp 98.5 F (36.9 C) (Oral)   Resp 20  Ht 5\' 3"  (1.6 m)   Wt 83.9 kg   LMP 09/24/2017   SpO2 99%   BMI 32.77 kg/m     Physical Exam Constitutional:      General: She is not in acute distress.    Appearance: She is well-developed. She is obese.  HENT:     Head: Normocephalic and atraumatic.     Mouth/Throat:     Comments: Mask is in place Eyes:     Conjunctiva/sclera: Conjunctivae normal.     Pupils: Pupils are equal, round, and reactive to light.  Cardiovascular:     Rate and Rhythm: Normal rate.  Pulmonary:     Effort: Pulmonary effort is normal. No respiratory distress.     Comments: .  Heart and lung exam normal Abdominal:     General: Abdomen is flat. There is no distension.     Palpations: Abdomen is soft.     Comments: Mild midepigastric tenderness  Musculoskeletal:        General: Normal range of motion.     Cervical back: Normal range of motion.     Comments: The left knee has trace effusion.  Definite warmth.  Mild tenderness posteriorly.  No instability  Skin:    General: Skin is warm and dry.  Neurological:     Mental Status: She is alert.  Psychiatric:        Behavior: Behavior normal.      UC Treatments / Results  Labs (all labs ordered are listed, but only abnormal results are displayed) Labs Reviewed - No data to display  EKG   Radiology No results found.  Procedures Procedures (including critical care time)  Medications Ordered in UC Medications - No data to display  Initial Impression / Assessment and Plan / UC Course  I have reviewed the triage vital signs and the nursing notes.  Pertinent labs & imaging results that were available during my care of the patient were reviewed by me and considered in my medical decision making (see chart for details).     Reinforced that the knee pain is chronic.  She needs to be under the care of her primary care doctor with referral to orthopedics if it persists Ongoing to  put her back on omeprazole and start Protonix.  Going to add Pepcid and see if this helps her.  Again follow-up with PCP Final Clinical Impressions(s) / UC Diagnoses   Final diagnoses:  Gastroesophageal reflux disease, unspecified whether esophagitis present  Arthritis of right knee  Chest pain, atypical     Discharge Instructions     Take prednisone 2 times a day with food This is an anti-inflammatory for your knee pain and swelling Take omeprazole once a day.  This is instead of pantoprazole Take Pepcid 2 times a day.  This is in addition to the omeprazole.  This will help further reduce your stomach acid symptoms  Follow-up with your new primary care doctor in May   ED Prescriptions    Medication Sig Dispense Auth. Provider   omeprazole (PRILOSEC) 40 MG capsule Take 1 capsule (40 mg total) by mouth daily. 30 capsule Raylene Everts, MD   famotidine (PEPCID) 20 MG tablet Take 1 tablet (20 mg total) by mouth 2 (two) times daily. 30 tablet Raylene Everts, MD   predniSONE (DELTASONE) 20 MG tablet Take 1 tablet (20 mg total) by mouth 2 (two) times daily with a meal. 10 tablet Raylene Everts, MD     PDMP not  reviewed this encounter.   Raylene Everts, MD 02/03/21 [phone]

## 2021-02-14 IMAGING — DX DG CHEST 2V
2 series · 2 of 2 positions shown · non-contrast
Comparison: Chest radiograph 01/22/2019

CLINICAL DATA: Pt with RT sided chest pain and SOB since [REDACTED].
Ex-smoker

EXAM:
CHEST - 2 VIEW

[chest pa]
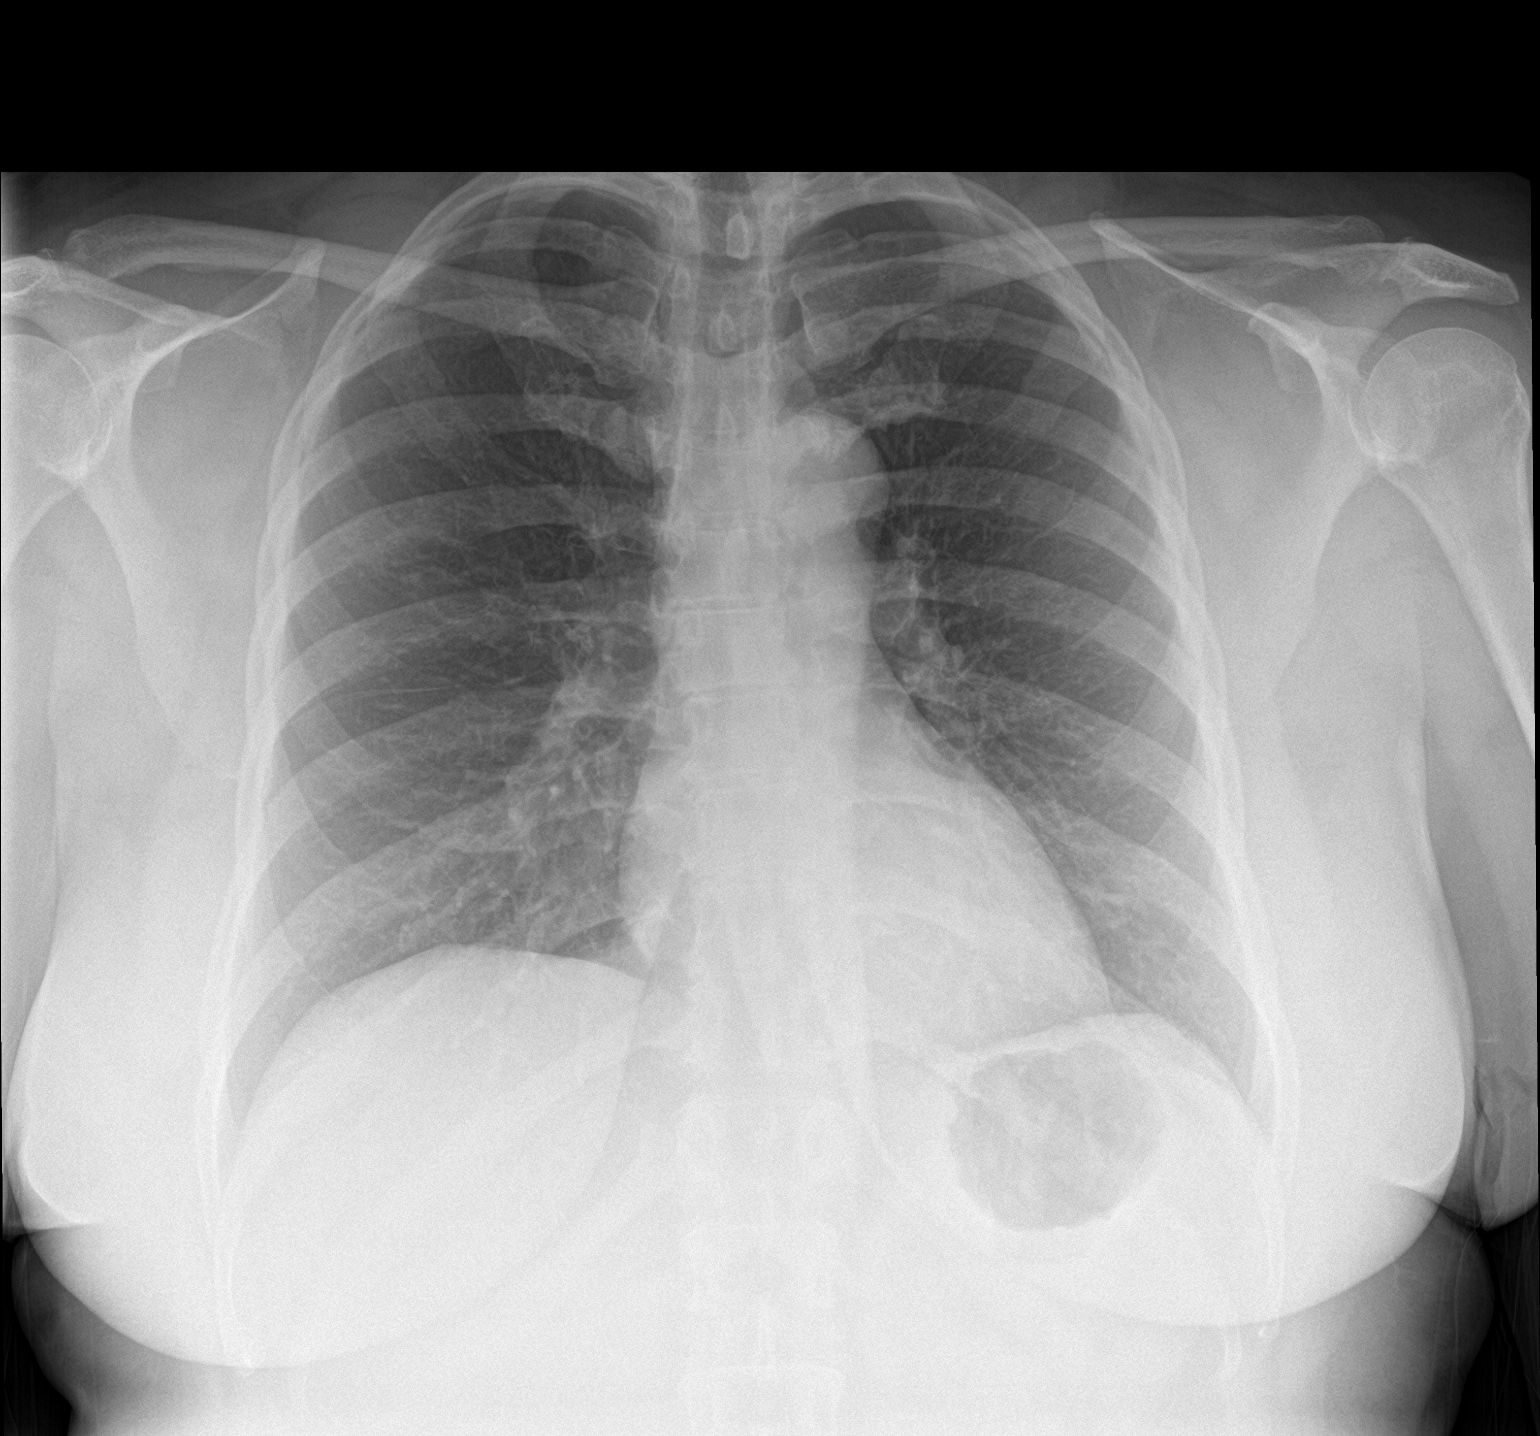

[chest lat]
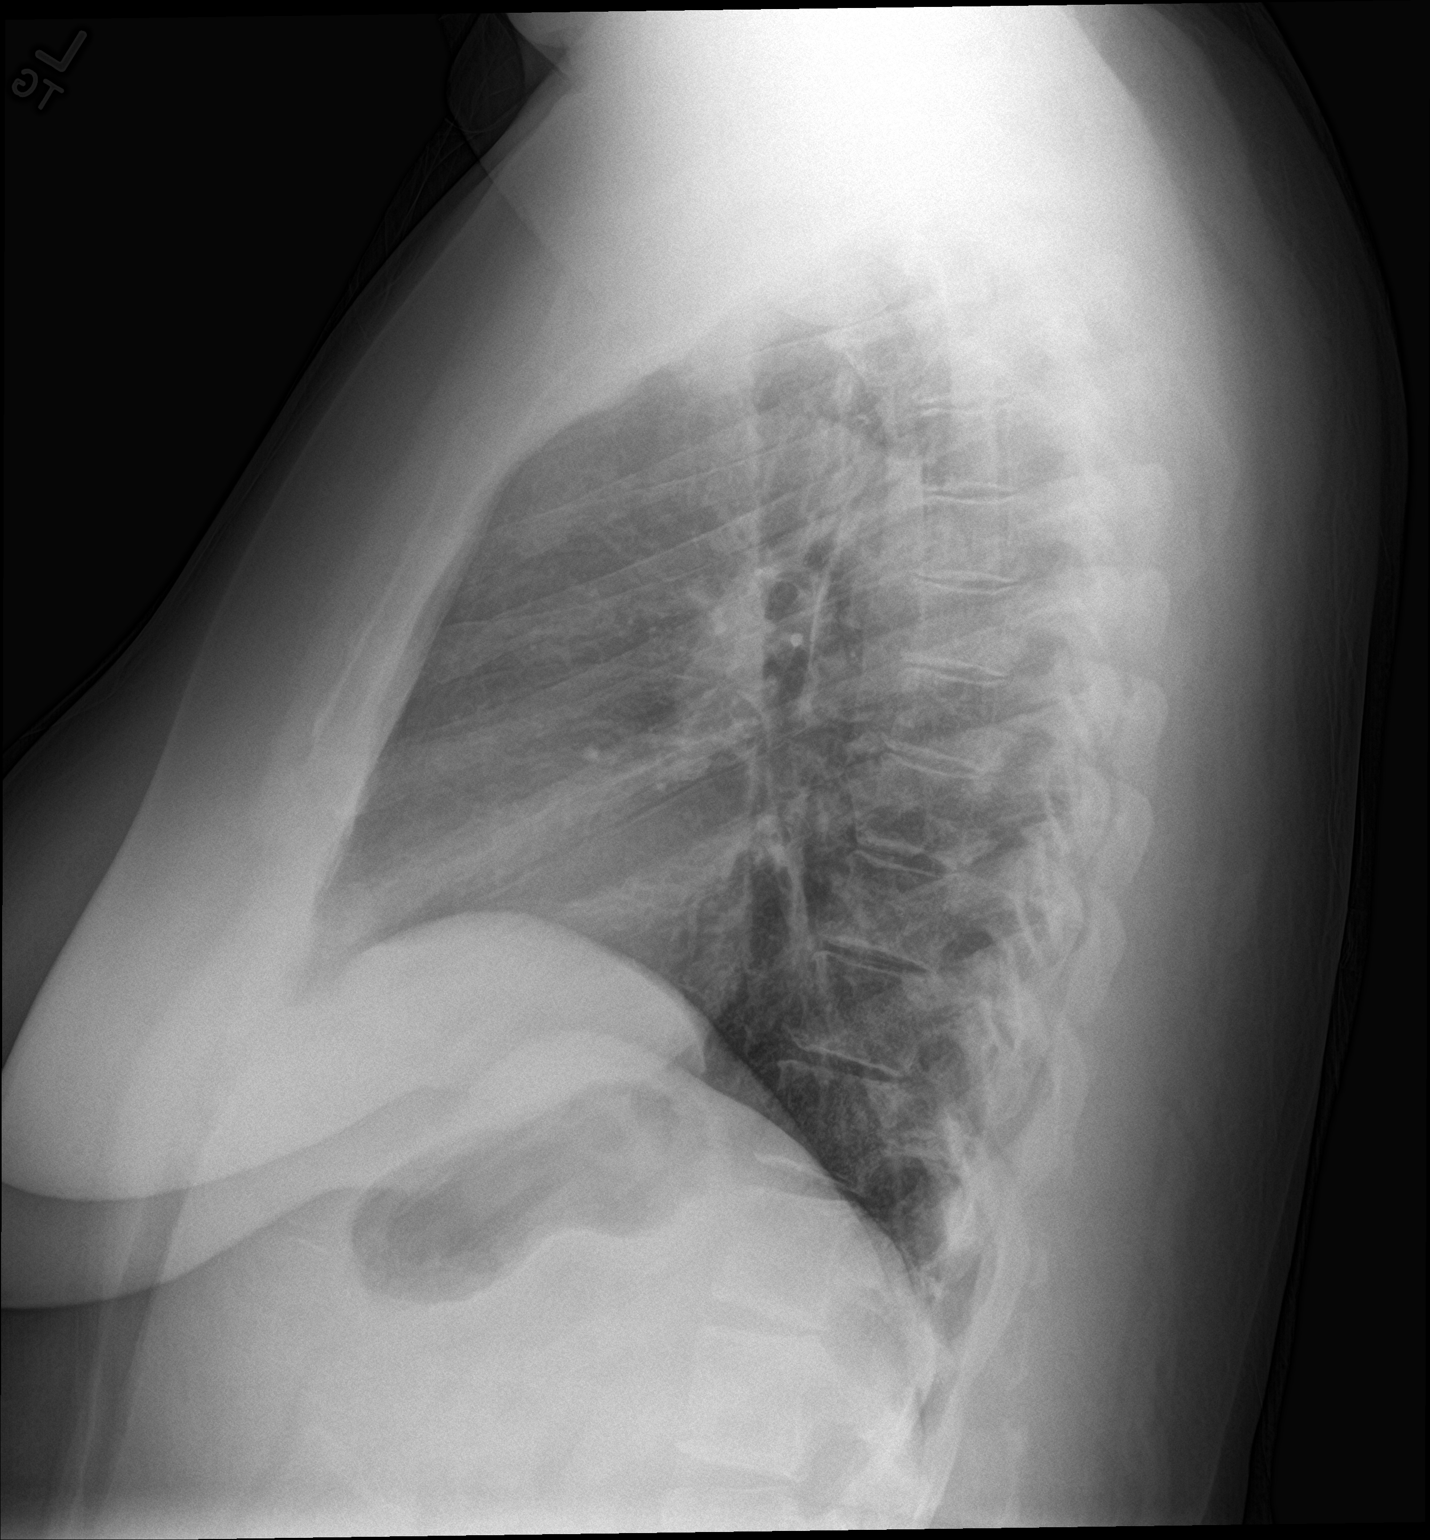

[2 of 2 positions shown; findings below may reference images not displayed]

FINDINGS: Stable cardiomediastinal contours are within normal limits. The
lungs are clear. No pneumothorax or pleural effusion. Unremarkable
visualized skeleton and upper abdomen.
IMPRESSION: No active cardiopulmonary disease.

## 2021-02-16 DIAGNOSIS — R918 Other nonspecific abnormal finding of lung field: Secondary | ICD-10-CM | POA: Diagnosis not present

## 2021-02-16 DIAGNOSIS — Z880 Allergy status to penicillin: Secondary | ICD-10-CM | POA: Diagnosis not present

## 2021-02-16 DIAGNOSIS — Z8616 Personal history of COVID-19: Secondary | ICD-10-CM | POA: Diagnosis not present

## 2021-02-16 DIAGNOSIS — Z886 Allergy status to analgesic agent status: Secondary | ICD-10-CM | POA: Diagnosis not present

## 2021-02-16 DIAGNOSIS — R0789 Other chest pain: Secondary | ICD-10-CM | POA: Diagnosis not present

## 2021-02-16 DIAGNOSIS — R7303 Prediabetes: Secondary | ICD-10-CM | POA: Diagnosis not present

## 2021-02-16 DIAGNOSIS — Z79899 Other long term (current) drug therapy: Secondary | ICD-10-CM | POA: Diagnosis not present

## 2021-02-16 DIAGNOSIS — R0602 Shortness of breath: Secondary | ICD-10-CM | POA: Diagnosis not present

## 2021-02-16 DIAGNOSIS — I10 Essential (primary) hypertension: Secondary | ICD-10-CM | POA: Diagnosis not present

## 2021-02-16 DIAGNOSIS — Z883 Allergy status to other anti-infective agents status: Secondary | ICD-10-CM | POA: Diagnosis not present

## 2021-02-16 DIAGNOSIS — R519 Headache, unspecified: Secondary | ICD-10-CM | POA: Diagnosis not present

## 2021-02-16 DIAGNOSIS — E782 Mixed hyperlipidemia: Secondary | ICD-10-CM | POA: Diagnosis not present

## 2021-02-16 DIAGNOSIS — Z87891 Personal history of nicotine dependence: Secondary | ICD-10-CM | POA: Diagnosis not present

## 2021-02-18 DIAGNOSIS — R0602 Shortness of breath: Secondary | ICD-10-CM | POA: Diagnosis not present

## 2021-02-18 DIAGNOSIS — R0789 Other chest pain: Secondary | ICD-10-CM | POA: Diagnosis not present

## 2021-03-17 ENCOUNTER — Other Ambulatory Visit: Payer: Self-pay

## 2021-03-17 ENCOUNTER — Emergency Department
Admission: EM | Admit: 2021-03-17 | Discharge: 2021-03-17 | Disposition: A | Discharge status: Home / Self Care | Payer: Medicare Other | Source: Home / Self Care

## 2021-03-17 MED ORDER — AZELASTINE-FLUTICASONE 137-50 MCG/ACT NA SUSP: 1.0000 | Freq: Two times a day (BID) | NASAL | 0 refills | Status: DC

## 2021-03-17 MED ORDER — PREDNISONE 20 MG PO TABS: ORAL_TABLET | ORAL | 0 refills | Status: DC

## 2021-03-17 NOTE — ED Triage Notes (Signed)
Pt presents to Urgent Care with c/o bilateral otalgia/ear fullness and nasal congestion, progressively worsening over the past several weeks. She states she thinks it's d/t allergies related to black mold in her apartment.

## 2021-03-17 NOTE — Discharge Instructions (Signed)
Stop Atrovent nasal spray.  Try the new Astelazine/fluticasone nasal spray twice a day. Drink lots of water Take prednisone as directed. See your primary care doctor if you fail to improve in a week

## 2021-03-18 ENCOUNTER — Telehealth: Payer: Self-pay

## 2021-03-18 NOTE — Telephone Encounter (Signed)
Pt call returned after speaking with provider about not being able to take meds rx'd after visit yesterday. Pt states she cannot take prednisone or flonase because of adverse affects. Advised to try OTC meds for symptomatic relief such as Mucinex and follow up with PCP if sxs don't improve. Pt has establish care appt with new PCP 5/23

## 2021-03-24 ENCOUNTER — Other Ambulatory Visit: Payer: Self-pay

## 2021-03-24 ENCOUNTER — Emergency Department (INDEPENDENT_AMBULATORY_CARE_PROVIDER_SITE_OTHER)
Admission: EM | Admit: 2021-03-24 | Discharge: 2021-03-24 | Disposition: A | Discharge status: Home / Self Care | Payer: Medicare Other | Source: Home / Self Care

## 2021-03-24 DIAGNOSIS — J0100 Acute maxillary sinusitis, unspecified: Secondary | ICD-10-CM | POA: Diagnosis not present

## 2021-03-24 DIAGNOSIS — J3489 Other specified disorders of nose and nasal sinuses: Secondary | ICD-10-CM | POA: Diagnosis not present

## 2021-03-24 DIAGNOSIS — J309 Allergic rhinitis, unspecified: Secondary | ICD-10-CM | POA: Diagnosis not present

## 2021-03-24 MED ORDER — AMOXICILLIN-POT CLAVULANATE 875-125 MG PO TABS: 1.0000 | ORAL_TABLET | Freq: Two times a day (BID) | ORAL | 0 refills | Status: DC

## 2021-03-24 MED ORDER — PREDNISONE 20 MG PO TABS: ORAL_TABLET | ORAL | 0 refills | Status: DC

## 2021-03-24 NOTE — Discharge Instructions (Addendum)
Advised/instructed patient to take medication with food to completion.  Advised patient to increase daily water intake while taking this medication.  Encourage patient to use previously prescribed/current medication-Astelin nasal spray for allergic rhinitis, postnasal drip.

## 2021-03-24 NOTE — ED Provider Notes (Signed)
[name]    CSN: [id number] Arrival date & time: 03/24/21  0804      History   Chief Complaint Chief Complaint  Patient presents with  . Pleurisy  . Facial Pain  . Sore Throat    HPI Janet Richards is a 60 y.o. female.   HPI 60 year old female presents with bilateral otalgia/ear fullness, sinus and nasal congestion; reports the symptoms have worsened over the past 2 to 3 weeks.  Patient believes her symptoms are related to allergies and/or black mold in her apartment.  MH significant for asthma and sinusitis.  Past Medical History:  Diagnosis Date  . Asthma   . Chronic pancreatitis ([place])   . Essential hypertension, benign   . Menstrual migraine   . Periodontitis   . Prediabetes 10/17/2014  . Primary osteoarthritis of both knees   . Sinusitis   . Urticaria     Patient Active Problem List   Diagnosis Date Noted  . Vaccine counseling 04/09/2020  . Palpitations 03/09/2020  . Irritation of ear, bilateral 03/09/2020  . Generalized headaches 02/18/2020  . GERD (gastroesophageal reflux disease) 02/18/2020  . Dysuria 09/16/2019  . Chronic pancreatitis ([place]) 04/10/2019  . Environmental allergies 09/12/2018  . Mild intermittent asthma without complication 09/47/0962  . Dyspepsia 07/31/2018  . Sensory neuronopathy 07/29/2018  . Post-menopausal bleeding 05/21/2018  . DDD (degenerative disc disease), cervical 02/22/2018  . Periodontitis 12/15/2017  . Vaginal dryness, menopausal 03/01/2017  . Abnormal ankle brachial index (ABI) 11/09/2016  . Numbness and tingling of both feet 09/02/2016  . Ganglion cyst of right foot 07/12/2016  . History of colonic polyps 03/20/2015  . Hyperlipidemia 02/20/2015  . Other allergic rhinitis 01/14/2015  . Prediabetes 10/17/2014  . Osteoarthritis of first metatarsophalangeal joint 04/11/2014  . 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 No blood products 03/28/2013  . Essential hypertension 03/01/2013  . Primary osteoarthritis  of both knees 12/27/2012    Past Surgical History:  Procedure Laterality Date  . TUBAL LIGATION  12/26/1987    OB History    Gravida  5   Para  3   Term  3   Preterm      AB  2   Living  3     SAB      IAB  2   Ectopic      Multiple      Live Births               Home Medications    Prior to Admission medications   Medication Sig Start Date End Date Taking? Authorizing Provider  amoxicillin-clavulanate (AUGMENTIN) 875-125 MG tablet Take 1 tablet by mouth every 12 (twelve) hours. 03/24/21  Yes Eliezer Lofts, FNP  predniSONE (DELTASONE) 20 MG tablet Take 3 tabs PO daily x 5 days. 03/24/21  Yes Eliezer Lofts, FNP  Azelastine-Fluticasone 137-50 MCG/ACT SUSP Place 1 puff into the nose in the morning and at bedtime. 03/17/21   Raylene Everts, MD  calcium-vitamin D 250-100 MG-UNIT tablet Take 1 tablet by mouth 2 (two) times daily.    [provider]  cetirizine HCl (ZYRTEC) 1 MG/ML solution Take 5 mg by mouth daily. 03/10/20   [provider]  conjugated estrogens (PREMARIN) vaginal cream Place 1 Applicatorful vaginally daily. For first 2 weeks then decrease to 3 times a week. 09/12/18   Breeback, Jade L, PA-C  famotidine (PEPCID) 20 MG tablet Take 1 tablet (20 mg total) by mouth 2 (two) times daily. 02/03/21   Meda Coffee,  Jennette Banker, MD  hydrochlorothiazide (HYDRODIURIL) 25 MG tablet Take 1 tablet (25 mg total) by mouth daily. 05/15/19   Cirigliano, Garvin Fila, DO  ketoconazole (NIZORAL) 2 % cream Apply 1 application topically 2 (two) times daily. To affected areas. 04/27/20   Orma Render, NP  Multiple Vitamins-Minerals (MULTIVITAMIN ADULT PO) Take by mouth.    [provider]  omeprazole (PRILOSEC) 40 MG capsule Take 1 capsule (40 mg total) by mouth daily. 02/03/21   Raylene Everts, MD  Turmeric 500 MG TABS Take by mouth.    [provider]  valACYclovir (VALTREX) 1000 MG tablet Take 1 tablet (1,000 mg total) by mouth 3 (three) times  daily. 04/27/20   Orma Render, NP  vitamin B-12 (CYANOCOBALAMIN) 100 MCG tablet Take 100 mcg by mouth daily.    [provider]    Family History Family History  Problem Relation Age of Onset  . Hypertension Mother   . CAD Father        MI in his 72s  . Allergic rhinitis Father   . Urticaria Sister   . Colon cancer Sister   . Allergic rhinitis Son   . Asthma Son   . Angioedema Neg Hx   . Eczema Neg Hx   . Immunodeficiency Neg Hx   . Esophageal cancer Neg Hx   . Stomach cancer Neg Hx   . Rectal cancer Neg Hx     Social History Social History   Tobacco Use  . Smoking status: Former Smoker    Packs/day: 1.50    Types: Cigarettes    Quit date: 02/05/2006    Years since quitting: 15.1  . Smokeless tobacco: Never Used  Vaping Use  . Vaping Use: Never used  Substance Use Topics  . Alcohol use: Yes    Comment: rarely  . Drug use: No     Allergies   Azithromycin and Clindamycin/lincomycin   Review of Systems Review of Systems  Constitutional: Negative.   HENT: Positive for congestion, postnasal drip, sinus pressure and sinus pain.   Eyes: Negative.   Respiratory: Negative.   Cardiovascular: Negative.   Gastrointestinal: Negative.   Genitourinary: Negative.   Musculoskeletal: Negative.   Skin: Negative.   Neurological: Negative.      Physical Exam Triage Vital Signs ED Triage Vitals  Enc Vitals Group     BP      Pulse      Resp      Temp      Temp src      SpO2      Weight      Height      Head Circumference      Peak Flow      Pain Score      Pain Loc      Pain Edu?      Excl. in [place]?    No data found.  Updated Vital Signs BP 137/85   Pulse 90   Temp 98.3 F (36.8 C) (Oral)   Resp 20   Ht 5\' 3"  (1.6 m)   Wt 190 lb (86.2 kg)   LMP 09/24/2017   SpO2 97%   BMI 33.66 kg/m      Physical Exam Vitals and nursing note reviewed.  Constitutional:      General: She is not in acute distress.    Appearance: She is well-developed.  She is obese. She is not ill-appearing.  HENT:     Head: Normocephalic and atraumatic.  Right Ear: Hearing and ear canal normal. Tympanic membrane is retracted.     Left Ear: Hearing and ear canal normal. Tympanic membrane is retracted.     Nose:     Right Turbinates: Enlarged.     Left Turbinates: Enlarged.     Right Sinus: Maxillary sinus tenderness present.     Left Sinus: Maxillary sinus tenderness present.     Comments: Turbinates are erythematous bilaterally    Mouth/Throat:     Lips: Pink.     Mouth: Mucous membranes are moist.     Pharynx: Oropharynx is clear. Uvula midline. No pharyngeal swelling, oropharyngeal exudate, posterior oropharyngeal erythema or uvula swelling.     Comments: Moderate clear drainage of posterior oropharynx noted Cardiovascular:     Rate and Rhythm: Normal rate and regular rhythm.     Pulses: Normal pulses.     Heart sounds: Normal heart sounds.  Pulmonary:     Effort: Pulmonary effort is normal.     Breath sounds: Normal breath sounds.     Comments: No adventitious breath sounds noted Musculoskeletal:     Cervical back: Normal range of motion.  Lymphadenopathy:     Cervical: No cervical adenopathy.  Skin:    General: Skin is warm and dry.  Neurological:     General: No focal deficit present.     Mental Status: She is alert and oriented to person, place, and time.  Psychiatric:        Mood and Affect: Mood normal.        Behavior: Behavior normal.      UC Treatments / Results  Labs (all labs ordered are listed, but only abnormal results are displayed) Labs Reviewed - No data to display  EKG   Radiology No results found.  Procedures Procedures (including critical care time)  Medications Ordered in UC Medications - No data to display  Initial Impression / Assessment and Plan / UC Course  I have reviewed the triage vital signs and the nursing notes.  Pertinent labs & imaging results that were available during my care of the  patient were reviewed by me and considered in my medical decision making (see chart for details).    MDM: 1.  Acute maxillary sinusitis, 2.  Sinus pressure, 3.  Allergic rhinitis.  Patient discharged home, hemodynamically stable Final Clinical Impressions(s) / UC Diagnoses   Final diagnoses:  Acute maxillary sinusitis, recurrence not specified  Sinus pressure  Allergic rhinitis, unspecified seasonality, unspecified trigger     Discharge Instructions     Advised/instructed patient to take medication with food to completion.  Advised patient to increase daily water intake while taking this medication.  Encourage patient to use previously prescribed/current medication-Astelin nasal spray for allergic rhinitis, postnasal drip.    ED Prescriptions    Medication Sig Dispense Auth. Provider   amoxicillin-clavulanate (AUGMENTIN) 875-125 MG tablet Take 1 tablet by mouth every 12 (twelve) hours. 14 tablet Eliezer Lofts, FNP   predniSONE (DELTASONE) 20 MG tablet Take 3 tabs PO daily x 5 days. 15 tablet Eliezer Lofts, FNP     PDMP not reviewed this encounter.   Eliezer Lofts, [place] 03/24/21 0900

## 2021-03-24 NOTE — ED Triage Notes (Addendum)
Pt presents to Urgent Care with c/o mid-sternal cp, especially w/ inhalation, beginning today. Also c/o sore throat, otalgia, and facial/tooth pain x approx 1 month.

## 2021-03-29 ENCOUNTER — Ambulatory Visit: Payer: Medicare Other | Admitting: Internal Medicine

## 2021-03-29 ENCOUNTER — Ambulatory Visit: Payer: Medicare Other | Attending: Internal Medicine | Admitting: Internal Medicine

## 2021-03-29 ENCOUNTER — Other Ambulatory Visit: Payer: Self-pay

## 2021-03-29 ENCOUNTER — Encounter: Payer: Self-pay | Admitting: Internal Medicine

## 2021-03-29 DIAGNOSIS — I10 Essential (primary) hypertension: Secondary | ICD-10-CM

## 2021-03-29 DIAGNOSIS — J0100 Acute maxillary sinusitis, unspecified: Secondary | ICD-10-CM

## 2021-03-29 DIAGNOSIS — Z7689 Persons encountering health services in other specified circumstances: Secondary | ICD-10-CM | POA: Diagnosis not present

## 2021-03-29 DIAGNOSIS — K219 Gastro-esophageal reflux disease without esophagitis: Secondary | ICD-10-CM

## 2021-03-29 NOTE — Progress Notes (Signed)
Virtual Visit via Telephone Note  I connected with Janet Richards on 03/29/2021 at 1:53 PM by telephone and verified that I am speaking with the correct person using two identifiers  Location: Patient: home Provider: office  Participants: Myself Patient CMA: [facility] interpreter:   I discussed the limitations, risks, security and privacy concerns of performing an evaluation and management service by telephone and the availability of in person appointments. I also discussed with the patient that there may be a patient responsible charge related to this service. The patient expressed understanding and agreed to proceed.   History of Present Illness: Pt with hx of HTN, HL, OA knees, PreDM, GERD,asthma, OA knees. Patient wanting to establish care with [place] today. A few years ago she was seeing a primary care physician through [facility] but left because she states her account was hacked.  Subsequently went to [facility] and did not particularly like it where.  She states they were all about the money and one of their physicians who saw her in the absence of her PCP had looked suggestively down her shirt.  Complains of issues with allergies and sinusitis.  Recently seen in urgent care and was prescribed prednisone and Augmentin.  States that she is not taking the prednisone but is on the antibiotics and feeling a bit better.  As an aside she states that she has neuropathy in her feet that is mild but whenever she has infection in her body it gets worse and it was a bit worse when she got the sinusitis.  GERD: Ongoing issue for her.  She is on omeprazole but states that she alternates it with Tums and  Equate Acid Reducer Complete.  HTN: She is on HCTZ which she takes consistently.  Reports that her blood pressure has been good.  Last time she checked it was several weeks ago and she thinks it was 138/82.     Observations/Objective: No direct observation done as this was a  telephone visit.  Assessment and Plan: 1. Encounter to establish care  2. Essential hypertension Continue hydrochlorothiazide.  Advised patient that the goal is 130/80 or lower.  Encouraged her to check blood pressure at least once a week.  3. Subacute maxillary sinusitis Complete current course of Augmentin  4. Gastroesophageal reflux disease without esophagitis GERD precautions discussed and encouraged including foods to avoid.  Advised to eat her last meal at least 2 to 3 hours before laying down at night and to sleep with her head slightly elevated.   Follow Up Instructions: 2 mths   I discussed the assessment and treatment plan with the patient. The patient was provided an opportunity to ask questions and all were answered. The patient agreed with the plan and demonstrated an understanding of the instructions.   The patient was advised to call back or seek an in-person evaluation if the symptoms worsen or if the condition fails to improve as anticipated.  I  Spent 19 minutes on this telephone encounter  Karle Plumber, MD

## 2021-04-10 ENCOUNTER — Other Ambulatory Visit: Payer: Self-pay | Admitting: Internal Medicine

## 2021-04-10 ENCOUNTER — Other Ambulatory Visit: Payer: Self-pay

## 2021-04-10 ENCOUNTER — Emergency Department (INDEPENDENT_AMBULATORY_CARE_PROVIDER_SITE_OTHER)
Admission: EM | Admit: 2021-04-10 | Discharge: 2021-04-10 | Disposition: A | Discharge status: Home / Self Care | Payer: Medicare Other | Source: Home / Self Care | Attending: Family Medicine | Admitting: Family Medicine

## 2021-04-10 DIAGNOSIS — M25511 Pain in right shoulder: Secondary | ICD-10-CM

## 2021-04-10 DIAGNOSIS — J0100 Acute maxillary sinusitis, unspecified: Secondary | ICD-10-CM

## 2021-04-10 MED ORDER — IBUPROFEN 800 MG PO TABS: 800.0000 mg | ORAL_TABLET | Freq: Three times a day (TID) | ORAL | 0 refills | Status: DC

## 2021-04-10 MED ORDER — AMOXICILLIN-POT CLAVULANATE 875-125 MG PO TABS: 1.0000 | ORAL_TABLET | Freq: Two times a day (BID) | ORAL | 0 refills | Status: DC

## 2021-04-10 NOTE — ED Triage Notes (Signed)
Pt states that she has some right shoulder pain. x2 days

## 2021-04-10 NOTE — Telephone Encounter (Signed)
Requested medication (s) are due for refill today: yes  Requested medication (s) are on the active medication list: yes  Last refill:  05/15/19  Future visit scheduled: yes  Notes to clinic:  last prescribed by previous PCP (Dr. Bryan Lemma)   Requested Prescriptions  Pending Prescriptions Disp Refills   hydrochlorothiazide (HYDRODIURIL) 25 MG tablet [Pharmacy Med Name: hydrochlorothiazide 25 mg tablet] 90 tablet 0    Sig: TAKE ONE TABLET BY MOUTH EVERY DAY      Cardiovascular: Diuretics - Thiazide Failed - 04/10/2021 12:17 PM      Failed - Ca in normal range and within 360 days    Calcium  Date Value Ref Range Status  04/09/2020 10.6 (H) 8.6 - 10.4 mg/dL Final          Failed - Cr in normal range and within 360 days    Creat  Date Value Ref Range Status  04/09/2020 0.87 0.50 - 1.05 mg/dL Final    Comment:    For patients >54 years of age, the reference limit for Creatinine is approximately 13% higher for people identified as African-American. .           Failed - K in normal range and within 360 days    Potassium  Date Value Ref Range Status  04/09/2020 4.0 3.5 - 5.3 mmol/L Final          Failed - Na in normal range and within 360 days    Sodium  Date Value Ref Range Status  04/09/2020 138 135 - 146 mmol/L Final          Passed - Last BP in normal range    BP Readings from Last 1 Encounters:  04/10/21 132/74          Passed - Valid encounter within last 6 months    Recent Outpatient Visits           1 week ago Encounter to establish care   [place], Deborah B, MD   11 months ago Periodontitis   [facility] [place], DO   11 months ago Fungal infection of skin   [place] [facility], Coralee Pesa, NP   1 year ago Painful urination   [place] [facility], Einar Pheasant, DO   1 year ago Palpitations   [place]  [facility], Einar Pheasant, DO       Future Appointments             In 1 month Wynetta Emery Dalbert Batman, MD [place]

## 2021-04-12 NOTE — ED Provider Notes (Signed)
[place]   [id number] 04/10/21 Arrival Time: 1027  ASSESSMENT & PLAN:  1. Acute pain of right shoulder   2. Acute non-recurrent maxillary sinusitis    No indication for shoulder imaging.  Meds ordered this encounter  Medications  . ibuprofen (ADVIL) 800 MG tablet    Sig: Take 1 tablet (800 mg total) by mouth 3 (three) times daily with meals.    Dispense:  21 tablet    Refill:  0  . amoxicillin-clavulanate (AUGMENTIN) 875-125 MG tablet    Sig: Take 1 tablet by mouth every 12 (twelve) hours.    Dispense:  20 tablet    Refill:  0    Recommend:  Follow-up Information    Ladell Pier, MD.   Specialty: Internal Medicine Why: If worsening or failing to improve as anticipated. Contact information: [place] [address] [phone]              Rest the injured area as much as practical.  Reviewed expectations re: course of current medical issues. Questions answered. Outlined signs and symptoms indicating need for more acute intervention. Patient verbalized understanding. After Visit Summary given.  SUBJECTIVE: History from: patient. Janet Richards is a 60 y.o. female who reports vague R shoulder pain; gradual onset past two days; no injury/trauma; 'just really sore; think I may have lifted something heavy'. Stable. Pain worse with certain movements without significant radiation. Symptoms have progressed to a point and plateaued since beginning. Aggravating factors: certain movements. Alleviating factors: rest. Associated symptoms: none reported.  Extremity sensation changes or weakness: none. Self treatment: has not tried OTC therapies.  History of similar: no.  Past Surgical History:  Procedure Laterality Date  . TUBAL LIGATION  12/26/1987    Also reports two weeks of persistent sinus pressure; triggered by seasonal allergies. Afebrile. Feels like she has a sinus infection. H/O similar.  OBJECTIVE:  Vitals:   04/10/21  1043 04/10/21 1052  BP:  132/74  Pulse:  82  Temp:  98.3 F (36.8 C)  TempSrc:  Oral  SpO2:  98%  Weight: 87.5 kg   Height: 5\' 3"  (1.6 m)     General appearance: alert; no distress HEENT: [place]; AT; nasal congestion; turbinates boggy; TTP over max sinuses Neck: supple with FROM Resp: unlabored respirations Extremities: . RUE: warm with well perfused appearance; poorly localized moderate tenderness over right anterior shoulder mainly; without gross deformities; swelling: none; bruising: none; shoulder ROM: normal CV: brisk extremity capillary refill of RUE; 2+ radial pulse of RUE Skin: warm and dry; no visible rashes Neurologic: gait normal; normal sensation and strength of RUE Psychological: alert and cooperative; normal mood and affect   Allergies  Allergen Reactions  . Azithromycin Nausea And Vomiting  . Clindamycin/Lincomycin Hives    Facial swelling    Past Medical History:  Diagnosis Date  . Asthma   . Chronic pancreatitis ([place])   . Essential hypertension, benign   . Menstrual migraine   . Periodontitis   . Prediabetes 10/17/2014  . Primary osteoarthritis of both knees   . Sinusitis   . Urticaria    Social History   Socioeconomic History  . Marital status: Married    Spouse name: Not on file  . Number of children: 3  . Years of education: Not on file  . Highest education level: Not on file  Occupational History  . Occupation: homemaker  Tobacco Use  . Smoking status: Former Smoker    Packs/day: 1.50  Types: Cigarettes    Quit date: 02/05/2006    Years since quitting: 15.1  . Smokeless tobacco: Never Used  Vaping Use  . Vaping Use: Never used  Substance and Sexual Activity  . Alcohol use: Yes    Comment: rarely  . Drug use: No  . Sexual activity: Yes    Partners: Male  Other Topics Concern  . Not on file  Social History Narrative  . Not on file   Social Determinants of Health   Financial Resource Strain: Not on file  Food Insecurity: Not on  file  Transportation Needs: Not on file  Physical Activity: Not on file  Stress: Not on file  Social Connections: Not on file   Family History  Problem Relation Age of Onset  . Hypertension Mother   . CAD Father        MI in his 59s  . Allergic rhinitis Father   . Urticaria Sister   . Colon cancer Sister   . Allergic rhinitis Son   . Asthma Son   . Angioedema Neg Hx   . Eczema Neg Hx   . Immunodeficiency Neg Hx   . Esophageal cancer Neg Hx   . Stomach cancer Neg Hx   . Rectal cancer Neg Hx    Past Surgical History:  Procedure Laterality Date  . TUBAL LIGATION  12/26/1987      Vanessa Kick, MD 04/12/21 [phone]

## 2021-05-03 ENCOUNTER — Other Ambulatory Visit: Payer: Self-pay | Admitting: Internal Medicine

## 2021-05-03 NOTE — Telephone Encounter (Signed)
Requested medication (s) are due for refill today: Yes  Requested medication (s) are on the active medication list: Yes  Last refill:  Unknown  Future visit scheduled: Yes  Notes to clinic:  Unable to refill per protocol, last refill by another provider.      Requested Prescriptions  Pending Prescriptions Disp Refills   cetirizine (ZYRTEC) 10 MG tablet [Pharmacy Med Name: cetirizine 10 mg tablet] 90 tablet 0    Sig: TAKE ONE TABLET BY MOUTH EVERY DAY      Ear, Nose, and Throat:  Antihistamines Passed - 05/03/2021 10:30 AM      Passed - Valid encounter within last 12 months    Recent Outpatient Visits           1 month ago Encounter to establish care   [place], Deborah B, MD   12 months ago Periodontitis   [facility], [place], DO   1 year ago Fungal infection of skin   [place] [facility], Coralee Pesa, NP   1 year ago Painful urination   [place] [facility], Einar Pheasant, DO   1 year ago Palpitations   [place] [facility] [place], Einar Pheasant, DO       Future Appointments             In 4 weeks Ladell Pier, MD [place]

## 2021-05-13 ENCOUNTER — Emergency Department (INDEPENDENT_AMBULATORY_CARE_PROVIDER_SITE_OTHER)
Admission: EM | Admit: 2021-05-13 | Discharge: 2021-05-13 | Disposition: A | Discharge status: Home / Self Care | Payer: Medicare Other | Source: Home / Self Care | Attending: Family Medicine | Admitting: Family Medicine

## 2021-05-13 ENCOUNTER — Other Ambulatory Visit: Payer: Self-pay

## 2021-05-13 ENCOUNTER — Encounter: Payer: Self-pay | Admitting: Emergency Medicine

## 2021-05-13 DIAGNOSIS — J329 Chronic sinusitis, unspecified: Secondary | ICD-10-CM | POA: Diagnosis not present

## 2021-05-13 MED ORDER — CEFUROXIME AXETIL 500 MG PO TABS: 500.0000 mg | ORAL_TABLET | Freq: Two times a day (BID) | ORAL | 0 refills | Status: DC

## 2021-05-13 NOTE — ED Provider Notes (Signed)
Janet [facility] C[facility]    CSN: [id number] Arrival date & time: 05/13/21  1408      History   Chief Complaint Chief Complaint  Patient presents with   URI    HPI Janet Richards is a 60 y.o. female.   HPI  Patient is well-known to me.  Frequent visitor of the urgent care center.  She does have a primary care doctor but states that she could not get in.  She has frequent allergy symptoms and sinus infections.  She was just treated for one 5 weeks ago.  Patient states she has had symptoms for over a week.  Sinus congestion.  Sinus pressure.  Ear pressure and pain.  She states she is getting a sore throat.  Some coughing and chest congestion.  Pain in anterior chest from the coughing.  She states her usual allergy medicines are not helping.  She will feels like she needs an antibiotic.  I encourage primary care follow-up perhaps a specialty appointment with ENT or allergy  Past Medical History:  Diagnosis Date   Asthma    Chronic pancreatitis ([place])    Essential hypertension, benign    Menstrual migraine    Periodontitis    Prediabetes 10/17/2014   Primary osteoarthritis of both knees    Sinusitis    Urticaria     Patient Active Problem List   Diagnosis Date Noted   Vaccine counseling 04/09/2020   Palpitations 03/09/2020   Irritation of ear, bilateral 03/09/2020   Generalized headaches 02/18/2020   GERD (gastroesophageal reflux disease) 02/18/2020   Dysuria 09/16/2019   Chronic pancreatitis ([place]) 04/10/2019   Environmental allergies 09/12/2018   Mild intermittent asthma without complication 73/22/0254   Dyspepsia 07/31/2018   Sensory neuronopathy 07/29/2018   Post-menopausal bleeding 05/21/2018   DDD (degenerative disc disease), cervical 02/22/2018   Periodontitis 12/15/2017   Vaginal dryness, menopausal 03/01/2017   Abnormal ankle brachial index (ABI) 11/09/2016   Numbness and tingling of both feet 09/02/2016   Ganglion cyst of right foot 07/12/2016   History  of colonic polyps 03/20/2015   Hyperlipidemia 02/20/2015   Other allergic rhinitis 01/14/2015   Prediabetes 10/17/2014   Osteoarthritis of first metatarsophalangeal joint 04/11/2014   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 Authorizing Provider  calcium-vitamin D 250-100 MG-UNIT tablet Take 1 tablet by mouth 2 (two) times daily.   Yes [provider]  cefUROXime (CEFTIN) 500 MG tablet Take 1 tablet (500 mg total) by mouth 2 (two) times daily with a meal. 05/13/21  Yes Raylene Everts, MD  cetirizine (ZYRTEC) 10 MG tablet TAKE ONE TABLET BY MOUTH EVERY DAY 05/03/21  Yes Ladell Pier, MD  cetirizine HCl (ZYRTEC) 1 MG/ML solution Take 5 mg by  mouth daily. 03/10/20  Yes [provider]  conjugated estrogens (PREMARIN) vaginal cream Place 1 Applicatorful vaginally daily. For first 2 weeks then decrease to 3 times a week. 09/12/18  Yes Breeback, Jade L, PA-C  famotidine (PEPCID) 20 MG tablet Take 1 tablet (20 mg total) by mouth 2 (two) times daily. 02/03/21  Yes Raylene Everts, MD  hydrochlorothiazide (HYDRODIURIL) 25 MG tablet TAKE ONE TABLET BY MOUTH EVERY DAY 04/13/21  Yes Ladell Pier, MD  ibuprofen (ADVIL) 800 MG tablet Take 1 tablet (800 mg total) by mouth 3 (three) times daily with meals. 04/10/21  Yes Hagler, Aaron Edelman, MD  ketoconazole (NIZORAL) 2 % cream Apply 1 application topically 2 (two) times daily. To affected areas. 04/27/20  Yes Early, Coralee Pesa, NP  Multiple Vitamins-Minerals  (MULTIVITAMIN ADULT PO) Take by mouth.   Yes [provider]  omeprazole (PRILOSEC) 40 MG capsule Take 1 capsule (40 mg total) by mouth daily. 02/03/21  Yes Raylene Everts, MD  predniSONE (DELTASONE) 20 MG tablet Take 3 tabs PO daily x 5 days. 03/24/21  Yes Eliezer Lofts, FNP  Turmeric 500 MG TABS Take by mouth.   Yes [provider]  valACYclovir (VALTREX) 1000 MG tablet Take 1 tablet (1,000 mg total) by mouth 3 (three) times daily. 04/27/20  Yes Early, Coralee Pesa, NP  vitamin B-12 (CYANOCOBALAMIN) 100 MCG tablet Take 100 mcg by mouth daily.   Yes [provider]    Family History Family History  Problem Relation Age of Onset   Hypertension Mother    CAD Father        MI in his 57s   Allergic rhinitis Father    Urticaria Sister    Colon cancer Sister    Allergic rhinitis Son    Asthma Son    Angioedema Neg Hx    Eczema Neg Hx    Immunodeficiency Neg Hx    Esophageal cancer Neg Hx    Stomach cancer Neg Hx    Rectal cancer Neg Hx     Social History Social History   Tobacco Use   Smoking status: Former    Packs/day: 1.50    Pack years: 0.00    Types: Cigarettes    Quit date: 02/05/2006    Years since quitting: 15.2   Smokeless tobacco: Never  Vaping Use   Vaping Use: Never used  Substance Use Topics   Alcohol use: Yes    Comment: rarely   Drug use: No     Allergies   Azithromycin and Clindamycin/lincomycin   Review of Systems Review of Systems See HPI  Physical Exam Triage Vital Signs ED Triage Vitals [05/13/21 1433]  Enc Vitals Group     BP 134/84     Pulse Rate 84     Resp      Temp 98.2 F (36.8 C)     Temp Source Oral     SpO2 98 %     Weight      Height      Head Circumference      Peak Flow      Pain Score 6     Pain Loc      Pain Edu?      Excl. in [place]?    No data found.  Updated Vital Signs BP 134/84 (BP Location: Right Arm)   Pulse 84   Temp 98.2 F (36.8 C) (Oral)   LMP 09/24/2017   SpO2 98%       Physical  Exam Constitutional:      General: She is not in acute distress.    Appearance: She is well-developed and normal weight.  HENT:     Head: Normocephalic and atraumatic.     Right Ear: Tympanic membrane and ear canal normal.     Left Ear: Tympanic membrane and ear canal normal.     Nose: Congestion present.     Comments: Sinuses are tender    Mouth/Throat:     Pharynx: Posterior oropharyngeal erythema present.  Eyes:     Conjunctiva/sclera: Conjunctivae normal.     Pupils: Pupils are equal, round, and reactive to light.  Cardiovascular:     Rate and Rhythm: Normal rate and regular rhythm.     Heart sounds: Normal heart sounds.  Pulmonary:     Effort: Pulmonary effort is normal. No respiratory distress.     Breath sounds: Normal breath sounds.  Abdominal:     General: There is no distension.     Palpations: Abdomen is soft.  Musculoskeletal:        General: Normal range of motion.     Cervical back: Normal range of motion.  Skin:    General: Skin is warm and dry.  Neurological:     Mental Status: She is alert.  Psychiatric:        Mood and Affect: Mood normal.        Behavior: Behavior normal.     UC Treatments / Results  Labs (all labs ordered are listed, but only abnormal results are displayed) Labs Reviewed - No data to display  EKG   Radiology No results found.  Procedures Procedures (including critical care time)  Medications Ordered in UC Medications - No data to display  Initial Impression / Assessment and Plan / UC Course  I have reviewed the triage vital signs and the nursing notes.  Pertinent labs & imaging results that were available during my care of the patient were reviewed by me and considered in my medical decision making (see chart for details).     Recurring symptoms may be allergies or virus.  I worry about her antibiotic usage and told her so.  Recommend specialty follow-up Final Clinical Impressions(s) / UC Diagnoses    Final diagnoses:  Recurrent sinusitis     Discharge Instructions      Continue to drink plenty of fluids Continue your allergy medicine and nasal spray Take the Ceftin 2 times a day with food Follow-up with your primary care doctor next month as scheduled   ED Prescriptions     Medication Sig Dispense Auth. Provider   cefUROXime (CEFTIN) 500 MG tablet Take 1 tablet (500 mg total) by mouth 2 (two) times daily with a meal. 20 tablet Raylene Everts, MD      PDMP not reviewed this encounter.   Raylene Everts, MD 05/13/21 [phone]

## 2021-05-13 NOTE — Discharge Instructions (Addendum)
Continue to drink plenty of fluids Continue your allergy medicine and nasal spray Take the Ceftin 2 times a day with food Follow-up with your primary care doctor next month as scheduled

## 2021-05-13 NOTE — ED Triage Notes (Signed)
Patient c/o sinus pressure, bilateral ear pain, afebrile, non-productive cough x 1 week.  Patient taken Zyrtec, Immunical (immunity booster). Patient is vaccinated for COVID.  Home COVID test negative.

## 2021-06-01 ENCOUNTER — Ambulatory Visit: Payer: Medicare Other | Admitting: Internal Medicine

## 2021-06-01 ENCOUNTER — Telehealth: Payer: Self-pay | Admitting: Internal Medicine

## 2021-06-01 NOTE — Telephone Encounter (Signed)
Patient had an appointment today but arrived late appt was rescheduled. Patient is asking to receive an antibiotic for a tooth infection she has. Patient was also given [place] number to get an appointment with [place]. Please advise.

## 2021-06-01 NOTE — Telephone Encounter (Signed)
Contacted pt to schedule a virtual appt with provider pt didn't answer lvm

## 2021-06-08 ENCOUNTER — Emergency Department (INDEPENDENT_AMBULATORY_CARE_PROVIDER_SITE_OTHER)
Admission: EM | Admit: 2021-06-08 | Discharge: 2021-06-08 | Disposition: A | Discharge status: Home / Self Care | Payer: Medicare Other | Source: Home / Self Care | Attending: Family Medicine | Admitting: Family Medicine

## 2021-06-08 ENCOUNTER — Other Ambulatory Visit: Payer: Self-pay

## 2021-06-08 DIAGNOSIS — K219 Gastro-esophageal reflux disease without esophagitis: Secondary | ICD-10-CM

## 2021-06-08 DIAGNOSIS — R519 Headache, unspecified: Secondary | ICD-10-CM

## 2021-06-08 DIAGNOSIS — K047 Periapical abscess without sinus: Secondary | ICD-10-CM

## 2021-06-08 MED ORDER — FAMOTIDINE 20 MG PO TABS: 20.0000 mg | ORAL_TABLET | Freq: Two times a day (BID) | ORAL | 0 refills | Status: DC

## 2021-06-08 MED ORDER — AMOXICILLIN 875 MG PO TABS
875.0000 mg | ORAL_TABLET | Freq: Two times a day (BID) | ORAL | 0 refills | Status: DC
Start: 2021-06-08 — End: 2021-06-29

## 2021-06-08 MED ORDER — SUCRALFATE 1 G PO TABS: 1.0000 g | ORAL_TABLET | Freq: Three times a day (TID) | ORAL | 0 refills | Status: DC

## 2021-06-08 NOTE — ED Provider Notes (Signed)
Had a prior Janet [facility]    CSN: [id number] Arrival date & time: 06/08/21  1746      History   Chief Complaint Chief Complaint  Patient presents with   Headache   Chest Pain    Tightness    HPI Janet Richards is a 60 y.o. female.   HPI  Patient is a regular at the urgent care center.  Unfortunately.  She has multiple medical problems that are not being well managed by her primary care doctor.  She has trouble keeping a primary care doctor because she changes doctors often, and frequently arrives late missing her appointments.  [facility] appointment for last week that she missed and is scheduled for a virtual appointment August 8.  She states she is not going to go back to this doctor and is going to look for eye doctor within [facility].  Does not like [facility].  Does not like [facility].  Today she has a headache.  A dental infection.  GERD.  She stopped taking her Protonix because she read it can cause lupus.  She is no longer taking omeprazole.  She does not remember ever taking famotidine.  She has chronic sinusitis.  She has seen ENT.  She feels that she needs antibiotics every time she has a runny or stuffy nose.  She does not have a dentist.  She is in between doses as well.  She states that the last dentist she went to she would not not pull her teeth because the people in the waiting room said she should not.   Past Medical History:  Diagnosis Date   Asthma    Chronic pancreatitis ([place])    Essential hypertension, benign    Menstrual migraine    Periodontitis    Prediabetes 10/17/2014   Primary osteoarthritis of both knees    Sinusitis    Urticaria     Patient Active Problem List   Diagnosis Date Noted   Vaccine counseling 04/09/2020   Palpitations 03/09/2020   Irritation of ear, bilateral 03/09/2020   Generalized headaches 02/18/2020   GERD (gastroesophageal reflux disease) 02/18/2020   Dysuria 09/16/2019   Chronic pancreatitis ([place])  04/10/2019   Environmental allergies 09/12/2018   Mild intermittent asthma without complication 0000000   Dyspepsia 07/31/2018   Sensory neuronopathy 07/29/2018   Post-menopausal bleeding 05/21/2018   DDD (degenerative disc disease), cervical 02/22/2018   Periodontitis 12/15/2017   Vaginal dryness, menopausal 03/01/2017   Abnormal ankle brachial index (ABI) 11/09/2016   Numbness and tingling of both feet 09/02/2016   Ganglion cyst of right foot 07/12/2016   History of colonic polyps 03/20/2015   Hyperlipidemia 02/20/2015   Other allergic rhinitis 01/14/2015   Prediabetes 10/17/2014   Osteoarthritis of first metatarsophalangeal joint 04/11/2014   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 Authorizing Provider  amoxicillin (AMOXIL) 875 MG tablet Take 1 tablet (875 mg total) by mouth 2 (two) times daily. 06/08/21  Yes Raylene Everts, MD  sucralfate (CARAFATE) 1 g tablet Take 1 tablet (1 g total) by mouth 4 (four) times daily -  with meals and at bedtime. Coats stomach 06/08/21  Yes Raylene Everts, MD  calcium-vitamin D 250-100 MG-UNIT tablet Take 1 tablet by mouth 2 (two) times daily.    [provider]  cetirizine (ZYRTEC) 10 MG tablet TAKE ONE TABLET BY MOUTH EVERY DAY 05/03/21   Ladell Pier, MD  conjugated estrogens (PREMARIN) vaginal cream Place 1 Applicatorful vaginally daily. For first 2 weeks then decrease to 3 times a week. 09/12/18   Breeback, Jade L, PA-C  hydrochlorothiazide  (HYDRODIURIL) 25 MG tablet TAKE ONE TABLET BY MOUTH EVERY DAY 04/13/21   Ladell Pier, MD  ibuprofen (ADVIL) 800 MG tablet Take 1 tablet (800 mg total) by mouth 3 (three) times daily with meals. 04/10/21   Vanessa Kick, MD  Multiple Vitamins-Minerals (MULTIVITAMIN ADULT PO) Take by mouth.    [provider]  Turmeric 500 MG TABS Take by mouth.    [provider]  valACYclovir (VALTREX) 1000 MG tablet Take 1 tablet (1,000 mg total) by mouth 3 (three) times daily. 04/27/20   Orma Render, NP  vitamin B-12 (CYANOCOBALAMIN) 100 MCG tablet Take 100 mcg by mouth daily.    [provider]    Family History Family History  Problem Relation Age of Onset   Hypertension Mother    CAD Father        MI in his 55s   Allergic rhinitis Father    Urticaria Sister    Colon cancer Sister    Allergic rhinitis Son    Asthma Son    Angioedema Neg Hx    Eczema Neg Hx    Immunodeficiency Neg Hx    Esophageal cancer Neg Hx    Stomach cancer Neg Hx    Rectal cancer Neg Hx     Social History Social History   Tobacco Use   Smoking status: Former    Packs/day: 1.50    Types: Cigarettes    Quit date: 02/05/2006    Years since quitting: 15.3   Smokeless tobacco: Never  Vaping Use   Vaping Use: Never used  Substance Use Topics   Alcohol use: Yes    Comment: rarely   Drug use: No     Allergies   Azithromycin and Clindamycin/lincomycin   Review of Systems Review of Systems See HPI  Physical Exam Triage Vital Signs ED Triage Vitals  Enc Vitals Group     BP 06/08/21 1820 (!) 156/89     Pulse Rate 06/08/21 1820 89     Resp 06/08/21 1820 18     Temp 06/08/21 1820 98.7 F (37.1 C)     Temp Source 06/08/21 1820 Oral     SpO2 06/08/21 1820 98 %     Weight --      Height --      Head Circumference --      Peak Flow --      Pain Score 06/08/21 1821 8     Pain Loc --      Pain Edu? --      Excl. in [place]? --    No data found.  Updated Vital  Signs BP (!)  156/89 (BP Location: Right Arm)   Pulse 89   Temp 98.7 F (37.1 C) (Oral)   Resp 18   LMP 09/24/2017   SpO2 98%      Physical Exam Constitutional:      General: She is not in acute distress.    Appearance: She is well-developed.     Comments: Sent.  Talkative.  Mildly overweight  HENT:     Head: Normocephalic and atraumatic.     Right Ear: Tympanic membrane and ear canal normal.     Left Ear: Tympanic membrane and ear canal normal.     Nose: Congestion present.     Mouth/Throat:     Mouth: Mucous membranes are moist.     Pharynx: No posterior oropharyngeal erythema.     Comments: Left upper gumline has many absent teeth.  The 1 tooth remaining does have erythema of the gums surrounding it is tender to touch.  No sinus tenderness.  Clear rhinorrhea Eyes:     Conjunctiva/sclera: Conjunctivae normal.     Pupils: Pupils are equal, round, and reactive to light.  Cardiovascular:     Rate and Rhythm: Normal rate and regular rhythm.     Heart sounds: Normal heart sounds.  Pulmonary:     Effort: Pulmonary effort is normal. No respiratory distress.     Breath sounds: Normal breath sounds.  Abdominal:     General: Abdomen is flat. There is no distension.     Palpations: Abdomen is soft.     Tenderness: There is no abdominal tenderness.  Musculoskeletal:        General: Normal range of motion.     Cervical back: Normal range of motion.  Skin:    General: Skin is warm and dry.  Neurological:     Mental Status: She is alert.     UC Treatments / Results  Labs (all labs ordered are listed, but only abnormal results are displayed) Labs Reviewed - No data to display  EKG   Radiology No results found.  Procedures Procedures (including critical care time)  Medications Ordered in UC Medications - No data to display  Initial Impression / Assessment and Plan / UC Course  I have reviewed the triage vital signs and the nursing notes.  Pertinent labs & imaging results that  were available during my care of the patient were reviewed by me and considered in my medical decision making (see chart for details).     Patient wants medicine for her GERD but does not want a PPI.  Does not remember a famotidine helped.  I will give her Carafate and see if this coats her stomach enough to give her symptom relief.  She needs a PCP.  This is emphasized to her.  She is given Amoxil for her dental infection.  This is not for her sinuses.  Her sinuses do not require treatment at this time. Final Clinical Impressions(s) / UC Diagnoses   Final diagnoses:  Gastroesophageal reflux disease, unspecified whether esophagitis present  Dental infection  Bad headache     Discharge Instructions      Take Amoxil twice a day for dental infection Continue other medications Take Carafate with meals and at bedtime as needed for stomach pain.  This will coat your stomach and esophagus, reduces stomach acid and pain Follow-up with primary care doctor If you need dental extractions consider an oral surgeon.  [place]   ED Prescriptions  Medication Sig Dispense Auth. Provider   sucralfate (CARAFATE) 1 g tablet Take 1 tablet (1 g total) by mouth 4 (four) times daily -  with meals and at bedtime. Coats stomach 60 tablet Raylene Everts, MD   amoxicillin (AMOXIL) 875 MG tablet Take 1 tablet (875 mg total) by mouth 2 (two) times daily. 14 tablet Raylene Everts, MD      PDMP not reviewed this encounter.   Raylene Everts, MD 06/08/21 2019

## 2021-06-08 NOTE — Discharge Instructions (Addendum)
Take Amoxil twice a day for dental infection Continue other medications Take Carafate with meals and at bedtime as needed for stomach pain.  This will coat your stomach and esophagus, reduces stomach acid and pain Follow-up with primary care doctor If you need dental extractions consider an oral surgeon.  Dr. Tamela Oddi in [place] is recommended

## 2021-06-08 NOTE — ED Triage Notes (Signed)
Pt c/o headache x 2 days. Also c/o chest pain/tightness x 2 weeks. Hx of GERD. Advil yesterday for HA. Tums or lactaid prn.

## 2021-06-14 ENCOUNTER — Other Ambulatory Visit: Payer: Self-pay

## 2021-06-14 ENCOUNTER — Ambulatory Visit: Payer: Medicare Other | Attending: Internal Medicine | Admitting: Internal Medicine

## 2021-06-14 DIAGNOSIS — K029 Dental caries, unspecified: Secondary | ICD-10-CM

## 2021-06-14 DIAGNOSIS — N644 Mastodynia: Secondary | ICD-10-CM | POA: Diagnosis not present

## 2021-06-14 DIAGNOSIS — I10 Essential (primary) hypertension: Secondary | ICD-10-CM

## 2021-06-14 NOTE — Progress Notes (Signed)
Patient ID: Janet Richards, female   DOB: Mar 04, 1961, 60 y.o.   MRN: [id number] Virtual Visit via Telephone Note  I connected with Retta Mac on 06/14/2021 at 2:31 p.m by telephone and verified that I am speaking with the correct person using two identifiers  Location: Patient: home Provider: office  Participants: Myself Patient   I discussed the limitations, risks, security and privacy concerns of performing an evaluation and management service by telephone and the availability of in person appointments. I also discussed with the patient that there may be a patient responsible charge related to this service. The patient expressed understanding and agreed to proceed.   History of Present Illness: Pt with hx of HTN, HL, OA knees, PreDM, GERD,asthma, OA knees.  Last evaluated 03/29/2021.  Canceled last appt with me last mth   Pt c/o dental issue. Has a "hole" in her teeth and needs extraction.  Spoke to her dentist last wk and has been referred to an endodontist  Gets intermittent burning sensation in breasts and nipples x 1 mth No dischg from nipple, no abn masses felt. Last MMG 08/2020 and was nl.  She is wanting to have a  x-ray done to see if that shows anything in the breast. She wonders whether the burning is due to infection in her tooth.  On abx.     HTN: checks BP intermittently but not in past wk Taking HCTZ No HA.  Reports gets HA when BP elev Outpatient Encounter Medications as of 06/14/2021  Medication Sig   amoxicillin (AMOXIL) 875 MG tablet Take 1 tablet (875 mg total) by mouth 2 (two) times daily.   calcium-vitamin D 250-100 MG-UNIT tablet Take 1 tablet by mouth 2 (two) times daily.   cetirizine (ZYRTEC) 10 MG tablet TAKE ONE TABLET BY MOUTH EVERY DAY   conjugated estrogens (PREMARIN) vaginal cream Place 1 Applicatorful vaginally daily. For first 2 weeks then decrease to 3 times a week.   hydrochlorothiazide (HYDRODIURIL) 25 MG tablet TAKE ONE TABLET BY MOUTH EVERY  DAY   ibuprofen (ADVIL) 800 MG tablet Take 1 tablet (800 mg total) by mouth 3 (three) times daily with meals.   Multiple Vitamins-Minerals (MULTIVITAMIN ADULT PO) Take by mouth.   sucralfate (CARAFATE) 1 g tablet Take 1 tablet (1 g total) by mouth 4 (four) times daily -  with meals and at bedtime. Coats stomach   Turmeric 500 MG TABS Take by mouth.   valACYclovir (VALTREX) 1000 MG tablet Take 1 tablet (1,000 mg total) by mouth 3 (three) times daily.   vitamin B-12 (CYANOCOBALAMIN) 100 MCG tablet Take 100 mcg by mouth daily.   No facility-administered encounter medications on file as of 06/14/2021.    Observations/Objective: No direct observation done as this was a telephone encounter.  Assessment and Plan: 1. Essential hypertension Continue HCTZ Give appt with clinical pharmacist for BP check  2. Breast pain I told patient I did not this was due to the infection in her tooth.  She will be due for mammogram again in October of this year.  I told her that a plain x-ray would not help in this regard. - MM Digital Diagnostic Bilat; Future - Ambulatory referral to Breast Clinic  3. Dental cavity Advised her to keep her appointment with the endodontist    Follow Up Instructions: 2 mths   I discussed the assessment and treatment plan with the patient. The patient was provided an opportunity to ask questions and all were answered. The patient agreed  with the plan and demonstrated an understanding of the instructions.   The patient was advised to call back or seek an in-person evaluation if the symptoms worsen or if the condition fails to improve as anticipated.  I  Spent 11 minutes on this telephone encounter  Karle Plumber, MD

## 2021-06-17 ENCOUNTER — Telehealth: Payer: Self-pay | Admitting: Internal Medicine

## 2021-06-17 MED ORDER — CETIRIZINE HCL 10 MG PO TABS: 10.0000 mg | ORAL_TABLET | Freq: Every day | ORAL | 0 refills | Status: DC

## 2021-06-17 NOTE — Telephone Encounter (Signed)
Pt requesting medication for her allergies.

## 2021-06-17 NOTE — Telephone Encounter (Signed)
Copied from [place] [id number]. Topic: General - Other >> Jun 15, 2021 10:46 AM Pawlus, Brayton Layman A wrote: Reason for CRM: Pt called in stating her allergies are acting up, ears are bothering her and her eyes are watering, Pt just had a video visit with Dr Wynetta Emery yesterday (8/8), please advise if anything further can be sent in.

## 2021-06-28 ENCOUNTER — Telehealth: Payer: Self-pay | Admitting: *Deleted

## 2021-06-28 NOTE — Telephone Encounter (Signed)
-----   Message from Ladell Pier, MD sent at 06/14/2021  5:39 PM EDT ----- Give follow-up appointment with me in 2 months.  Give appointment with Lurena Joiner in 2 weeks for blood pressure check.

## 2021-06-28 NOTE — Telephone Encounter (Deleted)
-----   Message from Ladell Pier, MD sent at 06/14/2021  2:47 PM EDT ----- Give f/u appt with me in 2 mths

## 2021-06-28 NOTE — Telephone Encounter (Signed)
Returned pt call. Pt states she doesn't have an appt with the dentist till Sept 19. Pt is requesting an antibiotic and a prescription for yeast. Pt has been made an virtual appt with provider for 8/23 at 810am

## 2021-06-28 NOTE — Telephone Encounter (Signed)
Patient needs phone return call at [phone].  She is requesting antibiotic (Gabapentin) and some for yeast infection which will be caused by antibiotic.  Stated that she has requested twice.

## 2021-06-29 ENCOUNTER — Other Ambulatory Visit: Payer: Self-pay

## 2021-06-29 ENCOUNTER — Ambulatory Visit: Payer: Medicare Other | Attending: Internal Medicine | Admitting: Internal Medicine

## 2021-06-29 DIAGNOSIS — K029 Dental caries, unspecified: Secondary | ICD-10-CM | POA: Diagnosis not present

## 2021-06-29 MED ORDER — FLUCONAZOLE 150 MG PO TABS: 150.0000 mg | ORAL_TABLET | Freq: Once | ORAL | 0 refills | Status: AC

## 2021-06-29 MED ORDER — AMOXICILLIN-POT CLAVULANATE 500-125 MG PO TABS: 1.0000 | ORAL_TABLET | Freq: Two times a day (BID) | ORAL | 0 refills | Status: DC

## 2021-06-29 NOTE — Progress Notes (Signed)
Virtual Visit via Telephone Note  I connected with Janet Richards on 06/29/2021 at 8:19 a.m by telephone and verified that I am speaking with the correct person using two identifiers  Location: Patient: home Provider: office  Participants: Myself Patient   I discussed the limitations, risks, security and privacy concerns of performing an evaluation and management service by telephone and the availability of in person appointments. I also discussed with the patient that there may be a patient responsible charge related to this service. The patient expressed understanding and agreed to proceed.   History of Present Illness: Pt with hx of HTN, HL, OA knees, PreDM, GERD,asthma, OA knees.  This was an urgent care visit for dental pain.  Patient has a cavity in one of her left lower molar teeth.  She reports that the gum is swollen and painful.  She feels it is infected.  She has an appointment to have the tooth extracted on the 19th of next month.  She is requesting an antibiotic and Diflucan as she usually gets a yeast infection when she takes antibiotics.  She is requesting Augmentin instead of amoxicillin.  She states the amoxicillin does not work as well for her.    Outpatient Encounter Medications as of 06/29/2021  Medication Sig   amoxicillin-clavulanate (AUGMENTIN) 500-125 MG tablet Take 1 tablet (500 mg total) by mouth 2 (two) times daily.   fluconazole (DIFLUCAN) 150 MG tablet Take 1 tablet (150 mg total) by mouth once for 1 dose.   calcium-vitamin D 250-100 MG-UNIT tablet Take 1 tablet by mouth 2 (two) times daily.   cetirizine (ZYRTEC) 10 MG tablet Take 1 tablet (10 mg total) by mouth daily.   conjugated estrogens (PREMARIN) vaginal cream Place 1 Applicatorful vaginally daily. For first 2 weeks then decrease to 3 times a week.   hydrochlorothiazide (HYDRODIURIL) 25 MG tablet TAKE ONE TABLET BY MOUTH EVERY DAY   ibuprofen (ADVIL) 800 MG tablet Take 1 tablet (800 mg total) by mouth  3 (three) times daily with meals.   Multiple Vitamins-Minerals (MULTIVITAMIN ADULT PO) Take by mouth.   sucralfate (CARAFATE) 1 g tablet Take 1 tablet (1 g total) by mouth 4 (four) times daily -  with meals and at bedtime. Coats stomach   Turmeric 500 MG TABS Take by mouth.   valACYclovir (VALTREX) 1000 MG tablet Take 1 tablet (1,000 mg total) by mouth 3 (three) times daily.   vitamin B-12 (CYANOCOBALAMIN) 100 MCG tablet Take 100 mcg by mouth daily.   [DISCONTINUED] amoxicillin (AMOXIL) 875 MG tablet Take 1 tablet (875 mg total) by mouth 2 (two) times daily.   No facility-administered encounter medications on file as of 06/29/2021.      Observations/Objective: No direct observation done as this was a telephone visit.  Assessment and Plan: 1. Pain due to dental caries Prescription sent for Augmentin.  Advised to keep appointment with dentist next month.  Prescription for Diflucan also sent for her to use should she develop a vaginal yeast infection. - amoxicillin-clavulanate (AUGMENTIN) 500-125 MG tablet; Take 1 tablet (500 mg total) by mouth 2 (two) times daily.  Dispense: 14 tablet; Refill: 0   Follow Up Instructions: As needed   I discussed the assessment and treatment plan with the patient. The patient was provided an opportunity to ask questions and all were answered. The patient agreed with the plan and demonstrated an understanding of the instructions.   The patient was advised to call back or seek an in-person evaluation if the  symptoms worsen or if the condition fails to improve as anticipated.  I  Spent 6 minutes on this telephone encounter  Karle Plumber, MD

## 2021-06-30 ENCOUNTER — Other Ambulatory Visit: Payer: Self-pay | Admitting: Internal Medicine

## 2021-06-30 DIAGNOSIS — N644 Mastodynia: Secondary | ICD-10-CM

## 2021-07-08 ENCOUNTER — Telehealth: Payer: Self-pay | Admitting: Internal Medicine

## 2021-07-08 NOTE — Telephone Encounter (Signed)
Copied from [place] [phone]. Topic: General - Other >> Jul 07, 2021  8:24 AM Janet Richards wrote: Reason for CRM: Pt called in stating she will have to be switching providers due to location and wanted to let dr. Wynetta Emery know that she thanks her, and appreciated all she did for her, and that she was a great doctor.

## 2021-07-11 DIAGNOSIS — Z20822 Contact with and (suspected) exposure to covid-19: Secondary | ICD-10-CM | POA: Diagnosis not present

## 2021-07-16 ENCOUNTER — Other Ambulatory Visit: Payer: Self-pay | Admitting: Internal Medicine

## 2021-07-16 NOTE — Telephone Encounter (Signed)
Requested medication (s) are due for refill today - yes  Requested medication (s) are on the active medication list -yes  Future visit scheduled -no  Last refill: 04/13/21  Notes to clinic: request fails all lab protocol- sent for review   Requested Prescriptions  Pending Prescriptions Disp Refills   hydrochlorothiazide (HYDRODIURIL) 25 MG tablet [Pharmacy Med Name: hydrochlorothiazide 25 mg tablet] 90 tablet 0    Sig: TAKE ONE TABLET BY MOUTH EVERY DAY     Cardiovascular: Diuretics - Thiazide Failed - 07/16/2021  8:19 AM      Failed - Ca in normal range and within 360 days    Calcium  Date Value Ref Range Status  04/09/2020 10.6 (H) 8.6 - 10.4 mg/dL Final          Failed - Cr in normal range and within 360 days    Creat  Date Value Ref Range Status  04/09/2020 0.87 0.50 - 1.05 mg/dL Final    Comment:    For patients >91 years of age, the reference limit for Creatinine is approximately 13% higher for people identified as African-American. .           Failed - K in normal range and within 360 days    Potassium  Date Value Ref Range Status  04/09/2020 4.0 3.5 - 5.3 mmol/L Final          Failed - Na in normal range and within 360 days    Sodium  Date Value Ref Range Status  04/09/2020 138 135 - 146 mmol/L Final          Failed - Last BP in normal range    BP Readings from Last 1 Encounters:  06/08/21 (!) 156/89          Passed - Valid encounter within last 6 months    Recent Outpatient Visits           2 weeks ago Pain due to dental caries   [place], Dalbert Batman, MD   1 month ago Essential hypertension   [place], MD   3 months ago Encounter to establish care   [place], Deborah B, MD   1 year ago Periodontitis   [facility], [place], DO   1 year ago Fungal infection of skin   [facility], Coralee Pesa, NP                 Requested Prescriptions  Pending Prescriptions Disp Refills   hydrochlorothiazide (HYDRODIURIL) 25 MG tablet [Pharmacy Med Name: hydrochlorothiazide 25 mg tablet] 90 tablet 0    Sig: TAKE ONE TABLET BY MOUTH EVERY DAY     Cardiovascular: Diuretics - Thiazide Failed - 07/16/2021  8:19 AM      Failed - Ca in normal range and within 360 days    Calcium  Date Value Ref Range Status  04/09/2020 10.6 (H) 8.6 - 10.4 mg/dL Final          Failed - Cr in normal range and within 360 days    Creat  Date Value Ref Range Status  04/09/2020 0.87 0.50 - 1.05 mg/dL Final    Comment:    For patients >54 years of age, the reference limit for Creatinine is approximately 13% higher for people identified as African-American. [name]  Failed - K in normal range and within 360 days    Potassium  Date Value Ref Range Status  04/09/2020 4.0 3.5 - 5.3 mmol/L Final          Failed - Na in normal range and within 360 days    Sodium  Date Value Ref Range Status  04/09/2020 138 135 - 146 mmol/L Final          Failed - Last BP in normal range    BP Readings from Last 1 Encounters:  06/08/21 (!) 156/89          Passed - Valid encounter within last 6 months    Recent Outpatient Visits           2 weeks ago Pain due to dental caries   [place], MD   1 month ago Essential hypertension   [place], MD   3 months ago Encounter to establish care   [place], MD   1 year ago Periodontitis   [facility] [place], DO   1 year ago Fungal infection of skin   [place] [facility], Coralee Pesa, NP

## 2021-07-19 ENCOUNTER — Ambulatory Visit: Payer: Medicare Other | Admitting: Pharmacist

## 2021-07-27 DIAGNOSIS — K219 Gastro-esophageal reflux disease without esophagitis: Secondary | ICD-10-CM | POA: Diagnosis not present

## 2021-07-27 DIAGNOSIS — R7303 Prediabetes: Secondary | ICD-10-CM | POA: Diagnosis not present

## 2021-07-27 DIAGNOSIS — E785 Hyperlipidemia, unspecified: Secondary | ICD-10-CM | POA: Diagnosis not present

## 2021-07-27 DIAGNOSIS — I160 Hypertensive urgency: Secondary | ICD-10-CM | POA: Diagnosis not present

## 2021-08-23 DIAGNOSIS — I10 Essential (primary) hypertension: Secondary | ICD-10-CM | POA: Diagnosis not present

## 2021-08-23 DIAGNOSIS — R350 Frequency of micturition: Secondary | ICD-10-CM | POA: Diagnosis not present

## 2021-08-23 DIAGNOSIS — R1011 Right upper quadrant pain: Secondary | ICD-10-CM | POA: Diagnosis not present

## 2021-08-23 DIAGNOSIS — N6459 Other signs and symptoms in breast: Secondary | ICD-10-CM | POA: Diagnosis not present

## 2021-08-23 DIAGNOSIS — N952 Postmenopausal atrophic vaginitis: Secondary | ICD-10-CM | POA: Diagnosis not present

## 2021-08-23 DIAGNOSIS — R61 Generalized hyperhidrosis: Secondary | ICD-10-CM | POA: Diagnosis not present

## 2021-08-23 DIAGNOSIS — K219 Gastro-esophageal reflux disease without esophagitis: Secondary | ICD-10-CM | POA: Diagnosis not present

## 2021-08-23 DIAGNOSIS — R351 Nocturia: Secondary | ICD-10-CM | POA: Diagnosis not present

## 2021-08-24 DIAGNOSIS — J019 Acute sinusitis, unspecified: Secondary | ICD-10-CM | POA: Diagnosis not present

## 2021-08-24 DIAGNOSIS — B9689 Other specified bacterial agents as the cause of diseases classified elsewhere: Secondary | ICD-10-CM | POA: Diagnosis not present

## 2021-08-24 DIAGNOSIS — I10 Essential (primary) hypertension: Secondary | ICD-10-CM | POA: Diagnosis not present

## 2021-09-17 DIAGNOSIS — M179 Osteoarthritis of knee, unspecified: Secondary | ICD-10-CM | POA: Insufficient documentation

## 2021-09-17 DIAGNOSIS — M545 Low back pain, unspecified: Secondary | ICD-10-CM | POA: Diagnosis not present

## 2021-09-17 DIAGNOSIS — Z9851 Tubal ligation status: Secondary | ICD-10-CM

## 2021-09-17 DIAGNOSIS — M542 Cervicalgia: Secondary | ICD-10-CM | POA: Diagnosis not present

## 2021-09-17 DIAGNOSIS — R200 Anesthesia of skin: Secondary | ICD-10-CM | POA: Diagnosis not present

## 2021-09-17 DIAGNOSIS — M7918 Myalgia, other site: Secondary | ICD-10-CM | POA: Diagnosis not present

## 2021-09-17 HISTORY — DX: Osteoarthritis of knee, unspecified: M17.9

## 2021-09-17 HISTORY — DX: Tubal ligation status: Z98.51

## 2021-09-27 DIAGNOSIS — B999 Unspecified infectious disease: Secondary | ICD-10-CM | POA: Diagnosis not present

## 2021-09-27 DIAGNOSIS — K219 Gastro-esophageal reflux disease without esophagitis: Secondary | ICD-10-CM | POA: Diagnosis not present

## 2021-09-27 DIAGNOSIS — R0789 Other chest pain: Secondary | ICD-10-CM | POA: Diagnosis not present

## 2021-09-27 DIAGNOSIS — K068 Other specified disorders of gingiva and edentulous alveolar ridge: Secondary | ICD-10-CM | POA: Diagnosis not present

## 2021-10-04 DIAGNOSIS — Z1231 Encounter for screening mammogram for malignant neoplasm of breast: Secondary | ICD-10-CM | POA: Diagnosis not present

## 2021-10-12 DIAGNOSIS — M791 Myalgia, unspecified site: Secondary | ICD-10-CM | POA: Diagnosis not present

## 2021-10-30 ENCOUNTER — Emergency Department (INDEPENDENT_AMBULATORY_CARE_PROVIDER_SITE_OTHER)
Admission: EM | Admit: 2021-10-30 | Discharge: 2021-10-30 | Disposition: A | Discharge status: Home / Self Care | Payer: Medicare Other | Source: Home / Self Care | Attending: Family Medicine | Admitting: Family Medicine

## 2021-10-30 ENCOUNTER — Other Ambulatory Visit: Payer: Self-pay

## 2021-10-30 DIAGNOSIS — J0100 Acute maxillary sinusitis, unspecified: Secondary | ICD-10-CM

## 2021-10-30 MED ORDER — FLUTICASONE PROPIONATE 50 MCG/ACT NA SUSP: 2.0000 | Freq: Every day | NASAL | 0 refills | Status: DC

## 2021-10-30 MED ORDER — AMOXICILLIN-POT CLAVULANATE 875-125 MG PO TABS: 1.0000 | ORAL_TABLET | Freq: Two times a day (BID) | ORAL | 0 refills | Status: DC

## 2021-10-30 MED ORDER — FLUCONAZOLE 150 MG PO TABS
150.0000 mg | ORAL_TABLET | Freq: Every day | ORAL | 0 refills | Status: DC
Start: 2021-10-30 — End: 2022-03-25

## 2021-10-30 NOTE — ED Provider Notes (Signed)
Janet Richards CARE    CSN: [id number] Arrival date & time: 10/30/21  0915      History   Chief Complaint Chief Complaint  Patient presents with   Oral Swelling   Cough   Sore Throat    With coughing    HPI Janet Richards is a 60 y.o. female.   HPI  Patient comes to the office frequently.  Currently is here for an upper respiratory infection.  She states that she has a dental infection with swelling and pain in her gums.  In addition she has some cough and sore throat.  Her symptoms have been going on for about a week week.  She does have a follow-up with a dentist but is not till early January  Past Medical History:  Diagnosis Date   Asthma    Chronic pancreatitis ([place])    Essential hypertension, benign    Menstrual migraine    Periodontitis    Prediabetes 10/17/2014   Primary osteoarthritis of both knees    Sinusitis    Urticaria     Patient Active Problem List   Diagnosis Date Noted   Vaccine counseling 04/09/2020   Palpitations 03/09/2020   Irritation of ear, bilateral 03/09/2020   Generalized headaches 02/18/2020   GERD (gastroesophageal reflux disease) 02/18/2020   Dysuria 09/16/2019   Chronic pancreatitis ([place]) 04/10/2019   Environmental allergies 09/12/2018   Mild intermittent asthma without complication 51/12/5850   Dyspepsia 07/31/2018   Sensory neuronopathy 07/29/2018   Post-menopausal bleeding 05/21/2018   DDD (degenerative disc disease), cervical 02/22/2018   Periodontitis 12/15/2017   Vaginal dryness, menopausal 03/01/2017   Abnormal ankle brachial index (ABI) 11/09/2016   Numbness and tingling of both feet 09/02/2016   Ganglion cyst of right foot 07/12/2016   History of colonic polyps 03/20/2015   Hyperlipidemia 02/20/2015   Other allergic rhinitis 01/14/2015   Prediabetes 10/17/2014   Osteoarthritis of first metatarsophalangeal joint 04/11/2014   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 Authorizing Provider  amoxicillin-clavulanate (AUGMENTIN) 875-125 MG tablet Take 1 tablet by mouth every 12 (twelve) hours. 10/30/21  Yes Raylene Everts, MD  fluconazole (DIFLUCAN) 150 MG tablet Take 1 tablet (150 mg total) by mouth daily. Repeat in 1 week if needed 10/30/21  Yes Raylene Everts, MD  fluticasone [facility]) 50 MCG/ACT nasal spray Place 2 sprays into both nostrils daily. 10/30/21  Yes Raylene Everts, MD  calcium-vitamin D 250-100 MG-UNIT tablet Take 1 tablet by mouth 2 (two) times daily.    [provider]  cetirizine (ZYRTEC) 10 MG tablet Take 1 tablet (10 mg total) by mouth daily. 06/17/21   Ladell Pier, MD  hydrochlorothiazide (HYDRODIURIL) 25 MG tablet TAKE ONE TABLET BY MOUTH  EVERY DAY 07/16/21   Ladell Pier, MD  ibuprofen (ADVIL) 800 MG tablet Take 1 tablet (800 mg total) by mouth 3 (three) times daily with meals. 04/10/21   Vanessa Kick, MD  Multiple Vitamins-Minerals (MULTIVITAMIN ADULT PO) Take by mouth.    [provider]  sucralfate (CARAFATE) 1 g tablet Take 1 tablet (1 g total) by mouth 4 (four) times daily -  with meals and at bedtime. Coats stomach 06/08/21   Raylene Everts, MD  Turmeric 500 MG TABS Take by mouth.    [provider]  vitamin B-12 (CYANOCOBALAMIN) 100 MCG tablet Take 100 mcg by mouth daily.    [provider]    Family History Family History  Problem Relation Age of Onset   Hypertension Mother    CAD Father        MI in his 27s   Allergic rhinitis  Father    Urticaria Sister    Colon cancer Sister    Allergic rhinitis Son    Asthma Son    Angioedema Neg Hx    Eczema Neg Hx    Immunodeficiency Neg Hx    Esophageal cancer Neg Hx    Stomach cancer Neg Hx    Rectal cancer Neg Hx     Social History Social History   Tobacco Use   Smoking status: Former    Packs/day: 1.50    Types: Cigarettes    Quit date: 02/05/2006    Years since quitting: 15.7   Smokeless tobacco: Never  Vaping Use   Vaping Use: Never used  Substance Use Topics   Alcohol use: Yes    Comment: rarely   Drug use: No     Allergies   Azithromycin and Clindamycin/lincomycin   Review of Systems Review of Systems  See HPI Physical Exam Triage Vital Signs ED Triage Vitals  Enc Vitals Group     BP 10/30/21 1025 128/84     Pulse Rate 10/30/21 1025 95     Resp 10/30/21 1025 14     Temp 10/30/21 1025 98.5 F (36.9 C)     Temp Source 10/30/21 1025 Oral     SpO2 10/30/21 1025 97 %     Weight 10/30/21 1026 184 lb (83.5 kg)     Height --      Head Circumference --      Peak Flow --      Pain Score 10/30/21 1026 0     Pain Loc --      Pain Edu? --      Excl. in [place]? --    No data found.  Updated Vital Signs BP 128/84 (BP Location: Left Arm)    Pulse 95    Temp 98.5 F (36.9 C) (Oral)    Resp 14    Wt 83.5 kg    LMP 09/24/2017    SpO2 97%    BMI 32.59 kg/m      Physical Exam Constitutional:      General: She is not in acute distress.    Appearance: She is well-developed.  HENT:     Head: Normocephalic and atraumatic.     Right Ear: Tympanic membrane normal.     Left Ear: Tympanic membrane and ear canal normal.     Nose: Congestion present.     Mouth/Throat:     Pharynx: Posterior oropharyngeal erythema present.     Comments: Upper right molar area is swollen and erythematous.  Teeth are all absent Eyes:  Conjunctiva/sclera: Conjunctivae normal.     Pupils: Pupils are equal, round, and reactive to light.  Cardiovascular:     Rate and  Rhythm: Normal rate and regular rhythm.     Heart sounds: Normal heart sounds.  Pulmonary:     Effort: Pulmonary effort is normal. No respiratory distress.     Breath sounds: Normal breath sounds.  Abdominal:     General: There is no distension.     Palpations: Abdomen is soft.  Musculoskeletal:        General: Normal range of motion.     Cervical back: Normal range of motion.  Lymphadenopathy:     Cervical: No cervical adenopathy.  Skin:    General: Skin is warm and dry.  Neurological:     Mental Status: She is alert.  Psychiatric:        Mood and Affect: Mood normal.        Behavior: Behavior normal.     UC Treatments / Results  Labs (all labs ordered are listed, but only abnormal results are displayed) Labs Reviewed - No data to display  EKG   Radiology No results found.  Procedures Procedures (including critical care time)  Medications Ordered in UC Medications - No data to display  Initial Impression / Assessment and Plan / UC Course  I have reviewed the triage vital signs and the nursing notes.  Pertinent labs & imaging results that were available during my care of the patient were reviewed by me and considered in my medical decision making (see chart for details).     The patient needs antibiotics for her dental and gum infection.  She has chronic periodontitis.  She is under the care of a dentist. Final Clinical Impressions(s) / UC Diagnoses   Final diagnoses:  Acute non-recurrent maxillary sinusitis     Discharge Instructions      Make sure you are drinking lots of water Use Flonase twice a day for 3 days then once a day until symptoms improve Take Augmentin 2 times a day.  Take 2 doses today Take a probiotic while on Augmentin Take Diflucan if needed   ED Prescriptions     Medication Sig Dispense Auth. Provider   amoxicillin-clavulanate (AUGMENTIN) 875-125 MG tablet Take 1 tablet by mouth every 12 (twelve) hours. 14 tablet Raylene Everts, MD   fluconazole (DIFLUCAN) 150 MG tablet Take 1 tablet (150 mg total) by mouth daily. Repeat in 1 week if needed 2 tablet Raylene Everts, MD   fluticasone [facility]) 50 MCG/ACT nasal spray Place 2 sprays into both nostrils daily. 16 g Raylene Everts, MD      PDMP not reviewed this encounter.   Raylene Everts, MD 10/30/21 [phone]

## 2021-10-30 NOTE — ED Triage Notes (Signed)
Pt presents with cough and sore throat with coughing x1 week and gum swelling x 1.5 weeks

## 2021-10-30 NOTE — Discharge Instructions (Addendum)
Make sure you are drinking lots of water Use Flonase twice a day for 3 days then once a day until symptoms improve Take Augmentin 2 times a day.  Take 2 doses today Take a probiotic while on Augmentin Take Diflucan if needed

## 2021-11-17 ENCOUNTER — Emergency Department (INDEPENDENT_AMBULATORY_CARE_PROVIDER_SITE_OTHER)
Admission: EM | Admit: 2021-11-17 | Discharge: 2021-11-17 | Disposition: A | Discharge status: Home / Self Care | Payer: Medicare Other | Source: Home / Self Care | Attending: Family Medicine | Admitting: Family Medicine

## 2021-11-17 ENCOUNTER — Emergency Department (INDEPENDENT_AMBULATORY_CARE_PROVIDER_SITE_OTHER): Payer: Medicare Other

## 2021-11-17 ENCOUNTER — Other Ambulatory Visit: Payer: Self-pay

## 2021-11-17 DIAGNOSIS — R109 Unspecified abdominal pain: Secondary | ICD-10-CM

## 2021-11-17 DIAGNOSIS — R1084 Generalized abdominal pain: Secondary | ICD-10-CM

## 2021-11-17 LAB — POCT URINALYSIS DIP (MANUAL ENTRY)
Bilirubin, UA: NEGATIVE
Glucose, UA: NEGATIVE mg/dL
Ketones, POC UA: NEGATIVE mg/dL
Leukocytes, UA: NEGATIVE
Nitrite, UA: NEGATIVE
Protein Ur, POC: NEGATIVE mg/dL
Spec Grav, UA: 1.01 (ref 1.010–1.025)
Urobilinogen, UA: 0.2 E.U./dL
pH, UA: 7 (ref 5.0–8.0)

## 2021-11-17 NOTE — ED Triage Notes (Signed)
Pt states that she has some abdominal pain. Pt states that she has some navel pain. Pt states that the right and left side of her abdomen hurts as well. X2 weeks

## 2021-11-17 NOTE — ED Provider Notes (Signed)
Janet Richards CARE    CSN: [id number] Arrival date & time: 11/17/21  1432      History   Chief Complaint Chief Complaint  Patient presents with   Abdominal Pain    Abdominal pain x2 weeks    HPI Janet Richards is a 61 y.o. female.   HPI  Patient is here complaining of abdominal pain.  She states it hurts generally around her navel.  The pain has been present daily for 2 weeks.  No vomiting or change in appetite.  No change in weight.  No constipation or diarrhea.  No fever or chills.  She has a history of some acid reflux.  Takes Pepcid as needed.  No change in diet, denies alcohol, denies tobacco  Past Medical History:  Diagnosis Date   Asthma    Chronic pancreatitis ([place])    Essential hypertension, benign    Menstrual migraine    Periodontitis    Prediabetes 10/17/2014   Primary osteoarthritis of both knees    Sinusitis    Urticaria     Patient Active Problem List   Diagnosis Date Noted   Vaccine counseling 04/09/2020   Palpitations 03/09/2020   Irritation of ear, bilateral 03/09/2020   Generalized headaches 02/18/2020   GERD (gastroesophageal reflux disease) 02/18/2020   Dysuria 09/16/2019   Chronic pancreatitis ([place]) 04/10/2019   Environmental allergies 09/12/2018   Mild intermittent asthma without complication 35/32/9924   Dyspepsia 07/31/2018   Sensory neuronopathy 07/29/2018   Post-menopausal bleeding 05/21/2018   DDD (degenerative disc disease), cervical 02/22/2018   Periodontitis 12/15/2017   Vaginal dryness, menopausal 03/01/2017   Abnormal ankle brachial index (ABI) 11/09/2016   Numbness and tingling of both feet 09/02/2016   Ganglion cyst of right foot 07/12/2016   History of colonic polyps 03/20/2015   Hyperlipidemia 02/20/2015   Other allergic rhinitis 01/14/2015   Prediabetes 10/17/2014   Osteoarthritis of first metatarsophalangeal joint 04/11/2014   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 Authorizing Provider  calcium-vitamin D 250-100 MG-UNIT tablet Take 1 tablet by mouth 2 (two) times daily.   Yes [provider]  cetirizine (ZYRTEC) 10 MG tablet Take 1 tablet (10 mg total) by mouth daily. 06/17/21  Yes Ladell Pier, MD  fluconazole (DIFLUCAN) 150 MG tablet Take 1 tablet (150 mg total) by mouth daily. Repeat in 1 week if needed 10/30/21  Yes Raylene Everts, MD  hydrochlorothiazide (HYDRODIURIL) 25 MG tablet TAKE ONE TABLET BY MOUTH EVERY DAY 07/16/21  Yes Ladell Pier, MD  ibuprofen (ADVIL) 800 MG tablet Take 1 tablet (800 mg total) by mouth 3 (three) times daily with meals. 04/10/21  Yes Vanessa Kick, MD  Multiple Vitamins-Minerals (MULTIVITAMIN ADULT PO) Take by mouth.  Yes [provider]  Turmeric 500 MG TABS Take by mouth.   Yes [provider]  vitamin B-12 (CYANOCOBALAMIN) 100 MCG tablet Take 100 mcg by mouth daily.   Yes [provider]    Family History Family History  Problem Relation Age of Onset   Hypertension Mother    CAD Father        MI in his 70s   Allergic rhinitis Father    Urticaria Sister    Colon cancer Sister    Allergic rhinitis Son    Asthma Son    Angioedema Neg Hx    Eczema Neg Hx    Immunodeficiency Neg Hx    Esophageal cancer Neg Hx    Stomach cancer Neg Hx    Rectal cancer Neg Hx     Social History Social History   Tobacco Use   Smoking status: Former    Packs/day: 1.50    Types: Cigarettes    Quit date: 02/05/2006    Years since quitting:  15.7   Smokeless tobacco: Never  Vaping Use   Vaping Use: Never used  Substance Use Topics   Alcohol use: Yes    Comment: rarely   Drug use: No     Allergies   Azithromycin and Clindamycin/lincomycin   Review of Systems Review of Systems  See HPI Physical Exam Triage Vital Signs ED Triage Vitals  Enc Vitals Group     BP 11/17/21 1507 132/83     Pulse Rate 11/17/21 1507 96     Resp 11/17/21 1507 18     Temp 11/17/21 1507 98.5 F (36.9 C)     Temp src --      SpO2 11/17/21 1507 97 %     Weight 11/17/21 1504 190 lb (86.2 kg)     Height 11/17/21 1504 5\' 3"  (1.6 m)     Head Circumference --      Peak Flow --      Pain Score 11/17/21 1503 6     Pain Loc --      Pain Edu? --      Excl. in [place]? --    No data found.  Updated Vital Signs BP 132/83 (BP Location: Right Arm)    Pulse 96    Temp 98.5 F (36.9 C)    Resp 18    Ht 5\' 3"  (1.6 m)    Wt 86.2 kg    LMP 09/24/2017    SpO2 97%    BMI 33.66 kg/m      Physical Exam Constitutional:      General: She is not in acute distress.    Appearance: Normal appearance. She is well-developed.     Comments: Overweight.  Appears comfortable  HENT:     Head: Normocephalic and atraumatic.  Eyes:     Conjunctiva/sclera: Conjunctivae normal.     Pupils: Pupils are equal, round, and reactive to light.  Cardiovascular:     Rate and Rhythm: Normal rate and regular rhythm.     Heart sounds: Normal heart sounds.  Pulmonary:     Effort: Pulmonary effort is normal. No respiratory distress.  Abdominal:     General: Bowel sounds are normal. There is no distension.     Palpations: Abdomen is soft.     Tenderness: There is no abdominal tenderness. There is no guarding or rebound.     Comments: Rounded abdomen.  Soft.  Bowel sounds are active.  No acute tenderness guarding or rebound  Musculoskeletal:  General: Normal range of motion.     Cervical back: Normal range of motion.  Skin:    General: Skin is warm and dry.   Neurological:     General: No focal deficit present.     Mental Status: She is alert.     UC Treatments / Results  Labs (all labs ordered are listed, but only abnormal results are displayed) Labs Reviewed  POCT URINALYSIS DIP (MANUAL ENTRY) - Abnormal; Notable for the following components:      Result Value   Blood, UA trace-intact (*)    All other components within normal limits    EKG   Radiology DG Abdomen 1 View  Result Date: 11/17/2021 CLINICAL DATA:  Abdominal pain. EXAM: ABDOMEN - 1 VIEW COMPARISON:  04/01/2017 FINDINGS: Gas and an ordinary amount of stool are present in the colon. No dilated loops of bowel are seen to suggest obstruction. No radiopaque urinary tract calculi are identified. The visualized lung bases are clear. Prominent facet arthrosis is noted in the lower lumbar spine. IMPRESSION: Nonobstructed bowel gas pattern. Electronically Signed   By: Logan Bores M.D.   On: 11/17/2021 15:56    Procedures Procedures (including critical care time)  Medications Ordered in UC Medications - No data to display  Initial Impression / Assessment and Plan / UC Course  I have reviewed the triage vital signs and the nursing notes.  Pertinent labs & imaging results that were available during my care of the patient were reviewed by me and considered in my medical decision making (see chart for details).     It is possible that her abdomenIs hurting because of her recent Augmentin use.  Recommend probiotics and follow-up with primary care.  No acute or serious condition suspected. Final Clinical Impressions(s) / UC Diagnoses   Final diagnoses:  Generalized abdominal pain     Discharge Instructions      Abdominal x-ray is negative.  No constipation or blockage identified I believe you have abdominal pain from the Augmentin antibiotic.  It can cause diarrhea and general pain. Now that you are off the Augmentin, try a probiotic daily.  If you are already on one, you  may need to double it Follow-up with your PCP-it is important to see a primary care doctor regularly to try to coordinate your medical care   ED Prescriptions   None    PDMP not reviewed this encounter.   Raylene Everts, MD 11/17/21 [phone]

## 2021-11-17 NOTE — Discharge Instructions (Addendum)
Abdominal x-ray is negative.  No constipation or blockage identified I believe you have abdominal pain from the Augmentin antibiotic.  It can cause diarrhea and general pain. Now that you are off the Augmentin, try a probiotic daily.  If you are already on one, you may need to double it Follow-up with your PCP-it is important to see a primary care doctor regularly to try to coordinate your medical care

## 2021-11-23 DIAGNOSIS — B9689 Other specified bacterial agents as the cause of diseases classified elsewhere: Secondary | ICD-10-CM | POA: Diagnosis not present

## 2021-11-23 DIAGNOSIS — J019 Acute sinusitis, unspecified: Secondary | ICD-10-CM | POA: Diagnosis not present

## 2021-11-30 ENCOUNTER — Ambulatory Visit: Payer: Medicare Other | Admitting: Internal Medicine

## 2021-12-03 ENCOUNTER — Telehealth: Payer: Medicare Other | Admitting: Internal Medicine

## 2021-12-03 ENCOUNTER — Ambulatory Visit: Payer: Medicare Other | Admitting: Internal Medicine

## 2021-12-13 ENCOUNTER — Ambulatory Visit: Payer: Medicare Other | Admitting: Internal Medicine

## 2021-12-15 ENCOUNTER — Emergency Department (INDEPENDENT_AMBULATORY_CARE_PROVIDER_SITE_OTHER)
Admission: EM | Admit: 2021-12-15 | Discharge: 2021-12-15 | Disposition: A | Discharge status: Home / Self Care | Payer: Medicare Other | Source: Home / Self Care

## 2021-12-15 ENCOUNTER — Other Ambulatory Visit: Payer: Self-pay

## 2021-12-15 DIAGNOSIS — R519 Headache, unspecified: Secondary | ICD-10-CM

## 2021-12-15 DIAGNOSIS — R0981 Nasal congestion: Secondary | ICD-10-CM

## 2021-12-15 DIAGNOSIS — Z9109 Other allergy status, other than to drugs and biological substances: Secondary | ICD-10-CM | POA: Diagnosis not present

## 2021-12-15 MED ORDER — KETOROLAC TROMETHAMINE 60 MG/2ML IM SOLN
60.0000 mg | Freq: Once | INTRAMUSCULAR | Status: AC
  Administered 2021-12-15: 60 mg via INTRAMUSCULAR

## 2021-12-15 MED ORDER — PREDNISONE 20 MG PO TABS: 20.0000 mg | ORAL_TABLET | Freq: Two times a day (BID) | ORAL | 0 refills | Status: DC

## 2021-12-15 NOTE — ED Provider Notes (Signed)
[facility]    CSN: [id number] Arrival date & time: 12/15/21  1738      History   Chief Complaint Chief Complaint  Patient presents with   Sinus issues    HPI Janet Richards is a 61 y.o. female.   HPI  Patient is well-known from frequent urgent care visits She was treated for a sinus infection on 11/23/2021 with 10 days of Augmentin.  She is only been off the antibiotic for a week.  She has some sinus and nasal congestion.  Clear runny nose.  Sinus headache.  No sore throat or cough.  She is here thinking she needs antibiotics again.  She is on Zyrtec for her allergies.  Also uses Astelin nasal spray.  Past Medical History:  Diagnosis Date   Asthma    Chronic pancreatitis ([facility])    Essential hypertension, benign    Menstrual migraine    Periodontitis    Prediabetes 10/17/2014   Primary osteoarthritis of both knees    Sinusitis    Urticaria     Patient Active Problem List   Diagnosis Date Noted   Vaccine counseling 04/09/2020   Palpitations 03/09/2020   Irritation of ear, bilateral 03/09/2020   Generalized headaches 02/18/2020   GERD (gastroesophageal reflux disease) 02/18/2020   Dysuria 09/16/2019   Chronic pancreatitis ([place]) 04/10/2019   Environmental allergies 09/12/2018   Mild intermittent asthma without complication 40/98/1191   Dyspepsia 07/31/2018   Sensory neuronopathy 07/29/2018   Post-menopausal bleeding 05/21/2018   DDD (degenerative disc disease), cervical 02/22/2018   Periodontitis 12/15/2017   Vaginal dryness, menopausal 03/01/2017   Abnormal ankle brachial index (ABI) 11/09/2016   Numbness and tingling of both feet 09/02/2016   Ganglion cyst of right foot 07/12/2016   History of colonic polyps 03/20/2015   Hyperlipidemia 02/20/2015   Other allergic rhinitis 01/14/2015   Prediabetes 10/17/2014   Osteoarthritis of first metatarsophalangeal joint 04/11/2014   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 Authorizing Provider  predniSONE (DELTASONE) 20 MG tablet Take 1 tablet (20 mg total) by mouth 2 (two) times daily with a meal. 12/15/21  Yes Raylene Everts, MD  calcium-vitamin D 250-100 MG-UNIT tablet Take 1 tablet by mouth 2 (two) times daily.    [provider]  cetirizine (ZYRTEC) 10 MG tablet Take 1 tablet (10 mg total) by mouth daily. 06/17/21   Ladell Pier, MD  fluconazole (DIFLUCAN) 150 MG tablet Take 1 tablet (150 mg total) by mouth daily. Repeat in 1 week if needed 10/30/21   Raylene Everts, MD  hydrochlorothiazide (HYDRODIURIL) 25 MG tablet TAKE ONE TABLET BY MOUTH EVERY DAY 07/16/21   Ladell Pier, MD  ibuprofen (ADVIL) 800 MG tablet Take 1 tablet (  800 mg total) by mouth 3 (three) times daily with meals. 04/10/21   Vanessa Kick, MD  Multiple Vitamins-Minerals (MULTIVITAMIN ADULT PO) Take by mouth.    [provider]  Turmeric 500 MG TABS Take by mouth.    [provider]  vitamin B-12 (CYANOCOBALAMIN) 100 MCG tablet Take 100 mcg by mouth daily.    [provider]    Family History Family History  Problem Relation Age of Onset   Hypertension Mother    CAD Father        MI in his 57s   Allergic rhinitis Father    Urticaria Sister    Colon cancer Sister    Allergic rhinitis Son    Asthma Son    Angioedema Neg Hx    Eczema Neg Hx    Immunodeficiency Neg Hx    Esophageal cancer Neg Hx    Stomach cancer Neg Hx    Rectal cancer Neg Hx     Social History Social History    Tobacco Use   Smoking status: Former    Packs/day: 1.50    Types: Cigarettes    Quit date: 02/05/2006    Years since quitting: 15.8   Smokeless tobacco: Never  Vaping Use   Vaping Use: Never used  Substance Use Topics   Alcohol use: Yes    Comment: rarely   Drug use: No     Allergies   Azithromycin and Clindamycin/lincomycin   Review of Systems Review of Systems   Physical Exam Triage Vital Signs ED Triage Vitals  Enc Vitals Group     BP 12/15/21 1748 136/82     Pulse Rate 12/15/21 1748 84     Resp 12/15/21 1748 18     Temp 12/15/21 1748 98.5 F (36.9 C)     Temp Source 12/15/21 1748 Oral     SpO2 12/15/21 1748 97 %     Weight --      Height --      Head Circumference --      Peak Flow --      Pain Score 12/15/21 1753 9     Pain Loc --      Pain Edu? --      Excl. in [place]? --    No data found.  Updated Vital Signs BP 136/82 (BP Location: Right Arm)    Pulse 84    Temp 98.5 F (36.9 C) (Oral)    Resp 18    LMP 09/24/2017    SpO2 97%    Physical Exam Constitutional:      General: She is not in acute distress.    Appearance: She is well-developed.     Comments: Appears tired.  Somewhat subdued secondary to headache  HENT:     Head: Normocephalic and atraumatic.     Right Ear: Tympanic membrane and ear canal normal.     Left Ear: Tympanic membrane and ear canal normal.     Nose: Congestion and rhinorrhea present.     Comments: Nasal membranes swollen and red.  clear rhinorrhea    Mouth/Throat:     Mouth: Mucous membranes are moist.     Pharynx: No posterior oropharyngeal erythema.  Eyes:     Conjunctiva/sclera: Conjunctivae normal.     Pupils: Pupils are equal, round, and reactive to light.  Cardiovascular:     Rate and Rhythm: Normal rate.  Pulmonary:     Effort: Pulmonary effort is normal. No respiratory distress.  Abdominal:  General: There is no distension.     Palpations: Abdomen is soft.  Musculoskeletal:        General: Normal range of  motion.     Cervical back: Normal range of motion.  Skin:    General: Skin is warm and dry.  Neurological:     Mental Status: She is alert.     UC Treatments / Results  Labs (all labs ordered are listed, but only abnormal results are displayed) Labs Reviewed - No data to display  EKG   Radiology No results found.  Procedures Procedures (including critical care time)  Medications Ordered in UC Medications  ketorolac (TORADOL) injection 60 mg (60 mg Intramuscular Given 12/15/21 1825)    Initial Impression / Assessment and Plan / UC Course  I have reviewed the triage vital signs and the nursing notes.  Pertinent labs & imaging results that were available during my care of the patient were reviewed by me and considered in my medical decision making (see chart for details).     I discussed with the patient that she has chronic sinus issues that should be under the care of her primary care doctor or an ENT.  It is not appropriate to give her antibiotics for every stuffy nose.  She is given prednisone for 5 days for her discomfort. Final Clinical Impressions(s) / UC Diagnoses   Final diagnoses:  Chronic nasal congestion  Environmental allergies  Sinus headache     Discharge Instructions      Continue to drink lots of water  take your antihistamine daily  use your nasal spray daily  It is too soon to take additional antibiotics safely Take the prednisone 2 times a day for 5 days Follow-up with your primary care doctor   ED Prescriptions     Medication Sig Dispense Auth. Provider   predniSONE (DELTASONE) 20 MG tablet Take 1 tablet (20 mg total) by mouth 2 (two) times daily with a meal. 10 tablet Raylene Everts, MD      PDMP not reviewed this encounter.   Raylene Everts, MD 12/15/21 Lurena Nida

## 2021-12-15 NOTE — Discharge Instructions (Signed)
Continue to drink lots of water  take your antihistamine daily  use your nasal spray daily  It is too soon to take additional antibiotics safely Take the prednisone 2 times a day for 5 days Follow-up with your primary care doctor

## 2021-12-15 NOTE — ED Triage Notes (Signed)
Pt c/o sinus congestion, ear fullness and headache x 1 month. Denies fever. Says she was treated last month for sinus infection and felt better but sxs returned after dentist.

## 2022-03-16 ENCOUNTER — Emergency Department (HOSPITAL_BASED_OUTPATIENT_CLINIC_OR_DEPARTMENT_OTHER)
Admission: EM | Admit: 2022-03-16 | Discharge: 2022-03-16 | Disposition: A | Discharge status: Home / Self Care | Payer: Medicare Other | Attending: Emergency Medicine | Admitting: Emergency Medicine

## 2022-03-16 ENCOUNTER — Emergency Department (HOSPITAL_BASED_OUTPATIENT_CLINIC_OR_DEPARTMENT_OTHER): Payer: Medicare Other

## 2022-03-16 ENCOUNTER — Encounter (HOSPITAL_BASED_OUTPATIENT_CLINIC_OR_DEPARTMENT_OTHER): Payer: Self-pay | Admitting: Emergency Medicine

## 2022-03-16 DIAGNOSIS — I10 Essential (primary) hypertension: Secondary | ICD-10-CM | POA: Insufficient documentation

## 2022-03-16 DIAGNOSIS — Z79899 Other long term (current) drug therapy: Secondary | ICD-10-CM | POA: Diagnosis not present

## 2022-03-16 DIAGNOSIS — R059 Cough, unspecified: Secondary | ICD-10-CM | POA: Diagnosis present

## 2022-03-16 DIAGNOSIS — U071 COVID-19: Secondary | ICD-10-CM | POA: Insufficient documentation

## 2022-03-16 LAB — BASIC METABOLIC PANEL
Anion gap: 9 (ref 5–15)
BUN: 12 mg/dL (ref 6–20)
CO2: 30 mmol/L (ref 22–32)
Calcium: 9.3 mg/dL (ref 8.9–10.3)
Chloride: 99 mmol/L (ref 98–111)
Creatinine, Ser: 0.94 mg/dL (ref 0.44–1.00)
GFR, Estimated: >60 mL/min (ref >60)
Glucose, Bld: 104 mg/dL — ABNORMAL HIGH (ref 70–99)
Potassium: 3.8 mmol/L (ref 3.5–5.1)
Sodium: 138 mmol/L (ref 135–145)

## 2022-03-16 LAB — RESP PANEL BY RT-PCR (FLU A&B, COVID) ARPGX2
Influenza A by PCR: NEGATIVE
Influenza B by PCR: NEGATIVE
SARS Coronavirus 2 by RT PCR: POSITIVE — AB

## 2022-03-16 MED ORDER — ALBUTEROL SULFATE HFA 108 (90 BASE) MCG/ACT IN AERS: 2.0000 | INHALATION_SPRAY | Freq: Four times a day (QID) | RESPIRATORY_TRACT | 0 refills | Status: DC | PRN

## 2022-03-16 MED ORDER — DM-GUAIFENESIN ER 30-600 MG PO TB12: 1.0000 | ORAL_TABLET | Freq: Two times a day (BID) | ORAL | 0 refills | Status: DC | PRN

## 2022-03-16 MED ORDER — ONDANSETRON 8 MG PO TBDP: 8.0000 mg | ORAL_TABLET | Freq: Three times a day (TID) | ORAL | 0 refills | Status: DC | PRN

## 2022-03-16 MED ORDER — NIRMATRELVIR/RITONAVIR (PAXLOVID)TABLET: 3.0000 | ORAL_TABLET | Freq: Two times a day (BID) | ORAL | 0 refills | Status: AC

## 2022-03-16 NOTE — ED Notes (Signed)
Pt A&OX4 ambulatory at d/c with independent steady gait. Pt verbalized understanding of d/c instructions, prescriptions and follow up care. 

## 2022-03-16 NOTE — ED Provider Notes (Signed)
?[facility] ?Provider Note ? ? ?CSN: [id number] ?Arrival date & time: 03/16/22  1145 ? ?  ? ?History ? ?Chief Complaint  ?Patient presents with  ? Cough  ? ? ?Janet Richards is a 61 y.o. female. ? ?HPI ? ?  ? ?61 year old female comes in with chief complaint of cough. ?She indicates that she has had a cough for at least 3 days now.  She has reduced appetite and generalized weakness.  Patient has had some family members who she has been around who were diagnosed with COVID-19. ? ?Patient is vaccinated and has received boosters.  She has history of hypertension and is prediabetic. ? ? ?Home Medications ?Prior to Admission medications   ?Medication Sig Start Date End Date Taking? Authorizing Provider  ?albuterol (VENTOLIN HFA) 108 (90 Base) MCG/ACT inhaler Inhale 2 puffs into the lungs every 6 (six) hours as needed for wheezing or shortness of breath. 03/16/22  Yes Varney Biles, MD  ?dextromethorphan-guaiFENesin (MUCINEX DM) 30-600 MG 12hr tablet Take 1 tablet by mouth 2 (two) times daily as needed for cough. 03/16/22  Yes Varney Biles, MD  ?nirmatrelvir/ritonavir EUA (PAXLOVID) 20 x 150 MG & 10 x '100MG'$  TABS Take 3 tablets by mouth 2 (two) times daily for 5 days. Patient GFR is > 60. ?Take nirmatrelvir (150 mg) two tablets twice daily for 5 days and ritonavir (100 mg) one tablet twice daily for 5 days. 03/16/22 03/21/22 Yes Varney Biles, MD  ?ondansetron (ZOFRAN-ODT) 8 MG disintegrating tablet Take 1 tablet (8 mg total) by mouth every 8 (eight) hours as needed for nausea. 03/16/22  Yes Varney Biles, MD  ?calcium-vitamin D 250-100 MG-UNIT tablet Take 1 tablet by mouth 2 (two) times daily.    [provider]  ?cetirizine (ZYRTEC) 10 MG tablet Take 1 tablet (10 mg total) by mouth daily. 06/17/21   Ladell Pier, MD  ?fluconazole (DIFLUCAN) 150 MG tablet Take 1 tablet (150 mg total) by mouth daily. Repeat in 1 week if needed 10/30/21   Raylene Everts, MD   ?hydrochlorothiazide (HYDRODIURIL) 25 MG tablet TAKE ONE TABLET BY MOUTH EVERY DAY 07/16/21   Ladell Pier, MD  ?ibuprofen (ADVIL) 800 MG tablet Take 1 tablet (800 mg total) by mouth 3 (three) times daily with meals. 04/10/21   Vanessa Kick, MD  ?Multiple Vitamins-Minerals (MULTIVITAMIN ADULT PO) Take by mouth.    [provider]  ?predniSONE (DELTASONE) 20 MG tablet Take 1 tablet (20 mg total) by mouth 2 (two) times daily with a meal. 12/15/21   Raylene Everts, MD  ?Turmeric 500 MG TABS Take by mouth.    [provider]  ?vitamin B-12 (CYANOCOBALAMIN) 100 MCG tablet Take 100 mcg by mouth daily.    [provider]  ?   ? ?Allergies    ?Azithromycin and Clindamycin/lincomycin   ? ?Review of Systems   ?Review of Systems  ?All other systems reviewed and are negative. ? ?Physical Exam ?Updated Vital Signs ?BP 130/73 (BP Location: Left Arm)   Pulse (!) 105   Temp 98.6 ?F (37 ?C) (Oral)   Resp 18   Ht '5\' 3"'$  (1.6 m)   Wt 83 kg   LMP 09/24/2017   SpO2 97%   BMI 32.42 kg/m?  ?Physical Exam ?Vitals and nursing note reviewed.  ?Constitutional:   ?   Appearance: She is well-developed.  ?HENT:  ?   Head: Atraumatic.  ?Cardiovascular:  ?   Rate and Rhythm: Normal rate.  ?Pulmonary:  ?  Effort: Pulmonary effort is normal.  ?Musculoskeletal:  ?   Cervical back: Normal range of motion and neck supple.  ?Skin: ?   General: Skin is warm and dry.  ?Neurological:  ?   Mental Status: She is alert and oriented to person, place, and time.  ? ? ?ED Results / Procedures / Treatments   ?Labs ?(all labs ordered are listed, but only abnormal results are displayed) ?Labs Reviewed  ?RESP PANEL BY RT-PCR (FLU A&B, COVID) ARPGX2 - Abnormal; Notable for the following components:  ?    Result Value  ? SARS Coronavirus 2 by RT PCR POSITIVE (*)   ? All other components within normal limits  ?BASIC METABOLIC PANEL - Abnormal; Notable for the following components:  ? Glucose, Bld 104 (*)   ? All other components  within normal limits  ? ? ?EKG ?None ? ?Radiology ?DG Chest Port 1 View ? ?Result Date: 03/16/2022 ?CLINICAL DATA:  COVID, cough. EXAM: PORTABLE CHEST 1 VIEW COMPARISON:  Radiograph July 08, 2019 FINDINGS: The heart size and mediastinal contours are within normal limits. No focal airspace consolidation. No visible pleural effusion or pneumothorax. The visualized skeletal structures are unremarkable. IMPRESSION: No acute cardiopulmonary disease. Electronically Signed   By: Dahlia Bailiff M.D.   On: 03/16/2022 14:28   ? ?Procedures ?Procedures  ? ? ?Medications Ordered in ED ?Medications - No data to display ? ?ED Course/ Medical Decision Making/ A&P ?  ?                        ?Medical Decision Making ?Amount and/or Complexity of Data Reviewed ?Labs: ordered. ?Radiology: ordered. ? ?Risk ?OTC drugs. ?Prescription drug management. ? ? ?61 year old female comes in with chief complaint of cough, malaise, reduced appetite.  Symptoms present for 3 days. ? ?Differential diagnosis includes COVID-19, viral illness, severe electrolyte abnormality.  ? ?COVID-19 test ordered, it is positive.  X-ray visualized independently, no evidence of severe pneumonia or lung consolidation. ? ?Patient is vaccinated and boosted.  She does have BMI over 69, age over 21, hypertension and is prediabetic.  She was offered Paxlovid, patient prefers that we do prescribe it. ? ?Instructions on Paxlovid provided. ? ?Stable for discharge. ? ?Final Clinical Impression(s) / ED Diagnoses ?Final diagnoses:  ?COVID-19  ? ? ?Rx / DC Orders ?ED Discharge Orders   ? ?      Ordered  ?  nirmatrelvir/ritonavir EUA (PAXLOVID) 20 x 150 MG & 10 x '100MG'$  TABS  2 times daily       ? 03/16/22 1459  ?  ondansetron (ZOFRAN-ODT) 8 MG disintegrating tablet  Every 8 hours PRN       ? 03/16/22 1459  ?  dextromethorphan-guaiFENesin (MUCINEX DM) 30-600 MG 12hr tablet  2 times daily PRN       ? 03/16/22 1459  ?  albuterol (VENTOLIN HFA) 108 (90 Base) MCG/ACT inhaler  Every 6  hours PRN       ? 03/16/22 1459  ? ?  ?  ? ?  ? ? ?  ?Varney Biles, MD ?03/16/22 1526 ? ?

## 2022-03-16 NOTE — ED Triage Notes (Signed)
Pt reports cough x a few days and decreased appetite ?

## 2022-03-16 NOTE — Discharge Instructions (Addendum)
You have COVID-19. ? ?Paxlovid has been prescribed at your request.  Please start taking the medication immediately for maximum benefit. ? ?Return to the ER if you start having worsening symptoms, worsening shortness of breath, severe nausea and vomiting, dizziness or near fainting. ?

## 2022-03-17 LAB — LIPID PANEL
Cholesterol: 205 — AB (ref 0–200)
HDL: 48 (ref 35–70)
LDL Cholesterol: 92
Triglycerides: 325 — AB (ref 40–160)

## 2022-03-17 LAB — HIV ANTIBODY (ROUTINE TESTING W REFLEX): HIV 1&2 Ab, 4th Generation: NONREACTIVE

## 2022-03-17 LAB — CBC: RBC: 4.66 (ref 3.87–5.11)

## 2022-03-17 LAB — COMPREHENSIVE METABOLIC PANEL
Albumin: 4.5 (ref 3.5–5.0)
Calcium: 10.7 (ref 8.7–10.7)
Globulin: 2.8

## 2022-03-17 LAB — BASIC METABOLIC PANEL
BUN: 11 (ref 4–21)
CO2: 30 — AB (ref 13–22)
Chloride: 97 — AB (ref 99–108)
Creatinine: 0.8 (ref <=1.1)
Glucose: 109
Potassium: 4 mEq/L (ref 3.5–5.1)
Sodium: 141 (ref 137–147)

## 2022-03-17 LAB — CBC AND DIFFERENTIAL
HCT: 43 (ref 36–46)
Hemoglobin: 13.7 (ref 12.0–16.0)
Platelets: 417 10*3/uL — AB (ref 150–400)
WBC: 7.1

## 2022-03-17 LAB — VITAMIN B12: Vitamin B-12: 682

## 2022-03-17 LAB — VITAMIN D 25 HYDROXY (VIT D DEFICIENCY, FRACTURES): Vit D, 25-Hydroxy: 32.7

## 2022-03-17 LAB — HEPATIC FUNCTION PANEL
ALT: 24 U/L (ref 7–35)
AST: 19 (ref 13–35)
Alkaline Phosphatase: 92 (ref 25–125)
Bilirubin, Total: 0.3

## 2022-03-17 LAB — TSH: TSH: 2.72 (ref <=5.90)

## 2022-03-17 LAB — IRON,TIBC AND FERRITIN PANEL
Ferritin: 61.2
Iron: 74
TIBC: 468
UIBC: 394

## 2022-03-17 LAB — HEPATITIS B SURFACE ANTIGEN: Hepatitis B Surface Ag: NONREACTIVE

## 2022-03-23 ENCOUNTER — Ambulatory Visit: Admitting: Family Medicine

## 2022-03-25 ENCOUNTER — Ambulatory Visit (INDEPENDENT_AMBULATORY_CARE_PROVIDER_SITE_OTHER): Payer: Medicare Other | Admitting: Family Medicine

## 2022-03-25 ENCOUNTER — Encounter: Payer: Self-pay | Admitting: Family Medicine

## 2022-03-25 VITALS — BP 126/75 | HR 89 | Ht 63.0 in | Wt 187.6 lb

## 2022-03-25 DIAGNOSIS — M79671 Pain in right foot: Secondary | ICD-10-CM

## 2022-03-25 DIAGNOSIS — J3089 Other allergic rhinitis: Secondary | ICD-10-CM

## 2022-03-25 DIAGNOSIS — M79672 Pain in left foot: Secondary | ICD-10-CM | POA: Diagnosis not present

## 2022-03-25 MED ORDER — CETIRIZINE HCL 5 MG/5ML PO SOLN: 10.0000 mg | Freq: Every day | ORAL | 1 refills | Status: DC
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
## 2022-03-25 NOTE — Patient Instructions (Signed)
Thank you for choosing [facility] for your Primary Care needs. I am excited for the opportunity to partner with you to meet your health care goals. It was a pleasure meeting you today!   Information on diet, exercise, and health maintenance recommendations are listed below. This is information to help you be sure you are on track for optimal health and monitoring.   Please look over this and let us know if you have any questions or if you have completed any of the health maintenance outside of [place] so that we can be sure your records are up to date.  ___________________________________________________________  MyChart:  For all urgent or time sensitive needs we ask that you please call the office to avoid delays. Our number is [phone]. MyChart is not constantly monitored and due to the large volume of messages a day, replies may take up to 72 business hours.  MyChart Policy: MyChart allows for you to see your visit notes, after visit summary, provider recommendations, lab and tests results, make an appointment, request refills, and contact your provider or the office for non-urgent questions or concerns. Providers are seeing patients during normal business hours and do not have built in time to review MyChart messages.  We ask that you allow a minimum of 3 business days for responses to MyChart messages. For this reason, please do not send urgent requests through MyChart. Please call the office at [phone]. New and ongoing conditions may require a visit. We have virtual and in-person visits available for your convenience.  Complex MyChart concerns may require a visit. Your provider may request you schedule a virtual or in-person visit to ensure we are providing the best care possible. MyChart messages sent after 11:00 AM on Friday will not be received by the provider until Monday morning.    Lab and Test Results: You will receive your lab and  test results on MyChart as soon as they are completed and results have been sent by the lab or testing facility. Due to this service, you will receive your results BEFORE your provider.  I review lab and test results each morning prior to seeing patients. Some results require collaboration with other providers to ensure you are receiving the most appropriate care. For this reason, we ask that you please allow a minimum of 3-5 business days from the time that ALL results have been received for your provider to receive and review lab and test results and contact you about these.  Most lab and test result comments from the provider will be sent through MyChart. Your provider may recommend changes to the plan of care, follow-up visits, repeat testing, ask questions, or request an office visit to discuss these results. You may reply directly to this message or call the office to provide information for the provider or set up an appointment. In some instances, you will be called with test results and recommendations. Please let us know if this is preferred and we will make note of this in your chart to provide this for you.    If you have not heard a response to your lab or test results in 5 business days from all results returning to MyChart, please call the office to let us know. We ask that you please avoid calling prior to this time unless there is an emergent concern. Due to high call volumes, this can delay the resulting process.  After Hours: For all non-emergency after hours needs,   please call the office at [phone] and select the option to reach the on-call  service. On-call services are shared between multiple [facility] offices and therefore it will not be possible to speak directly with your provider. On-call providers may provide medical advice and recommendations, but are unable to provide refills for maintenance medications.  For all emergency or urgent medical needs after normal business  hours, we recommend that you seek care at the closest Urgent Care or Emergency Department to ensure appropriate treatment in a timely manner.  [facility] has a 24 hour emergency room located on the ground floor for your convenience.   Urgent Concerns During the Business Day Providers are seeing patients from 8AM to 5PM with a busy schedule and are most often not able to respond to non-urgent calls until the end of the day or the next business day. If you should have URGENT concerns during the day, please call and speak to the nurse or schedule a same day appointment so that we can address your concern without delay.   Thank you, again, for choosing me as your health care partner. I appreciate your trust and look forward to learning more about you.   Rorey Bisson B. Nida Manfredi, DNP, FNP-C  ___________________________________________________________  Health Maintenance Recommendations Screening Testing Mammogram Every 1-2 years based on history and risk factors Starting at age 50 Pap Smear Ages 21-39 every 3 years Ages 30-65 every 5 years with HPV testing More frequent testing may be required based on results and history Colon Cancer Screening Every 1-10 years based on test performed, risk factors, and history Starting at age 45 Bone Density Screening Every 2-10 years based on history Starting at age 65 for women Recommendations for men differ based on medication usage, history, and risk factors AAA Screening One time ultrasound Men 65-75 years old who have ever smoked Lung Cancer Screening Low Dose Lung CT every 12 months Age 50-80 years with a 20 pack-year smoking history who still smoke or who have quit within the last 15 years  Screening Labs Routine  Labs: Complete Blood Count (CBC), Complete Metabolic Panel (CMP), Cholesterol (Lipid Panel) Every 6-12 months based on history and medications May be recommended more frequently based on current conditions or  previous results Hemoglobin A1c Lab Every 3-12 months based on history and previous results Starting at age 45 or earlier with diagnosis of diabetes, high cholesterol, BMI >26, and/or risk factors Frequent monitoring for patients with diabetes to ensure blood sugar control Thyroid Panel (TSH w/ T3 & T4) Every 6 months based on history, symptoms, and risk factors May be repeated more often if on medication HIV One time testing for all patients 13 and older May be repeated more frequently for patients with increased risk factors or exposure Hepatitis C One time testing for all patients 18 and older May be repeated more frequently for patients with increased risk factors or exposure Gonorrhea, Chlamydia Every 12 months for all sexually active persons 13-24 years Additional monitoring may be recommended for those who are considered high risk or who have symptoms PSA Men 40-54 years old with risk factors Additional screening may be recommended from age 55-69 based on risk factors, symptoms, and history  Vaccine Recommendations Tetanus Booster All adults every 10 years Flu Vaccine All patients 6 months and older every year COVID Vaccine All patients 12 years and older Initial dosing with booster May recommend additional booster based on age and health history HPV Vaccine 2 doses all patients   age 9-26 Dosing may be considered for patients over 26 Shingles Vaccine (Shingrix) 2 doses all adults 50 years and older Pneumonia (Pneumovax 23) All adults 65 years and older May recommend earlier dosing based on health history Pneumonia (Prevnar 13) All adults 65 years and older Dosed 1 year after Pneumovax 23 Pneumonia (Prevnar 20) All adults 65 years and older (adults 19-64 with certain conditions or risk factors) 1 dose  For those who have no received Prevnar 13 vaccine previously   Additional Screening, Testing, and Vaccinations may be recommended on an individualized basis based on  family history, health history, risk factors, and/or exposure.  __________________________________________________________  Diet Recommendations for All Patients  I recommend that all patients maintain a diet low in saturated fats, carbohydrates, and cholesterol. While this can be challenging at first, it is not impossible and small changes can make big differences.  Things to try: Decreasing the amount of soda, sweet tea, and/or juice to one or less per day and replace with water While water is always the first choice, if you do not like water you may consider adding a water additive without sugar to improve the taste other sugar free drinks Replace potatoes with a brightly colored vegetable at dinner Use healthy oils, such as canola oil or olive oil, instead of butter or hard margarine Limit your bread intake to two pieces or less a day Replace regular pasta with low carb pasta options Bake, broil, or grill foods instead of frying Monitor portion sizes  Eat smaller, more frequent meals throughout the day instead of large meals  An important thing to remember is, if you love foods that are not great for your health, you don't have to give them up completely. Instead, allow these foods to be a reward when you have done well. Allowing yourself to still have special treats every once in a while is a nice way to tell yourself thank you for working hard to keep yourself healthy.   Also remember that every day is a new day. If you have a bad day and "fall off the wagon", you can still climb right back up and keep moving along on your journey!  We have resources available to help you!  Some websites that may be helpful include: www.MyPlate.gov  Www.VeryWellFit.com _____________________________________________________________  Activity Recommendations for All Patients  I recommend that all adults get at least 20 minutes of moderate physical activity that elevates your heart rate at least 5  days out of the week.  Some examples include: Walking or jogging at a pace that allows you to carry on a conversation Cycling (stationary bike or outdoors) Water aerobics Yoga Weight lifting Dancing If physical limitations prevent you from putting stress on your joints, exercise in a pool or seated in a chair are excellent options.  Do determine your MAXIMUM heart rate for activity: 220 - YOUR AGE = MAX Heart Rate   Remember! Do not push yourself too hard.  Start slowly and build up your pace, speed, weight, time in exercise, etc.  Allow your body to rest between exercise and get good sleep. You will need more water than normal when you are exerting yourself. Do not wait until you are thirsty to drink. Drink with a purpose of getting in at least 8, 8 ounce glasses of water a day plus more depending on how much you exercise and sweat.    If you begin to develop dizziness, chest pain, abdominal pain, jaw pain, shortness of breath, headache,   vision changes, lightheadedness, or other concerning symptoms, stop the activity and allow your body to rest. If your symptoms are severe, seek emergency evaluation immediately. If your symptoms are concerning, but not severe, please let us know so that we can recommend further evaluation.     

## 2022-03-28 ENCOUNTER — Ambulatory Visit: Admitting: Medical

## 2022-03-28 ENCOUNTER — Emergency Department (INDEPENDENT_AMBULATORY_CARE_PROVIDER_SITE_OTHER)
Admission: EM | Admit: 2022-03-28 | Discharge: 2022-03-28 | Disposition: A | Discharge status: Home / Self Care | Payer: Medicare Other | Source: Home / Self Care | Attending: Family Medicine | Admitting: Family Medicine

## 2022-03-28 DIAGNOSIS — J309 Allergic rhinitis, unspecified: Secondary | ICD-10-CM | POA: Diagnosis not present

## 2022-03-28 DIAGNOSIS — J029 Acute pharyngitis, unspecified: Secondary | ICD-10-CM

## 2022-03-28 MED ORDER — PREDNISONE 20 MG PO TABS: 20.0000 mg | ORAL_TABLET | Freq: Every day | ORAL | 0 refills | Status: DC

## 2022-03-28 NOTE — ED Triage Notes (Signed)
Pt presents with c/o continued cough and sore throat for "a couple weeks"

## 2022-03-28 NOTE — Discharge Instructions (Signed)
Take prednisone 1 a day for 5 days.  May stop early if you feel well Continue with your Zyrtec and your saline nasal spray Drink lots of fluids Follow-up with Caleen Jobs NP

## 2022-03-28 NOTE — ED Provider Notes (Signed)
[name]    CSN: [id number] Arrival date & time: 03/28/22  1245      History   Chief Complaint Chief Complaint  Patient presents with   Sore Throat    HPI Janet Richards is a 61 y.o. female.   HPI  Patient well-known to this provider from visits to the office She saw her primary care doctor, 03/25/2022 and was well Currently complaining of cough sore throat and some runny nose for "a couple of weeks".  Feels like she needs an antibiotic. She has clear runny nose mucus.  No fever or chills.  No headache or body aches. She had COVID earlier in the month, ER visit 03/16/2022 reviewed   Past Medical History:  Diagnosis Date   Allergy    Asthma    Chronic pancreatitis ([place])    Essential hypertension, benign    Menstrual migraine    Periodontitis    Prediabetes 10/17/2014   Primary osteoarthritis of both knees    Sinusitis    Urticaria     Patient Active Problem List   Diagnosis Date Noted   Vaccine counseling 04/09/2020   Palpitations 03/09/2020   Irritation of ear, bilateral 03/09/2020   Generalized headaches 02/18/2020   GERD (gastroesophageal reflux disease) 02/18/2020   Dysuria 09/16/2019   Chronic pancreatitis ([place]) 04/10/2019   Environmental allergies 09/12/2018   Mild intermittent asthma without complication 56/21/3086   Dyspepsia 07/31/2018   Sensory neuronopathy 07/29/2018   Post-menopausal bleeding 05/21/2018   DDD (degenerative disc disease), cervical 02/22/2018   Periodontitis 12/15/2017   Vaginal dryness, menopausal 03/01/2017   Abnormal ankle brachial index (ABI) 11/09/2016   Numbness and tingling of both feet 09/02/2016   Ganglion cyst of right foot 07/12/2016   History of colonic polyps 03/20/2015   Hyperlipidemia 02/20/2015   Other allergic rhinitis 01/14/2015   Prediabetes 10/17/2014   Osteoarthritis of first metatarsophalangeal joint 04/11/2014   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 Authorizing Provider  Nutritional Supplements (IMMUNOCAL PO) Take by mouth.   Yes [provider]  predniSONE (DELTASONE) 20 MG tablet Take 1 tablet (20 mg total) by mouth daily with breakfast. 03/28/22  Yes Raylene Everts, MD  albuterol (VENTOLIN HFA) 108 (90 Base) MCG/ACT inhaler Inhale 2 puffs into the lungs every 6 (six) hours as needed for wheezing or shortness of breath. 03/16/22   Varney Biles, MD  calcium-vitamin D 250-100 MG-UNIT tablet Take 1 tablet by mouth 2 (two) times daily.    [provider]  cetirizine HCl (ZYRTEC) 5 MG/5ML SOLN Take 10 mLs (10 mg total) by mouth daily. 03/25/22   Terrilyn Saver, NP  dextromethorphan-guaiFENesin (MUCINEX DM) 30-600 MG 12hr tablet Take 1 tablet by mouth 2 (two)  times daily as needed for cough. 03/16/22   Varney Biles, MD  hydrochlorothiazide (HYDRODIURIL) 25 MG tablet TAKE ONE TABLET BY MOUTH EVERY DAY 07/16/21   Ladell Pier, MD  Multiple Vitamins-Minerals (MULTIVITAMIN ADULT PO) Take by mouth.    [provider]    Family History Family History  Problem Relation Age of Onset   Arthritis Mother    Hypertension Mother    CAD Father        MI in his 59s   Allergic rhinitis Father    Urticaria Sister    Colon cancer Sister    Allergic rhinitis Son    Asthma Son    Angioedema Neg Hx    Eczema Neg Hx    Immunodeficiency Neg Hx    Esophageal cancer Neg Hx    Stomach cancer Neg Hx    Rectal cancer Neg Hx     Social History Social History   Tobacco  Use   Smoking status: Former    Packs/day: 1.50    Types: Cigarettes    Quit date: 02/05/2006    Years since quitting: 16.1   Smokeless tobacco: Never  Vaping Use   Vaping Use: Never used  Substance Use Topics   Alcohol use: Yes    Comment: rarely   Drug use: No     Allergies   Azithromycin and Clindamycin/lincomycin   Review of Systems Review of Systems See HPI  Physical Exam Triage Vital Signs ED Triage Vitals  Enc Vitals Group     BP 03/28/22 1259 127/71     Pulse Rate 03/28/22 1259 82     Resp 03/28/22 1259 14     Temp 03/28/22 1259 98.3 F (36.8 C)     Temp Source 03/28/22 1259 Oral     SpO2 03/28/22 1259 97 %     Weight --      Height --      Head Circumference --      Peak Flow --      Pain Score 03/28/22 1257 0     Pain Loc --      Pain Edu? --      Excl. in [place]? --    No data found.  Updated Vital Signs BP 127/71 (BP Location: Right Arm)   Pulse 82   Temp 98.3 F (36.8 C) (Oral)   Resp 14   LMP 09/24/2017   SpO2 97%    Physical Exam Constitutional:      General: She is not in acute distress.    Appearance: She is well-developed.     Comments: Overweight.  Pleasant.  Talkative  HENT:     Head: Normocephalic and atraumatic.     Right Ear: Tympanic membrane and ear canal normal.     Left Ear: Tympanic membrane and ear canal normal.     Nose: Congestion and rhinorrhea present.     Comments: Nasal membranes swollen and pale.  Clear rhinorrhea    Mouth/Throat:     Pharynx: No posterior oropharyngeal erythema.  Eyes:     Conjunctiva/sclera: Conjunctivae normal.     Pupils: Pupils are equal, round, and reactive to light.  Cardiovascular:     Rate and Rhythm: Normal rate and regular rhythm.     Heart sounds: Normal heart sounds.  Pulmonary:     Effort: Pulmonary effort is normal. No respiratory distress.     Breath sounds: Normal breath sounds.  Abdominal:     General: There is no distension.  Palpations: Abdomen is soft.   Musculoskeletal:        General: Normal range of motion.     Cervical back: Normal range of motion.  Lymphadenopathy:     Cervical: No cervical adenopathy.  Skin:    General: Skin is warm and dry.  Neurological:     Mental Status: She is alert.     UC Treatments / Results  Labs (all labs ordered are listed, but only abnormal results are displayed) Labs Reviewed - No data to display  EKG   Radiology No results found.  Procedures Procedures (including critical care time)  Medications Ordered in UC Medications - No data to display  Initial Impression / Assessment and Plan / UC Course  I have reviewed the triage vital signs and the nursing notes.  Pertinent labs & imaging results that were available during my care of the patient were reviewed by me and considered in my medical decision making (see chart for details).     Explained to the patient she does not need an antibiotic for 3 days of symptoms.  I do not believe this is a residual of her COVID infection.  She has known chronic allergies with similar rhinitis frequently.  Is advised to follow-up with a new primary care doctor and her ENT Final Clinical Impressions(s) / UC Diagnoses   Final diagnoses:  Chronic allergic rhinitis  Sore throat     Discharge Instructions      Take prednisone 1 a day for 5 days.  May stop early if you feel well Continue with your Zyrtec and your saline nasal spray Drink lots of fluids Follow-up with Caleen Jobs NP   ED Prescriptions     Medication Sig Dispense Auth. Provider   predniSONE (DELTASONE) 20 MG tablet Take 1 tablet (20 mg total) by mouth daily with breakfast. 10 tablet Raylene Everts, MD      PDMP not reviewed this encounter.   Raylene Everts, MD 03/28/22 [phone]

## 2022-04-05 ENCOUNTER — Ambulatory Visit
Admission: EM | Admit: 2022-04-05 | Discharge: 2022-04-05 | Disposition: A | Discharge status: Home / Self Care | Payer: Medicare Other | Attending: Emergency Medicine | Admitting: Emergency Medicine

## 2022-04-05 DIAGNOSIS — U071 COVID-19: Secondary | ICD-10-CM | POA: Diagnosis present

## 2022-04-05 DIAGNOSIS — J302 Other seasonal allergic rhinitis: Secondary | ICD-10-CM | POA: Insufficient documentation

## 2022-04-05 DIAGNOSIS — J029 Acute pharyngitis, unspecified: Secondary | ICD-10-CM | POA: Diagnosis present

## 2022-04-05 DIAGNOSIS — J3089 Other allergic rhinitis: Secondary | ICD-10-CM

## 2022-04-05 DIAGNOSIS — R058 Other specified cough: Secondary | ICD-10-CM | POA: Diagnosis present

## 2022-04-05 LAB — POCT RAPID STREP A (OFFICE): Rapid Strep A Screen: NEGATIVE

## 2022-04-05 MED ORDER — DOXYCYCLINE HYCLATE 100 MG PO CAPS: 100.0000 mg | ORAL_CAPSULE | Freq: Two times a day (BID) | ORAL | 0 refills | Status: AC

## 2022-04-05 NOTE — ED Triage Notes (Signed)
Patient c/o nasal, throat and bilateral ear discomfort. She states she was just seen at an urgent care about a week ago and was prescribed prednisone, she is currently complete with the medication.

## 2022-04-05 NOTE — ED Provider Notes (Signed)
[facility]    CSN: [id number] Arrival date & time: 04/05/22  1756    HISTORY   Chief Complaint  Patient presents with   Otalgia   Sore Throat   HPI Janet Richards is a 61 y.o. female. Patient c/o nasal congestion, sinus pressure, headache, sore throat without difficulty swallowing and bilateral ear discomfort. She states she was seen at an urgent care about a week ago for similar symptoms and was prescribed prednisone, she has finished prescription.  Patient states she continues to have nonproductive cough as well, heaviness in her chest.  Patient states she thinks she needs an antibiotic.  During her visit today, patient is on a Zoom meeting on her phone.  The history is provided by the patient.  Past Medical History:  Diagnosis Date   Allergy    Asthma    Chronic pancreatitis ([place])    Essential hypertension, benign    Menstrual migraine    Periodontitis    Prediabetes 10/17/2014   Primary osteoarthritis of both knees    Sinusitis    Urticaria    Patient Active Problem List   Diagnosis Date Noted   Vaccine counseling 04/09/2020   Palpitations 03/09/2020   Irritation of ear, bilateral 03/09/2020   Generalized headaches 02/18/2020   GERD (gastroesophageal reflux disease) 02/18/2020   Dysuria 09/16/2019   Chronic pancreatitis ([place]) 04/10/2019   Environmental allergies 09/12/2018   Mild intermittent asthma without complication 17/51/0258   Dyspepsia 07/31/2018   Sensory neuronopathy 07/29/2018   Post-menopausal bleeding 05/21/2018   DDD (degenerative disc disease), cervical 02/22/2018   Periodontitis 12/15/2017   Vaginal dryness, menopausal 03/01/2017   Abnormal ankle brachial index (ABI) 11/09/2016   Numbness and tingling of both feet 09/02/2016   Ganglion cyst of right foot 07/12/2016   History of colonic polyps 03/20/2015   Hyperlipidemia 02/20/2015   Other allergic rhinitis 01/14/2015   Prediabetes 10/17/2014   Osteoarthritis of first  metatarsophalangeal joint 04/11/2014   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 Authorizing Provider  albuterol (VENTOLIN HFA) 108 (90 Base) MCG/ACT inhaler Inhale 2 puffs into the lungs every 6 (six) hours as needed for wheezing or shortness of breath. 03/16/22   Varney Biles, MD  cetirizine HCl (ZYRTEC) 5 MG/5ML SOLN Take 10 mLs (10 mg total) by mouth daily. 03/25/22   Terrilyn Saver, NP  dextromethorphan-guaiFENesin (MUCINEX DM) 30-600 MG 12hr tablet Take 1 tablet by mouth 2 (two) times daily as needed for cough. 03/16/22   Varney Biles, MD  hydrochlorothiazide (HYDRODIURIL) 25 MG tablet TAKE ONE TABLET BY MOUTH EVERY DAY 07/16/21   Ladell Pier, MD  Multiple Vitamins-Minerals (MULTIVITAMIN ADULT PO) Take by mouth.    [provider]  Nutritional Supplements (IMMUNOCAL PO) Take by mouth.  [provider]   Family History Family History  Problem Relation Age of Onset   Arthritis Mother    Hypertension Mother    CAD Father        MI in his 67s   Allergic rhinitis Father    Urticaria Sister    Colon cancer Sister    Allergic rhinitis Son    Asthma Son    Angioedema Neg Hx    Eczema Neg Hx    Immunodeficiency Neg Hx    Esophageal cancer Neg Hx    Stomach cancer Neg Hx    Rectal cancer Neg Hx    Social History Social History   Tobacco Use   Smoking status: Former    Packs/day: 1.50    Types: Cigarettes    Quit date: 02/05/2006    Years since quitting: 16.1   Smokeless tobacco: Never   Vaping Use   Vaping Use: Never used  Substance Use Topics   Alcohol use: Yes    Comment: rarely   Drug use: No   Allergies   Azithromycin and Clindamycin/lincomycin  Review of Systems Review of Systems Pertinent findings noted in history of present illness.   Physical Exam Triage Vital Signs ED Triage Vitals  Enc Vitals Group     BP 09/03/21 0827 (!) 147/82     Pulse Rate 09/03/21 0827 72     Resp 09/03/21 0827 18     Temp 09/03/21 0827 98.3 F (36.8 C)     Temp Source 09/03/21 0827 Oral     SpO2 09/03/21 0827 98 %     Weight --      Height --      Head Circumference --      Peak Flow --      Pain Score 09/03/21 0826 5     Pain Loc --      Pain Edu? --      Excl. in [place]? --   No data found.  Updated Vital Signs BP 125/86 (BP Location: Left Arm)   Pulse 93   Temp 99.1 F (37.3 C) (Oral)   Resp 18   LMP 09/24/2017   SpO2 95%   Physical Exam Vitals and nursing note reviewed.  Constitutional:      General: She is not in acute distress.    Appearance: Normal appearance. She is not ill-appearing.  HENT:     Head: Normocephalic and atraumatic.     Salivary Glands: Right salivary gland is not diffusely enlarged or tender. Left salivary gland is not diffusely enlarged or tender.     Right Ear: Tympanic membrane, ear canal and external ear normal. No drainage. No middle ear effusion. There is no impacted cerumen. Tympanic membrane is not erythematous or bulging.     Left Ear: Tympanic membrane, ear canal and external ear normal. No drainage.  No middle ear effusion. There is no impacted cerumen. Tympanic membrane is not erythematous or bulging.     Nose: Nose normal. No nasal deformity, septal deviation, mucosal edema, congestion or rhinorrhea.     Right Turbinates: Not enlarged, swollen or pale.     Left Turbinates: Not enlarged, swollen or pale.     Right Sinus: No maxillary sinus tenderness or frontal sinus tenderness.     Left Sinus: No maxillary sinus tenderness  or frontal sinus tenderness.     Mouth/Throat:     Lips: Pink. No lesions.     Mouth: Mucous membranes are moist. No oral lesions.  Pharynx: Oropharynx is clear. Uvula midline. No posterior oropharyngeal erythema or uvula swelling.     Tonsils: No tonsillar exudate. 0 on the right. 0 on the left.  Eyes:     General: Lids are normal.        Right eye: No discharge.        Left eye: No discharge.     Extraocular Movements: Extraocular movements intact.     Conjunctiva/sclera: Conjunctivae normal.     Right eye: Right conjunctiva is not injected.     Left eye: Left conjunctiva is not injected.  Neck:     Trachea: Trachea and phonation normal.  Cardiovascular:     Rate and Rhythm: Normal rate and regular rhythm.     Pulses: Normal pulses.     Heart sounds: Normal heart sounds. No murmur heard.   No friction rub. No gallop.  Pulmonary:     Effort: Pulmonary effort is normal. No accessory muscle usage, prolonged expiration or respiratory distress.     Breath sounds: Normal breath sounds. No stridor, decreased air movement or transmitted upper airway sounds. No decreased breath sounds, wheezing, rhonchi or rales.  Chest:     Chest wall: No tenderness.  Musculoskeletal:        General: Normal range of motion.     Cervical back: Normal range of motion and neck supple. Normal range of motion.  Lymphadenopathy:     Cervical: No cervical adenopathy.  Skin:    General: Skin is warm and dry.     Findings: No erythema or rash.  Neurological:     General: No focal deficit present.     Mental Status: She is alert and oriented to person, place, and time.  Psychiatric:        Mood and Affect: Mood normal.        Behavior: Behavior normal.    Visual Acuity Right Eye Distance:   Left Eye Distance:   Bilateral Distance:    Right Eye Near:   Left Eye Near:    Bilateral Near:     UC Couse / Diagnostics / Procedures:    EKG  Radiology No results found.  Procedures Procedures  (including critical care time)  UC Diagnoses / Final Clinical Impressions(s)   I have reviewed the triage vital signs and the nursing notes.  Pertinent labs & imaging results that were available during my care of the patient were reviewed by me and considered in my medical decision making (see chart for details).   Final diagnoses:  Sore throat  Perennial allergic rhinitis with seasonal variation  Nonproductive cough  COVID-19   Rapid strep test today is negative, throat culture will be performed per protocol.  I have low suspicion for strep throat.  Given the patient's duration of symptoms and recent exposure to steroids with initial relief of symptoms that again worsened, particularly with herself described intense, persistent sinus pressure, I feel it is reasonable for her to finish 7-day course of doxycycline, prescription sent to pharmacy.  Patient advised to continue all allergy medications.  Return precautions advised.  ED Prescriptions     Medication Sig Dispense Auth. Provider   doxycycline (VIBRAMYCIN) 100 MG capsule Take 1 capsule (100 mg total) by mouth 2 (two) times daily for 7 days. 14 capsule Lynden Oxford Scales, PA-C      PDMP not reviewed this encounter.  Pending results:  Labs Reviewed  CULTURE, GROUP A STREP [facility]  POCT RAPID STREP A (OFFICE)    Medications  Ordered in UC: Medications - No data to display  Disposition Upon Discharge:  Condition: stable for discharge home Home: take medications as prescribed; routine discharge instructions as discussed; follow up as advised.  Patient presented with an acute illness with associated systemic symptoms and significant discomfort requiring urgent management. In my opinion, this is a condition that a prudent lay person (someone who possesses an average knowledge of health and medicine) may potentially expect to result in complications if not addressed urgently such as respiratory distress, impairment of bodily  function or dysfunction of bodily organs.   Routine symptom specific, illness specific and/or disease specific instructions were discussed with the patient and/or caregiver at length.   As such, the patient has been evaluated and assessed, work-up was performed and treatment was provided in alignment with urgent care protocols and evidence based medicine.  Patient/parent/caregiver has been advised that the patient may require follow up for further testing and treatment if the symptoms continue in spite of treatment, as clinically indicated and appropriate.  If the patient was tested for COVID-19, Influenza and/or RSV, then the patient/parent/guardian was advised to isolate at home pending the results of his/her diagnostic coronavirus test and potentially longer if they're positive. I have also advised pt that if his/her COVID-19 test returns positive, it's recommended to self-isolate for at least 10 days after symptoms first appeared AND until fever-free for 24 hours without fever reducer AND other symptoms have improved or resolved. Discussed self-isolation recommendations as well as instructions for household member/close contacts as per the [facility] and [place] [facility], and also gave patient the [place] packet with this information.  Patient/parent/caregiver has been advised to return to the [facility] or PCP in 3-5 days if no better; to PCP or the Emergency Department if new signs and symptoms develop, or if the current signs or symptoms continue to change or worsen for further workup, evaluation and treatment as clinically indicated and appropriate  The patient will follow up with their current PCP if and as advised. If the patient does not currently have a PCP we will assist them in obtaining one.   The patient may need specialty follow up if the symptoms continue, in spite of conservative treatment and management, for further workup, evaluation, consultation and treatment as clinically indicated and  appropriate.  Patient/parent/caregiver verbalized understanding and agreement of plan as discussed.  All questions were addressed during visit.  Please see discharge instructions below for further details of plan.  Discharge Instructions:   Discharge Instructions      Your symptoms and physical exam findings are concerning for a viral respiratory infection that has going on longer than it should have.  It is likely that your uncontrolled allergies have played a part in this issue.   Your strep test today is negative.  Streptococcal throat culture will be performed per our protocol.  The result of your throat culture will be posted to your MyChart once it is complete, this typically takes 3 to 5 days.     Based on my physical exam findings and the history you provided to me today, I recommend that you begin antibiotics now for presumed strep throat instead of waiting for the strep culture result.  I have sent a prescription to your pharmacy.  After 24 hours of antibiotics, you should begin to feel significantly better.  Please discard your toothbrush after 48 hours of antibiotics and replace it with a new one.   Please see the list below for recommended medications, dosages  and frequencies to provide relief of your current symptoms:    Neoma Laming Meissen (doxycycline): Take 1 tablet twice daily for the next 7 days.  Please do not take this medication with dairy products.  Zyrtec (cetirizine): This is an excellent second-generation antihistamine that helps to reduce respiratory inflammatory response to environmental allergens.  In some patients, this medication can cause daytime sleepiness so I recommend that you take 1 tablet daily at bedtime.     Atrovent (ipratropium): This is an excellent nasal decongestant spray that works well with Astepro, please continue to use it as prescribed.  Astepro (azelastine): This is a nasal antihistamine that works well with Atrovent nasal spray, please continue  to use it as prescribed.   ProAir, Ventolin, Proventil (albuterol): This inhaled medication contains a short acting beta agonist bronchodilator.  This medication works on the smooth muscle that opens and constricts of your airways by relaxing the muscle.  The result of relaxation of the smooth muscle is increased air movement and improved work of breathing.  This is a short acting medication that can be used every 4-6 hours as needed for increased work of breathing, shortness of breath, wheezing and excessive coughing.  Please continue to use this as prescribed.   Please follow-up within the next 7-10 days either with your primary care provider or urgent care if your symptoms do not resolve.  If you do not have a primary care provider, we will assist you in finding one.   Thank you for visiting urgent care today.  We appreciate the opportunity to participate in your care.     This office note has been dictated using Museum/gallery curator.  Unfortunately, and despite my best efforts, this method of dictation can sometimes lead to occasional typographical or grammatical errors.  I apologize in advance if this occurs.     Lynden Oxford Scales, [place] 04/05/22 1928

## 2022-04-05 NOTE — Discharge Instructions (Addendum)
Your symptoms and physical exam findings are concerning for a viral respiratory infection that has going on longer than it should have.  It is likely that your uncontrolled allergies have played a part in this issue.   Your strep test today is negative.  Streptococcal throat culture will be performed per our protocol.  The result of your throat culture will be posted to your MyChart once it is complete, this typically takes 3 to 5 days.     Based on my physical exam findings and the history you provided to me today, I recommend that you begin antibiotics now for presumed strep throat instead of waiting for the strep culture result.  I have sent a prescription to your pharmacy.  After 24 hours of antibiotics, you should begin to feel significantly better.  Please discard your toothbrush after 48 hours of antibiotics and replace it with a new one.   Please see the list below for recommended medications, dosages and frequencies to provide relief of your current symptoms:    Neoma Laming Meissen (doxycycline): Take 1 tablet twice daily for the next 7 days.  Please do not take this medication with dairy products.  Zyrtec (cetirizine): This is an excellent second-generation antihistamine that helps to reduce respiratory inflammatory response to environmental allergens.  In some patients, this medication can cause daytime sleepiness so I recommend that you take 1 tablet daily at bedtime.     Atrovent (ipratropium): This is an excellent nasal decongestant spray that works well with Astepro, please continue to use it as prescribed.  Astepro (azelastine): This is a nasal antihistamine that works well with Atrovent nasal spray, please continue to use it as prescribed.   ProAir, Ventolin, Proventil (albuterol): This inhaled medication contains a short acting beta agonist bronchodilator.  This medication works on the smooth muscle that opens and constricts of your airways by relaxing the muscle.  The result of  relaxation of the smooth muscle is increased air movement and improved work of breathing.  This is a short acting medication that can be used every 4-6 hours as needed for increased work of breathing, shortness of breath, wheezing and excessive coughing.  Please continue to use this as prescribed.   Please follow-up within the next 7-10 days either with your primary care provider or urgent care if your symptoms do not resolve.  If you do not have a primary care provider, we will assist you in finding one.   Thank you for visiting urgent care today.  We appreciate the opportunity to participate in your care.

## 2022-04-09 LAB — CULTURE, GROUP A STREP (THRC)

## 2022-04-15 ENCOUNTER — Encounter: Payer: Self-pay | Admitting: *Deleted

## 2022-04-18 ENCOUNTER — Ambulatory Visit (INDEPENDENT_AMBULATORY_CARE_PROVIDER_SITE_OTHER): Payer: Medicare Other | Admitting: Podiatry

## 2022-04-18 ENCOUNTER — Ambulatory Visit (INDEPENDENT_AMBULATORY_CARE_PROVIDER_SITE_OTHER): Payer: Medicare Other

## 2022-04-18 ENCOUNTER — Ambulatory Visit (INDEPENDENT_AMBULATORY_CARE_PROVIDER_SITE_OTHER): Payer: Medicare Other | Admitting: Family Medicine

## 2022-04-18 ENCOUNTER — Encounter: Payer: Self-pay | Admitting: Family Medicine

## 2022-04-18 ENCOUNTER — Ambulatory Visit: Payer: Medicare Other | Admitting: Family Medicine

## 2022-04-18 VITALS — BP 125/85 | HR 89 | Resp 20 | Ht 63.0 in | Wt 188.0 lb

## 2022-04-18 DIAGNOSIS — M79672 Pain in left foot: Secondary | ICD-10-CM

## 2022-04-18 DIAGNOSIS — G629 Polyneuropathy, unspecified: Secondary | ICD-10-CM

## 2022-04-18 DIAGNOSIS — R058 Other specified cough: Secondary | ICD-10-CM

## 2022-04-18 DIAGNOSIS — M7989 Other specified soft tissue disorders: Secondary | ICD-10-CM

## 2022-04-18 DIAGNOSIS — M79671 Pain in right foot: Secondary | ICD-10-CM | POA: Diagnosis not present

## 2022-04-18 LAB — PULMONARY FUNCTION TEST

## 2022-04-18 MED ORDER — CHLORPHENIRAMINE MALEATE 4 MG PO TABS: 4.0000 mg | ORAL_TABLET | Freq: Two times a day (BID) | ORAL | 0 refills | Status: DC | PRN

## 2022-04-18 MED ORDER — BENZONATATE 200 MG PO CAPS: 200.0000 mg | ORAL_CAPSULE | Freq: Two times a day (BID) | ORAL | 0 refills | Status: DC | PRN

## 2022-04-18 MED ORDER — MELOXICAM 7.5 MG PO TABS: 7.5000 mg | ORAL_TABLET | Freq: Every day | ORAL | 0 refills | Status: DC

## 2022-04-18 NOTE — Patient Instructions (Signed)
Post-viral cough: - Adding chlorpheniramine (antihistamine) for bedtime to help reduce drainage - Adding Tessalon to help suppress the cough to allow throat irritation to heal, can continue Delsym - Recommend throat lozenges, frequent sipping on fluids, voice rest, etc.  - Avoid allergens/triggers - recommend follow-up with your allergist - If this persists, we can refer to ENT to further investigate since you have had recurrent symptoms

## 2022-04-18 NOTE — Progress Notes (Signed)
Acute Office Visit  Subjective:     Patient ID: Janet Richards, female    DOB: 01/25/1961, 61 y.o.   MRN: [id number]  Chief Complaint  Patient presents with   Sinus Problem   Cough    HPI Patient is in today for cough, sore throat, post nasal drainage.   Patient reports that ever since having COVID about a month ago she has struggled with cough and throat irritations. She has been to urgent care twice within the past few weeks and has received prednisone and doxycycline. States that she also saw the dentist because the sinus pressure was causing some dental pain, but they did not find any dental sources for her symptoms. Patient reports that she did get some relief with the steroids and antibiotics and most of the pressure has resolved, but she has continued with dry cough, post nasal drainage, and throat irritation. She reports history of allergic rhinitis, but has not seen her allergist in over a year. She takes takes daily zyrtec and ipratropium nasal spray. She denies any other URI symptoms, fevers, sputum production, rashes, GI/GU problems, chest pain, dyspnea.       ROS All review of systems negative except what is listed in the HPI      Objective:    BP 125/85 (BP Location: Left Arm, Patient Position: Sitting, Cuff Size: Large)   Pulse 89   Resp 20   Ht '5\' 3"'$  (1.6 m)   Wt 188 lb (85.3 kg)   LMP 09/11/2017 (Approximate)   SpO2 97%   BMI 33.30 kg/m    Physical Exam Vitals reviewed.  Constitutional:      Appearance: Normal appearance.  HENT:     Head: Normocephalic and atraumatic.     Right Ear: Tympanic membrane normal.     Left Ear: Tympanic membrane normal.     Nose: Nose normal.     Mouth/Throat:     Mouth: Mucous membranes are moist.     Pharynx: Oropharynx is clear. No oropharyngeal exudate or posterior oropharyngeal erythema.  Cardiovascular:     Rate and Rhythm: Normal rate and regular rhythm.  Pulmonary:     Effort: Pulmonary effort is normal.      Breath sounds: Normal breath sounds. No wheezing, rhonchi or rales.  Musculoskeletal:     Cervical back: Normal range of motion and neck supple. No tenderness.  Lymphadenopathy:     Cervical: No cervical adenopathy.  Skin:    General: Skin is warm and dry.  Neurological:     General: No focal deficit present.     Mental Status: She is alert and oriented to person, place, and time.  Psychiatric:        Mood and Affect: Mood normal.        Behavior: Behavior normal.        Thought Content: Thought content normal.        Judgment: Judgment normal.        Results for orders placed or performed in visit on 04/18/22  Pulmonary Function Test  Result Value Ref Range   FEV1     FVC     FEV1/FVC     TLC     DLCO          Assessment & Plan:   1. Post-viral cough syndrome - Adding chlorpheniramine (antihistamine) for bedtime to help reduce drainage - Adding Tessalon to help suppress the cough to allow throat irritation to heal, can continue Delsym - Recommend throat  lozenges, frequent sipping on fluids, voice rest, etc.  - Avoid allergens/triggers - recommend follow-up with your allergist - If this persists, we can refer to ENT to further investigate since you have had recurrent symptoms    - benzonatate (TESSALON) 200 MG capsule; Take 1 capsule (200 mg total) by mouth 2 (two) times daily as needed for cough.  Dispense: 20 capsule; Refill: 0 - chlorpheniramine (CHLOR-TRIMETON) 4 MG tablet; Take 1 tablet (4 mg total) by mouth 2 (two) times daily as needed for allergies.  Dispense: 14 tablet; Refill: 0   Return if symptoms worsen or fail to improve.  Terrilyn Saver, NP

## 2022-04-24 ENCOUNTER — Other Ambulatory Visit: Payer: Self-pay

## 2022-04-24 ENCOUNTER — Emergency Department (HOSPITAL_BASED_OUTPATIENT_CLINIC_OR_DEPARTMENT_OTHER): Payer: Medicare Other

## 2022-04-24 ENCOUNTER — Encounter (HOSPITAL_BASED_OUTPATIENT_CLINIC_OR_DEPARTMENT_OTHER): Payer: Self-pay | Admitting: Emergency Medicine

## 2022-04-24 ENCOUNTER — Emergency Department (HOSPITAL_BASED_OUTPATIENT_CLINIC_OR_DEPARTMENT_OTHER)
Admission: EM | Admit: 2022-04-24 | Discharge: 2022-04-24 | Disposition: A | Discharge status: Home / Self Care | Payer: Medicare Other | Attending: Emergency Medicine | Admitting: Emergency Medicine

## 2022-04-24 DIAGNOSIS — I10 Essential (primary) hypertension: Secondary | ICD-10-CM | POA: Diagnosis not present

## 2022-04-24 DIAGNOSIS — R0789 Other chest pain: Secondary | ICD-10-CM

## 2022-04-24 DIAGNOSIS — R0602 Shortness of breath: Secondary | ICD-10-CM | POA: Insufficient documentation

## 2022-04-24 DIAGNOSIS — R079 Chest pain, unspecified: Secondary | ICD-10-CM | POA: Diagnosis present

## 2022-04-24 DIAGNOSIS — Z87891 Personal history of nicotine dependence: Secondary | ICD-10-CM | POA: Insufficient documentation

## 2022-04-24 LAB — BASIC METABOLIC PANEL
Anion gap: 8 (ref 5–15)
BUN: 12 mg/dL (ref 6–20)
CO2: 31 mmol/L (ref 22–32)
Calcium: 9.8 mg/dL (ref 8.9–10.3)
Chloride: 97 mmol/L — ABNORMAL LOW (ref 98–111)
Creatinine, Ser: 0.85 mg/dL (ref 0.44–1.00)
GFR, Estimated: >60 mL/min (ref >60)
Glucose, Bld: 121 mg/dL — ABNORMAL HIGH (ref 70–99)
Potassium: 3.2 mmol/L — ABNORMAL LOW (ref 3.5–5.1)
Sodium: 136 mmol/L (ref 135–145)

## 2022-04-24 LAB — CBC WITH DIFFERENTIAL/PLATELET
Abs Immature Granulocytes: 0.01 10*3/uL (ref 0.00–0.07)
Basophils Absolute: 0 10*3/uL (ref 0.0–0.1)
Basophils Relative: 0 %
Eosinophils Absolute: 0.2 10*3/uL (ref 0.0–0.5)
Eosinophils Relative: 3 %
HCT: 39.1 % (ref 36.0–46.0)
Hemoglobin: 13.2 g/dL (ref 12.0–15.0)
Immature Granulocytes: 0 %
Lymphocytes Relative: 41 %
Lymphs Abs: 2.6 10*3/uL (ref 0.7–4.0)
MCH: 30.3 pg (ref 26.0–34.0)
MCHC: 33.8 g/dL (ref 30.0–36.0)
MCV: 89.7 fL (ref 80.0–100.0)
Monocytes Absolute: 0.4 10*3/uL (ref 0.1–1.0)
Monocytes Relative: 6 %
Neutro Abs: 3.2 10*3/uL (ref 1.7–7.7)
Neutrophils Relative %: 50 %
Platelets: 398 10*3/uL (ref 150–400)
RBC: 4.36 MIL/uL (ref 3.87–5.11)
RDW: 14.4 % (ref 11.5–15.5)
WBC: 6.5 10*3/uL (ref 4.0–10.5)
nRBC: 0 % (ref 0.0–0.2)

## 2022-04-24 LAB — HEPATIC FUNCTION PANEL
ALT: 23 U/L (ref 0–44)
AST: 22 U/L (ref 15–41)
Albumin: 4.3 g/dL (ref 3.5–5.0)
Alkaline Phosphatase: 62 U/L (ref 38–126)
Bilirubin, Direct: <0.1 mg/dL (ref 0.0–0.2)
Total Bilirubin: 0.7 mg/dL (ref 0.3–1.2)
Total Protein: 7.4 g/dL (ref 6.5–8.1)

## 2022-04-24 LAB — LIPASE, BLOOD: Lipase: 31 U/L (ref 11–51)

## 2022-04-24 LAB — TROPONIN I (HIGH SENSITIVITY): Troponin I (High Sensitivity): 5 ng/L (ref <18)

## 2022-04-24 LAB — D-DIMER, QUANTITATIVE: D-Dimer, Quant: <0.27 ug/mL-FEU (ref 0.00–0.50)

## 2022-04-24 MED ORDER — PREDNISONE 10 MG PO TABS: 20.0000 mg | ORAL_TABLET | Freq: Every day | ORAL | 0 refills | Status: AC

## 2022-04-24 MED ORDER — PREDNISONE 20 MG PO TABS
40.0000 mg | ORAL_TABLET | Freq: Once | ORAL | Status: AC
  Administered 2022-04-24: 40 mg via ORAL
  Filled 2022-04-24: qty 2

## 2022-04-24 NOTE — ED Triage Notes (Signed)
Pt arrives pov, steady gait, c/o mid sternal CP and shob today. Dx with covid x 4 weeks pta

## 2022-04-24 NOTE — ED Notes (Signed)
Patient given discharge instructions. Questions were answered. Patient verbalized understanding of discharge instructions and care at home.  

## 2022-04-24 NOTE — ED Provider Notes (Signed)
[facility] Provider Note   CSN: [id number] Arrival date & time: 04/24/22  1820     History  Chief Complaint  Patient presents with   Chest Pain    Janet Richards is a 61 y.o. female.  Patient here with chest pain, shortness of breath.  Somewhat has been present over the last for 5 weeks.  Ever since she was diagnosed with COVID she has had some of this discomfort.  She is not sure if she is having a new viral process as symptoms are slightly worse today.  She has been having some right-sided chest pain during this time post COVID.  She denies any sputum production or fever or chills.  Still has a sore throat and some congestion.  Nothing makes it worse or better.  Former smoker.  History of hypertension, chronic pancreatitis.  Denies any abdominal pain, weakness or numbness.  The history is provided by the patient.       Home Medications Prior to Admission medications   Medication Sig Start Date End Date Taking? Authorizing Provider  predniSONE (DELTASONE) 10 MG tablet Take 2 tablets (20 mg total) by mouth daily for 5 days. 04/24/22 04/29/22 Yes Xiara Knisley, DO  albuterol (VENTOLIN HFA) 108 (90 Base) MCG/ACT inhaler Inhale 2 puffs into the lungs every 6 (six) hours as needed for wheezing or shortness of breath. 03/16/22   Varney Biles, MD  benzonatate (TESSALON) 200 MG capsule Take 1 capsule (200 mg total) by mouth 2 (two) times daily as needed for cough. 04/18/22   Terrilyn Saver, NP  cetirizine HCl (ZYRTEC) 5 MG/5ML SOLN Take 10 mLs (10 mg total) by mouth daily. 03/25/22   Terrilyn Saver, NP  chlorpheniramine (CHLOR-TRIMETON) 4 MG tablet Take 1 tablet (4 mg total) by mouth 2 (two) times daily as needed for allergies. 04/18/22   Terrilyn Saver, NP  dextromethorphan-guaiFENesin (MUCINEX DM) 30-600 MG 12hr tablet Take 1 tablet by mouth 2 (two) times daily as needed for cough. 03/16/22   Varney Biles, MD  hydrochlorothiazide (HYDRODIURIL) 25 MG  tablet TAKE ONE TABLET BY MOUTH EVERY DAY 07/16/21   Ladell Pier, MD  meloxicam (MOBIC) 7.5 MG tablet Take 1 tablet (7.5 mg total) by mouth daily. 04/18/22   Trula Slade, DPM  Multiple Vitamins-Minerals (MULTIVITAMIN ADULT PO) Take by mouth.    [provider]  Nutritional Supplements (IMMUNOCAL PO) Take by mouth.    [provider]      Allergies    Azithromycin and Clindamycin/lincomycin    Review of Systems   Review of Systems  Physical Exam Updated Vital Signs BP 132/78   Pulse 86   Temp 98.4 F (36.9 C) (Oral)   Resp 18   Ht '5\' 3"'$  (1.6 m)   Wt 83.9 kg   LMP 09/11/2017 (Approximate)   SpO2 99%   BMI 32.77 kg/m  Physical Exam Vitals and nursing note reviewed.  Constitutional:      General: She is not in acute distress.    Appearance: She is well-developed. She is not ill-appearing.  HENT:     Head: Normocephalic and atraumatic.  Eyes:     Conjunctiva/sclera: Conjunctivae normal.  Cardiovascular:     Rate and Rhythm: Normal rate and regular rhythm.     Pulses:          Radial pulses are 2+ on the right side and 2+ on the left side.     Heart sounds: No murmur heard.  Pulmonary:     Effort: Pulmonary effort is normal. No respiratory distress.     Breath sounds: Normal breath sounds.  Chest:     Chest wall: No tenderness.  Abdominal:     Palpations: Abdomen is soft.     Tenderness: There is no abdominal tenderness.  Musculoskeletal:        General: No swelling.     Cervical back: Neck supple.     Right lower leg: No edema.     Left lower leg: No edema.  Skin:    General: Skin is warm and dry.     Capillary Refill: Capillary refill takes less than 2 seconds.  Neurological:     Mental Status: She is alert.  Psychiatric:        Mood and Affect: Mood normal.     ED Results / Procedures / Treatments   Labs (all labs ordered are listed, but only abnormal results are displayed) Labs Reviewed  BASIC METABOLIC PANEL - Abnormal;  Notable for the following components:      Result Value   Potassium 3.2 (*)    Chloride 97 (*)    Glucose, Bld 121 (*)    All other components within normal limits  CBC WITH DIFFERENTIAL/PLATELET  D-DIMER, QUANTITATIVE  HEPATIC FUNCTION PANEL  LIPASE, BLOOD  TROPONIN I (HIGH SENSITIVITY)    EKG EKG Interpretation  Date/Time:  Sunday April 24 2022 18:35:27 EDT Ventricular Rate:  92 PR Interval:  140 QRS Duration: 96 QT Interval:  378 QTC Calculation: 467 R Axis:   80 Text Interpretation: Normal sinus rhythm Normal ECG When compared with ECG of 03-Feb-2021 14:48, PREVIOUS ECG IS PRESENT Confirmed by Lennice Sites ([id number]) on 04/24/2022 6:38:58 PM  Radiology DG Chest Portable 1 View  Result Date: 04/24/2022 CLINICAL DATA:  Chest pain. EXAM: PORTABLE CHEST 1 VIEW COMPARISON:  Chest radiograph dated 03/16/2022. FINDINGS: The heart size and mediastinal contours are within normal limits. Both lungs are clear. The visualized skeletal structures are unremarkable. IMPRESSION: No active disease. Electronically Signed   By: Anner Crete M.D.   On: 04/24/2022 19:15    Procedures Procedures    Medications Ordered in ED Medications  predniSONE (DELTASONE) tablet 40 mg (has no administration in time range)    ED Course/ Medical Decision Making/ A&P                           Medical Decision Making Amount and/or Complexity of Data Reviewed Labs: ordered. Radiology: ordered.  Risk Prescription drug management.   Janet Richards is here with chest pain and shortness of breath.  Normal vitals.  No fever.  Symptoms ongoing for the last 4 to 5 weeks ever since she had COVID.  History of chronic pancreatitis, hypertension.  Former smoker.  Differential diagnosis is pneumonia versus new viral process versus ACS versus PE.  Could be long COVID type symptoms.  Will check CBC, CMP, lipase, troponin, D-dimer, chest x-ray.  EKG per my review interpretation shows sinus rhythm.  No ischemic  changes.  Overall atypical story for ACS.  However we will get blood work and troponin and x-rays.  Per my review and interpretation of images and labs there is no significant anemia, electrolyte abnormality, leukocytosis.  Troponin normal.  D-dimer normal.  Chest x-ray without evidence of pneumonia or pneumothorax.  Overall suspect either long COVID type symptoms or new viral process.  Will prescribe prednisone.  Discharged in good condition.  Understands  return precautions.  This chart was dictated using voice recognition software.  Despite best efforts to proofread,  errors can occur which can change the documentation meaning.         Final Clinical Impression(s) / ED Diagnoses Final diagnoses:  Atypical chest pain    Rx / DC Orders ED Discharge Orders          Ordered    predniSONE (DELTASONE) 10 MG tablet  Daily        04/24/22 2039              Lennice Sites, DO 04/24/22 2039

## 2022-04-25 NOTE — Progress Notes (Signed)
Subjective:   Patient ID: Janet Richards, female   DOB: 61 y.o.   MRN: [id number]   HPI 61 year old female presents the office today for evaluation of neuropathy, dry skin.  Is been ongoing since 2018 she has numbness, burning, sharp sensations.  She does not swelling her toes at nighttime on the right.  She has been on gabapentin but she did not have to stop this because of side effects.    Review of Systems  All other systems reviewed and are negative.  Past Medical History:  Diagnosis Date   Allergy    Asthma    Chronic pancreatitis ([place])    Essential hypertension, benign    Menstrual migraine    Periodontitis    Prediabetes 10/17/2014   Primary osteoarthritis of both knees    Sinusitis    Urticaria     Past Surgical History:  Procedure Laterality Date   TUBAL LIGATION  12/26/1987     Current Outpatient Medications:    meloxicam (MOBIC) 7.5 MG tablet, Take 1 tablet (7.5 mg total) by mouth daily., Disp: 30 tablet, Rfl: 0   albuterol (VENTOLIN HFA) 108 (90 Base) MCG/ACT inhaler, Inhale 2 puffs into the lungs every 6 (six) hours as needed for wheezing or shortness of breath., Disp: 8 g, Rfl: 0   benzonatate (TESSALON) 200 MG capsule, Take 1 capsule (200 mg total) by mouth 2 (two) times daily as needed for cough., Disp: 20 capsule, Rfl: 0   cetirizine HCl (ZYRTEC) 5 MG/5ML SOLN, Take 10 mLs (10 mg total) by mouth daily., Disp: 473 mL, Rfl: 1   chlorpheniramine (CHLOR-TRIMETON) 4 MG tablet, Take 1 tablet (4 mg total) by mouth 2 (two) times daily as needed for allergies., Disp: 14 tablet, Rfl: 0   dextromethorphan-guaiFENesin (MUCINEX DM) 30-600 MG 12hr tablet, Take 1 tablet by mouth 2 (two) times daily as needed for cough., Disp: 10 tablet, Rfl: 0   hydrochlorothiazide (HYDRODIURIL) 25 MG tablet, TAKE ONE TABLET BY MOUTH EVERY DAY, Disp: 90 tablet, Rfl: 0   Multiple Vitamins-Minerals (MULTIVITAMIN ADULT PO), Take by mouth., Disp: , Rfl:    Nutritional Supplements (IMMUNOCAL  PO), Take by mouth., Disp: , Rfl:    predniSONE (DELTASONE) 10 MG tablet, Take 2 tablets (20 mg total) by mouth daily for 5 days., Disp: 10 tablet, Rfl: 0  Allergies  Allergen Reactions   Azithromycin Nausea And Vomiting   Clindamycin/Lincomycin Hives    Facial swelling          Objective:  Physical Exam  General: AAO x3, NAD  Dermatological: Skin is warm, dry and supple bilateral. There are no open sores, no preulcerative lesions, no rash or signs of infection present.  Vascular: Dorsalis Pedis artery and Posterior Tibial artery pedal pulses are 2/4 bilateral with immedate capillary fill time.  There is no pain with calf compression, swelling, warmth, erythema.   Neruologic: Grossly intact via light touch bilateral.   Musculoskeletal: No area pinpoint tenderness noted.  Flexor, extensor tendons appear to be intact.  Muscular strength 5/5 in all groups tested bilateral.  Gait: Unassisted, Nonantalgic.       Assessment:   Likely neuropathy     Plan:  -Treatment options discussed including all alternatives, risks, and complications -Etiology of symptoms were discussed -X-rays were obtained and reviewed with the patient.  3 views of the right foot were obtained.  No evidence of acute fracture or stress fracture identified. -We discussed different medication options.  She wants to try A more natural.  Discussed B complex vitamins as well as alpha lipoic acid.  Discussed Voltaren gel topically.  Also turmeric for inflammation.  She wants to consider other treatments and possibly consider neurogenix through PT although obvious alternative treatments may only provide temporary relief at best.   Trula Slade DPM

## 2022-05-06 ENCOUNTER — Other Ambulatory Visit: Payer: Self-pay | Admitting: Podiatry

## 2022-05-06 DIAGNOSIS — G629 Polyneuropathy, unspecified: Secondary | ICD-10-CM

## 2022-05-14 ENCOUNTER — Emergency Department (HOSPITAL_BASED_OUTPATIENT_CLINIC_OR_DEPARTMENT_OTHER)
Admission: EM | Admit: 2022-05-14 | Discharge: 2022-05-14 | Disposition: A | Discharge status: Home / Self Care | Payer: Medicare Other | Attending: Emergency Medicine | Admitting: Emergency Medicine

## 2022-05-14 ENCOUNTER — Encounter (HOSPITAL_BASED_OUTPATIENT_CLINIC_OR_DEPARTMENT_OTHER): Payer: Self-pay | Admitting: Emergency Medicine

## 2022-05-14 ENCOUNTER — Other Ambulatory Visit: Payer: Self-pay

## 2022-05-14 DIAGNOSIS — J45909 Unspecified asthma, uncomplicated: Secondary | ICD-10-CM | POA: Diagnosis not present

## 2022-05-14 DIAGNOSIS — X58XXXA Exposure to other specified factors, initial encounter: Secondary | ICD-10-CM | POA: Diagnosis not present

## 2022-05-14 DIAGNOSIS — Z87891 Personal history of nicotine dependence: Secondary | ICD-10-CM | POA: Insufficient documentation

## 2022-05-14 DIAGNOSIS — S6991XA Unspecified injury of right wrist, hand and finger(s), initial encounter: Secondary | ICD-10-CM | POA: Insufficient documentation

## 2022-05-14 DIAGNOSIS — D1801 Hemangioma of skin and subcutaneous tissue: Secondary | ICD-10-CM | POA: Diagnosis not present

## 2022-05-14 DIAGNOSIS — Z7951 Long term (current) use of inhaled steroids: Secondary | ICD-10-CM | POA: Diagnosis not present

## 2022-05-14 NOTE — ED Provider Notes (Signed)
[facility] Provider Note: Georgena Spurling, MD, FACEP  CSN: [id number] MRN: [id number] ARRIVAL: 05/14/22 at 2143 ROOM: [place]  Finger Problem   HISTORY OF PRESENT ILLNESS  05/14/22 11:15 PM Janet Richards is a 61 y.o. female who crushed her right middle finger in April of this year.  There was a subsequent subungual hematoma.  This since then the old nail has become detached from the nail fold and a new nail is growing underneath.  She is concerned that it is healing properly.  She is still having soreness in that fingertip which she rates as a 6 out of 10 and the fingertip is somewhat swollen compared to other fingertips.  It is not erythematous or warm.  There has been no drainage.  She also noticed a small red spot on her left shoulder that she had never seen before.  It is not painful.   Past Medical History:  Diagnosis Date   Allergy    Asthma    Chronic pancreatitis ([place])    Essential hypertension, benign    Menstrual migraine    Periodontitis    Prediabetes 10/17/2014   Primary osteoarthritis of both knees    Sinusitis    Urticaria     Past Surgical History:  Procedure Laterality Date   TUBAL LIGATION  12/26/1987    Family History  Problem Relation Age of Onset   Arthritis Mother    Hypertension Mother    CAD Father        MI in his 71s   Allergic rhinitis Father    Urticaria Sister    Colon cancer Sister    Allergic rhinitis Son    Asthma Son    Angioedema Neg Hx    Eczema Neg Hx    Immunodeficiency Neg Hx    Esophageal cancer Neg Hx    Stomach cancer Neg Hx    Rectal cancer Neg Hx     Social History   Tobacco Use   Smoking status: Former    Packs/day: 1.50    Types: Cigarettes    Quit date: 02/05/2006    Years since quitting: 16.2   Smokeless tobacco: Never  Vaping Use   Vaping Use: Never used  Substance Use Topics   Alcohol use: Yes    Comment: rarely   Drug use: No    Prior to Admission medications    Medication Sig Start Date End Date Taking? Authorizing Provider  albuterol (VENTOLIN HFA) 108 (90 Base) MCG/ACT inhaler Inhale 2 puffs into the lungs every 6 (six) hours as needed for wheezing or shortness of breath. 03/16/22   Varney Biles, MD  benzonatate (TESSALON) 200 MG capsule Take 1 capsule (200 mg total) by mouth 2 (two) times daily as needed for cough. 04/18/22   Terrilyn Saver, NP  cetirizine HCl (ZYRTEC) 5 MG/5ML SOLN Take 10 mLs (10 mg total) by mouth daily. 03/25/22   Terrilyn Saver, NP  chlorpheniramine (CHLOR-TRIMETON) 4 MG tablet Take 1 tablet (4 mg total) by mouth 2 (two) times daily as needed for allergies. 04/18/22   Terrilyn Saver, NP  dextromethorphan-guaiFENesin (MUCINEX DM) 30-600 MG 12hr tablet Take 1 tablet by mouth 2 (two) times daily as needed for cough. 03/16/22   Varney Biles, MD  hydrochlorothiazide (HYDRODIURIL) 25 MG tablet TAKE ONE TABLET BY MOUTH EVERY DAY 07/16/21   Ladell Pier, MD  meloxicam (MOBIC) 7.5 MG tablet Take 1 tablet (7.5 mg total) by mouth daily.  04/18/22   Trula Slade, DPM  Multiple Vitamins-Minerals (MULTIVITAMIN ADULT PO) Take by mouth.    [provider]  Nutritional Supplements (IMMUNOCAL PO) Take by mouth.    [provider]    Allergies Azithromycin and Clindamycin/lincomycin   REVIEW OF SYSTEMS  Negative except as noted here or in the History of Present Illness.   PHYSICAL EXAMINATION  Initial Vital Signs Blood pressure (!) 160/105, pulse 82, temperature 98.8 F (37.1 C), temperature source Oral, resp. rate 18, height '5\' 3"'$  (1.6 m), weight 85.7 kg, last menstrual period 09/11/2017, SpO2 100 %.  Examination General: Well-developed, well-nourished female in no acute distress; appearance consistent with age of record HENT: normocephalic; atraumatic Eyes: Normal appearance Neck: supple Heart: regular rate and rhythm Lungs: clear to auscultation bilaterally Abdomen: soft; nondistended; nontender; bowel  sounds present Extremities: No deformity; full range of motion Neurologic: Awake, alert and oriented; motor function intact in all extremities and symmetric; no facial droop Skin: Warm and dry; small cherry angioma left shoulder; soon-to-be deciduous nail right middle finger adherent to new nail:    Psychiatric: Normal mood and affect   RESULTS  Summary of this visit's results, reviewed and interpreted by myself:   EKG Interpretation  Date/Time:    Ventricular Rate:    PR Interval:    QRS Duration:   QT Interval:    QTC Calculation:   R Axis:     Text Interpretation:         Laboratory Studies: No results found for this or any previous visit (from the past 24 hour(s)). Imaging Studies: No results found.  ED COURSE and MDM  Nursing notes, initial and subsequent vitals signs, including pulse oximetry, reviewed and interpreted by myself.  Vitals:   05/14/22 2155 05/14/22 2157 05/14/22 2243 05/14/22 2249  BP:  (!) 165/95  (!) 160/105  Pulse:  85 77 82  Resp:  18  18  Temp:  98.8 F (37.1 C)    TempSrc:  Oral    SpO2:  100% 99% 100%  Weight: 85.7 kg     Height: '5\' 3"'$  (1.6 m)      Medications - No data to display  Patient reassured that her nail is healing appropriately.  The old nail should fall off without difficulty and the new nail should eventually grow and and appear normal or close to normal.  She was also reassured that the cherry angioma is a benign, usually asymptomatic skin finding.  PROCEDURES  Procedures   ED DIAGNOSES     ICD-10-CM   1. Fingernail injury, right, initial encounter  S69.91XA     2. Cherry angioma  D18.01          Shanon Rosser, MD 05/14/22 [phone]

## 2022-05-14 NOTE — ED Notes (Signed)
Patient verbalizes understanding of discharge instructions. Opportunity for questioning and answers were provided. Armband removed by staff, pt discharged from ED. Ambulated out to lobby  

## 2022-05-14 NOTE — ED Triage Notes (Signed)
Reports she smashed her right middle finger months ago.  Nail noted to be detached from nail bed.  Also reports a red spot on left upper arm.  Reports pain in that whole arm since yesterday.

## 2022-06-07 ENCOUNTER — Encounter (HOSPITAL_BASED_OUTPATIENT_CLINIC_OR_DEPARTMENT_OTHER): Payer: Self-pay | Admitting: Emergency Medicine

## 2022-06-07 ENCOUNTER — Ambulatory Visit: Admitting: Internal Medicine

## 2022-06-13 ENCOUNTER — Ambulatory Visit: Payer: Medicare Other | Admitting: Obstetrics and Gynecology

## 2022-06-28 ENCOUNTER — Other Ambulatory Visit: Payer: Self-pay | Admitting: Internal Medicine

## 2022-06-29 ENCOUNTER — Encounter: Payer: Self-pay | Admitting: Physician Assistant

## 2022-06-29 ENCOUNTER — Ambulatory Visit: Payer: Medicare Other | Attending: Internal Medicine | Admitting: Physician Assistant

## 2022-06-29 VITALS — BP 152/81 | HR 90 | Ht 63.0 in | Wt 189.8 lb

## 2022-06-29 DIAGNOSIS — J3089 Other allergic rhinitis: Secondary | ICD-10-CM | POA: Diagnosis not present

## 2022-06-29 DIAGNOSIS — Z79890 Hormone replacement therapy (postmenopausal): Secondary | ICD-10-CM

## 2022-06-29 DIAGNOSIS — K0889 Other specified disorders of teeth and supporting structures: Secondary | ICD-10-CM

## 2022-06-29 DIAGNOSIS — R7303 Prediabetes: Secondary | ICD-10-CM

## 2022-06-29 DIAGNOSIS — L505 Cholinergic urticaria: Secondary | ICD-10-CM

## 2022-06-29 DIAGNOSIS — I10 Essential (primary) hypertension: Secondary | ICD-10-CM

## 2022-06-29 MED ORDER — PENICILLIN V POTASSIUM 500 MG PO TABS: 500.0000 mg | ORAL_TABLET | Freq: Three times a day (TID) | ORAL | 0 refills | Status: AC

## 2022-06-29 MED ORDER — CETIRIZINE HCL 10 MG PO TABS: 10.0000 mg | ORAL_TABLET | Freq: Every day | ORAL | 1 refills | Status: DC

## 2022-06-29 NOTE — Progress Notes (Signed)
Patient ID: Janet Richards, female   DOB: [birth year], 61 y.o.   MRN: [id number]     Janet Richards, is a 61 y.o. female  VFI:[id number]  [id number]  DOB - 1960-12-29  Chief Complaint  Patient presents with   exposure to mold       Subjective:   Janet Richards is a 61 y.o. female here today for urticaria and pain in mouth.  She was diagnosed with cholinergic urticaria about 9-10 years ago.  Uses zyrtec sometimes for it and rarely has to take prednisone.    C/o upper tooth and gum pain.  She has a partial and will be seeing dentist soon.   She is taking immunocal for her BP but hasn't taken it in about 3 weeks.  When she was taking it, her BP was ~130/80.  Has not taken HCTZ in "a while."    No problems updated.  ALLERGIES: Allergies  Allergen Reactions   Azithromycin Nausea And Vomiting   Clindamycin/Lincomycin Hives    Facial swelling    PAST MEDICAL HISTORY: Past Medical History:  Diagnosis Date   Allergy    Asthma    Chronic pancreatitis ([place])    Essential hypertension, benign    Menstrual migraine    Periodontitis    Prediabetes 10/17/2014   Primary osteoarthritis of both knees    Sinusitis    Urticaria     MEDICATIONS AT HOME: Prior to Admission medications   Medication Sig Start Date End Date Taking? Authorizing Provider  albuterol (VENTOLIN HFA) 108 (90 Base) MCG/ACT inhaler Inhale 2 puffs into the lungs every 6 (six) hours as needed for wheezing or shortness of breath. 03/16/22  Yes Varney Biles, MD  cetirizine (ZYRTEC ALLERGY) 10 MG tablet Take 1 tablet (10 mg total) by mouth daily. 06/29/22  Yes Sable Knoles, Dionne Bucy, PA-C  meloxicam (MOBIC) 7.5 MG tablet Take 1 tablet (7.5 mg total) by mouth daily. 04/18/22  Yes Trula Slade, DPM  Multiple Vitamins-Minerals (MULTIVITAMIN ADULT PO) Take by mouth.   Yes [provider]  Nutritional Supplements (IMMUNOCAL PO) Take by mouth.   Yes [provider]  penicillin v potassium (VEETID)  500 MG tablet Take 1 tablet (500 mg total) by mouth 3 (three) times daily for 10 days. 06/29/22 07/09/22 Yes Zorawar Strollo, Dionne Bucy, PA-C  benzonatate (TESSALON) 200 MG capsule Take 1 capsule (200 mg total) by mouth 2 (two) times daily as needed for cough. Patient not taking: Reported on 06/29/2022 04/18/22   Caleen Jobs B, NP  chlorpheniramine (CHLOR-TRIMETON) 4 MG tablet Take 1 tablet (4 mg total) by mouth 2 (two) times daily as needed for allergies. Patient not taking: Reported on 06/29/2022 04/18/22   Terrilyn Saver, NP  dextromethorphan-guaiFENesin [facility] DM) 30-600 MG 12hr tablet Take 1 tablet by mouth 2 (two) times daily as needed for cough. Patient not taking: Reported on 06/29/2022 03/16/22   Varney Biles, MD  hydrochlorothiazide (HYDRODIURIL) 25 MG tablet TAKE ONE TABLET BY MOUTH EVERY DAY Patient not taking: Reported on 06/29/2022 07/16/21   Ladell Pier, MD    ROS: Neg resp Neg cardiac Neg GI Neg GU Neg MS Neg psych Neg neuro  Objective:   Vitals:   06/29/22 1536  BP: (!) 152/81  Pulse: 90  SpO2: 97%  Weight: 189 lb 12.8 oz (86.1 kg)  Height: '5\' 3"'$  (1.6 m)   Exam General appearance : Awake, alert, not in any distress. Speech Clear. Not toxic looking HEENT: Atraumatic and Normocephalic-with upper partial removed,  there is some erythema and swelling of the upper L gums posteriorly.   Neck: Supple, no JVD. No cervical lymphadenopathy.  Chest: Good air entry bilaterally, CTAB.  No rales/rhonchi/wheezing CVS: S1 S2 regular, no murmurs.  Extremities: B/L Lower Ext shows no edema, both legs are warm to touch Neurology: Awake alert, and oriented X 3, CN II-XII intact, Non focal Skin: No Rash.  No hives today Psych: pressured and tangential speech.  Needed redirecting throughout visit.    Data Review Lab Results  Component Value Date   HGBA1C 6.2 08/01/2019   HGBA1C 6.3 (H) 12/09/2015   HGBA1C 5.9 10/17/2014    Assessment & Plan   1. Other allergic rhinitis -  cetirizine (ZYRTEC ALLERGY) 10 MG tablet; Take 1 tablet (10 mg total) by mouth daily.  Dispense: 90 tablet; Refill: 1  2. Essential hypertension She will use immunocal for now per her decision and will check BP daily and will schedule another appt if BP consistently >130/85  3. Cholinergic urticaria - cetirizine (ZYRTEC ALLERGY) 10 MG tablet; Take 1 tablet (10 mg total) by mouth daily.  Dispense: 90 tablet; Refill: 1  4. Pain, dental - penicillin v potassium (VEETID) 500 MG tablet; Take 1 tablet (500 mg total) by mouth 3 (three) times daily for 10 days.  Dispense: 30 tablet; Refill: 0  5. Hormone replacement therapy (HRT) Needs referral - Ambulatory referral to Gynecology  6. Prediabetes I have had a lengthy discussion and provided education about insulin resistance and the intake of too much sugar/refined carbohydrates.  I have advised the patient to work at a goal of eliminating sugary drinks, candy, desserts, sweets, refined sugars, processed foods, and white carbohydrates.  The patient expresses understanding.  - Hemoglobin A1c    Return in about 6 months (around 12/30/2022) for PCP for chronic conditions.  The patient was given clear instructions to go to ER or return to medical center if symptoms don't improve, worsen or new problems develop. The patient verbalized understanding. The patient was told to call to get lab results if they haven't heard anything in the next week.      Freeman Caldron, PA-C [facility] [place], [place]   06/29/2022, 3:51 PM

## 2022-06-29 NOTE — Patient Instructions (Signed)
Check blood pressures daily and if >130/85 on average, please return to clinic

## 2022-06-30 LAB — HEMOGLOBIN A1C
Est. average glucose Bld gHb Est-mCnc: 126 mg/dL
Hgb A1c MFr Bld: 6 % — ABNORMAL HIGH (ref 4.8–5.6)

## 2022-07-15 ENCOUNTER — Encounter: Payer: Medicare Other | Admitting: Obstetrics and Gynecology

## 2022-07-19 ENCOUNTER — Encounter: Payer: Self-pay | Admitting: General Practice

## 2022-07-20 ENCOUNTER — Ambulatory Visit: Attending: Physician Assistant | Admitting: Physician Assistant

## 2022-07-20 ENCOUNTER — Encounter: Payer: Self-pay | Admitting: Physician Assistant

## 2022-07-20 ENCOUNTER — Ambulatory Visit: Payer: Self-pay | Admitting: *Deleted

## 2022-07-20 VITALS — BP 148/82 | HR 87 | Wt 188.4 lb

## 2022-07-20 DIAGNOSIS — Z1231 Encounter for screening mammogram for malignant neoplasm of breast: Secondary | ICD-10-CM

## 2022-07-20 DIAGNOSIS — R232 Flushing: Secondary | ICD-10-CM | POA: Diagnosis not present

## 2022-07-20 NOTE — Telephone Encounter (Signed)
  Chief Complaint: bilateral breast pain, sweating a lot, "a pain in my heart from the mold and mildew in my apartment".   City has been out.   My pantry is full of mold and mildew and it's affecting my health. Symptoms: bilateral breast pain, see above. Frequency: Since moving into apartment in Feb. And being exposed to the mold and mildew Pertinent Negatives: Patient denies having cardiac issues.   "It's from the exposure to the mold and mildew" Disposition: '[]'$ ED /'[]'$ Urgent Care (no appt availability in office) / '[x]'$ Appointment(In office/virtual)/ '[]'$  [facility]/ '[]'$ Home Care/ '[]'$ Refused Recommended Disposition /'[]'$ [facility]/ '[]'$  Follow-up with PCP Additional Notes: Appt made for today with Freeman Caldron, PA at 10:10.

## 2022-07-20 NOTE — Progress Notes (Signed)
Patient ID: Janet Richards, female   DOB: 1960-12-28, 61 y.o.   MRN: [id number]   Janet Richards, is a 61 y.o. female  [id number]  [id number]  DOB - 01-28-1961  No chief complaint on file.      Subjective:   Janet Richards is a 61 y.o. female here today for having concerns with mold and mildew in her apt.  This has been ongoing and longstanding and she has seen an allergist.   She has not had a mammogram in over a year.  She sometimes has sharp and stinging pains in her breasts.  She has not noticed a lump or a mass.  She continues to have hot flashes.  I had referred her to gyn at her last visit but she never heard from the office.  Likely bc she had changed her number.      No problems updated.  ALLERGIES: Allergies  Allergen Reactions   Azithromycin Nausea And Vomiting   Clindamycin/Lincomycin Hives    Facial swelling    PAST MEDICAL HISTORY: Past Medical History:  Diagnosis Date   Allergy    Asthma    Chronic pancreatitis ([place])    Essential hypertension, benign    Menstrual migraine    Periodontitis    Prediabetes 10/17/2014   Primary osteoarthritis of both knees    Sinusitis    Urticaria     MEDICATIONS AT HOME: Prior to Admission medications   Medication Sig Start Date End Date Taking? Authorizing Provider  albuterol (VENTOLIN HFA) 108 (90 Base) MCG/ACT inhaler Inhale 2 puffs into the lungs every 6 (six) hours as needed for wheezing or shortness of breath. 03/16/22   Varney Biles, MD  benzonatate (TESSALON) 200 MG capsule Take 1 capsule (200 mg total) by mouth 2 (two) times daily as needed for cough. Patient not taking: Reported on 06/29/2022 04/18/22   Terrilyn Saver, NP  cetirizine (ZYRTEC ALLERGY) 10 MG tablet Take 1 tablet (10 mg total) by mouth daily. 06/29/22   Argentina Donovan, PA-C  chlorpheniramine (CHLOR-TRIMETON) 4 MG tablet Take 1 tablet (4 mg total) by mouth 2 (two) times daily as needed for allergies. Patient not taking:  Reported on 06/29/2022 04/18/22   Terrilyn Saver, NP  dextromethorphan-guaiFENesin [facility] DM) 30-600 MG 12hr tablet Take 1 tablet by mouth 2 (two) times daily as needed for cough. Patient not taking: Reported on 06/29/2022 03/16/22   Varney Biles, MD  hydrochlorothiazide (HYDRODIURIL) 25 MG tablet TAKE ONE TABLET BY MOUTH EVERY DAY Patient not taking: Reported on 06/29/2022 07/16/21   Ladell Pier, MD  meloxicam (MOBIC) 7.5 MG tablet Take 1 tablet (7.5 mg total) by mouth daily. 04/18/22   Trula Slade, DPM  Multiple Vitamins-Minerals (MULTIVITAMIN ADULT PO) Take by mouth.    [provider]  Nutritional Supplements (IMMUNOCAL PO) Take by mouth.    [provider]    ROS: Neg resp Neg cardiac Neg GI Neg GU Neg MS Neg psych Neg neuro  Objective:   Vitals:   07/20/22 1044  BP: (!) 148/82  Weight: 188 lb 6.4 oz (85.5 kg)   Exam General appearance : Awake, alert, not in any distress. Speech pressured. Not toxic looking HEENT: Atraumatic and Normocephalic Neck: Supple, no JVD. No cervical lymphadenopathy.  Chest: Good air entry bilaterally, CTAB.  No rales/rhonchi/wheezing CVS: S1 S2 regular, no murmurs.  Extremities: B/L Lower Ext shows no edema, both legs are warm to touch Neurology: Awake alert, and oriented X 3, CN II-XII  intact, Non focal Skin: No Rash  Data Review Lab Results  Component Value Date   HGBA1C 6.0 (H) 06/29/2022   HGBA1C 6.2 08/01/2019   HGBA1C 6.3 (H) 12/09/2015    Assessment & Plan   1. Hot flashes - FSH/LH - Ambulatory referral to Gynecology  2. Screening mammogram for breast cancer - MM Digital Screening; Future    Return in about 3 months (around 10/19/2022) for Dr Wynetta Emery for chronic conditions.  The patient was given clear instructions to go to ER or return to medical center if symptoms don't improve, worsen or new problems develop. The patient verbalized understanding. The patient was told to call to get lab  results if they haven't heard anything in the next week.   1/2 of the 25 min visit was related to mold/mildew/looking at [facility] in her phone etc.     Freeman Caldron, PA-C [facility] [place] [place]   07/20/2022, 10:57 AM

## 2022-07-20 NOTE — Telephone Encounter (Signed)
I'm allergic to mold and mildew and it's in my apartment.   I'm due a PAP smear too. I want to be checked.   This mold is the problem.   I'm having the same things as I had before.   I'm in menopause.     Reason for Disposition  [1] Breast pain AND [2] cause is not known  Answer Assessment - Initial Assessment Questions 1. SYMPTOM: "What's the main symptom you're concerned about?"  (e.g., lump, pain, rash, nipple discharge)     Both breasts have shooting pains.   Since I've been in the apartment since Feb.   The city came out and I have mold and mildew in my apartment. 2. LOCATION: "Where is the Breasts located?"     Both are sore and have pains.    I'm in full fledged menopause.    I'm having a pain in my heart.  It's from the mildew in my apartment.   My pantry is full of mold from a water heater leak.   It's fixed now.  I called the city and they came out.    My roof is also leaking.     3. ONSET: "When did pain  start?"     I've had the breast for over a year.   I went to the women's clinic and they said everything is fine. My heart is hurting from the mold and mildew. 4. PRIOR HISTORY: "Do you have any history of prior problems with your breasts?" (e.g., lumps, cancer, fibrocystic breast disease)     No 5. CAUSE: "What do you think is causing this symptom?"     Pain is from the mold and mildew.   I'm allergic to mold and mildew.   6. OTHER SYMPTOMS: "Do you have any other symptoms?" (e.g., fever, breast pain, redness or rash, nipple discharge)     No 7. PREGNANCY-BREASTFEEDING: "Is there any chance you are pregnant?" "When was your last menstrual period?" "Are you breastfeeding?"     No  Protocols used: Breast Symptoms-A-AH

## 2022-07-21 ENCOUNTER — Telehealth: Payer: Self-pay

## 2022-07-21 LAB — FSH/LH
FSH: 65.3 m[IU]/mL
LH: 39 m[IU]/mL

## 2022-07-21 NOTE — Telephone Encounter (Signed)
Pt. Given lab results, verbalizes understanding. 

## 2022-07-25 ENCOUNTER — Encounter: Payer: Self-pay | Admitting: Emergency Medicine

## 2022-07-25 ENCOUNTER — Ambulatory Visit (INDEPENDENT_AMBULATORY_CARE_PROVIDER_SITE_OTHER): Payer: Medicare Other

## 2022-07-25 ENCOUNTER — Other Ambulatory Visit: Payer: Self-pay

## 2022-07-25 ENCOUNTER — Ambulatory Visit
Admission: EM | Admit: 2022-07-25 | Discharge: 2022-07-25 | Disposition: A | Discharge status: Home / Self Care | Payer: Medicare Other | Attending: Emergency Medicine | Admitting: Emergency Medicine

## 2022-07-25 ENCOUNTER — Ambulatory Visit: Payer: Medicare Other

## 2022-07-25 DIAGNOSIS — M25512 Pain in left shoulder: Secondary | ICD-10-CM

## 2022-07-25 DIAGNOSIS — M19012 Primary osteoarthritis, left shoulder: Secondary | ICD-10-CM | POA: Diagnosis not present

## 2022-07-25 NOTE — ED Triage Notes (Signed)
Left shoulder pain for 2 weeks.  Has seen pcp, but no diagnosis.  Patient thinks it is related to mold in home.  Denies injury.  Patient does remember pushing shoulders together with teenage child , but otherwise not known  Also has a gum issue

## 2022-07-25 NOTE — Discharge Instructions (Addendum)
The x-ray of your left shoulder reveals that you have arthritis in your left shoulder.    Because this is a chronic condition, please follow-up with your regular provider regarding management of your pain.  You are welcome to continue taking meloxicam as you have previously been prescribed.  You are also welcome to try Tylenol 1000 mg 3 times daily, ibuprofen 400 mg 3 times daily or Aleve 1 tablet twice daily.  Please follow-up with your dental provider regarding your issue with your gums.

## 2022-07-25 NOTE — ED Provider Notes (Signed)
[facility]    CSN: [id number] Arrival date & time: 07/25/22  1542    HISTORY   Chief Complaint  Patient presents with   Shoulder Pain   Dental Pain   HPI Janet Richards is a pleasant, 61 y.o. female who presents to urgent care today. Patient complains of pain in her left shoulder for the past 2 weeks.  Patient states she is already seen her PCP about this but has not received a diagnosis.  Patient states she thinks it might be related to the mold in her apartment.  Patient states she denies known injury but she does remember bumping shoulders with her son.    The history is provided by the patient.   Past Medical History:  Diagnosis Date   Allergy    Asthma    Chronic pancreatitis ([place])    Essential hypertension, benign    Menstrual migraine    Periodontitis    Prediabetes 10/17/2014   Primary osteoarthritis of both knees    Sinusitis    Urticaria    Patient Active Problem List   Diagnosis Date Noted   Vaccine counseling 04/09/2020   Palpitations 03/09/2020   Irritation of ear, bilateral 03/09/2020   Generalized headaches 02/18/2020   GERD (gastroesophageal reflux disease) 02/18/2020   Dysuria 09/16/2019   Chronic pancreatitis ([place]) 04/10/2019   Environmental allergies 09/12/2018   Mild intermittent asthma without complication 40/98/1191   Dyspepsia 07/31/2018   Sensory neuronopathy 07/29/2018   Post-menopausal bleeding 05/21/2018   DDD (degenerative disc disease), cervical 02/22/2018   Periodontitis 12/15/2017   Vaginal dryness, menopausal 03/01/2017   Abnormal ankle brachial index (ABI) 11/09/2016   Numbness and tingling of both feet 09/02/2016   Ganglion cyst of right foot 07/12/2016   History of colonic polyps 03/20/2015   Hyperlipidemia 02/20/2015   Other allergic rhinitis 01/14/2015   Prediabetes 10/17/2014   Osteoarthritis of first metatarsophalangeal joint 04/11/2014   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 Authorizing Provider  albuterol (VENTOLIN HFA) 108 (90 Base) MCG/ACT inhaler Inhale 2 puffs into the lungs every 6 (six) hours as needed for wheezing or shortness of breath. 03/16/22   Varney Biles, MD  benzonatate (TESSALON) 200 MG capsule Take 1 capsule (200 mg total) by mouth 2 (two) times daily as needed for cough. Patient not taking: Reported on 06/29/2022 04/18/22   Terrilyn Saver, NP  cetirizine (ZYRTEC ALLERGY) 10 MG tablet Take 1 tablet (10 mg total) by mouth daily. 06/29/22   Argentina Donovan, PA-C  chlorpheniramine (CHLOR-TRIMETON) 4 MG tablet Take 1 tablet (4 mg total) by mouth 2 (two) times daily as needed for allergies. Patient not taking: Reported on 06/29/2022 04/18/22   Terrilyn Saver, NP  dextromethorphan-guaiFENesin [facility] DM) 30-600 MG 12hr tablet  Take 1 tablet by mouth 2 (two) times daily as needed for cough. Patient not taking: Reported on 06/29/2022 03/16/22   Varney Biles, MD  hydrochlorothiazide (HYDRODIURIL) 25 MG tablet TAKE ONE TABLET BY MOUTH EVERY DAY Patient not taking: Reported on 06/29/2022 07/16/21   Ladell Pier, MD  meloxicam (MOBIC) 7.5 MG tablet Take 1 tablet (7.5 mg total) by mouth daily. 04/18/22   Trula Slade, DPM  Multiple Vitamins-Minerals (MULTIVITAMIN ADULT PO) Take by mouth.    [provider]  Nutritional Supplements (IMMUNOCAL PO) Take by mouth.    [provider]    Family History Family History  Problem Relation Age of Onset   Arthritis Mother    Hypertension Mother     CAD Father        MI in his 79s   Allergic rhinitis Father    Urticaria Sister    Colon cancer Sister    Allergic rhinitis Son    Asthma Son    Angioedema Neg Hx    Eczema Neg Hx    Immunodeficiency Neg Hx    Esophageal cancer Neg Hx    Stomach cancer Neg Hx    Rectal cancer Neg Hx    Social History Social History   Tobacco Use   Smoking status: Former    Packs/day: 1.50    Types: Cigarettes    Quit date: 02/05/2006    Years since quitting: 16.4   Smokeless tobacco: Never  Vaping Use   Vaping Use: Never used  Substance Use Topics   Alcohol use: Yes    Comment: rarely   Drug use: No   Allergies   Azithromycin and Clindamycin/lincomycin  Review of Systems Review of Systems Pertinent findings revealed after performing a 14 point review of systems has been noted in the history of present illness.  Physical Exam Triage Vital Signs ED Triage Vitals  Enc Vitals Group     BP 09/03/21 0827 (!) 147/82     Pulse Rate 09/03/21 0827 72     Resp 09/03/21 0827 18     Temp 09/03/21 0827 98.3 F (36.8 C)     Temp Source 09/03/21 0827 Oral     SpO2 09/03/21 0827 98 %     Weight --      Height --      Head Circumference --      Peak Flow --      Pain Score 09/03/21 0826 5     Pain Loc --      Pain Edu? --      Excl. in [place]? --   No data found.  Updated Vital Signs BP (!) 148/89 (BP Location: Right Arm)   Pulse 80   Temp 98.8 F (37.1 C) (Oral)   Resp 16   LMP 09/11/2017 (Approximate)   SpO2 96%   Physical Exam Vitals and nursing note reviewed.  Constitutional:      General: She is not in acute distress.    Appearance: Normal appearance. She is obese. She is not ill-appearing or diaphoretic.  HENT:     Head: Normocephalic and atraumatic.  Eyes:     Pupils: Pupils are equal, round, and reactive to light.  Cardiovascular:     Rate and Rhythm: Normal rate and regular rhythm.  Pulmonary:     Effort: Pulmonary effort is normal.     Breath sounds: Normal breath  sounds.  Musculoskeletal:        General: Normal range of motion.  Right shoulder: Normal.     Left shoulder: Normal. No swelling, deformity, effusion, laceration, tenderness, bony tenderness or crepitus. Normal range of motion. Normal strength. Normal pulse.     Cervical back: Normal range of motion and neck supple.  Skin:    General: Skin is warm and dry.  Neurological:     General: No focal deficit present.     Mental Status: She is alert and oriented to person, place, and time. Mental status is at baseline.  Psychiatric:        Mood and Affect: Mood normal.        Behavior: Behavior normal.        Thought Content: Thought content normal.        Judgment: Judgment normal.     Visual Acuity Right Eye Distance:   Left Eye Distance:   Bilateral Distance:    Right Eye Near:   Left Eye Near:    Bilateral Near:     UC Couse / Diagnostics / Procedures:     Radiology DG Shoulder Left  Result Date: 07/25/2022 CLINICAL DATA:  chronic pain EXAM: LEFT SHOULDER - 2+ VIEW COMPARISON:  None Available. FINDINGS: No evidence of acute fracture or dislocation. Glenohumeral and acromioclavicular degenerative change including joint space loss and marginal osteophytes. Soft tissues are unremarkable. IMPRESSION: 1. No evidence of acute fracture or dislocation. 2. Glenohumeral and acromioclavicular degenerative change. Electronically Signed   By: Margaretha Sheffield M.D.   On: 07/25/2022 18:17    Procedures Procedures (including critical care time) EKG  Pending results:  Labs Reviewed - No data to display  Medications Ordered in UC: Medications - No data to display  UC Diagnoses / Final Clinical Impressions(s)   I have reviewed the triage vital signs and the nursing notes.  Pertinent labs & imaging results that were available during my care of the patient were reviewed by me and considered in my medical decision making (see chart for details).    Final diagnoses:  Arthritis of left  shoulder region   Patient advised of x-ray findings.  Patient advised to follow-up with primary care provider regarding his chronic condition.  Patient encouraged to try OTC pain medications as needed and to remain active.  Return precautions advised.  ED Prescriptions   None    PDMP not reviewed this encounter.  Pending results:  Labs Reviewed - No data to display  Discharge Instructions:   Discharge Instructions      The x-ray of your left shoulder reveals that you have arthritis in your left shoulder.    Because this is a chronic condition, please follow-up with your regular provider regarding management of your pain.  You are welcome to continue taking meloxicam as you have previously been prescribed.  You are also welcome to try Tylenol 1000 mg 3 times daily, ibuprofen 400 mg 3 times daily or Aleve 1 tablet twice daily.  Please follow-up with your dental provider regarding your issue with your gums.      Disposition Upon Discharge:  Condition: stable for discharge home  Patient presented with an acute illness with associated systemic symptoms and significant discomfort requiring urgent management. In my opinion, this is a condition that a prudent lay person (someone who possesses an average knowledge of health and medicine) may potentially expect to result in complications if not addressed urgently such as respiratory distress, impairment of bodily function or dysfunction of bodily organs.   Routine symptom specific, illness specific and/or disease specific instructions were discussed  with the patient and/or caregiver at length.   As such, the patient has been evaluated and assessed, work-up was performed and treatment was provided in alignment with urgent care protocols and evidence based medicine.  Patient/parent/caregiver has been advised that the patient may require follow up for further testing and treatment if the symptoms continue in spite of treatment, as clinically  indicated and appropriate.  Patient/parent/caregiver has been advised to return to the [facility] or PCP if no better; to PCP or the Emergency Department if new signs and symptoms develop, or if the current signs or symptoms continue to change or worsen for further workup, evaluation and treatment as clinically indicated and appropriate  The patient will follow up with their current PCP if and as advised. If the patient does not currently have a PCP we will assist them in obtaining one.   The patient may need specialty follow up if the symptoms continue, in spite of conservative treatment and management, for further workup, evaluation, consultation and treatment as clinically indicated and appropriate.   Patient/parent/caregiver verbalized understanding and agreement of plan as discussed.  All questions were addressed during visit.  Please see discharge instructions below for further details of plan.  This office note has been dictated using Museum/gallery curator.  Unfortunately, this method of dictation can sometimes lead to typographical or grammatical errors.  I apologize for your inconvenience in advance if this occurs.  Please do not hesitate to reach out to me if clarification is needed.      Lynden Oxford Scales, PA-C 07/27/22 1224

## 2022-07-28 ENCOUNTER — Telehealth: Payer: Self-pay | Admitting: Emergency Medicine

## 2022-07-28 NOTE — Telephone Encounter (Signed)
Copied from [place] [id number]. Topic: Referral - Status >> Jul 28, 2022 11:01 AM Cyndi Bender wrote: Reason for CRM: Pt requests call back regarding getting a referral for a specific type of mammogram. Pt asked that Dr. Durenda Age nurse return her call. Cb# [phone]

## 2022-08-10 NOTE — Telephone Encounter (Signed)
RTC spoke w/ pt. She states that she has appt for mammogram scheduled 10/31. Pt states that she was able to receive appt for mammo and that she also had GYN appt on 10/12. Informed pt info will be place in note, Pt expressed understanding. Pt states no further questions or concerns at this time. -----DD,RMA

## 2022-08-18 ENCOUNTER — Ambulatory Visit (INDEPENDENT_AMBULATORY_CARE_PROVIDER_SITE_OTHER): Payer: Medicare Other | Admitting: Obstetrics and Gynecology

## 2022-08-18 ENCOUNTER — Encounter: Payer: Self-pay | Admitting: Obstetrics and Gynecology

## 2022-08-18 VITALS — BP 136/78 | HR 68 | Ht 62.0 in | Wt 190.0 lb

## 2022-08-18 DIAGNOSIS — M545 Low back pain, unspecified: Secondary | ICD-10-CM | POA: Insufficient documentation

## 2022-08-18 DIAGNOSIS — E663 Overweight: Secondary | ICD-10-CM

## 2022-08-18 DIAGNOSIS — L259 Unspecified contact dermatitis, unspecified cause: Secondary | ICD-10-CM | POA: Insufficient documentation

## 2022-08-18 DIAGNOSIS — L270 Generalized skin eruption due to drugs and medicaments taken internally: Secondary | ICD-10-CM | POA: Insufficient documentation

## 2022-08-18 DIAGNOSIS — L309 Dermatitis, unspecified: Secondary | ICD-10-CM | POA: Insufficient documentation

## 2022-08-18 DIAGNOSIS — N951 Menopausal and female climacteric states: Secondary | ICD-10-CM | POA: Diagnosis not present

## 2022-08-18 DIAGNOSIS — J45909 Unspecified asthma, uncomplicated: Secondary | ICD-10-CM | POA: Insufficient documentation

## 2022-08-18 DIAGNOSIS — M7710 Lateral epicondylitis, unspecified elbow: Secondary | ICD-10-CM

## 2022-08-18 HISTORY — DX: Overweight: E66.3

## 2022-08-18 HISTORY — DX: Unspecified contact dermatitis, unspecified cause: L25.9

## 2022-08-18 HISTORY — DX: Lateral epicondylitis, unspecified elbow: M77.10

## 2022-08-18 HISTORY — DX: Dermatitis, unspecified: L30.9

## 2022-08-18 HISTORY — DX: Generalized skin eruption due to drugs and medicaments taken internally: L27.0

## 2022-08-18 HISTORY — DX: Low back pain, unspecified: M54.50

## 2022-08-18 MED ORDER — GABAPENTIN 100 MG PO CAPS: ORAL_CAPSULE | ORAL | 1 refills | Status: DC

## 2022-08-18 NOTE — Patient Instructions (Signed)
You can try Estroven for hot flashes, or Estroven pm for night sweats.  Effexor or Celexa are options for hot flashes  Gabapentin is another option

## 2022-08-18 NOTE — Progress Notes (Signed)
61 y.o. [id number] Married   Black or African American Other or two or more races Not Hispanic or Latino female here for hot flashes.    Her cycles stopped 3 years ago. She has been having hot flashes for may years. They have gotten worse in the last year, having them 4-5 x in 24 hours. Lasts for up to a minute. She wakes up at night to void, gets a hot flash.  She was on gabapentin a few years ago for her vasomotor symptoms. She has neuropathy in her feet as well. She didn't want to be on the gabapentin long term.   Sleeping okay. Slight mood changes.  Not currently sexually active, husband had a stroke. When they have been sexually active, no pain.   Got covid in 2020, and in 6/23.   07/20/22 Taylor was 65.3  06/29/22 HgbA1C was 6.0  Patient's last menstrual period was 09/11/2017 (approximate).          Sexually active: No.  The current method of family planning is tubal ligation.    Exercising: No.   Patient is active  Smoker:  no  Health Maintenance: Pap:  19/10/21 WNL, neg HPV. 12/23/2018 WNL Hr HPV neg  History of abnormal Pap:  no MMG:  09/14/21 bi-rads 2 benign  BMD:   none  Colonoscopy: 2015 normal  TDaP:  10/17/2014 Gardasil: n/a   reports that she quit smoking about 16 years ago. Her smoking use included cigarettes. She smoked an average of 1.5 packs per day. She has never used smokeless tobacco. She reports current alcohol use. She reports that she does not use drugs. Rare ETOH. She has worked as a Marine scientist, an Glass blower/designer and a Engineer, structural.   Past Medical History:  Diagnosis Date   Allergy    Asthma    Chronic pancreatitis ([place])    Essential hypertension, benign    Menstrual migraine    Periodontitis    Prediabetes 10/17/2014   Primary osteoarthritis of both knees    Sinusitis    Urticaria     Past Surgical History:  Procedure Laterality Date   TUBAL LIGATION  12/26/1987    Current Outpatient Medications  Medication Sig Dispense Refill   cetirizine (ZYRTEC  ALLERGY) 10 MG tablet Take 1 tablet (10 mg total) by mouth daily. 90 tablet 1   hydrochlorothiazide (HYDRODIURIL) 25 MG tablet TAKE ONE TABLET BY MOUTH EVERY DAY 90 tablet 0   meloxicam (MOBIC) 7.5 MG tablet Take 1 tablet (7.5 mg total) by mouth daily. 30 tablet 0   Multiple Vitamins-Minerals (MULTIVITAMIN ADULT PO) Take by mouth.     Nutritional Supplements (IMMUNOCAL PO) Take by mouth.     No current facility-administered medications for this visit.    Family History  Problem Relation Age of Onset   Arthritis Mother    Hypertension Mother    CAD Father        MI in his 6s   Allergic rhinitis Father    Urticaria Sister    Colon cancer Sister    Allergic rhinitis Son    Asthma Son    Angioedema Neg Hx    Eczema Neg Hx    Immunodeficiency Neg Hx    Esophageal cancer Neg Hx    Stomach cancer Neg Hx    Rectal cancer Neg Hx     Review of Systems  All other systems reviewed and are negative.   Exam:   BP 136/78   Pulse 68   Ht 5'  2" (1.575 m)   Wt 190 lb (86.2 kg)   LMP 09/11/2017 (Approximate)   SpO2 100%   BMI 34.75 kg/m   Weight change: '@WEIGHTCHANGE'$ @ Height:   Height: '5\' 2"'$  (157.5 cm)  Ht Readings from Last 3 Encounters:  08/18/22 '5\' 2"'$  (1.575 m)  06/29/22 '5\' 3"'$  (1.6 m)  05/14/22 '5\' 3"'$  (1.6 m)    General appearance: alert, cooperative and appears stated age  CV risk calculator 11.42% risk of Heart disease in 10 years   1. Vasomotor symptoms due to menopause Elevated risk of heart disease, not a good candidate for HRT -discussed behavior changes and avoiding triggers -discussed OTC herbal products -discussed the option of gabapentin or SSRI-SNRI, she would like to try gabapentin - gabapentin (NEURONTIN) 100 MG capsule; Take one tablet po q am, can increase to one tablet bid after 3 days if tolerating, can increase to TID if tolerating after another 3 days.  Dispense: 90 capsule; Refill: 1 -F/U in one month  ~35 minutes was spent in total patient care

## 2022-09-02 ENCOUNTER — Telehealth: Payer: Self-pay | Admitting: Emergency Medicine

## 2022-09-02 NOTE — Telephone Encounter (Signed)
Copied from [place] [id number]. Topic: General - Other >> Sep 02, 2022 11:36 AM Cyndi Bender wrote: Reason for CRM: Pt requests to speak with Dr. Durenda Age nurse or assistant regarding the conversation they had about getting the paperwork for all of her visits through the [facility] system from March 2023 through the current date. Cb# [phone]

## 2022-09-06 ENCOUNTER — Ambulatory Visit
Admission: RE | Admit: 2022-09-06 | Discharge: 2022-09-06 | Disposition: A | Discharge status: Home / Self Care | Payer: Medicare Other | Source: Ambulatory Visit | Attending: Physician Assistant | Admitting: Physician Assistant

## 2022-09-06 ENCOUNTER — Ambulatory Visit
Admission: EM | Admit: 2022-09-06 | Discharge: 2022-09-06 | Disposition: A | Discharge status: Home / Self Care | Payer: Medicare Other

## 2022-09-06 DIAGNOSIS — W57XXXA Bitten or stung by nonvenomous insect and other nonvenomous arthropods, initial encounter: Secondary | ICD-10-CM | POA: Diagnosis not present

## 2022-09-06 DIAGNOSIS — Z1231 Encounter for screening mammogram for malignant neoplasm of breast: Secondary | ICD-10-CM | POA: Diagnosis not present

## 2022-09-06 DIAGNOSIS — S40262A Insect bite (nonvenomous) of left shoulder, initial encounter: Secondary | ICD-10-CM | POA: Diagnosis not present

## 2022-09-06 NOTE — Discharge Instructions (Signed)
At this time, I see no reason for you to be concerned about the possible side effects of the insect bite you sustained yesterday.  I have enclosed some information about insect bites, things to look for and when to seek medical attention that I hope you find helpful. Thank you for visiting [place] here at Urgent Care today.

## 2022-09-06 NOTE — ED Triage Notes (Addendum)
Pt c/o bug bite to left shoulder that occurred yesterday. The patient states when she felt the poke there was a pain that radiated down her left arm.

## 2022-09-06 NOTE — ED Provider Notes (Signed)
[facility]    CSN: [id number] Arrival date & time: 09/06/22  1720    HISTORY   Chief Complaint  Patient presents with   Insect Bite   HPI Janet Richards is a pleasant, 61 y.o. female who presents to urgent care today. Pt c/o bug bite to left shoulder that occurred yesterday afternoon. The patient states when she felt a pricking sensation she also felt and cold numbness radiating down her left arm to the tip of her index finger.  Pt states she has felt the same sensation twice since then so she decided to come to UC to day to get it "checked out".    The history is provided by the patient.   Past Medical History:  Diagnosis Date   Allergy    Asthma    Chronic pancreatitis ([place])    Essential hypertension, benign    Menstrual migraine    Periodontitis    Prediabetes 10/17/2014   Primary osteoarthritis of both knees    Sinusitis    Urticaria    Patient Active Problem List   Diagnosis Date Noted   Asthma 08/18/2022   Contact dermatitis 08/18/2022   Dermatitis due to drugs and medicines taken internally 08/18/2022   Eczema 08/18/2022   Lateral epicondylitis of elbow 08/18/2022   Low back pain 08/18/2022   Overweight 08/18/2022   History of tubal ligation 09/17/2021   Osteoarthritis of knee 09/17/2021   Vaccine counseling 04/09/2020   Palpitations 03/09/2020   Irritation of ear, bilateral 03/09/2020   Generalized headaches 02/18/2020   GERD (gastroesophageal reflux disease) 02/18/2020   Class 1 obesity due to excess calories with serious comorbidity and body mass index (BMI) of 33.0 to 33.9 in adult 01/19/2020   Dysuria 09/16/2019   Chronic pancreatitis ([place]) 04/10/2019   Environmental allergies 09/12/2018   Mild intermittent asthma without complication 54/65/0354   Dyspepsia 07/31/2018   Sensory neuronopathy 07/29/2018   Post-menopausal bleeding 05/21/2018   DDD (degenerative disc disease), cervical 02/22/2018   Periodontitis 12/15/2017   Vaginal  dryness, menopausal 03/01/2017   Abnormal ankle brachial index (ABI) 11/09/2016   Numbness and tingling of both feet 09/02/2016   Ganglion cyst of right foot 07/12/2016   History of colonic polyps 03/20/2015   Hyperlipidemia 02/20/2015   Other allergic rhinitis 01/14/2015   Prediabetes 10/17/2014   Osteoarthritis of first metatarsophalangeal joint 04/11/2014   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 Authorizing Provider  cetirizine (ZYRTEC ALLERGY) 10 MG tablet Take 1 tablet (10 mg total) by mouth daily. 06/29/22   Argentina Donovan, PA-C  gabapentin (NEURONTIN) 100 MG capsule Take one tablet po q am, can increase to one tablet bid after 3 days if tolerating, can increase to TID if tolerating after another 3 days. 08/18/22  Salvadore Dom, MD  hydrochlorothiazide (HYDRODIURIL) 25 MG tablet TAKE ONE TABLET BY MOUTH EVERY DAY 07/16/21   Ladell Pier, MD  meloxicam (MOBIC) 7.5 MG tablet Take 1 tablet (7.5 mg total) by mouth daily. 04/18/22   Trula Slade, DPM  Multiple Vitamins-Minerals (MULTIVITAMIN ADULT PO) Take by mouth.    [provider]  Nutritional Supplements (IMMUNOCAL PO) Take by mouth.    [provider]    Family History Family History  Problem Relation Age of Onset   Arthritis Mother    Hypertension Mother    CAD Father        MI in his 22s   Allergic rhinitis Father    Urticaria Sister    Colon cancer Sister    Allergic rhinitis Son    Asthma Son    Angioedema Neg Hx     Eczema Neg Hx    Immunodeficiency Neg Hx    Esophageal cancer Neg Hx    Stomach cancer Neg Hx    Rectal cancer Neg Hx    Social History Social History   Tobacco Use   Smoking status: Former    Packs/day: 1.50    Types: Cigarettes    Quit date: 02/05/2006    Years since quitting: 16.6   Smokeless tobacco: Never  Vaping Use   Vaping Use: Never used  Substance Use Topics   Alcohol use: Yes    Comment: rarely   Drug use: No   Allergies   Azithromycin and Clindamycin/lincomycin  Review of Systems Review of Systems Pertinent findings revealed after performing a 14 point review of systems has been noted in the history of present illness.  Physical Exam Triage Vital Signs ED Triage Vitals  Enc Vitals Group     BP 09/03/21 0827 (!) 147/82     Pulse Rate 09/03/21 0827 72     Resp 09/03/21 0827 18     Temp 09/03/21 0827 98.3 F (36.8 C)     Temp Source 09/03/21 0827 Oral     SpO2 09/03/21 0827 98 %     Weight --      Height --      Head Circumference --      Peak Flow --      Pain Score 09/03/21 0826 5     Pain Loc --      Pain Edu? --      Excl. in [place]? --   No data found.  Updated Vital Signs BP 133/64 (BP Location: Right Arm)   Pulse 94   Temp 98.9 F (37.2 C) (Oral)   Resp 16   LMP 09/11/2017 (Approximate)   SpO2 97%   Physical Exam Vitals and nursing note reviewed.  Constitutional:      General: She is not in acute distress.    Appearance: Normal appearance.  HENT:     Head: Normocephalic and atraumatic.  Eyes:     Pupils: Pupils are equal, round, and reactive to light.  Cardiovascular:     Rate and Rhythm: Normal rate and regular rhythm.  Pulmonary:     Effort: Pulmonary effort is normal.     Breath sounds: Normal breath sounds.  Musculoskeletal:        General: Normal range of motion.     Cervical back: Normal range of motion and neck supple.  Skin:    General: Skin is warm and dry.  Neurological:     General: No focal deficit present.  Mental Status: She is alert and oriented to person, place, and time. Mental status is at baseline.  Psychiatric:        Mood and Affect: Mood normal.        Behavior: Behavior normal.        Thought Content: Thought content normal.        Judgment: Judgment normal.     Visual Acuity Right Eye Distance:   Left Eye Distance:   Bilateral Distance:    Right Eye Near:   Left Eye Near:    Bilateral Near:     UC Couse / Diagnostics / Procedures:     Radiology No results found.  Procedures Procedures (including critical care time) EKG  Pending results:  Labs Reviewed - No data to display  Medications Ordered in UC: Medications - No data to display  UC Diagnoses / Final Clinical Impressions(s)   I have reviewed the triage vital signs and the nursing notes.  Pertinent labs & imaging results that were available during my care of the patient were reviewed by me and considered in my medical decision making (see chart for details).    Final diagnoses:  Insect bite of left shoulder, initial encounter   Physical exam of left shoulder and arm revealed no evidence of an insect bite.  Pt provided with handout XI:[id number] bites and when to seek medical attention.    ED Prescriptions   None    PDMP not reviewed this encounter.  Pending results:  Labs Reviewed - No data to display  Discharge Instructions:   Discharge Instructions      At this time, I see no reason for you to be concerned about the possible side effects of the insect bite you sustained yesterday.  I have enclosed some information about insect bites, things to look for and when to seek medical attention that I hope you find helpful. Thank you for visiting [place] here at Urgent Care today.       Disposition Upon Discharge:  Condition: stable for discharge home  Patient presented with an acute illness with associated systemic symptoms and significant discomfort requiring urgent management. In my opinion, this is a  condition that a prudent lay person (someone who possesses an average knowledge of health and medicine) may potentially expect to result in complications if not addressed urgently such as respiratory distress, impairment of bodily function or dysfunction of bodily organs.   Routine symptom specific, illness specific and/or disease specific instructions were discussed with the patient and/or caregiver at length.   As such, the patient has been evaluated and assessed, work-up was performed and treatment was provided in alignment with urgent care protocols and evidence based medicine.  Patient/parent/caregiver has been advised that the patient may require follow up for further testing and treatment if the symptoms continue in spite of treatment, as clinically indicated and appropriate.  Patient/parent/caregiver has been advised to return to the [facility] or PCP if no better; to PCP or the Emergency Department if new signs and symptoms develop, or if the current signs or symptoms continue to change or worsen for further workup, evaluation and treatment as clinically indicated and appropriate  The patient will follow up with their current PCP if and as advised. If the patient does not currently have a PCP we will assist them in obtaining one.   The patient may need specialty follow up if the symptoms continue, in spite of conservative treatment and management, for further workup, evaluation, consultation and treatment as  clinically indicated and appropriate.   Patient/parent/caregiver verbalized understanding and agreement of plan as discussed.  All questions were addressed during visit.  Please see discharge instructions below for further details of plan.  This office note has been dictated using Museum/gallery curator.  Unfortunately, this method of dictation can sometimes lead to typographical or grammatical errors.  .I apologize for your inconvenience in advance if this occurs.  Please do not  hesitate to reach out to me if clarification is needed.      Lynden Oxford Scales, [place] 09/09/22 [phone]

## 2022-09-08 ENCOUNTER — Other Ambulatory Visit: Payer: Self-pay | Admitting: Internal Medicine

## 2022-09-09 ENCOUNTER — Other Ambulatory Visit: Payer: Self-pay

## 2022-09-09 ENCOUNTER — Telehealth: Payer: Self-pay | Admitting: *Deleted

## 2022-09-09 ENCOUNTER — Emergency Department (HOSPITAL_BASED_OUTPATIENT_CLINIC_OR_DEPARTMENT_OTHER): Payer: Medicare Other

## 2022-09-09 ENCOUNTER — Ambulatory Visit: Payer: Self-pay

## 2022-09-09 ENCOUNTER — Encounter (HOSPITAL_BASED_OUTPATIENT_CLINIC_OR_DEPARTMENT_OTHER): Payer: Self-pay | Admitting: Emergency Medicine

## 2022-09-09 ENCOUNTER — Emergency Department (HOSPITAL_BASED_OUTPATIENT_CLINIC_OR_DEPARTMENT_OTHER)
Admission: EM | Admit: 2022-09-09 | Discharge: 2022-09-09 | Disposition: A | Discharge status: Home / Self Care | Payer: Medicare Other | Attending: Emergency Medicine | Admitting: Emergency Medicine

## 2022-09-09 DIAGNOSIS — M79602 Pain in left arm: Secondary | ICD-10-CM

## 2022-09-09 DIAGNOSIS — M25512 Pain in left shoulder: Secondary | ICD-10-CM | POA: Diagnosis not present

## 2022-09-09 DIAGNOSIS — R209 Unspecified disturbances of skin sensation: Secondary | ICD-10-CM | POA: Diagnosis not present

## 2022-09-09 DIAGNOSIS — R079 Chest pain, unspecified: Secondary | ICD-10-CM | POA: Insufficient documentation

## 2022-09-09 DIAGNOSIS — R0789 Other chest pain: Secondary | ICD-10-CM | POA: Diagnosis not present

## 2022-09-09 LAB — COMPREHENSIVE METABOLIC PANEL
ALT: 26 U/L (ref 0–44)
AST: 22 U/L (ref 15–41)
Albumin: 4.1 g/dL (ref 3.5–5.0)
Alkaline Phosphatase: 71 U/L (ref 38–126)
Anion gap: 10 (ref 5–15)
BUN: 14 mg/dL (ref 6–20)
CO2: 29 mmol/L (ref 22–32)
Calcium: 9.3 mg/dL (ref 8.9–10.3)
Chloride: 98 mmol/L (ref 98–111)
Creatinine, Ser: 0.79 mg/dL (ref 0.44–1.00)
GFR, Estimated: >60 mL/min (ref >60)
Glucose, Bld: 109 mg/dL — ABNORMAL HIGH (ref 70–99)
Potassium: 3.5 mmol/L (ref 3.5–5.1)
Sodium: 137 mmol/L (ref 135–145)
Total Bilirubin: 0.4 mg/dL (ref 0.3–1.2)
Total Protein: 7.6 g/dL (ref 6.5–8.1)

## 2022-09-09 LAB — CBC WITH DIFFERENTIAL/PLATELET
Abs Immature Granulocytes: 0.02 10*3/uL (ref 0.00–0.07)
Basophils Absolute: 0 10*3/uL (ref 0.0–0.1)
Basophils Relative: 0 %
Eosinophils Absolute: 0.3 10*3/uL (ref 0.0–0.5)
Eosinophils Relative: 4 %
HCT: 39.1 % (ref 36.0–46.0)
Hemoglobin: 13.3 g/dL (ref 12.0–15.0)
Immature Granulocytes: 0 %
Lymphocytes Relative: 48 %
Lymphs Abs: 2.9 10*3/uL (ref 0.7–4.0)
MCH: 30.6 pg (ref 26.0–34.0)
MCHC: 34 g/dL (ref 30.0–36.0)
MCV: 89.9 fL (ref 80.0–100.0)
Monocytes Absolute: 0.4 10*3/uL (ref 0.1–1.0)
Monocytes Relative: 7 %
Neutro Abs: 2.5 10*3/uL (ref 1.7–7.7)
Neutrophils Relative %: 41 %
Platelets: 367 10*3/uL (ref 150–400)
RBC: 4.35 MIL/uL (ref 3.87–5.11)
RDW: 13.7 % (ref 11.5–15.5)
WBC: 6.1 10*3/uL (ref 4.0–10.5)
nRBC: 0 % (ref 0.0–0.2)

## 2022-09-09 LAB — TROPONIN I (HIGH SENSITIVITY): Troponin I (High Sensitivity): 5 ng/L (ref <18)

## 2022-09-09 MED ORDER — IBUPROFEN 600 MG PO TABS: 600.0000 mg | ORAL_TABLET | Freq: Four times a day (QID) | ORAL | 0 refills | Status: DC | PRN

## 2022-09-09 MED ORDER — PREDNISONE 10 MG (21) PO TBPK: ORAL_TABLET | Freq: Every day | ORAL | 0 refills | Status: DC

## 2022-09-09 NOTE — Discharge Instructions (Signed)
At the visit today was overall reassuring.  Recommend steroid pack as discussed for your symptoms.  Recommend follow-up with PCP in 3 to 5 days for reevaluation.  Please do not hesitate to return to emergency department for worrisome signs symptoms we discussed, parent.

## 2022-09-09 NOTE — Telephone Encounter (Signed)
  Chief Complaint: Chest, shoulder  and arm pain Symptoms: above Frequency: Since 09/06/2022 Pertinent Negatives: Patient denies  Disposition: '[x]'$ ED /'[]'$ Urgent Care (no appt availability in office) / '[]'$ Appointment(In office/virtual)/ '[]'$  [facility]/ '[]'$ Home Care/ '[]'$ Refused Recommended Disposition /'[]'$ [place] [facility]/ '[]'$  Follow-up with PCP Additional Notes: PT felt a sharp stabbing sensation on her left shoulder 09/06/2022. That was followed by numbness and tingling down her left arm. Also chest pain. Pt went to UC and was released. Pt states that this pain and chest pain have continued. Pt states that pain is intermittent. Pt is concerned that the stabbing pain was a spider bite and what ever the spider had in it is in her causing problems now.   Reason for Disposition  [1] Chest pain (or "angina") comes and goes AND [2] is happening more often (increasing in frequency) or getting worse (increasing in severity)  (Exception: Chest pains that last only a few seconds.)  Answer Assessment - Initial Assessment Questions 1. LOCATION: "Where does it hurt?"       Left arm , Shoulder and chest 2. RADIATION: "Does the pain go anywhere else?" (e.g., into neck, jaw, arms, back)     Left shoulder/arm chest 3. ONSET: "When did the chest pain begin?" (Minutes, hours or days)      09/06/2022 4. PATTERN: "Does the pain come and go, or has it been constant since it started?"  "Does it get worse with exertion?"      Comes and goes 5. DURATION: "How long does it last" (e.g., seconds, minutes, hours)     A little bit 6. SEVERITY: "How bad is the pain?"  (e.g., Scale 1-10; mild, moderate, or severe)    - MILD (1-3): doesn't interfere with normal activities     - MODERATE (4-7): interferes with normal activities or awakens from sleep    - SEVERE (8-10): excruciating pain, unable to do any normal activities        7. CARDIAC RISK FACTORS: "Do you have any history of heart problems or risk factors  for heart disease?" (e.g., angina, prior heart attack; diabetes, high blood pressure, high cholesterol, smoker, or strong family history of heart disease)     Heart murmur as a child 8. PULMONARY RISK FACTORS: "Do you have any history of lung disease?"  (e.g., blood clots in lung, asthma, emphysema, birth control pills)      9. CAUSE: "What do you think is causing the chest pain?"     Unsure - What ever stung her in her arm 10. OTHER SYMPTOMS: "Do you have any other symptoms?" (e.g., dizziness, nausea, vomiting, sweating, fever, difficulty breathing, cough)        11. PREGNANCY: "Is there any chance you are pregnant?" "When was your last menstrual period?"  Protocols used: Chest Pain-A-AH

## 2022-09-09 NOTE — ED Provider Notes (Signed)
[facility] EMERGENCY DEPARTMENT Provider Note   CSN: [id number] Arrival date & time: 09/09/22  1845     History  Chief Complaint  Patient presents with   Janet Richards is a 61 y.o. female.  HPI   61 year old female presents emergency department with complaints of left arm burning/shooting type sensation, chest pain.  Patient states that she experienced a stinging like sensation on her left shoulder approximately 4 days ago.  She states that since then, she has noticed some burning-like pain radiating down her left arm from her elbow down to her hand.  She also reports bilateral tingling in both fingers chest pain was described as sharp and stabbing in nature initially after the incident.  She states she has had minimal feelings of the same since.  She was seen at an urgent care on 10/31 with no acute signs of insect bite or surrounding changes in skin discharge.  She presents emergency department for further evaluation.  Denies any known rash, pruritus, fever, chills, weakness/sensory deficits, gait abnormality, slurred speech, facial droop, visual disturbance.  Patient states she is taken no medication for this.  Past medical history significant for chronic pancreatitis, prediabetes, asthma  Home Medications Prior to Admission medications   Medication Sig Start Date End Date Taking? Authorizing Provider  ibuprofen (ADVIL) 600 MG tablet Take 1 tablet (600 mg total) by mouth every 6 (six) hours as needed. 09/09/22  Yes Dion Saucier A, PA  predniSONE (STERAPRED UNI-PAK 21 TAB) 10 MG (21) TBPK tablet Take by mouth daily. Take 6 tabs by mouth daily  for 2 days, then 5 tabs for 2 days, then 4 tabs for 2 days, then 3 tabs for 2 days, 2 tabs for 2 days, then 1 tab by mouth daily for 2 days 09/09/22  Yes Dion Saucier A, PA  cetirizine (ZYRTEC ALLERGY) 10 MG tablet Take 1 tablet (10 mg total) by mouth daily. 06/29/22   Argentina Donovan, PA-C  gabapentin  (NEURONTIN) 100 MG capsule Take one tablet po q am, can increase to one tablet bid after 3 days if tolerating, can increase to TID if tolerating after another 3 days. 08/18/22   Salvadore Dom, MD  hydrochlorothiazide (HYDRODIURIL) 25 MG tablet Take one tablet (25 mg dose) by mouth daily. 09/08/22   Ladell Pier, MD  meloxicam (MOBIC) 7.5 MG tablet Take 1 tablet (7.5 mg total) by mouth daily. 04/18/22   Trula Slade, DPM  Multiple Vitamins-Minerals (MULTIVITAMIN ADULT PO) Take by mouth.    [provider]  Nutritional Supplements (IMMUNOCAL PO) Take by mouth.    [provider]      Allergies    Azithromycin, Clindamycin/lincomycin, and Erythromycin    Review of Systems   Review of Systems  All other systems reviewed and are negative.   Physical Exam Updated Vital Signs BP (!) 154/87   Pulse 82   Temp 98.3 F (36.8 C) (Oral)   Resp 17   Ht '5\' 2"'$  (1.575 m)   Wt 86.2 kg   LMP 09/11/2017 (Approximate)   SpO2 97%   BMI 34.76 kg/m  Physical Exam Vitals and nursing note reviewed.  Constitutional:      General: She is not in acute distress.    Appearance: She is well-developed.  HENT:     Head: Normocephalic and atraumatic.  Eyes:     Conjunctiva/sclera: Conjunctivae normal.  Cardiovascular:     Rate and  Rhythm: Normal rate and regular rhythm.     Heart sounds: No murmur heard. Pulmonary:     Effort: Pulmonary effort is normal. No respiratory distress.     Breath sounds: Normal breath sounds.  Abdominal:     Palpations: Abdomen is soft.     Tenderness: There is no abdominal tenderness.  Musculoskeletal:        General: No swelling.     Cervical back: Neck supple.     Comments: Patient has full range of motion of bilateral upper extremities.  No sensory deficits along major nerve distributions of upper extremities.  Radial pulses full and intact bilaterally.  Muscle strength 5 out of 5 for upper extremities.  Patient complaining of  shooting/burning type sensation from her left elbow down to her left hand.  No observable rash noted on patient's left shoulder where insect was set to bite.  No surrounding skin changes.  No obvious rash noted.  Compartment soft and supple.  Patient has tenderness palpation along left trapezial ridge.  Pain seems down left arm seem to be worsened with palpation and contraction of the left trapezius.   Skin:    General: Skin is warm and dry.     Capillary Refill: Capillary refill takes less than 2 seconds.  Neurological:     Mental Status: She is alert.  Psychiatric:        Mood and Affect: Mood normal.     ED Results / Procedures / Treatments   Labs (all labs ordered are listed, but only abnormal results are displayed) Labs Reviewed  COMPREHENSIVE METABOLIC PANEL - Abnormal; Notable for the following components:      Result Value   Glucose, Bld 109 (*)    All other components within normal limits  CBC WITH DIFFERENTIAL/PLATELET  TROPONIN I (HIGH SENSITIVITY)  TROPONIN I (HIGH SENSITIVITY)    EKG EKG Interpretation  Date/Time:  Friday September 09 2022 18:58:16 EDT Ventricular Rate:  85 PR Interval:  148 QRS Duration: 94 QT Interval:  370 QTC Calculation: 440 R Axis:   75 Text Interpretation: Normal sinus rhythm Normal ECG When compared with ECG of 14-May-2022 22:02, ST depression inferior new Confirmed by Aletta Edouard [phone]) on 09/09/2022 7:07:14 PM  Radiology DG Chest Port 1 View  Result Date: 09/09/2022 CLINICAL DATA:  Chest pain. EXAM: PORTABLE CHEST 1 VIEW COMPARISON:  04/24/2022 FINDINGS: Mild patient rotation.The cardiomediastinal contours are normal. The lungs are clear. Pulmonary vasculature is normal. No consolidation, pleural effusion, or pneumothorax. No acute osseous abnormalities are seen. IMPRESSION: No acute chest findings. Electronically Signed   By: Keith Rake M.D.   On: 09/09/2022 21:01    Procedures Procedures    Medications Ordered in  ED Medications - No data to display  ED Course/ Medical Decision Making/ A&P Clinical Course as of 09/09/22 2308  [facility] Sep 09, 2022  2032 EKG did not cross in epic.  Normal sinus rhythm rate of 85 slight ST depressions inferiorly. [MB]    Clinical Course User Index [MB] Hayden Rasmussen, MD                           Medical Decision Making Amount and/or Complexity of Data Reviewed Labs: ordered. Radiology: ordered.   This patient presents to the ED for concern of left arm pain, this involves an extensive number of treatment options, and is a complaint that carries with it a high risk of complications and morbidity.  The  differential diagnosis includes fracture, strain/sprain, dislocation, thoracic outlet syndrome, ACS, ischemic limb, neuropathy   Co morbidities that complicate the patient evaluation  See HPI   Additional history obtained:  Additional history obtained from EMR External records from outside source obtained and reviewed including hospital records   Lab Tests:  I Ordered, and personally interpreted labs.  The pertinent results include: No leukocytosis noted.  No evidence anemia.  Platelets normal range.  No electrolyte abnormalities noted.  Renal function within normal limits.  No transaminitis noted.  Initial troponin of 5.   Imaging Studies ordered:  I ordered imaging studies including chest x-ray I independently visualized and interpreted imaging which showed no acute cardiopulmonary abnormalities I agree with the radiologist interpretation   Cardiac Monitoring: / EKG:  The patient was maintained on a cardiac monitor.  I personally viewed and interpreted the cardiac monitored which showed an underlying rhythm of: Sinus rhythm with mild ST depressions in inferior leads with no reciprocal changes.   Consultations Obtained:  N/a   Problem List / ED Course / Critical interventions / Medication management  Left arm pain Reevaluation of the patient  showed that the patient stayed the same I have reviewed the patients home medicines and have made adjustments as needed   Social Determinants of Health:  Denies tobacco, illicit drug use   Test / Admission - Considered:  Left arm pain  Vitals signs significant for mild hypertension with blood pressure 154/87.  Recommend close follow-up with PCP regarding elevation blood pressure.. Otherwise within normal range and stable throughout visit. Laboratory/imaging studies significant for: See above No evidence of ischemic limb.  No evidence of acute fracture/dislocation.  Compartments soft and supple.  Doubt thoracic outlet syndrome.  Doubt ACS.  Patient symptoms could be neuropathic in nature but no history of diabetes on record.  Patient be treated outpatient with oral corticosteroids and gauge response.  Recommend close follow-up with PCP in 3 to 5 days.  Treatment plan discussed and patient she knowledge understanding was agreeable to said plan. Worrisome signs and symptoms were discussed with the patient, and the patient acknowledged understanding to return to the ED if noticed. Patient was stable upon discharge.          Final Clinical Impression(s) / ED Diagnoses Final diagnoses:  Left arm pain    Rx / DC Orders ED Discharge Orders          Ordered    predniSONE (STERAPRED UNI-PAK 21 TAB) 10 MG (21) TBPK tablet  Daily        09/09/22 2306    ibuprofen (ADVIL) 600 MG tablet  Every 6 hours PRN        09/09/22 2306              Wilnette Kales, [place] 09/09/22 2309    Hayden Rasmussen, MD 09/10/22 1001

## 2022-09-09 NOTE — Telephone Encounter (Signed)
Called patient and she is aware of note

## 2022-09-09 NOTE — Telephone Encounter (Signed)
Pt called for imaging results, mammogram. Read message from Dr. Wynetta Emery. Pt verbalizes understanding.   Your mammogram came back okay.  You will be due again in 1 year.

## 2022-09-09 NOTE — ED Triage Notes (Signed)
Tingling and burning in left arm and chest pain after feeling like she was bit by a bug on the left shoulder. This occurred 4 days ago. Intermittent burning sensation remains in left arm. Seen at [facility] on 10/31 for same. Denies current chest pain.

## 2022-09-15 ENCOUNTER — Telehealth: Payer: Self-pay | Admitting: Licensed Clinical Social Worker

## 2022-09-15 NOTE — Patient Outreach (Signed)
  Care Coordination   Initial Visit Note   09/15/2022 Name: Janet Richards MRN: [id number] DOB: [birth year]  Janet Richards is a 61 y.o. year old female who sees Ladell Pier, MD for primary care. I spoke with  Retta Mac by phone today.  What matters to the patients health and wellness today?  Care Coordination    Goals Addressed             This Visit's Progress    COMPLETED: Care Coordination Activities-No follow up required       Care Coordination Interventions: Mindfulness or Relaxation training provided Active listening / Reflection utilized  Emotional Support Provided Verbalization of feelings encouraged  LCSW informed patient of care coordination services. Pt is not interested at this time and agreed to contact PCP, should needs arise  Patient reports unsatisfactory patient care at [facility] Pt shared she received a minimal assessment; therefore, went to the ED to be further assessed for severity of symptoms, including, burning sensation on arms through fingers Patient plans to track symptoms and correlation between compliance with prescribed medication for hypertension LCSW strongly encouraged patient to contact PCP and/or care coordination team with any concerns or questions regarding management of health conditions Pt receives strong support from family. She was successful in identifying healthy coping skills LCSW identified strengths of pt and commended her for advocating for self           SDOH assessments and interventions completed:  Yes  SDOH Interventions Today    Flowsheet Row Most Recent Value  SDOH Interventions   Housing Interventions Intervention Not Indicated  Transportation Interventions Intervention Not Indicated        Care Coordination Interventions Activated:  Yes  Care Coordination Interventions:  Yes, provided   Follow up plan: No further intervention required.   Encounter Outcome:  Pt. Visit  Completed   Christa See, MSW, [email or web address] Phone [phone] 10:44 PM

## 2022-09-15 NOTE — Patient Instructions (Signed)
Visit Information  Thank you for taking time to visit with me today. Please don't hesitate to contact me if I can be of assistance to you.   Following are the goals we discussed today:   Goals Addressed             This Visit's Progress    COMPLETED: Care Coordination Activities-No follow up required       Care Coordination Interventions: Mindfulness or Relaxation training provided Active listening / Reflection utilized  Emotional Support Provided Verbalization of feelings encouraged  LCSW informed patient of care coordination services. Pt is not interested at this time and agreed to contact PCP, should needs arise  Patient reports unsatisfactory patient care at [facility]) Pt shared she received a minimal assessment; therefore, went to the ED to be further assessed for severity of symptoms, including, burning sensation on arms through fingers Patient plans to track symptoms and correlation between compliance with prescribed medication for hypertension LCSW strongly encouraged patient to contact PCP and/or care coordination team with any concerns or questions regarding management of health conditions Pt receives strong support from family. She was successful in identifying healthy coping skills LCSW identified strengths of pt and commended her for advocating for self          If you are experiencing a Mental Health or [place] or need someone to talk to, please call the Suicide and Crisis Lifeline: 988 call 911   Patient verbalizes understanding of instructions and care plan provided today and agrees to view in [place]. Active MyChart status and patient understanding of how to access instructions and care plan via MyChart confirmed with patient.     No further follow up required:    Christa See, MSW, [email or web address] Phone [phone] 10:44 PM

## 2022-09-20 ENCOUNTER — Telehealth: Payer: Self-pay | Admitting: *Deleted

## 2022-09-20 NOTE — Telephone Encounter (Signed)
Patient called requesting to start back on premarin 0.625 mg cream, reports she took the gabapentin 100 mg capsules for about 2 weeks and stopped. She would prefer to use the premarin vaginal cream. Patient was scheduled for 1 month follow up visit scheduled on 09/22/22 ,however she had to cancel due to her husband having health problems. Please advise

## 2022-09-20 NOTE — Telephone Encounter (Signed)
The vaginal premarin cream is for vaginal atrophy and pain with sex. She didn't have any of those complaints at the time of her visit last month.  She was started on gabapentin for vasomotor symptoms. The premarin isn't used for vasomotor symptoms. Please find out what her symptoms are.

## 2022-09-20 NOTE — Telephone Encounter (Signed)
Patient said she does have pain with sex and vaginal dryness. Patient said she does have sex with husband (just not frequent) she reports having hot flashes as well. Patient said gabapentin has not helped much for vasomotor symptoms.   Please advise

## 2022-09-22 ENCOUNTER — Ambulatory Visit: Admitting: Obstetrics and Gynecology

## 2022-10-06 ENCOUNTER — Ambulatory Visit: Payer: Self-pay | Admitting: *Deleted

## 2022-10-06 NOTE — Telephone Encounter (Signed)
Reason for Disposition  [1] SEVERE diarrhea (e.g., 7 or more times / day more than normal) AND [2] present > 24 hours (1 day)  Answer Assessment - Initial Assessment Questions 1. DIARRHEA SEVERITY: "How bad is the diarrhea?" "How many more stools have you had in the past 24 hours than normal?"    - NO DIARRHEA (SCALE 0)   - MILD (SCALE 1-3): Few loose or mushy BMs; increase of 1-3 stools over normal daily number of stools; mild increase in ostomy output.   -  MODERATE (SCALE 4-7): Increase of 4-6 stools daily over normal; moderate increase in ostomy output.   -  SEVERE (SCALE 8-10; OR "WORST POSSIBLE"): Increase of 7 or more stools daily over normal; moderate increase in ostomy output; incontinence.     Severe- 12 or more 2. ONSET: "When did the diarrhea begin?"      1 week 3. BM CONSISTENCY: "How loose or watery is the diarrhea?"      watery 4. VOMITING: "Are you also vomiting?" If Yes, ask: "How many times in the past 24 hours?"      no 5. ABDOMEN PAIN: "Are you having any abdomen pain?" If Yes, ask: "What does it feel like?" (e.g., crampy, dull, intermittent, constant)      Stomach is tight- bloating 6. ABDOMEN PAIN SEVERITY: If present, ask: "How bad is the pain?"  (e.g., Scale 1-10; mild, moderate, or severe)   - MILD (1-3): doesn't interfere with normal activities, abdomen soft and not tender to touch    - MODERATE (4-7): interferes with normal activities or awakens from sleep, abdomen tender to touch    - SEVERE (8-10): excruciating pain, doubled over, unable to do any normal activities       Cramping for 1 day- no pain now 7. ORAL INTAKE: If vomiting, "Have you been able to drink liquids?" "How much liquids have you had in the past 24 hours?"     Yes- water, OJ, Cranberry juice- daily 8. HYDRATION: "Any signs of dehydration?" (e.g., dry mouth [not just dry lips], too weak to stand, dizziness, new weight loss) "When did you last urinate?"     Dizzy- not sure of cause 9. EXPOSURE:  "Have you traveled to a foreign country recently?" "Have you been exposed to anyone with diarrhea?" "Could you have eaten any food that was spoiled?"     Possible mold exposure 10. ANTIBIOTIC USE: "Are you taking antibiotics now or have you taken antibiotics in the past 2 months?"       no 11. OTHER SYMPTOMS: "Do you have any other symptoms?" (e.g., fever, blood in stool)       Skin irritation  Protocols used: Diarrhea-A-AH

## 2022-10-06 NOTE — Telephone Encounter (Signed)
  Chief Complaint: diarrhea Symptoms: liquid stools- 12+/day Frequency: 1 week Pertinent Negatives: Patient denies fever, blood in stool Disposition: '[]'$ ED /'[]'$ Urgent Care (no appt availability in office) / '[]'$ Appointment(In office/virtual)/ '[]'$  [facility]/ '[]'$ Home Care/ '[]'$ Refused Recommended Disposition /'[x]'$ [facility]/ '[]'$  Follow-up with PCP Additional Notes: Offered appointment in office- patient declines- her husband has time conflicting appointments today. Advised mobile unit- address and times given

## 2022-10-10 ENCOUNTER — Ambulatory Visit: Payer: Medicare Other | Attending: Internal Medicine

## 2022-10-10 VITALS — Ht 62.0 in | Wt 186.0 lb

## 2022-10-10 DIAGNOSIS — Z0000 Encounter for general adult medical examination without abnormal findings: Secondary | ICD-10-CM

## 2022-10-10 NOTE — Patient Instructions (Addendum)
Janet Richards , Thank you for taking time to come for your Medicare Wellness Visit. I appreciate your ongoing commitment to your health goals. Please review the following plan we discussed and let me know if I can assist you in the future.   These are the goals we discussed:  Goals      Increase physical activity     Patient Stated     10/10/2022, wants to lose weight        This is a list of the screening recommended for you and due dates:  Health Maintenance  Topic Date Due   Zoster (Shingles) Vaccine (2 of 2) 03/24/2021   Flu Shot  06/07/2022   COVID-19 Vaccine (6 - 2023-24 season) 07/08/2022   Pap Smear  10/22/2022   Medicare Annual Wellness Visit  10/11/2023   Mammogram  09/06/2024   DTaP/Tdap/Td vaccine (3 - Td or Tdap) 10/17/2024   Colon Cancer Screening  03/19/2025   Hepatitis C Screening: USPSTF Recommendation to screen - Ages 18-79 yo.  Completed   HIV Screening  Completed   HPV Vaccine  Aged Out    Advanced directives: Please bring a copy of your POA (Power of [place]) and/or Living Will to your next appointment.   Conditions/risks identified: none  Next appointment: Follow up in one year for your annual wellness visit.   Preventive Care 40-64 Years, Female Preventive care refers to lifestyle choices and visits with your health care provider that can promote health and wellness. What does preventive care include? A yearly physical exam. This is also called an annual well check. Dental exams once or twice a year. Routine eye exams. Ask your health care provider how often you should have your eyes checked. Personal lifestyle choices, including: Daily care of your teeth and gums. Regular physical activity. Eating a healthy diet. Avoiding tobacco and drug use. Limiting alcohol use. Practicing safe sex. Taking low-dose aspirin daily starting at age 11. Taking vitamin and mineral supplements as recommended by your health care provider. What happens during an annual  well check? The services and screenings done by your health care provider during your annual well check will depend on your age, overall health, lifestyle risk factors, and family history of disease. Counseling  Your health care provider may ask you questions about your: Alcohol use. Tobacco use. Drug use. Emotional well-being. Home and relationship well-being. Sexual activity. Eating habits. Work and work Statistician. Method of birth control. Menstrual cycle. Pregnancy history. Screening  You may have the following tests or measurements: Height, weight, and BMI. Blood pressure. Lipid and cholesterol levels. These may be checked every 5 years, or more frequently if you are over 73 years old. Skin check. Lung cancer screening. You may have this screening every year starting at age 50 if you have a 30-pack-year history of smoking and currently smoke or have quit within the past 15 years. Fecal occult blood test (FOBT) of the stool. You may have this test every year starting at age 80. Flexible sigmoidoscopy or colonoscopy. You may have a sigmoidoscopy every 5 years or a colonoscopy every 10 years starting at age 29. Hepatitis C blood test. Hepatitis B blood test. Sexually transmitted disease (STD) testing. Diabetes screening. This is done by checking your blood sugar (glucose) after you have not eaten for a while (fasting). You may have this done every 1-3 years. Mammogram. This may be done every 1-2 years. Talk to your health care provider about when you should start having regular mammograms.  This may depend on whether you have a family history of breast cancer. BRCA-related cancer screening. This may be done if you have a family history of breast, ovarian, tubal, or peritoneal cancers. Pelvic exam and Pap test. This may be done every 3 years starting at age 46. Starting at age 90, this may be done every 5 years if you have a Pap test in combination with an HPV test. Bone density scan.  This is done to screen for osteoporosis. You may have this scan if you are at high risk for osteoporosis. Discuss your test results, treatment options, and if necessary, the need for more tests with your health care provider. Vaccines  Your health care provider may recommend certain vaccines, such as: Influenza vaccine. This is recommended every year. Tetanus, diphtheria, and acellular pertussis (Tdap, Td) vaccine. You may need a Td booster every 10 years. Zoster vaccine. You may need this after age 76. Pneumococcal 13-valent conjugate (PCV13) vaccine. You may need this if you have certain conditions and were not previously vaccinated. Pneumococcal polysaccharide (PPSV23) vaccine. You may need one or two doses if you smoke cigarettes or if you have certain conditions. Talk to your health care provider about which screenings and vaccines you need and how often you need them. This information is not intended to replace advice given to you by your health care provider. Make sure you discuss any questions you have with your health care provider. Document Released: 11/20/2015 Document Revised: 07/13/2016 Document Reviewed: 08/25/2015 Elsevier Interactive Patient Education  2017 [employer] Prevention in the Home Falls can cause injuries. They can happen to people of all ages. There are many things you can do to make your home safe and to help prevent falls. What can I do on the outside of my home? Regularly fix the edges of walkways and driveways and fix any cracks. Remove anything that might make you trip as you walk through a door, such as a raised step or threshold. Trim any bushes or trees on the path to your home. Use bright outdoor lighting. Clear any walking paths of anything that might make someone trip, such as rocks or tools. Regularly check to see if handrails are loose or broken. Make sure that both sides of any steps have handrails. Any raised decks and porches should have  guardrails on the edges. Have any leaves, snow, or ice cleared regularly. Use sand or salt on walking paths during winter. Clean up any spills in your garage right away. This includes oil or grease spills. What can I do in the bathroom? Use night lights. Install grab bars by the toilet and in the tub and shower. Do not use towel bars as grab bars. Use non-skid mats or decals in the tub or shower. If you need to sit down in the shower, use a plastic, non-slip stool. Keep the floor dry. Clean up any water that spills on the floor as soon as it happens. Remove soap buildup in the tub or shower regularly. Attach bath mats securely with double-sided non-slip rug tape. Do not have throw rugs and other things on the floor that can make you trip. What can I do in the bedroom? Use night lights. Make sure that you have a light by your bed that is easy to reach. Do not use any sheets or blankets that are too big for your bed. They should not hang down onto the floor. Have a firm chair that has side arms.  You can use this for support while you get dressed. Do not have throw rugs and other things on the floor that can make you trip. What can I do in the kitchen? Clean up any spills right away. Avoid walking on wet floors. Keep items that you use a lot in easy-to-reach places. If you need to reach something above you, use a strong step stool that has a grab bar. Keep electrical cords out of the way. Do not use floor polish or wax that makes floors slippery. If you must use wax, use non-skid floor wax. Do not have throw rugs and other things on the floor that can make you trip. What can I do with my stairs? Do not leave any items on the stairs. Make sure that there are handrails on both sides of the stairs and use them. Fix handrails that are broken or loose. Make sure that handrails are as long as the stairways. Check any carpeting to make sure that it is firmly attached to the stairs. Fix any carpet  that is loose or worn. Avoid having throw rugs at the top or bottom of the stairs. If you do have throw rugs, attach them to the floor with carpet tape. Make sure that you have a light switch at the top of the stairs and the bottom of the stairs. If you do not have them, ask someone to add them for you. What else can I do to help prevent falls? Wear shoes that: Do not have high heels. Have rubber bottoms. Are comfortable and fit you well. Are closed at the toe. Do not wear sandals. If you use a stepladder: Make sure that it is fully opened. Do not climb a closed stepladder. Make sure that both sides of the stepladder are locked into place. Ask someone to hold it for you, if possible. Clearly mark and make sure that you can see: Any grab bars or handrails. First and last steps. Where the edge of each step is. Use tools that help you move around (mobility aids) if they are needed. These include: Canes. Walkers. Scooters. Crutches. Turn on the lights when you go into a dark area. Replace any light bulbs as soon as they burn out. Set up your furniture so you have a clear path. Avoid moving your furniture around. If any of your floors are uneven, fix them. If there are any pets around you, be aware of where they are. Review your medicines with your doctor. Some medicines can make you feel dizzy. This can increase your chance of falling. Ask your doctor what other things that you can do to help prevent falls. This information is not intended to replace advice given to you by your health care provider. Make sure you discuss any questions you have with your health care provider. Document Released: 08/20/2009 Document Revised: 03/31/2016 Document Reviewed: 11/28/2014 Elsevier Interactive Patient Education  2017 [employer].

## 2022-10-10 NOTE — Progress Notes (Addendum)
I connected with Janet Richards today by telephone and verified that I am speaking with the correct person using two identifiers. Location patient: home Location provider: work Persons participating in the virtual visit: Jaleya Aditi, [place] LPN.   I discussed the limitations, risks, security and privacy concerns of performing an evaluation and management service by telephone and the availability of in person appointments. I also discussed with the patient that there may be a patient responsible charge related to this service. The patient expressed understanding and verbally consented to this telephonic visit.    Interactive audio and video telecommunications were attempted between this provider and patient, however failed, due to patient having technical difficulties OR patient did not have access to video capability.  We continued and completed visit with audio only.     Vital signs may be patient reported or missing.  Subjective:   Janet Richards is a 61 y.o. female who presents for Medicare Annual (Subsequent) preventive examination.  Review of Systems     Cardiac Risk Factors include: dyslipidemia;hypertension;obesity (BMI >30kg/m2)     Objective:    Today's Vitals   10/10/22 1411  Weight: 186 lb (84.4 kg)  Height: '5\' 2"'$  (1.575 m)   Body mass index is 34.02 kg/m.     10/10/2022    2:21 PM 09/09/2022    6:55 PM 05/14/2022    9:55 PM 04/24/2022    6:33 PM 03/25/2022   10:00 AM 03/16/2022   12:23 PM 12/11/2019    2:56 PM  Advanced Directives  Does Patient Have a Medical Advance Directive? Yes No No No Yes No Yes  Type of Theatre manager of [place];Living will  Does patient want to make changes to medical advance directive?       No - Patient declined  Copy of [place] in Chart? No - copy requested    No - copy requested  Yes - validated most recent copy scanned in  chart (See row information)  Would patient like information on creating a medical advance directive?   No - Patient declined        Current Medications (verified) Outpatient Encounter Medications as of 10/10/2022  Medication Sig   cetirizine (ZYRTEC ALLERGY) 10 MG tablet Take 1 tablet (10 mg total) by mouth daily.   hydrochlorothiazide (HYDRODIURIL) 25 MG tablet Take one tablet (25 mg dose) by mouth daily.   ibuprofen (ADVIL) 600 MG tablet Take 1 tablet (600 mg total) by mouth every 6 (six) hours as needed.   Multiple Vitamins-Minerals (MULTIVITAMIN ADULT PO) Take by mouth.   Nutritional Supplements (IMMUNOCAL PO) Take by mouth.   gabapentin (NEURONTIN) 100 MG capsule Take one tablet po q am, can increase to one tablet bid after 3 days if tolerating, can increase to TID if tolerating after another 3 days. (Patient not taking: Reported on 10/10/2022)   meloxicam (MOBIC) 7.5 MG tablet Take 1 tablet (7.5 mg total) by mouth daily. (Patient not taking: Reported on 10/10/2022)   predniSONE (STERAPRED UNI-PAK 21 TAB) 10 MG (21) TBPK tablet Take by mouth daily. Take 6 tabs by mouth daily  for 2 days, then 5 tabs for 2 days, then 4 tabs for 2 days, then 3 tabs for 2 days, 2 tabs for 2 days, then 1 tab by mouth daily for 2 days (Patient not taking: Reported on 10/10/2022)   No facility-administered encounter medications on file as of  10/10/2022.    Allergies (verified) Azithromycin, Clindamycin/lincomycin, and Erythromycin   History: Past Medical History:  Diagnosis Date   Allergy    Asthma    Chronic pancreatitis ([place])    Essential hypertension, benign    Menstrual migraine    Periodontitis    Prediabetes 10/17/2014   Primary osteoarthritis of both knees    Sinusitis    Urticaria    Past Surgical History:  Procedure Laterality Date   TUBAL LIGATION  12/26/1987   Family History  Problem Relation Age of Onset   Arthritis Mother    Hypertension Mother    CAD Father        MI in his 5s    Allergic rhinitis Father    Urticaria Sister    Colon cancer Sister    Allergic rhinitis Son    Asthma Son    Angioedema Neg Hx    Eczema Neg Hx    Immunodeficiency Neg Hx    Esophageal cancer Neg Hx    Stomach cancer Neg Hx    Rectal cancer Neg Hx    Social History   Socioeconomic History   Marital status: Married    Spouse name: Not on file   Number of children: 3   Years of education: Not on file   Highest education level: Not on file  Occupational History   Occupation: homemaker  Tobacco Use   Smoking status: Former    Packs/day: 1.50    Types: Cigarettes    Quit date: 02/05/2006    Years since quitting: 16.6   Smokeless tobacco: Never  Vaping Use   Vaping Use: Never used  Substance and Sexual Activity   Alcohol use: Not Currently    Comment: rarely   Drug use: No   Sexual activity: Yes    Partners: Male  Other Topics Concern   Not on file  Social History Narrative   ** Merged History Encounter **       Social Determinants of Health   Financial Resource Strain: Low Risk  (10/10/2022)   Overall Financial Resource Strain (CARDIA)    Difficulty of Paying Living Expenses: Not hard at all  Food Insecurity: No Food Insecurity (10/10/2022)   Hunger Vital Sign    Worried About Running Out of Food in the Last Year: Never true    Ran Out of Food in the Last Year: Never true  Transportation Needs: No Transportation Needs (10/10/2022)   PRAPARE - Hydrologist (Medical): No    Lack of Transportation (Non-Medical): No  Physical Activity: Inactive (10/10/2022)   Exercise Vital Sign    Days of Exercise per Week: 0 days    Minutes of Exercise per Session: 0 min  Stress: No Stress Concern Present (10/10/2022)   [place]    Feeling of Stress : Not at all  Social Connections: Not on file    Tobacco Counseling Counseling given: Not Answered   Clinical Intake:  Pre-visit  preparation completed: Yes  Pain : No/denies pain     Nutritional Status: BMI > 30  Obese Nutritional Risks: Nausea/ vomitting/ diarrhea (past week got immodium) Diabetes: No  How often do you need to have someone help you when you read instructions, pamphlets, or other written materials from your doctor or pharmacy?: 1 - Never  Diabetic? no  Interpreter Needed?: No  Information entered by :: NAllen LPN   Activities of Daily Living    10/10/2022  2:22 PM  In your present state of health, do you have any difficulty performing the following activities:  Hearing? 0  Vision? 0  Difficulty concentrating or making decisions? 0  Walking or climbing stairs? 0  Dressing or bathing? 0  Doing errands, shopping? 0  Preparing Food and eating ? N  Using the Toilet? N  In the past six months, have you accidently leaked urine? N  Do you have problems with loss of bowel control? N  Managing your Medications? N  Managing your Finances? N  Housekeeping or managing your Housekeeping? N    Patient Care Team: Ladell Pier, MD as PCP - General (Internal Medicine)  Indicate any recent Medical Services you may have received from other than [facility] providers in the past year (date may be approximate).     Assessment:   This is a routine wellness examination for [place].  Hearing/Vision screen Vision Screening - Comments:: Regular eye exams, [facility]  Dietary issues and exercise activities discussed: Current Exercise Habits: The patient does not participate in regular exercise at present   Goals Addressed             This Visit's Progress    Patient Stated       10/10/2022, wants to lose weight       Depression Screen    10/10/2022    2:22 PM 06/29/2022    3:39 PM 12/11/2019    3:03 PM 11/27/2019    8:51 AM 10/25/2019    1:20 PM 12/15/2017    9:41 AM 03/27/2017    1:41 PM  PHQ 2/9 Scores  PHQ - 2 Score 0 0 0 0 0 0 0    Fall Risk    10/10/2022    2:22 PM 07/20/2022    10:58 AM 06/29/2022    3:39 PM 03/29/2021    1:52 PM 12/11/2019    3:03 PM  [place] in the past year? 0 0 0 0 0  Number falls in past yr: 0 0 0 0 0  Injury with Fall? 0 0 0 0 0  Risk for fall due to : Medication side effect No Fall Risks No Fall Risks    Follow up Falls prevention discussed;Education provided;Falls evaluation completed    Education provided;Falls prevention discussed    FALL RISK PREVENTION PERTAINING TO THE HOME:  Any stairs in or around the home? No  If so, are there any without handrails?  N/a Home free of loose throw rugs in walkways, pet beds, electrical cords, etc? Yes  Adequate lighting in your home to reduce risk of falls? Yes   ASSISTIVE DEVICES UTILIZED TO PREVENT FALLS:  Life alert? No  Use of a cane, walker or w/c? No  Grab bars in the bathroom? No  Shower chair or bench in shower? No  Elevated toilet seat or a handicapped toilet? No   TIMED UP AND GO:  Was the test performed? No .       Cognitive Function:        10/10/2022    2:23 PM  6CIT Screen  What Year? 0 points  What month? 0 points  What time? 0 points  Count back from 20 0 points  Months in reverse 0 points  Repeat phrase 4 points  Total Score 4 points    Immunizations Immunization History  Administered Date(s) Administered   Influenza,inj,Quad PF,6+ Mos 07/12/2016, 07/11/2017, 07/13/2018   Influenza,inj,quad, With Preservative 07/30/2020   Influenza-Unspecified 07/12/2016, 07/11/2017, 07/13/2018,  08/05/2019, 07/30/2020   PFIZER Comirnaty(Gray Top)Covid-19 Tri-Sucrose Vaccine 03/21/2020, 04/13/2020, 11/11/2020   PFIZER(Purple Top)SARS-COV-2 Vaccination 03/21/2020, 04/10/2020   Pneumococcal Polysaccharide-23 01/21/2020   Tdap 11/07/2010, 10/17/2014   Zoster Recombinat (Shingrix) 01/27/2021    TDAP status: Up to date  Flu Vaccine status: Up to date  Pneumococcal vaccine status: n/a  Covid-19 vaccine status: Completed vaccines  Qualifies for Shingles  Vaccine? Yes   Zostavax completed No   Shingrix Completed?: Yes  Screening Tests Health Maintenance  Topic Date Due   Zoster Vaccines- Shingrix (2 of 2) 03/24/2021   Medicare Annual Wellness (AWV)  06/30/2021   INFLUENZA VACCINE  06/07/2022   COVID-19 Vaccine (6 - 2023-24 season) 07/08/2022   PAP SMEAR-Modifier  10/22/2022   MAMMOGRAM  09/06/2024   DTaP/Tdap/Td (3 - Td or Tdap) 10/17/2024   COLONOSCOPY (Pts 45-39yr Insurance coverage will need to be confirmed)  03/19/2025   Hepatitis C Screening  Completed   HIV Screening  Completed   HPV VACCINES  Aged Out    Health Maintenance  Health Maintenance Due  Topic Date Due   Zoster Vaccines- Shingrix (2 of 2) 03/24/2021   Medicare Annual Wellness (AWV)  06/30/2021   INFLUENZA VACCINE  06/07/2022   COVID-19 Vaccine (6 - 2023-24 season) 07/08/2022   PAP SMEAR-Modifier  10/22/2022    Colorectal cancer screening: Type of screening: Colonoscopy. Completed 03/20/2015. Repeat every 10 years  Mammogram status: Completed 09/06/2022. Repeat every year  Bone Density status: Completed 01/2022.   Lung Cancer Screening: (Low Dose CT Chest recommended if Age 59-80years, 30 pack-year currently smoking OR have quit w/in 15years.) does not qualify.   Lung Cancer Screening Referral: no  Additional Screening:  Hepatitis C Screening: does qualify; Completed 07/13/2018  Vision Screening: Recommended annual ophthalmology exams for early detection of glaucoma and other disorders of the eye. Is the patient up to date with their annual eye exam?  Yes  Who is the provider or what is the name of the office in which the patient attends annual eye exams? [facility] If pt is not established with a provider, would they like to be referred to a provider to establish care? No .   Dental Screening: Recommended annual dental exams for proper oral hygiene  Community Resource Referral / Chronic Care Management: CRR required this visit?  No   CCM required this  visit?  No      Plan:     I have personally reviewed and noted the following in the patient's chart:   Medical and social history Use of alcohol, tobacco or illicit drugs  Current medications and supplements including opioid prescriptions. Patient is not currently taking opioid prescriptions. Functional ability and status Nutritional status Physical activity Advanced directives List of other physicians Hospitalizations, surgeries, and ER visits in previous 12 months Vitals Screenings to include cognitive, depression, and falls Referrals and appointments  In addition, I have reviewed and discussed with patient certain preventive protocols, quality metrics, and best practice recommendations. A written personalized care plan for preventive services as well as general preventive health recommendations were provided to patient.     NKellie Simmering LPN   [id number]  Nurse Notes: none  Due to this being a virtual visit, the after visit summary with patients personalized plan was offered to patient via mail or my-chart. to pick up at office at next visit

## 2022-10-12 ENCOUNTER — Ambulatory Visit
Admission: EM | Admit: 2022-10-12 | Discharge: 2022-10-12 | Disposition: A | Discharge status: Home / Self Care | Payer: Medicare Other

## 2022-10-12 DIAGNOSIS — J302 Other seasonal allergic rhinitis: Secondary | ICD-10-CM

## 2022-10-12 NOTE — Discharge Instructions (Signed)
Continue with OTC medicine including daily allergy medicine and nasal spray. Can try Sudafed as well to help with congestion. Follow-up with PCP if no improvement in a week or two.

## 2022-10-12 NOTE — ED Triage Notes (Signed)
Pt presents to Urgent Care with c/o headache, nasal congestion, and intermittent abdominal pain (more on right side) w/ diarrhea x approx 1 week.

## 2022-10-12 NOTE — ED Provider Notes (Signed)
[facility]    CSN: [id number] Arrival date & time: 10/12/22  1616      History   Chief Complaint Chief Complaint  Patient presents with   Headache   Nasal Congestion   Ear Fullness   Abdominal Pain    HPI Janet Richards is a 61 y.o. female.   Patient presents with concerns of headache, nasal congestion, scratchy throat, and some upper abdominal discomfort and diarrhea for the past week. She states she has had allergy symptoms due to mold that was in her apartment. She has been taking Zyretc for it. She denies fever, body aches, nausea/vomiting.   The history is provided by the patient.  Headache Associated symptoms: abdominal pain, congestion, diarrhea and sore throat   Associated symptoms: no cough, no dizziness, no ear pain, no fatigue, no fever, no myalgias, no nausea and no vomiting   Ear Fullness Associated symptoms include abdominal pain and headaches. Pertinent negatives include no shortness of breath.  Abdominal Pain Associated symptoms: diarrhea and sore throat   Associated symptoms: no cough, no dysuria, no fatigue, no fever, no nausea, no shortness of breath and no vomiting     Past Medical History:  Diagnosis Date   Allergy    Asthma    Chronic pancreatitis ([place])    Essential hypertension, benign    Menstrual migraine    Periodontitis    Prediabetes 10/17/2014   Primary osteoarthritis of both knees    Sinusitis    Urticaria     Patient Active Problem List   Diagnosis Date Noted   Asthma 08/18/2022   Contact dermatitis 08/18/2022   Dermatitis due to drugs and medicines taken internally 08/18/2022   Eczema 08/18/2022   Lateral epicondylitis of elbow 08/18/2022   Low back pain 08/18/2022   Overweight 08/18/2022   History of tubal ligation 09/17/2021   Osteoarthritis of knee 09/17/2021   Vaccine counseling 04/09/2020   Palpitations 03/09/2020   Irritation of ear, bilateral 03/09/2020   Generalized headaches 02/18/2020   GERD  (gastroesophageal reflux disease) 02/18/2020   Class 1 obesity due to excess calories with serious comorbidity and body mass index (BMI) of 33.0 to 33.9 in adult 01/19/2020   Dysuria 09/16/2019   Chronic pancreatitis ([place]) 04/10/2019   Environmental allergies 09/12/2018   Mild intermittent asthma without complication [id number]/8502   Dyspepsia 07/31/2018   Sensory neuronopathy 07/29/2018   Post-menopausal bleeding 05/21/2018   DDD (degenerative disc disease), cervical 02/22/2018   Periodontitis 12/15/2017   Vaginal dryness, menopausal 03/01/2017   Abnormal ankle brachial index (ABI) 11/09/2016   Numbness and tingling of both feet 09/02/2016   Ganglion cyst of right foot 07/12/2016   History of colonic polyps 03/20/2015   Hyperlipidemia 02/20/2015   Other allergic rhinitis 01/14/2015   Prediabetes 10/17/2014   Osteoarthritis of first metatarsophalangeal joint 04/11/2014   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 Authorizing Provider  cetirizine (ZYRTEC ALLERGY) 10 MG tablet Take 1 tablet (10 mg total) by mouth daily. 06/29/22   Argentina Donovan, PA-C  gabapentin (NEURONTIN) 100 MG capsule Take one tablet po q am, can increase to one tablet bid after 3 days if tolerating, can increase to TID if tolerating after another 3 days. Patient not taking: Reported on 10/10/2022 08/18/22   Salvadore Dom, MD  hydrochlorothiazide (HYDRODIURIL) 25 MG tablet Take one tablet (25 mg dose) by mouth daily. 09/08/22   Ladell Pier, MD  ibuprofen (ADVIL) 600 MG tablet Take 1 tablet (600 mg total) by mouth every 6 (six) hours as needed. 09/09/22   Wilnette Kales, PA  meloxicam (MOBIC) 7.5 MG tablet Take 1 tablet (7.5 mg total) by mouth daily. Patient not taking: Reported on 10/10/2022 04/18/22   Trula Slade, DPM  Multiple Vitamins-Minerals (MULTIVITAMIN ADULT PO) Take by mouth.    [provider]  Nutritional Supplements (IMMUNOCAL PO) Take by mouth.    [provider]  predniSONE (STERAPRED UNI-PAK 21 TAB) 10 MG (21) TBPK tablet Take by mouth daily. Take 6 tabs by mouth daily  for 2 days, then 5 tabs for 2 days, then 4 tabs for 2 days, then 3 tabs for 2 days, 2 tabs for 2 days, then 1 tab by mouth daily for 2 days Patient not taking: Reported on 10/10/2022 09/09/22   Wilnette Kales, PA    Family History Family History  Problem Relation Age of Onset   Arthritis Mother    Hypertension Mother    CAD Father        MI in his 13s   Allergic rhinitis Father    Urticaria Sister    Colon cancer Sister    Allergic rhinitis Son    Asthma Son    Angioedema Neg Hx    Eczema Neg Hx    Immunodeficiency Neg Hx    Esophageal cancer Neg Hx    Stomach cancer Neg Hx    Rectal cancer Neg Hx     Social History Social History   Tobacco Use   Smoking status: Former    Packs/day: 1.50    Types: Cigarettes    Quit date: 02/05/2006    Years since quitting: 16.6   Smokeless tobacco: Never  Vaping Use   Vaping Use: Never used  Substance Use Topics   Alcohol use: Not Currently    Comment: rarely   Drug use: No     Allergies   Azithromycin, Clindamycin/lincomycin, and Erythromycin   Review of Systems Review of Systems  Constitutional:  Negative for fatigue and fever.  HENT:  Positive for congestion, sneezing and sore throat. Negative for ear pain.   Respiratory:  Negative for cough and shortness of breath.   Gastrointestinal:  Positive for abdominal pain and diarrhea. Negative for  nausea and vomiting.  Genitourinary:  Negative for dysuria.  Musculoskeletal:  Negative for myalgias.  Skin:  Negative for rash.  Neurological:  Positive for headaches. Negative for dizziness.     Physical Exam Triage Vital Signs ED Triage Vitals  Enc Vitals Group     BP 10/12/22 1644 120/82     Pulse Rate 10/12/22 1644 90     Resp 10/12/22 1644 20     Temp 10/12/22 1643 98.8 F (37.1 C)     Temp Source 10/12/22 1643 Oral  SpO2 10/12/22 1644 97 %     Weight 10/12/22 1638 187 lb (84.8 kg)     Height 10/12/22 1638 '5\' 2"'$  (1.575 m)     Head Circumference --      Peak Flow --      Pain Score 10/12/22 1637 7     Pain Loc --      Pain Edu? --      Excl. in [place]? --    No data found.  Updated Vital Signs BP 120/82 (BP Location: Left Arm)   Pulse 90   Temp 98.8 F (37.1 C) (Oral)   Resp 20   Ht '5\' 2"'$  (1.575 m)   Wt 187 lb (84.8 kg)   LMP 09/11/2017 (Approximate)   SpO2 97%   BMI 34.20 kg/m   Visual Acuity Right Eye Distance:   Left Eye Distance:   Bilateral Distance:    Right Eye Near:   Left Eye Near:    Bilateral Near:     Physical Exam Vitals and nursing note reviewed.  Constitutional:      General: She is not in acute distress. HENT:     Head: Normocephalic.     Nose: Congestion present. No rhinorrhea.     Mouth/Throat:     Mouth: Mucous membranes are moist.     Pharynx: Oropharynx is clear. No oropharyngeal exudate or posterior oropharyngeal erythema.  Eyes:     Conjunctiva/sclera: Conjunctivae normal.     Pupils: Pupils are equal, round, and reactive to light.  Cardiovascular:     Rate and Rhythm: Normal rate and regular rhythm.     Heart sounds: Normal heart sounds.  Pulmonary:     Effort: Pulmonary effort is normal.     Breath sounds: Normal breath sounds.  Abdominal:     Palpations: Abdomen is soft.     Tenderness: There is no abdominal tenderness.  Musculoskeletal:     Cervical back: Normal range of motion.  Lymphadenopathy:     Cervical:  No cervical adenopathy.  Skin:    Findings: No rash.  Neurological:     Mental Status: She is alert.      UC Treatments / Results  Labs (all labs ordered are listed, but only abnormal results are displayed) Labs Reviewed - No data to display  EKG   Radiology No results found.  Procedures Procedures (including critical care time)  Medications Ordered in UC Medications - No data to display  Initial Impression / Assessment and Plan / UC Course  I have reviewed the triage vital signs and the nursing notes.  Pertinent labs & imaging results that were available during my care of the patient were reviewed by me and considered in my medical decision making (see chart for details).     Consistent with seasonal allergies - sx not of duration to indicate abx for sinusitis. Abdominal discomfort and diarrhea may be secondary to drainage or viral infection. Discussed sx tx and follow-up precautions.   E/M: 1 acute uncomplicated illness, no data, low risk   Final Clinical Impressions(s) / UC Diagnoses   Final diagnoses:  Seasonal allergies     Discharge Instructions      Continue with OTC medicine including daily allergy medicine and nasal spray. Can try Sudafed as well to help with congestion. Follow-up with PCP if no improvement in a week or two.    ED Prescriptions   None    PDMP not reviewed this encounter.   Delsa Sale, [place] 10/12/22 1744

## 2022-11-21 ENCOUNTER — Emergency Department (HOSPITAL_BASED_OUTPATIENT_CLINIC_OR_DEPARTMENT_OTHER): Payer: Medicare Other

## 2022-11-21 ENCOUNTER — Other Ambulatory Visit: Payer: Self-pay

## 2022-11-21 ENCOUNTER — Encounter (HOSPITAL_BASED_OUTPATIENT_CLINIC_OR_DEPARTMENT_OTHER): Payer: Self-pay | Admitting: Emergency Medicine

## 2022-11-21 DIAGNOSIS — I10 Essential (primary) hypertension: Secondary | ICD-10-CM | POA: Insufficient documentation

## 2022-11-21 DIAGNOSIS — R072 Precordial pain: Secondary | ICD-10-CM | POA: Insufficient documentation

## 2022-11-21 DIAGNOSIS — J45909 Unspecified asthma, uncomplicated: Secondary | ICD-10-CM | POA: Insufficient documentation

## 2022-11-21 DIAGNOSIS — Z79899 Other long term (current) drug therapy: Secondary | ICD-10-CM | POA: Insufficient documentation

## 2022-11-21 DIAGNOSIS — R0789 Other chest pain: Secondary | ICD-10-CM | POA: Diagnosis not present

## 2022-11-21 DIAGNOSIS — Z87891 Personal history of nicotine dependence: Secondary | ICD-10-CM | POA: Insufficient documentation

## 2022-11-21 LAB — CBC
HCT: 37.5 % (ref 36.0–46.0)
Hemoglobin: 12.5 g/dL (ref 12.0–15.0)
MCH: 30.1 pg (ref 26.0–34.0)
MCHC: 33.3 g/dL (ref 30.0–36.0)
MCV: 90.4 fL (ref 80.0–100.0)
Platelets: 370 10*3/uL (ref 150–400)
RBC: 4.15 MIL/uL (ref 3.87–5.11)
RDW: 14.2 % (ref 11.5–15.5)
WBC: 8.4 10*3/uL (ref 4.0–10.5)
nRBC: 0 % (ref 0.0–0.2)

## 2022-11-21 NOTE — ED Triage Notes (Signed)
Pt here for L sharp chest pain w/ HA and HTN, home bp 194/119. Pt took '324mg'$  ASA and a dose of HCTZ prior to coming in. Pt reports she doesn't take it unless her BP is high at home. Pt reports taking black seed oil earlier today, reports she has been taking it x3 days.

## 2022-11-22 ENCOUNTER — Emergency Department (HOSPITAL_BASED_OUTPATIENT_CLINIC_OR_DEPARTMENT_OTHER)
Admission: EM | Admit: 2022-11-22 | Discharge: 2022-11-22 | Disposition: A | Discharge status: Home / Self Care | Payer: Medicare Other | Attending: Emergency Medicine | Admitting: Emergency Medicine

## 2022-11-22 DIAGNOSIS — R0789 Other chest pain: Secondary | ICD-10-CM

## 2022-11-22 DIAGNOSIS — R072 Precordial pain: Secondary | ICD-10-CM | POA: Diagnosis not present

## 2022-11-22 LAB — BASIC METABOLIC PANEL
Anion gap: 10 (ref 5–15)
BUN: 15 mg/dL (ref 6–20)
CO2: 26 mmol/L (ref 22–32)
Calcium: 9.3 mg/dL (ref 8.9–10.3)
Chloride: 97 mmol/L — ABNORMAL LOW (ref 98–111)
Creatinine, Ser: 0.97 mg/dL (ref 0.44–1.00)
GFR, Estimated: >60 mL/min (ref >60)
Glucose, Bld: 102 mg/dL — ABNORMAL HIGH (ref 70–99)
Potassium: 3.2 mmol/L — ABNORMAL LOW (ref 3.5–5.1)
Sodium: 133 mmol/L — ABNORMAL LOW (ref 135–145)

## 2022-11-22 LAB — TROPONIN I (HIGH SENSITIVITY)
Troponin I (High Sensitivity): 7 ng/L (ref <18)
Troponin I (High Sensitivity): 7 ng/L (ref <18)

## 2022-11-22 MED ORDER — KETOROLAC TROMETHAMINE 15 MG/ML IJ SOLN
15.0000 mg | Freq: Once | INTRAMUSCULAR | Status: AC
  Administered 2022-11-22: 15 mg via INTRAMUSCULAR
  Filled 2022-11-22: qty 1

## 2022-11-22 MED ORDER — POTASSIUM CHLORIDE CRYS ER 20 MEQ PO TBCR
20.0000 meq | EXTENDED_RELEASE_TABLET | Freq: Once | ORAL | Status: AC
  Administered 2022-11-22: 20 meq via ORAL
  Filled 2022-11-22: qty 1

## 2022-11-22 NOTE — ED Notes (Signed)
Provider was in discussing results.  RN went to room w/ discharge papers however pt and visitor were already gone.

## 2022-11-22 NOTE — ED Notes (Signed)
Pt reports she began taking "black seed oil" and stopped taking her high blood pressure medicine X3 days ago.  "Black seed oil is suppose to help everything, high blood pressure, inflammation and diabetes (I'm not diabetic).  Pt states her blood pressure went up so she took her blood pressure medicine yesterday and 324 ASA as well b/c her chest was hurting.  She stated that she read you are not suppose to take those medicines w/ black seed oil.

## 2022-11-22 NOTE — ED Provider Notes (Signed)
[facility] Provider Note: Georgena Spurling, MD, FACEP  CSN: [id number] MRN: [id number] ARRIVAL: 11/21/22 at 2332 ROOM: [place]  Chest Pain   HISTORY OF PRESENT ILLNESS  11/22/22 4:13 AM AMIRIA ORRISON is a 62 y.o. female with chest pain that began yesterday evening about 8 PM.  The chest pain is located just to the left of her lower sternum.  It is well localized.  She rates it as a 7 out of 10 and it is worse with movement particularly movement of her left arm or movements that affect the sternum.  It is not worse with palpation.  She is not currently short of breath or nauseated.  She has a history of hypertension and started taking black seed oil (AKA black Car[place], Nigella sativa) as a dietary supplement 3 days ago.   Past Medical History:  Diagnosis Date   Allergy    Asthma    Chronic pancreatitis ([place])    Essential hypertension, benign    Menstrual migraine    Periodontitis    Prediabetes 10/17/2014   Primary osteoarthritis of both knees    Sinusitis    Urticaria     Past Surgical History:  Procedure Laterality Date   TUBAL LIGATION  12/26/1987    Family History  Problem Relation Age of Onset   Arthritis Mother    Hypertension Mother    CAD Father        MI in his 21s   Allergic rhinitis Father    Urticaria Sister    Colon cancer Sister    Allergic rhinitis Son    Asthma Son    Angioedema Neg Hx    Eczema Neg Hx    Immunodeficiency Neg Hx    Esophageal cancer Neg Hx    Stomach cancer Neg Hx    Rectal cancer Neg Hx     Social History   Tobacco Use   Smoking status: Former    Packs/day: 1.50    Types: Cigarettes    Quit date: 02/05/2006    Years since quitting: 16.8   Smokeless tobacco: Never  Vaping Use   Vaping Use: Never used  Substance Use Topics   Alcohol use: Not Currently    Comment: rarely   Drug use: No    Prior to Admission medications   Medication Sig Start Date End Date Taking? Authorizing  Provider  cetirizine (ZYRTEC ALLERGY) 10 MG tablet Take 1 tablet (10 mg total) by mouth daily. 06/29/22   Argentina Donovan, PA-C  hydrochlorothiazide (HYDRODIURIL) 25 MG tablet Take one tablet (25 mg dose) by mouth daily. 09/08/22   Ladell Pier, MD  ibuprofen (ADVIL) 600 MG tablet Take 1 tablet (600 mg total) by mouth every 6 (six) hours as needed. 09/09/22   Wilnette Kales, PA  Multiple Vitamins-Minerals (MULTIVITAMIN ADULT PO) Take by mouth.    [provider]  Nutritional Supplements (IMMUNOCAL PO) Take by mouth.    [provider]    Allergies Azithromycin, Clindamycin/lincomycin, and Erythromycin   REVIEW OF SYSTEMS  Negative except as noted here or in the History of Present Illness.   PHYSICAL EXAMINATION  Initial Vital Signs Blood pressure (!) 183/100, pulse 86, temperature 98.7 F (37.1 C), temperature source Oral, resp. rate 17, height '5\' 3"'$  (1.6 m), weight 84.8 kg, last menstrual period 09/11/2017, SpO2 100 %.  Examination General: Well-developed, well-nourished female in no acute distress; appearance consistent with age of record HENT: normocephalic; atraumatic Eyes: Normal  appearance Neck: supple Heart: regular rate and rhythm Lungs: clear to auscultation bilaterally Chest: Nontender but certain movements reproduce the parasternal pain Abdomen: soft; nondistended; nontender; bowel sounds present Extremities: No deformity; full range of motion; pulses normal Neurologic: Awake, alert and oriented; motor function intact in all extremities and symmetric; no facial droop Skin: Warm and dry Psychiatric: Normal mood and affect   RESULTS  Summary of this visit's results, reviewed and interpreted by myself:   EKG Interpretation  Date/Time:    Ventricular Rate:    PR Interval:    QRS Duration:   QT Interval:    QTC Calculation:   R Axis:     Text Interpretation:         Laboratory Studies: Results for orders placed or performed during  the hospital encounter of 11/22/22 (from the past 24 hour(s))  Basic metabolic panel     Status: Abnormal   Collection Time: 11/21/22 11:51 PM  Result Value Ref Range   Sodium 133 (L) 135 - 145 mmol/L   Potassium 3.2 (L) 3.5 - 5.1 mmol/L   Chloride 97 (L) 98 - 111 mmol/L   CO2 26 22 - 32 mmol/L   Glucose, Bld 102 (H) 70 - 99 mg/dL   BUN 15 6 - 20 mg/dL   Creatinine, Ser 0.97 0.44 - 1.00 mg/dL   Calcium 9.3 8.9 - 10.3 mg/dL   GFR, Estimated >60 >60 mL/min   Anion gap 10 5 - 15  CBC     Status: None   Collection Time: 11/21/22 11:51 PM  Result Value Ref Range   WBC 8.4 4.0 - 10.5 K/uL   RBC 4.15 3.87 - 5.11 MIL/uL   Hemoglobin 12.5 12.0 - 15.0 g/dL   HCT 37.5 36.0 - 46.0 %   MCV 90.4 80.0 - 100.0 fL   MCH 30.1 26.0 - 34.0 pg   MCHC 33.3 30.0 - 36.0 g/dL   RDW 14.2 11.5 - 15.5 %   Platelets 370 150 - 400 K/uL   nRBC 0.0 0.0 - 0.2 %  Troponin I (High Sensitivity)     Status: None   Collection Time: 11/21/22 11:51 PM  Result Value Ref Range   Troponin I (High Sensitivity) 7 <18 ng/L  Troponin I (High Sensitivity)     Status: None   Collection Time: 11/22/22  4:31 AM  Result Value Ref Range   Troponin I (High Sensitivity) 7 <18 ng/L   Imaging Studies: DG Chest 2 View  Result Date: 11/22/2022 CLINICAL DATA:  Chest pain EXAM: CHEST - 2 VIEW COMPARISON:  09/09/2022 FINDINGS: The heart size and mediastinal contours are within normal limits. Both lungs are clear. The visualized skeletal structures are unremarkable. IMPRESSION: No active cardiopulmonary disease. Electronically Signed   By: Rolm Baptise M.D.   On: 11/22/2022 00:09    ED COURSE and MDM  Nursing notes, initial and subsequent vitals signs, including pulse oximetry, reviewed and interpreted by myself.  Vitals:   11/21/22 2348 11/22/22 0348 11/22/22 0400 11/22/22 0415  BP:  (!) 183/100 (!) 172/92 (!) 173/97  Pulse:  86    Resp:  '17 16 15  '$ Temp:  98.7 F (37.1 C)    TempSrc:  Oral    SpO2:  100%    Weight: 84.8 kg      Height: '5\' 3"'$  (1.6 m)      Medications  potassium chloride SA (KLOR-CON M) CR tablet 20 mEq (20 mEq Oral Given 11/22/22 0436)  ketorolac (TORADOL) 15 MG/ML  injection 15 mg (15 mg Intramuscular Given 11/22/22 0436)   5:05 AM The patient's EKG, which did not crossover into this document, is nonischemic.  Her troponins are both 7 which are well within normal limits and there was no increase between them.  Her pain is reproducible and has improved after Toradol so I suspect it represents some inflammatory musculoskeletal process.  She was advised to take over-the-counter NSAIDs as needed for this.   PROCEDURES  Procedures   ED DIAGNOSES     ICD-10-CM   1. Chest wall pain  R07.89          Shanon Rosser, MD 11/22/22 [phone]

## 2022-11-30 ENCOUNTER — Ambulatory Visit: Payer: Self-pay | Admitting: *Deleted

## 2022-11-30 ENCOUNTER — Ambulatory Visit
Admission: EM | Admit: 2022-11-30 | Discharge: 2022-11-30 | Disposition: A | Discharge status: Home / Self Care | Attending: Urgent Care | Admitting: Urgent Care

## 2022-11-30 DIAGNOSIS — I10 Essential (primary) hypertension: Secondary | ICD-10-CM

## 2022-11-30 DIAGNOSIS — J018 Other acute sinusitis: Secondary | ICD-10-CM

## 2022-11-30 MED ORDER — CEFDINIR 300 MG PO CAPS: 300.0000 mg | ORAL_CAPSULE | Freq: Two times a day (BID) | ORAL | 0 refills | Status: DC

## 2022-11-30 NOTE — Telephone Encounter (Signed)
Reason for Disposition  Systolic BP  >= 470 OR Diastolic >= 929  Answer Assessment - Initial Assessment Questions 1. BLOOD PRESSURE: "What is the blood pressure?" "Did you take at least two measurements 5 minutes apart?"     164/87, 124/74, 195/90 P 89 2. ONSET: "When did you take your blood pressure?"     Early am, after morning meds, 8:46 3. HOW: "How did you take your blood pressure?" (e.g., automatic home BP monitor, visiting nurse)     Automatic cuff 4. HISTORY: "Do you have a history of high blood pressure?"     Yes- patient has had fluctuating BP- seen at ED for symptoms 5. MEDICINES: "Are you taking any medicines for blood pressure?" "Have you missed any doses recently?"     yes 6. OTHER SYMPTOMS: "Do you have any symptoms?" (e.g., blurred vision, chest pain, difficulty breathing, headache, weakness)     Patient thinks she has infection- sinus  Protocols used: Blood Pressure - High-A-AH

## 2022-11-30 NOTE — ED Provider Notes (Signed)
[facility]  Note:  This document was prepared using Systems analyst and may include unintentional dictation errors.  MRN: [id number] DOB: 10-17-1961  Subjective:   [facility] is a 62 y.o. female presenting for 2-week history of bilateral sinus pressure, sinus pain, gum and tooth pain, bilateral ear pain.  Has also been coughing.  Has a history of sinus infections.  Would like to avoid amoxicillin that she has taken this many times for her sinuses.  She prefers a cephalosporin.  She has Zyrtec she can take at home.  No chest pain, shortness of breath or wheezing.  Regarding her blood pressure, she had been out of her medication for the past 2 days and just picked it up today.  No current facility-administered medications for this encounter.  Current Outpatient Medications:    cetirizine (ZYRTEC ALLERGY) 10 MG tablet, Take 1 tablet (10 mg total) by mouth daily., Disp: 90 tablet, Rfl: 1   hydrochlorothiazide (HYDRODIURIL) 25 MG tablet, Take one tablet (25 mg dose) by mouth daily., Disp: 90 tablet, Rfl: 1   ibuprofen (ADVIL) 600 MG tablet, Take 1 tablet (600 mg total) by mouth every 6 (six) hours as needed., Disp: 30 tablet, Rfl: 0   Multiple Vitamins-Minerals (MULTIVITAMIN ADULT PO), Take by mouth., Disp: , Rfl:    Nutritional Supplements (IMMUNOCAL PO), Take by mouth., Disp: , Rfl:    Allergies  Allergen Reactions   Azithromycin Nausea And Vomiting   Clindamycin/Lincomycin Hives    Facial swelling   Erythromycin Hives and Nausea And Vomiting    Has taken azithromycin with no issues in the past.    Past Medical History:  Diagnosis Date   Allergy    Asthma    Chronic pancreatitis ([place])    Essential hypertension, benign    Menstrual migraine    Periodontitis    Prediabetes 10/17/2014   Primary osteoarthritis of both knees    Sinusitis    Urticaria      Past Surgical History:  Procedure Laterality Date   TUBAL LIGATION   12/26/1987    Family History  Problem Relation Age of Onset   Arthritis Mother    Hypertension Mother    CAD Father        MI in his 53s   Allergic rhinitis Father    Urticaria Sister    Colon cancer Sister    Allergic rhinitis Son    Asthma Son    Angioedema Neg Hx    Eczema Neg Hx    Immunodeficiency Neg Hx    Esophageal cancer Neg Hx    Stomach cancer Neg Hx    Rectal cancer Neg Hx     Social History   Tobacco Use   Smoking status: Former    Packs/day: 1.50    Types: Cigarettes    Quit date: 02/05/2006    Years since quitting: 16.8   Smokeless tobacco: Never  Vaping Use   Vaping Use: Never used  Substance Use Topics   Alcohol use: Not Currently    Comment: rarely   Drug use: No    ROS   Objective:   Vitals: BP (!) 180/96 (BP Location: Right Arm)   Pulse 88   Temp 99.1 F (37.3 C) (Oral)   Resp 18   LMP 09/11/2017 (Approximate)   SpO2 97%   BP Readings from Last 3 Encounters:  11/30/22 (!) 180/96  11/22/22 (!) 173/97  10/12/22 120/82   Physical Exam Constitutional:  General: She is not in acute distress.    Appearance: Normal appearance. She is well-developed and normal weight. She is not ill-appearing, toxic-appearing or diaphoretic.  HENT:     Head: Normocephalic and atraumatic.     Right Ear: Tympanic membrane, ear canal and external ear normal. No drainage or tenderness. No middle ear effusion. There is no impacted cerumen. Tympanic membrane is not erythematous or bulging.     Left Ear: Tympanic membrane, ear canal and external ear normal. No drainage or tenderness.  No middle ear effusion. There is no impacted cerumen. Tympanic membrane is not erythematous or bulging.     Nose: Congestion present. No rhinorrhea.     Mouth/Throat:     Mouth: Mucous membranes are moist. No oral lesions.     Pharynx: No pharyngeal swelling, oropharyngeal exudate, posterior oropharyngeal erythema or uvula swelling.     Tonsils: No tonsillar exudate or  tonsillar abscesses.  Eyes:     General: No scleral icterus.       Right eye: No discharge.        Left eye: No discharge.     Extraocular Movements: Extraocular movements intact.     Right eye: Normal extraocular motion.     Left eye: Normal extraocular motion.     Conjunctiva/sclera: Conjunctivae normal.  Cardiovascular:     Rate and Rhythm: Normal rate and regular rhythm.     Heart sounds: Normal heart sounds. No murmur heard.    No friction rub. No gallop.  Pulmonary:     Effort: Pulmonary effort is normal. No respiratory distress.     Breath sounds: No stridor. No wheezing, rhonchi or rales.  Chest:     Chest wall: No tenderness.  Musculoskeletal:     Cervical back: Normal range of motion and neck supple.  Lymphadenopathy:     Cervical: No cervical adenopathy.  Skin:    General: Skin is warm and dry.  Neurological:     General: No focal deficit present.     Mental Status: She is alert and oriented to person, place, and time.  Psychiatric:        Mood and Affect: Mood normal.        Behavior: Behavior normal.     Assessment and Plan :   PDMP not reviewed this encounter.  1. Other acute sinusitis, recurrence not specified   2. Essential hypertension     Will start empiric treatment for sinusitis with cefdinir as patient wanted to avoid Augmentin, amoxicillin.  Recommended supportive care otherwise. .mmnoxcr monitors her blood pressure and recheck with her PCP.  Counseled patient on potential for adverse effects with medications prescribed/recommended today, ER and return-to-clinic precautions discussed, patient verbalized understanding.    Jaynee Eagles, [place] 11/30/22 1526

## 2022-11-30 NOTE — ED Triage Notes (Signed)
Pt c/o pain to face, top and back of head, bilat ear pain x 2 weeks-states she feels she had sinus infection or gum infection-NAD-steady gait

## 2022-11-30 NOTE — Telephone Encounter (Signed)
  Chief Complaint: elevated BP Symptoms: elevated BP- fluctuating, sinus symptoms Frequency: patient was seen in ED 11/22/22- elevated BP- needs office follow up Pertinent Negatives: Patient denies chest pain, difficulty breathing Disposition: '[]'$ ED /'[]'$ Urgent Care (no appt availability in office) / '[x]'$ Appointment(In office/virtual)/ '[]'$  [facility]/ '[]'$ Home Care/ '[]'$ Refused Recommended Disposition /'[]'$ [facility]/ '[]'$  Follow-up with PCP Additional Notes: Patient may go to UC today for sinus symptoms- but she will come to office for her BP

## 2022-11-30 NOTE — Discharge Instructions (Addendum)
For diabetes or elevated blood sugar, please make sure you are limiting and avoiding starchy, carbohydrate foods like pasta, breads, sweet breads, pastry, rice, potatoes, desserts. These foods can elevate your blood sugar. Also, limit and avoid drinks that contain a lot of sugar such as sodas, sweet teas, fruit juices.  Drinking plain water will be much more helpful, try 80 ounces of water daily.  It is okay to flavor your water naturally by cutting cucumber, lemon, mint or lime, placing it in a picture with water and drinking it over a period of 24-48 hours as long as it remains refrigerated.  For elevated blood pressure, make sure you are monitoring salt in your diet.  Do not eat restaurant foods and limit processed foods at home. I highly recommend you prepare and cook your own foods at home.  Processed foods include things like frozen meals, pre-seasoned meats and dinners, deli meats, canned foods as these foods contain a high amount of sodium/salt.  Make sure you are paying attention to sodium labels on foods you buy at the grocery store. Buy your spices separately such as garlic powder, onion powder, cumin, cayenne, parsley flakes so that you can avoid seasonings that contain salt. However, salt-free seasonings are available and can be used, an example is Mrs. Dash and includes a lot of different mixtures that do not contain salt.  Lastly, when cooking using oils that are healthier for you is important. This includes olive oil, avocado oil, canola oil. We have discussed a lot of foods to avoid but below is a list of foods that can be very healthy to use in your diet whether it is for diabetes, cholesterol, high blood pressure, or in general healthy eating.  Salads - kale, spinach, cabbage, spring mix, arugula Fruits - avocadoes, berries (blueberries, raspberries, blackberries), apples, oranges, pomegranate, grapefruit, kiwi Vegetables - asparagus, cauliflower, broccoli, green beans, brussel sprouts,  bell peppers, beets; stay away from or limit starchy vegetables like potatoes, carrots, peas Other general foods - kidney beans, egg whites, almonds, walnuts, sunflower seeds, pumpkin seeds, fat free yogurt, almond milk, flax seeds, quinoa, oats  Meat - It is better to eat lean meats and limit your red meat including pork to once a week.  Wild caught fish, chicken breast are good options as they tend to be leaner sources of good protein. Still be mindful of the sodium labels for the meats you buy.  DO NOT EAT ANY FOODS ON THIS LIST THAT YOU ARE ALLERGIC TO. For more specific needs, I highly recommend consulting a dietician or nutritionist but this can definitely be a good starting point.  

## 2022-12-01 ENCOUNTER — Encounter: Payer: Self-pay | Admitting: Internal Medicine

## 2022-12-01 ENCOUNTER — Ambulatory Visit: Payer: Medicare Other | Admitting: Internal Medicine

## 2022-12-01 ENCOUNTER — Ambulatory Visit: Payer: Medicare Other | Attending: Internal Medicine | Admitting: Internal Medicine

## 2022-12-01 VITALS — BP 109/73 | HR 95 | Temp 98.3°F | Ht 63.0 in | Wt 193.0 lb

## 2022-12-01 DIAGNOSIS — Z1331 Encounter for screening for depression: Secondary | ICD-10-CM

## 2022-12-01 DIAGNOSIS — I10 Essential (primary) hypertension: Secondary | ICD-10-CM | POA: Diagnosis not present

## 2022-12-01 DIAGNOSIS — M5431 Sciatica, right side: Secondary | ICD-10-CM | POA: Diagnosis not present

## 2022-12-01 DIAGNOSIS — E669 Obesity, unspecified: Secondary | ICD-10-CM | POA: Diagnosis not present

## 2022-12-01 DIAGNOSIS — Z23 Encounter for immunization: Secondary | ICD-10-CM

## 2022-12-01 MED ORDER — HYDROCHLOROTHIAZIDE 25 MG PO TABS: ORAL_TABLET | ORAL | 1 refills | Status: DC

## 2022-12-01 NOTE — Progress Notes (Signed)
Patient ID: SWAN FAIRFAX, female    DOB: [birth year]  MRN: [id number]  CC: Hypertension (HTN f/u. Med refill./Pain on hip to thigh X2-3 weeks)   Subjective: Chancey Cullinane is a 62 y.o. female who presents for chronic ds management Her concerns today include:  Pt with hx of HTN, HL, OA knees, PreDM, GERD,asthma, OA knees.   Needs RF on HCTZ which she takes for blood pressure Reports compliance with the med No chest pains or shortness of breath.  No lower extremity edema.  Seen at [facility] yesterday for sinus.  On Omnicef and taking as prescribed.  She wonders whether the 300 mg twice a day is strong enough.  C/o pain RT lower back that radiates to lateral thigh to mid level Constant.  No initiating factors. No numbness or tingling. No incontinence Massaging  and icing helps  Obesity/PreDM:  Difficulty losing wgh Walking daily for 45-60 mins.  Also does some light wghs 2 x a wk Feels she does okay with her eating habits.  She drinks sodas occasionally.  Does drink Kool-Aid   Pos Dep Screen: reports no issue with depression and does not need any medication or counseling.  Patient Active Problem List   Diagnosis Date Noted   Asthma 08/18/2022   Contact dermatitis 08/18/2022   Dermatitis due to drugs and medicines taken internally 08/18/2022   Eczema 08/18/2022   Lateral epicondylitis of elbow 08/18/2022   Low back pain 08/18/2022   Overweight 08/18/2022   History of tubal ligation 09/17/2021   Osteoarthritis of knee 09/17/2021   Vaccine counseling 04/09/2020   Palpitations 03/09/2020   Irritation of ear, bilateral 03/09/2020   Generalized headaches 02/18/2020   GERD (gastroesophageal reflux disease) 02/18/2020   Class 1 obesity due to excess calories with serious comorbidity and body mass index (BMI) of 33.0 to 33.9 in adult 01/19/2020   Dysuria 09/16/2019   Chronic pancreatitis ([place]) 04/10/2019   Environmental allergies 09/12/2018   Mild intermittent asthma without  complication 41/66/0630   Dyspepsia 07/31/2018   Sensory neuronopathy 07/29/2018   Post-menopausal bleeding 05/21/2018   DDD (degenerative disc disease), cervical 02/22/2018   Periodontitis 12/15/2017   Vaginal dryness, menopausal 03/01/2017   Abnormal ankle brachial index (ABI) 11/09/2016   Numbness and tingling of both feet 09/02/2016   Ganglion cyst of right foot 07/12/2016   History of colonic polyps 03/20/2015   Hyperlipidemia 02/20/2015   Other allergic rhinitis 01/14/2015   Prediabetes 10/17/2014   Osteoarthritis of first metatarsophalangeal joint 04/11/2014   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/23/2013   No blood products 03/28/2013   Essential hypertension 03/01/2013   Primary osteoarthritis of both knees 12/27/2012     Current Outpatient Medications on File Prior to Visit  Medication Sig Dispense Refill   cefdinir (OMNICEF) 300 MG capsule Take 1 capsule (300 mg total) by mouth 2 (two) times daily. 20 capsule 0   cetirizine (ZYRTEC ALLERGY) 10 MG tablet Take 1 tablet (10 mg total) by mouth daily. 90 tablet 1   ibuprofen (ADVIL) 600 MG tablet Take 1 tablet (600 mg total) by mouth every 6 (six) hours as needed. 30 tablet 0   Multiple Vitamins-Minerals (MULTIVITAMIN ADULT PO) Take by mouth.     Nutritional Supplements (IMMUNOCAL PO) Take by mouth.     No current facility-administered medications on file prior to visit.    Allergies  Allergen Reactions   Azithromycin Nausea And Vomiting   Clindamycin/Lincomycin Hives    Facial swelling   Erythromycin Hives and Nausea And  Vomiting    Has taken azithromycin with no issues in the past.    Social History   Socioeconomic History   Marital status: Married    Spouse name: Not on file   Number of children: 3   Years of education: Not on file   Highest education level: Not on file  Occupational History   Occupation: homemaker  Tobacco Use   Smoking status: Former    Packs/day: 1.50    Types: Cigarettes    Quit date: 02/05/2006     Years since quitting: 16.8   Smokeless tobacco: Never  Vaping Use   Vaping Use: Never used  Substance and Sexual Activity   Alcohol use: Not Currently   Drug use: No   Sexual activity: Yes    Partners: Male  Other Topics Concern   Not on file  Social History Narrative   ** Merged History Encounter **       Social Determinants of Health   Financial Resource Strain: Low Risk  (10/10/2022)   Overall Financial Resource Strain (CARDIA)    Difficulty of Paying Living Expenses: Not hard at all  Food Insecurity: No Food Insecurity (10/10/2022)   Hunger Vital Sign    Worried About Running Out of Food in the Last Year: Never true    [place] in the Last Year: Never true  Transportation Needs: No Transportation Needs (10/10/2022)   PRAPARE - Hydrologist (Medical): No    Lack of Transportation (Non-Medical): No  Physical Activity: Inactive (10/10/2022)   Exercise Vital Sign    Days of Exercise per Week: 0 days    Minutes of Exercise per Session: 0 min  Stress: No Stress Concern Present (10/10/2022)   [place]    Feeling of Stress : Not at all  Social Connections: Not on file  Intimate Partner Violence: Not on file    Family History  Problem Relation Age of Onset   Arthritis Mother    Hypertension Mother    CAD Father        MI in his 40s   Allergic rhinitis Father    Urticaria Sister    Colon cancer Sister    Allergic rhinitis Son    Asthma Son    Angioedema Neg Hx    Eczema Neg Hx    Immunodeficiency Neg Hx    Esophageal cancer Neg Hx    Stomach cancer Neg Hx    Rectal cancer Neg Hx     Past Surgical History:  Procedure Laterality Date   TUBAL LIGATION  12/26/1987    ROS: Review of Systems Negative except as stated above  PHYSICAL EXAM: BP 109/73 (BP Location: Left Arm, Patient Position: Sitting, Cuff Size: Normal)   Pulse 95   Temp 98.3 F (36.8 C) (Oral)    Ht '5\' 3"'$  (1.6 m)   Wt 193 lb (87.5 kg)   LMP 09/11/2017 (Approximate)   SpO2 98%   BMI 34.19 kg/m   Physical Exam  General appearance - alert, well appearing, middle-aged African-American female and in no distress Mental status - normal mood, behavior, speech, dress, motor activity, and thought processes Neck - supple, no significant adenopathy Chest - clear to auscultation, no wheezes, rales or rhonchi, symmetric air entry Heart - normal rate, regular rhythm, normal S1, S2, no murmurs, rubs, clicks or gallops Extremities - peripheral pulses normal, no pedal edema, no clubbing or cyanosis MSK: Gait is  normal.  She is wearing high heels.  Straight leg raise negative on both sides.    12/01/2022    3:52 PM 10/10/2022    2:22 PM 06/29/2022    3:39 PM  Depression screen PHQ 2/9  Decreased Interest 2 0 0  Down, Depressed, Hopeless 0 0 0  PHQ - 2 Score 2 0 0  Altered sleeping 2    Tired, decreased energy 2    Change in appetite 2    Feeling bad or failure about yourself  0    Trouble concentrating 2    Moving slowly or fidgety/restless 2    Suicidal thoughts 0    PHQ-9 Score 12        12/01/2022    3:52 PM 06/29/2022    3:39 PM  GAD 7 : Generalized Anxiety Score  Nervous, Anxious, on Edge 0 0  Control/stop worrying 0 0  Worry too much - different things 2 0  Trouble relaxing 3 0  Restless 1 0  Easily annoyed or irritable 2 0  Afraid - awful might happen 0 0  Total GAD 7 Score 8 0        Latest Ref Rng & Units 11/21/2022   11:51 PM 09/09/2022    9:57 PM 04/24/2022    7:00 PM  CMP  Glucose 70 - 99 mg/dL 102  109  121   BUN 6 - 20 mg/dL '15  14  12   '$ Creatinine 0.44 - 1.00 mg/dL 0.97  0.79  0.85   Sodium 135 - 145 mmol/L 133  137  136   Potassium 3.5 - 5.1 mmol/L 3.2  3.5  3.2   Chloride 98 - 111 mmol/L 97  98  97   CO2 22 - 32 mmol/L '26  29  31   '$ Calcium 8.9 - 10.3 mg/dL 9.3  9.3  9.8   Total Protein 6.5 - 8.1 g/dL  7.6  7.4   Total Bilirubin 0.3 - 1.2 mg/dL  0.4   0.7   Alkaline Phos 38 - 126 U/L  71  62   AST 15 - 41 U/L  22  22   ALT 0 - 44 U/L  26  23    Lipid Panel     Component Value Date/Time   CHOL 205 (A) 03/17/2022 0000   TRIG 325 (A) 03/17/2022 0000   HDL 48 03/17/2022 0000   CHOLHDL 5 08/01/2019 1117   VLDL 37.0 08/01/2019 1117   LDLCALC 92 03/17/2022 0000    CBC    Component Value Date/Time   WBC 8.4 11/21/2022 2351   RBC 4.15 11/21/2022 2351   HGB 12.5 11/21/2022 2351   HCT 37.5 11/21/2022 2351   PLT 370 11/21/2022 2351   MCV 90.4 11/21/2022 2351   MCH 30.1 11/21/2022 2351   MCHC 33.3 11/21/2022 2351   RDW 14.2 11/21/2022 2351   LYMPHSABS 2.9 09/09/2022 2157   MONOABS 0.4 09/09/2022 2157   EOSABS 0.3 09/09/2022 2157   BASOSABS 0.0 09/09/2022 2157    ASSESSMENT AND PLAN: 1. Essential hypertension At goal.  Continue HCTZ and low-salt diet. - hydrochlorothiazide (HYDRODIURIL) 25 MG tablet; Take one tablet (25 mg dose) by mouth daily.  Dispense: 90 tablet; Refill: 1  2. Obesity (BMI 30.0-34.9) Patient advised to eliminate sugary drinks from the diet, cut back on portion sizes especially of white carbohydrates, eat more white lean meat like chicken [place] and seafood instead of beef or pork and incorporate fresh fruits and vegetables  into the diet daily. Encouraged her to continue regular exercise  3. Positive depression screening Patient with positive depression screen but she reports that depression is not an issue for her.  4. Sciatica of right side Recommend using some ibuprofen every 8 hours for the next several days.  5. Need for immunization against influenza - Flu Vaccine QUAD 39moIM (Fluarix, Fluzone & Alfiuria Quad PF)    Patient was given the opportunity to ask questions.  Patient verbalized understanding of the plan and was able to repeat key elements of the plan.   This documentation was completed using DRadio producer  Any transcriptional errors are unintentional.  Orders  Placed This Encounter  Procedures   Flu Vaccine QUAD 674moM (Fluarix, Fluzone & Alfiuria Quad PF)     Requested Prescriptions   Signed Prescriptions Disp Refills   hydrochlorothiazide (HYDRODIURIL) 25 MG tablet 90 tablet 1    Sig: Take one tablet (25 mg dose) by mouth daily.    Return in about 4 months (around 04/01/2023).  DeKarle PlumberMD, FACP

## 2022-12-05 ENCOUNTER — Other Ambulatory Visit: Payer: Self-pay | Admitting: Physician Assistant

## 2022-12-05 DIAGNOSIS — J3089 Other allergic rhinitis: Secondary | ICD-10-CM

## 2022-12-20 ENCOUNTER — Ambulatory Visit: Payer: Self-pay

## 2022-12-20 NOTE — Telephone Encounter (Signed)
Summary: pt wanting another round of medication   Pt states that she is still not felling any better, and she is wanting another dose of cefdinir (OMNICEF) 300 MG capsule and diflucan so that she does not get a yeast infection.  Please advise      Called pt - left message on machine to call back.

## 2022-12-20 NOTE — Telephone Encounter (Signed)
  Chief Complaint: requesting additional medication cefdinir (OMNICEF) 300 mg and diflucan due to symptoms not completely gone for sinus infection Symptoms: pain remains in ears, top of nose and eyebrows. Requesting diflucan to prevent yeast infection Frequency: na Pertinent Negatives: Patient denies na Disposition: '[]'$ ED /'[]'$ Urgent Care (no appt availability in office) / '[]'$ Appointment(In office/virtual)/ '[]'$  [place] [facility]/ '[]'$ Home Care/ '[]'$ Refused Recommended Disposition /'[]'$ [place] [facility]/ '[x]'$  Follow-up with PCP Additional Notes:   Please advise . Requesting medication refill and new medication diflucan for preventive care. Please advise      Reason for Disposition  Prescription request for new medicine (not a refill)  Answer Assessment - Initial Assessment Questions 1. DRUG NAME: "What medicine do you need to have refilled?"     cefdinir (OMNICEF) 300 mg and diflucan 2. REFILLS REMAINING: "How many refills are remaining?" (Note: The label on the medicine or pill bottle will show how many refills are remaining. If there are no refills remaining, then a renewal may be needed.)     na 3. EXPIRATION DATE: "What is the expiration date?" (Note: The label states when the prescription will expire, and thus can no longer be refilled.)     na 4. PRESCRIBING HCP: "Who prescribed it?" Reason: If prescribed by specialist, call should be referred to that group.     PCP 5. SYMPTOMS: "Do you have any symptoms?"     Yes ears continue with pain, top of nose and eyebrows 6. PREGNANCY: "Is there any chance that you are pregnant?" "When was your last menstrual period?"     na  Protocols used: Medication Refill and Renewal Call-A-AH

## 2022-12-21 ENCOUNTER — Ambulatory Visit: Payer: Self-pay

## 2022-12-21 ENCOUNTER — Ambulatory Visit
Admission: RE | Admit: 2022-12-21 | Discharge: 2022-12-21 | Disposition: A | Discharge status: Home / Self Care | Source: Ambulatory Visit | Attending: Nurse Practitioner | Admitting: Nurse Practitioner

## 2022-12-21 ENCOUNTER — Telehealth: Payer: Self-pay | Admitting: Internal Medicine

## 2022-12-21 VITALS — BP 137/80 | HR 89 | Temp 98.3°F | Resp 18

## 2022-12-21 DIAGNOSIS — J0101 Acute recurrent maxillary sinusitis: Secondary | ICD-10-CM | POA: Diagnosis not present

## 2022-12-21 DIAGNOSIS — T3695XA Adverse effect of unspecified systemic antibiotic, initial encounter: Secondary | ICD-10-CM

## 2022-12-21 DIAGNOSIS — H66004 Acute suppurative otitis media without spontaneous rupture of ear drum, recurrent, right ear: Secondary | ICD-10-CM

## 2022-12-21 DIAGNOSIS — B379 Candidiasis, unspecified: Secondary | ICD-10-CM | POA: Diagnosis not present

## 2022-12-21 MED ORDER — FLUCONAZOLE 150 MG PO TABS: 150.0000 mg | ORAL_TABLET | Freq: Every day | ORAL | 0 refills | Status: AC

## 2022-12-21 MED ORDER — DOXYCYCLINE HYCLATE 100 MG PO CAPS: 100.0000 mg | ORAL_CAPSULE | Freq: Two times a day (BID) | ORAL | 0 refills | Status: DC

## 2022-12-21 NOTE — Telephone Encounter (Signed)
  Chief Complaint: sinus infection FU Symptoms: HA BP 145/90, ear pain bilat, nasal congestion, sinus pressure Frequency: several days Pertinent Negatives: NA Disposition: '[]'$ ED /'[x]'$ Urgent Care (no appt availability in office) / '[]'$ Appointment(In office/virtual)/ '[]'$  [place] [facility]/ '[]'$ Home Care/ '[]'$ Refused Recommended Disposition /'[]'$ [place] [facility]/ '[]'$  Follow-up with PCP Additional Notes: pt advised she has to have OV for refill of abx, no appts available for VV so scheduled UC visit today at 1815 for FU. Pt verbalized understanding.   Summary: Infection   Patient is saying her body is reacting to the infection and her blood pressure is rising. Patient has an appointment on 02/22 and states she cannot wait that long to be seen. Last blood pressure reading was 145/90. Please advise patient.         Reason for Disposition  [1] Taking antibiotic > 72 hours (3 days) AND [2] sinus pain not improved  Answer Assessment - Initial Assessment Questions 1. ANTIBIOTIC: "What antibiotic are you taking?" "How many times a day?"     Cefdinir  2. ONSET: "When was the antibiotic started?"     3 weeks 3. PAIN: "How bad is the sinus pain?"   (Scale 1-10; mild, moderate or severe)   - MILD (1-3): doesn't interfere with normal activities    - MODERATE (4-7): interferes with normal activities (e.g., work or school) or awakens from sleep   - SEVERE (8-10): excruciating pain and patient unable to do any normal activities        Mild to moderate- HA and ear pain, nose congestion 5. SYMPTOMS: "Are there any other symptoms you're concerned about?" If Yes, ask: "When did it start?"     BP 145/90, symptoms came back  Protocols used: Sinus Infection on Antibiotic Follow-up Call-A-AH

## 2022-12-21 NOTE — ED Provider Notes (Signed)
[facility]    CSN: [id number] Arrival date & time: 12/21/22  1812      History   Chief Complaint Chief Complaint  Patient presents with   Ear Fullness    sinus infection FU// [facility]// [phone]// PEC NT - Entered by patient   Headache   Facial Pain   Nasal Congestion    HPI Janet Richards is a 62 y.o. female presents for evaluation of ear pain and sinusitis.  Patient was seen in urgent care on 1/24 treated with cefdinir for sinusitis and reported right ear infection.  She states her symptoms improved but did not completely resolve and has begun to worsen again.  She endorses sinus pressure and pain with right ear pain.  No fevers.  She attempted to get into her PCP for follow-up but is not able to be seen until March.  She is continue to use nasal sprays.  No other concerns at this time.   Ear Fullness Associated symptoms include headaches.  Headache Associated symptoms: ear pain and sinus pressure     Past Medical History:  Diagnosis Date   Allergy    Asthma    Chronic pancreatitis ([place])    Essential hypertension, benign    Menstrual migraine    Periodontitis    Prediabetes 10/17/2014   Primary osteoarthritis of both knees    Sinusitis    Urticaria     Patient Active Problem List   Diagnosis Date Noted   Asthma 08/18/2022   Contact dermatitis 08/18/2022   Dermatitis due to drugs and medicines taken internally 08/18/2022   Eczema 08/18/2022   Lateral epicondylitis of elbow 08/18/2022   Low back pain 08/18/2022   Overweight 08/18/2022   History of tubal ligation 09/17/2021   Osteoarthritis of knee 09/17/2021   Vaccine counseling 04/09/2020   Palpitations 03/09/2020   Irritation of ear, bilateral 03/09/2020   Generalized headaches 02/18/2020   GERD (gastroesophageal reflux disease) 02/18/2020   Class 1 obesity due to excess calories with serious comorbidity and body mass index (BMI) of 33.0 to 33.9 in adult 01/19/2020   Dysuria 09/16/2019    Chronic pancreatitis ([place]) 04/10/2019   Environmental allergies 09/12/2018   Mild intermittent asthma without complication 0000000   Dyspepsia 07/31/2018   Sensory neuronopathy 07/29/2018   Post-menopausal bleeding 05/21/2018   DDD (degenerative disc disease), cervical 02/22/2018   Periodontitis 12/15/2017   Vaginal dryness, menopausal 03/01/2017   Abnormal ankle brachial index (ABI) 11/09/2016   Numbness and tingling of both feet 09/02/2016   Ganglion cyst of right foot 07/12/2016   History of colonic polyps 03/20/2015   Hyperlipidemia 02/20/2015   Other allergic rhinitis 01/14/2015   Prediabetes 10/17/2014   Osteoarthritis of first metatarsophalangeal joint 04/11/2014   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/23/2013   No blood products 03/28/2013   Essential hypertension 03/01/2013   Primary osteoarthritis of both knees 12/27/2012    Past Surgical History:  Procedure Laterality Date   TUBAL LIGATION  12/26/1987    OB History     Gravida  5   Para  3   Term  3   Preterm  0   AB  2   Living  3      SAB  0   IAB  2   Ectopic  0   Multiple      Live Births  3            Home Medications    Prior to Admission medications   Medication Sig  Start Date End Date Taking? Authorizing Provider  doxycycline (VIBRAMYCIN) 100 MG capsule Take 1 capsule (100 mg total) by mouth 2 (two) times daily. 12/21/22  Yes Melynda Ripple, NP  fluconazole (DIFLUCAN) 150 MG tablet Take 1 tablet (150 mg total) by mouth daily for 2 doses. Take 1 capsule on day 3 of antibiotics and may repeat 3 days later 12/21/22 12/23/22 Yes Melynda Ripple, NP  cefdinir (OMNICEF) 300 MG capsule Take 1 capsule (300 mg total) by mouth 2 (two) times daily. 11/30/22   Jaynee Eagles, PA-C  cetirizine (ZYRTEC) 10 MG tablet TAKE ONE TABLET BY MOUTH EVERY DAY 12/06/22   Ladell Pier, MD  hydrochlorothiazide (HYDRODIURIL) 25 MG tablet Take one tablet (25 mg dose) by mouth daily. 12/01/22   Ladell Pier, MD   ibuprofen (ADVIL) 600 MG tablet Take 1 tablet (600 mg total) by mouth every 6 (six) hours as needed. 09/09/22   Wilnette Kales, PA  Multiple Vitamins-Minerals (MULTIVITAMIN ADULT PO) Take by mouth.    [provider]  Nutritional Supplements (IMMUNOCAL PO) Take by mouth.    [provider]    Family History Family History  Problem Relation Age of Onset   Arthritis Mother    Hypertension Mother    CAD Father        MI in his 10s   Allergic rhinitis Father    Urticaria Sister    Colon cancer Sister    Allergic rhinitis Son    Asthma Son    Angioedema Neg Hx    Eczema Neg Hx    Immunodeficiency Neg Hx    Esophageal cancer Neg Hx    Stomach cancer Neg Hx    Rectal cancer Neg Hx     Social History Social History   Tobacco Use   Smoking status: Former    Packs/day: 1.50    Types: Cigarettes    Quit date: 02/05/2006    Years since quitting: 16.8   Smokeless tobacco: Never  Vaping Use   Vaping Use: Never used  Substance Use Topics   Alcohol use: Not Currently   Drug use: No     Allergies   Azithromycin, Clindamycin/lincomycin, and Erythromycin   Review of Systems Review of Systems  HENT:  Positive for ear pain, sinus pressure and sinus pain.   Neurological:  Positive for headaches.     Physical Exam Triage Vital Signs ED Triage Vitals  Enc Vitals Group     BP 12/21/22 1834 137/80     Pulse Rate 12/21/22 1834 89     Resp 12/21/22 1834 18     Temp 12/21/22 1834 98.3 F (36.8 C)     Temp Source 12/21/22 1834 Oral     SpO2 12/21/22 1834 97 %     Weight --      Height --      Head Circumference --      Peak Flow --      Pain Score 12/21/22 1833 7     Pain Loc --      Pain Edu? --      Excl. in [place]? --    No data found.  Updated Vital Signs BP 137/80 (BP Location: Left Arm)   Pulse 89   Temp 98.3 F (36.8 C) (Oral)   Resp 18   LMP 09/11/2017 (Approximate)   SpO2 97%   Visual Acuity Right Eye Distance:   Left Eye Distance:    Bilateral Distance:    Right  Eye Near:   Left Eye Near:    Bilateral Near:     Physical Exam Vitals and nursing note reviewed.  Constitutional:      General: She is not in acute distress.    Appearance: Normal appearance. She is well-developed. She is not ill-appearing.  HENT:     Head: Normocephalic and atraumatic.     Right Ear: Ear canal normal. Tympanic membrane is injected. Tympanic membrane is not retracted or bulging.     Left Ear: Tympanic membrane and ear canal normal.     Nose: Congestion present.     Right Sinus: Maxillary sinus tenderness present.     Left Sinus: Maxillary sinus tenderness present.     Mouth/Throat:     Mouth: Mucous membranes are moist.     Pharynx: No oropharyngeal exudate or posterior oropharyngeal erythema.  Eyes:     Pupils: Pupils are equal, round, and reactive to light.  Cardiovascular:     Rate and Rhythm: Normal rate and regular rhythm.     Heart sounds: Normal heart sounds.  Pulmonary:     Breath sounds: Normal breath sounds.  Skin:    General: Skin is warm and dry.  Neurological:     General: No focal deficit present.     Mental Status: She is alert and oriented to person, place, and time.  Psychiatric:        Mood and Affect: Mood normal.        Behavior: Behavior normal.      UC Treatments / Results  Labs (all labs ordered are listed, but only abnormal results are displayed) Labs Reviewed - No data to display  EKG   Radiology No results found.  Procedures Procedures (including critical care time)  Medications Ordered in UC Medications - No data to display  Initial Impression / Assessment and Plan / UC Course  I have reviewed the triage vital signs and the nursing notes.  Pertinent labs & imaging results that were available during my care of the patient were reviewed by me and considered in my medical decision making (see chart for details).     Reviewed exam and symptoms with patient.  Possible treatment  failure with cefdinir.  Patient cannot take azithromycin and does not like penicillins Will start doxycycline 100 mg twice daily for 10 days Diflucan as she reports history of antibiotic induced yeast infections Continue nasal sprays as prescribed Nasal rinses as tolerated Follow-up with PCP if symptoms do not improve ER precautions reviewed and patient verbalized understanding Final Clinical Impressions(s) / UC Diagnoses   Final diagnoses:  Acute recurrent maxillary sinusitis  Recurrent acute suppurative otitis media of right ear without spontaneous rupture of tympanic membrane  Antibiotic-induced yeast infection     Discharge Instructions      Doxycycline twice daily for 10 days Continue nasal sprays and nasal rinses Follow-up with your PCP if symptoms or not improving Please go to emergency room for any worsening symptoms    ED Prescriptions     Medication Sig Dispense Auth. Provider   doxycycline (VIBRAMYCIN) 100 MG capsule Take 1 capsule (100 mg total) by mouth 2 (two) times daily. 20 capsule Melynda Ripple, NP   fluconazole (DIFLUCAN) 150 MG tablet Take 1 tablet (150 mg total) by mouth daily for 2 doses. Take 1 capsule on day 3 of antibiotics and may repeat 3 days later 2 tablet Melynda Ripple, NP      PDMP not reviewed this encounter.   Prudence Davidson,  Pilar Plate, NP 12/21/22 1859

## 2022-12-21 NOTE — ED Triage Notes (Signed)
Pt c/o bilateral ear pain (worse on right), headache, facial pressure, congestion, sore to roof of mouth, and elevated BP 145/90.  Home interventions: amoxicillin

## 2022-12-21 NOTE — Telephone Encounter (Signed)
Forwarding to pt's PCP. I cannot routinely refill antibiotics and/or antifungals. The patient may need an office visit for reevaluation.

## 2022-12-21 NOTE — Telephone Encounter (Signed)
Noted  

## 2022-12-21 NOTE — Telephone Encounter (Signed)
Pt called to see if the Rx for cefdinir (OMNICEF) 300 MG capsule was refilled / she also stated she needs an RX for Diflucan / please advise

## 2022-12-21 NOTE — Discharge Instructions (Signed)
Doxycycline twice daily for 10 days Continue nasal sprays and nasal rinses Follow-up with your PCP if symptoms or not improving Please go to emergency room for any worsening symptoms

## 2022-12-22 NOTE — Telephone Encounter (Signed)
Called & spoke to the patient. Verified name & DOB. Informed patient that an appointment is needed. Patient stated that she went to UC and was able to get a prescription & also canceled her next appointment. Janet Richards will be changing back to previous PCP.

## 2022-12-30 ENCOUNTER — Ambulatory Visit: Payer: Medicare Other | Admitting: Internal Medicine

## 2023-02-10 DIAGNOSIS — Z9109 Other allergy status, other than to drugs and biological substances: Secondary | ICD-10-CM | POA: Diagnosis not present

## 2023-02-10 DIAGNOSIS — S1091XA Abrasion of unspecified part of neck, initial encounter: Secondary | ICD-10-CM | POA: Diagnosis not present

## 2023-03-01 ENCOUNTER — Other Ambulatory Visit: Payer: Self-pay | Admitting: Internal Medicine

## 2023-03-01 DIAGNOSIS — J3089 Other allergic rhinitis: Secondary | ICD-10-CM

## 2023-03-07 DIAGNOSIS — I10 Essential (primary) hypertension: Secondary | ICD-10-CM | POA: Diagnosis not present

## 2023-03-07 DIAGNOSIS — K047 Periapical abscess without sinus: Secondary | ICD-10-CM | POA: Diagnosis not present

## 2023-03-07 DIAGNOSIS — Z1330 Encounter for screening examination for mental health and behavioral disorders, unspecified: Secondary | ICD-10-CM | POA: Diagnosis not present

## 2023-03-07 DIAGNOSIS — R7303 Prediabetes: Secondary | ICD-10-CM | POA: Diagnosis not present

## 2023-03-07 DIAGNOSIS — N644 Mastodynia: Secondary | ICD-10-CM | POA: Diagnosis not present

## 2023-03-07 DIAGNOSIS — E782 Mixed hyperlipidemia: Secondary | ICD-10-CM | POA: Diagnosis not present

## 2023-03-22 DIAGNOSIS — J9801 Acute bronchospasm: Secondary | ICD-10-CM | POA: Diagnosis not present

## 2023-03-22 DIAGNOSIS — Z77120 Contact with and (suspected) exposure to mold (toxic): Secondary | ICD-10-CM | POA: Diagnosis not present

## 2023-03-22 DIAGNOSIS — K056 Periodontal disease, unspecified: Secondary | ICD-10-CM | POA: Diagnosis not present

## 2023-03-30 DIAGNOSIS — R6884 Jaw pain: Secondary | ICD-10-CM | POA: Diagnosis not present

## 2023-03-30 DIAGNOSIS — K0510 Chronic gingivitis, plaque induced: Secondary | ICD-10-CM | POA: Diagnosis not present

## 2023-03-30 DIAGNOSIS — K122 Cellulitis and abscess of mouth: Secondary | ICD-10-CM | POA: Diagnosis not present

## 2023-03-30 DIAGNOSIS — A499 Bacterial infection, unspecified: Secondary | ICD-10-CM | POA: Diagnosis not present

## 2023-04-05 DIAGNOSIS — G8929 Other chronic pain: Secondary | ICD-10-CM | POA: Diagnosis not present

## 2023-04-05 DIAGNOSIS — I10 Essential (primary) hypertension: Secondary | ICD-10-CM | POA: Diagnosis not present

## 2023-04-05 DIAGNOSIS — M79672 Pain in left foot: Secondary | ICD-10-CM | POA: Diagnosis not present

## 2023-04-05 DIAGNOSIS — R0789 Other chest pain: Secondary | ICD-10-CM | POA: Diagnosis not present

## 2023-04-05 DIAGNOSIS — M545 Low back pain, unspecified: Secondary | ICD-10-CM | POA: Diagnosis not present

## 2023-04-05 DIAGNOSIS — M25512 Pain in left shoulder: Secondary | ICD-10-CM | POA: Diagnosis not present

## 2023-04-05 DIAGNOSIS — M79671 Pain in right foot: Secondary | ICD-10-CM | POA: Diagnosis not present

## 2023-04-06 ENCOUNTER — Ambulatory Visit: Admitting: Internal Medicine

## 2023-04-10 DIAGNOSIS — R7303 Prediabetes: Secondary | ICD-10-CM | POA: Diagnosis not present

## 2023-04-10 DIAGNOSIS — G629 Polyneuropathy, unspecified: Secondary | ICD-10-CM | POA: Diagnosis not present

## 2023-04-10 DIAGNOSIS — I10 Essential (primary) hypertension: Secondary | ICD-10-CM | POA: Diagnosis not present

## 2023-04-10 DIAGNOSIS — D509 Iron deficiency anemia, unspecified: Secondary | ICD-10-CM | POA: Diagnosis not present

## 2023-04-10 DIAGNOSIS — R42 Dizziness and giddiness: Secondary | ICD-10-CM | POA: Diagnosis not present

## 2023-04-18 DIAGNOSIS — H539 Unspecified visual disturbance: Secondary | ICD-10-CM | POA: Diagnosis not present

## 2023-04-18 DIAGNOSIS — I10 Essential (primary) hypertension: Secondary | ICD-10-CM | POA: Diagnosis not present

## 2023-04-18 DIAGNOSIS — E782 Mixed hyperlipidemia: Secondary | ICD-10-CM | POA: Diagnosis not present

## 2023-04-18 DIAGNOSIS — Z0000 Encounter for general adult medical examination without abnormal findings: Secondary | ICD-10-CM | POA: Diagnosis not present

## 2023-04-25 DIAGNOSIS — K080 Exfoliation of teeth due to systemic causes: Secondary | ICD-10-CM | POA: Diagnosis not present

## 2023-05-01 DIAGNOSIS — G8929 Other chronic pain: Secondary | ICD-10-CM | POA: Diagnosis not present

## 2023-05-01 DIAGNOSIS — M545 Low back pain, unspecified: Secondary | ICD-10-CM | POA: Diagnosis not present

## 2023-05-01 DIAGNOSIS — M25512 Pain in left shoulder: Secondary | ICD-10-CM | POA: Diagnosis not present

## 2023-05-18 DIAGNOSIS — K080 Exfoliation of teeth due to systemic causes: Secondary | ICD-10-CM | POA: Diagnosis not present

## 2023-05-23 DIAGNOSIS — L299 Pruritus, unspecified: Secondary | ICD-10-CM | POA: Diagnosis not present

## 2023-05-23 DIAGNOSIS — L509 Urticaria, unspecified: Secondary | ICD-10-CM | POA: Diagnosis not present

## 2023-05-23 DIAGNOSIS — Z91018 Allergy to other foods: Secondary | ICD-10-CM | POA: Diagnosis not present

## 2023-05-23 DIAGNOSIS — R053 Chronic cough: Secondary | ICD-10-CM | POA: Diagnosis not present

## 2023-05-31 DIAGNOSIS — M25512 Pain in left shoulder: Secondary | ICD-10-CM | POA: Diagnosis not present

## 2023-05-31 DIAGNOSIS — G8929 Other chronic pain: Secondary | ICD-10-CM | POA: Diagnosis not present

## 2023-05-31 DIAGNOSIS — M545 Low back pain, unspecified: Secondary | ICD-10-CM | POA: Diagnosis not present

## 2023-06-13 DIAGNOSIS — I10 Essential (primary) hypertension: Secondary | ICD-10-CM | POA: Diagnosis not present

## 2023-06-13 DIAGNOSIS — R1013 Epigastric pain: Secondary | ICD-10-CM | POA: Diagnosis not present

## 2023-06-13 DIAGNOSIS — R1011 Right upper quadrant pain: Secondary | ICD-10-CM | POA: Diagnosis not present

## 2023-06-14 DIAGNOSIS — M25512 Pain in left shoulder: Secondary | ICD-10-CM | POA: Diagnosis not present

## 2023-06-14 DIAGNOSIS — G8929 Other chronic pain: Secondary | ICD-10-CM | POA: Diagnosis not present

## 2023-06-14 DIAGNOSIS — M545 Low back pain, unspecified: Secondary | ICD-10-CM | POA: Diagnosis not present

## 2023-06-21 DIAGNOSIS — G8929 Other chronic pain: Secondary | ICD-10-CM | POA: Diagnosis not present

## 2023-06-21 DIAGNOSIS — M545 Low back pain, unspecified: Secondary | ICD-10-CM | POA: Diagnosis not present

## 2023-06-21 DIAGNOSIS — M25512 Pain in left shoulder: Secondary | ICD-10-CM | POA: Diagnosis not present

## 2023-06-26 DIAGNOSIS — N952 Postmenopausal atrophic vaginitis: Secondary | ICD-10-CM | POA: Diagnosis not present

## 2023-06-26 DIAGNOSIS — N898 Other specified noninflammatory disorders of vagina: Secondary | ICD-10-CM | POA: Diagnosis not present

## 2023-06-26 DIAGNOSIS — R1033 Periumbilical pain: Secondary | ICD-10-CM | POA: Diagnosis not present

## 2023-07-17 DIAGNOSIS — Z23 Encounter for immunization: Secondary | ICD-10-CM | POA: Diagnosis not present

## 2023-07-18 DIAGNOSIS — R1013 Epigastric pain: Secondary | ICD-10-CM | POA: Diagnosis not present

## 2023-07-18 DIAGNOSIS — K760 Fatty (change of) liver, not elsewhere classified: Secondary | ICD-10-CM | POA: Insufficient documentation

## 2023-07-18 DIAGNOSIS — R1011 Right upper quadrant pain: Secondary | ICD-10-CM | POA: Diagnosis not present

## 2023-07-18 HISTORY — DX: Fatty (change of) liver, not elsewhere classified: K76.0

## 2023-08-22 DIAGNOSIS — R7303 Prediabetes: Secondary | ICD-10-CM | POA: Diagnosis not present

## 2023-08-22 DIAGNOSIS — Z23 Encounter for immunization: Secondary | ICD-10-CM | POA: Diagnosis not present

## 2023-08-22 DIAGNOSIS — J329 Chronic sinusitis, unspecified: Secondary | ICD-10-CM | POA: Diagnosis not present

## 2023-08-22 DIAGNOSIS — E782 Mixed hyperlipidemia: Secondary | ICD-10-CM | POA: Diagnosis not present

## 2023-08-29 DIAGNOSIS — E782 Mixed hyperlipidemia: Secondary | ICD-10-CM | POA: Diagnosis not present

## 2023-08-29 DIAGNOSIS — R7303 Prediabetes: Secondary | ICD-10-CM | POA: Diagnosis not present

## 2023-08-29 DIAGNOSIS — Z23 Encounter for immunization: Secondary | ICD-10-CM | POA: Diagnosis not present

## 2023-09-07 DIAGNOSIS — S50862A Insect bite (nonvenomous) of left forearm, initial encounter: Secondary | ICD-10-CM | POA: Diagnosis not present

## 2023-09-07 DIAGNOSIS — W57XXXA Bitten or stung by nonvenomous insect and other nonvenomous arthropods, initial encounter: Secondary | ICD-10-CM | POA: Diagnosis not present

## 2023-09-08 DIAGNOSIS — Z1231 Encounter for screening mammogram for malignant neoplasm of breast: Secondary | ICD-10-CM | POA: Diagnosis not present

## 2023-09-08 DIAGNOSIS — R92323 Mammographic fibroglandular density, bilateral breasts: Secondary | ICD-10-CM | POA: Diagnosis not present

## 2023-09-25 DIAGNOSIS — Z124 Encounter for screening for malignant neoplasm of cervix: Secondary | ICD-10-CM | POA: Diagnosis not present

## 2023-10-02 DIAGNOSIS — K219 Gastro-esophageal reflux disease without esophagitis: Secondary | ICD-10-CM | POA: Diagnosis not present

## 2023-10-02 DIAGNOSIS — M19071 Primary osteoarthritis, right ankle and foot: Secondary | ICD-10-CM | POA: Diagnosis not present

## 2023-10-02 DIAGNOSIS — K047 Periapical abscess without sinus: Secondary | ICD-10-CM | POA: Diagnosis not present

## 2023-10-02 DIAGNOSIS — M25571 Pain in right ankle and joints of right foot: Secondary | ICD-10-CM | POA: Diagnosis not present

## 2023-10-18 DIAGNOSIS — M7731 Calcaneal spur, right foot: Secondary | ICD-10-CM | POA: Diagnosis not present

## 2023-10-18 DIAGNOSIS — S93401A Sprain of unspecified ligament of right ankle, initial encounter: Secondary | ICD-10-CM | POA: Diagnosis not present

## 2023-10-18 DIAGNOSIS — M25571 Pain in right ankle and joints of right foot: Secondary | ICD-10-CM | POA: Diagnosis not present

## 2023-10-18 DIAGNOSIS — M7989 Other specified soft tissue disorders: Secondary | ICD-10-CM | POA: Diagnosis not present

## 2023-10-28 DIAGNOSIS — Z881 Allergy status to other antibiotic agents status: Secondary | ICD-10-CM | POA: Diagnosis not present

## 2023-10-28 DIAGNOSIS — E876 Hypokalemia: Secondary | ICD-10-CM | POA: Diagnosis not present

## 2023-10-28 DIAGNOSIS — I10 Essential (primary) hypertension: Secondary | ICD-10-CM | POA: Diagnosis not present

## 2023-10-28 DIAGNOSIS — R9431 Abnormal electrocardiogram [ECG] [EKG]: Secondary | ICD-10-CM | POA: Diagnosis not present

## 2023-10-28 DIAGNOSIS — Z87891 Personal history of nicotine dependence: Secondary | ICD-10-CM | POA: Diagnosis not present

## 2023-10-28 DIAGNOSIS — Z79899 Other long term (current) drug therapy: Secondary | ICD-10-CM | POA: Diagnosis not present

## 2023-10-28 DIAGNOSIS — R519 Headache, unspecified: Secondary | ICD-10-CM | POA: Diagnosis not present

## 2023-10-28 DIAGNOSIS — Z888 Allergy status to other drugs, medicaments and biological substances status: Secondary | ICD-10-CM | POA: Diagnosis not present

## 2023-10-28 DIAGNOSIS — Z883 Allergy status to other anti-infective agents status: Secondary | ICD-10-CM | POA: Diagnosis not present

## 2023-11-22 DIAGNOSIS — W57XXXA Bitten or stung by nonvenomous insect and other nonvenomous arthropods, initial encounter: Secondary | ICD-10-CM | POA: Diagnosis not present

## 2023-11-22 DIAGNOSIS — Z77120 Contact with and (suspected) exposure to mold (toxic): Secondary | ICD-10-CM | POA: Diagnosis not present

## 2023-11-22 DIAGNOSIS — I10 Essential (primary) hypertension: Secondary | ICD-10-CM | POA: Diagnosis not present

## 2023-12-04 DIAGNOSIS — M25571 Pain in right ankle and joints of right foot: Secondary | ICD-10-CM | POA: Diagnosis not present

## 2023-12-12 DIAGNOSIS — S90561A Insect bite (nonvenomous), right ankle, initial encounter: Secondary | ICD-10-CM | POA: Diagnosis not present

## 2023-12-12 DIAGNOSIS — W57XXXA Bitten or stung by nonvenomous insect and other nonvenomous arthropods, initial encounter: Secondary | ICD-10-CM | POA: Diagnosis not present

## 2023-12-12 DIAGNOSIS — B3731 Acute candidiasis of vulva and vagina: Secondary | ICD-10-CM | POA: Diagnosis not present

## 2024-01-11 ENCOUNTER — Encounter: Payer: Self-pay | Admitting: Family Medicine

## 2024-01-11 ENCOUNTER — Ambulatory Visit: Payer: Medicare Other | Admitting: Family Medicine

## 2024-01-11 ENCOUNTER — Ambulatory Visit

## 2024-01-11 VITALS — BP 138/74 | HR 89 | Temp 98.5°F | Resp 18 | Ht 63.0 in | Wt 190.8 lb

## 2024-01-11 DIAGNOSIS — Z13228 Encounter for screening for other metabolic disorders: Secondary | ICD-10-CM | POA: Insufficient documentation

## 2024-01-11 DIAGNOSIS — I10 Essential (primary) hypertension: Secondary | ICD-10-CM | POA: Diagnosis not present

## 2024-01-11 DIAGNOSIS — M7989 Other specified soft tissue disorders: Secondary | ICD-10-CM | POA: Diagnosis not present

## 2024-01-11 DIAGNOSIS — M25571 Pain in right ankle and joints of right foot: Secondary | ICD-10-CM | POA: Diagnosis not present

## 2024-01-11 DIAGNOSIS — Z23 Encounter for immunization: Secondary | ICD-10-CM

## 2024-01-11 DIAGNOSIS — G8929 Other chronic pain: Secondary | ICD-10-CM

## 2024-01-11 DIAGNOSIS — M19071 Primary osteoarthritis, right ankle and foot: Secondary | ICD-10-CM | POA: Diagnosis not present

## 2024-01-11 DIAGNOSIS — Z77120 Contact with and (suspected) exposure to mold (toxic): Secondary | ICD-10-CM | POA: Insufficient documentation

## 2024-01-11 DIAGNOSIS — M7731 Calcaneal spur, right foot: Secondary | ICD-10-CM | POA: Diagnosis not present

## 2024-01-11 HISTORY — DX: Contact with and (suspected) exposure to mold (toxic): Z77.120

## 2024-01-11 HISTORY — DX: Encounter for screening for other metabolic disorders: Z13.228

## 2024-01-11 MED ORDER — HYDROCHLOROTHIAZIDE 25 MG PO TABS: ORAL_TABLET | ORAL | 0 refills | Status: DC

## 2024-01-11 NOTE — Assessment & Plan Note (Signed)
 Taking hydrochlorothiazide 25 mg daily (forgot to take today) Denies chest pain, shortness of breath, lower extremity edema, vision changes, headaches.  Monitors blood pressure at home.  Well controlled in office upon recheck despite missing dose of medication today. Refill sent. Follow-up in 3 months for medication management.

## 2024-01-11 NOTE — Assessment & Plan Note (Signed)
 Pain and swelling > 3 months. No known injury. X-ray ordered. Referral to ortho placed.

## 2024-01-11 NOTE — Assessment & Plan Note (Signed)
 MMR booster due to recent measles outbreak.

## 2024-01-11 NOTE — Patient Instructions (Addendum)
 Healthy Heart:  Recommend heart healthy/Mediterranean diet, with whole grains, fruits, vegetable, fish, lean meats, nuts, and olive oil. Limit salt. Recommend moderate walking, 3-5 times/week for 30-50 minutes each session. Aim for at least 150 minutes.week. Goal should be pace of 3 miles/hours, or walking 1.5 miles in 30 minutes Recommend avoidance of tobacco products. Avoid excess alcohol.   [facility]  [address]  The radiology department is on the first floor which is best accessed by going around to the back of the building. No appointment necessary. You can go at your convenience.

## 2024-01-11 NOTE — Assessment & Plan Note (Signed)
 Had mold exposure in 3 different housing locations. She is finally in a mold free environment. Symptoms have improved. Has appointment with allergist in May. Follow-up with PCP as needed.

## 2024-01-11 NOTE — Progress Notes (Signed)
 New Patient Office Visit  Subjective    Patient ID: Janet Richards, female    DOB: [birth year]  Age: 63 y.o. MRN: [id number]  CC:  Chief Complaint  Patient presents with   Establish Care    Patient is here to establish care with  a new PCP    HPI Janet Richards presents to establish care with this practice. She is new to me.   Hypertension Medication compliance: Taking hydrochlorothiazide 25 mg daily (forgot to take today) Denies chest pain, shortness of breath, lower extremity edema, vision changes, headaches.  Pertinent lab work: 10/28/23: CMP  Monitoring at home: 127/80 or 143/80  Tolerating medication well: no side effects reported  Continue current medication regimen: no change in treatment today.  Follow-up: 3 months    Mold exposure: discovered black mold in previous place she lived.  Moved from this location with black mold. The third location also had mold and mildew. Moved one month ago to a mold free location. Symptoms are improved since being in a mold free environment.  Has an allergist, scheduled for May 2025.    Outpatient Encounter Medications as of 01/11/2024  Medication Sig   azelastine (ASTELIN) 0.1 % nasal spray Place 1 spray into both nostrils 2 (two) times daily.   cefdinir (OMNICEF) 300 MG capsule Take 1 capsule (300 mg total) by mouth 2 (two) times daily.   cetirizine (ZYRTEC) 10 MG tablet TAKE ONE TABLET BY MOUTH EVERY DAY   doxycycline (VIBRAMYCIN) 100 MG capsule Take 1 capsule (100 mg total) by mouth 2 (two) times daily.   ibuprofen (ADVIL) 600 MG tablet Take 1 tablet (600 mg total) by mouth every 6 (six) hours as needed.   Multiple Vitamins-Minerals (MULTIVITAMIN ADULT PO) Take by mouth.   Nutritional Supplements (IMMUNOCAL PO) Take by mouth.   [DISCONTINUED] chlorthalidone (HYGROTON) 25 MG tablet Take 25 mg by mouth daily.   hydrochlorothiazide (HYDRODIURIL) 25 MG tablet Take one tablet (25 mg dose) by mouth daily.   [DISCONTINUED]  hydrochlorothiazide (HYDRODIURIL) 25 MG tablet Take one tablet (25 mg dose) by mouth daily.   No facility-administered encounter medications on file as of 01/11/2024.    Past Medical History:  Diagnosis Date   Allergy    Asthma    Chronic pancreatitis (HCC)    Essential hypertension, benign    Menstrual migraine    Periodontitis    Prediabetes 10/17/2014   Primary osteoarthritis of both knees    Sinusitis    Urticaria     Past Surgical History:  Procedure Laterality Date   TUBAL LIGATION  12/26/1987    Family History  Problem Relation Age of Onset   Arthritis Mother    Hypertension Mother    CAD Father        MI in his 50s   Allergic rhinitis Father    Urticaria Sister    Colon cancer Sister    Allergic rhinitis Son    Asthma Son    Angioedema Neg Hx    Eczema Neg Hx    Immunodeficiency Neg Hx    Esophageal cancer Neg Hx    Stomach cancer Neg Hx    Rectal cancer Neg Hx     Social History   Socioeconomic History   Marital status: Married    Spouse name: Not on file   Number of children: 3   Years of education: Not on file   Highest education level: Not on file  Occupational History   Occupation: homemaker  Tobacco Use   Smoking status: Former    Current packs/day: 0.00    Types: Cigarettes    Quit date: 02/05/2006    Years since quitting: 17.9   Smokeless tobacco: Never  Vaping Use   Vaping status: Never Used  Substance and Sexual Activity   Alcohol use: Not Currently   Drug use: No   Sexual activity: Yes    Partners: Male  Other Topics Concern   Not on file  Social History Narrative   ** Merged History Encounter **       Social Drivers of Health   Financial Resource Strain: Low Risk  (04/18/2023)   Received from [facility]   Overall Financial Resource Strain (CARDIA)    Difficulty of Paying Living Expenses: Not hard at all  Food Insecurity: No Food Insecurity (04/18/2023)   [facility]   Hunger Vital Sign    Worried About  Running Out of Food in the Last Year: Never true    Ran Out of Food in the Last Year: Never true  Transportation Needs: No Transportation Needs (04/18/2023)   Received [facility] - Transportation    Lack of Transportation (Medical): No    Lack of Transportation (Non-Medical): No  Physical Activity: Unknown (04/18/2023)   Received from [facility]   Exercise Vital Sign    Days of Exercise per Week: 0 days    Minutes of Exercise per Session: Not on file  Stress: No Stress Concern Present (04/18/2023)   Received from [facility] - Occupational Stress Questionnaire    Feeling of Stress : Not at all  Social Connections: Socially Integrated (04/18/2023)   Received from [facility]   Social Network    How would you rate your social network (family, work, friends)?: Good participation with social networks  Intimate Partner Violence: Not At Risk (10/28/2023)   Received from [facility]   HITS    Over the last 12 months how often did your partner physically hurt you?: Never    Over the last 12 months how often did your partner insult you or talk down to you?: Never    Over the last 12 months how often did your partner threaten you with physical harm?: Never    Over the last 12 months how often did your partner scream or curse at you?: Never    ROS      Objective    BP 138/74 (BP Location: Right Arm, Patient Position: Sitting, Cuff Size: Large)   Pulse 89   Temp 98.5 F (36.9 C) (Oral)   Resp 18   Ht 5\' 3"  (1.6 m)   Wt 190 lb 12.8 oz (86.5 kg)   LMP 09/11/2017 (Approximate)   SpO2 97%   BMI 33.80 kg/m   Physical Exam Vitals and nursing note reviewed.  Constitutional:      General: She is not in acute distress.    Appearance: Normal appearance.  Cardiovascular:     Rate and Rhythm: Normal rate and regular rhythm.     Heart sounds: Normal heart sounds.  Pulmonary:     Effort: Pulmonary effort is normal.      Breath sounds: Normal breath sounds.  Skin:    General: Skin is warm and dry.  Neurological:     General: No focal deficit present.     Mental Status: She is alert. Mental status is at baseline.  Psychiatric:  Mood and Affect: Mood normal.        Behavior: Behavior normal.        Thought Content: Thought content normal.        Judgment: Judgment normal.        Assessment & Plan:   Problem List Items Addressed This Visit     Essential hypertension - Primary (Chronic)   Taking hydrochlorothiazide 25 mg daily (forgot to take today) Denies chest pain, shortness of breath, lower extremity edema, vision changes, headaches.  Monitors blood pressure at home.  Well controlled in office upon recheck despite missing dose of medication today. Refill sent. Follow-up in 3 months for medication management.       Relevant Medications   hydrochlorothiazide (HYDRODIURIL) 25 MG tablet   Chronic pain of right ankle   Pain and swelling > 3 months. No known injury. X-ray ordered. Referral to ortho placed.       Relevant Orders   DG Ankle Complete Right   Ambulatory referral to Orthopedics   Encounter for administration of vaccine   MMR booster due to recent measles outbreak.       Relevant Orders   MMR vaccine subcutaneous (Completed)   Exposure to mold   Had mold exposure in 3 different housing locations. She is finally in a mold free environment. Symptoms have improved. Has appointment with allergist in May. Follow-up with PCP as needed.      Agrees with plan of care discussed.  Questions answered.  Total time face to face: 33 minutes discussing recent mold exposure, high blood pressure, and right ankle.    Return in about 3 months (around 04/12/2024) for HTN.   Novella Olive, FNP

## 2024-01-12 ENCOUNTER — Encounter: Payer: Self-pay | Admitting: Family Medicine

## 2024-01-17 ENCOUNTER — Ambulatory Visit (HOSPITAL_BASED_OUTPATIENT_CLINIC_OR_DEPARTMENT_OTHER): Admitting: Orthopaedic Surgery

## 2024-01-17 DIAGNOSIS — S93421A Sprain of deltoid ligament of right ankle, initial encounter: Secondary | ICD-10-CM

## 2024-01-17 NOTE — Progress Notes (Signed)
 Chief Complaint: Right ankle pain     History of Present Illness:    Janet Richards is a 63 y.o. female presents today with ongoing right ankle pain.  She states that she has sprained her ankle approximately 4-5 times over the course the last several years.  She is having persistent posterior medial and anterior medial based pain.  She has trialed bracing in the past without any relief.  She does note that her toes are more progressively swollen.    PMH/PSH/Family History/Social History/Meds/Allergies:    Past Medical History:  Diagnosis Date   Allergy    Asthma    Chronic pancreatitis (HCC)    Essential hypertension, benign    Menstrual migraine    Periodontitis    Prediabetes 10/17/2014   Primary osteoarthritis of both knees    Sinusitis    Urticaria    Past Surgical History:  Procedure Laterality Date   TUBAL LIGATION  12/26/1987   Social History   Socioeconomic History   Marital status: Married    Spouse name: Not on file   Number of children: 3   Years of education: Not on file   Highest education level: Not on file  Occupational History   Occupation: homemaker  Tobacco Use   Smoking status: Former    Current packs/day: 0.00    Types: Cigarettes    Quit date: 02/05/2006    Years since quitting: 17.9   Smokeless tobacco: Never  Vaping Use   Vaping status: Never Used  Substance and Sexual Activity   Alcohol use: Not Currently   Drug use: No   Sexual activity: Yes    Partners: Male  Other Topics Concern   Not on file  Social History Narrative   ** Merged History Encounter **       Social Drivers of Health   Financial Resource Strain: Low Risk  (04/18/2023)   Received from [facility]   Overall Financial Resource Strain (CARDIA)    Difficulty of Paying Living Expenses: Not hard at all  Food Insecurity: No Food Insecurity (04/18/2023)   Received from [facility]   Hunger Vital Sign    Worried About Running Out of Food in the Last Year:  Never true    Ran Out of Food in the Last Year: Never true  Transportation Needs: No Transportation Needs (04/18/2023)   Received from [facility] - Transportation    Lack of Transportation (Medical): No    Lack of Transportation (Non-Medical): No  Physical Activity: Unknown (04/18/2023)   Received from [facility]   Exercise Vital Sign    Days of Exercise per Week: 0 days    Minutes of Exercise per Session: Not on file  Stress: No Stress Concern Present (04/18/2023)   Received from [facility] - Occupational Stress Questionnaire    Feeling of Stress : Not at all  Social Connections: Socially Integrated (04/18/2023)   Received from [facility]   Social Network    How would you rate your social network (family, work, friends)?: Good participation with social networks   Family History  Problem Relation Age of Onset   Arthritis Mother    Hypertension Mother    CAD Father        MI in his 85s   Allergic rhinitis Father    Urticaria Sister    Colon cancer Sister    Allergic rhinitis Son    Asthma Son  Angioedema Neg Hx    Eczema Neg Hx    Immunodeficiency Neg Hx    Esophageal cancer Neg Hx    Stomach cancer Neg Hx    Rectal cancer Neg Hx    Allergies  Allergen Reactions   Azithromycin Nausea And Vomiting   Clindamycin/Lincomycin Hives    Facial swelling   Erythromycin Hives and Nausea And Vomiting    Has taken azithromycin with no issues in the past.   Rosuvastatin     Other Reaction(s): Myalgia   Current Outpatient Medications  Medication Sig Dispense Refill   azelastine (ASTELIN) 0.1 % nasal spray Place 1 spray into both nostrils 2 (two) times daily.     cefdinir (OMNICEF) 300 MG capsule Take 1 capsule (300 mg total) by mouth 2 (two) times daily. 20 capsule 0   cetirizine (ZYRTEC) 10 MG tablet TAKE ONE TABLET BY MOUTH EVERY DAY 90 tablet 0   doxycycline (VIBRAMYCIN) 100 MG capsule Take 1 capsule (100 mg  total) by mouth 2 (two) times daily. 20 capsule 0   hydrochlorothiazide (HYDRODIURIL) 25 MG tablet Take one tablet (25 mg dose) by mouth daily. 90 tablet 0   ibuprofen (ADVIL) 600 MG tablet Take 1 tablet (600 mg total) by mouth every 6 (six) hours as needed. 30 tablet 0   Multiple Vitamins-Minerals (MULTIVITAMIN ADULT PO) Take by mouth.     Nutritional Supplements (IMMUNOCAL PO) Take by mouth.     No current facility-administered medications for this visit.   No results found.  Review of Systems:   A ROS was performed including pertinent positives and negatives as documented in the HPI.  Physical Exam :   Constitutional: NAD and appears stated age Neurological: Alert and oriented Psych: Appropriate affect and cooperative Last menstrual period 09/11/2017.   Comprehensive Musculoskeletal Exam:    Tenderness about the medial deltoid.  Positive pain with inversion of the foot.  No joint line tenderness about the tibiotalar joint or subtalar joint.  No tenderness about the Achilles.  Distal neurosensory exam is intact   Imaging:   Xray (3 views right ankle): Old appearing ossicle about the medial malleolus   I personally reviewed and interpreted the radiographs.   Assessment and Plan:   63 y.o. female with medial malleolar pain consistent with an old deltoid evulsion injury.  At this time I did discuss that she is persistently painful with 5-6 sprains in the past.  I would like to obtain an MRI to further assess and rule out any type of tibiotalar pathology as well as to assess for medial deltoid ligament  -Plan for an MRI right ankle and follow-up discuss results   I personally saw and evaluated the patient, and participated in the management and treatment plan.  Huel Cote, MD Attending Physician, Orthopedic Surgery  This document was dictated using Dragon voice recognition software. A reasonable attempt at proof reading has been made to minimize errors.

## 2024-01-18 ENCOUNTER — Ambulatory Visit
Admission: EM | Admit: 2024-01-18 | Discharge: 2024-01-18 | Disposition: A | Discharge status: Home / Self Care | Attending: Emergency Medicine | Admitting: Emergency Medicine

## 2024-01-18 ENCOUNTER — Other Ambulatory Visit: Payer: Self-pay

## 2024-01-18 DIAGNOSIS — H60541 Acute eczematoid otitis externa, right ear: Secondary | ICD-10-CM | POA: Diagnosis not present

## 2024-01-18 MED ORDER — FLUOCINOLONE ACETONIDE 0.01 % OT OIL: 5.0000 [drp] | TOPICAL_OIL | Freq: Two times a day (BID) | OTIC | 0 refills | Status: DC | PRN

## 2024-01-18 NOTE — ED Provider Notes (Signed)
 [facility]    CSN: [id number] Arrival date & time: 01/18/24  1954    HISTORY   Chief Complaint  Patient presents with   Otalgia   HPI Janet Richards is a pleasant, 63 y.o. female who presents to urgent care today. Patient complains of pain in her right ear and has noticed that her right eye has been watering more than usual for the past 3 days.  Patient states she has been using Q-tips to clean out her ears because they have been itchy and she thought they were dirty.  Patient states about a week ago she was having sneezing, runny nose and some nasal congestion but this is improved since.  The history is provided by the patient.   Past Medical History:  Diagnosis Date   Allergy    Asthma    Chronic pancreatitis (HCC)    Essential hypertension, benign    Menstrual migraine    Periodontitis    Prediabetes 10/17/2014   Primary osteoarthritis of both knees    Sinusitis    Urticaria    Patient Active Problem List   Diagnosis Date Noted   Encounter for administration of vaccine 01/11/2024   Exposure to mold 01/11/2024   NAFLD (nonalcoholic fatty liver disease) 25/36/6440   Asthma 08/18/2022   Contact dermatitis 08/18/2022   Dermatitis due to drugs and medicines taken internally 08/18/2022   Eczema 08/18/2022   Lateral epicondylitis of elbow 08/18/2022   Low back pain 08/18/2022   Overweight 08/18/2022   History of tubal ligation 09/17/2021   Osteoarthritis of knee 09/17/2021   Vaccine counseling 04/09/2020   Palpitations 03/09/2020   Irritation of ear, bilateral 03/09/2020   Generalized headaches 02/18/2020   GERD (gastroesophageal reflux disease) 02/18/2020   Class 1 obesity due to excess calories with serious comorbidity and body mass index (BMI) of 33.0 to 33.9 in adult 01/19/2020   Dysuria 09/16/2019   Chronic pancreatitis (HCC) 04/10/2019   Environmental allergies 09/12/2018   Mild intermittent asthma without complication 07/31/2018   Dyspepsia  07/31/2018   Sensory neuronopathy 07/29/2018   Post-menopausal bleeding 05/21/2018   DDD (degenerative disc disease), cervical 02/22/2018   Periodontitis 12/15/2017   Vaginal dryness, menopausal 03/01/2017   Abnormal ankle brachial index (ABI) 11/09/2016   Numbness and tingling of both feet 09/02/2016   Ganglion cyst of right foot 07/12/2016   Chronic pain of right ankle 02/24/2016   History of colonic polyps 03/20/2015   Hyperlipidemia 02/20/2015   Other allergic rhinitis 01/14/2015   Prediabetes 10/17/2014   Osteoarthritis of first metatarsophalangeal joint 04/11/2014   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 Authorizing Provider  Fluocinolone Acetonide (DERMOTIC) 0.01 % OIL Place 5 drops in ear(s) 2 (two) times daily as needed (Itching in ears). 01/18/24  Yes  Theadora Rama Scales, PA-C  azelastine (ASTELIN) 0.1 % nasal spray Place 1 spray into both nostrils 2 (two) times daily. 11/15/23   [provider]  cetirizine (ZYRTEC) 10 MG tablet TAKE ONE TABLET BY MOUTH EVERY DAY 03/01/23   Marcine Matar, MD  doxycycline (VIBRAMYCIN) 100 MG capsule Take 1 capsule (100 mg total) by mouth 2 (two) times daily. 12/21/22   Radford Pax, NP  hydrochlorothiazide (HYDRODIURIL) 25 MG tablet Take one tablet (25 mg dose) by mouth daily. 01/11/24   Novella Olive, FNP  ibuprofen (ADVIL) 600 MG tablet Take 1 tablet (600 mg total) by mouth every 6 (six) hours as needed. 09/09/22   Peter Garter, PA  Multiple Vitamins-Minerals  (MULTIVITAMIN ADULT PO) Take by mouth.    [provider]  Nutritional Supplements (IMMUNOCAL PO) Take by mouth.    [provider]    Family History Family History  Problem Relation Age of Onset   Arthritis Mother    Hypertension Mother    CAD Father        MI in his 79s   Allergic rhinitis Father    Urticaria Sister    Colon cancer Sister    Allergic rhinitis Son    Asthma Son    Angioedema Neg Hx    Eczema Neg Hx    Immunodeficiency Neg Hx    Esophageal cancer Neg Hx    Stomach cancer Neg Hx    Rectal cancer Neg Hx    Social History Social History   Tobacco Use   Smoking status: Former    Current packs/day: 0.00    Types: Cigarettes    Quit date: 02/05/2006    Years since quitting: 17.9   Smokeless tobacco: Never  Vaping Use   Vaping status: Never Used  Substance Use Topics   Alcohol use: Not Currently   Drug use: No   Allergies   Azithromycin, Clindamycin/lincomycin, Erythromycin, and Rosuvastatin  Review of Systems Review of Systems Pertinent findings revealed after performing a 14 point review of systems has been noted in the history of present illness.  Physical Exam Vital Signs BP (!) 187/119 (BP Location: Right Arm)   Pulse 92   Temp 98.1 F (36.7 C) (Oral)   Resp 18   LMP 09/11/2017 (Approximate)   SpO2 97%   No data found.  Physical Exam Vitals and nursing note reviewed.  Constitutional:      General: She is not in acute distress.    Appearance: Normal appearance.  HENT:     Head: Normocephalic and atraumatic.     Right Ear: Hearing, tympanic membrane and external ear normal.     Left Ear: Hearing, tympanic membrane, ear canal and external ear normal.     Ears:     Comments: Erythema and mild maceration seen at medial anterior aspect of of right EAC    Nose: Rhinorrhea present. Rhinorrhea is clear.     Right Turbinates: Enlarged and swollen.     Left Turbinates: Enlarged and swollen.  Eyes:     Pupils: Pupils are  equal, round, and reactive to light.  Cardiovascular:     Rate and Rhythm: Normal rate and regular rhythm.  Pulmonary:     Effort: Pulmonary effort is normal.     Breath sounds: Normal breath sounds.  Musculoskeletal:        General: Normal range of motion.     Cervical back: Normal range of motion and neck supple.  Skin:  General: Skin is warm and dry.  Neurological:     General: No focal deficit present.     Mental Status: She is alert and oriented to person, place, and time. Mental status is at baseline.  Psychiatric:        Mood and Affect: Mood normal.        Behavior: Behavior normal.        Thought Content: Thought content normal.        Judgment: Judgment normal.     Visual Acuity Right Eye Distance:   Left Eye Distance:   Bilateral Distance:    Right Eye Near:   Left Eye Near:    Bilateral Near:     UC Couse / Diagnostics / Procedures:     Radiology No results found.  Procedures Procedures (including critical care time) EKG  Pending results:  Labs Reviewed - No data to display  Medications Ordered in UC: Medications - No data to display  UC Diagnoses / Final Clinical Impressions(s)   I have reviewed the triage vital signs and the nursing notes.  Pertinent labs & imaging results that were available during my care of the patient were reviewed by me and considered in my medical decision making (see chart for details).    Final diagnoses:  Acute eczematoid otitis externa of right ear   Patient advised to never put anything in her ear smaller than her elbow, meaning do not use Q-tips to clean her ears.  Patient provided with fluocinolone acetonide oil for relief of ear itching, advised can use in both ears if needed.  Conservative care recommended.  Return precautions advised.  Please see discharge instructions below for details of plan of care as provided to patient. ED Prescriptions     Medication Sig Dispense Auth. Provider   Fluocinolone  Acetonide (DERMOTIC) 0.01 % OIL Place 5 drops in ear(s) 2 (two) times daily as needed (Itching in ears). 20 mL Theadora Rama Scales, PA-C      PDMP not reviewed this encounter.  Pending results:  Labs Reviewed - No data to display    Discharge Instructions      Please read below to learn more about the medications, dosages and frequencies that I recommend to help alleviate your symptoms and to get you feeling better soon:   DermOtic oil (fluocinolone): This is a topical steroid oil that you can use in your ear twice daily to alleviate pain, itching and swelling.   If symptoms have not meaningfully improved in the next 5 to 7 days, please return for repeat evaluation or follow-up with your regular provider.  If symptoms have worsened in the next 3 to 5 days, please go to the emergency room for further evaluation.    Thank you for visiting urgent care today.  We appreciate the opportunity to participate in your care.     Disposition Upon Discharge:  Condition: stable for discharge home  Patient presented with an acute illness with associated systemic symptoms and significant discomfort requiring urgent management. In my opinion, this is a condition that a prudent lay person (someone who possesses an average knowledge of health and medicine) may potentially expect to result in complications if not addressed urgently such as respiratory distress, impairment of bodily function or dysfunction of bodily organs.   Routine symptom specific, illness specific and/or disease specific instructions were discussed with the patient and/or caregiver at length.   As such, the patient has been evaluated and assessed, work-up was performed and treatment  was provided in alignment with urgent care protocols and evidence based medicine.  Patient/parent/caregiver has been advised that the patient may require follow up for further testing and treatment if the symptoms continue in spite of treatment, as  clinically indicated and appropriate.  Patient/parent/caregiver has been advised to return to the [facility] or PCP if no better; to PCP or the Emergency Department if new signs and symptoms develop, or if the current signs or symptoms continue to change or worsen for further workup, evaluation and treatment as clinically indicated and appropriate  The patient will follow up with their current PCP if and as advised. If the patient does not currently have a PCP we will assist them in obtaining one.   The patient may need specialty follow up if the symptoms continue, in spite of conservative treatment and management, for further workup, evaluation, consultation and treatment as clinically indicated and appropriate.  Patient/parent/caregiver verbalized understanding and agreement of plan as discussed.  All questions were addressed during visit.  Please see discharge instructions below for further details of plan.  This office note has been dictated using Teaching laboratory technician.  Unfortunately, this method of dictation can sometimes lead to typographical or grammatical errors.  I apologize for your inconvenience in advance if this occurs.  Please do not hesitate to reach out to me if clarification is needed.      Theadora Rama Scales, [place] 01/19/24 [phone]

## 2024-01-18 NOTE — ED Triage Notes (Signed)
 C/O right ear pain and right eye watering x 3 days.

## 2024-01-18 NOTE — Discharge Instructions (Signed)
 Please read below to learn more about the medications, dosages and frequencies that I recommend to help alleviate your symptoms and to get you feeling better soon:   DermOtic oil (fluocinolone): This is a topical steroid oil that you can use in your ear twice daily to alleviate pain, itching and swelling.   If symptoms have not meaningfully improved in the next 5 to 7 days, please return for repeat evaluation or follow-up with your regular provider.  If symptoms have worsened in the next 3 to 5 days, please go to the emergency room for further evaluation.    Thank you for visiting urgent care today.  We appreciate the opportunity to participate in your care.

## 2024-01-23 ENCOUNTER — Ambulatory Visit: Admission: EM | Admit: 2024-01-23 | Discharge: 2024-01-23 | Disposition: A | Discharge status: Home / Self Care

## 2024-01-23 NOTE — ED Triage Notes (Signed)
 Pt presents to [facility] with co facial pain (jaw, nasal, and otalgia).Pt reports she has also noticed her bp has been elevated and bilateral neuropathy acting up for one week. Pt reports he has been taking tylenol and musinex with minimal improvement.

## 2024-01-25 ENCOUNTER — Encounter (HOSPITAL_BASED_OUTPATIENT_CLINIC_OR_DEPARTMENT_OTHER): Payer: Self-pay | Admitting: Orthopaedic Surgery

## 2024-01-30 ENCOUNTER — Ambulatory Visit (INDEPENDENT_AMBULATORY_CARE_PROVIDER_SITE_OTHER): Admitting: Family Medicine

## 2024-01-30 ENCOUNTER — Encounter: Payer: Self-pay | Admitting: Family Medicine

## 2024-01-30 ENCOUNTER — Ambulatory Visit: Payer: Self-pay

## 2024-01-30 VITALS — BP 133/83 | HR 89 | Temp 98.6°F | Resp 18 | Ht 63.0 in | Wt 191.6 lb

## 2024-01-30 DIAGNOSIS — J0100 Acute maxillary sinusitis, unspecified: Secondary | ICD-10-CM

## 2024-01-30 DIAGNOSIS — K0530 Chronic periodontitis, unspecified: Secondary | ICD-10-CM

## 2024-01-30 HISTORY — DX: Acute maxillary sinusitis, unspecified: J01.00

## 2024-01-30 MED ORDER — CHLORHEXIDINE GLUCONATE 0.12 % MT SOLN: 15.0000 mL | Freq: Two times a day (BID) | OROMUCOSAL | 1 refills | Status: DC

## 2024-01-30 MED ORDER — CEFDINIR 300 MG PO CAPS: 300.0000 mg | ORAL_CAPSULE | Freq: Two times a day (BID) | ORAL | 0 refills | Status: DC

## 2024-01-30 NOTE — Patient Instructions (Signed)
 You were prescribed an antibiotic today. Please complete the full course.  Acetaminophen or ibuprofen for fever according to package instructions, do not exceed recommended doses.  Adequate fluids to avoid dehydration. Lots of rest while you are recovering.  Follow-up if your symptoms do not improve.

## 2024-01-30 NOTE — Telephone Encounter (Signed)
 Chief Complaint: Facial swelling Symptoms: pain, "lockjaw", HA, ankle swelling Frequency: Intermittent x 6 months Pertinent Negatives: Patient denies  Disposition: [] ED /[] Urgent Care (no appt availability in office) / [] Appointment(In office/virtual)/ []  [facility]/ [] Home Care/ [] Refused Recommended Disposition /[] [facility]/ []  Follow-up with PCP Additional Notes: Pt called in reporting intermittent facial, jaw and ear swelling on the right with occasional swelling to the left. Pt reports pain at 9/10 when these episodes occur. Pt notes moving 4 times in the last 8 months due to repeated mold/mildew exposure in rentals. Pt reports she took 2 antibiotics from her sons script yesterday and notes she feels "different" and believes it is a bacterial infection in her gums and she is needing additional antibiotics. Pt has been unable to get to the dentist due to ongoing moving and other illnesses related to mold exposure. Pt states she believes she has fever as she often feels "hot and cold" when others don't. Called CAL per appt notes in chart, advised okay to schedule pt today. This RN educated pt on home care, new-worsening symptoms, when to call back/seek emergent care. Pt verbalized understanding and agrees to plan.    Copied from CRM [id number]. Topic: Clinical - Red Word Triage >> Jan 30, 2024  9:34 AM Elle L wrote: Red Word that prompted transfer to Nurse Triage: The patient states that she has a bacteria infection. The patient's mouth and jaw is swollen and painful and it is putting pressure on her nose. It is making her neuropathy worsen and her feet swell and hurt. Her head hurts and her blood pressure is rising. The patient is requesting to see if she should go to the office, hospital, or Urgent Care. Reason for Disposition  Swelling of both lower legs (i.e., bilateral pedal edema)  Answer Assessment - Initial Assessment Questions 1. ONSET: "When did the swelling  start?" (e.g., minutes, hours, days)     Intermittent 2. LOCATION: "What part of the face is swollen?"     Right side primarily, occasionally on the left jaw, ear  4. ITCHING: "Is there any itching?" If Yes, ask: "How much?"   (Scale 1-10; mild, moderate or severe)     None 5. PAIN: "Is the swelling painful to touch?" If Yes, ask: "How painful is it?"   (Scale 1-10; mild, moderate or severe)   - NONE (0): no pain   - MILD (1-3): doesn't interfere with normal activities    - MODERATE (4-7): interferes with normal activities or awakens from sleep    - SEVERE (8-10): excruciating pain, unable to do any normal activities      9/10, Tylenol, ASA, Ibuprofen, Tumeric 6. FEVER: "Do you have a fever?" If Yes, ask: "What is it, how was it measured, and when did it start?"      "Hot one minute, cold the next" 7. CAUSE: "What do you think is causing the face swelling?"     Pt believes it is a bacterial infection  9. OTHER SYMPTOMS: "Do you have any other symptoms?" (e.g., toothache, leg swelling)     Pain  Protocols used: Face Swelling-A-AH

## 2024-01-30 NOTE — Telephone Encounter (Signed)
 On schedule to be seen today.

## 2024-01-30 NOTE — Assessment & Plan Note (Addendum)
 Exposed to mold frequently in past months. Maxillary sinus pain with palpation. Unsure of fever due to menopause.  Symptoms are consistent with sinusitis.  Cefdinir 300 mg BID for 10 days due to reports that amoxicillin is not effective for her.  Sinus rinses.

## 2024-01-30 NOTE — Progress Notes (Signed)
 Established Patient Office Visit  Subjective   Patient ID: Janet Richards, female    DOB: Mar 31, 1961  Age: 63 y.o. MRN: [id number]  Chief Complaint  Patient presents with   gum inflammation    Patient is here today due to on and off swelling , headaches , ears, gums, and mouth pain.     HPI  "I have a bacteria infection in my mouth" Has history of  periodontitis. Has chlorhexidine mouth wash at home.  Needs refill today. Has not seen dentist since April 2024. Has been working hard to find a Education officer, community in this area and has not been successful due to insurance coverage and time to next available appointments.  Needs to get her dental cleaning every 3-4 months to keep this problem  under control.    Sinus pain and pressure with ear pressure. Symptoms present for 3 months or more. Has been dealing with mold and mildew in her homes which could be responsible for infection. Has been using sinus rinses.      Review of Systems  Constitutional:  Negative for chills and fever.  HENT:  Positive for ear pain and sinus pain. Negative for congestion.   Neurological:  Positive for headaches.      Objective:     BP 133/83   Pulse 89   Temp 98.6 F (37 C) (Oral)   Resp 18   Ht 5\' 3"  (1.6 m)   Wt 191 lb 9.6 oz (86.9 kg)   LMP 09/11/2017 (Approximate)   SpO2 100%   BMI 33.94 kg/m    Physical Exam Vitals and nursing note reviewed.  Constitutional:      General: She is not in acute distress.    Appearance: Normal appearance. She is not ill-appearing.  HENT:     Right Ear: Tympanic membrane normal.     Left Ear: Tympanic membrane normal.     Nose:     Right Sinus: Maxillary sinus tenderness present.     Left Sinus: Maxillary sinus tenderness present.     Mouth/Throat:     Dentition: Gingival swelling (left side of upper gums) present.     Pharynx: Uvula midline. No oropharyngeal exudate or posterior oropharyngeal erythema.  Cardiovascular:     Rate and Rhythm: Normal  rate and regular rhythm.     Heart sounds: Normal heart sounds.  Pulmonary:     Effort: Pulmonary effort is normal.     Breath sounds: Normal breath sounds.  Skin:    General: Skin is warm and dry.  Neurological:     General: No focal deficit present.     Mental Status: She is alert. Mental status is at baseline.  Psychiatric:        Mood and Affect: Mood normal.        Behavior: Behavior normal.        Thought Content: Thought content normal.        Judgment: Judgment normal.     No results found for any visits on 01/30/24.    The 10-year ASCVD risk score (Arnett DK, et al., 2019) is: 5.7%    Assessment & Plan:   Problem List Items Addressed This Visit     Periodontitis   Chronic condition. Erythema on upper left gum area.  Has not been able to find dentist for routine cleanings since relocating to the area. Last dental cleaning April 2024.  Has tried multiple dentist without success. She will continue to work on this. Chlorhexidine 0.12% solution  rinses BID.        Relevant Medications   chlorhexidine (PERIDEX) 0.12 % solution   Acute non-recurrent maxillary sinusitis - Primary   Exposed to mold frequently in past months. Maxillary sinus pain with palpation. Unsure of fever due to menopause.  Symptoms are consistent with sinusitis.  Cefdinir 300 mg BID for 10 days due to reports that amoxicillin is not effective for her.  Sinus rinses.       Relevant Medications   cefdinir (OMNICEF) 300 MG capsule  Agrees with plan of care discussed.  Questions answered.   Return if symptoms worsen or fail to improve.    Novella Olive, FNP

## 2024-01-30 NOTE — Assessment & Plan Note (Addendum)
 Chronic condition. Erythema on upper left gum area.  Has not been able to find dentist for routine cleanings since relocating to the area. Last dental cleaning April 2024.  Has tried multiple dentist without success. She will continue to work on this. Chlorhexidine 0.12% solution rinses BID.

## 2024-01-31 ENCOUNTER — Telehealth: Payer: Self-pay

## 2024-01-31 NOTE — Telephone Encounter (Signed)
 Copied from CRM [id number]. Topic: General - Call Back - No Documentation >> Jan 31, 2024  9:53 AM Janet Richards wrote: Reason for CRM: patient is calling saying she has question about her appt she had yesterday [phone]

## 2024-02-01 ENCOUNTER — Ambulatory Visit
Admission: RE | Admit: 2024-02-01 | Discharge: 2024-02-01 | Disposition: A | Discharge status: Home / Self Care | Source: Ambulatory Visit | Attending: Orthopaedic Surgery | Admitting: Orthopaedic Surgery

## 2024-02-01 DIAGNOSIS — M25571 Pain in right ankle and joints of right foot: Secondary | ICD-10-CM | POA: Diagnosis not present

## 2024-02-01 DIAGNOSIS — M76821 Posterior tibial tendinitis, right leg: Secondary | ICD-10-CM | POA: Diagnosis not present

## 2024-02-01 DIAGNOSIS — M25471 Effusion, right ankle: Secondary | ICD-10-CM | POA: Diagnosis not present

## 2024-02-01 DIAGNOSIS — S93421A Sprain of deltoid ligament of right ankle, initial encounter: Secondary | ICD-10-CM

## 2024-02-12 ENCOUNTER — Other Ambulatory Visit: Payer: Self-pay | Admitting: Orthopaedic Surgery

## 2024-02-12 ENCOUNTER — Ambulatory Visit (INDEPENDENT_AMBULATORY_CARE_PROVIDER_SITE_OTHER): Admitting: Student

## 2024-02-12 ENCOUNTER — Encounter (HOSPITAL_BASED_OUTPATIENT_CLINIC_OR_DEPARTMENT_OTHER): Payer: Self-pay | Admitting: Student

## 2024-02-12 ENCOUNTER — Ambulatory Visit (HOSPITAL_BASED_OUTPATIENT_CLINIC_OR_DEPARTMENT_OTHER): Admitting: Orthopaedic Surgery

## 2024-02-12 ENCOUNTER — Ambulatory Visit: Payer: Self-pay | Admitting: Orthopaedic Surgery

## 2024-02-12 DIAGNOSIS — M25371 Other instability, right ankle: Secondary | ICD-10-CM | POA: Diagnosis not present

## 2024-02-12 DIAGNOSIS — S93421A Sprain of deltoid ligament of right ankle, initial encounter: Secondary | ICD-10-CM | POA: Diagnosis not present

## 2024-02-12 MED ORDER — IBUPROFEN 800 MG PO TABS: 800.0000 mg | ORAL_TABLET | Freq: Three times a day (TID) | ORAL | 0 refills | Status: AC

## 2024-02-12 MED ORDER — OXYCODONE HCL 5 MG PO TABS: 5.0000 mg | ORAL_TABLET | ORAL | 0 refills | Status: DC | PRN

## 2024-02-12 MED ORDER — ASPIRIN 325 MG PO TBEC: 325.0000 mg | DELAYED_RELEASE_TABLET | Freq: Every day | ORAL | 0 refills | Status: DC

## 2024-02-12 NOTE — Progress Notes (Unsigned)
 Chief Complaint: Right ankle pain     History of Present Illness:   02/12/24: Patient presents today for MRI follow-up of the right ankle.  Overall she reports little changes in her symptoms.  She continues to have pain off and on which is located over the medial ankle.  No recent injuries or falls.   Janet Richards is a 63 y.o. female presents today with ongoing right ankle pain.  She states that she has sprained her ankle approximately 4-5 times over the course the last several years.  She is having persistent posterior medial and anterior medial based pain.  She has trialed bracing in the past without any relief.  She does note that her toes are more progressively swollen.   PMH/PSH/Family History/Social History/Meds/Allergies:    Past Medical History:  Diagnosis Date   Allergy    Asthma    Chronic pancreatitis (HCC)    Essential hypertension, benign    Menstrual migraine    Periodontitis    Prediabetes 10/17/2014   Primary osteoarthritis of both knees    Sinusitis    Urticaria    Past Surgical History:  Procedure Laterality Date   TUBAL LIGATION  12/26/1987   Social History   Socioeconomic History   Marital status: Married    Spouse name: Not on file   Number of children: 3   Years of education: Not on file   Highest education level: Not on file  Occupational History   Occupation: homemaker  Tobacco Use   Smoking status: Former    Current packs/day: 0.00    Types: Cigarettes    Quit date: 02/05/2006    Years since quitting: 18.0   Smokeless tobacco: Never  Vaping Use   Vaping status: Never Used  Substance and Sexual Activity   Alcohol use: Not Currently   Drug use: No   Sexual activity: Yes    Partners: Male  Other Topics Concern   Not on file  Social History Narrative   ** Merged History Encounter **       Social Drivers of Health   Financial Resource Strain: Low Risk  (04/18/2023)   Received from [facility]   Overall Financial Resource  Strain (CARDIA)    Difficulty of Paying Living Expenses: Not hard at all  Food Insecurity: No Food Insecurity (04/18/2023)   Received from [facility]   Hunger Vital Sign    Worried About Running Out of Food in the Last Year: Never true    Ran Out of Food in the Last Year: Never true  Transportation Needs: No Transportation Needs (04/18/2023)   Received from [facility] - Transportation    Lack of Transportation (Medical): No    Lack of Transportation (Non-Medical): No  Physical Activity: Unknown (04/18/2023)   Received from [facility]   Exercise Vital Sign    Days of Exercise per Week: 0 days    Minutes of Exercise per Session: Not on file  Stress: No Stress Concern Present (04/18/2023)   Received from [facility] - Occupational Stress Questionnaire    Feeling of Stress : Not at all  Social Connections: Socially Integrated (04/18/2023)   Received from [facility]   Social Network    How would you rate your social network (family, work, friends)?: Good participation with social networks   Family History  Problem Relation Age of Onset   Arthritis Mother    Hypertension Mother  CAD Father        MI in his 65s   Allergic rhinitis Father    Urticaria Sister    Colon cancer Sister    Allergic rhinitis Son    Asthma Son    Angioedema Neg Hx    Eczema Neg Hx    Immunodeficiency Neg Hx    Esophageal cancer Neg Hx    Stomach cancer Neg Hx    Rectal cancer Neg Hx    Allergies  Allergen Reactions   Azithromycin Nausea And Vomiting   Clindamycin/Lincomycin Hives    Facial swelling   Erythromycin Hives and Nausea And Vomiting    Has taken azithromycin with no issues in the past.   Rosuvastatin     Other Reaction(s): Myalgia   Current Outpatient Medications  Medication Sig Dispense Refill   azelastine (ASTELIN) 0.1 % nasal spray Place 1 spray into both nostrils 2 (two) times daily.     cefdinir (OMNICEF) 300 MG  capsule Take 1 capsule (300 mg total) by mouth 2 (two) times daily. 20 capsule 0   cetirizine (ZYRTEC) 10 MG tablet TAKE ONE TABLET BY MOUTH EVERY DAY 90 tablet 0   chlorhexidine (PERIDEX) 0.12 % solution Use as directed 15 mLs in the mouth or throat 2 (two) times daily. 473 mL 1   doxycycline (VIBRAMYCIN) 100 MG capsule Take 1 capsule (100 mg total) by mouth 2 (two) times daily. 20 capsule 0   Fluocinolone Acetonide (DERMOTIC) 0.01 % OIL Place 5 drops in ear(s) 2 (two) times daily as needed (Itching in ears). 20 mL 0   hydrochlorothiazide (HYDRODIURIL) 25 MG tablet Take one tablet (25 mg dose) by mouth daily. 90 tablet 0   ibuprofen (ADVIL) 600 MG tablet Take 1 tablet (600 mg total) by mouth every 6 (six) hours as needed. 30 tablet 0   Multiple Vitamins-Minerals (MULTIVITAMIN ADULT PO) Take by mouth.     Nutritional Supplements (IMMUNOCAL PO) Take by mouth.     No current facility-administered medications for this visit.   No results found.  Review of Systems:   A ROS was performed including pertinent positives and negatives as documented in the HPI.  Physical Exam :   Constitutional: NAD and appears stated age Neurological: Alert and oriented Psych: Appropriate affect and cooperative Last menstrual period 09/11/2017.   Comprehensive Musculoskeletal Exam:    Right ankle exam demonstrates tenderness with palpation medially over the deltoid ligament.  Full active range of motion with dorsiflexion and plantarflexion.  Pain exacerbated with passive inversion and eversion of the ankle.  Negative anterior drawer.  Dorsalis pedis 2+.  Neurosensory exam intact.  Imaging:   MRI right ankle: Chronic appearing tear within the deltoid ligament noted medially.  Cartilage loss noted within the medial tibiotalar joint.   I personally reviewed and interpreted the radiographs.   Assessment and Plan:   63 y.o. female with chronic medial right ankle pain.  MRI reviewed today does show concern  regarding the integrity of the deltoid ligament, which could likely be contributing to her ongoing ankle instability.  At this point I would like her to follow-up with Dr. Steward Drone for further discussion of treatment options, particularly to see if she would be a good candidate for a [employer] officer, trade [employer].     I personally saw and evaluated the patient, and participated in the management and treatment plan.   Hazle Nordmann, PA-C Orthopedics

## 2024-02-16 ENCOUNTER — Other Ambulatory Visit: Payer: Self-pay | Admitting: Family Medicine

## 2024-02-16 DIAGNOSIS — J0100 Acute maxillary sinusitis, unspecified: Secondary | ICD-10-CM

## 2024-02-20 ENCOUNTER — Ambulatory Visit: Admitting: Family Medicine

## 2024-02-21 ENCOUNTER — Ambulatory Visit (INDEPENDENT_AMBULATORY_CARE_PROVIDER_SITE_OTHER): Admitting: Family Medicine

## 2024-02-21 ENCOUNTER — Encounter: Payer: Self-pay | Admitting: Family Medicine

## 2024-02-21 ENCOUNTER — Ambulatory Visit: Admitting: Family Medicine

## 2024-02-21 VITALS — BP 133/82 | HR 92 | Temp 98.4°F | Resp 18 | Ht 63.0 in | Wt 189.7 lb

## 2024-02-21 DIAGNOSIS — R9431 Abnormal electrocardiogram [ECG] [EKG]: Secondary | ICD-10-CM

## 2024-02-21 DIAGNOSIS — R7303 Prediabetes: Secondary | ICD-10-CM | POA: Diagnosis not present

## 2024-02-21 DIAGNOSIS — Z01818 Encounter for other preprocedural examination: Secondary | ICD-10-CM

## 2024-02-21 HISTORY — DX: Encounter for other preprocedural examination: Z01.818

## 2024-02-21 HISTORY — DX: Abnormal electrocardiogram (ECG) (EKG): R94.31

## 2024-02-21 NOTE — Assessment & Plan Note (Signed)
 History of pre-diabetes. A1C today.

## 2024-02-21 NOTE — Assessment & Plan Note (Signed)
 On 06/29/22 A1C 6.0 Recheck today.

## 2024-02-21 NOTE — Progress Notes (Signed)
   Established Patient Office Visit  Subjective   Patient ID: Janet Richards, female    DOB: 01-29-1961  Age: 63 y.o. MRN: [id number]  Chief Complaint  Patient presents with   surgical clearance    Patient is here for a surgical clearance for surgery on right ankle    HPI  Presents today for labs and EKG for upcoming surgery on right ankle. Dr. Steward Drone will be performing this surgery.  Cardiovascular history includes: hypertension,  She has been started on Aspirin 325 mg per ortho.  Exercise tolerance: able to climb stairs without shortness of breath, however, endorses some random shortness of breath that just occurs.  Functional status: independent with ADLs and mobility.  Endorses some chest pain when lying on left side. Endorses some shortness of breath which she attributes to lack of exercise. Will send to cardiology for evaluation.  Quit smoking in 2007.   Review of Systems  Respiratory:  Positive for shortness of breath.   Cardiovascular:  Positive for chest pain.      Objective:     BP 133/82   Pulse 92   Temp 98.4 F (36.9 C) (Oral)   Resp 18   Ht 5\' 3"  (1.6 m)   Wt 189 lb 11.2 oz (86 kg)   LMP 09/11/2017 (Approximate)   SpO2 100%   BMI 33.60 kg/m  BP Readings from Last 3 Encounters:  02/21/24 133/82  01/30/24 133/83  01/23/24 (!) 156/72      Physical Exam Vitals and nursing note reviewed.  Constitutional:      General: She is not in acute distress.    Appearance: Normal appearance. She is obese.  Cardiovascular:     Rate and Rhythm: Regular rhythm.     Heart sounds: Normal heart sounds.  Pulmonary:     Effort: Pulmonary effort is normal.     Breath sounds: Normal breath sounds.  Skin:    General: Skin is warm and dry.  Neurological:     General: No focal deficit present.     Mental Status: She is alert. Mental status is at baseline.  Psychiatric:        Mood and Affect: Mood normal.        Behavior: Behavior normal.        Thought  Content: Thought content normal.        Judgment: Judgment normal.      No results found for any visits on 02/21/24.     The 10-year ASCVD risk score (Arnett DK, et al., 2019) is: 5.7%    Assessment & Plan:   Problem List Items Addressed This Visit     Prediabetes   On 06/29/22 A1C 6.0 Recheck today.        Relevant Orders   Hgb A1c w/o eAG   Pre-op evaluation - Primary   Relevant Orders   CBC with Differential/Platelet   Comprehensive metabolic panel with GFR   Hgb W0J w/o eAG   EKG 12-Lead   Ambulatory referral to Cardiology   Abnormal EKG   EKG with NSR Left posterior block Inferior infarct, age undetermined. Endorsing chest pain and random shortness of breath. Urgent cardiology referral placed for pre-op evaluation.       Relevant Orders   Ambulatory referral to Cardiology  Agrees with plan of care discussed.  Questions answered.   Return for to be determined .    Novella Olive, FNP

## 2024-02-21 NOTE — Assessment & Plan Note (Signed)
 EKG with NSR Left posterior block Inferior infarct, age undetermined. Endorsing chest pain and random shortness of breath. Urgent cardiology referral placed for pre-op evaluation.

## 2024-02-22 ENCOUNTER — Other Ambulatory Visit: Payer: Self-pay | Admitting: Family Medicine

## 2024-02-22 ENCOUNTER — Encounter: Payer: Self-pay | Admitting: Family Medicine

## 2024-02-22 DIAGNOSIS — E119 Type 2 diabetes mellitus without complications: Secondary | ICD-10-CM | POA: Insufficient documentation

## 2024-02-22 HISTORY — DX: Type 2 diabetes mellitus without complications: E11.9

## 2024-02-22 LAB — COMPREHENSIVE METABOLIC PANEL WITH GFR
ALT: 26 IU/L (ref 0–32)
AST: 20 IU/L (ref 0–40)
Albumin: 4.7 g/dL (ref 3.8–4.9)
Alkaline Phosphatase: 100 IU/L (ref 44–121)
BUN/Creatinine Ratio: 15 (ref 12–28)
BUN: 15 mg/dL (ref 8–27)
Bilirubin Total: 0.3 mg/dL (ref 0.0–1.2)
CO2: 28 mmol/L (ref 20–29)
Calcium: 9.9 mg/dL (ref 8.7–10.3)
Chloride: 98 mmol/L (ref 96–106)
Creatinine, Ser: 0.99 mg/dL (ref 0.57–1.00)
Globulin, Total: 2.5 g/dL (ref 1.5–4.5)
Glucose: 88 mg/dL (ref 70–99)
Potassium: 4.1 mmol/L (ref 3.5–5.2)
Sodium: 141 mmol/L (ref 134–144)
Total Protein: 7.2 g/dL (ref 6.0–8.5)
eGFR: 65 mL/min/{1.73_m2} (ref >59)

## 2024-02-22 LAB — CBC WITH DIFFERENTIAL/PLATELET
Basophils Absolute: 0 10*3/uL (ref 0.0–0.2)
Basos: 0 %
EOS (ABSOLUTE): 0.3 10*3/uL (ref 0.0–0.4)
Eos: 5 %
Hematocrit: 40.9 % (ref 34.0–46.6)
Hemoglobin: 13.6 g/dL (ref 11.1–15.9)
Immature Grans (Abs): 0 10*3/uL (ref 0.0–0.1)
Immature Granulocytes: 0 %
Lymphocytes Absolute: 3 10*3/uL (ref 0.7–3.1)
Lymphs: 48 %
MCH: 30.2 pg (ref 26.6–33.0)
MCHC: 33.3 g/dL (ref 31.5–35.7)
MCV: 91 fL (ref 79–97)
Monocytes Absolute: 0.4 10*3/uL (ref 0.1–0.9)
Monocytes: 6 %
Neutrophils Absolute: 2.6 10*3/uL (ref 1.4–7.0)
Neutrophils: 41 %
Platelets: 385 10*3/uL (ref 150–450)
RBC: 4.5 x10E6/uL (ref 3.77–5.28)
RDW: 13.1 % (ref 11.7–15.4)
WBC: 6.3 10*3/uL (ref 3.4–10.8)

## 2024-02-22 LAB — HGB A1C W/O EAG: Hgb A1c MFr Bld: 6.5 % — ABNORMAL HIGH (ref 4.8–5.6)

## 2024-02-22 MED ORDER — METFORMIN HCL 500 MG PO TABS: 500.0000 mg | ORAL_TABLET | Freq: Two times a day (BID) | ORAL | 0 refills | Status: DC

## 2024-02-27 ENCOUNTER — Other Ambulatory Visit: Payer: Self-pay | Admitting: Family Medicine

## 2024-02-27 DIAGNOSIS — K0530 Chronic periodontitis, unspecified: Secondary | ICD-10-CM

## 2024-02-28 ENCOUNTER — Ambulatory Visit: Admitting: Family Medicine

## 2024-02-29 ENCOUNTER — Other Ambulatory Visit: Payer: Self-pay

## 2024-02-29 DIAGNOSIS — Z01810 Encounter for preprocedural cardiovascular examination: Secondary | ICD-10-CM | POA: Insufficient documentation

## 2024-02-29 DIAGNOSIS — L509 Urticaria, unspecified: Secondary | ICD-10-CM | POA: Insufficient documentation

## 2024-02-29 DIAGNOSIS — I10 Essential (primary) hypertension: Secondary | ICD-10-CM | POA: Insufficient documentation

## 2024-02-29 DIAGNOSIS — T7840XA Allergy, unspecified, initial encounter: Secondary | ICD-10-CM | POA: Insufficient documentation

## 2024-02-29 DIAGNOSIS — Z124 Encounter for screening for malignant neoplasm of cervix: Secondary | ICD-10-CM | POA: Insufficient documentation

## 2024-03-05 ENCOUNTER — Ambulatory Visit: Attending: Cardiology | Admitting: Cardiology

## 2024-03-05 ENCOUNTER — Encounter: Payer: Self-pay | Admitting: Cardiology

## 2024-03-05 VITALS — BP 146/86 | HR 87 | Ht 62.6 in | Wt 190.0 lb

## 2024-03-05 DIAGNOSIS — Z01810 Encounter for preprocedural cardiovascular examination: Secondary | ICD-10-CM | POA: Diagnosis not present

## 2024-03-05 NOTE — Patient Instructions (Signed)
 Medication Instructions:  Your physician recommends that you continue on your current medications as directed. Please refer to the Current Medication list given to you today.  *If you need a refill on your cardiac medications before your next appointment, please call your pharmacy*  Lab Work: None If you have labs (blood work) drawn today and your tests are completely normal, you will receive your results only by: MyChart Message (if you have MyChart) OR A paper copy in the mail If you have any lab test that is abnormal or we need to change your treatment, we will call you to review the results.  Testing/Procedures: Your physician has requested that you have an echocardiogram. Echocardiography is a painless test that uses sound waves to create images of your heart. It provides your doctor with information about the size and shape of your heart and how well your heart's chambers and valves are working. This procedure takes approximately one hour. There are no restrictions for this procedure. Please do NOT wear cologne, perfume, aftershave, or lotions (deodorant is allowed). Please arrive 15 minutes prior to your appointment time.  Please note: We ask at that you not bring children with you during ultrasound (echo/ vascular) testing. Due to room size and safety concerns, children are not allowed in the ultrasound rooms during exams. Our front office staff cannot provide observation of children in our lobby area while testing is being conducted. An adult accompanying a patient to their appointment will only be allowed in the ultrasound room at the discretion of the ultrasound technician under special circumstances. We apologize for any inconvenience.    [facility] at [facility] [address] [place], [place] [address] Phone: [phone]    Please arrive 15 minutes prior to your appointment time for registration and insurance purposes.  The test will take  approximately 3 to 4 hours to complete; you may bring reading material.  If someone comes with you to your appointment, they will need to remain in the main lobby due to limited space in the testing area. **If you are pregnant or breastfeeding, please notify the nuclear lab prior to your appointment**  How to prepare for your Myocardial Perfusion Test: Do not eat or drink 3 hours prior to your test, except you may have water. Do not consume products containing caffeine (regular or decaffeinated) 12 hours prior to your test. (ex: coffee, chocolate, sodas, tea). Do bring a list of your current medications with you.  If not listed below, you may take your medications as normal. Do wear comfortable clothes (no dresses or overalls) and walking shoes, tennis shoes preferred (No heels or open toe shoes are allowed). Do NOT wear cologne, perfume, aftershave, or lotions (deodorant is allowed). If these instructions are not followed, your test will have to be rescheduled.  Please report to [address] ([facility]), 2nd Floor, for your test.  If you have questions or concerns about your appointment, you can call the Nuclear Lab at [phone].  If you cannot keep your appointment, please provide 24 hours notification to the Nuclear Lab, to avoid a possible $50 charge to your account.   Follow-Up: At [facility], you and your health needs are our priority.  As part of our continuing mission to provide you with exceptional heart care, our providers are all part of one team.  This team includes your primary Cardiologist (physician) and Advanced Practice Providers or APPs (Physician Assistants and  Nurse Practitioners) who all work together to provide you with the care you need, when you need it.  Your next appointment:   1 year(s)  Provider:   Hillis Lu, MD    We recommend signing up for the patient portal called "MyChart".  Sign up  information is provided on this After Visit Summary.  MyChart is used to connect with patients for Virtual Visits (Telemedicine).  Patients are able to view lab/test results, encounter notes, upcoming appointments, etc.  Non-urgent messages can be sent to your provider as well.   To learn more about what you can do with MyChart, go to [email or web address].   Other Instructions None

## 2024-03-05 NOTE — Progress Notes (Signed)
 Cardiology Office Note:    Date:  03/05/2024   ID:  Janet Richards, DOB 30-Mar-1961, MRN [id number]  PCP:  Mickiel Albany, FNP  Cardiologist:  Nelia Balzarine, MD   Referring MD: Mickiel Albany, FNP    ASSESSMENT:    1. Preop cardiovascular exam    PLAN:    In order of problems listed above:  Primary prevention stressed with the patient.  Importance of compliance with diet medication stressed and patient verbalized standing. Dyspnea on exertion: Patient leads a sedentary lifestyle because of orthopedic issues and I recommended Lexiscan sestamibi for assessment for an ischemic substrate.  She is agreeable. Essential hypertension: Blood pressure is stable and diet was emphasized.  Lifestyle modification urged. Cardiac murmur: Echocardiogram will be done to assess murmur heard on auscultation. Obesity: Weight reduction stressed diet emphasized and she promises to do better Patient will be seen in follow-up appointment in 6 months or earlier if the patient has any concerns.    Medication Adjustments/Labs and Tests Ordered: Current medicines are reviewed at length with the patient today.  Concerns regarding medicines are outlined above.  Orders Placed This Encounter  Procedures   EKG 12-Lead   No orders of the defined types were placed in this encounter.    History of Present Illness:    Janet Richards is a 63 y.o. female who is being seen today for the evaluation of dyspnea on exertion at the request of [place] Chriss Coup, FNP.  Patient is a pleasant 63 year old female.  She has past medical history of essential hypertension, diet-controlled diabetes mellitus and obesity.  She leads a sedentary lifestyle because of orthopedic issues involving her ankle.  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  She mentions to me that she has some dyspnea on exertion and needs to be evaluated.  At the time of my evaluation, the patient is alert awake oriented and in no distress.  No chest  pain.  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  Patient instructed by provider to lose weight before tubal reversal.     Last Assessment & Plan:   Formatting of this note might be different from the original.   Obesity is complicated by hyperlipidemia, hypertension and prediabetes.     Continue healthy cardiac diet and exercise.     Contact dermatitis 08/18/2022   DDD (degenerative disc disease), cervical 02/22/2018   Dermatitis due to drugs and medicines taken internally 08/18/2022   Dyspepsia 07/31/2018   Dysuria 09/16/2019   Eczema 08/18/2022   Ptd prescribed elidel.     Encounter for screening for metabolic disorder 01/11/2024   Environmental allergies 09/12/2018   Mold, trees, dust mites, mildew. 2019     Essential hypertension 03/01/2013   Essential hypertension, benign    Exposure to mold 01/11/2024   Ganglion cyst of right foot 07/12/2016   Generalized headaches 02/18/2020   GERD (gastroesophageal reflux disease) 02/18/2020   History of colonic polyps 03/20/2015  May 2016 diverticulosis single 5mm polyp repeat 03/2020     History of tubal ligation 09/17/2021   Hyperlipidemia 02/20/2015   AHA 10 year risk of 3.5% April 2016  No access to HDL total cholesterol 238 96/0454. Pt would like to continue lifestyle changes and recheck in 6 months to determine statin usage.      Irritation of ear, bilateral 03/09/2020   Lateral epicondylitis of elbow 08/18/2022   Pth has been using rt hand has tenosynovitis radial aspect of hand  and also epicondylitis of rt elbow.,  Tendero on compression of epicondyles.  Pt has been using rt side instead of left having had a sprain left wrist at work.     Low back pain 08/18/2022   Mild intermittent asthma without complication 07/31/2018   NAFLD (nonalcoholic fatty liver disease) 09/81/1914   No blood products 03/28/2013   [employer]: Signed papers scanned May 2014 into EMR     Numbness and tingling of both feet 09/02/2016   Osteoarthritis of first metatarsophalangeal joint 04/11/2014   Osteoarthritis of knee 09/17/2021   Other allergic rhinitis 01/14/2015   Overweight 08/18/2022   Patient instructed by provider to lose weight before tubal reversal.     Palpitations 03/09/2020   Periodontitis    Post-menopausal bleeding 05/21/2018   Pre-op evaluation 02/21/2024   Prediabetes 10/17/2014   Preop cardiovascular exam    Primary osteoarthritis of both knees    Sensory neuronopathy 07/29/2018   Type 2 diabetes mellitus without complication, without long-term current use of insulin (HCC) 02/22/2024   Urticaria    Vaccine counseling 04/09/2020   Vaginal dryness, menopausal 03/01/2017    Past Surgical History:  Procedure Laterality Date   TUBAL LIGATION  12/26/1987    Current Medications: Current Meds  Medication Sig   aspirin  EC 325 MG tablet Take 1 tablet (325 mg total) by mouth daily.   azelastine  (ASTELIN ) 0.1 % nasal spray Place 1 spray into both nostrils as needed for rhinitis or allergies.   cetirizine  (ZYRTEC ) 10 MG tablet TAKE ONE TABLET BY MOUTH EVERY DAY   chlorhexidine  (PERIDEX ) 0.12 % solution Use as directed 15 mLs in the mouth or throat 2 (two) times daily.   Fluocinolone  Acetonide (DERMOTIC ) 0.01 % OIL Place 5 drops in ear(s) 2 (two) times daily as needed (Itching in ears).   hydrochlorothiazide  (HYDRODIURIL ) 25 MG tablet Take one tablet (25 mg dose) by mouth daily.   ibuprofen  (ADVIL ) 600 MG tablet Take 1 tablet (600 mg total) by mouth every 6 (six)  hours as needed.   Nutritional Supplements (IMMUNOCAL PO) Take 1 tablet by mouth daily.   oxyCODONE  (ROXICODONE ) 5 MG immediate release tablet Take 1 tablet (5 mg total) by mouth every 4 (four) hours as needed for severe pain (pain score 7-10) or breakthrough pain.   PANTOPRAZOLE  SODIUM PO Take 100 mg by mouth as needed (reflux).     Allergies:   Azithromycin , Clindamycin /lincomycin, Erythromycin, and Rosuvastatin   Social History   Socioeconomic History   Marital status: Married    Spouse name: Not on file   Number of children: 3   Years of education: Not on file   Highest education level: Not on file  Occupational History   Occupation: homemaker  Tobacco Use   Smoking status: Former    Current packs/day: 0.00    Types: Cigarettes    Quit date: 02/05/2006    Years since quitting: 18.0   Smokeless tobacco: Never  Vaping Use   Vaping status:  Never Used  Substance and Sexual Activity   Alcohol use: Not Currently   Drug use: No   Sexual activity: Yes    Partners: Male  Other Topics Concern   Not on file  Social History Narrative   ** Merged History Encounter **       Social Drivers of Health   Financial Resource Strain: Low Risk  (04/18/2023)   Received from [facility]   Overall Financial Resource Strain (CARDIA)    Difficulty of Paying Living Expenses: Not hard at all  Food Insecurity: No Food Insecurity (04/18/2023)   [facility]   Hunger Vital Sign    Worried About Running Out of Food in the Last Year: Never true    Ran Out of Food in the Last Year: Never true  Transportation Needs: No Transportation Needs (04/18/2023)   Received from [facility] - Transportation    Lack of Transportation (Medical): No    Lack of Transportation (Non-Medical): No  Physical Activity: Unknown (04/18/2023)   Received from [facility]   Exercise Vital Sign    Days of Exercise per Week: 0 days    Minutes of Exercise per Session: Not on file  Stress: No  Stress Concern Present (04/18/2023)   Received from [facility] - Occupational Stress Questionnaire    Feeling of Stress : Not at all  Social Connections: Socially Integrated (04/18/2023)   Received from [facility]   Social Network    How would you rate your social network (family, work, friends)?: Good participation with social networks     Family History: The patient's family history includes Allergic rhinitis in her father and son; Arthritis in her mother; Asthma in her son; CAD in her father; Colon cancer in her sister; Hypertension in her mother; Urticaria in her sister. There is no history of Angioedema, Eczema, Immunodeficiency, Esophageal cancer, Stomach cancer, or Rectal cancer.  ROS:   Please see the history of present illness.    All other systems reviewed and are negative.  EKGs/Labs/Other Studies Reviewed:    The following studies were reviewed today:  EKG reveals sinus rhythm and nonspecific ST-T changes   Recent Labs: 02/21/2024: ALT 26; BUN 15; Creatinine, Ser 0.99; Hemoglobin 13.6; Platelets 385; Potassium 4.1; Sodium 141  Recent Lipid Panel    Component Value Date/Time   CHOL 205 (A) 03/17/2022 0000   TRIG 325 (A) 03/17/2022 0000   HDL 48 03/17/2022 0000   CHOLHDL 5 08/01/2019 1117   VLDL 37.0 08/01/2019 1117   LDLCALC 92 03/17/2022 0000    Physical Exam:    VS:  BP (!) 146/86   Pulse 87   Ht 5' 2.6" (1.59 m)   Wt 190 lb 0.6 oz (86.2 kg)   LMP 09/11/2017 (Approximate)   SpO2 97%   BMI 34.10 kg/m     Wt Readings from Last 3 Encounters:  03/05/24 190 lb 0.6 oz (86.2 kg)  02/21/24 189 lb 11.2 oz (86 kg)  01/30/24 191 lb 9.6 oz (86.9 kg)     GEN: Patient is in no acute distress HEENT: Normal NECK: No JVD; No carotid bruits LYMPHATICS: No lymphadenopathy CARDIAC: S1 S2 regular, 2/6 systolic murmur at the apex. RESPIRATORY:  Clear to auscultation without rales, wheezing or rhonchi  ABDOMEN: Soft,  non-tender, non-distended MUSCULOSKELETAL:  No edema; No deformity  SKIN: Warm and dry NEUROLOGIC:  Alert and oriented x 3 PSYCHIATRIC:  Normal affect  Signed, Nelia Balzarine, MD  03/05/2024 11:15 AM    [facility]

## 2024-03-06 ENCOUNTER — Encounter: Payer: Self-pay | Admitting: Family Medicine

## 2024-03-06 ENCOUNTER — Ambulatory Visit (INDEPENDENT_AMBULATORY_CARE_PROVIDER_SITE_OTHER): Admitting: Family Medicine

## 2024-03-06 VITALS — BP 130/82 | HR 92 | Temp 98.5°F | Resp 20 | Ht 62.6 in | Wt 190.4 lb

## 2024-03-06 DIAGNOSIS — G8929 Other chronic pain: Secondary | ICD-10-CM

## 2024-03-06 DIAGNOSIS — E119 Type 2 diabetes mellitus without complications: Secondary | ICD-10-CM | POA: Diagnosis not present

## 2024-03-06 DIAGNOSIS — M25562 Pain in left knee: Secondary | ICD-10-CM | POA: Diagnosis not present

## 2024-03-06 DIAGNOSIS — L605 Yellow nail syndrome: Secondary | ICD-10-CM | POA: Insufficient documentation

## 2024-03-06 NOTE — Assessment & Plan Note (Signed)
 Symptoms noticed after polishing nails. Removed polish and the yellow color was resolving. Reapplied nail polish and yellow color returned. Attributes the yellow nail color to her diabetes. She will avoid further painting of the nails for now to assess. Recent labs show normal CBC and CMP.

## 2024-03-06 NOTE — Assessment & Plan Note (Signed)
 Recent A1C 6.5 Does not want to take metformin . Will use natural supplements and dietary modifications to manage. Follow-up in 3 months for recheck.

## 2024-03-06 NOTE — Assessment & Plan Note (Addendum)
 History of effusions and left knee pain with steroid injections x 2 in the past with good results. There is tenderness in the anterior knee near patella. ROM intact. No obvious swelling today.  Referral to ortho for evaluation and treatment if indicated.

## 2024-03-06 NOTE — Progress Notes (Signed)
 Acute Office Visit  Subjective:     Patient ID: Janet Richards, female    DOB: Mar 09, 1961, 63 y.o.   MRN: [id number]  Chief Complaint  Patient presents with   Knee Pain    Left knee used to injections 4-5 yrs ago that helped    HPI  Chronic left knee pain:  Patient is in today for left knee pain. History of getting injections in knees x 2 that helped. This was 4-6 years ago. Had effusion at that time. Fluid was removed.  Will need ortho referral today to look at the left knee.  She is currently seeing ortho at [facility].  ROM is intact. There is tenderness in anterior area.   Finger nails are yellow. Painted nails 2 weeks ago, then paint chipped. The nails are yellow in color.  Has cut out sugar and the yellow was going away, put polish back on and it returned.  Will avoid putting polish on for now.  Okay with watching.  Discussed the recent normal liver function and kidney function and normal CBC.  02/21/24 labs:  Liver function is normal.  CBC normal.  A1C 6.5, Has diabetes and will not take metformin . She is taking natural supplements to get this under control.  Okay with watching.   Diabetes: A1C 6.5. Does not want to go on metformin . She will manage her diabetes with diet and natural supplements. Long discussion around the supplements that she will use to manage the diabetes. Recheck A1C in three months.   ROS      Objective:    BP 130/82 (BP Location: Right Arm, Patient Position: Sitting, Cuff Size: Large)   Pulse 92   Temp 98.5 F (36.9 C) (Oral)   Resp 20   Ht 5' 2.6" (1.59 m)   Wt 190 lb 6 oz (86.4 kg)   LMP 09/11/2017 (Approximate)   SpO2 97%   BMI 34.16 kg/m  BP Readings from Last 3 Encounters:  03/06/24 130/82  03/05/24 (!) 146/86  02/21/24 133/82      Physical Exam Vitals and nursing note reviewed.  Constitutional:      Appearance: Normal appearance. She is normal weight.  Cardiovascular:     Rate and Rhythm: Normal rate and  regular rhythm.     Heart sounds: Normal heart sounds.  Pulmonary:     Effort: Pulmonary effort is normal.     Breath sounds: Normal breath sounds.  Musculoskeletal:        General: Tenderness present. No swelling, deformity or signs of injury.     Comments: Nails on all ten fingers are yellow. Left knee with ROM intact. Some tenderness in anterior near patella. Chronic knee issues.   Skin:    General: Skin is warm and dry.  Neurological:     General: No focal deficit present.     Mental Status: She is alert. Mental status is at baseline.  Psychiatric:        Mood and Affect: Mood normal.        Behavior: Behavior normal.        Thought Content: Thought content normal.        Judgment: Judgment normal.     No results found for any visits on 03/06/24.      Assessment & Plan:   Problem List Items Addressed This Visit     Chronic pain of left knee - Primary   History of effusions and left knee pain with steroid injections x 2 in the  past with good results. There is tenderness in the anterior knee near patella. ROM intact. No obvious swelling today.  Referral to ortho for evaluation and treatment if indicated.       Relevant Orders   Ambulatory referral to Orthopedics   Type 2 diabetes mellitus without complication, without long-term current use of insulin (HCC)   Recent A1C 6.5 Does not want to take metformin . Will use natural supplements and dietary modifications to manage. Follow-up in 3 months for recheck.       Relevant Orders   Microalbumin / creatinine urine ratio   Yellow nails   Symptoms noticed after polishing nails. Removed polish and the yellow color was resolving. Reapplied nail polish and yellow color returned. Attributes the yellow nail color to her diabetes. She will avoid further painting of the nails for now to assess. Recent labs show normal CBC and CMP.      Will get walking boot from ortho office while awaiting cardiac clearance for right  ankle surgery.  Agrees with plan of care discussed.  Questions answered.  Total time face to face discussing symptoms, upcoming cardiac clearance and ankle surgery, and plan for diabetes management: 45 minutes       Return in about 3 months (around 06/05/2024) for DM.  Mickiel Albany, FNP

## 2024-03-07 ENCOUNTER — Encounter: Payer: Self-pay | Admitting: Family Medicine

## 2024-03-07 LAB — MICROALBUMIN / CREATININE URINE RATIO
Creatinine, Urine: 72.2 mg/dL
Microalb/Creat Ratio: <4 mg/g{creat} (ref 0–29)
Microalbumin, Urine: <3 ug/mL

## 2024-03-20 ENCOUNTER — Encounter (HOSPITAL_BASED_OUTPATIENT_CLINIC_OR_DEPARTMENT_OTHER): Admitting: Orthopaedic Surgery

## 2024-03-25 ENCOUNTER — Other Ambulatory Visit: Payer: Self-pay | Admitting: Cardiology

## 2024-03-25 DIAGNOSIS — Z01810 Encounter for preprocedural cardiovascular examination: Secondary | ICD-10-CM

## 2024-04-02 ENCOUNTER — Telehealth: Payer: Self-pay

## 2024-04-02 ENCOUNTER — Other Ambulatory Visit: Payer: Self-pay | Admitting: Family Medicine

## 2024-04-02 DIAGNOSIS — E119 Type 2 diabetes mellitus without complications: Secondary | ICD-10-CM

## 2024-04-02 MED ORDER — FREESTYLE LIBRE 3 PLUS SENSOR MISC: 6 refills | Status: DC

## 2024-04-02 NOTE — Telephone Encounter (Signed)
 Copied from CRM [id number]. Topic: Clinical - Prescription Issue >> Apr 02, 2024  8:33 AM Baldomero Bone wrote: Reason for CRM: Patient did not pick up the Metformin  from the pharmacy because it is for a Type 2 diabetic. Patient would prefer insulin patch pump that goes on your arm. Callback number [phone]

## 2024-04-02 NOTE — Telephone Encounter (Signed)
 Freestyle Libre continuous glucose monitor sent to pharmacy per request. A1C 6.5 this is type 2 diabetes range. Per patient preference she is not taking metformin . Recommend strict dietary changes and exercise to prevent progression of diabetes.

## 2024-04-03 ENCOUNTER — Telehealth (HOSPITAL_COMMUNITY): Payer: Self-pay | Admitting: Cardiology

## 2024-04-03 NOTE — Telephone Encounter (Signed)
 Patient cancelled Myoview  for reason below:  04/03/2024 2:30 PM UJ:[id number], MARY J  Cancel Rsn: Patient (Has conflict will call back to reschedule)  Had to cancel due to emergency with Nuc Tech schedule/LBW  04/02/24 pt will callme back to reschedule she is on the phone with MD as we soeak. 8:23/LBW   Order will be removed from the nuc WQ and when pt calls back we can reinstate the order. Thank you.

## 2024-04-05 ENCOUNTER — Telehealth (HOSPITAL_COMMUNITY): Payer: Self-pay | Admitting: Cardiology

## 2024-04-05 ENCOUNTER — Ambulatory Visit (HOSPITAL_COMMUNITY): Admission: RE | Admit: 2024-04-05 | Source: Ambulatory Visit | Attending: Cardiology | Admitting: Cardiology

## 2024-04-05 ENCOUNTER — Ambulatory Visit (HOSPITAL_COMMUNITY)
Admission: RE | Admit: 2024-04-05 | Discharge: 2024-04-05 | Disposition: A | Discharge status: Home / Self Care | Source: Ambulatory Visit | Attending: Cardiology | Admitting: Cardiology

## 2024-04-05 ENCOUNTER — Ambulatory Visit: Payer: Self-pay | Admitting: Cardiology

## 2024-04-05 DIAGNOSIS — Z01810 Encounter for preprocedural cardiovascular examination: Secondary | ICD-10-CM

## 2024-04-05 LAB — ECHOCARDIOGRAM COMPLETE
Area-P 1/2: 4.39 cm2
S' Lateral: 3.1 cm

## 2024-04-05 NOTE — Telephone Encounter (Signed)
 Patient cancelled Myoview reason below:  04/03/2024 2:30 PM By: Veronica Gordon  Cancel Rsn: Patient (Has conflict will call back to reschedule)   Order will be removed from the [facility] and when patient calls back we will reinstate the order. Thank you.

## 2024-04-10 ENCOUNTER — Ambulatory Visit (HOSPITAL_COMMUNITY): Admission: RE | Admit: 2024-04-10 | Source: Ambulatory Visit | Attending: Cardiology | Admitting: Cardiology

## 2024-04-10 DIAGNOSIS — J3089 Other allergic rhinitis: Secondary | ICD-10-CM | POA: Diagnosis not present

## 2024-04-10 DIAGNOSIS — L509 Urticaria, unspecified: Secondary | ICD-10-CM | POA: Diagnosis not present

## 2024-04-10 DIAGNOSIS — Z91018 Allergy to other foods: Secondary | ICD-10-CM | POA: Diagnosis not present

## 2024-04-10 DIAGNOSIS — R053 Chronic cough: Secondary | ICD-10-CM | POA: Diagnosis not present

## 2024-04-10 DIAGNOSIS — J302 Other seasonal allergic rhinitis: Secondary | ICD-10-CM | POA: Diagnosis not present

## 2024-04-12 ENCOUNTER — Ambulatory Visit (INDEPENDENT_AMBULATORY_CARE_PROVIDER_SITE_OTHER): Admitting: Family Medicine

## 2024-04-12 ENCOUNTER — Encounter: Payer: Self-pay | Admitting: Family Medicine

## 2024-04-12 DIAGNOSIS — E119 Type 2 diabetes mellitus without complications: Secondary | ICD-10-CM | POA: Diagnosis not present

## 2024-04-12 DIAGNOSIS — I10 Essential (primary) hypertension: Secondary | ICD-10-CM

## 2024-04-12 MED ORDER — HYDROCHLOROTHIAZIDE 25 MG PO TABS: ORAL_TABLET | ORAL | 0 refills | Status: DC

## 2024-04-12 NOTE — Assessment & Plan Note (Addendum)
 Prescribed hydrochlorothiazide  25 mg daily.  Has been taking as needed based on blood pressure readings. Blood pressures are well controlled at home. Near goal in the office today. Continue to monitor at home, notify provider if readings are consistently greater than 130/80. Follow-up in July for A1C check, will assess blood pressure at that visit.  Refill sent.

## 2024-04-12 NOTE — Progress Notes (Signed)
 Established Patient Office Visit  Subjective   Patient ID: Janet Richards, female    DOB: 08/16/1961  Age: 63 y.o. MRN: [id number]  Chief Complaint  Patient presents with   Hypertension    3 month follow up    HPI  Hypertension Medication compliance: Taking hydrochlorothiazide  25 mg daily as needed. Did not take medicine this morning. Reports blood pressure has improved since wearing Freestyle Libre continuous glucose monitor.  Denies chest pain, shortness of breath, lower extremity edema, vision changes, headaches.  Pertinent lab work: 02/21/24: potassium 4.1 Monitoring at home: 130/78, 118/79, 115/76, 121/78 Tolerating medication well: no side effects reported  Continue current medication regimen: no change  Follow-up: 6 months   Allergy testing yesterday: Had to go off of allergy medicine for this testing. TMs normal. Lungs clear. Has restarted allergy medicine.  Expect her symptoms will continue to improve since being back on medication for allergies.    Review of Systems  Eyes:  Negative for blurred vision and double vision.  Respiratory:  Negative for shortness of breath.   Cardiovascular:  Negative for chest pain.  Neurological:  Negative for headaches.      Objective:     BP 138/85 (BP Location: Right Arm, Patient Position: Sitting, Cuff Size: Large)   Pulse 87   Temp 98.4 F (36.9 C) (Oral)   Ht 5\' 3"  (1.6 m)   Wt 185 lb (83.9 kg)   LMP 09/11/2017 (Approximate)   SpO2 98%   BMI 32.77 kg/m  BP Readings from Last 3 Encounters:  04/12/24 138/85  03/06/24 130/82  03/05/24 (!) 146/86      Physical Exam Vitals and nursing note reviewed.  Constitutional:      General: She is not in acute distress.    Appearance: Normal appearance. She is not ill-appearing.  HENT:     Right Ear: Tympanic membrane normal.     Left Ear: Tympanic membrane normal.  Cardiovascular:     Rate and Rhythm: Normal rate and regular rhythm.     Heart sounds: Normal heart  sounds.  Pulmonary:     Effort: Pulmonary effort is normal.     Breath sounds: Normal breath sounds.  Skin:    General: Skin is warm and dry.  Neurological:     General: No focal deficit present.     Mental Status: She is alert. Mental status is at baseline.  Psychiatric:        Mood and Affect: Mood normal.        Behavior: Behavior normal.        Thought Content: Thought content normal.        Judgment: Judgment normal.     No results found for any visits on 04/12/24.  Last hemoglobin A1c Lab Results  Component Value Date   HGBA1C 6.5 (H) 02/21/2024      The 10-year ASCVD risk score (Arnett DK, et al., 2019) is: 11.5%    Assessment & Plan:   Problem List Items Addressed This Visit     Essential hypertension (Chronic)   Prescribed hydrochlorothiazide  25 mg daily.  Has been taking as needed based on blood pressure readings. Blood pressures are well controlled at home. Near goal in the office today. Continue to monitor at home, notify provider if readings are consistently greater than 130/80. Follow-up in July for A1C check, will assess blood pressure at that visit.  Refill sent.          Relevant Medications   hydrochlorothiazide  (HYDRODIURIL )  25 MG tablet  Agrees with plan of care discussed.  Questions answered.   Return for has appointment 7/30 for DM .    Mickiel Albany, FNP

## 2024-04-18 NOTE — Telephone Encounter (Signed)
Results reviewed with pt as per Dr. Revankar's note.  Pt verbalized understanding and had no additional questions. Routed to PCP.  

## 2024-05-02 DIAGNOSIS — J301 Allergic rhinitis due to pollen: Secondary | ICD-10-CM | POA: Diagnosis not present

## 2024-05-02 DIAGNOSIS — J3089 Other allergic rhinitis: Secondary | ICD-10-CM | POA: Diagnosis not present

## 2024-05-08 ENCOUNTER — Other Ambulatory Visit: Payer: Self-pay

## 2024-05-08 ENCOUNTER — Ambulatory Visit
Admission: EM | Admit: 2024-05-08 | Discharge: 2024-05-08 | Disposition: A | Discharge status: Home / Self Care | Attending: Internal Medicine | Admitting: Internal Medicine

## 2024-05-08 DIAGNOSIS — H6121 Impacted cerumen, right ear: Secondary | ICD-10-CM | POA: Diagnosis not present

## 2024-05-08 DIAGNOSIS — R1013 Epigastric pain: Secondary | ICD-10-CM | POA: Diagnosis not present

## 2024-05-08 DIAGNOSIS — R3129 Other microscopic hematuria: Secondary | ICD-10-CM

## 2024-05-08 DIAGNOSIS — H9201 Otalgia, right ear: Secondary | ICD-10-CM | POA: Diagnosis not present

## 2024-05-08 LAB — POCT URINALYSIS DIP (MANUAL ENTRY)
Bilirubin, UA: NEGATIVE
Glucose, UA: NEGATIVE mg/dL
Ketones, POC UA: NEGATIVE mg/dL
Leukocytes, UA: NEGATIVE
Nitrite, UA: NEGATIVE
Protein Ur, POC: NEGATIVE mg/dL
Spec Grav, UA: 1.02 (ref 1.010–1.025)
Urobilinogen, UA: NEGATIVE U/dL — AB
pH, UA: 6 (ref 5.0–8.0)

## 2024-05-08 NOTE — Discharge Instructions (Signed)
 Your blood work and urine were sent to the lab for further testing. Someone from our office will call you with your results.  I recommend you start taking the pantoprazole  daily. I also recommend you follow up with your PCP as well for possible imaging given history of possible gallstones.   Please noted our office is limited in the tests and imaging we can perform at our facility. Your evaluation was not suggestive of any emergent condition requiring medical intervention at this time. However, some abdominal problems make take more time to appear. Therefore, it is very important for you to pay attention to any new symptoms or worsening of your current condition.   Please go directly to the Emergency Department immediately should you begin to feel worse in any way or have any of the following symptoms: increasing or different abdominal pain, persistent vomiting, inability to drink fluids, fevers, bloody bowel movements, or begin vomiting blood.

## 2024-05-08 NOTE — ED Provider Notes (Signed)
 [facility]  Note:  This document was prepared using Dragon voice recognition software and may include unintentional dictation errors.  MRN: [id number] DOB: [birth year] DATE: 05/08/24   Subjective:  Chief Complaint:  Chief Complaint  Patient presents with   Abdominal Pain     HPI: Janet Richards is a 63 y.o. female presenting for multiple issues.   First, patient presents for epigastric pain for one day. Patient states early this morning she started with epigastric pain at 1:00am. She states that she was very bloated at the time and was unable to comfortable last night due to the pain. She reports going to the restroom multiple times and urinating with no relief as well as having two BM with no relief. She states that the pain seemed to subside by 10:00am without intervention. She reports having cheesecake prior to bed as well as possibly changing the chewable aspirin  she was taking.  She reports no abdominal surgeries.  However, she does have a history of GERD as well as questionable gallstones versus kidney stones.  Patient is unsure herself exactly which she has had is in the past. Per EMR, chronic pancreatitis was reported as well. She has pantoprazole  at home, but only takes as needed and has not taken any recently including during onset of symptoms.   Patient is also reporting intermittent right otalgia. Reports sharp pains in the ear. No discharge.  Denies fever, nausea/vomiting, diarrhea, bloody stools, cough, congestion, sore throat. Endorses epigastric pain, right otalgia. Presents NAD.  Prior to Admission medications   Medication Sig Start Date End Date Taking? Authorizing Provider  aspirin  EC 325 MG tablet Take 1 tablet (325 mg total) by mouth daily. 02/12/24   Genelle Standing, MD  azelastine  (ASTELIN ) 0.1 % nasal spray Place 1 spray into both nostrils as needed for rhinitis or allergies. 11/15/23   [provider]  cetirizine  (ZYRTEC ) 10 MG tablet TAKE ONE TABLET  BY MOUTH EVERY DAY 03/01/23   Vicci Barnie NOVAK, MD  chlorhexidine  (PERIDEX ) 0.12 % solution Use as directed 15 mLs in the mouth or throat 2 (two) times daily. 02/27/24   Booker Darice SAUNDERS, FNP  Continuous Glucose Sensor (FREESTYLE LIBRE 3 PLUS SENSOR) MISC Change sensor every 15 days. 04/02/24   Booker Darice SAUNDERS, FNP  hydrochlorothiazide  (HYDRODIURIL ) 25 MG tablet Take one tablet (25 mg dose) by mouth daily. 04/12/24   Booker Darice SAUNDERS, FNP  ibuprofen  (ADVIL ) 600 MG tablet Take 1 tablet (600 mg total) by mouth every 6 (six) hours as needed. 09/09/22   Silver Wonda LABOR, PA  Nutritional Supplements (IMMUNOCAL PO) Take 1 tablet by mouth daily.    [provider]  oxyCODONE  (ROXICODONE ) 5 MG immediate release tablet Take 1 tablet (5 mg total) by mouth every 4 (four) hours as needed for severe pain (pain score 7-10) or breakthrough pain. 02/12/24   Genelle Standing, MD  PANTOPRAZOLE  SODIUM PO Take 100 mg by mouth as needed (reflux).    [provider]     Allergies  Allergen Reactions   Azithromycin  Nausea And Vomiting   Clindamycin /Lincomycin Hives    Facial swelling   Erythromycin Hives and Nausea And Vomiting    Has taken azithromycin  with no issues in the past.   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  Patient instructed by provider to lose weight before tubal reversal.     Last Assessment & Plan:   Formatting of this note might be different from the original.    Obesity is complicated by hyperlipidemia, hypertension and prediabetes.     Continue healthy cardiac diet and exercise.     Contact dermatitis 08/18/2022   DDD (degenerative disc disease), cervical 02/22/2018   Dermatitis due to drugs and medicines taken internally 08/18/2022   Dyspepsia 07/31/2018   Dysuria 09/16/2019   Eczema 08/18/2022   Ptd prescribed elidel.     Encounter for screening for metabolic disorder 01/11/2024   Environmental allergies 09/12/2018   Mold, trees, dust mites, mildew. 2019     Essential hypertension 03/01/2013   Essential hypertension, benign    Exposure to mold 01/11/2024   Ganglion cyst of right foot 07/12/2016   Generalized headaches 02/18/2020   GERD (gastroesophageal reflux disease) 02/18/2020   History of colonic polyps 03/20/2015   May 2016 diverticulosis single 5mm polyp repeat 03/2020     History of tubal ligation 09/17/2021   Hyperlipidemia 02/20/2015   AHA 10 year risk of 3.5% April 2016  No access to HDL total cholesterol 238 88/7981. Pt would like to continue lifestyle changes and recheck in 6 months to determine statin usage.      Irritation of ear, bilateral 03/09/2020   Lateral epicondylitis of elbow 08/18/2022   Pth has been using rt hand has tenosynovitis radial aspect of hand and also epicondylitis of rt elbow.,  Tendero on compression of epicondyles.  Pt has been using rt side instead of left having had a sprain left wrist at work.     Low back pain 08/18/2022   Mild intermittent asthma without complication 07/31/2018   NAFLD (nonalcoholic fatty liver disease) 90/89/7975   No blood products 03/28/2013   [employer]: Signed papers scanned May 2014 into EMR     Numbness and tingling of both feet 09/02/2016   Osteoarthritis of first metatarsophalangeal joint 04/11/2014   Osteoarthritis of knee 09/17/2021   Other allergic rhinitis 01/14/2015   Overweight 08/18/2022   Patient instructed by provider to lose weight before tubal reversal.      Palpitations 03/09/2020   Periodontitis    Post-menopausal bleeding 05/21/2018   Pre-op evaluation 02/21/2024   Prediabetes 10/17/2014   Preop cardiovascular exam    Primary osteoarthritis of both knees    Sensory neuronopathy 07/29/2018   Type 2 diabetes mellitus without complication, without long-term current use of insulin (HCC) 02/22/2024   Urticaria    Vaccine counseling 04/09/2020   Vaginal dryness, menopausal 03/01/2017     Past Surgical History:  Procedure Laterality Date   TUBAL LIGATION  12/26/1987    Family History  Problem Relation Age of Onset   Arthritis Mother    Hypertension Mother    CAD Father        MI in his 59s   Allergic rhinitis Father    Urticaria Sister    Colon cancer Sister    Allergic rhinitis Son    Asthma Son    Angioedema Neg Hx  Eczema Neg Hx    Immunodeficiency Neg Hx    Esophageal cancer Neg Hx    Stomach cancer Neg Hx    Rectal cancer Neg Hx     Social History   Tobacco Use   Smoking status: Former    Current packs/day: 0.00    Types: Cigarettes    Quit date: 02/05/2006    Years since quitting: 18.2   Smokeless tobacco: Never  Vaping Use   Vaping status: Never Used  Substance Use Topics   Alcohol use: Not Currently   Drug use: No    Review of Systems  Constitutional:  Negative for fever.  HENT:  Positive for ear pain. Negative for congestion, rhinorrhea and sore throat.   Respiratory:  Negative for cough.   Gastrointestinal:  Positive for abdominal pain. Negative for blood in stool, diarrhea, nausea and vomiting.  Genitourinary:  Positive for frequency. Negative for dysuria, hematuria and vaginal discharge.     Objective:   Vitals: BP 136/87 (BP Location: Right Arm)   Pulse 89   Temp 99.6 F (37.6 C) (Oral)   Resp 16   LMP 09/11/2017 (Approximate)   SpO2 97%   Physical Exam Constitutional:      General: She is not in acute distress.    Appearance: Normal appearance. She is well-developed. She is obese.  She is not ill-appearing or toxic-appearing.  HENT:     Head: Normocephalic and atraumatic.     Right Ear: A middle ear effusion is present. There is impacted cerumen. Tympanic membrane is not erythematous.     Left Ear: Ear canal normal. A middle ear effusion is present.     Ears:     Comments: Cerumen noted in right EAC obstructing right TM. Lavage was tempted, but patient unable to tolerate due to dizziness. TM noted behind the cerumen was not erythematous, but effusion noted.  Cardiovascular:     Rate and Rhythm: Normal rate and regular rhythm.     Heart sounds: Normal heart sounds.  Pulmonary:     Effort: Pulmonary effort is normal.     Breath sounds: Normal breath sounds.     Comments: Clear to auscultation bilaterally  Abdominal:     General: Bowel sounds are normal.     Palpations: Abdomen is soft.     Tenderness: There is no abdominal tenderness. There is no right CVA tenderness or left CVA tenderness.  Skin:    General: Skin is warm and dry.  Neurological:     General: No focal deficit present.     Mental Status: She is alert.  Psychiatric:        Mood and Affect: Mood and affect normal.     Results:  Labs: No results found for this or any previous visit (from the past 24 hours).  Radiology: No results found.   UC Course/Treatments:  Procedures: Ear Cerumen Removal  Date/Time: 05/08/2024 1:47 PM  Performed by: Basilia Ulanda SQUIBB, PA-C Authorized by: Basilia Ulanda SQUIBB, PA-C   Consent:    Consent obtained:  Verbal   Consent given by:  Patient   Risks, benefits, and alternatives were discussed: yes     Risks discussed:  Dizziness, pain and incomplete removal   Alternatives discussed:  No treatment and referral Universal protocol:    Patient identity confirmed:  Verbally with patient Procedure details:    Location:  R ear   Procedure type comment:  Both curette and irrigation   Procedure outcomes: unable to remove cerumen  Post-procedure details:     Inspection:  Some cerumen remaining   Procedure completion:  Procedure terminated electively by provider Comments:     Lavage was attempted in the right eat, but discontinued when patient reported dizziness. A small amount of cerumen was flushed with the lavage. Curette was then used, but patient unable to tolerate procedure due to discomfort. Small amount of cerumen removed with curette, but large amount remains. Recommend follow up with ENT for complete removal.     Medications Ordered in UC: Medications - No data to display   Assessment and Plan :     ICD-10-CM   1. Epigastric pain  R10.13 Comprehensive metabolic panel    CBC with Differential/Platelet    Lipase    Comprehensive metabolic panel    CBC with Differential/Platelet    Lipase    2. Acute otalgia, right  H92.01     3. Other microscopic hematuria  R31.29 Urine Culture    Urine Culture    4. Impacted cerumen of right ear  H61.21       Epigastric pain Other microscopic hematuria Afebrile, nontoxic-appearing, NAD. VSS. DDX includes but not limited to: GERD, gastritis, nephrolithiasis, pyelonephritis, pancreatitis, cholecystitis, cholelithiasis UA was positive for trace blood. Urine culture pending. Low suspicion of urologic etiology at this time given no dysuria and improvement without intervention. Will adjust treatment plan based on results. At this point, questionable cholelithiasis versus GERD. CMP, CBC, and lipase are pending to evaluate organ function, WBC, and for pancreatitis. If possible history of gallstones, recommend follow up with PCP for imaging of RUQ. Recommend she start taking the pantoprazole  daily as well for possible GERD versus gastritis. Strict ED precautions were given and patient verbalized understanding.  Acute otalgia, right Impacted cerumen of the right ear Afebrile, nontoxic-appearing, NAD. VSS. DDX includes but not limited to: otitis media, otitis externa, eustachian tube dysfunction,  cerumen impaction Cerumen noted on exam. Lavage attempted, but patient reported dizziness. Curette was attempted as well, but patient reported discomfort. Small amount of cerumen removed, but large amount remained. Recommend ENT for removal. Portion of right TM visualized on exam was not erythematous with effusion. Strict ED precautions were given and patient verbalized understanding.  ED Discharge Orders     None        I have reviewed the PDMP during this encounter.     Cerria Randhawa P, PA-C 05/08/24 1351

## 2024-05-08 NOTE — ED Triage Notes (Signed)
 C/O epigastric pain since 0100 last night. Patient denies nausea, vomiting and diarrhea. Patient states she had pain after eating cheese cake. Patient states she felt very bloated. Abdominal pain is mild now.  Patient also C/O right ear discomfort.

## 2024-05-09 ENCOUNTER — Telehealth (INDEPENDENT_AMBULATORY_CARE_PROVIDER_SITE_OTHER): Payer: Self-pay | Admitting: Family Medicine

## 2024-05-09 ENCOUNTER — Ambulatory Visit: Payer: Self-pay | Admitting: Family Medicine

## 2024-05-09 ENCOUNTER — Ambulatory Visit: Payer: Self-pay | Admitting: Internal Medicine

## 2024-05-09 ENCOUNTER — Other Ambulatory Visit: Payer: Self-pay | Admitting: Family Medicine

## 2024-05-09 ENCOUNTER — Ambulatory Visit

## 2024-05-09 ENCOUNTER — Ambulatory Visit: Payer: Self-pay

## 2024-05-09 DIAGNOSIS — R1011 Right upper quadrant pain: Secondary | ICD-10-CM

## 2024-05-09 LAB — COMPREHENSIVE METABOLIC PANEL WITH GFR
ALT: 23 IU/L (ref 0–32)
AST: 17 IU/L (ref 0–40)
Albumin: 4.5 g/dL (ref 3.8–4.9)
Alkaline Phosphatase: 86 IU/L (ref 44–121)
BUN/Creatinine Ratio: 16 (ref 12–28)
BUN: 14 mg/dL (ref 8–27)
Bilirubin Total: 0.4 mg/dL (ref 0.0–1.2)
CO2: 24 mmol/L (ref 20–29)
Calcium: 10.4 mg/dL — ABNORMAL HIGH (ref 8.7–10.3)
Chloride: 99 mmol/L (ref 96–106)
Creatinine, Ser: 0.87 mg/dL (ref 0.57–1.00)
Globulin, Total: 2.5 g/dL (ref 1.5–4.5)
Glucose: 99 mg/dL (ref 70–99)
Potassium: 3.6 mmol/L (ref 3.5–5.2)
Sodium: 138 mmol/L (ref 134–144)
Total Protein: 7 g/dL (ref 6.0–8.5)
eGFR: 76 mL/min/{1.73_m2} (ref >59)

## 2024-05-09 LAB — CBC WITH DIFFERENTIAL/PLATELET
Basophils Absolute: 0 10*3/uL (ref 0.0–0.2)
Basos: 0 %
EOS (ABSOLUTE): 0.2 10*3/uL (ref 0.0–0.4)
Eos: 3 %
Hematocrit: 41.6 % (ref 34.0–46.6)
Hemoglobin: 13.3 g/dL (ref 11.1–15.9)
Immature Grans (Abs): 0 10*3/uL (ref 0.0–0.1)
Immature Granulocytes: 0 %
Lymphocytes Absolute: 2.6 10*3/uL (ref 0.7–3.1)
Lymphs: 38 %
MCH: 29.8 pg (ref 26.6–33.0)
MCHC: 32 g/dL (ref 31.5–35.7)
MCV: 93 fL (ref 79–97)
Monocytes Absolute: 0.3 10*3/uL (ref 0.1–0.9)
Monocytes: 4 %
Neutrophils Absolute: 3.8 10*3/uL (ref 1.4–7.0)
Neutrophils: 55 %
Platelets: 419 10*3/uL (ref 150–450)
RBC: 4.47 x10E6/uL (ref 3.77–5.28)
RDW: 13.4 % (ref 11.7–15.4)
WBC: 6.8 10*3/uL (ref 3.4–10.8)

## 2024-05-09 LAB — URINE CULTURE

## 2024-05-09 LAB — LIPASE: Lipase: 42 U/L (ref 14–72)

## 2024-05-09 NOTE — Telephone Encounter (Signed)
 Patient notified nurse triage of urgent care visit and recommendation from provider. Reviewed urgent care visit.  Abdominal ultrasound order placed for evaluation of RUQ pain. Spoke with patient on the phone to inform her of this order. Follow-up once results are back.

## 2024-05-09 NOTE — Telephone Encounter (Signed)
 FYI Only or Action Required?: FYI only for provider.  Patient was last seen in primary care on 04/12/2024 by Booker Darice SAUNDERS, FNP. Called Nurse Triage reporting Abdominal Pain. Symptoms began several days ago. Interventions attempted: Prescription medications: pantoprazole . Symptoms are: stable.  Triage Disposition: Call PCP Now (overriding See Physician Within 24 Hours)  Patient/caregiver understands and will follow disposition?: Yes  Pt went to UC yesterday and was advised she may need some imaging done for abdominal pain if no improvement. Pt states she is still having on/off pain and unsure why. Pt asking if PCP will order imaging to have done. Advised pt I would send message back and have nurse FU with her.   NOTES from UC VISIT 05/08/24 Epigastric pain Other microscopic hematuria Afebrile, nontoxic-appearing, NAD. VSS. DDX includes but not limited to: GERD, gastritis, nephrolithiasis, pyelonephritis, pancreatitis, cholecystitis, cholelithiasis UA was positive for trace blood. Urine culture pending. Low suspicion of urologic etiology at this time given no dysuria and improvement without intervention. Will adjust treatment plan based on results. At this point, questionable cholelithiasis versus GERD. CMP, CBC, and lipase are pending to evaluate organ function, WBC, and for pancreatitis. If possible history of gallstones, recommend follow up with PCP for imaging of RUQ. Recommend she start taking the pantoprazole  daily as well for possible GERD versus gastritis. Strict ED precautions were given and patient verbalized understanding.  Copied from CRM [id number]. Topic: Clinical - Red Word Triage >> May 09, 2024  8:56 AM Treva T wrote: Red Word that prompted transfer to Nurse Triage: Patient calling with new onset of abdominal pain, and some occasional bloating. Reason for Disposition  [1] MODERATE pain (e.g., interferes with normal activities) AND [2] pain comes and goes (cramps) AND [3] present > 24  hours  (Exception: Pain with Vomiting or Diarrhea - see that Guideline.)  Answer Assessment - Initial Assessment Questions 1. LOCATION: Where does it hurt?      Upper stomach under breast  3. ONSET: When did the pain begin? (e.g., minutes, hours or days ago)      Tuesday   5. PATTERN Does the pain come and go, or is it constant?    - If it comes and goes: How long does it last? Do you have pain now?     (Note: Comes and goes means the pain is intermittent. It goes away completely between bouts.)    - If constant: Is it getting better, staying the same, or getting worse?      (Note: Constant means the pain never goes away completely; most serious pain is constant and gets worse.)      Comes and goes  6. SEVERITY: How bad is the pain?  (e.g., Scale 1-10; mild, moderate, or severe)    - MILD (1-3): Doesn't interfere with normal activities, abdomen soft and not tender to touch.     - MODERATE (4-7): Interferes with normal activities or awakens from sleep, abdomen tender to touch.     - SEVERE (8-10): Excruciating pain, doubled over, unable to do any normal activities.       Moderate where she wasn't able to lay down at night  10. OTHER SYMPTOMS: Do you have any other symptoms? (e.g., back pain, diarrhea, fever, urination pain, vomiting)       Bloating  Protocols used: Abdominal Pain - Female-A-AH

## 2024-06-04 DIAGNOSIS — J301 Allergic rhinitis due to pollen: Secondary | ICD-10-CM | POA: Diagnosis not present

## 2024-06-04 DIAGNOSIS — J3089 Other allergic rhinitis: Secondary | ICD-10-CM | POA: Diagnosis not present

## 2024-06-05 ENCOUNTER — Encounter: Payer: Self-pay | Admitting: Family Medicine

## 2024-06-05 ENCOUNTER — Ambulatory Visit (INDEPENDENT_AMBULATORY_CARE_PROVIDER_SITE_OTHER): Admitting: Family Medicine

## 2024-06-05 VITALS — BP 130/68 | HR 84 | Temp 98.4°F | Ht 65.0 in | Wt 183.0 lb

## 2024-06-05 DIAGNOSIS — I10 Essential (primary) hypertension: Secondary | ICD-10-CM | POA: Diagnosis not present

## 2024-06-05 DIAGNOSIS — E119 Type 2 diabetes mellitus without complications: Secondary | ICD-10-CM | POA: Diagnosis not present

## 2024-06-05 DIAGNOSIS — R3915 Urgency of urination: Secondary | ICD-10-CM | POA: Insufficient documentation

## 2024-06-05 LAB — POCT URINALYSIS DIP (CLINITEK)
Bilirubin, UA: NEGATIVE
Blood, UA: NEGATIVE
Glucose, UA: NEGATIVE mg/dL
Ketones, POC UA: NEGATIVE mg/dL
Leukocytes, UA: NEGATIVE
Nitrite, UA: NEGATIVE
Spec Grav, UA: <=1.005 — AB (ref 1.010–1.025)
Urobilinogen, UA: 2 U/dL — AB
pH, UA: 5 (ref 5.0–8.0)

## 2024-06-05 NOTE — Progress Notes (Signed)
   Established Patient Office Visit  Subjective   Patient ID: Janet Richards, female    DOB: 03-30-1961  Age: 63 y.o. MRN: [id number]  Chief Complaint  Patient presents with   Diabetes    Urinary urgency    HPI  Diabetes: Medication compliance: Not currently taking medications.  Denies chest pain, shortness of breath, vision changes, polydipsia, polyphagia, polyuria. Denies hypoglycemia.  Pertinent lab work: A1C: 4/16 6.5 Monitoring: blood sugar readings at home: 80-95          Continue current medication regimen: n/a   Well controlled: check A1C today  Follow-up: 3 months  Lifestyle changes with diet and exercise.   Urinary urgency: Check urine dip today.  Present for one week.  Will check urine today. No fever or CVA tenderness.       Physical Exam Vitals and nursing note reviewed.  Constitutional:      General: She is not in acute distress.    Appearance: Normal appearance.  Cardiovascular:     Rate and Rhythm: Normal rate and regular rhythm.     Heart sounds: Normal heart sounds.  Pulmonary:     Breath sounds: Normal breath sounds.  Abdominal:     Tenderness: There is no right CVA tenderness or left CVA tenderness.  Skin:    General: Skin is warm and dry.  Neurological:     General: No focal deficit present.     Mental Status: She is alert. Mental status is at baseline.  Psychiatric:        Mood and Affect: Mood normal.        Behavior: Behavior normal.        Thought Content: Thought content normal.        Judgment: Judgment normal.    130/68-84- 98.4  Results for orders placed or performed in visit on 06/05/24  POCT URINALYSIS DIP (CLINITEK)  Result Value Ref Range   Color, UA light yellow (A) yellow   Clarity, UA clear clear   Glucose, UA negative negative mg/dL   Bilirubin, UA negative negative   Ketones, POC UA negative negative mg/dL   Spec Grav, UA <=8.994 (A) 1.010 - 1.025   Blood, UA negative negative   pH, UA 5.0 5.0 - 8.0   POC  PROTEIN,UA trace negative, trace   Urobilinogen, UA 2.0 (A) 0.2 or 1.0 E.U./dL   Nitrite, UA Negative Negative   Leukocytes, UA Negative Negative      The 10-year ASCVD risk score (Arnett DK, et al., 2019) is: 11.2%    Assessment & Plan:   Problem List Items Addressed This Visit     Type 2 diabetes mellitus without complication, without long-term current use of insulin (HCC) - Primary   Not currently on metformin  for diabetes. Last A1C 6.5. Recheck A1C  today. Prefers to manage diabetes naturally.        Relevant Orders   Basic metabolic panel with GFR   Hemoglobin A1c   Urgency of urination   Describes urinary urgency without dysuria. Urine dip negative in office today. Increase water and urinate after intercourse. Follow-up as needed.       Relevant Orders   POCT URINALYSIS DIP (CLINITEK) (Completed)  Agrees with plan of care discussed.  Questions answered.   Return in about 3 months (around 09/05/2024) for DM.    Darice JONELLE Brownie, FNP

## 2024-06-05 NOTE — Assessment & Plan Note (Signed)
 Describes urinary urgency without dysuria. Urine dip negative in office today. Increase water and urinate after intercourse. Follow-up as needed.

## 2024-06-05 NOTE — Assessment & Plan Note (Signed)
 Not currently on metformin  for diabetes. Last A1C 6.5. Recheck A1C  today. Prefers to manage diabetes naturally.

## 2024-06-06 ENCOUNTER — Ambulatory Visit: Payer: Self-pay | Admitting: Family Medicine

## 2024-06-06 LAB — BASIC METABOLIC PANEL WITH GFR
BUN/Creatinine Ratio: 14 (ref 12–28)
BUN: 12 mg/dL (ref 8–27)
CO2: 28 mmol/L (ref 20–29)
Calcium: 9.8 mg/dL (ref 8.7–10.3)
Chloride: 99 mmol/L (ref 96–106)
Creatinine, Ser: 0.87 mg/dL (ref 0.57–1.00)
Glucose: 111 mg/dL — ABNORMAL HIGH (ref 70–99)
Potassium: 4.3 mmol/L (ref 3.5–5.2)
Sodium: 142 mmol/L (ref 134–144)
eGFR: 76 mL/min/1.73 (ref >59)

## 2024-06-06 LAB — HEMOGLOBIN A1C
Est. average glucose Bld gHb Est-mCnc: 131 mg/dL
Hgb A1c MFr Bld: 6.2 % — ABNORMAL HIGH (ref 4.8–5.6)

## 2024-06-06 NOTE — Telephone Encounter (Signed)
-----   Message from Darice JONELLE Brownie sent at 06/06/2024  7:55 AM EDT ----- Please notify Ms. Fetsch. Her kidney function and electrolytes are within normal limits. The A1C has improved and is now 6.2, down from 6.5. Thanks, Darice  ----- Message ----- From: Encarnacion Conard MATSU, CMA Sent: 06/05/2024   2:38 PM EDT To: Darice JONELLE Brownie, FNP

## 2024-06-06 NOTE — Telephone Encounter (Signed)
 I informed Mrs. Janet Richards that her kidney function and electrolytes were normal and her A1C had improved from 6.5 to 6.2. She stated understanding of the information.  Janet Richards, CMA

## 2024-06-10 ENCOUNTER — Other Ambulatory Visit: Payer: Self-pay | Admitting: Family Medicine

## 2024-06-10 DIAGNOSIS — E119 Type 2 diabetes mellitus without complications: Secondary | ICD-10-CM

## 2024-06-10 MED ORDER — DEXCOM G6 RECEIVER DEVI: 1.0000 | Freq: Two times a day (BID) | 0 refills | Status: DC

## 2024-06-10 MED ORDER — DEXCOM G6 SENSOR MISC
1.0000 | Freq: Two times a day (BID) | 1 refills | Status: DC
Start: 2024-06-10 — End: 2024-07-16

## 2024-06-10 MED ORDER — DEXCOM G6 TRANSMITTER MISC
1.0000 | Freq: Two times a day (BID) | 3 refills | Status: DC
Start: 2024-06-10 — End: 2024-07-16

## 2024-06-10 NOTE — Telephone Encounter (Signed)
 Copied from CRM [id number]. Topic: Clinical - Prescription Issue >> Jun 10, 2024  1:50 PM Carlatta H wrote: Reason for CRM: The last 3 Continuous Glucose Sensor (FREESTYLE LIBRE 3 PLUS SENSOR) MISC [[id number]] havent been registering//she would like a different style called in //Please call the patient to advise  Dexcom system sent to pharmacy.

## 2024-06-11 DIAGNOSIS — J3089 Other allergic rhinitis: Secondary | ICD-10-CM | POA: Diagnosis not present

## 2024-06-11 DIAGNOSIS — J301 Allergic rhinitis due to pollen: Secondary | ICD-10-CM | POA: Diagnosis not present

## 2024-06-24 DIAGNOSIS — J3089 Other allergic rhinitis: Secondary | ICD-10-CM | POA: Diagnosis not present

## 2024-06-24 DIAGNOSIS — J301 Allergic rhinitis due to pollen: Secondary | ICD-10-CM | POA: Diagnosis not present

## 2024-06-29 DIAGNOSIS — R0789 Other chest pain: Secondary | ICD-10-CM | POA: Diagnosis not present

## 2024-06-29 DIAGNOSIS — I10 Essential (primary) hypertension: Secondary | ICD-10-CM | POA: Diagnosis not present

## 2024-06-29 DIAGNOSIS — R519 Headache, unspecified: Secondary | ICD-10-CM | POA: Diagnosis not present

## 2024-06-29 DIAGNOSIS — R42 Dizziness and giddiness: Secondary | ICD-10-CM | POA: Diagnosis not present

## 2024-06-29 DIAGNOSIS — Z87891 Personal history of nicotine dependence: Secondary | ICD-10-CM | POA: Diagnosis not present

## 2024-06-29 DIAGNOSIS — R079 Chest pain, unspecified: Secondary | ICD-10-CM | POA: Diagnosis not present

## 2024-06-29 DIAGNOSIS — G44209 Tension-type headache, unspecified, not intractable: Secondary | ICD-10-CM | POA: Diagnosis not present

## 2024-07-01 ENCOUNTER — Ambulatory Visit (INDEPENDENT_AMBULATORY_CARE_PROVIDER_SITE_OTHER): Admitting: Family Medicine

## 2024-07-01 ENCOUNTER — Encounter: Payer: Self-pay | Admitting: Family Medicine

## 2024-07-01 DIAGNOSIS — I10 Essential (primary) hypertension: Secondary | ICD-10-CM

## 2024-07-01 MED ORDER — HYDROCHLOROTHIAZIDE 25 MG PO TABS
ORAL_TABLET | ORAL | 1 refills | Status: DC
Start: 2024-07-01 — End: 2024-07-16

## 2024-07-01 NOTE — Assessment & Plan Note (Addendum)
 Prescribed hydrochlorothiazide  25 mg to take daily. Endorses going one week without taking. Blood pressure elevated significantly and she went to the ED. Endorses taking hydrochlorothiazide  today and blood pressure is well controlled. Encouraged to take medication daily and to continue to monitor at home. She will follow-up in October for medication management. Potassium level slightly decreased in the ED. Encouraged potassium rich foods daily.  Refill sent today.

## 2024-07-01 NOTE — Progress Notes (Signed)
 Established Patient Office Visit  Subjective   Patient ID: Janet Richards, female    DOB: Nov 29, 1960  Age: 63 y.o. MRN: [id number]  Chief Complaint  Patient presents with   Follow-up    High blood pressure     Presents to follow-up for high blood pressure. Went to ED on Saturday due to blood pressure: 180/ Taking hydrochlorothiazide  25 mg as needed. Endorses not taking daily. Off hydrochlorothiazide  for one week.  Checks BP daily. Took hydrochlorothiazide  25 mg today. Well controlled in office.  CMP done on 06/29/24 in ED K+ 3.6 Encouraged to eat potassium rich foods.       ROS    Objective:     BP 113/62 (Patient Position: Sitting, Cuff Size: Large)   Pulse 82   Temp (!) 96.9 F (36.1 C)   Resp 18   Ht 5' 3 (1.6 m)   Wt 180 lb (81.6 kg)   LMP 09/11/2017 (Approximate)   SpO2 100%   BMI 31.89 kg/m  BP Readings from Last 3 Encounters:  07/01/24 113/62  06/05/24 130/68  05/08/24 136/87      Physical Exam Vitals and nursing note reviewed.  Constitutional:      General: She is not in acute distress.    Appearance: Normal appearance.  Cardiovascular:     Rate and Rhythm: Normal rate and regular rhythm.     Heart sounds: Normal heart sounds.  Pulmonary:     Effort: Pulmonary effort is normal.     Breath sounds: Normal breath sounds.  Skin:    General: Skin is warm and dry.  Neurological:     General: No focal deficit present.     Mental Status: She is alert. Mental status is at baseline.  Psychiatric:        Mood and Affect: Mood normal.        Behavior: Behavior normal.        Thought Content: Thought content normal.        Judgment: Judgment normal.       07/01/2024    4:41 PM 01/11/2024    1:31 PM 12/01/2022    3:52 PM 10/10/2022    2:22 PM 06/29/2022    3:39 PM  Depression screen PHQ 2/9  Decreased Interest 1 0 2 0 0  Down, Depressed, Hopeless 0 0 0 0 0  PHQ - 2 Score 1 0 2 0 0  Altered sleeping 2 0 2    Tired, decreased energy 2 0 2     Change in appetite 1 0 2    Feeling bad or failure about yourself  0 0 0    Trouble concentrating 0 0 2    Moving slowly or fidgety/restless 0 0 2    Suicidal thoughts 0 0 0    PHQ-9 Score 6 0 12    Difficult doing work/chores Not difficult at all Not difficult at all        No results found for any visits on 07/01/24.  Last metabolic panel Lab Results  Component Value Date   GLUCOSE 111 (H) 06/05/2024   NA 142 06/05/2024   K 4.3 06/05/2024   CL 99 06/05/2024   CO2 28 06/05/2024   BUN 12 06/05/2024   CREATININE 0.87 06/05/2024   EGFR 76 06/05/2024   CALCIUM 9.8 06/05/2024   PROT 7.0 05/08/2024   ALBUMIN 4.5 05/08/2024   LABGLOB 2.5 05/08/2024   BILITOT 0.4 05/08/2024   ALKPHOS 86 05/08/2024   AST 17  05/08/2024   ALT 23 05/08/2024   ANIONGAP 10 11/21/2022   Last hemoglobin A1c Lab Results  Component Value Date   HGBA1C 6.2 (H) 06/05/2024      The 10-year ASCVD risk score (Arnett DK, et al., 2019) is: 8.6%    Assessment & Plan:   Problem List Items Addressed This Visit     Essential hypertension (Chronic)   Prescribed hydrochlorothiazide  25 mg to take daily. Endorses going one week without taking. Blood pressure elevated significantly and she went to the ED. Endorses taking hydrochlorothiazide  today and blood pressure is well controlled. Encouraged to take medication daily and to continue to monitor at home. She will follow-up in October for medication management. Potassium level slightly decreased in the ED. Encouraged potassium rich foods daily.  Refill sent today.       Relevant Medications   hydrochlorothiazide  (HYDRODIURIL ) 25 MG tablet  Agrees with plan of care discussed.  Questions answered.   Return in about 2 months (around 09/05/2024) for HTN, DM (already scheduled) .    Darice JONELLE Brownie, FNP

## 2024-07-02 ENCOUNTER — Other Ambulatory Visit: Payer: Self-pay | Admitting: Family Medicine

## 2024-07-02 ENCOUNTER — Telehealth: Payer: Self-pay

## 2024-07-02 ENCOUNTER — Ambulatory Visit: Payer: Self-pay

## 2024-07-02 ENCOUNTER — Telehealth: Payer: Self-pay | Admitting: Family Medicine

## 2024-07-02 DIAGNOSIS — J3089 Other allergic rhinitis: Secondary | ICD-10-CM | POA: Diagnosis not present

## 2024-07-02 DIAGNOSIS — R053 Chronic cough: Secondary | ICD-10-CM | POA: Insufficient documentation

## 2024-07-02 DIAGNOSIS — Q839 Congenital malformation of breast, unspecified: Secondary | ICD-10-CM | POA: Insufficient documentation

## 2024-07-02 DIAGNOSIS — J301 Allergic rhinitis due to pollen: Secondary | ICD-10-CM | POA: Diagnosis not present

## 2024-07-02 NOTE — Telephone Encounter (Signed)
 I phoned Janet Richards on 8/26/225 and gave her the information regarding the mammogram and the chest x-ray. Janet Richards expressed understanding.  Yomaira Solar

## 2024-07-02 NOTE — Telephone Encounter (Signed)
 FYI Only or Action Required?: Action required by provider: referral request and update on patient condition.  Patient was last seen in primary care on 07/01/2024 by Booker Darice SAUNDERS, FNP.  Called Nurse Triage reporting Breast Problem and Cough.  Symptoms began several months ago.  Interventions attempted: OTC medications: Mucinex  and nasal spray.  Symptoms are: gradually worsening.  Triage Disposition: See PCP When Office is Open (Within 3 Days)  Patient/caregiver understands and will follow disposition?: No, refuses disposition                             Copied from CRM #[id number]. Topic: Clinical - Red Word Triage >> Jul 02, 2024  9:08 AM Marissa P wrote: Red Word that prompted transfer to Nurse Triage: Patient is having some tingling in her breast and has been experiencing this for months. Forgot to tell doctor for the xray. Reason for Disposition  Cough has been present for > 3 weeks  Answer Assessment - Initial Assessment Questions 1. SYMPTOM: What's the main symptom you're concerned about?  (e.g., lump, nipple discharge, pain, rash)     Tingling from nipples to both sides of breasts 2. LOCATION: Where is the tingling located?     Bilateral breasts 3. ONSET: When did tingling start?     3-4 months, increasing in consistency 4. PRIOR HISTORY: Do you have any history of prior problems with your breasts? (e.g., breast cancer, breast implant, fibrocystic breast disease)     States she has received a mammogram for this issue before 5. CAUSE: What do you think is causing this symptom?     Unsure  6. OTHER SYMPTOMS: Do you have any other symptoms? (e.g., breast pain, fever, nipple discharge, redness or rash)     States tingling occurs on and off, denies pain, denies fever, states she can lift both arms normally    Patient is requesting a referral for a mammogram or additional x-ray.  Answer Assessment - Initial Assessment Questions 1. ONSET:  When did the cough begin?      A few months 2. SEVERITY: How bad is the cough today?      States cough is more so annoying, denies frequent coughing spells  3. SPUTUM: Describe the color of your sputum (e.g., none, dry cough; clear, white, yellow, green)     Denies 5. DIFFICULTY BREATHING: Are you having difficulty breathing? If Yes, ask: How bad is it? (e.g., mild, moderate, severe)      Denies 6. FEVER: Do you have a fever? If Yes, ask: What is your temperature, how was it measured, and when did it start?     Denies 7. CARDIAC HISTORY: Do you have any history of heart disease? (e.g., heart attack, congestive heart failure)      Denies 8. LUNG HISTORY: Do you have any history of lung disease?  (e.g., pulmonary embolus, asthma, emphysema)     Denies 10. OTHER SYMPTOMS: Do you have any other symptoms? (e.g., runny nose, wheezing, chest pain)     Denies chest pain, denies runny nose, denies sore throat    Patient stated that she has been dealing with ongoing issues of mold and mildew in her places of living. Patient stated she forgot to mention cough at OV yesterday. Advised additional OV, given that cough has been present for months. Patient declined appointment. Patient stated she was recently evaluated in the ED and they ran all the tests. Patient has been taking Mucinex   and nasal spray for relief. Please advise.  Protocols used: Breast Symptoms-A-AH, Cough - Acute Non-Productive-A-AH

## 2024-07-02 NOTE — Telephone Encounter (Signed)
-----   Message from Darice JONELLE Brownie sent at 07/02/2024 12:56 PM EDT ----- Please call Ms. Gracia. I have ordered the diagnostic mammogram. This has to be done in [place] at the [facility]. They will call her for the appointment.  I also ordered a chest x-ray to be done at the [facility]. No appointment is necessary, she can just go there to get this done.  The address is:   [facility]  [address]  The radiology department is on the first floor which is best accessed by going around to the back of the building. No appointment necessary. You can go at your convenience.   Thank you, Darice

## 2024-07-02 NOTE — Telephone Encounter (Signed)
 Message sent to CMA to call Ms. Alejandro. Diagnostic mammogram ordered for bilateral breast tingling.  Chest x-ray ordered for cough from mold exposure.  Darice Brownie, NP

## 2024-07-03 ENCOUNTER — Ambulatory Visit (INDEPENDENT_AMBULATORY_CARE_PROVIDER_SITE_OTHER)

## 2024-07-03 ENCOUNTER — Telehealth: Payer: Self-pay

## 2024-07-03 ENCOUNTER — Other Ambulatory Visit: Payer: Self-pay | Admitting: Family Medicine

## 2024-07-03 DIAGNOSIS — R059 Cough, unspecified: Secondary | ICD-10-CM

## 2024-07-03 DIAGNOSIS — K0530 Chronic periodontitis, unspecified: Secondary | ICD-10-CM

## 2024-07-03 DIAGNOSIS — Z77120 Contact with and (suspected) exposure to mold (toxic): Secondary | ICD-10-CM

## 2024-07-03 MED ORDER — AMOXICILLIN-POT CLAVULANATE 875-125 MG PO TABS: 1.0000 | ORAL_TABLET | Freq: Two times a day (BID) | ORAL | 0 refills | Status: DC

## 2024-07-03 NOTE — Telephone Encounter (Signed)
 Copied from CRM #[id number]. Topic: Clinical - Medication Question >> Jul 03, 2024  1:46 PM [name] wrote: Reason for CRM: Pt was at the clinic on Aug 25th with her PCP, [name] and states that she requested an antibiotic, preferably augmentin  or cefalexin for her swollen gums, she states that those are the antibiotics that usually work for her, but prescription hasn't been called in for her, call her back if needed at [phone].

## 2024-07-04 ENCOUNTER — Ambulatory Visit: Payer: Self-pay | Admitting: Family Medicine

## 2024-07-05 DIAGNOSIS — K047 Periapical abscess without sinus: Secondary | ICD-10-CM | POA: Diagnosis not present

## 2024-07-05 DIAGNOSIS — E876 Hypokalemia: Secondary | ICD-10-CM | POA: Diagnosis not present

## 2024-07-05 DIAGNOSIS — R079 Chest pain, unspecified: Secondary | ICD-10-CM | POA: Diagnosis not present

## 2024-07-05 DIAGNOSIS — Z7982 Long term (current) use of aspirin: Secondary | ICD-10-CM | POA: Diagnosis not present

## 2024-07-05 DIAGNOSIS — R413 Other amnesia: Secondary | ICD-10-CM | POA: Diagnosis not present

## 2024-07-05 DIAGNOSIS — E878 Other disorders of electrolyte and fluid balance, not elsewhere classified: Secondary | ICD-10-CM | POA: Diagnosis not present

## 2024-07-05 DIAGNOSIS — E119 Type 2 diabetes mellitus without complications: Secondary | ICD-10-CM | POA: Diagnosis not present

## 2024-07-05 DIAGNOSIS — K219 Gastro-esophageal reflux disease without esophagitis: Secondary | ICD-10-CM | POA: Diagnosis not present

## 2024-07-05 DIAGNOSIS — E871 Hypo-osmolality and hyponatremia: Secondary | ICD-10-CM | POA: Diagnosis not present

## 2024-07-05 DIAGNOSIS — R519 Headache, unspecified: Secondary | ICD-10-CM | POA: Diagnosis not present

## 2024-07-05 DIAGNOSIS — I10 Essential (primary) hypertension: Secondary | ICD-10-CM | POA: Diagnosis not present

## 2024-07-09 NOTE — Telephone Encounter (Signed)
-----   Message from Darice JONELLE Brownie sent at 07/04/2024  6:02 AM EDT ----- Please notify Ms. Dowse. The chest x-ray does not show abnormalities. Lungs clear. Soft tissues within normal limits.  Thanks, Darice  ----- Message ----- From: Rebecka, Rad Results In Sent: 07/03/2024   5:09 PM EDT To: Darice JONELLE Brownie, FNP

## 2024-07-09 NOTE — Telephone Encounter (Signed)
 Spoke with Janet Richards on the phone to notify her of a negative cheat x-ray. She is currently in the hospital for elevated blood pressure. She has been started on new medication. Encouraged her to follow-up with PCP once discharged.

## 2024-07-11 ENCOUNTER — Encounter: Payer: Self-pay | Admitting: Family Medicine

## 2024-07-11 ENCOUNTER — Ambulatory Visit (INDEPENDENT_AMBULATORY_CARE_PROVIDER_SITE_OTHER): Admitting: Family Medicine

## 2024-07-11 VITALS — BP 142/80 | HR 86 | Temp 99.2°F | Ht 63.0 in | Wt 184.0 lb

## 2024-07-11 DIAGNOSIS — E871 Hypo-osmolality and hyponatremia: Secondary | ICD-10-CM | POA: Diagnosis not present

## 2024-07-11 DIAGNOSIS — Z09 Encounter for follow-up examination after completed treatment for conditions other than malignant neoplasm: Secondary | ICD-10-CM | POA: Diagnosis not present

## 2024-07-11 DIAGNOSIS — J301 Allergic rhinitis due to pollen: Secondary | ICD-10-CM | POA: Diagnosis not present

## 2024-07-11 DIAGNOSIS — E876 Hypokalemia: Secondary | ICD-10-CM | POA: Diagnosis not present

## 2024-07-11 DIAGNOSIS — J3089 Other allergic rhinitis: Secondary | ICD-10-CM | POA: Diagnosis not present

## 2024-07-11 NOTE — Assessment & Plan Note (Signed)
 Admitted 8/29-07/09/24 Went to hospital due to elevated blood pressure and felt off balance.  Diagnosed with Hyponatremia and hypokalemia. Na 121 K+ 3.1  Stopped hydrochlorothiazide  due to electrolyte imbalances Started amlodipine 2.5 mg daily. Blood pressure at home before this visit: 133/78  Elevated in office today.  Na on 9/2: 136 K+ 4.2 She will monitor her blood pressure at home and notify provider if it does not return to normal.

## 2024-07-11 NOTE — Assessment & Plan Note (Signed)
 Admitted to hospital for sodium level of 121. Hydrochlorothiazide  was discontinued and she was started on amlodipine  2.5 mg for her blood pressure.  She reports she was feeling off She received IV fluids. Last Na+ before discharge on 9/2: 136 BMP today.

## 2024-07-11 NOTE — Assessment & Plan Note (Signed)
 Admitted to hospital on 8/29: K+ was 3.1 on admission. Last lab before discharge on 9/2: K+ 4.2 BMP today.

## 2024-07-11 NOTE — Progress Notes (Signed)
 Established Patient Office Visit  Subjective   Patient ID: Janet Richards, female    DOB: 1961/02/17  Age: 63 y.o. MRN: [id number]  Chief Complaint  Patient presents with   Hospitalization Follow-up    Blood pressure went up Sodium and Potassium went down to dangerous levels    Admitted 8/29-07/09/24 Went to hospital due to elevated blood pressure and felt off balance.  Diagnosed with Hyponatremia and hypokalemia. Na 121 K+ 3.1  Stopped hydrochlorothiazide  due to electrolyte imbalances Started amlodipine 2.5 mg daily. Blood pressure at home before this visit: 133/78  Elevated in office today.  Na on 9/2: 136 K+ 4.2     ROS    Objective:     BP (!) 142/80 (Patient Position: Sitting, Cuff Size: Large)   Pulse 86   Temp 99.2 F (37.3 C) (Oral)   Ht 5' 3 (1.6 m)   Wt 184 lb (83.5 kg)   LMP 09/11/2017 (Approximate)   SpO2 100%   BMI 32.59 kg/m  BP Readings from Last 3 Encounters:  07/11/24 (!) 142/80  07/01/24 113/62  06/05/24 130/68      Physical Exam Vitals and nursing note reviewed.  Constitutional:      General: She is not in acute distress.    Appearance: Normal appearance.  Cardiovascular:     Rate and Rhythm: Normal rate and regular rhythm.     Heart sounds: Normal heart sounds.  Pulmonary:     Effort: Pulmonary effort is normal.     Breath sounds: Normal breath sounds.  Skin:    General: Skin is warm and dry.  Neurological:     General: No focal deficit present.     Mental Status: She is alert. Mental status is at baseline.  Psychiatric:        Mood and Affect: Mood normal.        Behavior: Behavior normal.        Thought Content: Thought content normal.        Judgment: Judgment normal.      No results found for any visits on 07/11/24.  Last metabolic panel Lab Results  Component Value Date   GLUCOSE 111 (H) 06/05/2024   NA 142 06/05/2024   K 4.3 06/05/2024   CL 99 06/05/2024   CO2 28 06/05/2024   BUN 12 06/05/2024    CREATININE 0.87 06/05/2024   EGFR 76 06/05/2024   CALCIUM 9.8 06/05/2024   PROT 7.0 05/08/2024   ALBUMIN 4.5 05/08/2024   LABGLOB 2.5 05/08/2024   BILITOT 0.4 05/08/2024   ALKPHOS 86 05/08/2024   AST 17 05/08/2024   ALT 23 05/08/2024   ANIONGAP 10 11/21/2022      The 10-year ASCVD risk score (Arnett DK, et al., 2019) is: 13.3%    Assessment & Plan:   Problem List Items Addressed This Visit     Hypokalemia   Admitted to hospital on 8/29: K+ was 3.1 on admission. Last lab before discharge on 9/2: K+ 4.2 BMP today.       Relevant Orders   Basic metabolic panel with [facility]   Hospital discharge follow-up - Primary   Admitted 8/29-07/09/24 Went to hospital due to elevated blood pressure and felt off balance.  Diagnosed with Hyponatremia and hypokalemia. Na 121 K+ 3.1  Stopped hydrochlorothiazide  due to electrolyte imbalances Started amlodipine 2.5 mg daily. Blood pressure at home before this visit: 133/78  Elevated in office today.  Na on 9/2: 136 K+ 4.2 She will monitor her blood  pressure at home and notify provider if it does not return to normal.       Hyponatremia   Admitted to hospital for sodium level of 121. Hydrochlorothiazide  was discontinued and she was started on amlodipine 2.5 mg for her blood pressure.  She reports she was feeling off She received IV fluids. Last Na+ before discharge on 9/2: 136 BMP today.       Relevant Orders   Basic metabolic panel with GFR  Agrees with plan of care discussed.  Questions answered.   Return if symptoms worsen or fail to improve.    Darice JONELLE Brownie, FNP

## 2024-07-12 ENCOUNTER — Ambulatory Visit: Payer: Self-pay | Admitting: Family Medicine

## 2024-07-12 DIAGNOSIS — R079 Chest pain, unspecified: Secondary | ICD-10-CM | POA: Diagnosis not present

## 2024-07-12 LAB — BASIC METABOLIC PANEL WITH GFR
BUN/Creatinine Ratio: 24 (ref 12–28)
BUN: 12 mg/dL (ref 8–27)
CO2: 26 mmol/L (ref 20–29)
Calcium: 10.1 mg/dL (ref 8.7–10.3)
Chloride: 97 mmol/L (ref 96–106)
Creatinine, Ser: 0.5 mg/dL — ABNORMAL LOW (ref 0.57–1.00)
Glucose: 90 mg/dL (ref 70–99)
Potassium: 5.1 mmol/L (ref 3.5–5.2)
Sodium: 139 mmol/L (ref 134–144)
eGFR: 107 mL/min/1.73 (ref >59)

## 2024-07-16 ENCOUNTER — Ambulatory Visit: Admission: EM | Admit: 2024-07-16 | Discharge: 2024-07-16 | Disposition: A | Discharge status: Home / Self Care

## 2024-07-16 DIAGNOSIS — R6889 Other general symptoms and signs: Secondary | ICD-10-CM | POA: Diagnosis not present

## 2024-07-16 DIAGNOSIS — J3089 Other allergic rhinitis: Secondary | ICD-10-CM

## 2024-07-16 DIAGNOSIS — I10 Essential (primary) hypertension: Secondary | ICD-10-CM | POA: Diagnosis not present

## 2024-07-16 DIAGNOSIS — J309 Allergic rhinitis, unspecified: Secondary | ICD-10-CM

## 2024-07-16 LAB — POC COVID19/FLU A&B COMBO
Covid Antigen, POC: NEGATIVE
Influenza A Antigen, POC: NEGATIVE
Influenza B Antigen, POC: NEGATIVE

## 2024-07-16 MED ORDER — FLUTICASONE PROPIONATE 50 MCG/ACT NA SUSP: 1.0000 | Freq: Every day | NASAL | 2 refills | Status: DC

## 2024-07-16 MED ORDER — CETIRIZINE HCL 10 MG PO TABS: 10.0000 mg | ORAL_TABLET | Freq: Every day | ORAL | 2 refills | Status: AC

## 2024-07-16 MED ORDER — AMLODIPINE BESYLATE 5 MG PO TABS: 5.0000 mg | ORAL_TABLET | Freq: Every day | ORAL | 2 refills | Status: DC

## 2024-07-16 MED ORDER — MONTELUKAST SODIUM 10 MG PO TABS: 10.0000 mg | ORAL_TABLET | Freq: Every day | ORAL | 2 refills | Status: DC

## 2024-07-16 NOTE — ED Provider Notes (Addendum)
 [facility]    CSN: [id number] Arrival date & time: 07/16/24  1449    HISTORY   Chief Complaint  Patient presents with   Hypertension   Cough   HPI Janet Richards is a pleasant, 63 y.o. female who presents to urgent care today. Pt states she is a Engineer, civil (consulting).  States that for the past 5 or 6 days, she has been having nasal congestion, nonproductive cough, shortness of breath, cough, eyes watering, headache, and hypertension.  States she was recently hospitalized for hyponatremia and was advised to stop taking hydrochlorothiazide .  Reports BP of 170/90 and 154/78 prior to arrival today, states that she just took her blood pressure medication, amlodipine  2.5 mg, which was prescribed by the hospitalist, a few minutes prior to arrival..  States she has seen her PCP since she was discharged from the hospital.  Pt states she has been taking Tylenol and using saline spray for her symptoms.  Pt also states she took eight  Children's Chewable Aspirin  81 mg for a dose of 324 mg.  When asked, patient states she is not currently taking Flonase  or cetirizine  that she has been prescribed multiple times in the past for her seasonal allergies.  Patient denies chest pain, shortness of breath, altered mental status,'s vision changes, fever, body aches, chills, nausea, vomiting, diarrhea, loss of taste or smell, known sick contacts.  The history is provided by the patient.  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  Patient  instructed by provider to lose weight before tubal reversal.     Last Assessment & Plan:   Formatting of this note might be different from the original.   Obesity is complicated by hyperlipidemia, hypertension and prediabetes.     Continue healthy cardiac diet and exercise.     Contact dermatitis 08/18/2022   DDD (degenerative disc disease), cervical 02/22/2018   Dermatitis due to drugs and medicines taken internally 08/18/2022   Dyspepsia 07/31/2018   Dysuria 09/16/2019   Eczema 08/18/2022   Ptd prescribed elidel.     Encounter for screening for metabolic disorder 01/11/2024   Environmental allergies 09/12/2018   Mold, trees, dust mites, mildew. 2019     Essential hypertension 03/01/2013   Essential hypertension, benign    Exposure to mold 01/11/2024   Ganglion cyst of right foot 07/12/2016   Generalized headaches 02/18/2020   GERD (gastroesophageal reflux disease) 02/18/2020   History of colonic polyps 03/20/2015   May 2016 diverticulosis single 5mm polyp repeat 03/2020     History of tubal ligation 09/17/2021   Hyperlipidemia 02/20/2015   AHA 10 year risk of 3.5% April 2016  No access to HDL total cholesterol 238 88/7981. Pt would like to continue lifestyle changes and recheck in 6 months to determine statin usage.      Irritation of ear, bilateral 03/09/2020   Lateral epicondylitis of elbow 08/18/2022   Pth has been using rt hand has tenosynovitis radial aspect of hand  and also epicondylitis of rt elbow.,  Tendero on compression of epicondyles.  Pt has been using rt side instead of left having had a sprain left wrist at work.     Low back pain 08/18/2022   Mild intermittent asthma without complication 07/31/2018   NAFLD (nonalcoholic fatty liver disease) 90/89/7975   No blood products 03/28/2013   [employer]: Signed papers scanned May 2014 into EMR     Numbness and tingling of both feet 09/02/2016   Osteoarthritis of first metatarsophalangeal joint 04/11/2014    Osteoarthritis of knee 09/17/2021   Other allergic rhinitis 01/14/2015   Overweight 08/18/2022   Patient instructed by provider to lose weight before tubal reversal.     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   Chronic cough 07/02/2024   Urgency of urination 06/05/2024   Yellow nails 03/06/2024   Allergy    Essential hypertension, benign    Urticaria    Preop cardiovascular exam    Type 2 diabetes mellitus without complication, without long-term current use of insulin (HCC) 02/22/2024   Pre-op evaluation 02/21/2024   Abnormal EKG 02/21/2024   Acute non-recurrent maxillary sinusitis 01/30/2024   Encounter for screening for metabolic disorder 01/11/2024   Exposure to mold 01/11/2024   NAFLD (nonalcoholic fatty liver disease) 90/89/7975   Asthma 08/18/2022   Contact dermatitis 08/18/2022   Dermatitis due to drugs and medicines taken internally 08/18/2022   Eczema 08/18/2022   Lateral epicondylitis of elbow 08/18/2022   Low back pain 08/18/2022   Overweight 08/18/2022   History of tubal ligation 09/17/2021   Osteoarthritis of knee 09/17/2021   Vaccine counseling 04/09/2020   Palpitations 03/09/2020   Irritation of ear, bilateral 03/09/2020   Generalized headaches 02/18/2020   GERD (gastroesophageal reflux disease) 02/18/2020   Class 1 obesity due to excess calories with serious comorbidity and body mass index (BMI) of 33.0 to 33.9 in adult  01/19/2020   Dysuria 09/16/2019   Chronic pancreatitis (HCC) 04/10/2019   Environmental allergies 09/12/2018   Mild intermittent asthma without complication 07/31/2018   Dyspepsia 07/31/2018   Sensory neuronopathy 07/29/2018   Post-menopausal bleeding 05/21/2018   DDD (degenerative disc disease), cervical 02/22/2018   Periodontitis 12/15/2017   Vaginal dryness, menopausal 03/01/2017   Abnormal ankle brachial index (ABI) 11/09/2016   Numbness and tingling of both feet 09/02/2016   Ganglion cyst of right foot 07/12/2016   Hypokalemia 05/24/2016   Chronic pain of right ankle 02/24/2016   History of colonic polyps 03/20/2015   Hyperlipidemia 02/20/2015   Other allergic rhinitis 01/14/2015   Prediabetes 10/17/2014   Osteoarthritis of first metatarsophalangeal joint 04/11/2014   Chronic pain of left knee 02/25/2014   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 Authorizing Provider  acetaminophen (TYLENOL) 500 MG tablet Take 500 mg by mouth every 6 (six) hours as needed.   Yes [provider]  amLODipine  (NORVASC ) 5 MG tablet Take 1 tablet (5 mg total) by mouth daily. 07/16/24 10/14/24 Yes Joesph Shaver Scales, PA-C  aspirin  81 MG chewable tablet Chew by mouth daily.   Yes [provider]  fluticasone  (FLONASE ) 50 MCG/ACT nasal spray Place 1 spray into both nostrils daily. Begin by using 2 sprays in each nare daily for 3 to 5 days, then decrease to 1 spray in each nare daily. 07/16/24  Yes Joesph Shaver  Scales, PA-C  montelukast  (SINGULAIR ) 10 MG tablet Take 1 tablet (10 mg total) by mouth at bedtime. 07/16/24 10/14/24 Yes Joesph Shaver Scales, PA-C  omeprazole  (PRILOSEC OTC) 20 MG tablet Take 20 mg by mouth daily.   Yes [provider]  aspirin  EC 325 MG tablet Take 1 tablet (325 mg total) by mouth daily. 02/12/24   Genelle Standing, MD  cetirizine  (ZYRTEC ) 10 MG tablet Take 1 tablet (10 mg total) by mouth daily. 07/16/24 10/14/24  Joesph Shaver Scales, PA-C  chlorhexidine  (PERIDEX ) 0.12 % solution Use as directed 15 mLs in the mouth or throat 2 (two) times daily. 02/27/24   Booker Darice SAUNDERS, FNP  Continuous Glucose Sensor (FREESTYLE LIBRE 3 PLUS SENSOR) MISC Change sensor every 15 days. 04/02/24   Booker Darice SAUNDERS, FNP  ibuprofen  (ADVIL ) 600 MG tablet Take 1 tablet (600 mg total) by mouth every 6 (six) hours as needed. 09/09/22   Silver Wonda LABOR, PA  Nutritional Supplements (IMMUNOCAL PO) Take 1 tablet by mouth daily.    [provider]    Family History Family History  Problem Relation Age of Onset   Arthritis Mother    Hypertension Mother    CAD Father        MI in his 23s   Allergic rhinitis Father    Urticaria Sister    Colon cancer Sister    Allergic rhinitis Son    Asthma Son    Angioedema Neg Hx    Eczema Neg Hx    Immunodeficiency Neg Hx    Esophageal cancer Neg Hx    Stomach cancer Neg Hx    Rectal cancer Neg Hx    Social History Social History   Tobacco Use   Smoking status: Former    Current packs/day: 0.00    Types: Cigarettes    Quit date: 02/05/2006    Years since quitting: 18.4    Passive exposure: Never   Smokeless tobacco: Never  Vaping Use   Vaping status: Never Used  Substance Use Topics   Alcohol use: Not Currently   Drug use: No   Allergies   Azithromycin , Clindamycin /lincomycin, Erythromycin, and Rosuvastatin  Review of Systems Review of Systems  Respiratory:  Positive for cough.    Pertinent findings revealed after performing a 14  point review of systems has been noted in the history of present illness.  Physical Exam Vital Signs BP (!) 170/108 (BP Location:  Right Arm)   Pulse 97   Temp 98.2 F (36.8 C) (Oral)   Resp 15   LMP 09/11/2017 (Approximate)   SpO2 98%   No data found.  Physical Exam Vitals and nursing note reviewed.  Constitutional:      General: She is awake. She is not in acute distress.    Appearance: Normal appearance. She is well-developed and well-groomed. She is not ill-appearing.  HENT:     Head: Normocephalic and atraumatic.     Salivary Glands: Right salivary gland is not diffusely enlarged or tender. Left salivary gland is not diffusely enlarged or tender.     Right Ear: Hearing, ear canal and external ear normal. A middle ear effusion is present. Tympanic membrane is bulging. Tympanic membrane is not injected or erythematous.     Left Ear: Hearing, ear canal and external ear normal. A middle ear effusion is present. Tympanic membrane is bulging. Tympanic membrane is not injected or erythematous.     Ears:     Comments: Bilateral EACs normal, both TMs bulging with clear fluid    Nose: Rhinorrhea present. No nasal deformity, septal deviation, signs of injury or nasal tenderness. Rhinorrhea is clear.     Right Nostril: Occlusion present. No foreign body, epistaxis or septal hematoma.     Left Nostril: Occlusion present. No foreign body, epistaxis or septal hematoma.     Right Turbinates: Enlarged, swollen and pale.     Left Turbinates: Enlarged, swollen and pale.     Right Sinus: No maxillary sinus tenderness or frontal sinus tenderness.     Left Sinus: No maxillary sinus tenderness or frontal sinus tenderness.     Mouth/Throat:     Lips: Pink. No lesions.     Mouth: Mucous membranes are moist. No oral lesions.     Tongue: No lesions. Tongue does not deviate from midline.     Palate: No mass and lesions.     Pharynx: Oropharynx is clear. Uvula midline. Postnasal drip present. No  pharyngeal swelling, oropharyngeal exudate, posterior oropharyngeal erythema or uvula swelling.     Tonsils: No tonsillar exudate. 0 on the right. 0 on the left.     Comments: Postnasal drip Eyes:     General: Lids are normal.        Right eye: No discharge.        Left eye: No discharge.     Conjunctiva/sclera: Conjunctivae normal.     Right eye: Right conjunctiva is not injected.     Left eye: Left conjunctiva is not injected.  Neck:     Trachea: Trachea and phonation normal.  Cardiovascular:     Rate and Rhythm: Normal rate and regular rhythm.  Pulmonary:     Effort: Pulmonary effort is normal.     Breath sounds: Normal breath sounds.  Chest:     Chest wall: No tenderness.  Musculoskeletal:        General: Normal range of motion.     Cervical back: Full passive range of motion without pain, normal range of motion and neck supple. Normal range of motion.  Lymphadenopathy:     Cervical: No cervical adenopathy.  Skin:    General: Skin is warm and dry.     Findings: No erythema or rash.  Neurological:     General: No focal deficit present.     Mental Status: She is alert and oriented to person, place, and time. Mental status is at baseline.  Psychiatric:  Attention and Perception: Attention and perception normal.        Mood and Affect: Mood and affect normal.        Speech: Speech normal.        Behavior: Behavior normal. Behavior is cooperative.        Thought Content: Thought content normal.     Visual Acuity Right Eye Distance:   Left Eye Distance:   Bilateral Distance:    Right Eye Near:   Left Eye Near:    Bilateral Near:     UC Couse / Diagnostics / Procedures:     Radiology No results found.  Procedures Procedures (including critical care time) EKG  Pending results:  Labs Reviewed  POC COVID19/FLU A&B COMBO    Medications Ordered in UC: Medications - No data to display  UC Diagnoses / Final Clinical Impressions(s)   I have reviewed the  triage vital signs and the nursing notes.  Pertinent labs & imaging results that were available during my care of the patient were reviewed by me and considered in my medical decision making (see chart for details).    Final diagnoses:  Essential hypertension  Allergic rhinitis, unspecified seasonality, unspecified trigger  Feeling unwell   Patient advised physical exam findings are concerning for uncontrolled seasonal allergies.  Patient insisted on COVID-19 and influenza testing today despite this information.  COVID-19 and influenza test today were negative, patient advised.  Recommend resume all allergy medications and take them every day.  Patient provided with a higher dose of amlodipine  and advised to follow-up with her primary care provider again.  Patient also educated that it is very important that she takes her blood pressure medicine same time every day.  Please see discharge instructions below for details of plan of care as provided to patient. ED Prescriptions     Medication Sig Dispense Auth. Provider   amLODipine  (NORVASC ) 5 MG tablet Take 1 tablet (5 mg total) by mouth daily. 30 tablet Joesph Shaver Scales, PA-C   cetirizine  (ZYRTEC ) 10 MG tablet Take 1 tablet (10 mg total) by mouth daily. 30 tablet Joesph Shaver Scales, PA-C   montelukast  (SINGULAIR ) 10 MG tablet Take 1 tablet (10 mg total) by mouth at bedtime. 30 tablet Joesph Shaver Scales, PA-C   fluticasone  (FLONASE ) 50 MCG/ACT nasal spray Place 1 spray into both nostrils daily. Begin by using 2 sprays in each nare daily for 3 to 5 days, then decrease to 1 spray in each nare daily. 15.8 mL Joesph Shaver Scales, PA-C      PDMP not reviewed this encounter.  Pending results:  Labs Reviewed  POC COVID19/FLU A&B COMBO      Discharge Instructions      I have increased your dose for amlodipine  to 5 mg.  Please begin taking this tomorrow since you have already taken your 2.5 mg dose today.  Your symptoms  and my physical exam findings are concerning for exacerbation of your underlying allergies.     To avoid catching frequent respiratory infections, having skin reactions, dealing with eye irritation, losing sleep, missing work, etc., due to uncontrolled allergies, it is important that you begin/continue your allergy regimen and are consistent with taking your meds exactly as prescribed.   Please read below to learn more about the medications, dosages and frequencies that I recommend to help alleviate your symptoms and to get you feeling better soon:   Zyrtec  (cetirizine ): This is an excellent second-generation antihistamine that helps to reduce respiratory inflammatory response to  environmental allergens.  In some patients, this medication can cause daytime sleepiness so I recommend that you take 1 tablet daily at bedtime.   Singulair  (montelukast ): This is a mast cell stabilizer that works well with antihistamines.  Mast cells are responsible for stimulating histamine production.  Reducing the activity of mast cells decreases the amount of histamines will be produced.  This reduces upper and lower respiratory inflammation caused by allergy exposure.  I recommend that you take this medication at the same time you take your antihistamine.   Flonase  (fluticasone ): This is a steroid nasal spray that used once daily, 1 spray in each nare.  This works best when used on a daily basis. This medication does not work well if it is only used when you think you need it.  After 3 to 5 days of use, you will notice significant reduction of the inflammation and mucus production that is currently being caused by exposure to allergens, whether seasonal or environmental.  The most common side effect of this medication is nosebleeds.  If you experience a nosebleed, please discontinue use for 1 week, then feel free to resume.  If you find that your insurance will not pay for this medication, please consider a different nasal  steroids such as Nasonex (mometasone), or Nasacort  (triamcinolone ).   If symptoms have not meaningfully improved in the next 5 to 7 days, please return for repeat evaluation or follow-up with your regular provider.  If symptoms have worsened in the next 3 to 5 days, please return for repeat evaluation or follow-up with your regular provider.    Thank you for visiting urgent care today.  We appreciate the opportunity to participate in your care.  Your COVID-19 and influenza test today were all negative.     Disposition Upon Discharge:  Condition: stable for discharge home  Patient presented with an acute illness with associated systemic symptoms and significant discomfort requiring urgent management. In my opinion, this is a condition that a prudent lay person (someone who possesses an average knowledge of health and medicine) may potentially expect to result in complications if not addressed urgently such as respiratory distress, impairment of bodily function or dysfunction of bodily organs.   Routine symptom specific, illness specific and/or disease specific instructions were discussed with the patient and/or caregiver at length.   As such, the patient has been evaluated and assessed, work-up was performed and treatment was provided in alignment with urgent care protocols and evidence based medicine.  Patient/parent/caregiver has been advised that the patient may require follow up for further testing and treatment if the symptoms continue in spite of treatment, as clinically indicated and appropriate.  Patient/parent/caregiver has been advised to return to the [facility] or PCP if no better; to PCP or the Emergency Department if new signs and symptoms develop, or if the current signs or symptoms continue to change or worsen for further workup, evaluation and treatment as clinically indicated and appropriate  The patient will follow up with their current PCP if and as advised. If the patient does not  currently have a PCP we will assist them in obtaining one.   The patient may need specialty follow up if the symptoms continue, in spite of conservative treatment and management, for further workup, evaluation, consultation and treatment as clinically indicated and appropriate.  Patient/parent/caregiver verbalized understanding and agreement of plan as discussed.  All questions were addressed during visit.  Please see discharge instructions below for further details of plan.  This office  note has been dictated using Teaching laboratory technician.  Unfortunately, this method of dictation can sometimes lead to typographical or grammatical errors.  I apologize for your inconvenience in advance if this occurs.  Please do not hesitate to reach out to me if clarification is needed.      Joesph Shaver Scales, PA-C 07/16/24 1644    Joesph Shaver Scales, [place] 07/16/24 [phone]

## 2024-07-16 NOTE — ED Triage Notes (Addendum)
 Pt states since last Friday or Saturday she started having nasal congestion, cough, shortness of breath, cough, eyes watering, headache, and hypertension. Today she had readings of 170/90 and 154/78 before coming in. She has been having issues with her BP and was seen on 8/29. Pt states she took her blood pressure medication a few minutes ago. Pt also states she took 8 of her Children's Chewable Aspirin  81 mg to get to 324 mg.   Home Interventions: Childrens Chewable Aspirin  today and Tylenol today; Saline spray

## 2024-07-16 NOTE — Discharge Instructions (Addendum)
 I have increased your dose for amlodipine  to 5 mg.  Please begin taking this tomorrow since you have already taken your 2.5 mg dose today.  Your symptoms and my physical exam findings are concerning for exacerbation of your underlying allergies.     To avoid catching frequent respiratory infections, having skin reactions, dealing with eye irritation, losing sleep, missing work, etc., due to uncontrolled allergies, it is important that you begin/continue your allergy regimen and are consistent with taking your meds exactly as prescribed.   Please read below to learn more about the medications, dosages and frequencies that I recommend to help alleviate your symptoms and to get you feeling better soon:   Zyrtec  (cetirizine ): This is an excellent second-generation antihistamine that helps to reduce respiratory inflammatory response to environmental allergens.  In some patients, this medication can cause daytime sleepiness so I recommend that you take 1 tablet daily at bedtime.   Singulair  (montelukast ): This is a mast cell stabilizer that works well with antihistamines.  Mast cells are responsible for stimulating histamine production.  Reducing the activity of mast cells decreases the amount of histamines will be produced.  This reduces upper and lower respiratory inflammation caused by allergy exposure.  I recommend that you take this medication at the same time you take your antihistamine.   Flonase  (fluticasone ): This is a steroid nasal spray that used once daily, 1 spray in each nare.  This works best when used on a daily basis. This medication does not work well if it is only used when you think you need it.  After 3 to 5 days of use, you will notice significant reduction of the inflammation and mucus production that is currently being caused by exposure to allergens, whether seasonal or environmental.  The most common side effect of this medication is nosebleeds.  If you experience a nosebleed, please  discontinue use for 1 week, then feel free to resume.  If you find that your insurance will not pay for this medication, please consider a different nasal steroids such as Nasonex (mometasone), or Nasacort  (triamcinolone ).   If symptoms have not meaningfully improved in the next 5 to 7 days, please return for repeat evaluation or follow-up with your regular provider.  If symptoms have worsened in the next 3 to 5 days, please return for repeat evaluation or follow-up with your regular provider.    Thank you for visiting urgent care today.  We appreciate the opportunity to participate in your care.  Your COVID-19 and influenza test today were all negative.

## 2024-07-22 NOTE — Telephone Encounter (Signed)
 Please call Janet Richards, she will need to schedule a visit to discuss this as provder has full schedule and unable to call.      Copied from CRM #[id number]. Topic: General - Other >> Jul 22, 2024  8:55 AM Charlet HERO wrote: Reason for CRM: [name] would like to have a call back from Dr Booker about her hospital stay, she is stating that she thinks that she thinks that her allergy shots are the reason her B/P is continually going up and she would like to discuss with her.

## 2024-07-22 NOTE — Telephone Encounter (Signed)
-----   Message from Darice JONELLE Brownie, FNP sent at 07/22/2024  9:05 AM EDT -----

## 2024-07-23 ENCOUNTER — Other Ambulatory Visit: Payer: Self-pay | Admitting: Family Medicine

## 2024-07-23 ENCOUNTER — Telehealth (HOSPITAL_BASED_OUTPATIENT_CLINIC_OR_DEPARTMENT_OTHER): Payer: Self-pay | Admitting: *Deleted

## 2024-07-23 DIAGNOSIS — E119 Type 2 diabetes mellitus without complications: Secondary | ICD-10-CM

## 2024-07-23 NOTE — Telephone Encounter (Unsigned)
 Copied from CRM #[id number]. Topic: Clinical - Medication Refill >> Jul 23, 2024  3:34 PM Debby BROCKS wrote: Medication: Continuous Glucose Sensor (FREESTYLE LIBRE 3 PLUS SENSOR) MISC  Has the patient contacted their pharmacy? Yes (Agent: If no, request that the patient contact the pharmacy for the refill. If patient does not wish to contact the pharmacy document the reason why and proceed with request.) (Agent: If yes, when and what did the pharmacy advise?) No more refills available to contact PCP  This is the patient's preferred pharmacy:  [facility] [place], [place] - [address] [place] [address] [place] [address] Phone: [phone] Fax: [phone]  Is this the correct pharmacy for this prescription? Yes If no, delete pharmacy and type the correct one.   Has the prescription been filled recently? Yes  Is the patient out of the medication? Yes  Has the patient been seen for an appointment in the last year OR does the patient have an upcoming appointment? Yes  Can we respond through MyChart? No, text or phone  Agent: Please be advised that Rx refills may take up to 3 business days. We ask that you follow-up with your pharmacy.

## 2024-07-23 NOTE — Telephone Encounter (Signed)
 Copied from CRM [id number]. Topic: General - Other >> Jul 23, 2024  2:49 PM Fonda T wrote: Reason for CRM: Received call form patient, requesting screening mammogram appointment location be changed/rescheduled to the [facility] location instead of [place].  Patient can be reached at [phone], to follow up once appointment have been scheduled to preferred location in [place].

## 2024-07-23 NOTE — Telephone Encounter (Signed)
 Patient is not a patient of [facility].

## 2024-07-24 MED ORDER — FREESTYLE LIBRE 3 PLUS SENSOR MISC: 6 refills | Status: DC

## 2024-07-24 NOTE — Telephone Encounter (Signed)
 Copied from CRM [id number]. Topic: Clinical - Request for Lab/Test Order >> Jul 23, 2024  3:36 PM Debby BROCKS wrote: Reason for CRM: Patient would like a pap smear ordered so she may get one scheduled

## 2024-07-24 NOTE — Telephone Encounter (Signed)
 Tonya, Please call Ms. Fessel to schedule pap smear. She will need 40 minutes. Thank you.

## 2024-07-25 ENCOUNTER — Telehealth: Payer: Self-pay

## 2024-07-25 ENCOUNTER — Other Ambulatory Visit: Payer: Self-pay | Admitting: Family Medicine

## 2024-07-25 DIAGNOSIS — Z1231 Encounter for screening mammogram for malignant neoplasm of breast: Secondary | ICD-10-CM

## 2024-07-25 NOTE — Telephone Encounter (Signed)
 Patient called imaging and they stated that you ordered a diagnostic mammogram and not the screening mammogram. They looked to see the results of the last mammogram and they changed it to a screening mammogram.

## 2024-07-25 NOTE — Telephone Encounter (Signed)
 Please Advise   Copied from CRM [id number]. Topic: Referral - Question >> Jul 25, 2024  9:06 AM Mercedes MATSU wrote: Reason for CRM: Patient said the imaging facility do not do the other test that the doctor had ordered that they can only do the mammogram. Patient states fNP Darice still wants her to have that testing then she needs the referral sent to [place]. Patient is requesting a call back [phone].

## 2024-08-07 ENCOUNTER — Ambulatory Visit: Payer: Self-pay

## 2024-08-07 ENCOUNTER — Telehealth: Payer: Self-pay

## 2024-08-07 ENCOUNTER — Inpatient Hospital Stay: Admitting: Family Medicine

## 2024-08-07 NOTE — Telephone Encounter (Signed)
 YI Only or Action Required?: Action required by provider: request for appointment.  Patient was last seen in primary care on 07/11/2024 by Booker Darice SAUNDERS, FNP.  Called Nurse Triage reporting Joint Swelling.  Symptoms began several days ago.  Interventions attempted: OTC medications:   and Rest, hydration, or home remedies.  Symptoms are: unchanged.  Triage Disposition: See PCP When Office is Open (Within 3 Days)  Patient/caregiver understands and will follow disposition?: yes       Copied from CRM #[id number]. Topic: Clinical - Red Word Triage >> Aug 07, 2024  2:39 PM Wess RAMAN wrote: Red Word that prompted transfer to Nurse Triage: Fractured right ankle. Brace she received is broken is not working. Ankle is also swollen.   Patient is also experiencing any pain or injury related to physical activity, sports, or general orthopedic/joint issue for which she would like to see a sports medicine provider rather than her PCP Reason for Disposition  MODERATE ankle swelling (e.g., interferes with normal activities, can't move joint normally) (Exceptions: Itchy, localized swelling; swelling is chronic.)  Answer Assessment - Initial Assessment Questions Brace is broken - wanting sports med. Referral     1. LOCATION: Which ankle is swollen? Where is the swelling?     Right ankle fractured ankle   3. SWELLING: How bad is the swelling? Or, How large is it? (e.g., mild, moderate, severe; size of localized swelling)      Yes  4. PAIN: Is there any pain? If Yes, ask: How bad is it? (Scale 0-10; or none, mild, moderate, severe)     Only when walking  off and on  5. CAUSE: What do you think caused the ankle swelling?     fx 6. OTHER SYMPTOMS: Do you have any other symptoms? (e.g., fever, chest pain, difficulty breathing, calf pain)     no  Protocols used: Ankle Swelling-A-AH

## 2024-08-07 NOTE — Telephone Encounter (Signed)
 Spoke with patient and she is aware that she will need to make an appointment to receive a referral for sports meds. Patient states that she will call back to schedule an appointment with PCP

## 2024-08-07 NOTE — Telephone Encounter (Signed)
 Janet Richards called and asked for a referral to see  Dr. Joane at [facility]. She fractured her ankle and needs a referral. Please advise?

## 2024-08-11 DIAGNOSIS — Z860100 Personal history of colon polyps, unspecified: Secondary | ICD-10-CM | POA: Diagnosis not present

## 2024-08-11 DIAGNOSIS — A09 Infectious gastroenteritis and colitis, unspecified: Secondary | ICD-10-CM | POA: Diagnosis not present

## 2024-08-11 DIAGNOSIS — Z9851 Tubal ligation status: Secondary | ICD-10-CM | POA: Diagnosis not present

## 2024-08-11 DIAGNOSIS — K921 Melena: Secondary | ICD-10-CM | POA: Diagnosis not present

## 2024-08-11 DIAGNOSIS — K219 Gastro-esophageal reflux disease without esophagitis: Secondary | ICD-10-CM | POA: Diagnosis not present

## 2024-08-11 DIAGNOSIS — K529 Noninfective gastroenteritis and colitis, unspecified: Secondary | ICD-10-CM | POA: Diagnosis not present

## 2024-08-11 DIAGNOSIS — D509 Iron deficiency anemia, unspecified: Secondary | ICD-10-CM | POA: Diagnosis not present

## 2024-08-11 DIAGNOSIS — Z7982 Long term (current) use of aspirin: Secondary | ICD-10-CM | POA: Diagnosis not present

## 2024-08-11 DIAGNOSIS — E119 Type 2 diabetes mellitus without complications: Secondary | ICD-10-CM | POA: Diagnosis not present

## 2024-08-11 DIAGNOSIS — D649 Anemia, unspecified: Secondary | ICD-10-CM | POA: Diagnosis not present

## 2024-08-11 DIAGNOSIS — I10 Essential (primary) hypertension: Secondary | ICD-10-CM | POA: Diagnosis not present

## 2024-08-11 DIAGNOSIS — R000 Tachycardia, unspecified: Secondary | ICD-10-CM | POA: Diagnosis not present

## 2024-08-12 DIAGNOSIS — K529 Noninfective gastroenteritis and colitis, unspecified: Secondary | ICD-10-CM | POA: Diagnosis not present

## 2024-08-12 DIAGNOSIS — K921 Melena: Secondary | ICD-10-CM | POA: Diagnosis not present

## 2024-08-13 DIAGNOSIS — K922 Gastrointestinal hemorrhage, unspecified: Secondary | ICD-10-CM | POA: Diagnosis not present

## 2024-08-13 DIAGNOSIS — K529 Noninfective gastroenteritis and colitis, unspecified: Secondary | ICD-10-CM | POA: Diagnosis not present

## 2024-08-14 DIAGNOSIS — K922 Gastrointestinal hemorrhage, unspecified: Secondary | ICD-10-CM | POA: Diagnosis not present

## 2024-08-19 ENCOUNTER — Telehealth: Payer: Self-pay

## 2024-08-19 NOTE — Telephone Encounter (Signed)
 Noted and aware  Copied from CRM [id number]. Topic: Appointments - Scheduling Inquiry for Clinic >> Aug 16, 2024  2:04 PM Hadassah PARAS wrote: Reason for CRM: Pt was released from ER 10/8 needing follow up within two weeks. Nothing available with any providers within time frame. Please reach out for scheduling. Please advise [phone]   ----------------------------------------------------------------------- From previous Reason for Contact - Scheduling: Patient/patient representative is calling to schedule an appointment. Refer to attachments for appointment information.

## 2024-08-21 NOTE — Telephone Encounter (Signed)
Error , no encounter needed

## 2024-08-27 ENCOUNTER — Ambulatory Visit (INDEPENDENT_AMBULATORY_CARE_PROVIDER_SITE_OTHER): Admitting: Family Medicine

## 2024-08-27 ENCOUNTER — Encounter: Payer: Self-pay | Admitting: Family Medicine

## 2024-08-27 ENCOUNTER — Ambulatory Visit: Admitting: Family Medicine

## 2024-08-27 VITALS — BP 144/86 | HR 92 | Ht 63.0 in | Wt 183.0 lb

## 2024-08-27 DIAGNOSIS — K529 Noninfective gastroenteritis and colitis, unspecified: Secondary | ICD-10-CM | POA: Diagnosis not present

## 2024-08-27 DIAGNOSIS — Z124 Encounter for screening for malignant neoplasm of cervix: Secondary | ICD-10-CM

## 2024-08-27 DIAGNOSIS — Z8719 Personal history of other diseases of the digestive system: Secondary | ICD-10-CM

## 2024-08-27 DIAGNOSIS — I10 Essential (primary) hypertension: Secondary | ICD-10-CM

## 2024-08-27 DIAGNOSIS — Z8781 Personal history of (healed) traumatic fracture: Secondary | ICD-10-CM

## 2024-08-27 MED ORDER — AMLODIPINE BESYLATE 5 MG PO TABS: 5.0000 mg | ORAL_TABLET | Freq: Every day | ORAL | 1 refills | Status: AC

## 2024-08-27 NOTE — Assessment & Plan Note (Signed)
 Referral placed for evaluation. Pain is affecting her ability to exercise.

## 2024-08-27 NOTE — Assessment & Plan Note (Addendum)
 Reports getting frustrated when she comes to the office due to someone making her feel irritated. Blood pressure well controlled at home before this visit. She reports taking her amlodipine  2.5 mg while sitting in exam room today. Blood pressure improved, not at goal upon recheck. Encouraged her to take her blood pressure medication every day for optimal control.  Endorses missing some days.  Follow-up at next appointment for DM. Refill sent. DASH diet. Moderate exercise as tolerated due to right ankle pain.

## 2024-08-27 NOTE — Progress Notes (Signed)
 Established Patient Office Visit  Subjective   Patient ID: Janet Richards, female    DOB: 11/28/1960  Age: 63 y.o. MRN: [id number]  Chief Complaint  Patient presents with   Gynecologic Exam    Presents for pap smear today. Wants breast exam today.   Admitted 08/12/24-08/16/24  For Colitis Repeat CBC today.   Elevated blood pressure. Reports not taking blood pressure medication today. Reports not taking for last 5 days. 136/78 right before this visit Took amlodipine  while in the room.   Tingling in fingers and spasms in muscles.   Intermittently. 08/14/24: electrolytes normal.  Will watch and wait.   History of right ankle fracture: Referral placed today.          ROS    Objective:     BP (!) 144/86 (Patient Position: Sitting, Cuff Size: Large)   Pulse 92   Ht 5' 3 (1.6 m)   Wt 183 lb (83 kg)   LMP 09/11/2017 (Approximate)   SpO2 100%   BMI 32.42 kg/m    Physical Exam Vitals and nursing note reviewed. Exam conducted with a chaperone present.  Constitutional:      General: She is not in acute distress.    Appearance: Normal appearance.  Pulmonary:     Effort: Pulmonary effort is normal.  Chest:  Breasts:    Right: Normal. No nipple discharge, skin change or tenderness.     Left: Normal. No nipple discharge, skin change or tenderness.  Genitourinary:    General: Normal vulva.     Exam position: Lithotomy position.     Vagina: Normal.     Cervix: Normal.     Adnexa: Right adnexa normal and left adnexa normal.  Skin:    General: Skin is warm and dry.  Neurological:     General: No focal deficit present.     Mental Status: She is alert. Mental status is at baseline.  Psychiatric:        Mood and Affect: Mood normal.        Behavior: Behavior normal.        Thought Content: Thought content normal.        Judgment: Judgment normal.      No results found for any visits on 08/27/24.    The 10-year ASCVD risk score (Arnett DK, et al.,  2019) is: 13.6%    Assessment & Plan:   Problem List Items Addressed This Visit     Elevated blood pressure reading in office with diagnosis of hypertension   Reports getting frustrated when she comes to the office due to someone making her feel irritated. Blood pressure well controlled at home before this visit. She reports taking her amlodipine  2.5 mg while sitting in exam room today. Blood pressure improved, not at goal upon recheck. Encouraged her to take her blood pressure medication every day for optimal control.  Endorses missing some days.  Follow-up at next appointment for DM. Refill sent. DASH diet. Moderate exercise as tolerated due to right ankle pain.         Relevant Medications   amLODipine  (NORVASC ) 5 MG tablet   Screening for cervical cancer - Primary   Tolerated pap smear well.       Relevant Orders   IGP, Aptima HPV   History of GI bleed   Admitted to hospital 10/6-10/10 with bleeding. Diagnosed with colitis. 08/14/24:  Hemoglobin 11.9 Hematocrit 34.2 Repeat CBC today.  No further bleeding. Has not scheduled follow-up with GI yet,  wants to go to [facility] provider. Referral placed. She will likely need colonoscopy.       Relevant Orders   CBC with Differential/Platelet   Ambulatory referral to Gastroenterology   Colitis   Relevant Orders   Ambulatory referral to Gastroenterology   History of fracture of right ankle   Referral placed for evaluation. Pain is affecting her ability to exercise.        Relevant Orders   Ambulatory referral to Orthopedics  Agrees with plan of care discussed.  Questions answered.   Return for follow-up already on file.    Darice JONELLE Brownie, FNP

## 2024-08-27 NOTE — Assessment & Plan Note (Addendum)
 Admitted to hospital 10/6-10/10 with bleeding. Diagnosed with colitis. 08/14/24:  Hemoglobin 11.9 Hematocrit 34.2 Repeat CBC today.  No further bleeding. Has not scheduled follow-up with GI yet, wants to go to [facility] provider. Referral placed. She will likely need colonoscopy.

## 2024-08-27 NOTE — Assessment & Plan Note (Signed)
 Tolerated pap smear well.

## 2024-08-28 ENCOUNTER — Ambulatory Visit: Payer: Self-pay | Admitting: Family Medicine

## 2024-08-28 LAB — CBC WITH DIFFERENTIAL/PLATELET
Basophils Absolute: 0 x10E3/uL (ref 0.0–0.2)
Basos: 0 %
EOS (ABSOLUTE): 0.3 x10E3/uL (ref 0.0–0.4)
Eos: 5 %
Hematocrit: 39.8 % (ref 34.0–46.6)
Hemoglobin: 12.8 g/dL (ref 11.1–15.9)
Immature Grans (Abs): 0 x10E3/uL (ref 0.0–0.1)
Immature Granulocytes: 0 %
Lymphocytes Absolute: 2.4 x10E3/uL (ref 0.7–3.1)
Lymphs: 44 %
MCH: 29.6 pg (ref 26.6–33.0)
MCHC: 32.2 g/dL (ref 31.5–35.7)
MCV: 92 fL (ref 79–97)
Monocytes Absolute: 0.3 x10E3/uL (ref 0.1–0.9)
Monocytes: 6 %
Neutrophils Absolute: 2.4 x10E3/uL (ref 1.4–7.0)
Neutrophils: 45 %
Platelets: 444 x10E3/uL (ref 150–450)
RBC: 4.32 x10E6/uL (ref 3.77–5.28)
RDW: 13.1 % (ref 11.7–15.4)
WBC: 5.3 x10E3/uL (ref 3.4–10.8)

## 2024-08-29 LAB — IGP, APTIMA HPV
HPV Aptima: NEGATIVE
PAP Smear Comment: 0

## 2024-08-30 ENCOUNTER — Encounter: Payer: Self-pay | Admitting: Gastroenterology

## 2024-08-30 ENCOUNTER — Ambulatory Visit: Attending: Cardiology | Admitting: Cardiology

## 2024-08-30 ENCOUNTER — Encounter: Payer: Self-pay | Admitting: Cardiology

## 2024-08-30 VITALS — BP 132/84 | HR 83 | Ht 63.0 in | Wt 181.1 lb

## 2024-08-30 DIAGNOSIS — Z6833 Body mass index (BMI) 33.0-33.9, adult: Secondary | ICD-10-CM

## 2024-08-30 DIAGNOSIS — E6609 Other obesity due to excess calories: Secondary | ICD-10-CM

## 2024-08-30 DIAGNOSIS — I10 Essential (primary) hypertension: Secondary | ICD-10-CM

## 2024-08-30 DIAGNOSIS — K2100 Gastro-esophageal reflux disease with esophagitis, without bleeding: Secondary | ICD-10-CM

## 2024-08-30 DIAGNOSIS — E66811 Obesity, class 1: Secondary | ICD-10-CM | POA: Diagnosis not present

## 2024-08-30 DIAGNOSIS — R079 Chest pain, unspecified: Secondary | ICD-10-CM | POA: Diagnosis not present

## 2024-08-30 NOTE — Patient Instructions (Signed)

## 2024-08-30 NOTE — Progress Notes (Signed)
 Cardiology Office Note:    Date:  08/30/2024   ID:  Janet Richards, DOB Feb 05, 1961, MRN [id number]  PCP:  Booker Darice SAUNDERS, FNP  Cardiologist:  Jennifer SAUNDERS Crape, MD   Referring MD: Media Anes    ASSESSMENT:    1. Chest pain of uncertain etiology   2. Essential hypertension   3. Gastroesophageal reflux disease with esophagitis without hemorrhage   4. Class 1 obesity due to excess calories with serious comorbidity and body mass index (BMI) of 33.0 to 33.9 in adult    PLAN:    In order of problems listed above:  Primary prevention stressed with the patient.  Importance of compliance with diet medication stressed and patient verbalized standing. She was advised to ambulate to the best of her ability. Chest pain: She tells me that she has had complete relief of the symptoms especially after changing her medications to amlodipine  and also taking omeprazole .  She does not want to undergo stress testing at this time.  I respect her wishes. Obesity: Weight reduction stressed diet emphasized and she promises to do better. Patient will be seen in follow-up appointment in 6 months or earlier if the patient has any concerns.    Medication Adjustments/Labs and Tests Ordered: Current medicines are reviewed at length with the patient today.  Concerns regarding medicines are outlined above.  Orders Placed This Encounter  Procedures   EKG 12-Lead   No orders of the defined types were placed in this encounter.    No chief complaint on file.    History of Present Illness:    Janet Richards is a 63 y.o. female.  Patient has past medical history of essential hypertension.  She was evaluated by me for chest pain.  She denies any chest pain at this time.  She did refused evaluation with anything that would go into her body such as radioactive isotope or dye.  She is not a candidate for exercise stress testing because of recent ankle injury that prohibits her from exercising or walking.   At the time of my evaluation, the patient is alert awake oriented and in no distress.  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  Patient instructed by provider to lose weight before tubal reversal.     Last Assessment & Plan:   Formatting of this note might be different from the original.   Obesity is complicated by hyperlipidemia, hypertension and prediabetes.     Continue healthy cardiac diet and exercise.     Contact dermatitis 08/18/2022   DDD (degenerative disc disease), cervical 02/22/2018   Dermatitis due to drugs and medicines taken internally 08/18/2022   Dyspepsia 07/31/2018   Dysuria 09/16/2019   Eczema 08/18/2022   Ptd prescribed elidel.     Encounter for screening for metabolic disorder 01/11/2024   Environmental allergies 09/12/2018   Mold, trees, dust mites, mildew. 2019     Essential hypertension 03/01/2013   Essential hypertension, benign    Exposure to  mold 01/11/2024   Ganglion cyst of right foot 07/12/2016   Generalized headaches 02/18/2020   GERD (gastroesophageal reflux disease) 02/18/2020   History of colonic polyps 03/20/2015   May 2016 diverticulosis single 5mm polyp repeat 03/2020     History of tubal ligation 09/17/2021   Hyperlipidemia 02/20/2015   AHA 10 year risk of 3.5% April 2016  No access to HDL total cholesterol 238 88/7981. Pt would like to continue lifestyle changes and recheck in 6 months to determine statin usage.      Irritation of ear, bilateral 03/09/2020   Lateral epicondylitis of elbow  08/18/2022   Pth has been using rt hand has tenosynovitis radial aspect of hand and also epicondylitis of rt elbow.,  Tendero on compression of epicondyles.  Pt has been using rt side instead of left having had a sprain left wrist at work.     Low back pain 08/18/2022   Mild intermittent asthma without complication 07/31/2018   NAFLD (nonalcoholic fatty liver disease) 90/89/7975   No blood products 03/28/2013   [employer]: Signed papers scanned May 2014 into EMR     Numbness and tingling of both feet 09/02/2016   Osteoarthritis of first metatarsophalangeal joint 04/11/2014   Osteoarthritis of knee 09/17/2021   Other allergic rhinitis 01/14/2015   Overweight 08/18/2022   Patient instructed by provider to lose weight before tubal reversal.     Palpitations 03/09/2020   Periodontitis    Post-menopausal bleeding 05/21/2018   Pre-op evaluation 02/21/2024   Prediabetes 10/17/2014   Preop cardiovascular exam    Primary osteoarthritis of both knees    Sensory neuronopathy 07/29/2018   Type 2 diabetes mellitus without complication, without long-term current use of insulin (HCC) 02/22/2024   Urticaria    Vaccine counseling 04/09/2020   Vaginal dryness, menopausal 03/01/2017    Past Surgical History:  Procedure Laterality Date   TUBAL LIGATION  12/26/1987    Current Medications: Current Meds  Medication Sig   acetaminophen (TYLENOL) 500 MG tablet Take 500 mg by mouth every 6 (six) hours as needed.   amLODipine  (NORVASC ) 5 MG tablet Take 1 tablet (5 mg total) by mouth daily.   cetirizine  (ZYRTEC ) 10 MG tablet Take 1 tablet (10 mg total) by mouth daily.   chlorhexidine  (PERIDEX ) 0.12 % solution Use as directed 15 mLs in the mouth or throat 2 (two) times daily.   Continuous Glucose Sensor (FREESTYLE LIBRE 3 PLUS SENSOR) MISC Change sensor every 15 days.   fluticasone  (FLONASE ) 50 MCG/ACT nasal spray Place 1 spray into both nostrils daily. Begin by using 2 sprays in each nare daily  for 3 to 5 days, then decrease to 1 spray in each nare daily.   Nutritional Supplements (IMMUNOCAL PO) Take 1 tablet by mouth daily.   omeprazole  (PRILOSEC OTC) 20 MG tablet Take 20 mg by mouth daily.   [DISCONTINUED] aspirin  81 MG chewable tablet Chew by mouth daily.   [DISCONTINUED] aspirin  EC 325 MG tablet Take 1 tablet (325 mg total) by mouth daily.     Allergies:   Azithromycin , Clindamycin /lincomycin, Erythromycin, and Rosuvastatin   Social History   Socioeconomic History   Marital status: Married    Spouse name: Not on file   Number of children: 3   Years of education: Not on file   Highest education level: Not on file  Occupational History   Occupation: homemaker  Tobacco Use   Smoking status: Former    Current packs/day: 0.00    Types: Cigarettes  Quit date: 02/05/2006    Years since quitting: 18.5    Passive exposure: Never   Smokeless tobacco: Never  Vaping Use   Vaping status: Never Used  Substance and Sexual Activity   Alcohol use: Not Currently   Drug use: No   Sexual activity: Yes    Partners: Male  Other Topics Concern   Not on file  Social History Narrative   ** Merged History Encounter **       Social Drivers of Health   Financial Resource Strain: Low Risk  (04/18/2023)   Received from [facility]   Overall Financial Resource Strain (CARDIA)    Difficulty of Paying Living Expenses: Not hard at all  Food Insecurity: Low Risk  (07/06/2024)   Received from [facility]   Hunger Vital Sign    Within the past 12 months, you worried that your food would run out before you got money to buy more: Never true    Within the past 12 months, the food you bought just didn't last and you didn't have money to get more. : Never true  Transportation Needs: No Transportation Needs (07/06/2024)   Received from [facility]    In the past 12 months, has lack of reliable transportation kept you from medical appointments, meetings, work or from  getting things needed for daily living? : No  Physical Activity: Unknown (04/18/2023)   Received from [facility]   Exercise Vital Sign    On average, how many days per week do you engage in moderate to strenuous exercise (like a brisk walk)?: 0 days    Minutes of Exercise per Session: Not on file  Stress: No Stress Concern Present (04/18/2023)   Received from [facility] - Occupational Stress Questionnaire    Feeling of Stress : Not at all  Social Connections: Socially Integrated (04/18/2023)   Received from [facility]   Social Network    How would you rate your social network (family, work, friends)?: Good participation with social networks     Family History: The patient's family history includes Allergic rhinitis in her father and son; Arthritis in her mother; Asthma in her son; CAD in her father; Colon cancer in her sister; Hypertension in her mother; Urticaria in her sister. There is no history of Angioedema, Eczema, Immunodeficiency, Esophageal cancer, Stomach cancer, or Rectal cancer.  ROS:   Please see the history of present illness.    All other systems reviewed and are negative.  EKGs/Labs/Other Studies Reviewed:    The following studies were reviewed today: .SABRAEKG Interpretation Date/Time:  Friday August 30 2024 15:27:10 EDT Ventricular Rate:  83 PR Interval:  146 QRS Duration:  82 QT Interval:  354 QTC Calculation: 415 R Axis:   69  Text Interpretation: Normal sinus rhythm Normal ECG When compared with ECG of 05-Mar-2024 11:06, No significant change was found Confirmed by Edwyna Backers [phone]) on 08/30/2024 3:46:53 PM     Recent Labs: 05/08/2024: ALT 23 07/11/2024: BUN 12; Creatinine, Ser 0.50; Potassium 5.1; Sodium 139 08/27/2024: Hemoglobin 12.8; Platelets 444  Recent Lipid Panel    Component Value Date/Time   CHOL 205 (A) 03/17/2022 0000   TRIG 325 (A) 03/17/2022 0000   HDL 48 03/17/2022 0000   CHOLHDL 5  08/01/2019 1117   VLDL 37.0 08/01/2019 1117   LDLCALC 92 03/17/2022 0000    Physical Exam:    VS:  BP 132/84   Pulse 83  Ht 5' 3 (1.6 m)   Wt 181 lb 1.9 oz (82.2 kg)   LMP 09/11/2017 (Approximate)   SpO2 98%   BMI 32.08 kg/m     Wt Readings from Last 3 Encounters:  08/30/24 181 lb 1.9 oz (82.2 kg)  08/27/24 183 lb (83 kg)  07/11/24 184 lb (83.5 kg)     GEN: Patient is in no acute distress HEENT: Normal NECK: No JVD; No carotid bruits LYMPHATICS: No lymphadenopathy CARDIAC: Hear sounds regular, 2/6 systolic murmur at the apex. RESPIRATORY:  Clear to auscultation without rales, wheezing or rhonchi  ABDOMEN: Soft, non-tender, non-distended MUSCULOSKELETAL:  No edema; No deformity  SKIN: Warm and dry NEUROLOGIC:  Alert and oriented x 3 PSYCHIATRIC:  Normal affect   Signed, Jennifer JONELLE Crape, MD  08/30/2024 3:54 PM    [facility]

## 2024-09-02 ENCOUNTER — Ambulatory Visit: Admitting: Family Medicine

## 2024-09-02 NOTE — Telephone Encounter (Deleted)
 Spoke with patient on 08/23/24 regarding patient experience with Stormy CMA. 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 I apologize for the encounter and stated that I would talk to [facility], which I did follow up on 08/28/24 and Stormy admitted to being short. I explained with Sari to [facility] the importance of patient care.

## 2024-09-03 ENCOUNTER — Encounter: Admitting: Orthopedic Surgery

## 2024-09-05 ENCOUNTER — Other Ambulatory Visit (INDEPENDENT_AMBULATORY_CARE_PROVIDER_SITE_OTHER): Payer: Self-pay

## 2024-09-05 ENCOUNTER — Ambulatory Visit: Admitting: Family Medicine

## 2024-09-05 ENCOUNTER — Ambulatory Visit (INDEPENDENT_AMBULATORY_CARE_PROVIDER_SITE_OTHER): Admitting: Orthopedic Surgery

## 2024-09-05 DIAGNOSIS — M25571 Pain in right ankle and joints of right foot: Secondary | ICD-10-CM | POA: Diagnosis not present

## 2024-09-06 ENCOUNTER — Encounter: Payer: Self-pay | Admitting: Orthopedic Surgery

## 2024-09-06 NOTE — Progress Notes (Signed)
 Office Visit Note   Patient: Janet Richards           Date of Birth: September 15, 1961           MRN: [id number] Visit Date: 09/05/2024              Requested by: Booker Darice SAUNDERS, FNP [address] [place],  [place] [address] PCP: Booker Darice SAUNDERS, FNP  Chief Complaint  Patient presents with   Right Ankle - Pain      HPI: Discussed the use of AI scribe software for clinical note transcription with the patient, who gave verbal consent to proceed.  History of Present Illness Zamora Colton Tata Timmins is a 63 year old female who presents with right ankle pain and swelling.  She has been experiencing swelling in her right ankle for about three months, with intermittent pain primarily on the medial aspect of the ankle. The pain is not constant but occurs occasionally, and the swelling is more noticeable compared to her left ankle.  Initial imaging indicated a small fracture, though she does not recall the specific details. She was initially provided with a boot to wear at home to manage the condition.  Her past medical history includes a recent hospitalization for a gastrointestinal bleed.  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.     Assessment & Plan: Visit Diagnoses:  1. Pain in right ankle and joints of right foot     Plan: Assessment and Plan Assessment & Plan Right ankle pain and swelling Chronic right ankle pain and swelling for three months. X-rays showed congruent mortis, no syndesmotic widening or fracture. Fibula was out to length. Tenderness over deltoid ligament, stable anterior drawer test, good range of motion. Ankle stable with no bony abnormalities or ligamentous laxity. Pain intermittent, located medially. - Prescribed physical therapy for ankle strengthening.      Follow-Up Instructions: No follow-ups on file.    Ortho Exam  Patient is alert, oriented, no adenopathy, well-dressed, normal affect, normal respiratory effort. Physical Exam CARDIOVASCULAR: Good pulse. MUSCULOSKELETAL: No tenderness over anterior talofibular ligament. Deltoid ligament tender to palpation. Anterior drawer test stable. Good range of motion of the ankle. Peroneal and posterior tibial tendons not tender. Swelling in right ankle compared to left. NEUROLOGICAL: Normal gait, wearing clogs.      Imaging: No results found. No images are attached to the encounter.  Labs: Lab Results  Component Value Date   HGBA1C 6.2 (H) 06/05/2024   HGBA1C 6.5 (H) 02/21/2024   HGBA1C 6.0 (H) 06/29/2022   LABURIC 6.2 02/24/2016   LABURIC 4.5 12/22/2012   REPTSTATUS 05/09/2024 FINAL 05/08/2024   CULT MULTIPLE SPECIES PRESENT, SUGGEST RECOLLECTION (A) 05/08/2024   LABORGA NO GROWTH 07/01/2017     Lab Results  Component Value Date   ALBUMIN 4.5 05/08/2024   ALBUMIN 4.7 02/21/2024   ALBUMIN 4.1 09/09/2022    Lab Results  Component Value Date   MG 2.0 06/01/2016   Lab Results  Component Value Date   VD25OH 32.70 03/17/2022   VD25OH 26.19 (L) 08/01/2019   VD25OH 9 (L) 12/09/2015    No results found for: PREALBUMIN    Latest Ref Rng & Units 08/27/2024    3:03 PM 05/08/2024    1:24 PM 02/21/2024    2:26 PM  CBC EXTENDED  WBC 3.4 - 10.8 x10E3/uL  5.3  6.8  6.3   RBC 3.77 - 5.28 x10E6/uL 4.32  4.47  4.50   Hemoglobin 11.1 - 15.9 g/dL 87.1  86.6  86.3   HCT 34.0 - 46.6 % 39.8  41.6  40.9   Platelets 150 - 450 x10E3/uL 444  419  385   NEUT# 1.4 - 7.0 x10E3/uL 2.4  3.8  2.6   Lymph# 0.7 - 3.1 x10E3/uL 2.4  2.6  3.0      There is no height or weight on file to calculate BMI.  Orders:  Orders Placed This Encounter  Procedures   XR Ankle 2 Views Right   No orders of the defined types were placed in this encounter.    Procedures: No procedures performed  Clinical Data: No additional findings.  ROS:  All  other systems negative, except as noted in the HPI. Review of Systems  Objective: Vital Signs: LMP 09/11/2017 (Approximate)   Specialty Comments:  No specialty comments available.  PMFS History: Patient Active Problem List   Diagnosis Date Noted   History of GI bleed 08/27/2024   Colitis 08/27/2024   History of fracture of right ankle 08/27/2024   Hospital discharge follow-up 07/11/2024   Hyponatremia 07/11/2024   Breast anomaly 07/02/2024   Chronic cough 07/02/2024   Urgency of urination 06/05/2024   Yellow nails 03/06/2024   Allergy    Elevated blood pressure reading in office with diagnosis of hypertension    Urticaria    Screening for cervical cancer    Type 2 diabetes mellitus without complication, without long-term current use of insulin (HCC) 02/22/2024   Pre-op evaluation 02/21/2024   Abnormal EKG 02/21/2024   Acute non-recurrent maxillary sinusitis 01/30/2024   Encounter for screening for metabolic disorder 01/11/2024   Exposure to mold 01/11/2024   NAFLD (nonalcoholic fatty liver disease) 90/89/7975   Asthma 08/18/2022   Contact dermatitis 08/18/2022   Dermatitis due to drugs and medicines taken internally 08/18/2022   Eczema 08/18/2022   Lateral epicondylitis of elbow 08/18/2022   Low back pain 08/18/2022   Overweight 08/18/2022   History of tubal ligation 09/17/2021   Osteoarthritis of knee 09/17/2021   Vaccine counseling 04/09/2020   Palpitations 03/09/2020   Irritation of ear, bilateral 03/09/2020   Generalized headaches 02/18/2020   GERD (gastroesophageal reflux disease) 02/18/2020   Class 1 obesity due to excess calories with serious comorbidity and body mass index (BMI) of 33.0 to 33.9 in adult 01/19/2020   Dysuria 09/16/2019   Chronic pancreatitis (HCC) 04/10/2019   Environmental allergies 09/12/2018   Mild intermittent asthma without complication 07/31/2018   Dyspepsia 07/31/2018   Sensory neuronopathy 07/29/2018   Post-menopausal bleeding  05/21/2018   DDD (degenerative disc disease), cervical 02/22/2018   Periodontitis 12/15/2017   Vaginal dryness, menopausal 03/01/2017   Abnormal ankle brachial index (ABI) 11/09/2016   Numbness and tingling of both feet 09/02/2016   Ganglion cyst of right foot 07/12/2016   Hypokalemia 05/24/2016   Chronic pain of right ankle 02/24/2016   History of colonic polyps 03/20/2015   Hyperlipidemia 02/20/2015   Other allergic rhinitis 01/14/2015   Prediabetes 10/17/2014   Osteoarthritis of first metatarsophalangeal joint 04/11/2014   Chronic pain of left knee 02/25/2014   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/23/2013   No blood products 03/28/2013   Essential hypertension 03/01/2013   Primary osteoarthritis of both knees 12/27/2012   Past Medical History:  Diagnosis Date   Abnormal ankle brachial index (ABI) 11/09/2016   Right leg  Abnormal EKG 02/21/2024   Acute non-recurrent maxillary sinusitis 01/30/2024   Allergy    Asthma    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  Patient instructed by provider to lose weight before tubal reversal.     Last Assessment & Plan:   Formatting of this note might be different from the original.   Obesity is complicated by hyperlipidemia, hypertension and prediabetes.     Continue healthy cardiac diet and exercise.     Contact dermatitis 08/18/2022   DDD (degenerative disc disease), cervical 02/22/2018   Dermatitis due to drugs and medicines taken internally 08/18/2022   Dyspepsia 07/31/2018   Dysuria 09/16/2019   Eczema 08/18/2022   Ptd prescribed elidel.     Encounter for screening for metabolic disorder 01/11/2024   Environmental allergies 09/12/2018   Mold, trees, dust mites, mildew. 2019     Essential  hypertension 03/01/2013   Essential hypertension, benign    Exposure to mold 01/11/2024   Ganglion cyst of right foot 07/12/2016   Generalized headaches 02/18/2020   GERD (gastroesophageal reflux disease) 02/18/2020   History of colonic polyps 03/20/2015   May 2016 diverticulosis single 5mm polyp repeat 03/2020     History of tubal ligation 09/17/2021   Hyperlipidemia 02/20/2015   AHA 10 year risk of 3.5% April 2016  No access to HDL total cholesterol 238 88/7981. Pt would like to continue lifestyle changes and recheck in 6 months to determine statin usage.      Irritation of ear, bilateral 03/09/2020   Lateral epicondylitis of elbow 08/18/2022   Pth has been using rt hand has tenosynovitis radial aspect of hand and also epicondylitis of rt elbow.,  Tendero on compression of epicondyles.  Pt has been using rt side instead of left having had a sprain left wrist at work.     Low back pain 08/18/2022   Mild intermittent asthma without complication 07/31/2018   NAFLD (nonalcoholic fatty liver disease) 90/89/7975   No blood products 03/28/2013   J[employer]: Signed papers scanned May 2014 into EMR     Numbness and tingling of both feet 09/02/2016   Osteoarthritis of first metatarsophalangeal joint 04/11/2014   Osteoarthritis of knee 09/17/2021   Other allergic rhinitis 01/14/2015   Overweight 08/18/2022   Patient instructed by provider to lose weight before tubal reversal.     Palpitations 03/09/2020   Periodontitis    Post-menopausal bleeding 05/21/2018   Pre-op evaluation 02/21/2024   Prediabetes 10/17/2014   Preop cardiovascular exam    Primary osteoarthritis of both knees    Sensory neuronopathy 07/29/2018   Type 2 diabetes mellitus without complication, without long-term current use of insulin (HCC) 02/22/2024   Urticaria    Vaccine counseling 04/09/2020   Vaginal dryness, menopausal 03/01/2017    Family History  Problem Relation Age of Onset   Arthritis Mother     Hypertension Mother    CAD Father        MI in his 43s   Allergic rhinitis Father    Urticaria Sister    Colon cancer Sister    Allergic rhinitis Son    Asthma Son    Angioedema Neg Hx    Eczema Neg Hx    Immunodeficiency Neg  Hx    Esophageal cancer Neg Hx    Stomach cancer Neg Hx    Rectal cancer Neg Hx     Past Surgical History:  Procedure Laterality Date   TUBAL LIGATION  12/26/1987   Social History   Occupational History   Occupation: homemaker  Tobacco Use   Smoking status: Former    Current packs/day: 0.00    Types: Cigarettes    Quit date: 02/05/2006    Years since quitting: 18.5    Passive exposure: Never   Smokeless tobacco: Never  Vaping Use   Vaping status: Never Used  Substance and Sexual Activity   Alcohol use: Not Currently   Drug use: No   Sexual activity: Yes    Partners: Male

## 2024-09-18 ENCOUNTER — Ambulatory Visit: Admitting: Cardiology

## 2024-09-19 ENCOUNTER — Ambulatory Visit: Admitting: Family Medicine

## 2024-09-20 NOTE — Progress Notes (Signed)
 STEPH CHEADLE                                          MRN: [id number]   09/20/2024   The [facility] Quality Team Specialist reviewed this patient medical record for the purposes of chart review for care gap closure. The following were reviewed: abstraction for care gap closure-controlling blood pressure.    [facility] Quality Team

## 2024-10-02 ENCOUNTER — Ambulatory Visit: Admitting: Cardiology

## 2024-10-08 ENCOUNTER — Ambulatory Visit: Admitting: Family Medicine

## 2024-10-09 DIAGNOSIS — J301 Allergic rhinitis due to pollen: Secondary | ICD-10-CM | POA: Diagnosis not present

## 2024-10-09 DIAGNOSIS — R0602 Shortness of breath: Secondary | ICD-10-CM | POA: Diagnosis not present

## 2024-10-09 DIAGNOSIS — J3089 Other allergic rhinitis: Secondary | ICD-10-CM | POA: Diagnosis not present

## 2024-10-20 ENCOUNTER — Other Ambulatory Visit: Payer: Self-pay

## 2024-10-20 ENCOUNTER — Ambulatory Visit
Admission: EM | Admit: 2024-10-20 | Discharge: 2024-10-20 | Disposition: A | Discharge status: Home / Self Care | Attending: Internal Medicine | Admitting: Internal Medicine

## 2024-10-20 DIAGNOSIS — R1013 Epigastric pain: Secondary | ICD-10-CM | POA: Diagnosis not present

## 2024-10-20 DIAGNOSIS — R3121 Asymptomatic microscopic hematuria: Secondary | ICD-10-CM

## 2024-10-20 LAB — POCT URINE DIPSTICK
Bilirubin, UA: NEGATIVE
Glucose, UA: NEGATIVE mg/dL
Ketones, POC UA: NEGATIVE mg/dL
Leukocytes, UA: NEGATIVE
Nitrite, UA: NEGATIVE
Spec Grav, UA: 1.02 (ref 1.010–1.025)
Urobilinogen, UA: 0.2 U/dL
pH, UA: 6 (ref 5.0–8.0)

## 2024-10-20 MED ORDER — LIDOCAINE VISCOUS HCL 2 % MT SOLN
15.0000 mL | Freq: Once | OROMUCOSAL | Status: AC
  Administered 2024-10-20: 15 mL via OROMUCOSAL

## 2024-10-20 MED ORDER — OMEPRAZOLE 20 MG PO CPDR: 20.0000 mg | DELAYED_RELEASE_CAPSULE | Freq: Every day | ORAL | 2 refills | Status: AC

## 2024-10-20 MED ORDER — ALUM & MAG HYDROXIDE-SIMETH 200-200-20 MG/5ML PO SUSP
30.0000 mL | Freq: Once | ORAL | Status: AC
  Administered 2024-10-20: 30 mL via ORAL

## 2024-10-20 NOTE — ED Triage Notes (Signed)
 C/O generalized abdominal pain since last night. Denies vomiting and diarrhea. Patient states severe pain last night but it improved after tums last night. C/O vaginal itching. Denies urinary symptoms.

## 2024-10-20 NOTE — Discharge Instructions (Signed)
 Please noted our office is limited in the tests and imaging we can perform at our facility. Your evaluation was not suggestive of any emergent condition requiring medical intervention at this time. However, some abdominal problems make take more time to appear. Therefore, it is very important for you to pay attention to any new symptoms or worsening of your current condition.   Please go directly to the Emergency Department immediately should you begin to feel worse in any way or have any of the following symptoms: increasing or different abdominal pain, persistent vomiting, inability to drink fluids, fevers, bloody bowel movements, or begin vomiting blood.    I have refilled you omeprazole  today. Please take as directed.   I also recommend you follow-up with your PCP for recheck.  Your blood work and urine was sent to the lab for further testing. Someone from our office will call you with your results.

## 2024-10-20 NOTE — ED Provider Notes (Signed)
 [facility]  Note:  This document was prepared using Dragon voice recognition software and may include unintentional dictation errors.  MRN: [id number] DOB: 1961-05-02 DATE: 10/20/2024   Subjective:  Chief Complaint:  Chief Complaint  Patient presents with   Abdominal Pain    HPI: Janet Richards is a 63 y.o. female presenting for epigastric pain for one.  Patient states last night around 11:30 PM she started with epigastric pain.  She states the pain got progressively worse.  Patient did have radiation into her back at that time.  She states her husband went and got her Tums and she took several.  She reports relief after taking the Tums.  She reports some discomfort today, but not to the degree as it was last night.  She states she was previously on Prilosec for GERD, but states she ran out of her prescription yesterday.  She states she did have a chicken patty and chips to eat last night.  She reports no nausea/vomiting or diarrhea.  Of note, patient was diagnosed with colitis in October.  Imaging at that time was unremarkable except for the short segment colitis.  H. pylori was negative that time as well.  Patient reports her only abdominal surgery was a tubal ligation. Denies fever, nausea/vomiting, diarrhea, bloody stools, dysuria. Endorses epigastric pain. Presents NAD.  Prior to Admission medications  Medication Sig Start Date End Date Taking? Authorizing Provider  acetaminophen (TYLENOL) 500 MG tablet Take 500 mg by mouth every 6 (six) hours as needed.    [provider]  amLODipine  (NORVASC ) 5 MG tablet Take 1 tablet (5 mg total) by mouth daily. 08/27/24 11/25/24  Booker Darice SAUNDERS, FNP  cetirizine  (ZYRTEC ) 10 MG tablet Take 1 tablet (10 mg total) by mouth daily. 07/16/24 10/14/24  Joesph Shaver Scales, PA-C  chlorhexidine  (PERIDEX ) 0.12 % solution Use as directed 15 mLs in the mouth or throat 2 (two) times daily. 02/27/24   Booker Darice SAUNDERS, FNP  Continuous Glucose Sensor  (FREESTYLE LIBRE 3 PLUS SENSOR) MISC Change sensor every 15 days. 07/24/24   Booker Darice SAUNDERS, FNP  Nutritional Supplements (IMMUNOCAL PO) Take 1 tablet by mouth daily.    [provider]  omeprazole  (PRILOSEC OTC) 20 MG tablet Take 20 mg by mouth daily.    [provider]     Allergies[1]  History:   Past Medical History:  Diagnosis Date   Abnormal ankle brachial index (ABI) 11/09/2016   Right leg      Abnormal EKG 02/21/2024   Acute non-recurrent maxillary sinusitis 01/30/2024   Allergy    Asthma    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  Patient instructed by provider to lose weight before tubal reversal.     Last Assessment & Plan:   Formatting of this note might be different from the original.   Obesity is complicated by hyperlipidemia, hypertension and prediabetes.     Continue healthy cardiac diet and exercise.     Contact dermatitis 08/18/2022   DDD (degenerative disc disease), cervical 02/22/2018   Dermatitis due to drugs and medicines taken internally 08/18/2022   Dyspepsia 07/31/2018   Dysuria 09/16/2019   Eczema 08/18/2022   Ptd prescribed elidel.  Encounter for screening for metabolic disorder 01/11/2024   Environmental allergies 09/12/2018   Mold, trees, dust mites, mildew. 2019     Essential hypertension 03/01/2013   Essential hypertension, benign    Exposure to mold 01/11/2024   Ganglion cyst of right foot 07/12/2016   Generalized headaches 02/18/2020   GERD (gastroesophageal reflux disease) 02/18/2020   History of colonic polyps 03/20/2015   May 2016 diverticulosis single 5mm polyp repeat 03/2020     History of tubal ligation 09/17/2021   Hyperlipidemia 02/20/2015   AHA 10 year risk of  3.5% April 2016  No access to HDL total cholesterol 238 88/7981. Pt would like to continue lifestyle changes and recheck in 6 months to determine statin usage.      Irritation of ear, bilateral 03/09/2020   Lateral epicondylitis of elbow 08/18/2022   Pth has been using rt hand has tenosynovitis radial aspect of hand and also epicondylitis of rt elbow.,  Tendero on compression of epicondyles.  Pt has been using rt side instead of left having had a sprain left wrist at work.     Low back pain 08/18/2022   Mild intermittent asthma without complication 07/31/2018   NAFLD (nonalcoholic fatty liver disease) 90/89/7975   No blood products 03/28/2013   J[employer] Witness: Signed papers scanned May 2014 into EMR     Numbness and tingling of both feet 09/02/2016   Osteoarthritis of first metatarsophalangeal joint 04/11/2014   Osteoarthritis of knee 09/17/2021   Other allergic rhinitis 01/14/2015   Overweight 08/18/2022   Patient instructed by provider to lose weight before tubal reversal.     Palpitations 03/09/2020   Periodontitis    Post-menopausal bleeding 05/21/2018   Pre-op evaluation 02/21/2024   Prediabetes 10/17/2014   Preop cardiovascular exam    Primary osteoarthritis of both knees    Sensory neuronopathy 07/29/2018   Type 2 diabetes mellitus without complication, without long-term current use of insulin (HCC) 02/22/2024   Urticaria    Vaccine counseling 04/09/2020   Vaginal dryness, menopausal 03/01/2017     Past Surgical History:  Procedure Laterality Date   TUBAL LIGATION  12/26/1987    Family History  Problem Relation Age of Onset   Arthritis Mother    Hypertension Mother    CAD Father        MI in his 57s   Allergic rhinitis Father    Urticaria Sister    Colon cancer Sister    Allergic rhinitis Son    Asthma Son    Angioedema Neg Hx    Eczema Neg Hx    Immunodeficiency Neg Hx    Esophageal cancer Neg Hx    Stomach cancer Neg Hx    Rectal cancer Neg Hx      Social History[2]  Review of Systems  Constitutional:  Negative for fever.  Gastrointestinal:  Positive for abdominal pain. Negative for blood in stool, diarrhea, nausea and vomiting.  Genitourinary:  Negative for dysuria and flank pain.  Musculoskeletal:  Positive for back pain.     Objective:   Vitals: BP 119/87 (BP Location: Right Arm)   Pulse 86   Temp 98.1 F (36.7 C) (Oral)   Resp 18   LMP 09/11/2017   SpO2 97%   Physical Exam Constitutional:      General: She is not in acute distress.    Appearance: Normal appearance. She is well-developed. She is obese. She is not ill-appearing or toxic-appearing.  HENT:     Head: Normocephalic and atraumatic.  Cardiovascular:     Rate and Rhythm: Normal rate and regular rhythm.     Heart sounds: Normal heart sounds.  Pulmonary:     Effort: Pulmonary effort is normal.     Breath sounds: Normal breath sounds.     Comments: Clear to auscultation bilaterally  Abdominal:     General: Bowel sounds are normal.     Palpations: Abdomen is soft.     Tenderness: There is no abdominal tenderness. There is no right CVA tenderness or left CVA tenderness.     Comments: No acute abdomen   Skin:    General: Skin is warm and dry.  Neurological:     General: No focal deficit present.     Mental Status: She is alert.  Psychiatric:        Mood and Affect: Mood and affect normal.     Results:  Labs: Results for orders placed or performed during the hospital encounter of 10/20/24 (from the past 24 hours)  POCT URINE DIPSTICK     Status: Abnormal   Collection Time: 10/20/24 11:29 AM  Result Value Ref Range   Color, UA yellow yellow   Clarity, UA clear clear   Glucose, UA negative negative mg/dL   Bilirubin, UA negative negative   Ketones, POC UA negative negative mg/dL   Spec Grav, UA 8.979 8.989 - 1.025   Blood, UA trace-intact (A) negative   pH, UA 6.0 5.0 - 8.0   POC PROTEIN,UA trace negative, trace   Urobilinogen, UA 0.2  0.2 or 1.0 E.U./dL   Nitrite, UA Negative Negative   Leukocytes, UA Negative Negative    Radiology: No results found.   UC Course/Treatments:  Procedures: Procedures   Medications Ordered in UC: Medications  alum & mag hydroxide-simeth (MAALOX/MYLANTA) 200-200-20 MG/5ML suspension 30 mL (has no administration in time range)  lidocaine  (XYLOCAINE ) 2 % viscous mouth solution 15 mL (has no administration in time range)     Assessment and Plan :     ICD-10-CM   1. Epigastric pain  R10.13 POCT URINE DIPSTICK    POCT URINE DIPSTICK    Comprehensive metabolic panel    CBC with Differential/Platelet    Lipase    Comprehensive metabolic panel    CBC with Differential/Platelet    Lipase    2. Asymptomatic microscopic hematuria  R31.21 Urine Culture    Urine Culture      Epigastric pain Asymptomatic microscopic hematuria Afebrile, nontoxic-appearing, NAD. VSS. DDX includes but not limited to: gastroenteritis, gastritis, appendicitis, cholecystitis, pancreatitis, viral etiology, CHS Patient reports symptoms are improving.  No acute abdomen noted on exam.  Nontender to palpation.  Patient does have a history of GERD and states she will ran out of her omeprazole  yesterday.  UA was positive for trace blood.  Urine culture pending.  Low suspicion for urologic etiology given no dysuria or CVA tenderness at this time.  GI cocktail was administered due to suspected GERD component.  Patient reported improvement.  CBC, CMP, and lipase are pending to evaluate WBC, liver function, and for pancreatitis.  Her omeprazole  20 mg daily was refilled.  Recommend follow-up with her PCP and/or GI doctor if symptoms persist. Strict ED precautions were given and patient verbalized understanding.   ED Discharge Orders          Ordered    omeprazole  (PRILOSEC) 20 MG capsule  Daily        10/20/24 1158  PDMP not reviewed this encounter.      [1]  Allergies Allergen Reactions    Azithromycin  Nausea And Vomiting   Clindamycin /Lincomycin Hives    Facial swelling   Erythromycin Hives and Nausea And Vomiting    Has taken azithromycin  with no issues in the past.   Rosuvastatin     Other Reaction(s): Myalgia  [2]  Social History Tobacco Use   Smoking status: Former    Current packs/day: 0.00    Average packs/day: 1.5 packs/day    Types: Cigarettes    Quit date: 02/05/2006    Years since quitting: 18.7    Passive exposure: Never   Smokeless tobacco: Never  Vaping Use   Vaping status: Never Used  Substance Use Topics   Alcohol use: Not Currently   Drug use: No     Courtney Fenlon P, PA-C 10/20/24 1223

## 2024-10-20 NOTE — ED Notes (Signed)
 Patient reports good relief with GI cocktail

## 2024-10-21 ENCOUNTER — Ambulatory Visit

## 2024-10-21 ENCOUNTER — Ambulatory Visit (HOSPITAL_COMMUNITY): Payer: Self-pay

## 2024-10-21 ENCOUNTER — Encounter: Payer: Self-pay | Admitting: Family Medicine

## 2024-10-21 ENCOUNTER — Telehealth: Payer: Self-pay

## 2024-10-21 ENCOUNTER — Ambulatory Visit: Admitting: Family Medicine

## 2024-10-21 VITALS — BP 130/80 | HR 83 | Ht 63.0 in | Wt 182.4 lb

## 2024-10-21 DIAGNOSIS — M25512 Pain in left shoulder: Secondary | ICD-10-CM

## 2024-10-21 DIAGNOSIS — R1084 Generalized abdominal pain: Secondary | ICD-10-CM | POA: Diagnosis not present

## 2024-10-21 DIAGNOSIS — E119 Type 2 diabetes mellitus without complications: Secondary | ICD-10-CM

## 2024-10-21 DIAGNOSIS — K0530 Chronic periodontitis, unspecified: Secondary | ICD-10-CM | POA: Diagnosis not present

## 2024-10-21 DIAGNOSIS — I10 Essential (primary) hypertension: Secondary | ICD-10-CM

## 2024-10-21 LAB — CBC WITH DIFFERENTIAL/PLATELET
Basophils Absolute: 0 x10E3/uL (ref 0.0–0.2)
Basos: 0 %
EOS (ABSOLUTE): 0.1 x10E3/uL (ref 0.0–0.4)
Eos: 3 %
Hematocrit: 39.7 % (ref 34.0–46.6)
Hemoglobin: 12.6 g/dL (ref 11.1–15.9)
Immature Grans (Abs): 0 x10E3/uL (ref 0.0–0.1)
Immature Granulocytes: 0 %
Lymphocytes Absolute: 2.1 x10E3/uL (ref 0.7–3.1)
Lymphs: 38 %
MCH: 28.9 pg (ref 26.6–33.0)
MCHC: 31.7 g/dL (ref 31.5–35.7)
MCV: 91 fL (ref 79–97)
Monocytes Absolute: 0.3 x10E3/uL (ref 0.1–0.9)
Monocytes: 6 %
Neutrophils Absolute: 2.9 x10E3/uL (ref 1.4–7.0)
Neutrophils: 52 %
Platelets: 385 x10E3/uL (ref 150–450)
RBC: 4.36 x10E6/uL (ref 3.77–5.28)
RDW: 13.2 % (ref 11.7–15.4)
WBC: 5.5 x10E3/uL (ref 3.4–10.8)

## 2024-10-21 LAB — LIPASE: Lipase: 22 U/L (ref 14–72)

## 2024-10-21 LAB — COMPREHENSIVE METABOLIC PANEL WITH GFR
ALT: 21 IU/L (ref 0–32)
AST: 13 IU/L (ref 0–40)
Albumin: 4.3 g/dL (ref 3.9–4.9)
Alkaline Phosphatase: 100 IU/L (ref 49–135)
BUN/Creatinine Ratio: 15 (ref 12–28)
BUN: 14 mg/dL (ref 8–27)
Bilirubin Total: 0.4 mg/dL (ref 0.0–1.2)
CO2: 25 mmol/L (ref 20–29)
Calcium: 10.6 mg/dL — ABNORMAL HIGH (ref 8.7–10.3)
Chloride: 100 mmol/L (ref 96–106)
Creatinine, Ser: 0.94 mg/dL (ref 0.57–1.00)
Globulin, Total: 2.5 g/dL (ref 1.5–4.5)
Glucose: 104 mg/dL — ABNORMAL HIGH (ref 70–99)
Potassium: 4.1 mmol/L (ref 3.5–5.2)
Sodium: 140 mmol/L (ref 134–144)
Total Protein: 6.8 g/dL (ref 6.0–8.5)
eGFR: 69 mL/min/1.73 (ref >59)

## 2024-10-21 MED ORDER — CHLORHEXIDINE GLUCONATE 0.12 % MT SOLN: 15.0000 mL | Freq: Two times a day (BID) | OROMUCOSAL | 1 refills | Status: AC

## 2024-10-21 NOTE — Assessment & Plan Note (Signed)
 Refill sent Peridex . Struggling to find dentist that takes her insurance.

## 2024-10-21 NOTE — Assessment & Plan Note (Addendum)
 Epigastic pain, went to urgent care on 10/20/24 Lipase normal.  CBC normal. Omeprazole  20 mg refilled which improved her symptoms, however, she continues to have some generalized abdominal tenderness. Abdominal ultrasound ordered for evaluation. Follow-up based on results.

## 2024-10-21 NOTE — Assessment & Plan Note (Signed)
 No injury. ROM intact. Posterior tenderness with palpation. Xray  left shoulder. Follow-up based on results.

## 2024-10-21 NOTE — Telephone Encounter (Signed)
 Patient was called verified their identity with two patient identifiers. The patient states they would be picking up their Prilosec today and that her abdominal pain is still present. She was informed her labs were essentially normal per Kyra Hoop PA-C although her Glucose and Calcium were slightly elevated. Patient was informed she could discuss the labs and symptoms with her PCP today and she verbalized understanding.

## 2024-10-21 NOTE — Progress Notes (Signed)
 Established Patient Office Visit  Subjective   Patient ID: Janet Richards, female    DOB: 1961-06-12  Age: 63 y.o. MRN: [id number]  Chief Complaint  Patient presents with   Medical Management of Chronic Issues    DM  URGENT CARE - yesterday stomach issues- lab work was done would like to go over it - maybe an ultrasound needed? (Per pt) Slight pain in shoulder  Ankle - small hairline fracture - hardly visible    HTN: amlodipine  5 mg daily Denies chest pain and shortness of breath. Home readings: 119/79, 19/87 at urgent care yesterday.  12/15: CMP: GFR: 69, K+ 4.1 Well controlled in office.   DM: Not currently taking medications for DM, treating naturally.  A1C : 06/05/24: 6.2, down from 6.5 A1C today  Periodontal disease:  Peridex  15 mls BID. Unable to find dentist. Refill sent.  Went to urgent care yesterday. Epigastric pain. TUMS helped some, did not resolve. GI cocktail resolved her symptoms , has returned some today.  Had ran out of omeprazole  20 mg daily.   Refill sent. Endorses upper abdominal pain.  Will order abdominal  ultrasound to evaluate generalized abdominal pain.   Shoulder pain: Left shoulder with stabbing pain. Present for one week. Pain upon palpation posterior shoulder.  ROM intact. X-ray of  left shoulder for evaluation. Follow-up based on results.    Seeing ortho for right ankle. Continue to follow.            ROS    Objective:     BP 130/80 (Patient Position: Sitting, Cuff Size: Large)   Pulse 83   Ht 5' 3 (1.6 m)   Wt 182 lb 6.4 oz (82.7 kg)   LMP 09/11/2017   SpO2 97%   BMI 32.31 kg/m    Physical Exam Vitals and nursing note reviewed.  Constitutional:      General: She is not in acute distress.    Appearance: Normal appearance.  Cardiovascular:     Rate and Rhythm: Normal rate and regular rhythm.     Heart sounds: Normal heart sounds.  Pulmonary:     Effort: Pulmonary effort is normal.     Breath  sounds: Normal breath sounds.  Abdominal:     Tenderness: There is abdominal tenderness.     Comments: Generalized.   Musculoskeletal:     Left shoulder: Tenderness present. No swelling or deformity. Normal range of motion.     Comments: 5/5 grip strength. Posterior tenderness upon palpation.   Skin:    General: Skin is warm and dry.  Neurological:     General: No focal deficit present.     Mental Status: She is alert. Mental status is at baseline.  Psychiatric:        Mood and Affect: Mood normal.        Behavior: Behavior normal.        Thought Content: Thought content normal.        Judgment: Judgment normal.      No results found for any visits on 10/21/24.    The 10-year ASCVD risk score (Arnett DK, et al., 2019) is: 11.2%    Assessment & Plan:   Problem List Items Addressed This Visit     Essential hypertension - Primary (Chronic)   Taking amlodipine  5 mg daily Denies chest pain and shortness of breath. Home readings: 119/79, 19/87 at urgent care yesterday.  Well controlled in office.  12/15: CMP: GFR: 69, K+ 4.1 Does not need refills  today. Follow-up in 6 months, sooner if anything changes.         Periodontitis   Refill sent Peridex . Struggling to find dentist that takes her insurance.       Relevant Medications   chlorhexidine  (PERIDEX ) 0.12 % solution   Type 2 diabetes mellitus without complication, without long-term current use of insulin (HCC)   Treating her diabetes naturally. Had a decrease in A1C in July from 6.5, to 6.2 A1C today.         Relevant Orders   Hemoglobin A1c   Acute pain of left shoulder   No injury. ROM intact. Posterior tenderness with palpation. Xray  left shoulder. Follow-up based on results.       Relevant Orders   DG Shoulder Left   Generalized abdominal pain   Epigastic pain, went to urgent care on 10/20/24 Lipase normal.  CBC normal. Omeprazole  20 mg refilled which improved her symptoms, however, she  continues to have some generalized abdominal tenderness. Abdominal ultrasound ordered for evaluation. Follow-up based on results.       Relevant Orders   US  Abdomen Complete  Agrees with plan of care discussed.  Questions answered.   Return if symptoms worsen or fail to improve.    Darice JONELLE Brownie, FNP

## 2024-10-21 NOTE — Assessment & Plan Note (Signed)
 Taking amlodipine  5 mg daily Denies chest pain and shortness of breath. Home readings: 119/79, 19/87 at urgent care yesterday.  Well controlled in office.  12/15: CMP: GFR: 69, K+ 4.1 Does not need refills today. Follow-up in 6 months, sooner if anything changes.

## 2024-10-21 NOTE — Assessment & Plan Note (Signed)
 Treating her diabetes naturally. Had a decrease in A1C in July from 6.5, to 6.2 A1C today.

## 2024-10-22 ENCOUNTER — Encounter: Payer: Self-pay | Admitting: Gastroenterology

## 2024-10-22 ENCOUNTER — Ambulatory Visit: Payer: Self-pay | Admitting: Family Medicine

## 2024-10-22 ENCOUNTER — Ambulatory Visit: Admitting: Gastroenterology

## 2024-10-22 VITALS — BP 130/80 | HR 86 | Ht 63.0 in | Wt 182.0 lb

## 2024-10-22 DIAGNOSIS — R109 Unspecified abdominal pain: Secondary | ICD-10-CM

## 2024-10-22 DIAGNOSIS — R935 Abnormal findings on diagnostic imaging of other abdominal regions, including retroperitoneum: Secondary | ICD-10-CM | POA: Diagnosis not present

## 2024-10-22 DIAGNOSIS — K760 Fatty (change of) liver, not elsewhere classified: Secondary | ICD-10-CM

## 2024-10-22 DIAGNOSIS — K625 Hemorrhage of anus and rectum: Secondary | ICD-10-CM

## 2024-10-22 DIAGNOSIS — R194 Change in bowel habit: Secondary | ICD-10-CM

## 2024-10-22 DIAGNOSIS — K219 Gastro-esophageal reflux disease without esophagitis: Secondary | ICD-10-CM

## 2024-10-22 DIAGNOSIS — K529 Noninfective gastroenteritis and colitis, unspecified: Secondary | ICD-10-CM | POA: Diagnosis not present

## 2024-10-22 DIAGNOSIS — R1013 Epigastric pain: Secondary | ICD-10-CM | POA: Diagnosis not present

## 2024-10-22 LAB — URINE CULTURE: Culture: NO GROWTH

## 2024-10-22 LAB — HEMOGLOBIN A1C
Est. average glucose Bld gHb Est-mCnc: 128 mg/dL
Hgb A1c MFr Bld: 6.1 % — ABNORMAL HIGH (ref 4.8–5.6)

## 2024-10-22 MED ORDER — NA SULFATE-K SULFATE-MG SULF 17.5-3.13-1.6 GM/177ML PO SOLN: 1.0000 | Freq: Once | ORAL | 0 refills | Status: AC

## 2024-10-22 MED ORDER — HYOSCYAMINE SULFATE 0.125 MG SL SUBL: 0.1250 mg | SUBLINGUAL_TABLET | Freq: Two times a day (BID) | SUBLINGUAL | 0 refills | Status: AC | PRN

## 2024-10-22 NOTE — Progress Notes (Signed)
 Chief Complaint:History of GI bleed, colitis Primary GI Doctor:Dr. San  HPI:  Patient is a  63  year old female patient with past medical history of HTN, GERD, and DM, who was referred to me by Booker Darice SAUNDERS, FNP on 08/27/24 for a evaluation of colitis, history of colitis.    08/12/2024 patient seen in [facility] ED presenting with 2 days of bright red blood per rectum and abdominal pain. In the ED:Hbg 13.6 to 12.9.  CT scan suggestive of short segment colitis of splenic flexure.  10/20/2024 patient seen in ED for epigastric pain. Ran out of her PPI therapy day before. Labs show: normal lipase, normal CBC, Calcium 10.6, other wise normal CMP  Interval History Patient was seen in the GI office on 04/18/2019 by Dr. San for elevated liver enzymes, elevated lipase and right upper quadrant ultrasound demonstrating steatosis.   Patient presents for evaluation of generalized abdominal pain.  Accompanied by her son and husband.  Patient reports she has a bout of colitis in October where she had painless rectal bleeding and diarrhea.  Patient reports no known triggers.  No recent travel or exposure. No viral illness.  Patient reports she has not had any bleeding or diarrhea since October.   Patient recently had a episode of generalized abdominal pain that led her to the ED.  She denies any known triggers.  No food triggers.  Does not relate the pain to having a BM.  She denies diarrhea or constipation.  She reports she had run out of her antiacid the day before she went to the ED.  They gave her a GI cocktail which helped. Patient is currently taking omeprazole  20 mg p.o. daily for history of GERD but has continued with intermittent abdominal discomfort.  Patient denies any nausea or vomiting.  Patient denies dysphagia.  Former smoker, stopped 20 years ago. Socially drank, stopped in 2022  No blood thinners.   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  Patient instructed by provider to lose weight before tubal reversal.     Last Assessment & Plan:   Formatting of this note might be different from the original.   Obesity is complicated by hyperlipidemia, hypertension and prediabetes.     Continue healthy cardiac diet and exercise.     Contact dermatitis 08/18/2022   DDD (degenerative disc disease), cervical 02/22/2018   Dermatitis due to drugs and medicines taken internally 08/18/2022   Dyspepsia 07/31/2018   Dysuria 09/16/2019   Eczema 08/18/2022   Ptd prescribed elidel.     Encounter for screening for metabolic disorder 01/11/2024   Environmental allergies 09/12/2018   Mold, trees, dust mites, mildew. 2019  Essential hypertension 03/01/2013   Essential hypertension, benign    Exposure to mold 01/11/2024   Ganglion cyst of right foot 07/12/2016   Generalized headaches 02/18/2020   GERD (gastroesophageal reflux disease) 02/18/2020   History of colonic polyps 03/20/2015   Selmer Adduci 2016 diverticulosis single 5mm polyp repeat 03/2020     History of tubal ligation 09/17/2021   Hyperlipidemia 02/20/2015   AHA 10 year risk of 3.5% April 2016  No access to HDL total cholesterol 238 88/7981. Pt would like to continue lifestyle changes and recheck in 6 months to determine  statin usage.      Irritation of ear, bilateral 03/09/2020   Lateral epicondylitis of elbow 08/18/2022   Pth has been using rt hand has tenosynovitis radial aspect of hand and also epicondylitis of rt elbow.,  Tendero on compression of epicondyles.  Pt has been using rt side instead of left having had a sprain left wrist at work.     Low back pain 08/18/2022   Mild intermittent asthma without complication 07/31/2018   NAFLD (nonalcoholic fatty liver disease) 90/89/7975   No blood products 03/28/2013   [employer]: Signed papers scanned Lyrick Lagrand 2014 into EMR     Numbness and tingling of both feet 09/02/2016   Osteoarthritis of first metatarsophalangeal joint 04/11/2014   Osteoarthritis of knee 09/17/2021   Other allergic rhinitis 01/14/2015   Overweight 08/18/2022   Patient instructed by provider to lose weight before tubal reversal.     Palpitations 03/09/2020   Periodontitis    Post-menopausal bleeding 05/21/2018   Pre-op evaluation 02/21/2024   Prediabetes 10/17/2014   Preop cardiovascular exam    Primary osteoarthritis of both knees    Sensory neuronopathy 07/29/2018   Type 2 diabetes mellitus without complication, without long-term current use of insulin (HCC) 02/22/2024   Urticaria    Vaccine counseling 04/09/2020   Vaginal dryness, menopausal 03/01/2017    Past Surgical History:  Procedure Laterality Date   TUBAL LIGATION  12/26/1987    Current Outpatient Medications  Medication Sig Dispense Refill   amLODipine  (NORVASC ) 5 MG tablet Take 1 tablet (5 mg total) by mouth daily. 90 tablet 1   hyoscyamine  (LEVSIN  SL) 0.125 MG SL tablet Place 1 tablet (0.125 mg total) under the tongue every 12 (twelve) hours as needed for cramping. 30 tablet 0   Na Sulfate-K Sulfate-Mg Sulfate concentrate (SUPREP) 17.5-3.13-1.6 GM/177ML SOLN Take 1 kit (354 mLs total) by mouth once for 1 dose. 354 mL 0   acetaminophen (TYLENOL) 500 MG tablet Take 500 mg by mouth every 6 (six) hours as needed.      cefdinir  (OMNICEF ) 300 MG capsule Take 300 mg by mouth 2 (two) times daily. (Patient not taking: Reported on 10/21/2024)     cetirizine  (ZYRTEC ) 10 MG tablet Take 1 tablet (10 mg total) by mouth daily. 30 tablet 2   chlorhexidine  (PERIDEX ) 0.12 % solution Use as directed 15 mLs in the mouth or throat 2 (two) times daily. 473 mL 1   Continuous Glucose Sensor (FREESTYLE LIBRE 3 PLUS SENSOR) MISC Change sensor every 15 days. 1 each 6   Nutritional Supplements (IMMUNOCAL PO) Take 1 tablet by mouth daily.     omeprazole  (PRILOSEC) 20 MG capsule Take 1 capsule (20 mg total) by mouth daily. 30 capsule 2   No current facility-administered medications for this visit.    Allergies as of 10/22/2024 - Review Complete 10/22/2024  Allergen Reaction Noted   Azithromycin  Nausea And Vomiting 07/08/2015   Clindamycin /lincomycin Hives  02/06/2013   Erythromycin Hives and Nausea And Vomiting 02/02/2005   Rosuvastatin  09/04/2023    Family History  Problem Relation Age of Onset   Arthritis Mother    Hypertension Mother    CAD Father        MI in his 59s   Allergic rhinitis Father    Urticaria Sister    Colon cancer Sister    Allergic rhinitis Son    Asthma Son    Angioedema Neg Hx    Eczema Neg Hx    Immunodeficiency Neg Hx    Esophageal cancer Neg Hx    Stomach cancer Neg Hx    Rectal cancer Neg Hx     Review of Systems:    Constitutional: No weight loss, fever, chills, weakness or fatigue HEENT: Eyes: No change in vision               Ears, Nose, Throat:  No change in hearing or congestion Skin: No rash or itching Cardiovascular: No chest pain, chest pressure or palpitations   Respiratory: No SOB or cough Gastrointestinal: See HPI and otherwise negative Genitourinary: No dysuria or change in urinary frequency Neurological: No headache, dizziness or syncope Musculoskeletal: No new muscle or joint pain Hematologic: No bleeding or bruising Psychiatric: No history of depression or  anxiety    Physical Exam:  Vital signs: Ht 5' 3 (1.6 m)   Wt 182 lb (82.6 kg)   LMP 09/11/2017   BMI 32.24 kg/m   Constitutional:   Pleasant  female appears to be in NAD, Well developed, Well nourished, alert and cooperative Eyes:   PEERL, EOMI. No icterus. Conjunctiva pink. Neck:  Supple Throat: Oral cavity and pharynx without inflammation, swelling or lesion.  Respiratory: Respirations even and unlabored. Lungs clear to auscultation bilaterally.   No wheezes, crackles, or rhonchi.  Cardiovascular: Normal S1, S2. Regular rate and rhythm. No peripheral edema, cyanosis or pallor.  Gastrointestinal:  Soft, nondistended, generalized abdominal tenderness. No rebound or guarding. Normal bowel sounds. No appreciable masses or hepatomegaly. Rectal:  Not performed.  Msk:  Symmetrical without gross deformities. Without edema, no deformity or joint abnormality.  Neurologic:  Alert and  oriented x4;  grossly normal neurologically.  Skin:   Dry and intact without significant lesions or rashes.  RELEVANT LABS AND IMAGING: CBC    Latest Ref Rng & Units 10/20/2024   11:43 AM 08/27/2024    3:03 PM 05/08/2024    1:24 PM  CBC  WBC 3.4 - 10.8 x10E3/uL 5.5  5.3  6.8   Hemoglobin 11.1 - 15.9 g/dL 87.3  87.1  86.6   Hematocrit 34.0 - 46.6 % 39.7  39.8  41.6   Platelets 150 - 450 x10E3/uL 385  444  419      CMP     Latest Ref Rng & Units 10/20/2024   11:43 AM 07/11/2024    4:07 PM 06/05/2024    2:11 PM  CMP  Glucose 70 - 99 mg/dL 895  90  888   BUN 8 - 27 mg/dL 14  12  12    Creatinine 0.57 - 1.00 mg/dL 9.05  9.49  9.12   Sodium 134 - 144 mmol/L 140  139  142   Potassium 3.5 - 5.2 mmol/L 4.1  5.1  4.3   Chloride 96 - 106 mmol/L 100  97  99   CO2 20 - 29 mmol/L 25  26  28    Calcium 8.7 - 10.3 mg/dL 89.3  89.8  9.8   Total Protein 6.0 - 8.5 g/dL 6.8     Total Bilirubin 0.0 - 1.2 mg/dL 0.4     Alkaline Phos 49 - 135 IU/L 100     AST 0 - 40 IU/L 13     ALT 0 - 32 IU/L 21        Lab Results   Component Value Date   TSH 2.72 03/17/2022   02/2024 echo- Left ventricular ejection fraction, by estimation, is 55 to 60%.   GI procedures: 04/2019 EGD - Normal esophagus. - Z- line regular, 40 cm from the incisors.- Gastroesophageal flap valve classified as Hill Grade II ( fold present, opens with respiration) . - Gastritis. Biopsied. - Normal incisura, antrum and pylorus. - Normal duodenal bulb, first portion of the duodenum and second portion of the duodenum. Biopsied.  06/11/2020 colonoscopy at [facility] per patient normal   05/09/2024 ultrasound abdomen complete IMPRESSION: *Increased hepatic echogenicity, a nonspecific finding that is most commonly seen on the basis of steatosis in the absence of known liver disease. Otherwise unremarkable exam.  08/12/24 CTAP IMPRESSION:  1. Findings suggestive of a short segment colitis at the splenic flexure, including infectious, inflammatory, or ischemic etiologies. Diverticulitis could be considered however given appearance, this is felt less likely.  2.  No evidence of active gastrointestinal bleeding.    Fibrosis 4 Score = .45 (Low risk)        Interpretation for patients with NAFLD          <1.30       -  F0-F1 (Low risk)          1.30-2.67 -  Indeterminate           >2.67      -  F3-F4 (High risk)     Validated for ages 32-65         Assessment: Encounter Diagnoses  Name Primary?   Abnormal CT of the abdomen Yes   Nonspecific colitis    Abdominal discomfort    Altered bowel habits    Rectal bleeding    Gastroesophageal reflux disease, unspecified whether esophagitis present    Abdominal pain, epigastric    Hepatic steatosis      63 year old female patient that presents for follow-up after ED visit back in October where CT scan showed findings suggestive of short segment colitis at the splenic flexure.  Patient denies any known triggers at that time however presents today with generalized abdominal pain.  Patient states she  initially had diarrhea that has since then resolved.  Patient also reports she was having rectal bleeding at the time that has also resolved.  Patient's last colonoscopy was in August 2021 at [facility] and per patient was normal.  Patient also reports history of GERD and recently ran out of her antiacid medication and the next day patient had generalized abdominal pain that led her to the ED.  Workup was negative.  Patient was restarted on PPI therapy however states the abdominal pain has continued.  Pending abdominal ultrasound on December 18th.  Abdominal ultrasound from July 2025 showed fatty liver otherwise normal exam.  No gallstones or CBD dilation.  Recent labs normal including lipase.  Last endoscopy was in 2020 with Dr. San which showed gastritis and duodenitis with negative biopsy. Will schedule upper GI endoscopy to evaluate uncontrolled GERD with epigastric pain along with colonoscopy to evaluate abnormal CT scan showing colitis. Will provide hyoscyamine  for abdominal pain prn along with low fodmap diet.  History of fatty liver, recent abdominal u/s (05/2024) with low risk fib 4 score. Normal LFTs. Will continue to monitor every 6 mths.  Plan: - hyoscyamine  0.125mg  SL prn abdominal pain  -recommend low fodmap diet Schedule EGD in [facility] with Dr. San. The risks and benefits of EGD with possible biopsies and esophageal dilation were discussed with the patient who agrees to proceed. -Schedule for a colonoscopy in [facility] with Dr. San The risks and benefits of colonoscopy with possible polypectomy / biopsies were discussed and the patient agrees to proceed.    Thank you for the courtesy of this consult. Please call me with any questions or concerns.   Amyrah Pinkhasov, FNP-C [facility] 10/22/2024, 4:39 PM  Cc: Booker Darice SAUNDERS, FNP

## 2024-10-22 NOTE — Patient Instructions (Addendum)
 Abdominal pain Hyoscyamine  as needed for abdominal pain Recommend low fodmap diet  GERD Recommend GERD diet Continue omeprazole  20 mg po daily    Hepatic steatosis Recommend mediterranean diet Exercise as tolerated Maintain healthy weight  We have sent the following medications to your pharmacy for you to pick up at your convenience: SUPREP  You have been scheduled for an endoscopy and colonoscopy. Please follow the written instructions given to you at your visit today.  If you use inhalers (even only as needed), please bring them with you on the day of your procedure.  DO NOT TAKE 7 DAYS PRIOR TO TEST- Trulicity (dulaglutide) Ozempic, Wegovy (semaglutide) Mounjaro, Zepbound (tirzepatide) Bydureon Bcise (exanatide extended release)  DO NOT TAKE 1 DAY PRIOR TO YOUR TEST Rybelsus (semaglutide) Adlyxin (lixisenatide) Victoza (liraglutide) Byetta (exanatide) ___________________________________________________________________________  Due to recent changes in healthcare laws, you may see the results of your imaging and laboratory studies on MyChart before your provider has had a chance to review them.  We understand that in some cases there may be results that are confusing or concerning to you. Not all laboratory results come back in the same time frame and the provider may be waiting for multiple results in order to interpret others.  Please give us  48 hours in order for your provider to thoroughly review all the results before contacting the office for clarification of your results.   _______________________________________________________  If your blood pressure at your visit was 140/90 or greater, please contact your primary care physician to follow up on this.  _______________________________________________________  If you are age 61 or older, your body mass index should be between 23-30. Your Body mass index is 32.24 kg/m. If this is out of the aforementioned range  listed, please consider follow up with your Primary Care Provider.  If you are age 30 or younger, your body mass index should be between 19-25. Your Body mass index is 32.24 kg/m. If this is out of the aformentioned range listed, please consider follow up with your Primary Care Provider.   ________________________________________________________  The [facility] providers would like to encourage you to use MYCHART to communicate with providers for non-urgent requests or questions.  Due to long hold times on the telephone, sending your provider a message by [facility] may be a faster and more efficient way to get a response.  Please allow 48 business hours for a response.  Please remember that this is for non-urgent requests.  _______________________________________________________  [facility] is using a team-based approach to care.  Your team is made up of your doctor and two to three APPS. Our APPS (Nurse Practitioners and Physician Assistants) work with your physician to ensure care continuity for you. They are fully qualified to address your health concerns and develop a treatment plan. They communicate directly with your gastroenterologist to care for you. Seeing the Advanced Practice Practitioners on your physician's team can help you by facilitating care more promptly, often allowing for earlier appointments, access to diagnostic testing, procedures, and other specialty referrals.   Thank you for trusting me with your gastrointestinal care. Deanna May, FNP-C

## 2024-10-22 NOTE — Addendum Note (Signed)
 Addended by: Jihad Brownlow E on: 10/22/2024 04:46 PM   Modules accepted: Orders

## 2024-10-24 ENCOUNTER — Ambulatory Visit

## 2024-10-24 ENCOUNTER — Other Ambulatory Visit

## 2024-10-24 DIAGNOSIS — R1084 Generalized abdominal pain: Secondary | ICD-10-CM

## 2024-10-24 DIAGNOSIS — Z1231 Encounter for screening mammogram for malignant neoplasm of breast: Secondary | ICD-10-CM

## 2024-10-29 ENCOUNTER — Ambulatory Visit: Payer: Self-pay | Admitting: Family Medicine

## 2024-11-04 ENCOUNTER — Encounter: Payer: Self-pay | Admitting: Family Medicine

## 2024-11-06 ENCOUNTER — Other Ambulatory Visit: Payer: Self-pay | Admitting: Family Medicine

## 2024-11-06 DIAGNOSIS — E119 Type 2 diabetes mellitus without complications: Secondary | ICD-10-CM

## 2024-11-07 NOTE — Progress Notes (Signed)
 Agree with the assessment and plan as outlined by [facility], FNP-C.  Janet Bottino, DO, [facility]

## 2024-11-14 ENCOUNTER — Other Ambulatory Visit: Payer: Self-pay | Admitting: Family Medicine

## 2024-11-14 ENCOUNTER — Telehealth: Payer: Self-pay

## 2024-11-14 DIAGNOSIS — K0530 Chronic periodontitis, unspecified: Secondary | ICD-10-CM

## 2024-11-14 MED ORDER — AMOXICILLIN-POT CLAVULANATE 875-125 MG PO TABS: 1.0000 | ORAL_TABLET | Freq: Two times a day (BID) | ORAL | 0 refills | Status: AC

## 2024-11-14 NOTE — Telephone Encounter (Signed)
 Patient states that this is for her gums again. She still has not found a dentist.  Janet Richards

## 2024-11-14 NOTE — Telephone Encounter (Signed)
 Patient is requesting a phone call from you. She stated that she needs a antibiotic. I told her that she has to be seen, or she could mychart you and she stated that she didn't have a mychart it was hacked.

## 2024-11-19 ENCOUNTER — Telehealth: Payer: Self-pay | Admitting: Gastroenterology

## 2024-11-19 NOTE — Telephone Encounter (Signed)
 Call from pt regarding tomorrows colonoscopy. Pt has question about what to eat with her medication. Please advise. Thank you.

## 2024-11-19 NOTE — Telephone Encounter (Signed)
 Called pt back to inform her MD said we can proceed with EGD. She verbalized understanding.

## 2024-11-19 NOTE — Telephone Encounter (Signed)
 Pt called stating she was recently started on antibiotics for inflammation of her gums on 11/15/24. Denies abscess or loose teeth. Reports she has a hx of recurrent peridentitis. Currently on Augmentin  which she started on 11/16/24. Asking if she is okay to proceed with EGD, as she is scheduled for and ENDO/Colon. MD please advise.

## 2024-11-19 NOTE — Telephone Encounter (Signed)
 Proceed with Dr. San as planned

## 2024-11-20 ENCOUNTER — Ambulatory Visit: Admitting: Gastroenterology

## 2024-11-20 ENCOUNTER — Encounter: Payer: Self-pay | Admitting: Gastroenterology

## 2024-11-20 VITALS — BP 118/84 | HR 83 | Temp 97.6°F | Resp 15 | Ht 63.0 in | Wt 182.0 lb

## 2024-11-20 DIAGNOSIS — K2950 Unspecified chronic gastritis without bleeding: Secondary | ICD-10-CM

## 2024-11-20 DIAGNOSIS — K921 Melena: Secondary | ICD-10-CM

## 2024-11-20 DIAGNOSIS — R1013 Epigastric pain: Secondary | ICD-10-CM

## 2024-11-20 DIAGNOSIS — D123 Benign neoplasm of transverse colon: Secondary | ICD-10-CM

## 2024-11-20 DIAGNOSIS — K635 Polyp of colon: Secondary | ICD-10-CM | POA: Diagnosis not present

## 2024-11-20 DIAGNOSIS — R1084 Generalized abdominal pain: Secondary | ICD-10-CM

## 2024-11-20 DIAGNOSIS — K5730 Diverticulosis of large intestine without perforation or abscess without bleeding: Secondary | ICD-10-CM | POA: Diagnosis not present

## 2024-11-20 DIAGNOSIS — K625 Hemorrhage of anus and rectum: Secondary | ICD-10-CM

## 2024-11-20 DIAGNOSIS — K219 Gastro-esophageal reflux disease without esophagitis: Secondary | ICD-10-CM | POA: Diagnosis not present

## 2024-11-20 DIAGNOSIS — R935 Abnormal findings on diagnostic imaging of other abdominal regions, including retroperitoneum: Secondary | ICD-10-CM

## 2024-11-20 MED ORDER — SODIUM CHLORIDE 0.9 % IV SOLN: 500.0000 mL | Freq: Once | INTRAVENOUS | Status: DC

## 2024-11-20 NOTE — Progress Notes (Signed)
 "   GASTROENTEROLOGY PROCEDURE H&P NOTE   Primary Care Physician: Booker Darice SAUNDERS, FNP    Reason for Procedure:  Abnormal CT, hematochezia, generalized abdominal pain, change in bowel habits, GERD, history of gastritis  Plan:    EGD, colonoscopy  Patient is appropriate for endoscopic procedure(s) in the ambulatory ([facility]) setting.  The nature of the procedure, as well as the risks, benefits, and alternatives were carefully and thoroughly reviewed with the patient. Ample time for discussion and questions allowed. The patient understood, was satisfied, and agreed to proceed. I personally addressed all patient questions and concerns.     HPI: Janet Richards is a 64 y.o. female who presents for EGD and colonoscopy for evaluation of change in bowel habits, hematochezia, generalized abdominal pain, recent CT showing colitis of the splenic flexure, along with history of gastritis and GERD.  Patient was most recently seen in the Gastroenterology Clinic on 10/22/2024.  No interval change in medical history since that appointment. Please refer to that note for full details regarding GI history and clinical presentation.   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  Patient instructed by provider to lose weight before tubal reversal.     Last Assessment & Plan:   Formatting of this note might be different from the original.   Obesity is complicated by hyperlipidemia, hypertension and prediabetes.     Continue healthy cardiac diet and exercise.      Contact dermatitis 08/18/2022   DDD (degenerative disc disease), cervical 02/22/2018   Dermatitis due to drugs and medicines taken internally 08/18/2022   Dyspepsia 07/31/2018   Dysuria 09/16/2019   Eczema 08/18/2022   Ptd prescribed elidel.     Encounter for screening for metabolic disorder 01/11/2024   Environmental allergies 09/12/2018   Mold, trees, dust mites, mildew. 2019     Essential hypertension 03/01/2013   Essential hypertension, benign    Exposure to mold 01/11/2024   Ganglion cyst of right foot 07/12/2016   Generalized headaches 02/18/2020   GERD (gastroesophageal reflux disease) 02/18/2020   History of colonic polyps 03/20/2015   May 2016 diverticulosis single 5mm polyp repeat 03/2020     History of tubal ligation 09/17/2021   Hyperlipidemia 02/20/2015   AHA 10 year risk of 3.5% April 2016  No access to HDL total cholesterol 238 88/7981. Pt would like to continue lifestyle changes and recheck in 6 months to determine statin usage.      Irritation of ear, bilateral 03/09/2020   Lateral epicondylitis of elbow 08/18/2022   Pth has been using rt hand has tenosynovitis radial aspect of hand and also epicondylitis of rt elbow.,  Tendero on compression of epicondyles.  Pt has been using rt side instead of left having had a sprain left wrist at work.     Low back pain 08/18/2022   Mild intermittent asthma without complication 07/31/2018   NAFLD (nonalcoholic fatty liver disease) 90/89/7975   No blood products 03/28/2013   [employer]: Signed  papers scanned May 2014 into EMR     Numbness and tingling of both feet 09/02/2016   Osteoarthritis of first metatarsophalangeal joint 04/11/2014   Osteoarthritis of knee 09/17/2021   Other allergic rhinitis 01/14/2015   Overweight 08/18/2022   Patient instructed by provider to lose weight before tubal reversal.     Palpitations 03/09/2020   Periodontitis    Post-menopausal bleeding 05/21/2018   Pre-op evaluation 02/21/2024    Prediabetes 10/17/2014   Preop cardiovascular exam    Primary osteoarthritis of both knees    Sensory neuronopathy 07/29/2018   Type 2 diabetes mellitus without complication, without long-term current use of insulin (HCC) 02/22/2024   Urticaria    Vaccine counseling 04/09/2020   Vaginal dryness, menopausal 03/01/2017    Past Surgical History:  Procedure Laterality Date   TUBAL LIGATION  12/26/1987    Prior to Admission medications  Medication Sig Start Date End Date Taking? Authorizing Provider  acetaminophen (TYLENOL) 500 MG tablet Take 500 mg by mouth every 6 (six) hours as needed.    [provider]  amLODipine  (NORVASC ) 5 MG tablet Take 1 tablet (5 mg total) by mouth daily. 08/27/24 11/25/24  Booker Darice SAUNDERS, FNP  amoxicillin -clavulanate (AUGMENTIN ) 875-125 MG tablet Take 1 tablet by mouth 2 (two) times daily. 11/14/24   Booker Darice SAUNDERS, FNP  cetirizine  (ZYRTEC ) 10 MG tablet Take 1 tablet (10 mg total) by mouth daily. 07/16/24 10/22/24  Joesph Shaver Scales, PA-C  chlorhexidine  (PERIDEX ) 0.12 % solution Use as directed 15 mLs in the mouth or throat 2 (two) times daily. 10/21/24   Booker Darice SAUNDERS, FNP  Continuous Glucose Sensor (FREESTYLE LIBRE 3 PLUS SENSOR) MISC Change sensor every 15 days. 11/06/24   Booker Darice SAUNDERS, FNP  hyoscyamine  (LEVSIN  SL) 0.125 MG SL tablet Place 1 tablet (0.125 mg total) under the tongue every 12 (twelve) hours as needed for cramping. 10/22/24   May, Deanna J, NP  Nutritional Supplements (IMMUNOCAL PO) Take 1 tablet by mouth daily.    [provider]  omeprazole  (PRILOSEC) 20 MG capsule Take 1 capsule (20 mg total) by mouth daily. 10/20/24 11/19/24  Hermanns, Ashlee P, PA-C    Current Outpatient Medications  Medication Sig Dispense Refill   acetaminophen (TYLENOL) 500 MG tablet Take 500 mg by mouth every 6 (six) hours as needed.     amLODipine  (NORVASC ) 5 MG tablet Take 1 tablet (5 mg total) by mouth daily. 90 tablet 1   amoxicillin -clavulanate  (AUGMENTIN ) 875-125 MG tablet Take 1 tablet by mouth 2 (two) times daily. 14 tablet 0   cetirizine  (ZYRTEC ) 10 MG tablet Take 1 tablet (10 mg total) by mouth daily. 30 tablet 2   chlorhexidine  (PERIDEX ) 0.12 % solution Use as directed 15 mLs in the mouth or throat 2 (two) times daily. 473 mL 1   Continuous Glucose Sensor (FREESTYLE LIBRE 3 PLUS SENSOR) MISC Change sensor every 15 days. 1 each 6   hyoscyamine  (LEVSIN  SL) 0.125 MG SL tablet Place 1 tablet (0.125 mg total) under the tongue every 12 (twelve) hours as needed for cramping. 30 tablet 0   Nutritional Supplements (IMMUNOCAL PO) Take 1 tablet by mouth daily.     omeprazole  (PRILOSEC) 20 MG capsule Take 1 capsule (20 mg total) by mouth daily. 30 capsule 2   Current Facility-Administered Medications  Medication Dose Route Frequency Provider Last Rate Last Admin   0.9 %  sodium chloride  infusion  500 mL Intravenous Once Mackay Hanauer V, DO  Allergies as of 11/20/2024 - Review Complete 10/22/2024  Allergen Reaction Noted   Azithromycin  Nausea And Vomiting 07/08/2015   Clindamycin /lincomycin Hives 02/06/2013   Erythromycin Hives and Nausea And Vomiting 02/02/2005   Rosuvastatin  09/04/2023    Family History  Problem Relation Age of Onset   Arthritis Mother    Hypertension Mother    CAD Father        MI in his 63s   Allergic rhinitis Father    Urticaria Sister    Colon cancer Sister    Allergic rhinitis Son    Asthma Son    Angioedema Neg Hx    Eczema Neg Hx    Immunodeficiency Neg Hx    Esophageal cancer Neg Hx    Stomach cancer Neg Hx    Rectal cancer Neg Hx     Social History   Socioeconomic History   Marital status: Married    Spouse name: Not on file   Number of children: 3   Years of education: Not on file   Highest education level: Not on file  Occupational History   Occupation: homemaker  Tobacco Use   Smoking status: Former    Current packs/day: 0.00    Average packs/day: 1.5 packs/day     Types: Cigarettes    Quit date: 02/05/2006    Years since quitting: 18.8    Passive exposure: Never   Smokeless tobacco: Never  Vaping Use   Vaping status: Never Used  Substance and Sexual Activity   Alcohol use: Not Currently   Drug use: No   Sexual activity: Yes    Partners: Male  Other Topics Concern   Not on file  Social History Narrative   ** Merged History Encounter **       Social Drivers of Health   Tobacco Use: Medium Risk (10/22/2024)   Patient History    Smoking Tobacco Use: Former    Smokeless Tobacco Use: Never    Passive Exposure: Never  Physicist, Medical Strain: Low Risk (04/18/2023)   Received from [facility]   Overall Financial Resource Strain (CARDIA)    Difficulty of Paying Living Expenses: Not hard at all  Food Insecurity: Low Risk (07/06/2024)   Received from [facility]   Epic    Within the past 12 months, you worried that your food would run out before you got money to buy more: Never true    Within the past 12 months, the food you bought just didn't last and you didn't have money to get more. : Never true  Transportation Needs: No Transportation Needs (07/06/2024)   Received from [facility]    In the past 12 months, has lack of reliable transportation kept you from medical appointments, meetings, work or from getting things needed for daily living? : No  Physical Activity: Unknown (04/18/2023)   Received from [facility]   Exercise Vital Sign    On average, how many days per week do you engage in moderate to strenuous exercise (like a brisk walk)?: 0 days    Minutes of Exercise per Session: Not on file  Stress: No Stress Concern Present (04/18/2023)   Received from [facility] - Occupational Stress Questionnaire    Feeling of Stress : Not at all  Social Connections: Socially Integrated (04/18/2023)   Received from [facility]   Social Network    How would you rate your social  network (family, work, friends)?: Good participation  with social networks  Intimate Partner Violence: Not At Risk (06/29/2024)   Received from [facility]   HITS    Over the last 12 months how often did your partner physically hurt you?: Never    Over the last 12 months how often did your partner insult you or talk down to you?: Never    Over the last 12 months how often did your partner threaten you with physical harm?: Never    Over the last 12 months how often did your partner scream or curse at you?: Never  Depression (PHQ2-9): Low Risk (08/27/2024)   Depression (PHQ2-9)    PHQ-2 Score: 0  Recent Concern: Depression (PHQ2-9) - Medium Risk (07/01/2024)   Depression (PHQ2-9)    PHQ-2 Score: 6  Alcohol Screen: Not on file  Housing: Low Risk (08/13/2024)   Received from [facility]   Epic    What is your living situation today?: I have a steady place to live    Think about the place you live. Do you have problems with any of the following? Choose all that apply:: None/None on this list  Utilities: Medium Risk (07/06/2024)   Received from [facility]   Utilities    In the past 12 months has the electric, gas, oil, or water company threatened to shut off services in your home? : Yes  Health Literacy: Not on file    Physical Exam: Vital signs in last 24 hours: @BP  (!) 145/82   Pulse 88   Temp 97.6 F (36.4 C) (Skin)   Ht 5' 3 (1.6 m)   Wt 182 lb (82.6 kg)   LMP 09/11/2017   SpO2 100%   BMI 32.24 kg/m  GEN: NAD EYE: Sclerae anicteric ENT: MMM CV: Non-tachycardic Pulm: CTA b/l GI: Soft, NT/ND NEURO:  Alert & Oriented x 3   Sandor Flatter, DO [facility]   11/20/2024 2:50 PM  "

## 2024-11-20 NOTE — Patient Instructions (Signed)
 Handouts provided on polyps and diverticulosis.  Resume previous diet.  Continue present medications.  Await pathology results.  Repeat colonoscopy for surveillance based on pathology results.  Return to GI office as needed.   YOU HAD AN ENDOSCOPIC PROCEDURE TODAY AT THE [facility]:   Refer to the procedure report that was given to you for any specific questions about what was found during the examination.  If the procedure report does not answer your questions, please call your gastroenterologist to clarify.  If you requested that your care partner not be given the details of your procedure findings, then the procedure report has been included in a sealed envelope for you to review at your convenience later.  YOU SHOULD EXPECT: Some feelings of bloating in the abdomen. Passage of more gas than usual.  Walking can help get rid of the air that was put into your GI tract during the procedure and reduce the bloating. If you had a lower endoscopy (such as a colonoscopy or flexible sigmoidoscopy) you may notice spotting of blood in your stool or on the toilet paper. If you underwent a bowel prep for your procedure, you may not have a normal bowel movement for a few days.  Please Note:  You might notice some irritation and congestion in your nose or some drainage.  This is from the oxygen used during your procedure.  There is no need for concern and it should clear up in a day or so.  SYMPTOMS TO REPORT IMMEDIATELY:  Following lower endoscopy (colonoscopy or flexible sigmoidoscopy):  Excessive amounts of blood in the stool  Significant tenderness or worsening of abdominal pains  Swelling of the abdomen that is new, acute  Fever of 100F or higher  Following upper endoscopy (EGD)  Vomiting of blood or coffee ground material  New chest pain or pain under the shoulder blades  Painful or persistently difficult swallowing  New shortness of breath  Fever of 100F or higher  Black,  tarry-looking stools  For urgent or emergent issues, a gastroenterologist can be reached at any hour by calling [phone]. Do not use MyChart messaging for urgent concerns.    DIET:  We do recommend a small meal at first, but then you may proceed to your regular diet.  Drink plenty of fluids but you should avoid alcoholic beverages for 24 hours.  ACTIVITY:  You should plan to take it easy for the rest of today and you should NOT DRIVE or use heavy machinery until tomorrow (because of the sedation medicines used during the test).    FOLLOW UP: Our staff will call the number listed on your records the next business day following your procedure.  We will call around 7:15- 8:00 am to check on you and address any questions or concerns that you may have regarding the information given to you following your procedure. If we do not reach you, we will leave a message.     If any biopsies were taken you will be contacted by phone or by letter within the next 1-3 weeks.  Please call us  at [phone] if you have not heard about the biopsies in 3 weeks.    SIGNATURES/CONFIDENTIALITY: You and/or your care partner have signed paperwork which will be entered into your electronic medical record.  These signatures attest to the fact that that the information above on your After Visit Summary has been reviewed and is understood.  Full responsibility of the confidentiality of this discharge information lies  with you and/or your care-partner.

## 2024-11-20 NOTE — Progress Notes (Signed)
 VS by EJ  Pt's states no medical or surgical changes since previsit or office visit.

## 2024-11-20 NOTE — Progress Notes (Signed)
 Called to room to assist during endoscopic procedure.  Patient ID and intended procedure confirmed with present staff. Received instructions for my participation in the procedure from the performing physician.

## 2024-11-20 NOTE — Op Note (Signed)
 [facility] Patient Name: Janet Richards Procedure Date: 11/20/2024 3:10 PM MRN: [id number] Endoscopist: Sandor Flatter , MD, [id number] Age: 64 Referring MD:  Date of Birth: 12-Mar-1961 Gender: Female Account #: [id number] Procedure:                Colonoscopy Indications:              Hematochezia, Abnormal CT of the GI tract, Change                            in bowel habits Medicines:                Monitored Anesthesia Care Procedure:                Pre-Anesthesia Assessment:                           - Prior to the procedure, a History and Physical                            was performed, and patient medications and                            allergies were reviewed. The patient's tolerance of                            previous anesthesia was also reviewed. The risks                            and benefits of the procedure and the sedation                            options and risks were discussed with the patient.                            All questions were answered, and informed consent                            was obtained. Prior Anticoagulants: The patient has                            taken no anticoagulant or antiplatelet agents. ASA                            Grade Assessment: II - A patient with mild systemic                            disease. After reviewing the risks and benefits,                            the patient was deemed in satisfactory condition to                            undergo the procedure.  After obtaining informed consent, the colonoscope                            was passed under direct vision. Throughout the                            procedure, the patient's blood pressure, pulse, and                            oxygen saturations were monitored continuously. The                            Colonoscope was introduced through the anus and                            advanced to the the terminal ileum. The  colonoscopy                            was performed without difficulty. The patient                            tolerated the procedure well. The quality of the                            bowel preparation was good. The terminal ileum,                            ileocecal valve, appendiceal orifice, and rectum                            were photographed. Scope In: 3:20:43 PM Scope Out: 3:32:15 PM Scope Withdrawal Time: 0 hours 8 minutes 57 seconds  Total Procedure Duration: 0 hours 11 minutes 32 seconds  Findings:                 The perianal and digital rectal examinations were                            normal.                           Two sessile polyps were found in the transverse                            colon. The polyps were 2 to 4 mm in size. These                            polyps were removed with a cold snare. Resection                            and retrieval were complete. Estimated blood loss                            was minimal.  Multiple large-mouthed and small-mouthed                            diverticula were found in the entire colon.                           The retroflexed view of the distal rectum and anal                            verge was normal and showed no anal or rectal                            abnormalities.                           The terminal ileum appeared normal. Complications:            No immediate complications. Estimated Blood Loss:     Estimated blood loss was minimal. Impression:               - Two 2 to 4 mm polyps in the transverse colon,                            removed with a cold snare. Resected and retrieved.                           - Diverticulosis in the entire examined colon.                           - The distal rectum and anal verge are normal on                            retroflexion view.                           - The examined portion of the ileum was normal.                           -  The mucosa throughout the remainder of the colon                            was otherwise normal-appearing. No areas of mucosal                            erythema, edema, erosions, or ulceration noted on                            this study. Recommendation:           - Patient has a contact number available for                            emergencies. The signs and symptoms of potential  delayed complications were discussed with the                            patient. Return to normal activities tomorrow.                            Written discharge instructions were provided to the                            patient.                           - Resume previous diet.                           - Continue present medications.                           - Await pathology results.                           - Repeat colonoscopy for surveillance based on                            pathology results.                           - Return to GI office PRN. Sandor Flatter, MD 11/20/2024 3:41:18 PM

## 2024-11-20 NOTE — Op Note (Signed)
 [facility] Patient Name: Janet Richards Procedure Date: 11/20/2024 3:11 PM MRN: [id number] Endoscopist: Sandor Flatter , MD, [id number] Age: 64 Referring MD:  Date of Birth: 25-Nov-1960 Gender: Female Account #: [id number] Procedure:                Upper GI endoscopy Indications:              Epigastric abdominal pain, Generalized abdominal                            pain, Esophageal reflux, Abnormal CT of the GI                            tract, Follow-up of gastritis Medicines:                Monitored Anesthesia Care Procedure:                Pre-Anesthesia Assessment:                           - Prior to the procedure, a History and Physical                            was performed, and patient medications and                            allergies were reviewed. The patient's tolerance of                            previous anesthesia was also reviewed. The risks                            and benefits of the procedure and the sedation                            options and risks were discussed with the patient.                            All questions were answered, and informed consent                            was obtained. Prior Anticoagulants: The patient has                            taken no anticoagulant or antiplatelet agents. ASA                            Grade Assessment: II - A patient with mild systemic                            disease. After reviewing the risks and benefits,                            the patient was deemed in satisfactory condition to  undergo the procedure.                           After obtaining informed consent, the endoscope was                            passed under direct vision. Throughout the                            procedure, the patient's blood pressure, pulse, and                            oxygen saturations were monitored continuously. The                            Olympus Scope SN [id number]  was introduced through the                            mouth, and advanced to the second part of duodenum.                            The upper GI endoscopy was accomplished without                            difficulty. The patient tolerated the procedure                            well. Scope In: Scope Out: Findings:                 The examined esophagus was normal.                           The Z-line was regular and was found 38 cm from the                            incisors.                           The entire examined stomach was normal. Biopsies                            were taken with a cold forceps for Helicobacter                            pylori testing. Estimated blood loss was minimal.                           The examined duodenum was normal. Complications:            No immediate complications. Estimated Blood Loss:     Estimated blood loss was minimal. Impression:               - Normal esophagus.                           - Z-line  regular, 38 cm from the incisors.                           - Normal stomach. Biopsied.                           - Normal examined duodenum. Recommendation:           - Patient has a contact number available for                            emergencies. The signs and symptoms of potential                            delayed complications were discussed with the                            patient. Return to normal activities tomorrow.                            Written discharge instructions were provided to the                            patient.                           - Resume previous diet.                           - Continue present medications.                           - Await pathology results.                           - Return to GI clinic PRN. Sandor Flatter, MD 11/20/2024 3:37:19 PM

## 2024-11-20 NOTE — Progress Notes (Signed)
 A/o x 3, VSS, good SR's, pleased with anesthesia, report to RN

## 2024-11-21 ENCOUNTER — Telehealth: Payer: Self-pay | Admitting: *Deleted

## 2024-11-21 NOTE — Telephone Encounter (Signed)
" °  Follow up Call-     11/20/2024    2:47 PM  Call back number  Post procedure Call Back phone  # [phone]  Permission to leave phone message Yes     Patient questions:  Do you have a fever, pain , or abdominal swelling? No. Pain Score  0 *  Have you tolerated food without any problems? Yes.    Have you been able to return to your normal activities? Yes.    Do you have any questions about your discharge instructions: Diet   No. Medications  No. Follow up visit  No.  Do you have questions or concerns about your Care? No.  Actions: * If pain score is 4 or above: No action needed, pain <4.   "

## 2024-11-22 ENCOUNTER — Other Ambulatory Visit: Payer: Self-pay | Admitting: Family Medicine

## 2024-11-22 DIAGNOSIS — E119 Type 2 diabetes mellitus without complications: Secondary | ICD-10-CM

## 2024-11-26 ENCOUNTER — Ambulatory Visit: Payer: Self-pay | Admitting: Gastroenterology

## 2024-11-26 LAB — SURGICAL PATHOLOGY

## 2024-11-29 ENCOUNTER — Telehealth: Payer: Self-pay | Admitting: Gastroenterology

## 2024-11-29 NOTE — Telephone Encounter (Signed)
 Patient called back regarding pathology report from EGD/Colonoscopy from 11/20/2024.  No provider comment noted for review. Patient verbalized understanding and all questions were answered. Patient requested for report to be sent to new PCP at [facility] on [address]. Report/pathology faxed to [phone]. EGD report from 04/2019 also mailed to patient. Patient to call back if any further questions or concerns.

## 2024-11-29 NOTE — Telephone Encounter (Signed)
 PT is calling to discuss the results of her pathology reports. Requesting to speak with a nurse. Please advise.

## 2024-12-04 ENCOUNTER — Encounter: Payer: Self-pay | Admitting: Gastroenterology
